# Patient Record
Sex: Female | Born: 1960 | Race: Black or African American | Hispanic: No | Smoking: Current some day smoker
Health system: Southern US, Community
[De-identification: ages and names within clinical notes are randomized; demographics above are authoritative.]

## PROBLEM LIST (undated history)

## (undated) DIAGNOSIS — G4730 Sleep apnea, unspecified: Secondary | ICD-10-CM

## (undated) DIAGNOSIS — R112 Nausea with vomiting, unspecified: Secondary | ICD-10-CM

## (undated) DIAGNOSIS — G43909 Migraine, unspecified, not intractable, without status migrainosus: Secondary | ICD-10-CM

## (undated) DIAGNOSIS — L959 Vasculitis limited to the skin, unspecified: Secondary | ICD-10-CM

## (undated) DIAGNOSIS — I10 Essential (primary) hypertension: Secondary | ICD-10-CM

## (undated) DIAGNOSIS — Z992 Dependence on renal dialysis: Secondary | ICD-10-CM

## (undated) DIAGNOSIS — I677 Cerebral arteritis, not elsewhere classified: Secondary | ICD-10-CM

## (undated) DIAGNOSIS — A159 Respiratory tuberculosis unspecified: Secondary | ICD-10-CM

## (undated) DIAGNOSIS — G459 Transient cerebral ischemic attack, unspecified: Secondary | ICD-10-CM

## (undated) DIAGNOSIS — R569 Unspecified convulsions: Secondary | ICD-10-CM

## (undated) DIAGNOSIS — J449 Chronic obstructive pulmonary disease, unspecified: Secondary | ICD-10-CM

## (undated) DIAGNOSIS — L0390 Cellulitis, unspecified: Secondary | ICD-10-CM

## (undated) DIAGNOSIS — N809 Endometriosis, unspecified: Secondary | ICD-10-CM

## (undated) DIAGNOSIS — M1990 Unspecified osteoarthritis, unspecified site: Secondary | ICD-10-CM

## (undated) DIAGNOSIS — M329 Systemic lupus erythematosus, unspecified: Secondary | ICD-10-CM

## (undated) DIAGNOSIS — N186 End stage renal disease: Secondary | ICD-10-CM

## (undated) DIAGNOSIS — A419 Sepsis, unspecified organism: Secondary | ICD-10-CM

## (undated) DIAGNOSIS — D649 Anemia, unspecified: Secondary | ICD-10-CM

## (undated) DIAGNOSIS — IMO0002 Reserved for concepts with insufficient information to code with codable children: Secondary | ICD-10-CM

## (undated) DIAGNOSIS — I469 Cardiac arrest, cause unspecified: Secondary | ICD-10-CM

## (undated) DIAGNOSIS — R768 Other specified abnormal immunological findings in serum: Secondary | ICD-10-CM

## (undated) DIAGNOSIS — T1491XA Suicide attempt, initial encounter: Secondary | ICD-10-CM

## (undated) DIAGNOSIS — Z9889 Other specified postprocedural states: Secondary | ICD-10-CM

## (undated) DIAGNOSIS — K8020 Calculus of gallbladder without cholecystitis without obstruction: Secondary | ICD-10-CM

## (undated) HISTORY — PX: TUBAL LIGATION: SHX77

## (undated) HISTORY — PX: LUNG BIOPSY: SHX232

---

## 2012-05-31 DIAGNOSIS — Z79899 Other long term (current) drug therapy: Secondary | ICD-10-CM | POA: Insufficient documentation

## 2013-02-21 DIAGNOSIS — G63 Polyneuropathy in diseases classified elsewhere: Secondary | ICD-10-CM | POA: Insufficient documentation

## 2016-06-27 ENCOUNTER — Emergency Department (HOSPITAL_COMMUNITY): Payer: Medicare Other

## 2016-06-27 ENCOUNTER — Encounter (HOSPITAL_COMMUNITY): Payer: Self-pay | Admitting: Emergency Medicine

## 2016-06-27 ENCOUNTER — Inpatient Hospital Stay (HOSPITAL_COMMUNITY)
Admission: EM | Admit: 2016-06-27 | Discharge: 2016-06-29 | DRG: 546 | Disposition: A | Discharge status: Home / Self Care | Payer: Medicare Other | Attending: Family Medicine | Admitting: Family Medicine

## 2016-06-27 DIAGNOSIS — M329 Systemic lupus erythematosus, unspecified: Secondary | ICD-10-CM | POA: Diagnosis present

## 2016-06-27 DIAGNOSIS — K5900 Constipation, unspecified: Secondary | ICD-10-CM | POA: Diagnosis present

## 2016-06-27 DIAGNOSIS — Z79899 Other long term (current) drug therapy: Secondary | ICD-10-CM

## 2016-06-27 DIAGNOSIS — IMO0002 Reserved for concepts with insufficient information to code with codable children: Secondary | ICD-10-CM | POA: Diagnosis present

## 2016-06-27 DIAGNOSIS — E8809 Other disorders of plasma-protein metabolism, not elsewhere classified: Secondary | ICD-10-CM | POA: Diagnosis present

## 2016-06-27 DIAGNOSIS — F17210 Nicotine dependence, cigarettes, uncomplicated: Secondary | ICD-10-CM | POA: Diagnosis present

## 2016-06-27 DIAGNOSIS — Z9119 Patient's noncompliance with other medical treatment and regimen: Secondary | ICD-10-CM

## 2016-06-27 DIAGNOSIS — R109 Unspecified abdominal pain: Secondary | ICD-10-CM | POA: Diagnosis present

## 2016-06-27 DIAGNOSIS — R1084 Generalized abdominal pain: Secondary | ICD-10-CM | POA: Diagnosis present

## 2016-06-27 DIAGNOSIS — N83291 Other ovarian cyst, right side: Secondary | ICD-10-CM | POA: Diagnosis present

## 2016-06-27 DIAGNOSIS — M3214 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: Principal | ICD-10-CM | POA: Diagnosis present

## 2016-06-27 DIAGNOSIS — D649 Anemia, unspecified: Secondary | ICD-10-CM | POA: Diagnosis present

## 2016-06-27 DIAGNOSIS — K429 Umbilical hernia without obstruction or gangrene: Secondary | ICD-10-CM | POA: Diagnosis present

## 2016-06-27 DIAGNOSIS — E871 Hypo-osmolality and hyponatremia: Secondary | ICD-10-CM | POA: Diagnosis present

## 2016-06-27 DIAGNOSIS — Z7952 Long term (current) use of systemic steroids: Secondary | ICD-10-CM

## 2016-06-27 DIAGNOSIS — I160 Hypertensive urgency: Secondary | ICD-10-CM | POA: Diagnosis present

## 2016-06-27 DIAGNOSIS — N289 Disorder of kidney and ureter, unspecified: Secondary | ICD-10-CM

## 2016-06-27 DIAGNOSIS — K219 Gastro-esophageal reflux disease without esophagitis: Secondary | ICD-10-CM | POA: Diagnosis present

## 2016-06-27 DIAGNOSIS — I10 Essential (primary) hypertension: Secondary | ICD-10-CM | POA: Diagnosis present

## 2016-06-27 DIAGNOSIS — Z9114 Patient's other noncompliance with medication regimen: Secondary | ICD-10-CM

## 2016-06-27 DIAGNOSIS — E872 Acidosis: Secondary | ICD-10-CM | POA: Diagnosis present

## 2016-06-27 DIAGNOSIS — D638 Anemia in other chronic diseases classified elsewhere: Secondary | ICD-10-CM | POA: Diagnosis present

## 2016-06-27 DIAGNOSIS — Z91199 Patient's noncompliance with other medical treatment and regimen due to unspecified reason: Secondary | ICD-10-CM

## 2016-06-27 DIAGNOSIS — Z8673 Personal history of transient ischemic attack (TIA), and cerebral infarction without residual deficits: Secondary | ICD-10-CM

## 2016-06-27 HISTORY — DX: Systemic lupus erythematosus, unspecified: M32.9

## 2016-06-27 HISTORY — DX: Transient cerebral ischemic attack, unspecified: G45.9

## 2016-06-27 HISTORY — DX: Essential (primary) hypertension: I10

## 2016-06-27 HISTORY — DX: Reserved for concepts with insufficient information to code with codable children: IMO0002

## 2016-06-27 LAB — COMPREHENSIVE METABOLIC PANEL
ALK PHOS: 49 U/L (ref 38–126)
ALT: 5 U/L — AB (ref 14–54)
AST: 17 U/L (ref 15–41)
Albumin: 2.3 g/dL — ABNORMAL LOW (ref 3.5–5.0)
Anion gap: 9 (ref 5–15)
BILIRUBIN TOTAL: 0.5 mg/dL (ref 0.3–1.2)
BUN: 18 mg/dL (ref 6–20)
CALCIUM: 7.7 mg/dL — AB (ref 8.9–10.3)
CO2: 18 mmol/L — ABNORMAL LOW (ref 22–32)
CREATININE: 2.63 mg/dL — AB (ref 0.44–1.00)
Chloride: 107 mmol/L (ref 101–111)
GFR calc Af Amer: 22 mL/min — ABNORMAL LOW (ref >60)
GFR, EST NON AFRICAN AMERICAN: 19 mL/min — AB (ref >60)
Glucose, Bld: 89 mg/dL (ref 65–99)
Potassium: 4.5 mmol/L (ref 3.5–5.1)
Sodium: 134 mmol/L — ABNORMAL LOW (ref 135–145)
TOTAL PROTEIN: 5.8 g/dL — AB (ref 6.5–8.1)

## 2016-06-27 LAB — URINALYSIS, ROUTINE W REFLEX MICROSCOPIC
Bilirubin Urine: NEGATIVE
Glucose, UA: NEGATIVE mg/dL
HGB URINE DIPSTICK: NEGATIVE
Ketones, ur: NEGATIVE mg/dL
Leukocytes, UA: NEGATIVE
Nitrite: NEGATIVE
Protein, ur: 30 mg/dL — AB
SPECIFIC GRAVITY, URINE: 1.006 (ref 1.005–1.030)
pH: 6 (ref 5.0–8.0)

## 2016-06-27 LAB — I-STAT BETA HCG BLOOD, ED (MC, WL, AP ONLY): I-stat hCG, quantitative: <5 m[IU]/mL (ref <5)

## 2016-06-27 LAB — CBC WITH DIFFERENTIAL/PLATELET
BASOS PCT: 0 %
Basophils Absolute: 0 10*3/uL (ref 0.0–0.1)
EOS PCT: 3 %
Eosinophils Absolute: 0.2 10*3/uL (ref 0.0–0.7)
HCT: 21.2 % — ABNORMAL LOW (ref 36.0–46.0)
Hemoglobin: 6.8 g/dL — CL (ref 12.0–15.0)
Lymphocytes Relative: 19 %
Lymphs Abs: 1.4 10*3/uL (ref 0.7–4.0)
MCH: 27.3 pg (ref 26.0–34.0)
MCHC: 32.1 g/dL (ref 30.0–36.0)
MCV: 85.1 fL (ref 78.0–100.0)
Monocytes Absolute: 0.5 10*3/uL (ref 0.1–1.0)
Monocytes Relative: 7 %
Neutro Abs: 5 10*3/uL (ref 1.7–7.7)
Neutrophils Relative %: 70 %
PLATELETS: 378 10*3/uL (ref 150–400)
RBC: 2.49 MIL/uL — ABNORMAL LOW (ref 3.87–5.11)
RDW: 14.8 % (ref 11.5–15.5)
WBC: 7.2 10*3/uL (ref 4.0–10.5)

## 2016-06-27 LAB — CBC
HEMATOCRIT: 21.8 % — AB (ref 36.0–46.0)
HEMOGLOBIN: 6.8 g/dL — AB (ref 12.0–15.0)
MCH: 26.5 pg (ref 26.0–34.0)
MCHC: 31.2 g/dL (ref 30.0–36.0)
MCV: 84.8 fL (ref 78.0–100.0)
Platelets: 419 10*3/uL — ABNORMAL HIGH (ref 150–400)
RBC: 2.57 MIL/uL — AB (ref 3.87–5.11)
RDW: 14.6 % (ref 11.5–15.5)
WBC: 8 10*3/uL (ref 4.0–10.5)

## 2016-06-27 LAB — POC OCCULT BLOOD, ED
FECAL OCCULT BLD: NEGATIVE
Fecal Occult Bld: NEGATIVE

## 2016-06-27 LAB — LIPASE, BLOOD: Lipase: 35 U/L (ref 11–51)

## 2016-06-27 LAB — PREPARE RBC (CROSSMATCH)

## 2016-06-27 MED ORDER — IOPAMIDOL (ISOVUE-300) INJECTION 61%
INTRAVENOUS | Status: AC
  Filled 2016-06-27: qty 50

## 2016-06-27 MED ORDER — HYDROMORPHONE HCL 2 MG/ML IJ SOLN
0.5000 mg | INTRAMUSCULAR | Status: DC | PRN
  Administered 2016-06-27: 0.5 mg via INTRAVENOUS
  Filled 2016-06-27: qty 1

## 2016-06-27 MED ORDER — ONDANSETRON HCL 4 MG/2ML IJ SOLN
4.0000 mg | Freq: Once | INTRAMUSCULAR | Status: AC
  Administered 2016-06-27: 4 mg via INTRAVENOUS
  Filled 2016-06-27: qty 2

## 2016-06-27 MED ORDER — IOPAMIDOL (ISOVUE-300) INJECTION 61%
INTRAVENOUS | Status: AC
  Filled 2016-06-27: qty 100

## 2016-06-27 MED ORDER — SODIUM CHLORIDE 0.9 % IV SOLN
INTRAVENOUS | Status: DC
  Administered 2016-06-27: 21:00:00 via INTRAVENOUS

## 2016-06-27 MED ORDER — SODIUM CHLORIDE 0.9 % IV BOLUS (SEPSIS)
1000.0000 mL | Freq: Once | INTRAVENOUS | Status: AC
  Administered 2016-06-27: 1000 mL via INTRAVENOUS

## 2016-06-27 MED ORDER — LABETALOL HCL 5 MG/ML IV SOLN
10.0000 mg | Freq: Once | INTRAVENOUS | Status: AC
  Administered 2016-06-28: 10 mg via INTRAVENOUS
  Filled 2016-06-27: qty 4

## 2016-06-27 NOTE — ED Provider Notes (Signed)
[facility] DEPT Provider Note   CSN: [id number] Arrival date & time: 06/27/16  1904     History   Chief Complaint Chief Complaint  Patient presents with  . Abdominal Pain    HPI Kathleen [name] is a 56 y.o. female.  HPI Patient presents to the emergency room with complaints of abdominal pain that started a few days ago. Patient initially noticed symptoms associated with constipation. She has not had a bowel movement since Thursday. She's had trouble with constipation in the past and last week she had to take magnesium citrate. She has not tried anything for her constipation this week.  Her abdominal pain is diffuse. It is increasing intensity. She is concerned she could've a blockage. She did have an episode of nausea and vomiting the other day but none today. She denies any dysuria. She denies any chest pain or shortness of breath.  She does have history of lupus and hypertension. She has not taken any of her blood pressure medications and a long period of time. She was having difficulty with getting them filled so she stopped taking them Past Medical History:  Diagnosis Date  . Hypertension   . Lupus   . Transient ischemic attack (TIA)     There are no active problems to display for this patient.   History reviewed. No pertinent surgical history.  OB History    No data available       Home Medications    Prior to Admission medications   Medication Sig Start Date End Date Taking? Authorizing Provider  cholecalciferol (VITAMIN D) 1000 units tablet Take 1,000 Units by mouth daily.    Historical Provider, MD  felodipine (PLENDIL) 5 MG 24 hr tablet Take 10 mg by mouth daily.    Historical Provider, MD  guaifenesin (HUMIBID E) 400 MG TABS tablet Take 400 mg by mouth 2 (two) times daily.    Historical Provider, MD  hydroxychloroquine (PLAQUENIL) 200 MG tablet Take 300 mg by mouth daily.    Historical Provider, MD  lisinopril (PRINIVIL,ZESTRIL) 20 MG tablet Take 20 mg by  mouth daily.    Historical Provider, MD  lisinopril (PRINIVIL,ZESTRIL) 5 MG tablet Take 10 mg by mouth daily.    Historical Provider, MD  mycophenolate (CELLCEPT) 500 MG tablet Take 1,000 mg by mouth 2 (two) times daily.    Historical Provider, MD  pantoprazole (PROTONIX) 40 MG tablet Take 40 mg by mouth 2 (two) times daily.    Historical Provider, MD  predniSONE (DELTASONE) 20 MG tablet Take 60 mg by mouth daily with breakfast. Take three tablets by mouth daily until rheumatology follow up.    Historical Provider, MD  sodium bicarbonate 650 MG tablet Take 650 mg by mouth daily.    Historical Provider, MD  vitamin C (ASCORBIC ACID) 500 MG tablet Take 500 mg by mouth daily.    Historical Provider, MD    Family History No family history on file.  Social History Social History  Substance Use Topics  . Smoking status: Current Some Day Smoker    Packs/day: 0.50    Types: Cigarettes  . Smokeless tobacco: Never Used  . Alcohol use No     Allergies   Patient has no known allergies.   Review of Systems Review of Systems  Constitutional: Negative for fever.  Gastrointestinal: Positive for abdominal pain, constipation and vomiting.  Neurological: Positive for weakness and headaches.     Physical Exam Updated Vital Signs BP (!) 171/103   Pulse 74  Temp 98.5 F (36.9 C) (Oral)   Resp 17   SpO2 100%   Physical Exam  Constitutional: No distress.  HENT:  Head: Normocephalic and atraumatic.  Right Ear: External ear normal.  Left Ear: External ear normal.  Eyes: Conjunctivae are normal. Right eye exhibits no discharge. Left eye exhibits no discharge. No scleral icterus.  Neck: Neck supple. No tracheal deviation present.  Cardiovascular: Normal rate, regular rhythm and intact distal pulses.   Pulmonary/Chest: Effort normal and breath sounds normal. No stridor. No respiratory distress. She has no wheezes. She has no rales.  Abdominal: Soft. Bowel sounds are normal. She exhibits no  distension and no mass. There is generalized tenderness. There is no rebound and no guarding. No hernia.  Musculoskeletal: She exhibits no edema or tenderness.  Neurological: She is alert. She has normal strength. No cranial nerve deficit (no facial droop, extraocular movements intact, no slurred speech) or sensory deficit. She exhibits normal muscle tone. She displays no seizure activity. Coordination normal.  Skin: Skin is warm and dry. No rash noted. She is not diaphoretic.  Psychiatric: She has a normal mood and affect.  Nursing note and vitals reviewed.    ED Treatments / Results  Labs (all labs ordered are listed, but only abnormal results are displayed) Labs Reviewed  COMPREHENSIVE METABOLIC PANEL - Abnormal; Notable for the following:       Result Value   Sodium 134 (*)    CO2 18 (*)    Creatinine, Ser 2.63 (*)    Calcium 7.7 (*)    Total Protein 5.8 (*)    Albumin 2.3 (*)    ALT 5 (*)    GFR calc non Af Amer 19 (*)    GFR calc Af Amer 22 (*)    All other components within normal limits  CBC WITH DIFFERENTIAL/PLATELET - Abnormal; Notable for the following:    RBC 2.49 (*)    Hemoglobin 6.8 (*)    HCT 21.2 (*)    All other components within normal limits  URINALYSIS, ROUTINE W REFLEX MICROSCOPIC - Abnormal; Notable for the following:    Color, Urine STRAW (*)    Protein, ur 30 (*)    Bacteria, UA RARE (*)    Squamous Epithelial / LPF 0-5 (*)    All other components within normal limits  CBC - Abnormal; Notable for the following:    RBC 2.57 (*)    Hemoglobin 6.8 (*)    HCT 21.8 (*)    Platelets 419 (*)    All other components within normal limits  LIPASE, BLOOD  I-STAT BETA HCG BLOOD, ED ([facility], [facility], [facility] ONLY)  POC OCCULT BLOOD, ED  POC OCCULT BLOOD, ED  TYPE AND SCREEN  PREPARE RBC (CROSSMATCH)  ABO/RH     Radiology Ct Abdomen Pelvis Wo Contrast  Result Date: 06/27/2016 CLINICAL DATA:  Diffuse abdominal pain with persistent vomiting EXAM: CT ABDOMEN AND  PELVIS WITHOUT CONTRAST TECHNIQUE: Multidetector CT imaging of the abdomen and pelvis was performed following the standard protocol without IV contrast. COMPARISON:  None. FINDINGS: Lower chest: Bleb in the right lower lobe. No acute consolidation or effusion. Heart size appears slightly enlarged. Hepatobiliary: No focal hepatic abnormality. Calcified stones in the gallbladder. No wall thickening. No biliary dilatation. Pancreas: Unremarkable. No pancreatic ductal dilatation or surrounding inflammatory changes. Spleen: Normal in size without focal abnormality. Adrenals/Urinary Tract: Adrenal glands are within normal limits. No hydronephrosis. Probable areas of cortical scarring in the right kidney. Hyperdensities in the  left kidney, some of which appear to be within the medullary portion, suggestive of nephrocalcinosis. Others may represent parenchymal calcifications within the left kidney. No ureteral stones. Bladder normal Stomach/Bowel: Stomach is nonenlarged. No dilated small bowel. No colon wall thickening. Increased density within the lumen of the appendix but no inflammatory changes. Vascular/Lymphatic: Calcified aorta. No grossly enlarged lymph nodes Reproductive: Uterus grossly unremarkable. 2.6 cm rim calcified cyst in the right ovary. Other: No free air or free fluid.  Fat containing umbilical hernia. Musculoskeletal: No acute or suspicious bone lesions. IMPRESSION: 1. No CT evidence for a bowel obstruction. Appendicolith but no evidence for appendicitis. 2. 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 Others may represent parenchymal calcifications. Suspect cortical scarring in the lower pole of the right kidney. 3. 2.6 cm rim calcified cyst in the right ovary ; ultrasound may be obtained for further evaluation. Electronically Signed   By: Donavan Foil M.D.   On: 06/27/2016 23:45    Procedures Procedures (including critical care  time)  Medications Ordered in ED Medications  sodium chloride 0.9 % bolus 1,000 mL (0 mLs Intravenous Stopped 06/27/16 2200)    And  0.9 %  sodium chloride infusion ( Intravenous New Bag/Given 06/27/16 2119)  HYDROmorphone (DILAUDID) injection 0.5 mg (0.5 mg Intravenous Given 06/27/16 1953)  iopamidol (ISOVUE-300) 61 % injection (not administered)  iopamidol (ISOVUE-300) 61 % injection (not administered)  labetalol (NORMODYNE,TRANDATE) injection 10 mg (not administered)  ondansetron (ZOFRAN) injection 4 mg (4 mg Intravenous Given 06/27/16 1951)  ondansetron (ZOFRAN) injection 4 mg (4 mg Intravenous Given 06/27/16 2142)     Initial Impression / Assessment and Plan / ED Course  I have reviewed the triage vital signs and the nursing notes.  Pertinent labs & imaging results that were available during my care of the patient were reviewed by me and considered in my medical decision making (see chart for details).  Clinical Course as of Jun 27 2356  Nancy Fetter Jun 27, 2016  2038 Notified of low hemoglobin.  Will repeat, send off stool guiac.  Send off type and screen  [JK]  2209 Attempted to review records from care everywhere.  None available  [JK]    Clinical Course User Index [JK] Dorie Rank, MD    Pt presented to the ED with abdominal pain, nausea, vomiting, constipation.   She has a history of lupus and hypertension but has not been taking any of her medications for a long period of time. Her laboratory tests showed renal insufficiency with a creatinine of 2.63 as well as hemoglobin down to 6.8.  No signs of GI bleed I did order a blood transfusion.  CT was performed to evaluate for obstruction or other acute abdominal process. No acute findings on CT were noted to account for abdominal pain.  I will consult the medical service for admission  Final Clinical Impressions(s) / ED Diagnoses   Final diagnoses:  Anemia, unspecified type  Renal insufficiency  Hypertension, unspecified type       Dorie Rank, MD 06/27/16 2358

## 2016-06-27 NOTE — ED Notes (Signed)
Patient transported to CT 

## 2016-06-27 NOTE — ED Notes (Signed)
Lab Critical:  Hgb 6.8  Provider notified

## 2016-06-27 NOTE — ED Notes (Signed)
Pt vomiting contrast

## 2016-06-27 NOTE — ED Triage Notes (Signed)
Per EMS pt c/o nausea, headache, generalized weakness for 2 weeks. Stopped taking medications for 2 weeks, pt is hypertensive

## 2016-06-28 ENCOUNTER — Encounter (HOSPITAL_COMMUNITY): Payer: Self-pay | Admitting: Family Medicine

## 2016-06-28 DIAGNOSIS — E8809 Other disorders of plasma-protein metabolism, not elsewhere classified: Secondary | ICD-10-CM | POA: Diagnosis present

## 2016-06-28 DIAGNOSIS — K219 Gastro-esophageal reflux disease without esophagitis: Secondary | ICD-10-CM | POA: Diagnosis present

## 2016-06-28 DIAGNOSIS — N289 Disorder of kidney and ureter, unspecified: Secondary | ICD-10-CM | POA: Diagnosis present

## 2016-06-28 DIAGNOSIS — Z8673 Personal history of transient ischemic attack (TIA), and cerebral infarction without residual deficits: Secondary | ICD-10-CM | POA: Diagnosis not present

## 2016-06-28 DIAGNOSIS — I160 Hypertensive urgency: Secondary | ICD-10-CM | POA: Diagnosis present

## 2016-06-28 DIAGNOSIS — Z9114 Patient's other noncompliance with medication regimen: Secondary | ICD-10-CM | POA: Diagnosis not present

## 2016-06-28 DIAGNOSIS — K429 Umbilical hernia without obstruction or gangrene: Secondary | ICD-10-CM | POA: Diagnosis present

## 2016-06-28 DIAGNOSIS — E872 Acidosis: Secondary | ICD-10-CM | POA: Diagnosis present

## 2016-06-28 DIAGNOSIS — D649 Anemia, unspecified: Secondary | ICD-10-CM | POA: Diagnosis present

## 2016-06-28 DIAGNOSIS — K5900 Constipation, unspecified: Secondary | ICD-10-CM | POA: Diagnosis present

## 2016-06-28 DIAGNOSIS — M3214 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: Secondary | ICD-10-CM | POA: Diagnosis present

## 2016-06-28 DIAGNOSIS — R109 Unspecified abdominal pain: Secondary | ICD-10-CM | POA: Diagnosis present

## 2016-06-28 DIAGNOSIS — Z79899 Other long term (current) drug therapy: Secondary | ICD-10-CM | POA: Diagnosis not present

## 2016-06-28 DIAGNOSIS — F17210 Nicotine dependence, cigarettes, uncomplicated: Secondary | ICD-10-CM | POA: Diagnosis present

## 2016-06-28 DIAGNOSIS — Z91199 Patient's noncompliance with other medical treatment and regimen due to unspecified reason: Secondary | ICD-10-CM

## 2016-06-28 DIAGNOSIS — I10 Essential (primary) hypertension: Secondary | ICD-10-CM | POA: Diagnosis present

## 2016-06-28 DIAGNOSIS — Z9119 Patient's noncompliance with other medical treatment and regimen: Secondary | ICD-10-CM

## 2016-06-28 DIAGNOSIS — R1084 Generalized abdominal pain: Secondary | ICD-10-CM | POA: Diagnosis present

## 2016-06-28 DIAGNOSIS — Z7952 Long term (current) use of systemic steroids: Secondary | ICD-10-CM | POA: Diagnosis not present

## 2016-06-28 DIAGNOSIS — N83291 Other ovarian cyst, right side: Secondary | ICD-10-CM | POA: Diagnosis present

## 2016-06-28 DIAGNOSIS — M329 Systemic lupus erythematosus, unspecified: Secondary | ICD-10-CM | POA: Diagnosis present

## 2016-06-28 DIAGNOSIS — E871 Hypo-osmolality and hyponatremia: Secondary | ICD-10-CM | POA: Diagnosis present

## 2016-06-28 DIAGNOSIS — D638 Anemia in other chronic diseases classified elsewhere: Secondary | ICD-10-CM | POA: Diagnosis present

## 2016-06-28 LAB — PROTEIN / CREATININE RATIO, URINE
CREATININE, URINE: 48.04 mg/dL
Protein Creatinine Ratio: 1.42 mg/mg{Cre} — ABNORMAL HIGH (ref 0.00–0.15)
TOTAL PROTEIN, URINE: 68 mg/dL

## 2016-06-28 LAB — COMPREHENSIVE METABOLIC PANEL
ALT: <5 U/L — ABNORMAL LOW (ref 14–54)
ANION GAP: 6 (ref 5–15)
AST: 14 U/L — ABNORMAL LOW (ref 15–41)
Albumin: 1.8 g/dL — ABNORMAL LOW (ref 3.5–5.0)
Alkaline Phosphatase: 36 U/L — ABNORMAL LOW (ref 38–126)
BUN: 15 mg/dL (ref 6–20)
CHLORIDE: 119 mmol/L — AB (ref 101–111)
CO2: 15 mmol/L — AB (ref 22–32)
Calcium: 6.1 mg/dL — CL (ref 8.9–10.3)
Creatinine, Ser: 1.98 mg/dL — ABNORMAL HIGH (ref 0.44–1.00)
GFR calc non Af Amer: 27 mL/min — ABNORMAL LOW (ref >60)
GFR, EST AFRICAN AMERICAN: 32 mL/min — AB (ref >60)
Glucose, Bld: 73 mg/dL (ref 65–99)
Potassium: 3.5 mmol/L (ref 3.5–5.1)
SODIUM: 140 mmol/L (ref 135–145)
Total Bilirubin: 0.4 mg/dL (ref 0.3–1.2)
Total Protein: 4.4 g/dL — ABNORMAL LOW (ref 6.5–8.1)

## 2016-06-28 LAB — CBC
HCT: 25.8 % — ABNORMAL LOW (ref 36.0–46.0)
Hemoglobin: 8.3 g/dL — ABNORMAL LOW (ref 12.0–15.0)
MCH: 27.1 pg (ref 26.0–34.0)
MCHC: 32.2 g/dL (ref 30.0–36.0)
MCV: 84.3 fL (ref 78.0–100.0)
PLATELETS: 374 10*3/uL (ref 150–400)
RBC: 3.06 MIL/uL — AB (ref 3.87–5.11)
RDW: 14.3 % (ref 11.5–15.5)
WBC: 5.9 10*3/uL (ref 4.0–10.5)

## 2016-06-28 LAB — BASIC METABOLIC PANEL
ANION GAP: 10 (ref 5–15)
BUN: 20 mg/dL (ref 6–20)
CALCIUM: 8.9 mg/dL (ref 8.9–10.3)
CO2: 17 mmol/L — ABNORMAL LOW (ref 22–32)
Chloride: 109 mmol/L (ref 101–111)
Creatinine, Ser: 2.88 mg/dL — ABNORMAL HIGH (ref 0.44–1.00)
GFR, EST AFRICAN AMERICAN: 20 mL/min — AB (ref >60)
GFR, EST NON AFRICAN AMERICAN: 17 mL/min — AB (ref >60)
Glucose, Bld: 157 mg/dL — ABNORMAL HIGH (ref 65–99)
Potassium: 5.2 mmol/L — ABNORMAL HIGH (ref 3.5–5.1)
Sodium: 136 mmol/L (ref 135–145)

## 2016-06-28 LAB — MAGNESIUM
Magnesium: 1.5 mg/dL — ABNORMAL LOW (ref 1.7–2.4)
Magnesium: 2.6 mg/dL — ABNORMAL HIGH (ref 1.7–2.4)

## 2016-06-28 LAB — TYPE AND SCREEN
BLOOD PRODUCT EXPIRATION DATE: 201803092359
ISSUE DATE / TIME: 201802182245
UNIT TYPE AND RH: 7300

## 2016-06-28 LAB — C-REACTIVE PROTEIN: CRP: 3.3 mg/dL — AB (ref <1.0)

## 2016-06-28 LAB — ABO/RH: ABO/RH(D): B POS

## 2016-06-28 LAB — PROTIME-INR
INR: 1.42
Prothrombin Time: 17.5 seconds — ABNORMAL HIGH (ref 11.4–15.2)

## 2016-06-28 LAB — GLUCOSE, CAPILLARY
GLUCOSE-CAPILLARY: 121 mg/dL — AB (ref 65–99)
Glucose-Capillary: 78 mg/dL (ref 65–99)

## 2016-06-28 LAB — HIV ANTIBODY (ROUTINE TESTING W REFLEX): HIV SCREEN 4TH GENERATION: NONREACTIVE

## 2016-06-28 LAB — CREATININE, URINE, RANDOM: Creatinine, Urine: 48.46 mg/dL

## 2016-06-28 LAB — SODIUM, URINE, RANDOM: Sodium, Ur: 110 mmol/L

## 2016-06-28 LAB — SEDIMENTATION RATE: SED RATE: 37 mm/h — AB (ref 0–22)

## 2016-06-28 MED ORDER — FELODIPINE ER 10 MG PO TB24
10.0000 mg | ORAL_TABLET | Freq: Every day | ORAL | Status: DC
Start: 2016-06-28 — End: 2016-06-29
  Administered 2016-06-28 – 2016-06-29 (×2): 10 mg via ORAL
  Filled 2016-06-28 (×2): qty 1

## 2016-06-28 MED ORDER — SODIUM POLYSTYRENE SULFONATE 15 GM/60ML PO SUSP
30.0000 g | Freq: Once | ORAL | Status: AC
  Administered 2016-06-28: 30 g via ORAL
  Filled 2016-06-28: qty 120

## 2016-06-28 MED ORDER — SODIUM CHLORIDE 0.9% FLUSH
3.0000 mL | Freq: Two times a day (BID) | INTRAVENOUS | Status: DC
  Administered 2016-06-28 – 2016-06-29 (×4): 3 mL via INTRAVENOUS

## 2016-06-28 MED ORDER — HYDROCODONE-ACETAMINOPHEN 5-325 MG PO TABS
1.0000 | ORAL_TABLET | ORAL | Status: DC | PRN
  Administered 2016-06-28: 2 via ORAL
  Filled 2016-06-28: qty 2

## 2016-06-28 MED ORDER — PANTOPRAZOLE SODIUM 40 MG PO TBEC: 40.0000 mg | DELAYED_RELEASE_TABLET | Freq: Two times a day (BID) | ORAL | Status: DC

## 2016-06-28 MED ORDER — HYDROMORPHONE HCL 2 MG/ML IJ SOLN
0.5000 mg | INTRAMUSCULAR | Status: DC | PRN
Start: 2016-06-28 — End: 2016-06-28

## 2016-06-28 MED ORDER — HYDROXYCHLOROQUINE SULFATE 200 MG PO TABS
300.0000 mg | ORAL_TABLET | Freq: Every day | ORAL | Status: DC
  Administered 2016-06-28 – 2016-06-29 (×2): 300 mg via ORAL
  Filled 2016-06-28 (×2): qty 2

## 2016-06-28 MED ORDER — ACETAMINOPHEN 650 MG RE SUPP: 650.0000 mg | Freq: Four times a day (QID) | RECTAL | Status: DC | PRN

## 2016-06-28 MED ORDER — MYCOPHENOLATE MOFETIL 250 MG PO CAPS
1000.0000 mg | ORAL_CAPSULE | Freq: Two times a day (BID) | ORAL | Status: DC
  Administered 2016-06-28 – 2016-06-29 (×3): 1000 mg via ORAL
  Filled 2016-06-28 (×3): qty 4

## 2016-06-28 MED ORDER — METHYLPREDNISOLONE SODIUM SUCC 125 MG IJ SOLR
125.0000 mg | Freq: Once | INTRAMUSCULAR | Status: AC
  Administered 2016-06-28: 125 mg via INTRAVENOUS
  Filled 2016-06-28: qty 2

## 2016-06-28 MED ORDER — SODIUM CHLORIDE 0.9 % IV SOLN: Freq: Once | INTRAVENOUS | Status: DC

## 2016-06-28 MED ORDER — PANTOPRAZOLE SODIUM 40 MG IV SOLR
40.0000 mg | Freq: Two times a day (BID) | INTRAVENOUS | Status: DC
  Administered 2016-06-28 – 2016-06-29 (×4): 40 mg via INTRAVENOUS
  Filled 2016-06-28 (×4): qty 40

## 2016-06-28 MED ORDER — SODIUM CHLORIDE 0.9 % IV SOLN: INTRAVENOUS | Status: AC

## 2016-06-28 MED ORDER — ACETAMINOPHEN 325 MG PO TABS: 650.0000 mg | ORAL_TABLET | Freq: Four times a day (QID) | ORAL | Status: DC | PRN

## 2016-06-28 MED ORDER — POLYETHYLENE GLYCOL 3350 17 G PO PACK: 17.0000 g | PACK | Freq: Every day | ORAL | Status: DC | PRN

## 2016-06-28 MED ORDER — HYDROMORPHONE HCL 1 MG/ML IJ SOLN
0.5000 mg | INTRAMUSCULAR | Status: DC | PRN
  Administered 2016-06-29: 0.5 mg via INTRAVENOUS
  Filled 2016-06-28: qty 1

## 2016-06-28 MED ORDER — SODIUM CHLORIDE 0.9 % IV SOLN
1.0000 g | Freq: Once | INTRAVENOUS | Status: AC
  Administered 2016-06-28: 1 g via INTRAVENOUS
  Filled 2016-06-28: qty 10

## 2016-06-28 MED ORDER — BISACODYL 5 MG PO TBEC
5.0000 mg | DELAYED_RELEASE_TABLET | Freq: Every day | ORAL | Status: DC | PRN
  Filled 2016-06-28: qty 1

## 2016-06-28 MED ORDER — ONDANSETRON HCL 4 MG/2ML IJ SOLN: 4.0000 mg | Freq: Four times a day (QID) | INTRAMUSCULAR | Status: DC | PRN

## 2016-06-28 MED ORDER — LABETALOL HCL 5 MG/ML IV SOLN
10.0000 mg | INTRAVENOUS | Status: DC | PRN
  Administered 2016-06-28: 10 mg via INTRAVENOUS
  Filled 2016-06-28 (×2): qty 4

## 2016-06-28 MED ORDER — MAGNESIUM SULFATE 2 GM/50ML IV SOLN
2.0000 g | Freq: Once | INTRAVENOUS | Status: AC
  Administered 2016-06-28: 2 g via INTRAVENOUS
  Filled 2016-06-28: qty 50

## 2016-06-28 MED ORDER — ONDANSETRON HCL 4 MG PO TABS: 4.0000 mg | ORAL_TABLET | Freq: Four times a day (QID) | ORAL | Status: DC | PRN

## 2016-06-28 MED ORDER — SODIUM BICARBONATE 650 MG PO TABS
650.0000 mg | ORAL_TABLET | Freq: Every day | ORAL | Status: DC
  Administered 2016-06-28 – 2016-06-29 (×2): 650 mg via ORAL
  Filled 2016-06-28 (×2): qty 1

## 2016-06-28 NOTE — Progress Notes (Signed)
Pt has critical calcium 6.1. Dr. Maudie Mercury notified. Orders received.

## 2016-06-28 NOTE — Progress Notes (Signed)
Patient admitted earlier this morning. See H&P for details.   The patient notes 2 - 3 weeks of generalized weakness and recently started with umbilical abdominal pain where she's had a hernia for years. Due to weakness with ambulation a close friend brought her to the ED. Her appetite has been poor and she's had a couple episodes of vomiting. She was hypertensive on evaluation with SCr 2.63 and no available baseline. Hgb 6.8, FOBT neg. CT of the abdomen and pelvis was obtained and negative for SBO, though showed fat in umbilical hernia. 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 She's received 1u PRBCs with subject improvement in weakness and overall feeling of well-being. She also tearfully tells me of the recent passing of her mother, who was also her best friend, last month.   She last took medications for lupus about 8 months ago due to inability to afford medications, though she's unable to say what changed that she was previously able to afford medications.    On exam, she's chronically ill-appearing but pleasant with umbilical tenderness and very small reducible hernia.   Symptomatic anemia: Presumed subacute/chronic, though no priors for baseline she appears chronically ill with h/o transfusions related to nonhemorrhagic anemia. Hgb 6.8 on admission with normal MCV and negative FOBT  - Recheck CBC s/p 1u PRBCs - No anemia panel was obtained prior to transfusion, will obtain prior records (none seen in Care Everywhere at [facility].   Lupus: Followed at [facility], though can't recall name of provider. Has had this for 27 years.   - Prescribed Plaquenil, and CellCept but has not taken in months, will restart now.  - Serologies sent at admission and are pending.  - Will  continue IV steroid for 24 hours as po intake is not reliable.   - CM consult  Abdominal pain: Though different from admission, for me her exam and history indicate this is due to fat herniation into umbilical hernia which is reducible. No peritoneal signs or indications for surgical consultation on CT.   - Will continue to monitor. - Advance diet as tolerated.  Hypertensive urgency: Severe range HTN on presentation with renal impairment presumed to have acute component due to improvement since admission.  - Restart home felodipine, continue holding lisinopril due to renal impairment.  - Continue labetalol IV prn.  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   GERD: No EGD report on file - Continue PPI, ok to change to po  Electrolyte disturbances: Mild hypocalcemia (once corrected for hypoalbuminemia) and hypomagnesemia without hypokalemia.  - Replete Mg and Ca, recheck in PM   Vance Gather, MD 06/28/2016 11:25 AM

## 2016-06-28 NOTE — H&P (Signed)
History and Physical    Kathleen [name] HER:[id number] DOB: 1961-01-03 DOA: 06/27/2016  PCP: Pcp Not In System   Patient coming from: Home  Chief Complaint: Gen weakness, fatigue, abdominal pain   HPI: Kathleen [name] is a 56 y.o. female with medical history significant for hypertension, lupus, and GERD who presents to the emergency department with 2 weeks of progressive generalized weakness and nausea, and 3 days of progressive generalized abdominal pain. Patient has not taken any of her medications and months for unclear reasons and has not been following up with her providers. She was doing well initially until the insidious development of generalized weakness a couple weeks ago. Since that time, she has had progressive weakness, early fatigue, and over the past 3 days or so, has been experiencing generalized abdominal pain which has now become severe. She reports experiencing some constipation last week for which she took magnesium citrate. She had an episode of nausea with vomiting 2 days ago, but none since. She denies any melena or hematochezia and denies any hematemesis or hemoptysis. There has been no fevers or chills and no chest pain, palpitations, dyspnea, or cough. Patient denies dysuria or flank pain. She describes her abdominal pain as generalized, severe, constant, sharp, and with no alleviating or exacerbating factors identified. She has not experience similar pain previously.    ED Course: Upon arrival to the ED, patient is found to be afebrile, saturating well on room air, hypertensive to 170/110, and with vitals otherwise stable. Chemistry panels notable for mild hyponatremia and a serum creatinine of 2.63 with no prior for comparison. CBC is notable for hemoglobin of 6.8 with negative FOBT and no prior CBC on record. 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 One unit of packed red blood cells were ordered for immediate transfusion, the patient was given a 10 mg IV push of labetalol and 1 L of normal saline. Abdominal pain was treated with 0.5 mg IV Dilaudid. Patient developed some nausea and was treated with IV Zofran. Blood pressure has improved but remains quite elevated and the patient is not in any respiratory distress. She will be observed on the telemetry unit for ongoing evaluation and management of symptomatic anemia with severe abdominal pain and nonadherence with her antihypertensives and lupus medications.  Review of Systems:  All other systems reviewed and apart from HPI, are negative.  Past Medical History:  Diagnosis Date  . Hypertension   . Lupus   . Transient ischemic attack (TIA)     History reviewed. No pertinent surgical history.   reports that she has been smoking Cigarettes.  She has been smoking about 0.50 packs per day. She has never used smokeless tobacco. She reports that she does not drink alcohol or use drugs.  No Known Allergies  History reviewed. No pertinent family history.   Prior to Admission medications   Medication Sig Start Date End Date Taking? Authorizing Provider  cholecalciferol (VITAMIN D) 1000 units tablet Take 1,000 Units by mouth daily.    Historical Provider, MD  felodipine (PLENDIL) 5 MG 24 hr tablet Take 10 mg by mouth daily.    Historical Provider, MD  guaifenesin (HUMIBID E) 400 MG TABS tablet Take 400 mg by mouth  2 (two) times daily.    Historical Provider, MD  hydroxychloroquine (PLAQUENIL) 200 MG tablet Take 300 mg by mouth daily.    Historical Provider, MD  lisinopril (PRINIVIL,ZESTRIL) 20 MG tablet Take 20 mg by mouth daily.    Historical Provider, MD  lisinopril (PRINIVIL,ZESTRIL) 5 MG tablet Take 10 mg by mouth daily.     Historical Provider, MD  mycophenolate (CELLCEPT) 500 MG tablet Take 1,000 mg by mouth 2 (two) times daily.    Historical Provider, MD  pantoprazole (PROTONIX) 40 MG tablet Take 40 mg by mouth 2 (two) times daily.    Historical Provider, MD  predniSONE (DELTASONE) 20 MG tablet Take 60 mg by mouth daily with breakfast. Take three tablets by mouth daily until rheumatology follow up.    Historical Provider, MD  sodium bicarbonate 650 MG tablet Take 650 mg by mouth daily.    Historical Provider, MD  vitamin C (ASCORBIC ACID) 500 MG tablet Take 500 mg by mouth daily.    Historical Provider, MD    Physical Exam: Vitals:   06/28/16 0000 06/28/16 0008 06/28/16 0015 06/28/16 0024  BP: (!) 169/111  (!) 176/113 (!) 176/113  Pulse:  71  71  Resp: 17 16 16 15   Temp:    98.1 F (36.7 C)  TempSrc:    Oral  SpO2:  100%  100%      Constitutional: No acute distress, calm, comfortable, chronically-ill appearing Eyes: PERTLA, lids and conjunctivae normal ENMT: Mucous membranes are moist. Posterior pharynx clear of any exudate or lesions.   Neck: normal, supple, no masses, no thyromegaly Respiratory: clear to auscultation bilaterally, no wheezing, no crackles. Normal respiratory effort.   Cardiovascular: S1 & S2 heard, regular rate and rhythm. No extremity edema. No significant JVD. Abdomen: No distension, soft, generalized tenderness without rebound pain or guarding. No masses palpated. Bowel sounds active.  Musculoskeletal: no clubbing / cyanosis. No joint deformity upper and lower extremities.    Skin: no significant rashes, lesions, ulcers. Warm, dry, well-perfused. Neurologic: CN 2-12 grossly intact. Sensation intact, DTR normal. Strength 5/5 in all 4 limbs.  Psychiatric: Normal judgment and insight. Alert and oriented x 3. Normal mood and affect.     Labs on Admission: I have personally reviewed following labs and imaging studies  CBC:  Recent Labs Lab 06/27/16 1945 06/27/16 2111  WBC  7.2 8.0  NEUTROABS 5.0  --   HGB 6.8* 6.8*  HCT 21.2* 21.8*  MCV 85.1 84.8  PLT 378 671*   Basic Metabolic Panel:  Recent Labs Lab 06/27/16 1945  NA 134*  K 4.5  CL 107  CO2 18*  GLUCOSE 89  BUN 18  CREATININE 2.63*  CALCIUM 7.7*   GFR: CrCl cannot be calculated (Unknown ideal weight.). Liver Function Tests:  Recent Labs Lab 06/27/16 1945  AST 17  ALT 5*  ALKPHOS 49  BILITOT 0.5  PROT 5.8*  ALBUMIN 2.3*    Recent Labs Lab 06/27/16 1945  LIPASE 35   No results for input(s): AMMONIA in the last 168 hours. Coagulation Profile: No results for input(s): INR, PROTIME in the last 168 hours. Cardiac Enzymes: No results for input(s): CKTOTAL, CKMB, CKMBINDEX, TROPONINI in the last 168 hours. BNP (last 3 results) No results for input(s): PROBNP in the last 8760 hours. HbA1C: No results for input(s): HGBA1C in the last 72 hours. CBG: No results for input(s): GLUCAP in the last 168 hours. Lipid Profile: No results for input(s): CHOL, HDL, LDLCALC, TRIG, CHOLHDL, LDLDIRECT in  the last 72 hours. Thyroid Function Tests: No results for input(s): TSH, T4TOTAL, FREET4, T3FREE, THYROIDAB in the last 72 hours. Anemia Panel: No results for input(s): VITAMINB12, FOLATE, FERRITIN, TIBC, IRON, RETICCTPCT in the last 72 hours. Urine analysis:    Component Value Date/Time   COLORURINE STRAW (A) 06/27/2016 2111   APPEARANCEUR CLEAR 06/27/2016 2111   LABSPEC 1.006 06/27/2016 2111   PHURINE 6.0 06/27/2016 2111   GLUCOSEU NEGATIVE 06/27/2016 2111   HGBUR NEGATIVE 06/27/2016 2111   [place] NEGATIVE 06/27/2016 2111   [place] NEGATIVE 06/27/2016 2111   PROTEINUR 30 (A) 06/27/2016 2111   NITRITE NEGATIVE 06/27/2016 2111   LEUKOCYTESUR NEGATIVE 06/27/2016 2111   Sepsis Labs: @LABRCNTIP (procalcitonin:4,lacticidven:4) )No results found for this or any previous visit (from the past 240 hour(s)).   Radiological Exams on Admission: Ct Abdomen Pelvis Wo Contrast  Result  Date: 06/27/2016 CLINICAL DATA:  Diffuse abdominal pain with persistent vomiting EXAM: CT ABDOMEN AND PELVIS WITHOUT CONTRAST TECHNIQUE: Multidetector CT imaging of the abdomen and pelvis was performed following the standard protocol without IV contrast. COMPARISON:  None. FINDINGS: Lower chest: Bleb in the right lower lobe. No acute consolidation or effusion. Heart size appears slightly enlarged. Hepatobiliary: No focal hepatic abnormality. Calcified stones in the gallbladder. No wall thickening. No biliary dilatation. Pancreas: Unremarkable. No pancreatic ductal dilatation or surrounding inflammatory changes. Spleen: Normal in size without focal abnormality. Adrenals/Urinary Tract: Adrenal glands are within normal limits. No hydronephrosis. Probable areas of cortical scarring in the right kidney. Hyperdensities in the left kidney, some of which appear to be within the medullary portion, suggestive of nephrocalcinosis. Others may represent parenchymal calcifications within the left kidney. No ureteral stones. Bladder normal Stomach/Bowel: Stomach is nonenlarged. No dilated small bowel. No colon wall thickening. Increased density within the lumen of the appendix but no inflammatory changes. Vascular/Lymphatic: Calcified aorta. No grossly enlarged lymph nodes Reproductive: Uterus grossly unremarkable. 2.6 cm rim calcified cyst in the right ovary. Other: No free air or free fluid.  Fat containing umbilical hernia. Musculoskeletal: No acute or suspicious bone lesions. IMPRESSION: 1. No CT evidence for a bowel obstruction. Appendicolith but no evidence for appendicitis. 2. 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 Others may represent parenchymal calcifications. Suspect cortical scarring in the lower pole of the right kidney. 3. 2.6 cm rim calcified cyst in the right ovary ; ultrasound may be obtained for further evaluation. Electronically Signed   By: Donavan Foil M.D.   On: 06/27/2016 23:45    EKG: Ordered and pending.   Assessment/Plan  1. Symptomatic anemia  - Hgb 6.8 on admission with normal MCV and negative FOBT  - Pt reports hx of remote transfusion, but medical records not currently availble - She denies melena, hematochezia, bruising or bleeding  - 1 unit pRBC transfusing in ED  - Check anemia panel, obtain prior records, check post-transfusion CBC  - Given her hx of GERD and current abdominal pain, will treat with Protonix for now   2. Generalized abdominal pain  - No peritoneal signs and CT abd/pelvis without acute finding to explain - Possibly secondary to SLE as she has gone months without her medication  - Check lupus serologies, given a dose of Solu-Medrol 125 mg IV on admission  - Continue prn analgesia   3. Hypertension with hypertensive urgency  - Pt with hx of HTN, prescribed felodipine and lisinopril, but has not taken in months  - She has remained asymptomatic with this  and will be treated with labetalol IVP's for now    4. Lupus  - Prescribed Plaquenil, and CellCept but has not taken in months  - Concerned for acute involvement as above  - Check serologies; given one dose IV Solu-Medrol 125 mg    5. 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 GERD - No EGD report on file - Managed with Protonix at home - Given symptomatic anemia with abdominal pain and GERD hx, will treat with IV Protonix 40 mg q12h for now as above    DVT prophylaxis: SCD's Code Status: Full  Family Communication: Significant other updated at bedside with  patient's permission Disposition Plan: Observe on telemetry Consults called: None Admission status: Observation    Vianne Bulls, MD [facility] Pager [phone]  If 7PM-7AM, please contact night-coverage [email or web address] Password [facility]  06/28/2016, 12:32 AM

## 2016-06-29 DIAGNOSIS — I160 Hypertensive urgency: Secondary | ICD-10-CM

## 2016-06-29 DIAGNOSIS — D649 Anemia, unspecified: Secondary | ICD-10-CM

## 2016-06-29 DIAGNOSIS — N289 Disorder of kidney and ureter, unspecified: Secondary | ICD-10-CM

## 2016-06-29 DIAGNOSIS — M329 Systemic lupus erythematosus, unspecified: Secondary | ICD-10-CM

## 2016-06-29 LAB — BASIC METABOLIC PANEL
Anion gap: 10 (ref 5–15)
BUN: 26 mg/dL — AB (ref 6–20)
CO2: 21 mmol/L — ABNORMAL LOW (ref 22–32)
Calcium: 9.4 mg/dL (ref 8.9–10.3)
Chloride: 108 mmol/L (ref 101–111)
Creatinine, Ser: 2.81 mg/dL — ABNORMAL HIGH (ref 0.44–1.00)
GFR calc Af Amer: 21 mL/min — ABNORMAL LOW (ref >60)
GFR, EST NON AFRICAN AMERICAN: 18 mL/min — AB (ref >60)
GLUCOSE: 105 mg/dL — AB (ref 65–99)
POTASSIUM: 4.9 mmol/L (ref 3.5–5.1)
Sodium: 139 mmol/L (ref 135–145)

## 2016-06-29 LAB — GLUCOSE, CAPILLARY: Glucose-Capillary: 114 mg/dL — ABNORMAL HIGH (ref 65–99)

## 2016-06-29 LAB — CBC
HEMATOCRIT: 27 % — AB (ref 36.0–46.0)
Hemoglobin: 8.7 g/dL — ABNORMAL LOW (ref 12.0–15.0)
MCH: 27.3 pg (ref 26.0–34.0)
MCHC: 32.2 g/dL (ref 30.0–36.0)
MCV: 84.6 fL (ref 78.0–100.0)
Platelets: 466 10*3/uL — ABNORMAL HIGH (ref 150–400)
RBC: 3.19 MIL/uL — ABNORMAL LOW (ref 3.87–5.11)
RDW: 14.4 % (ref 11.5–15.5)
WBC: 13.8 10*3/uL — ABNORMAL HIGH (ref 4.0–10.5)

## 2016-06-29 LAB — MPO/PR-3 (ANCA) ANTIBODIES
ANCA Proteinase 3: <3.5 U/mL (ref 0.0–3.5)
Myeloperoxidase Abs: <9 U/mL (ref 0.0–9.0)

## 2016-06-29 LAB — C3 COMPLEMENT: C3 Complement: 74 mg/dL — ABNORMAL LOW (ref 82–167)

## 2016-06-29 LAB — UREA NITROGEN, URINE: Urea Nitrogen, Ur: 173 mg/dL

## 2016-06-29 LAB — CALCIUM, IONIZED: CALCIUM, IONIZED, SERUM: 5 mg/dL (ref 4.5–5.6)

## 2016-06-29 LAB — C4 COMPLEMENT: Complement C4, Body Fluid: 8 mg/dL — ABNORMAL LOW (ref 14–44)

## 2016-06-29 MED ORDER — SODIUM BICARBONATE 650 MG PO TABS: 650.0000 mg | ORAL_TABLET | Freq: Every day | ORAL | 0 refills | Status: DC

## 2016-06-29 MED ORDER — PREDNISONE 20 MG PO TABS: 40.0000 mg | ORAL_TABLET | Freq: Every day | ORAL | 0 refills | Status: DC

## 2016-06-29 MED ORDER — MYCOPHENOLATE MOFETIL 500 MG PO TABS: 1000.0000 mg | ORAL_TABLET | Freq: Two times a day (BID) | ORAL | 0 refills | Status: DC

## 2016-06-29 MED ORDER — HYDROXYCHLOROQUINE SULFATE 200 MG PO TABS: 300.0000 mg | ORAL_TABLET | Freq: Every day | ORAL | 0 refills | Status: DC

## 2016-06-29 MED ORDER — FELODIPINE ER 5 MG PO TB24: 10.0000 mg | ORAL_TABLET | Freq: Every day | ORAL | 0 refills | Status: DC

## 2016-06-29 NOTE — Progress Notes (Signed)
Elinor Dodge to be D/C'd Home per MD order.  Discussed prescriptions and follow up appointments with the patient. Prescriptions given to patient, medication list explained in detail. Pt verbalized understanding.  Allergies as of 06/29/2016   No Known Allergies     Medication List    STOP taking these medications   lisinopril 20 MG tablet Commonly known as:  PRINIVIL,ZESTRIL   lisinopril 5 MG tablet Commonly known as:  PRINIVIL,ZESTRIL     TAKE these medications   cholecalciferol 1000 units tablet Commonly known as:  VITAMIN D Take 1,000 Units by mouth daily.   felodipine 5 MG 24 hr tablet Commonly known as:  PLENDIL Take 2 tablets (10 mg total) by mouth daily.   guaifenesin 400 MG Tabs tablet Commonly known as:  HUMIBID E Take 400 mg by mouth 2 (two) times daily.   hydroxychloroquine 200 MG tablet Commonly known as:  PLAQUENIL Take 1.5 tablets (300 mg total) by mouth daily.   mycophenolate 500 MG tablet Commonly known as:  CELLCEPT Take 2 tablets (1,000 mg total) by mouth 2 (two) times daily.   pantoprazole 40 MG tablet Commonly known as:  PROTONIX Take 40 mg by mouth 2 (two) times daily.   predniSONE 20 MG tablet Commonly known as:  DELTASONE Take 2 tablets (40 mg total) by mouth daily with breakfast. Take two tablets by mouth daily until rheumatology follow up. What changed:  how much to take  additional instructions   sodium bicarbonate 650 MG tablet Take 1 tablet (650 mg total) by mouth daily.   vitamin C 500 MG tablet Commonly known as:  ASCORBIC ACID Take 500 mg by mouth daily.       Vitals:   06/29/16 0442 06/29/16 1000  BP: (!) 163/98 (!) 165/95  Pulse: 92 69  Resp: 18 18  Temp: 98.4 F (36.9 C) 98.3 F (36.8 C)    Skin clean, dry and intact without evidence of skin break down, no evidence of skin tears noted. IV catheter discontinued intact. Site without signs and symptoms of complications. Dressing and pressure applied. Pt denies pain  at this time. No complaints noted.  An After Visit Summary was printed and given to the patient. Patient escorted via [place], and D/C home via private auto.  Haywood Lasso BSN, RN [facility] [address] Phone [phone]

## 2016-06-29 NOTE — Discharge Summary (Signed)
Physician Discharge Summary  Shakya Sebring MPN:[id number] DOB: Dec 03, 1960 DOA: 06/27/2016  PCP: Pcp Not In System  Admit date: 06/27/2016 Discharge date: 06/29/2016  Admitted From: Home Disposition: Home   Recommendations for Outpatient Follow-up:  1. Follow up with [facility] in 1-2 weeks 2. Please obtain BMP/CBC in one week to monitor CKD (unknown stage) and anemia.  3. Please follow up autoimmune serologies pending at discharge. 4. Consider grief counseling referral for processing grief surrounding her mother's recent passing.  Home Health: None Equipment/Devices: None new. Has rolling walker. Discharge Condition: Stable CODE STATUS: Full Diet recommendation: Heart healthy  Brief/Interim Summary: Kathleen [name] is a 56 y.o. female with PMH SLE presenting with weakness. The patient notes 2 - 3 weeks of generalized weakness and recently started with umbilical abdominal pain where she's had a hernia for years. Due to weakness with ambulation a close friend brought her to the ED. Her appetite has been poor and she's had a couple episodes of vomiting. She was hypertensive on evaluation with SCr 2.63 and no available baseline. Hgb 6.8, FOBT neg. CT of the abdomen and pelvis was obtained and negative for SBO, though showed fat in umbilical hernia. 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 She received 1u PRBCs with subsequent improvement in weakness and overall feeling of well-being. Hgb stable 8.3 > 8.7 following this. She also tearfully tells me of the recent passing of her mother, who was also her best friend, last month.   She last took medications for lupus about 8 months ago due to inability to afford medications, though she's unable to say what  changed that she was previously able to afford medications.   Discharge Diagnoses:  Principal Problem:   Symptomatic anemia Active Problems:   Lupus   Hypertension   Kidney disease   Abdominal pain   Current non-adherence to medical treatment   Hypertensive urgency   Renal insufficiency   SLE exacerbation (HCC)  Symptomatic anemia of chronic disease: Presumed subacute/chronic, though no priors for baseline she appears chronically ill with h/o transfusions related to nonhemorrhagic anemia and has CKD. Hgb 6.8 on admission with normal MCV and negative FOBT  - Recheck CBC is stable with no signs of bleeding and improvement in weakness. - No anemia panel was obtained prior to transfusion. Recommend further investigation at follow up.   Lupus: Followed at [facility], though can't recall name of provider. Has had this for 27 years.   - Prescribed Plaquenil, and CellCept but has not taken in months, will restart now.  - Serologies sent at admission and are pending.  - Continue prednisone daily until follow up with rheumatology. - CM consulted to assist with medications at discharge.   Abdominal pain: Exam and history indicate this is due to fat herniation into umbilical hernia which is reducible. No peritoneal signs or indications for surgical consultation on CT.   - Will continue to monitor. - Tolerating diet - Follow up as outpatient if willing to consider surgery.  Hypertensive urgency: Resolved, though BP still not controlled after restarting home felodipine (prescription provided).  - Continue to hold lisinopril due to renal impairment.  - Follow up as outpatient  Kidney disease of unknown chronicity or stage: SCr is 2.63 on admission with uncertain baseline; pt acknowledges  that she had a kidney problem about a year ago, but unable to elaborate. 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 Potassium jumped despite no intervention and is 4.9 at discharge.  - Resolved with repletion.   Discharge Instructions Discharge Instructions    Diet - low sodium heart healthy    Complete by:  As directed    Discharge instructions    Complete by:  As directed    You were admitted with weakness that is due to untreated lupus. You've improved with transfusion of blood and with restarting your home medications in addition to steroids. You are expected to continue improving slowly. You are clear for discharge with the following recommendations:  - Prescriptions have been printed and provided to you. Please take these medications as directed. - Call your rheumatologist TODAY to schedule a hospital follow up appointment as soon as possible.  - Continue walking with assistance until you get less weak.  - Because your hernia is causing you pain you should call the general surgery clinic to schedule follow up and consideration of surgery IF you feel like you would be willing to undergo surgery.  - Your symptoms will slowly improve, if they instead get acutely WORSE, please seek medical attention right away.   Increase activity slowly    Complete by:  As directed      Allergies as of 06/29/2016   No Known Allergies     Medication List    STOP taking these medications   lisinopril 20 MG tablet Commonly known as:  PRINIVIL,ZESTRIL   lisinopril 5 MG tablet Commonly known as:  PRINIVIL,ZESTRIL     TAKE these medications   cholecalciferol 1000 units tablet Commonly known as:  VITAMIN D Take 1,000 Units by mouth daily.   felodipine 5 MG 24 hr tablet Commonly known as:  PLENDIL Take 2 tablets (10 mg total) by mouth daily.   guaifenesin 400 MG Tabs  tablet Commonly known as:  HUMIBID E Take 400 mg by mouth 2 (two) times daily.   hydroxychloroquine 200 MG tablet Commonly known as:  PLAQUENIL Take 1.5 tablets (300 mg total) by mouth daily.   mycophenolate 500 MG tablet Commonly known as:  CELLCEPT Take 2 tablets (1,000 mg total) by mouth 2 (two) times daily.   pantoprazole 40 MG tablet Commonly known as:  PROTONIX Take 40 mg by mouth 2 (two) times daily.   predniSONE 20 MG tablet Commonly known as:  DELTASONE Take 2 tablets (40 mg total) by mouth daily with breakfast. Take two tablets by mouth daily until rheumatology follow up. What changed:  how much to take  additional instructions   sodium bicarbonate 650 MG tablet Take 1 tablet (650 mg total) by mouth daily.   vitamin C 500 MG tablet Commonly known as:  ASCORBIC ACID Take 500 mg by mouth daily.      Follow-up [facility]. Schedule an appointment as soon as possible for a visit.          No Known Allergies  Consultations:  None  Procedures/Studies: Ct Abdomen Pelvis Wo Contrast  Result Date: 06/27/2016 CLINICAL DATA:  Diffuse abdominal pain with persistent vomiting EXAM: CT ABDOMEN AND  PELVIS WITHOUT CONTRAST TECHNIQUE: Multidetector CT imaging of the abdomen and pelvis was performed following the standard protocol without IV contrast. COMPARISON:  None. FINDINGS: Lower chest: Bleb in the right lower lobe. No acute consolidation or effusion. Heart size appears slightly enlarged. Hepatobiliary: No focal hepatic abnormality. Calcified stones in the gallbladder. No wall thickening. No biliary dilatation. Pancreas: Unremarkable. No pancreatic ductal dilatation or surrounding inflammatory changes. Spleen: Normal in size without focal abnormality. Adrenals/Urinary Tract: Adrenal glands are within normal limits. No hydronephrosis. Probable areas of cortical scarring in the right kidney. Hyperdensities in the left kidney, some of which  appear to be within the medullary portion, suggestive of nephrocalcinosis. Others may represent parenchymal calcifications within the left kidney. No ureteral stones. Bladder normal Stomach/Bowel: Stomach is nonenlarged. No dilated small bowel. No colon wall thickening. Increased density within the lumen of the appendix but no inflammatory changes. Vascular/Lymphatic: Calcified aorta. No grossly enlarged lymph nodes Reproductive: Uterus grossly unremarkable. 2.6 cm rim calcified cyst in the right ovary. Other: No free air or free fluid.  Fat containing umbilical hernia. Musculoskeletal: No acute or suspicious bone lesions. IMPRESSION: 1. No CT evidence for a bowel obstruction. Appendicolith but no evidence for appendicitis. 2. 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 Others may represent parenchymal calcifications. Suspect cortical scarring in the lower pole of the right kidney. 3. 2.6 cm rim calcified cyst in the right ovary ; ultrasound may be obtained for further evaluation. Electronically Signed   By: Donavan Foil M.D.   On: 06/27/2016 23:45   Subjective: Patient reports improvement in weakness, has ambulated the hallways and ate full breakfast and lunch. No chest pain or dyspnea. Umbilical abdominal pain is stable. No nausea or vomiting.   Discharge Exam: Vitals:   06/29/16 0442 06/29/16 1000  BP: (!) 163/98 (!) 165/95  Pulse: 92 69  Resp: 18 18  Temp: 98.4 F (36.9 C) 98.3 F (36.8 C)   General: Chronically ill-appearing 56yo female appearing more alert today, in no distress Cardiovascular: RRR, S1/S2 +, no rubs, no gallops Respiratory: Nonlabored while walking without supplemental oxygen. CTAB, no wheezing, no rhonchi Abdominal: Soft, ND, bowel sounds +. Umbilical tenderness and very small reducible hernia. No rebound.  Extremities: No edema, no cyanosis  The results of significant diagnostics from this hospitalization  (including imaging, microbiology, ancillary and laboratory) are listed below for reference.    Labs: Basic Metabolic Panel:  Recent Labs Lab 06/27/16 1945 06/28/16 0336 06/28/16 0813 06/28/16 1531 06/29/16 0830  NA 134* 140  --  136 139  K 4.5 3.5  --  5.2* 4.9  CL 107 119*  --  109 108  CO2 18* 15*  --  17* 21*  GLUCOSE 89 73  --  157* 105*  BUN 18 15  --  20 26*  CREATININE 2.63* 1.98*  --  2.88* 2.81*  CALCIUM 7.7* 6.1*  --  8.9 9.4  MG  --   --  1.5* 2.6*  --    Liver Function Tests:  Recent Labs Lab 06/27/16 1945 06/28/16 0336  AST 17 14*  ALT 5* <5*  ALKPHOS 49 36*  BILITOT 0.5 0.4  PROT 5.8* 4.4*  ALBUMIN 2.3* 1.8*    Recent Labs Lab 06/27/16 1945  LIPASE 35   CBC:  Recent Labs Lab 06/27/16 1945 06/27/16 2111 06/28/16 1531 06/29/16 0830  WBC 7.2 8.0 5.9 13.8*  NEUTROABS 5.0  --   --   --  HGB 6.8* 6.8* 8.3* 8.7*  HCT 21.2* 21.8* 25.8* 27.0*  MCV 85.1 84.8 84.3 84.6  PLT 378 419* 374 466*   CBG:  Recent Labs Lab 06/28/16 0143 06/28/16 0811 06/29/16 0744  GLUCAP 78 121* 114*   Urinalysis    Component Value Date/Time   COLORURINE STRAW (A) 06/27/2016 2111   APPEARANCEUR CLEAR 06/27/2016 2111   LABSPEC 1.006 06/27/2016 2111   PHURINE 6.0 06/27/2016 2111   [place] 06/27/2016 2111   HGBUR NEGATIVE 06/27/2016 2111   [place] NEGATIVE 06/27/2016 2111   KETONESUR NEGATIVE 06/27/2016 2111   PROTEINUR 30 (A) 06/27/2016 2111   NITRITE NEGATIVE 06/27/2016 2111   LEUKOCYTESUR NEGATIVE 06/27/2016 2111   Time coordinating discharge: Approximately 40 minutes  Vance Gather, MD  [facility] 06/29/2016, 1:52 PM Pager [phone]  If 7PM-7AM, please contact night-coverage [email or web address] Password [email or web address]

## 2016-06-29 NOTE — Care Management Note (Signed)
Case Management Note  Patient Details  Name: Valissa Lyvers MRN: [id number] Date of Birth: January 25, 1961  Subjective/Objective:     CM following for progression and d/c planning.                Action/Plan: 06/29/2016 Referral to CM for medication assistance. This pt has [place] Medicaid therefore is not eligible for assistance as she has medication benefits with minimal copays. This was explained to the pt by the pt RN per CM request.   Expected Discharge Date:  06/29/16               Expected Discharge Plan:  Home/Self Care  In-House Referral:  NA  Discharge planning Services  CM Consult, Medication Assistance  Post Acute Care Choice:  NA Choice offered to:  NA  DME Arranged:   NA DME Agency:   NA  HH Arranged:   NA HH Agency:   NA  Status of Service:  Completed, signed off  If discussed at [place] of Stay Meetings, dates discussed:    Additional Comments:  Adron Bene, RN 06/29/2016, 4:52 PM

## 2016-06-30 LAB — ANTIPHOSPHOLIPID SYNDROME EVAL, BLD
ANTICARDIOLIPIN IGA: 10 U/mL (ref 0–11)
Anticardiolipin IgG: <9 GPL U/mL (ref 0–14)
Anticardiolipin IgM: <9 MPL U/mL (ref 0–12)
DRVVT: 50.7 s — ABNORMAL HIGH (ref 0.0–47.0)
PHOSPHATYDALSERINE, IGA: 7 {APS'U} (ref 0–20)
PTT Lupus Anticoagulant: 39.5 s (ref 0.0–51.9)
Phosphatydalserine, IgG: 2 GPS IgG (ref 0–11)
Phosphatydalserine, IgM: 2 MPS IgM (ref 0–25)

## 2016-06-30 LAB — ANCA TITERS

## 2016-06-30 LAB — DRVVT MIX: DRVVT MIX: 40.8 s (ref 0.0–47.0)

## 2016-07-08 ENCOUNTER — Emergency Department (HOSPITAL_COMMUNITY): Payer: Medicare Other

## 2016-07-08 ENCOUNTER — Encounter (HOSPITAL_COMMUNITY): Payer: Self-pay

## 2016-07-08 ENCOUNTER — Inpatient Hospital Stay (HOSPITAL_COMMUNITY)
Admission: EM | Admit: 2016-07-08 | Discharge: 2016-07-16 | DRG: 305 | Disposition: A | Discharge status: Home / Self Care | Payer: Medicare Other | Attending: Family Medicine | Admitting: Family Medicine

## 2016-07-08 DIAGNOSIS — F17210 Nicotine dependence, cigarettes, uncomplicated: Secondary | ICD-10-CM | POA: Diagnosis present

## 2016-07-08 DIAGNOSIS — M3214 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: Secondary | ICD-10-CM | POA: Diagnosis present

## 2016-07-08 DIAGNOSIS — K8020 Calculus of gallbladder without cholecystitis without obstruction: Secondary | ICD-10-CM | POA: Diagnosis present

## 2016-07-08 DIAGNOSIS — R4702 Dysphasia: Secondary | ICD-10-CM | POA: Diagnosis present

## 2016-07-08 DIAGNOSIS — K2970 Gastritis, unspecified, without bleeding: Secondary | ICD-10-CM | POA: Diagnosis present

## 2016-07-08 DIAGNOSIS — K5900 Constipation, unspecified: Secondary | ICD-10-CM | POA: Diagnosis present

## 2016-07-08 DIAGNOSIS — K429 Umbilical hernia without obstruction or gangrene: Secondary | ICD-10-CM | POA: Diagnosis present

## 2016-07-08 DIAGNOSIS — M329 Systemic lupus erythematosus, unspecified: Secondary | ICD-10-CM | POA: Diagnosis not present

## 2016-07-08 DIAGNOSIS — I517 Cardiomegaly: Secondary | ICD-10-CM | POA: Diagnosis present

## 2016-07-08 DIAGNOSIS — Z8673 Personal history of transient ischemic attack (TIA), and cerebral infarction without residual deficits: Secondary | ICD-10-CM | POA: Diagnosis not present

## 2016-07-08 DIAGNOSIS — R0902 Hypoxemia: Secondary | ICD-10-CM | POA: Diagnosis not present

## 2016-07-08 DIAGNOSIS — Z681 Body mass index (BMI) 19 or less, adult: Secondary | ICD-10-CM

## 2016-07-08 DIAGNOSIS — G43909 Migraine, unspecified, not intractable, without status migrainosus: Secondary | ICD-10-CM | POA: Diagnosis present

## 2016-07-08 DIAGNOSIS — N19 Unspecified kidney failure: Secondary | ICD-10-CM

## 2016-07-08 DIAGNOSIS — R059 Cough, unspecified: Secondary | ICD-10-CM

## 2016-07-08 DIAGNOSIS — I776 Arteritis, unspecified: Secondary | ICD-10-CM | POA: Diagnosis present

## 2016-07-08 DIAGNOSIS — Z9114 Patient's other noncompliance with medication regimen: Secondary | ICD-10-CM | POA: Diagnosis not present

## 2016-07-08 DIAGNOSIS — R05 Cough: Secondary | ICD-10-CM | POA: Diagnosis present

## 2016-07-08 DIAGNOSIS — L930 Discoid lupus erythematosus: Secondary | ICD-10-CM | POA: Diagnosis not present

## 2016-07-08 DIAGNOSIS — N179 Acute kidney failure, unspecified: Secondary | ICD-10-CM | POA: Diagnosis not present

## 2016-07-08 DIAGNOSIS — J449 Chronic obstructive pulmonary disease, unspecified: Secondary | ICD-10-CM | POA: Diagnosis present

## 2016-07-08 DIAGNOSIS — R569 Unspecified convulsions: Secondary | ICD-10-CM | POA: Diagnosis present

## 2016-07-08 DIAGNOSIS — N183 Chronic kidney disease, stage 3 (moderate): Secondary | ICD-10-CM | POA: Diagnosis present

## 2016-07-08 DIAGNOSIS — D638 Anemia in other chronic diseases classified elsewhere: Secondary | ICD-10-CM | POA: Diagnosis present

## 2016-07-08 DIAGNOSIS — I160 Hypertensive urgency: Secondary | ICD-10-CM | POA: Diagnosis not present

## 2016-07-08 DIAGNOSIS — E440 Moderate protein-calorie malnutrition: Secondary | ICD-10-CM | POA: Diagnosis present

## 2016-07-08 DIAGNOSIS — Z8611 Personal history of tuberculosis: Secondary | ICD-10-CM

## 2016-07-08 DIAGNOSIS — N189 Chronic kidney disease, unspecified: Secondary | ICD-10-CM | POA: Diagnosis not present

## 2016-07-08 DIAGNOSIS — I129 Hypertensive chronic kidney disease with stage 1 through stage 4 chronic kidney disease, or unspecified chronic kidney disease: Secondary | ICD-10-CM | POA: Diagnosis present

## 2016-07-08 DIAGNOSIS — I161 Hypertensive emergency: Secondary | ICD-10-CM

## 2016-07-08 DIAGNOSIS — K219 Gastro-esophageal reflux disease without esophagitis: Secondary | ICD-10-CM | POA: Diagnosis present

## 2016-07-08 DIAGNOSIS — Z915 Personal history of self-harm: Secondary | ICD-10-CM

## 2016-07-08 DIAGNOSIS — N289 Disorder of kidney and ureter, unspecified: Secondary | ICD-10-CM | POA: Diagnosis not present

## 2016-07-08 DIAGNOSIS — Z8674 Personal history of sudden cardiac arrest: Secondary | ICD-10-CM

## 2016-07-08 DIAGNOSIS — I10 Essential (primary) hypertension: Secondary | ICD-10-CM | POA: Diagnosis present

## 2016-07-08 DIAGNOSIS — Z9119 Patient's noncompliance with other medical treatment and regimen: Secondary | ICD-10-CM | POA: Diagnosis not present

## 2016-07-08 DIAGNOSIS — R509 Fever, unspecified: Secondary | ICD-10-CM

## 2016-07-08 DIAGNOSIS — E860 Dehydration: Secondary | ICD-10-CM | POA: Diagnosis present

## 2016-07-08 DIAGNOSIS — R1310 Dysphagia, unspecified: Secondary | ICD-10-CM | POA: Diagnosis present

## 2016-07-08 HISTORY — DX: Anemia, unspecified: D64.9

## 2016-07-08 HISTORY — DX: Nausea with vomiting, unspecified: Z98.890

## 2016-07-08 HISTORY — DX: Cerebral arteritis, not elsewhere classified: I67.7

## 2016-07-08 HISTORY — DX: Cellulitis, unspecified: L03.90

## 2016-07-08 HISTORY — DX: Calculus of gallbladder without cholecystitis without obstruction: K80.20

## 2016-07-08 HISTORY — DX: Vasculitis limited to the skin, unspecified: L95.9

## 2016-07-08 HISTORY — DX: Unspecified osteoarthritis, unspecified site: M19.90

## 2016-07-08 HISTORY — DX: Sepsis, unspecified organism: A41.9

## 2016-07-08 HISTORY — DX: Suicide attempt, initial encounter: T14.91XA

## 2016-07-08 HISTORY — DX: Other specified abnormal immunological findings in serum: R76.8

## 2016-07-08 HISTORY — DX: Other specified postprocedural states: R11.2

## 2016-07-08 HISTORY — DX: Respiratory tuberculosis unspecified: A15.9

## 2016-07-08 HISTORY — DX: Migraine, unspecified, not intractable, without status migrainosus: G43.909

## 2016-07-08 HISTORY — DX: Cardiac arrest, cause unspecified: I46.9

## 2016-07-08 HISTORY — DX: Unspecified convulsions: R56.9

## 2016-07-08 HISTORY — DX: Chronic obstructive pulmonary disease, unspecified: J44.9

## 2016-07-08 HISTORY — DX: Endometriosis, unspecified: N80.9

## 2016-07-08 LAB — CBC WITH DIFFERENTIAL/PLATELET
BASOS PCT: 0 %
Basophils Absolute: 0 10*3/uL (ref 0.0–0.1)
EOS ABS: 0.1 10*3/uL (ref 0.0–0.7)
EOS PCT: 1 %
HCT: 31.9 % — ABNORMAL LOW (ref 36.0–46.0)
Hemoglobin: 10 g/dL — ABNORMAL LOW (ref 12.0–15.0)
Lymphocytes Relative: 7 %
Lymphs Abs: 1.1 10*3/uL (ref 0.7–4.0)
MCH: 26.7 pg (ref 26.0–34.0)
MCHC: 31.3 g/dL (ref 30.0–36.0)
MCV: 85.1 fL (ref 78.0–100.0)
MONO ABS: 1.2 10*3/uL — AB (ref 0.1–1.0)
MONOS PCT: 8 %
Neutro Abs: 12.3 10*3/uL — ABNORMAL HIGH (ref 1.7–7.7)
Neutrophils Relative %: 84 %
Platelets: 426 10*3/uL — ABNORMAL HIGH (ref 150–400)
RBC: 3.75 MIL/uL — ABNORMAL LOW (ref 3.87–5.11)
RDW: 15.4 % (ref 11.5–15.5)
WBC: 14.6 10*3/uL — ABNORMAL HIGH (ref 4.0–10.5)

## 2016-07-08 LAB — URINALYSIS, ROUTINE W REFLEX MICROSCOPIC
BACTERIA UA: NONE SEEN
BILIRUBIN URINE: NEGATIVE
Glucose, UA: NEGATIVE mg/dL
Ketones, ur: NEGATIVE mg/dL
Leukocytes, UA: NEGATIVE
NITRITE: NEGATIVE
PH: 7 (ref 5.0–8.0)
Protein, ur: 100 mg/dL — AB
SPECIFIC GRAVITY, URINE: 1.005 (ref 1.005–1.030)

## 2016-07-08 LAB — COMPREHENSIVE METABOLIC PANEL
ALBUMIN: 3.2 g/dL — AB (ref 3.5–5.0)
ALK PHOS: 62 U/L (ref 38–126)
ALT: 11 U/L — AB (ref 14–54)
AST: 17 U/L (ref 15–41)
Anion gap: 8 (ref 5–15)
BUN: 38 mg/dL — AB (ref 6–20)
CALCIUM: 8.9 mg/dL (ref 8.9–10.3)
CO2: 25 mmol/L (ref 22–32)
CREATININE: 3.12 mg/dL — AB (ref 0.44–1.00)
Chloride: 102 mmol/L (ref 101–111)
GFR calc non Af Amer: 16 mL/min — ABNORMAL LOW (ref >60)
GFR, EST AFRICAN AMERICAN: 18 mL/min — AB (ref >60)
GLUCOSE: 120 mg/dL — AB (ref 65–99)
Potassium: 3.8 mmol/L (ref 3.5–5.1)
SODIUM: 135 mmol/L (ref 135–145)
Total Bilirubin: 0.7 mg/dL (ref 0.3–1.2)
Total Protein: 6.8 g/dL (ref 6.5–8.1)

## 2016-07-08 LAB — I-STAT CG4 LACTIC ACID, ED
LACTIC ACID, VENOUS: 0.96 mmol/L (ref 0.5–1.9)
Lactic Acid, Venous: 1.46 mmol/L (ref 0.5–1.9)

## 2016-07-08 LAB — I-STAT TROPONIN, ED: TROPONIN I, POC: 0.13 ng/mL — AB (ref 0.00–0.08)

## 2016-07-08 LAB — CBG MONITORING, ED: Glucose-Capillary: 101 mg/dL — ABNORMAL HIGH (ref 65–99)

## 2016-07-08 LAB — TROPONIN I: Troponin I: 0.21 ng/mL (ref <0.03)

## 2016-07-08 MED ORDER — SODIUM CHLORIDE 0.9 % IV SOLN
INTRAVENOUS | Status: DC
  Administered 2016-07-08 – 2016-07-16 (×9): via INTRAVENOUS

## 2016-07-08 MED ORDER — ONDANSETRON HCL 4 MG/2ML IJ SOLN
4.0000 mg | Freq: Once | INTRAMUSCULAR | Status: AC
  Administered 2016-07-08: 4 mg via INTRAVENOUS
  Filled 2016-07-08: qty 2

## 2016-07-08 MED ORDER — SODIUM CHLORIDE 0.9 % IV BOLUS (SEPSIS)
1000.0000 mL | Freq: Once | INTRAVENOUS | Status: AC
  Administered 2016-07-08: 1000 mL via INTRAVENOUS

## 2016-07-08 MED ORDER — NICARDIPINE HCL IN NACL 20-0.86 MG/200ML-% IV SOLN
3.0000 mg/h | Freq: Once | INTRAVENOUS | Status: AC
Start: 2016-07-08 — End: 2016-07-08
  Administered 2016-07-08: 5 mg/h via INTRAVENOUS
  Filled 2016-07-08: qty 200

## 2016-07-08 MED ORDER — NICARDIPINE HCL IN NACL 20-0.86 MG/200ML-% IV SOLN
3.0000 mg/h | Freq: Once | INTRAVENOUS | Status: AC
Start: 2016-07-08 — End: 2016-07-08
  Administered 2016-07-08: 5 mg/h via INTRAVENOUS

## 2016-07-08 MED ORDER — ACETAMINOPHEN 325 MG PO TABS
650.0000 mg | ORAL_TABLET | Freq: Once | ORAL | Status: AC
  Administered 2016-07-08: 650 mg via ORAL
  Filled 2016-07-08: qty 2

## 2016-07-08 MED ORDER — PANTOPRAZOLE SODIUM 40 MG PO TBEC
40.0000 mg | DELAYED_RELEASE_TABLET | Freq: Every day | ORAL | Status: DC
  Administered 2016-07-08 – 2016-07-16 (×9): 40 mg via ORAL
  Filled 2016-07-08 (×9): qty 1

## 2016-07-08 MED ORDER — PREDNISONE 50 MG PO TABS
50.0000 mg | ORAL_TABLET | Freq: Every day | ORAL | Status: DC
  Administered 2016-07-09 – 2016-07-16 (×8): 50 mg via ORAL
  Filled 2016-07-08 (×8): qty 1

## 2016-07-08 MED ORDER — CARVEDILOL 3.125 MG PO TABS
3.1250 mg | ORAL_TABLET | Freq: Two times a day (BID) | ORAL | Status: DC
  Administered 2016-07-08 – 2016-07-09 (×2): 3.125 mg via ORAL
  Filled 2016-07-08 (×4): qty 1

## 2016-07-08 MED ORDER — MYCOPHENOLATE MOFETIL 250 MG PO CAPS
1000.0000 mg | ORAL_CAPSULE | Freq: Two times a day (BID) | ORAL | Status: DC
  Administered 2016-07-08 – 2016-07-16 (×16): 1000 mg via ORAL
  Filled 2016-07-08 (×16): qty 4

## 2016-07-08 MED ORDER — IOPAMIDOL (ISOVUE-300) INJECTION 61%
INTRAVENOUS | Status: AC
  Filled 2016-07-08: qty 30

## 2016-07-08 MED ORDER — NICARDIPINE HCL IN NACL 20-0.86 MG/200ML-% IV SOLN
3.0000 mg/h | INTRAVENOUS | Status: DC
  Administered 2016-07-08: 3 mg/h via INTRAVENOUS
  Filled 2016-07-08: qty 200

## 2016-07-08 MED ORDER — HYDROXYCHLOROQUINE SULFATE 200 MG PO TABS
300.0000 mg | ORAL_TABLET | Freq: Every day | ORAL | Status: DC
  Administered 2016-07-08 – 2016-07-16 (×9): 300 mg via ORAL
  Filled 2016-07-08 (×4): qty 2
  Filled 2016-07-08: qty 1.5
  Filled 2016-07-08 (×3): qty 2
  Filled 2016-07-08: qty 1.5
  Filled 2016-07-08 (×2): qty 2

## 2016-07-08 MED ORDER — AMLODIPINE BESYLATE 5 MG PO TABS
5.0000 mg | ORAL_TABLET | Freq: Once | ORAL | Status: AC
  Administered 2016-07-08: 5 mg via ORAL
  Filled 2016-07-08: qty 1

## 2016-07-08 MED ORDER — FENTANYL CITRATE (PF) 100 MCG/2ML IJ SOLN
50.0000 ug | INTRAMUSCULAR | Status: DC | PRN
  Administered 2016-07-08 – 2016-07-09 (×2): 50 ug via INTRAVENOUS
  Filled 2016-07-08 (×2): qty 2

## 2016-07-08 MED ORDER — AMLODIPINE BESYLATE 5 MG PO TABS
5.0000 mg | ORAL_TABLET | Freq: Every day | ORAL | Status: DC
  Administered 2016-07-09: 5 mg via ORAL
  Filled 2016-07-08: qty 1

## 2016-07-08 MED ORDER — HEPARIN SODIUM (PORCINE) 5000 UNIT/ML IJ SOLN
5000.0000 [IU] | Freq: Three times a day (TID) | INTRAMUSCULAR | Status: AC
  Administered 2016-07-08 – 2016-07-11 (×9): 5000 [IU] via SUBCUTANEOUS
  Filled 2016-07-08 (×8): qty 1

## 2016-07-08 NOTE — ED Notes (Signed)
Patient transported to CT 

## 2016-07-08 NOTE — ED Notes (Signed)
ED MD made aware of BP, reports to stop cardene drip.

## 2016-07-08 NOTE — ED Notes (Signed)
RN-Sarah informed of I-stat troponin

## 2016-07-08 NOTE — ED Notes (Signed)
Dr. Alvino Chapel made aware of troponin.

## 2016-07-08 NOTE — ED Triage Notes (Signed)
Pt arrives with family c/o abdominal pain from a hernia that has been ongoing

## 2016-07-08 NOTE — ED Notes (Signed)
Per admitting provider, pt will remain on cardene drip as necessary and is appropriate for an ICU bed.

## 2016-07-08 NOTE — ED Notes (Signed)
Pt drinking oral contrast starting 2nd cup, but is making her nauseated. zofran given. Will do scan at 1830.

## 2016-07-08 NOTE — ED Notes (Signed)
Systolic BPs consistently below 150-170 goal, cardene drip stopped, admitting provider aware.

## 2016-07-08 NOTE — ED Notes (Signed)
Patient transported to X-ray 

## 2016-07-08 NOTE — ED Provider Notes (Signed)
[facility] DEPT Provider Note   CSN: [id number] Arrival date & time: 07/08/16  1354     History   Chief Complaint Chief Complaint  Patient presents with  . Abnormal Lab    Pt was called by PMD for abnormal labs and advised to come in due to worsening kidney function     HPI Kathleen [name] is a 56 y.o. female.  HPI  CC: fatigue  Onset/Duration: 7-10 days Timing: constant, worsening Location: generalized Severity: moderate Modifying Factors:  Improved by: nothing  Worsened by: nothing Associated Signs/Symptoms:  Pertinent (+): fever, cough, abd pain, diarrhea, slight dysuria  Pertinent (-): emesis, chest pain, sob. Context: pt was sent by the Rheumatologist for AKI likely due to SLE flare. Patient was recently admitted in mid February for hypertensive urgency, anemia requiring blood transfusions, and lupus flare.  No recent Abx.    Past Medical History:  Diagnosis Date  . Acute renal failure (ARF) ([place])   . Anemia   . Arthritis   . Cardiac arrest ([place])   . Cellulitis   . Cerebral vasculitis   . COPD (chronic obstructive pulmonary disease) ([place])   . Elevated antinuclear antibody (ANA) level   . Endometriosis   . Gallstones   . Hypertension   . Lupus   . Migraine   . Seizures ([place])   . Sepsis ([place])   . Suicide attempt   . TB (tuberculosis)   . Transient ischemic attack (TIA)   . Vasculitis of skin     Patient Active Problem List   Diagnosis Date Noted  . Lupus 06/28/2016  . Hypertension 06/28/2016  . Kidney disease 06/28/2016  . Symptomatic anemia 06/28/2016  . Abdominal pain 06/28/2016  . Current non-adherence to medical treatment 06/28/2016  . Hypertensive urgency 06/28/2016  . SLE exacerbation ([place]) 06/28/2016  . Renal insufficiency     Past Surgical History:  Procedure Laterality Date  . CESAREAN SECTION    . TUBAL LIGATION      OB History    No data available       Home Medications    Prior to Admission medications     Medication Sig Start Date End Date Taking? Authorizing Provider  cholecalciferol (VITAMIN D) 1000 units tablet Take 1,000 Units by mouth daily.   Yes Historical Provider, MD  felodipine (PLENDIL) 5 MG 24 hr tablet Take 2 tablets (10 mg total) by mouth daily. 06/29/16  Yes Patrecia Pour, MD  hydroxychloroquine (PLAQUENIL) 200 MG tablet Take 1.5 tablets (300 mg total) by mouth daily. 06/29/16  Yes Patrecia Pour, MD  mycophenolate (CELLCEPT) 500 MG tablet Take 2 tablets (1,000 mg total) by mouth 2 (two) times daily. 06/29/16  Yes Patrecia Pour, MD  predniSONE (DELTASONE) 20 MG tablet Take 2 tablets (40 mg total) by mouth daily with breakfast. Take two tablets by mouth daily until rheumatology follow up. 06/29/16  Yes Patrecia Pour, MD  sodium bicarbonate 650 MG tablet Take 1 tablet (650 mg total) by mouth daily. 06/29/16  Yes Patrecia Pour, MD  vitamin C (ASCORBIC ACID) 500 MG tablet Take 500 mg by mouth daily.   Yes Historical Provider, MD  guaifenesin (HUMIBID E) 400 MG TABS tablet Take 400 mg by mouth 2 (two) times daily.    Historical Provider, MD  pantoprazole (PROTONIX) 40 MG tablet Take 40 mg by mouth 2 (two) times daily.    Historical Provider, MD    Family History No family history on file.  Social History  Social History  Substance Use Topics  . Smoking status: Current Some Day Smoker    Packs/day: 0.50    Types: Cigarettes  . Smokeless tobacco: Never Used  . Alcohol use No     Allergies   Patient has no known allergies.   Review of Systems Review of Systems  Ten systems are reviewed and are negative for acute change except as noted in the HPI  Physical Exam Updated Vital Signs BP (!) 168/106   Pulse 101   Temp 101.8 F (38.8 C) (Oral)   Resp 19   Ht 5\' 4"  (1.626 m)   Wt 120 lb (54.4 kg)   SpO2 100%   BMI 20.60 kg/m   Physical Exam  Constitutional: She is oriented to person, place, and time. She appears well-developed and well-nourished. She appears ill. No distress.   HENT:  Head: Normocephalic and atraumatic.  Nose: Nose normal.  Eyes: Conjunctivae and EOM are normal. Pupils are equal, round, and reactive to light. Right eye exhibits no discharge. Left eye exhibits no discharge. No scleral icterus.  Neck: Normal range of motion. Neck supple.  Cardiovascular: Normal rate and regular rhythm.  Exam reveals no gallop and no friction rub.   No murmur heard. Pulmonary/Chest: Effort normal and breath sounds normal. No stridor. No respiratory distress. She has no rales.  Abdominal: Soft. She exhibits no distension. There is generalized tenderness. There is no rigidity, no rebound and no guarding. A hernia is present. Hernia confirmed positive in the ventral area.  Musculoskeletal: She exhibits no edema or tenderness.  Neurological: She is alert and oriented to person, place, and time.  Skin: Skin is warm and dry. No rash noted. She is not diaphoretic. No erythema.  Psychiatric: She has a normal mood and affect.  Vitals reviewed.    ED Treatments / Results  Labs (all labs ordered are listed, but only abnormal results are displayed) Labs Reviewed  COMPREHENSIVE METABOLIC PANEL - Abnormal; Notable for the following:       Result Value   Glucose, Bld 120 (*)    BUN 38 (*)    Creatinine, Ser 3.12 (*)    Albumin 3.2 (*)    ALT 11 (*)    GFR calc non Af Amer 16 (*)    GFR calc Af Amer 18 (*)    All other components within normal limits  CBC WITH DIFFERENTIAL/PLATELET - Abnormal; Notable for the following:    WBC 14.6 (*)    RBC 3.75 (*)    Hemoglobin 10.0 (*)    HCT 31.9 (*)    Platelets 426 (*)    Neutro Abs 12.3 (*)    Monocytes Absolute 1.2 (*)    All other components within normal limits  CBG MONITORING, ED - Abnormal; Notable for the following:    Glucose-Capillary 101 (*)    All other components within normal limits  CULTURE, BLOOD (ROUTINE X 2)  CULTURE, BLOOD (ROUTINE X 2)  URINALYSIS, ROUTINE W REFLEX MICROSCOPIC  I-STAT CG4 LACTIC ACID,  ED    EKG  EKG Interpretation  Date/Time:  Thursday July 08 2016 14:21:50 EST Ventricular Rate:  97 PR Interval:    QRS Duration: 101 QT Interval:  364 QTC Calculation: 463 R Axis:   71 Text Interpretation:  Sinus rhythm Consider right atrial enlargement Artifact Baseline wander no stemi Confirmed by [facility] MD, Lyndsee Casa ([id number]) on 07/08/2016 3:10:52 PM       Radiology No results found.  Procedures Procedures (including critical care  time)  Medications Ordered in ED Medications  sodium chloride 0.9 % bolus 1,000 mL (0 mLs Intravenous Stopped 07/08/16 1521)  acetaminophen (TYLENOL) tablet 650 mg (650 mg Oral Given 07/08/16 1459)  nicardipine (CARDENE) 20mg  in 0.86% saline 249ml IV infusion (0.1 mg/ml) (2.5 mg/hr Intravenous Rate/Dose Change 07/08/16 1546)     Initial Impression / Assessment and Plan / ED Course  I have reviewed the triage vital signs and the nursing notes.  Pertinent labs & imaging results that were available during my care of the patient were reviewed by me and considered in my medical decision making (see chart for details).     On review the records noted that the patient had creatinine of 3.3. Found to be hypertensive at 220/130s and febrile. Infectious work up initiated. Treated with cardene for hypertensive urgency.   Patient care turned over to Dr Alvino Chapel at 1600. Patient case and results discussed in detail; please see their note for further ED managment.      Fatima Blank, MD 07/08/16 [phone]

## 2016-07-08 NOTE — H&P (Signed)
Name: Kathleen [name] MRN: [id number] DOB: 03/19/1961    ADMISSION DATE:  07/08/2016 CONSULTATION DATE:  07/08/2016  REFERRING MD :  Dr. Alvino Chapel EDP  CHIEF COMPLAINT:  Abdominal pain  HISTORY OF PRESENT ILLNESS:  56 year old female with past medical history as below, which is significant for systemic lupus erythematous (followed loosely by Dr. Annabelle Harman at [facility] rheumatology), vasculitis, COPD, hypertension, seizures, and suicide attempt. She was recently admitted to [facility] with presenting diagnosis of weakness. She was found be symptomatically anemic requiring blood transfusion. It was also found that she had not been taking her Plaquenil, CellCept, or prednisone and these were restarted. Her serum creatinine was also elevated and this was thought to be secondary to lupus nephritis, although this had not been confirmed. Since discharge she had been doing okay until about 2/26 when she started having weakness again. 3/1 She called her nephrology office who reviewed the labs for her admission and is growing increasingly worried about lupus nephritis. He increased her dose of prednisone and asked that she present to the nearest emergency department.  3/1 she presented [facility] emergency department with chief complaint abdominal pain and weakness. Abdominal pain complaint is consistent with umbilical hernia that she has had for some time but has just recently become painful in recent months. She was also found to be profoundly hypertensive with systolic blood pressure 263. She was started on nicardipine infusion after not responding to by mouth amlodipine. She underwent CT of her abdomen and pelvis which again demonstrated umbilical hernia and no other cause for acute abdominal pain. She is unable to be liberated from the carpet infusion, therefore PCCM has been asked to admit to ICU.  SIGNIFICANT EVENTS  2/19 >2/20 admit for HTN and weakness 3/1 admit for HTN  urgency  STUDIES:  CT abd 3/1 > Gastric wall thickening about the cardia, nonspecific may be infectious or inflammatory. Otherwise no acute abnormality. Stable chronic findings of cholelithiasis, fat containing umbilical hernia, left renal nephrocalcinosis, aortic atherosclerosis and peripherally calcified right adnexal cyst.   PAST MEDICAL HISTORY :   has a past medical history of Acute renal failure (ARF) ([place]); Anemia; Arthritis; Cardiac arrest ([place]); Cellulitis; Cerebral vasculitis; COPD (chronic obstructive pulmonary disease) ([place]); Elevated antinuclear antibody (ANA) level; Endometriosis; Gallstones; Hypertension; Lupus; Migraine; Seizures ([place]); Sepsis ([place]); Suicide attempt; TB (tuberculosis); Transient ischemic attack (TIA); and Vasculitis of skin.  has a past surgical history that includes Cesarean section and Tubal ligation. Prior to Admission medications   Medication Sig Start Date End Date Taking? Authorizing Provider  cholecalciferol (VITAMIN D) 1000 units tablet Take 1,000 Units by mouth daily.   Yes Historical Provider, MD  felodipine (PLENDIL) 5 MG 24 hr tablet Take 2 tablets (10 mg total) by mouth daily. 06/29/16  Yes Patrecia Pour, MD  hydroxychloroquine (PLAQUENIL) 200 MG tablet Take 1.5 tablets (300 mg total) by mouth daily. 06/29/16  Yes Patrecia Pour, MD  mycophenolate (CELLCEPT) 500 MG tablet Take 2 tablets (1,000 mg total) by mouth 2 (two) times daily. 06/29/16  Yes Patrecia Pour, MD  predniSONE (DELTASONE) 20 MG tablet Take 2 tablets (40 mg total) by mouth daily with breakfast. Take two tablets by mouth daily until rheumatology follow up. 06/29/16  Yes Patrecia Pour, MD  sodium bicarbonate 650 MG tablet Take 1 tablet (650 mg total) by mouth daily. 06/29/16  Yes Patrecia Pour, MD  vitamin C (ASCORBIC ACID) 500 MG tablet Take 500 mg by mouth daily.  Yes Historical Provider, MD  guaifenesin (HUMIBID E) 400 MG TABS tablet Take 400 mg by mouth 2 (two) times daily.    Historical  Provider, MD  pantoprazole (PROTONIX) 40 MG tablet Take 40 mg by mouth 2 (two) times daily.    Historical Provider, MD   No Known Allergies  FAMILY HISTORY:  family history is not on file. SOCIAL HISTORY:  reports that she has been smoking Cigarettes.  She has been smoking about 0.50 packs per day. She has never used smokeless tobacco. She reports that she does not drink alcohol or use drugs.  REVIEW OF SYSTEMS:  Limited due to lethargy  Bolds are positive  Constitutional: weight loss, gain, night sweats, Fevers, chills, fatigue .  HEENT: headaches, Sore throat, sneezing, nasal congestion, post nasal drip, Difficulty swallowing, Tooth/dental problems, visual complaints visual changes, ear ache CV:  chest pain, radiates:,Orthopnea, PND, swelling in lower extremities**, dizziness, palpitations, syncope.  GI  heartburn, indigestion, abdominal pain: umbilical, nausea, vomiting, diarrhea, change in bowel habits, loss of appetite, bloody stools.  Resp: cough, productive: , hemoptysis, dyspnea, chest pain, pleuritic.  Skin: rash or itching or icterus GU: dysuria, change in color of urine, urgency or frequency. flank pain, hematuria  MS: joint pain or swelling. decreased range of motion  Psych: change in mood or affect. depression or anxiety.  Neuro: difficulty with speech, weakness, numbness, ataxia    SUBJECTIVE:   VITAL SIGNS: Temp:  [99.5 F (37.5 C)-101.8 F (38.8 C)] 99.5 F (37.5 C) (03/01 1711) Pulse Rate:  [88-107] 89 (03/01 1930) Resp:  [16-20] 17 (03/01 1930) BP: (148-211)/(101-137) 155/111 (03/01 1930) SpO2:  [99 %-100 %] 100 % (03/01 1930) Weight:  [54.4 kg (120 lb)] 54.4 kg (120 lb) (03/01 1405)  PHYSICAL EXAMINATION: General:  Thin female in no acute distress, resting in bed. Neuro:  Somnolent but arouses to verbal stimuli. Answers questions appropriate. HEENT:  Normocephalic atraumatic, PERRL, no appreciable JVD Cardiovascular:  RRR, no MRG Lungs:  Clear bilateral  breath sounds Abdomen:  Soft, generalized tenderness, nondistended. Umbilical hernia appears to have been reduced. Musculoskeletal:  No acute deformity Skin:  Grossly intact, mucous membranes dry   Recent Labs Lab 07/08/16 1437  NA 135  K 3.8  CL 102  CO2 25  BUN 38*  CREATININE 3.12*  GLUCOSE 120*    Recent Labs Lab 07/08/16 1437  HGB 10.0*  HCT 31.9*  WBC 14.6*  PLT 426*   Ct Abdomen Pelvis Wo Contrast  Result Date: 07/08/2016 CLINICAL DATA:  Abdominal pain.  Acute kidney injury. EXAM: CT ABDOMEN AND PELVIS WITHOUT CONTRAST TECHNIQUE: Multidetector CT imaging of the abdomen and pelvis was performed following the standard protocol without IV contrast. COMPARISON:  CT 2 weeks prior 06/27/2016 FINDINGS: Lower chest: Lung bases are clear. Heart is prominent size. No pleural fluid. Hepatobiliary: Calcified gallstone without abnormal gallbladder distention. No evidence of pericholecystic inflammation. No focal hepatic lesion allowing for noncontrast exam. Pancreas: Suboptimally assessed due to paucity of body fat and lack of intravenous contrast, no gross inflammation or ductal dilatation. Spleen:  Normal in size. Adrenals/Urinary Tract: No evidence of adrenal nodule. No hydronephrosis. Probable right lower pole renal scarring. Increased density of the renal medulla on the left suggesting nephrocalcinosis. No perinephric edema. Ureters are decompressed and not well evaluated. Urinary bladder is physiologically distended without stone. Stomach/Bowel: Gastric wall thickening about the cardia. More distal stomach is normal. Intraluminal low density in the second and third portion of the duodenum is likely secondary to ingested  material versus focal wall abnormality. No evidence of bowel obstruction, enteric contrast reaches the ileal bowel loops. Appendix contains intraluminal high-density, may be from prior enteric contrast administration. No evidence of colonic wall thickening or inflammation.  Vascular/Lymphatic: Aortic atherosclerosis without aneurysm. No bulky adenopathy within limitations of noncontrast exam and paucity of intra-abdominal fat. Reproductive: Right adnexal cyst measures 2.8 cm with peripheral calcification, unchanged from prior exam allowing for differences in caliper placement. Uterus is quiescent. Left ovary not confidently identified. Other: Small fat containing umbilical hernia without bowel involvement. No evidence of inflammation. Minimal mesenteric edema and trace fluid adjacent to the inferior liver tip and in the pelvis. No free air. Musculoskeletal: There are no acute or suspicious osseous abnormalities. The bones are under mineralized. IMPRESSION: 1. Gastric wall thickening about the cardia, nonspecific may be infectious or inflammatory. Otherwise no acute abnormality. 2. Stable chronic findings of cholelithiasis, fat containing umbilical hernia, left renal nephrocalcinosis, aortic atherosclerosis and peripherally calcified right adnexal cyst. Electronically Signed   By: Jeb Levering M.D.   On: 07/08/2016 18:59   Dg Chest 2 View  Result Date: 07/08/2016 CLINICAL DATA:  Cough EXAM: CHEST  2 VIEW COMPARISON:  None. FINDINGS: Heart is upper limits normal in size. Lungs are clear. No effusions. No acute bony abnormality. IMPRESSION: No active cardiopulmonary disease. Electronically Signed   By: Rolm Baptise M.D.   On: 07/08/2016 16:57    ASSESSMENT / PLAN:  Hypertensive urgency with history of HTN: Likely due to poor medical compliance with antihypertensive regimen and recent increase in prednisone dosing. - Telemetry monitoring - Nicardipine infusion to target systolic blood pressure goal 160-157mmHg. - Initiate scheduled amlodipine, Coreg - Her rheumatologist indicates that we should maintain current dosing of prednisone if at all able, so will leave this for now.  Acute on chronic kidney disease: Her outpatient rheumatologist fears that she is developing lupus  nephritis, however, there is some evidence of hemoconcentration on her BMP. Given her somnolent mental status and dry mucous membranes she may also be somewhat dehydrated. - Gentle IV fluids - Will continue prednisone at outpatient dosing - Continue to trend BMP and urine output - May require nephrology and/or rheumatology consultation if not improving.  Abdominal pain: Likely caused by a umbilical hernia, which is only fat-containing based on CT. It has been easily reducible by report. They are requesting surgical consultation - Unfortunately need to limit sedating pain medications given her current mental status  - CT reassuring as it does not describe any other source of acute pain  - Revisit surgical consultation in the morning   Systemic lupus erythematous  - Continue CellCept, Plaquenil, prednisone   Best Practice VTE ppx: heparin SQ SUP: PO protonix Dispo:  ICU  Georgann Housekeeper, AGACNP-BC [facility] Pulmonology/Critical Care Pager [phone] or [phone]  07/08/2016 8:00 PM

## 2016-07-08 NOTE — ED Provider Notes (Signed)
  Physical Exam  BP (!) 155/111   Pulse 89   Temp 99.5 F (37.5 C) (Oral)   Resp 17   Ht 5\' 4"  (1.626 m)   Wt 120 lb (54.4 kg)   SpO2 100%   BMI 20.60 kg/m   Physical Exam  ED Course  Procedures  MDM Received patient in signout. Has hypertension. Has been on Cardene drip. Attempted to come off of her blood pressure increased again. Had been given oral medicines. CT scan done due to fever and some abdominal pain. No clear cause. Urine reassuring. Will admit to ICU.  CRITICAL CARE Performed by: Mackie Pai Total critical care time:30 minutes Critical care time was exclusive of separately billable procedures and treating other patients. Critical care was necessary to treat or prevent imminent or life-threatening deterioration. Critical care was time spent personally by me on the following activities: development of treatment plan with patient and/or surrogate as well as nursing, discussions with consultants, evaluation of patient's response to treatment, examination of patient, obtaining history from patient or surrogate, ordering and performing treatments and interventions, ordering and review of laboratory studies, ordering and review of radiographic studies, pulse oximetry and re-evaluation of patient's condition.        Davonna Belling, MD 07/08/16 [phone]

## 2016-07-08 NOTE — ED Notes (Signed)
Critical care at bedside  

## 2016-07-09 DIAGNOSIS — N179 Acute kidney failure, unspecified: Secondary | ICD-10-CM

## 2016-07-09 DIAGNOSIS — N189 Chronic kidney disease, unspecified: Secondary | ICD-10-CM

## 2016-07-09 DIAGNOSIS — L930 Discoid lupus erythematosus: Secondary | ICD-10-CM

## 2016-07-09 DIAGNOSIS — R0902 Hypoxemia: Secondary | ICD-10-CM

## 2016-07-09 LAB — BASIC METABOLIC PANEL
ANION GAP: 11 (ref 5–15)
BUN: 32 mg/dL — ABNORMAL HIGH (ref 6–20)
CALCIUM: 8.6 mg/dL — AB (ref 8.9–10.3)
CO2: 22 mmol/L (ref 22–32)
CREATININE: 2.94 mg/dL — AB (ref 0.44–1.00)
Chloride: 101 mmol/L (ref 101–111)
GFR, EST AFRICAN AMERICAN: 20 mL/min — AB (ref >60)
GFR, EST NON AFRICAN AMERICAN: 17 mL/min — AB (ref >60)
Glucose, Bld: 69 mg/dL (ref 65–99)
Potassium: 4.1 mmol/L (ref 3.5–5.1)
SODIUM: 134 mmol/L — AB (ref 135–145)

## 2016-07-09 LAB — CBC
HEMATOCRIT: 27.6 % — AB (ref 36.0–46.0)
Hemoglobin: 8.9 g/dL — ABNORMAL LOW (ref 12.0–15.0)
MCH: 27.5 pg (ref 26.0–34.0)
MCHC: 32.2 g/dL (ref 30.0–36.0)
MCV: 85.2 fL (ref 78.0–100.0)
PLATELETS: 333 10*3/uL (ref 150–400)
RBC: 3.24 MIL/uL — ABNORMAL LOW (ref 3.87–5.11)
RDW: 16 % — AB (ref 11.5–15.5)
WBC: 11.9 10*3/uL — AB (ref 4.0–10.5)

## 2016-07-09 LAB — PROCALCITONIN
PROCALCITONIN: 0.23 ng/mL
Procalcitonin: 0.31 ng/mL

## 2016-07-09 LAB — PHOSPHORUS: PHOSPHORUS: 4.2 mg/dL (ref 2.5–4.6)

## 2016-07-09 LAB — GLUCOSE, CAPILLARY: Glucose-Capillary: 133 mg/dL — ABNORMAL HIGH (ref 65–99)

## 2016-07-09 LAB — TROPONIN I
TROPONIN I: 0.14 ng/mL — AB (ref <0.03)
Troponin I: 0.17 ng/mL (ref <0.03)

## 2016-07-09 LAB — MAGNESIUM: MAGNESIUM: 1.4 mg/dL — AB (ref 1.7–2.4)

## 2016-07-09 LAB — MRSA PCR SCREENING: MRSA by PCR: NEGATIVE

## 2016-07-09 MED ORDER — CARVEDILOL 6.25 MG PO TABS
6.2500 mg | ORAL_TABLET | Freq: Two times a day (BID) | ORAL | Status: DC
  Administered 2016-07-09 – 2016-07-11 (×4): 6.25 mg via ORAL
  Filled 2016-07-09 (×4): qty 1

## 2016-07-09 MED ORDER — AMLODIPINE BESYLATE 5 MG PO TABS
5.0000 mg | ORAL_TABLET | Freq: Once | ORAL | Status: AC
  Administered 2016-07-09: 5 mg via ORAL
  Filled 2016-07-09: qty 1

## 2016-07-09 MED ORDER — WHITE PETROLATUM GEL
Status: AC
Start: 2016-07-09 — End: 2016-07-09
  Administered 2016-07-09: 13:00:00
  Filled 2016-07-09: qty 1

## 2016-07-09 MED ORDER — HEPARIN SODIUM (PORCINE) 5000 UNIT/ML IJ SOLN
5000.0000 [IU] | Freq: Three times a day (TID) | INTRAMUSCULAR | Status: DC
Start: 2016-07-13 — End: 2016-07-16
  Administered 2016-07-13 – 2016-07-16 (×10): 5000 [IU] via SUBCUTANEOUS
  Filled 2016-07-09 (×11): qty 1

## 2016-07-09 MED ORDER — AMLODIPINE BESYLATE 10 MG PO TABS
10.0000 mg | ORAL_TABLET | Freq: Every day | ORAL | Status: DC
  Administered 2016-07-10 – 2016-07-16 (×7): 10 mg via ORAL
  Filled 2016-07-09 (×8): qty 1

## 2016-07-09 MED ORDER — AMLODIPINE BESYLATE 10 MG PO TABS: 10.0000 mg | ORAL_TABLET | Freq: Every day | ORAL | Status: DC

## 2016-07-09 NOTE — Progress Notes (Signed)
Name: Kathleen [name] MRN: [id number] DOB: [birth year]    ADMISSION DATE:  07/08/2016 CONSULTATION DATE:  07/08/2016  REFERRING MD :  Dr. Alvino Chapel EDP  CHIEF COMPLAINT:  Abdominal pain  HISTORY OF PRESENT ILLNESS:  56 year old female with past medical history as below, which is significant for systemic lupus erythematous (followed loosely by Dr. Annabelle Harman at [facility] rheumatology), vasculitis, COPD, hypertension, seizures, and suicide attempt. She was recently admitted to [facility] with presenting diagnosis of weakness. She was found be symptomatically anemic requiring blood transfusion. It was also found that she had not been taking her Plaquenil, CellCept, or prednisone and these were restarted. Her serum creatinine was also elevated and this was thought to be secondary to lupus nephritis, although this had not been confirmed. Since discharge she had been doing okay until about 2/26 when she started having weakness again. 3/1 She called her nephrology office who reviewed the labs for her admission and is growing increasingly worried about lupus nephritis. He increased her dose of prednisone and asked that she present to the nearest emergency department.  3/1 she presented [facility] emergency department with chief complaint abdominal pain and weakness. Abdominal pain complaint is consistent with umbilical hernia that she has had for some time but has just recently become painful in recent months. She was also found to be profoundly hypertensive with systolic blood pressure 841. She was started on nicardipine infusion after not responding to by mouth amlodipine. She underwent CT of her abdomen and pelvis which again demonstrated umbilical hernia and no other cause for acute abdominal pain. She is unable to be liberated from the carpet infusion, therefore PCCM has been asked to admit to ICU.  SIGNIFICANT EVENTS  2/19 >2/20 admit for HTN and weakness 3/1 admit for HTN  urgency  STUDIES:  CT abd 3/1 > Gastric wall thickening about the cardia, nonspecific may be infectious or inflammatory. Otherwise no acute abnormality. Stable chronic findings of cholelithiasis, fat containing umbilical hernia, left renal nephrocalcinosis, aortic atherosclerosis and peripherally calcified right adnexal cyst.    SUBJECTIVE:  Feels better & want to eat  VITAL SIGNS: Temp:  [98 F (36.7 C)-101.8 F (38.8 C)] 98.8 F (37.1 C) (03/02 0830) Pulse Rate:  [71-107] 79 (03/02 1130) Resp:  [12-28] 13 (03/02 1130) BP: (127-211)/(92-137) 133/100 (03/02 1130) SpO2:  [98 %-100 %] 100 % (03/02 1130) FiO2 (%):  [0 %] 0 % (03/01 2003) Weight:  [111 lb 5.3 oz (50.5 kg)-120 lb (54.4 kg)] 111 lb 5.3 oz (50.5 kg) (03/01 2248)  PHYSICAL EXAMINATION: General appearance:  56 Year old  female, well nourished NAD,conversant  Eyes: anicteric sclerae, moist conjunctivae; PERRL, EOMI bilaterally. Mouth:  membranes and no mucosal ulcerations; normal hard and soft palate Neck: Trachea midline; neck supple, no JVD Lungs/chest: CTA, with normal respiratory effort and no intercostal retractions CV: RRR, no MRGs  Abdomen: Soft, non-tender; no masses or HSM Extremities: No peripheral edema or extremity lymphadenopathy Skin: Normal temperature, turgor and texture; no rash, ulcers or subcutaneous nodules Psych: Appropriate affect, alert and oriented to person, place and time    Recent Labs Lab 07/08/16 1437 07/09/16 0726  NA 135 134*  K 3.8 4.1  CL 102 101  CO2 25 22  BUN 38* 32*  CREATININE 3.12* 2.94*  GLUCOSE 120* 69    Recent Labs Lab 07/08/16 1437 07/09/16 0726  HGB 10.0* 8.9*  HCT 31.9* 27.6*  WBC 14.6* 11.9*  PLT 426* 333   Ct Abdomen Pelvis  Wo Contrast  Result Date: 07/08/2016 CLINICAL DATA:  Abdominal pain.  Acute kidney injury. EXAM: CT ABDOMEN AND PELVIS WITHOUT CONTRAST TECHNIQUE: Multidetector CT imaging of the abdomen and pelvis was performed following the standard  protocol without IV contrast. COMPARISON:  CT 2 weeks prior 06/27/2016 FINDINGS: Lower chest: Lung bases are clear. Heart is prominent size. No pleural fluid. Hepatobiliary: Calcified gallstone without abnormal gallbladder distention. No evidence of pericholecystic inflammation. No focal hepatic lesion allowing for noncontrast exam. Pancreas: Suboptimally assessed due to paucity of body fat and lack of intravenous contrast, no gross inflammation or ductal dilatation. Spleen:  Normal in size. Adrenals/Urinary Tract: No evidence of adrenal nodule. No hydronephrosis. Probable right lower pole renal scarring. Increased density of the renal medulla on the left suggesting nephrocalcinosis. No perinephric edema. Ureters are decompressed and not well evaluated. Urinary bladder is physiologically distended without stone. Stomach/Bowel: Gastric wall thickening about the cardia. More distal stomach is normal. Intraluminal low density in the second and third portion of the duodenum is likely secondary to ingested material versus focal wall abnormality. No evidence of bowel obstruction, enteric contrast reaches the ileal bowel loops. Appendix contains intraluminal high-density, may be from prior enteric contrast administration. No evidence of colonic wall thickening or inflammation. Vascular/Lymphatic: Aortic atherosclerosis without aneurysm. No bulky adenopathy within limitations of noncontrast exam and paucity of intra-abdominal fat. Reproductive: Right adnexal cyst measures 2.8 cm with peripheral calcification, unchanged from prior exam allowing for differences in caliper placement. Uterus is quiescent. Left ovary not confidently identified. Other: Small fat containing umbilical hernia without bowel involvement. No evidence of inflammation. Minimal mesenteric edema and trace fluid adjacent to the inferior liver tip and in the pelvis. No free air. Musculoskeletal: There are no acute or suspicious osseous abnormalities. The  bones are under mineralized. IMPRESSION: 1. Gastric wall thickening about the cardia, nonspecific may be infectious or inflammatory. Otherwise no acute abnormality. 2. Stable chronic findings of cholelithiasis, fat containing umbilical hernia, left renal nephrocalcinosis, aortic atherosclerosis and peripherally calcified right adnexal cyst. Electronically Signed   By: Jeb Levering M.D.   On: 07/08/2016 18:59   Dg Chest 2 View  Result Date: 07/08/2016 CLINICAL DATA:  Cough EXAM: CHEST  2 VIEW COMPARISON:  None. FINDINGS: Heart is upper limits normal in size. Lungs are clear. No effusions. No acute bony abnormality. IMPRESSION: No active cardiopulmonary disease. Electronically Signed   By: Rolm Baptise M.D.   On: 07/08/2016 16:57    ASSESSMENT / PLAN: Hypertensive urgency (resolved). Likely d/t medical non-compliance and pred dosing.  Plan Dc nicardipine gtt cont amlodipine, Coreg (have increased coreg to 6.25 bid and norvasc to 10).    Acute on chronic kidney disease. W/ concern for lupus nephritis. It appears as though her baseline creatinine is in the 1.89 to 2.8 range. Her cellcept was recently increased and pred taper w/ plan to keep at 20mg /d  ->her chemistry is a little better w/ gentle hydration Plan Cont gentle hydration  Cont current dosing of prednisone  Spoke to Dr Justin Mend. He recommends renal biopsy IR will need to do this, He will assist w/ her care   Abd pain. Has h/o umbilical hernia. CT w/out evidence of incarceration and it is reducible Plan Supportive care  SLE Plan Cont cellcept, plaquenil and pred   Best Practice VTE ppx: heparin SQ SUP: PO protonix Dispo:  Medical renal-->IM service to assume care. Renal consult and IR consult pending for question of lupus nephritis and need for bx.   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  Patient was noted to be hypertensive and cardene was started and admitted to ICU.  On exam, she is off cardene drip with clear lungs.  I reviewed CXR, cardiomegaly without pulmonary edema noted.  Discussed with PCCM-NP and TRH-MD.  Hypertension:  - Increased Coreg  - D/C cardene  - Norvasc to 10 mg daily  A on C renal failure:  - Hydrate  - Nephrology consult  - Steroids as ordered  Lupus:  - Renal biopsy  - Steroids  Hypoxemia:  - Titrate O2 for sat of 88-92%.  - May need ambulatory desat study prior to d/c  Transfer to med-surg and to [facility] service with PCCM off 3/3.  Patient seen and examined, agree with above note.  I dictated the care and orders written for this patient under my direction.  Rush Farmer, MD [phone]

## 2016-07-09 NOTE — Progress Notes (Signed)
New Admission Note:   Arrival Method: Bed  Mental Orientation: A&O X4 Telemetry: No orders Assessment: Completed Skin: Clean, Dry, Intact Iv: Infusing Pain: Denies Admission: Completed Unit Orientation: Patient has been orientated to the room, unit and staff.  Family: Husband at bedside  Orders have been reviewed and implemented. Will continue to monitor the patient. Call light has been placed within reach and bed alarm has been activated.    Dixie Dials RN, BSN

## 2016-07-09 NOTE — Consult Note (Signed)
Referring Provider: No ref. provider found Primary Care Physician:  Pcp Not In System Primary Nephrologist:    Reason for Consultation:  Acute on chronic renal disease  History of  SLE and Hypertensive crisis   HPI: 56 year old female with past medical history as below, which is significant for systemic lupus erythematous (followed loosely by Dr. Annabelle Harman at [facility] rheumatology), vasculitis, COPD, hypertension, seizures, and suicide attempt. She was recently admitted to [facility] with presenting diagnosis of weakness. She was found be symptomatically anemic requiring blood transfusion. It was also found that she had not been taking her Plaquenil, CellCept, or prednisone and these were restarted. Her serum creatinine was also elevated and this was thought to be secondary to lupus nephritis, although this had not been confirmed. Since discharge she had been doing okay until about 2/26 when she started having weakness again. 3/1 She called her nephrology office who reviewed the labs for her admission and is growing increasingly worried about lupus nephritis. He increased her dose of prednisone and asked that she present to the nearest emergency department.  3/1 she presented [facility] emergency department with chief complaint abdominal pain and weakness. Abdominal pain complaint is consistent with umbilical hernia that she has had for some time but has just recently become painful in recent months. She was also found to be profoundly hypertensive with systolic blood pressure 174. She was started on nicardipine infusion after not responding to by mouth amlodipine. She underwent CT of her abdomen and pelvis which again demonstrated umbilical hernia and no other cause for acute abdominal pain. She is unable to be liberated from the carpet infusion, therefore PCCM has been asked to admit to ICU.   Past Medical History:  Diagnosis Date  . Acute renal failure (ARF) ([place])   . Anemia    . Arthritis   . Cardiac arrest ([place])   . Cellulitis   . Cerebral vasculitis   . COPD (chronic obstructive pulmonary disease) ([place])   . Elevated antinuclear antibody (ANA) level   . Endometriosis   . Gallstones   . Hypertension   . Lupus   . Migraine   . Seizures ([place])   . Sepsis ([place])   . Suicide attempt   . TB (tuberculosis)   . Transient ischemic attack (TIA)   . Vasculitis of skin     Past Surgical History:  Procedure Laterality Date  . CESAREAN SECTION    . TUBAL LIGATION      Prior to Admission medications   Medication Sig Start Date End Date Taking? Authorizing Provider  cholecalciferol (VITAMIN D) 1000 units tablet Take 1,000 Units by mouth daily.   Yes Historical Provider, MD  felodipine (PLENDIL) 5 MG 24 hr tablet Take 2 tablets (10 mg total) by mouth daily. 06/29/16  Yes Patrecia Pour, MD  hydroxychloroquine (PLAQUENIL) 200 MG tablet Take 1.5 tablets (300 mg total) by mouth daily. 06/29/16  Yes Patrecia Pour, MD  mycophenolate (CELLCEPT) 500 MG tablet Take 2 tablets (1,000 mg total) by mouth 2 (two) times daily. 06/29/16  Yes Patrecia Pour, MD  predniSONE (DELTASONE) 20 MG tablet Take 2 tablets (40 mg total) by mouth daily with breakfast. Take two tablets by mouth daily until rheumatology follow up. 06/29/16  Yes Patrecia Pour, MD  sodium bicarbonate 650 MG tablet Take 1 tablet (650 mg total) by mouth daily. 06/29/16  Yes Patrecia Pour, MD  vitamin C (ASCORBIC ACID) 500 MG tablet Take 500 mg  by mouth daily.   Yes Historical Provider, MD  guaifenesin (HUMIBID E) 400 MG TABS tablet Take 400 mg by mouth 2 (two) times daily.    Historical Provider, MD  pantoprazole (PROTONIX) 40 MG tablet Take 40 mg by mouth 2 (two) times daily.    Historical Provider, MD    Current Facility-Administered Medications  Medication Dose Route Frequency Provider Last Rate Last Dose  . 0.9 %  sodium chloride infusion   Intravenous Continuous Chesley Mires, MD 50 mL/hr at 07/08/16 2157    . [START ON  07/10/2016] amLODipine (NORVASC) tablet 10 mg  10 mg Oral Daily Erick Colace, NP      . carvedilol (COREG) tablet 6.25 mg  6.25 mg Oral BID WC Erick Colace, NP      . fentaNYL (SUBLIMAZE) injection 50 mcg  50 mcg Intravenous Q2H PRN Chesley Mires, MD   50 mcg at 07/09/16 0241  . heparin injection 5,000 Units  5,000 Units Subcutaneous Q8H Corey Harold, NP   5,000 Units at 07/09/16 0550  . hydroxychloroquine (PLAQUENIL) tablet 300 mg  300 mg Oral Daily Corey Harold, NP   300 mg at 07/09/16 1054  . mycophenolate (CELLCEPT) capsule 1,000 mg  1,000 mg Oral BID Corey Harold, NP   1,000 mg at 07/09/16 1054  . nicardipine (CARDENE) 20mg  in 0.86% saline 284ml IV infusion (0.1 mg/ml)  3-15 mg/hr Intravenous Continuous Corey Harold, NP   Stopped at 07/08/16 2330  . pantoprazole (PROTONIX) EC tablet 40 mg  40 mg Oral Daily Corey Harold, NP   40 mg at 07/09/16 1054  . predniSONE (DELTASONE) tablet 50 mg  50 mg Oral Q breakfast Corey Harold, NP   50 mg at 07/09/16 0750    Allergies as of 07/08/2016  . (No Known Allergies)    No family history on file.  Social History   Social History  . Marital status: Single    Spouse name: N/A  . Number of children: N/A  . Years of education: N/A   Occupational History  . Not on file.   Social History Main Topics  . Smoking status: Current Some Day Smoker    Packs/day: 0.50    Types: Cigarettes  . Smokeless tobacco: Never Used  . Alcohol use No  . Drug use: No  . Sexual activity: Not on file   Other Topics Concern  . Not on file   Social History Narrative  . No narrative on file    Review of Systems: Gen: Denies any fever, chills, sweats, anorexia, fatigue, weakness, malaise, weight loss, and sleep disorder HEENT: No visual complaints, No history of Retinopathy. Normal external appearance No Epistaxis or Sore throat. No sinusitis.   CV: Denies chest pain, angina, palpitations, syncope, orthopnea, PND, peripheral edema, and  claudication. Resp: Denies dyspnea at rest, dyspnea with exercise, cough, sputum, wheezing, coughing up blood, and pleurisy. GI: Denies vomiting blood, jaundice, and fecal incontinence.   Denies dysphagia or odynophagia. GU : Denies urinary burning, blood in urine, urinary frequency, urinary hesitancy, nocturnal urination, and urinary incontinence.  No renal calculi. MS: Denies joint pain, limitation of movement, and swelling, stiffness, low back pain, extremity pain. Denies muscle weakness, cramps, atrophy.  No use of non steroidal antiinflammatory drugs. Derm: Denies rash, itching, dry skin, hives, moles, warts, or unhealing ulcers.  Psych: Denies depression, anxiety, memory loss, suicidal ideation, hallucinations, paranoia, and confusion. Heme: Denies bruising, bleeding, and enlarged lymph nodes. Neuro: No headache.  No diplopia. No dysarthria.  No dysphasia.  No history of CVA.  No Seizures. No paresthesias.  No weakness. Endocrine No DM.  No Thyroid disease.  No Adrenal disease.  Physical Exam: Vital signs in last 24 hours: Temp:  [98 F (36.7 C)-101.8 F (38.8 C)] 98.8 F (37.1 C) (03/02 0830) Pulse Rate:  [71-107] 79 (03/02 1130) Resp:  [12-28] 13 (03/02 1130) BP: (127-211)/(92-137) 133/100 (03/02 1130) SpO2:  [98 %-100 %] 100 % (03/02 1130) FiO2 (%):  [0 %] 0 % (03/01 2003) Weight:  [111 lb 5.3 oz (50.5 kg)-120 lb (54.4 kg)] 111 lb 5.3 oz (50.5 kg) (03/01 2248) Last BM Date: 07/08/16 General:   Alert,  Well-developed, well-nourished, pleasant and cooperative in NAD Head:  Normocephalic and atraumatic. Eyes:  Sclera clear, no icterus.   Conjunctiva pink. Ears:  Normal auditory acuity. Nose:  No deformity, discharge,  or lesions. Mouth:  No deformity or lesions, dentition normal. Neck:  Supple; no masses or thyromegaly. JVP not elevated Lungs:  Clear throughout to auscultation.   No wheezes, crackles, or rhonchi. No acute distress. Heart:  Regular rate and rhythm; no murmurs,  clicks, rubs,  or gallops. Abdomen:  Soft, nontender and nondistended. No masses, hepatosplenomegaly or hernias noted. Normal bowel sounds, without guarding, and without rebound.   Msk:  Symmetrical without gross deformities. Normal posture. Pulses:  No carotid, renal, femoral bruits. DP and PT symmetrical and equal Extremities:  Without clubbing or edema. Neurologic:  Alert and  oriented x4;  grossly normal neurologically. Skin:  Intact without significant lesions or rashes. Cervical Nodes:  No significant cervical adenopathy. Psych:  Alert and cooperative. Normal mood and affect.  Intake/Output from previous day: 03/01 0701 - 03/02 0700 In: 1000 [IV Piggyback:1000] Out: -  Intake/Output this shift: No intake/output data recorded.  Lab Results:  Recent Labs  07/08/16 1437 07/09/16 0726  WBC 14.6* 11.9*  HGB 10.0* 8.9*  HCT 31.9* 27.6*  PLT 426* 333   BMET  Recent Labs  07/08/16 1437 07/09/16 0726  NA 135 134*  K 3.8 4.1  CL 102 101  CO2 25 22  GLUCOSE 120* 69  BUN 38* 32*  CREATININE 3.12* 2.94*  CALCIUM 8.9 8.6*  PHOS  --  4.2   LFT  Recent Labs  07/08/16 1437  PROT 6.8  ALBUMIN 3.2*  AST 17  ALT 11*  ALKPHOS 62  BILITOT 0.7   PT/INR No results for input(s): LABPROT, INR in the last 72 hours. Hepatitis Panel No results for input(s): HEPBSAG, HCVAB, HEPAIGM, HEPBIGM in the last 72 hours.  Studies/Results: Ct Abdomen Pelvis Wo Contrast  Result Date: 07/08/2016 CLINICAL DATA:  Abdominal pain.  Acute kidney injury. EXAM: CT ABDOMEN AND PELVIS WITHOUT CONTRAST TECHNIQUE: Multidetector CT imaging of the abdomen and pelvis was performed following the standard protocol without IV contrast. COMPARISON:  CT 2 weeks prior 06/27/2016 FINDINGS: Lower chest: Lung bases are clear. Heart is prominent size. No pleural fluid. Hepatobiliary: Calcified gallstone without abnormal gallbladder distention. No evidence of pericholecystic inflammation. No focal hepatic lesion  allowing for noncontrast exam. Pancreas: Suboptimally assessed due to paucity of body fat and lack of intravenous contrast, no gross inflammation or ductal dilatation. Spleen:  Normal in size. Adrenals/Urinary Tract: No evidence of adrenal nodule. No hydronephrosis. Probable right lower pole renal scarring. Increased density of the renal medulla on the left suggesting nephrocalcinosis. No perinephric edema. Ureters are decompressed and not well evaluated. Urinary bladder is physiologically distended without stone. Stomach/Bowel: Gastric wall  thickening about the cardia. More distal stomach is normal. Intraluminal low density in the second and third portion of the duodenum is likely secondary to ingested material versus focal wall abnormality. No evidence of bowel obstruction, enteric contrast reaches the ileal bowel loops. Appendix contains intraluminal high-density, may be from prior enteric contrast administration. No evidence of colonic wall thickening or inflammation. Vascular/Lymphatic: Aortic atherosclerosis without aneurysm. No bulky adenopathy within limitations of noncontrast exam and paucity of intra-abdominal fat. Reproductive: Right adnexal cyst measures 2.8 cm with peripheral calcification, unchanged from prior exam allowing for differences in caliper placement. Uterus is quiescent. Left ovary not confidently identified. Other: Small fat containing umbilical hernia without bowel involvement. No evidence of inflammation. Minimal mesenteric edema and trace fluid adjacent to the inferior liver tip and in the pelvis. No free air. Musculoskeletal: There are no acute or suspicious osseous abnormalities. The bones are under mineralized. IMPRESSION: 1. Gastric wall thickening about the cardia, nonspecific may be infectious or inflammatory. Otherwise no acute abnormality. 2. Stable chronic findings of cholelithiasis, fat containing umbilical hernia, left renal nephrocalcinosis, aortic atherosclerosis and  peripherally calcified right adnexal cyst. Electronically Signed   By: Jeb Levering M.D.   On: 07/08/2016 18:59   Dg Chest 2 View  Result Date: 07/08/2016 CLINICAL DATA:  Cough EXAM: CHEST  2 VIEW COMPARISON:  None. FINDINGS: Heart is upper limits normal in size. Lungs are clear. No effusions. No acute bony abnormality. IMPRESSION: No active cardiopulmonary disease. Electronically Signed   By: Rolm Baptise M.D.   On: 07/08/2016 16:57    Assessment/Plan:   Systemic Lupus with increase in serum creatinine and acute on chronic renal failure  Baseline creatinine 1.7 that has increased to above 3 in setting of hypertensive crisis. It appears that there is concern about lupus nephritis in the chart as contributing to her acute on chronic renal disease and a referral made to nephrology for a renal biopsy although this does not appear to have been done.   Hypertension  Appears to have no evidence of volume overload  Will recommend proceeding with renal biopsy to rule out lupus nephritis   LOS: 1 Julienne Vogler W @TODAY @1 :04 PM

## 2016-07-09 NOTE — Consult Note (Signed)
Chief Complaint: Patient was seen in consultation today for random renal biopsy Chief Complaint  Patient presents with  . Abnormal Lab    Pt was called by PMD for abnormal labs and advised to come in due to worsening kidney function    at the request of Dr Willy Eddy  Referring Physician(s): Dr Willy Eddy  Supervising Physician: Arne Cleveland  Patient Status: [facility] - In-pt  History of Present Illness: Kathleen [name] is a 56 y.o. female   Acute/Chronic renal failure Systemic Lupus erythematous; COPD; vasculitis; HTN; Seizures To ED 3/1 with weakness; abdominal pain Anemia and transfusion She has not been compliant with medications Noted rising creatinine Concerning for lupus nephritis Request for biopsy per Nephrologist  Plan for bx in Radiology Mon 3/5   Past Medical History:  Diagnosis Date  . Acute renal failure (ARF) ([place])   . Anemia   . Arthritis   . Cardiac arrest ([place])   . Cellulitis   . Cerebral vasculitis   . COPD (chronic obstructive pulmonary disease) ([place])   . Elevated antinuclear antibody (ANA) level   . Endometriosis   . Gallstones   . Hypertension   . Lupus   . Migraine   . Seizures ([place])   . Sepsis ([place])   . Suicide attempt   . TB (tuberculosis)   . Transient ischemic attack (TIA)   . Vasculitis of skin     Past Surgical History:  Procedure Laterality Date  . CESAREAN SECTION    . TUBAL LIGATION      Allergies: Patient has no known allergies.  Medications: Prior to Admission medications   Medication Sig Start Date End Date Taking? Authorizing Provider  cholecalciferol (VITAMIN D) 1000 units tablet Take 1,000 Units by mouth daily.   Yes Historical Provider, MD  felodipine (PLENDIL) 5 MG 24 hr tablet Take 2 tablets (10 mg total) by mouth daily. 06/29/16  Yes Patrecia Pour, MD  hydroxychloroquine (PLAQUENIL) 200 MG tablet Take 1.5 tablets (300 mg total) by mouth daily. 06/29/16  Yes Patrecia Pour, MD  mycophenolate (CELLCEPT) 500 MG tablet  Take 2 tablets (1,000 mg total) by mouth 2 (two) times daily. 06/29/16  Yes Patrecia Pour, MD  predniSONE (DELTASONE) 20 MG tablet Take 2 tablets (40 mg total) by mouth daily with breakfast. Take two tablets by mouth daily until rheumatology follow up. 06/29/16  Yes Patrecia Pour, MD  sodium bicarbonate 650 MG tablet Take 1 tablet (650 mg total) by mouth daily. 06/29/16  Yes Patrecia Pour, MD  vitamin C (ASCORBIC ACID) 500 MG tablet Take 500 mg by mouth daily.   Yes Historical Provider, MD  guaifenesin (HUMIBID E) 400 MG TABS tablet Take 400 mg by mouth 2 (two) times daily.    Historical Provider, MD  pantoprazole (PROTONIX) 40 MG tablet Take 40 mg by mouth 2 (two) times daily.    Historical Provider, MD     No family history on file.  Social History   Social History  . Marital status: Single    Spouse name: N/A  . Number of children: N/A  . Years of education: N/A   Social History Main Topics  . Smoking status: Current Some Day Smoker    Packs/day: 0.50    Types: Cigarettes  . Smokeless tobacco: Never Used  . Alcohol use No  . Drug use: No  . Sexual activity: Not Asked   Other Topics Concern  . None   Social History Narrative  .  None      Review of Systems: A 12 point ROS discussed and pertinent positives are indicated in the HPI above.  All other systems are negative.  Review of Systems  Constitutional: Positive for activity change, appetite change and fatigue. Negative for fever.  Respiratory: Negative for cough, chest tightness and shortness of breath.   Cardiovascular: Negative for chest pain.  Gastrointestinal: Negative for abdominal pain.  Musculoskeletal: Negative for back pain.  Psychiatric/Behavioral: Negative for confusion and decreased concentration.    Vital Signs: BP (!) 133/100   Pulse 79   Temp 98.8 F (37.1 C) (Oral)   Resp 13   Ht 5\' 4"  (1.626 m)   Wt 111 lb 5.3 oz (50.5 kg)   SpO2 100%   BMI 19.11 kg/m   Physical Exam  Constitutional: She is  oriented to person, place, and time.  Cardiovascular: Normal rate, regular rhythm and normal heart sounds.   Pulmonary/Chest: Effort normal and breath sounds normal.  Abdominal: Soft. Bowel sounds are normal.  Musculoskeletal: Normal range of motion.  Neurological: She is alert and oriented to person, place, and time.  Skin: Skin is warm and dry.  Psychiatric: She has a normal mood and affect. Her behavior is normal. Judgment and thought content normal.  Nursing note and vitals reviewed.   Mallampati Score:  MD Evaluation Airway: WNL Heart: WNL Abdomen: WNL Chest/ Lungs: WNL ASA  Classification: 3 Mallampati/Airway Score: One  Imaging: Ct Abdomen Pelvis Wo Contrast  Result Date: 07/08/2016 CLINICAL DATA:  Abdominal pain.  Acute kidney injury. EXAM: CT ABDOMEN AND PELVIS WITHOUT CONTRAST TECHNIQUE: Multidetector CT imaging of the abdomen and pelvis was performed following the standard protocol without IV contrast. COMPARISON:  CT 2 weeks prior 06/27/2016 FINDINGS: Lower chest: Lung bases are clear. Heart is prominent size. No pleural fluid. Hepatobiliary: Calcified gallstone without abnormal gallbladder distention. No evidence of pericholecystic inflammation. No focal hepatic lesion allowing for noncontrast exam. Pancreas: Suboptimally assessed due to paucity of body fat and lack of intravenous contrast, no gross inflammation or ductal dilatation. Spleen:  Normal in size. Adrenals/Urinary Tract: No evidence of adrenal nodule. No hydronephrosis. Probable right lower pole renal scarring. Increased density of the renal medulla on the left suggesting nephrocalcinosis. No perinephric edema. Ureters are decompressed and not well evaluated. Urinary bladder is physiologically distended without stone. Stomach/Bowel: Gastric wall thickening about the cardia. More distal stomach is normal. Intraluminal low density in the second and third portion of the duodenum is likely secondary to ingested material  versus focal wall abnormality. No evidence of bowel obstruction, enteric contrast reaches the ileal bowel loops. Appendix contains intraluminal high-density, may be from prior enteric contrast administration. No evidence of colonic wall thickening or inflammation. Vascular/Lymphatic: Aortic atherosclerosis without aneurysm. No bulky adenopathy within limitations of noncontrast exam and paucity of intra-abdominal fat. Reproductive: Right adnexal cyst measures 2.8 cm with peripheral calcification, unchanged from prior exam allowing for differences in caliper placement. Uterus is quiescent. Left ovary not confidently identified. Other: Small fat containing umbilical hernia without bowel involvement. No evidence of inflammation. Minimal mesenteric edema and trace fluid adjacent to the inferior liver tip and in the pelvis. No free air. Musculoskeletal: There are no acute or suspicious osseous abnormalities. The bones are under mineralized. IMPRESSION: 1. Gastric wall thickening about the cardia, nonspecific may be infectious or inflammatory. Otherwise no acute abnormality. 2. Stable chronic findings of cholelithiasis, fat containing umbilical hernia, left renal nephrocalcinosis, aortic atherosclerosis and peripherally calcified right adnexal cyst. Electronically Signed  By: Jeb Levering M.D.   On: 07/08/2016 18:59   Ct Abdomen Pelvis Wo Contrast  Result Date: 06/27/2016 CLINICAL DATA:  Diffuse abdominal pain with persistent vomiting EXAM: CT ABDOMEN AND PELVIS WITHOUT CONTRAST TECHNIQUE: Multidetector CT imaging of the abdomen and pelvis was performed following the standard protocol without IV contrast. COMPARISON:  None. FINDINGS: Lower chest: Bleb in the right lower lobe. No acute consolidation or effusion. Heart size appears slightly enlarged. Hepatobiliary: No focal hepatic abnormality. Calcified stones in the gallbladder. No wall thickening. No biliary dilatation. Pancreas: Unremarkable. No pancreatic  ductal dilatation or surrounding inflammatory changes. Spleen: Normal in size without focal abnormality. Adrenals/Urinary Tract: Adrenal glands are within normal limits. No hydronephrosis. Probable areas of cortical scarring in the right kidney. Hyperdensities in the left kidney, some of which appear to be within the medullary portion, suggestive of nephrocalcinosis. Others may represent parenchymal calcifications within the left kidney. No ureteral stones. Bladder normal Stomach/Bowel: Stomach is nonenlarged. No dilated small bowel. No colon wall thickening. Increased density within the lumen of the appendix but no inflammatory changes. Vascular/Lymphatic: Calcified aorta. No grossly enlarged lymph nodes Reproductive: Uterus grossly unremarkable. 2.6 cm rim calcified cyst in the right ovary. Other: No free air or free fluid.  Fat containing umbilical hernia. Musculoskeletal: No acute or suspicious bone lesions. IMPRESSION: 1. No CT evidence for a bowel obstruction. Appendicolith but no evidence for appendicitis. 2. 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 Others may represent parenchymal calcifications. Suspect cortical scarring in the lower pole of the right kidney. 3. 2.6 cm rim calcified cyst in the right ovary ; ultrasound may be obtained for further evaluation. Electronically Signed   By: Donavan Foil M.D.   On: 06/27/2016 23:45   Dg Chest 2 View  Result Date: 07/08/2016 CLINICAL DATA:  Cough EXAM: CHEST  2 VIEW COMPARISON:  None. FINDINGS: Heart is upper limits normal in size. Lungs are clear. No effusions. No acute bony abnormality. IMPRESSION: No active cardiopulmonary disease. Electronically Signed   By: Rolm Baptise M.D.   On: 07/08/2016 16:57    Labs:  CBC:  Recent Labs  06/28/16 1531 06/29/16 0830 07/08/16 1437 07/09/16 0726  WBC 5.9 13.8* 14.6* 11.9*  HGB 8.3* 8.7* 10.0* 8.9*  HCT 25.8* 27.0* 31.9* 27.6*  PLT 374 466*  426* 333    COAGS:  Recent Labs  06/28/16 0336  INR 1.42    BMP:  Recent Labs  06/28/16 1531 06/29/16 0830 07/08/16 1437 07/09/16 0726  NA 136 139 135 134*  K 5.2* 4.9 3.8 4.1  CL 109 108 102 101  CO2 17* 21* 25 22  GLUCOSE 157* 105* 120* 69  BUN 20 26* 38* 32*  CALCIUM 8.9 9.4 8.9 8.6*  CREATININE 2.88* 2.81* 3.12* 2.94*  GFRNONAA 17* 18* 16* 17*  GFRAA 20* 21* 18* 20*    LIVER FUNCTION TESTS:  Recent Labs  06/27/16 1945 06/28/16 0336 07/08/16 1437  BILITOT 0.5 0.4 0.7  AST 17 14* 17  ALT 5* <5* 11*  ALKPHOS 49 36* 62  PROT 5.8* 4.4* 6.8  ALBUMIN 2.3* 1.8* 3.2*    TUMOR MARKERS: No results for input(s): AFPTM, CEA, CA199, CHROMGRNA in the last 8760 hours.  Assessment and Plan:  Rising creatinine A/C renal failure Hx SLE Concerned for lupus nephritis Scheduled now for random renal biopsy Plan for Rad procedure Mon 3/5 Risks and Benefits discussed with the patient including, but not limited to bleeding, infection,  damage to adjacent structures or low yield requiring additional tests. All of the patient's questions were answered, patient is agreeable to proceed. Consent signed and in chart.   Thank you for this interesting consult.  I greatly enjoyed meeting Raeden Belzer and look forward to participating in their care.  A copy of this report was sent to the requesting provider on this date.  Electronically Signed: Monia Sabal A 07/09/2016, 1:56 PM   I spent a total of 20 Minutes    in face to face in clinical consultation, greater than 50% of which was counseling/coordinating care for random renal biopsy

## 2016-07-10 LAB — BASIC METABOLIC PANEL
Anion gap: 9 (ref 5–15)
BUN: 43 mg/dL — AB (ref 6–20)
CO2: 21 mmol/L — ABNORMAL LOW (ref 22–32)
CREATININE: 3.39 mg/dL — AB (ref 0.44–1.00)
Calcium: 8.8 mg/dL — ABNORMAL LOW (ref 8.9–10.3)
Chloride: 103 mmol/L (ref 101–111)
GFR calc Af Amer: 16 mL/min — ABNORMAL LOW (ref >60)
GFR, EST NON AFRICAN AMERICAN: 14 mL/min — AB (ref >60)
Glucose, Bld: 132 mg/dL — ABNORMAL HIGH (ref 65–99)
Potassium: 3.7 mmol/L (ref 3.5–5.1)
SODIUM: 133 mmol/L — AB (ref 135–145)

## 2016-07-10 LAB — CBC
HCT: 24.8 % — ABNORMAL LOW (ref 36.0–46.0)
Hemoglobin: 8.1 g/dL — ABNORMAL LOW (ref 12.0–15.0)
MCH: 27.5 pg (ref 26.0–34.0)
MCHC: 32.7 g/dL (ref 30.0–36.0)
MCV: 84.1 fL (ref 78.0–100.0)
PLATELETS: 325 10*3/uL (ref 150–400)
RBC: 2.95 MIL/uL — ABNORMAL LOW (ref 3.87–5.11)
RDW: 15.6 % — ABNORMAL HIGH (ref 11.5–15.5)
WBC: 13.4 10*3/uL — ABNORMAL HIGH (ref 4.0–10.5)

## 2016-07-10 MED ORDER — SENNOSIDES-DOCUSATE SODIUM 8.6-50 MG PO TABS
2.0000 | ORAL_TABLET | Freq: Two times a day (BID) | ORAL | Status: DC
  Administered 2016-07-10 – 2016-07-16 (×11): 2 via ORAL
  Filled 2016-07-10 (×13): qty 2

## 2016-07-10 NOTE — Progress Notes (Signed)
[facility] ROUNDING NOTE   Subjective:   Interval History:  : 56 year old female with past medical history as below, which is significant for systemic lupus erythematous (followed loosely by Dr. Annabelle Harman at [facility] rheumatology), vasculitis, COPD, hypertension, seizures, and suicide attempt. She was recently admitted to [facility] with presenting diagnosis of weakness. She was found be symptomatically anemic requiring blood transfusion. It was also found that she had not been taking her Plaquenil, CellCept, or prednisone and these were restarted. Her serum creatinine was also elevated and this was thought to be secondary to lupus nephritis, although this had not been confirmed. Since discharge she had been doing okay until about 2/26 when she started having weakness again. 3/1 She called her nephrology office who reviewed the labs for her admission and is growing increasingly worried about lupus nephritis. He increased her dose of prednisone and asked that she present to the nearest emergency department.  3/1 she presented [facility] emergency department with chief complaint abdominal pain and weakness. Abdominal pain complaint is consistent with umbilical hernia that she has had for some time but has just recently become painful in recent months. She was also found to be profoundly hypertensive with systolic blood pressure 671. She was started on nicardipine infusion after not responding to by mouth amlodipine. She underwent CT of her abdomen and pelvis which again demonstrated umbilical hernia and no other cause for acute abdominal pain. She is unable to be liberated from the carpet infusion, therefore PCCM has been asked to admit to ICU.  Patient subsequently transferred to floor with good control of blood pressure and creatinine that has run in the 2.5 - 3 range  She has an inactive urine sediment with only small amount of proteinuria. She may not have active  lupus nephritis although complements were low in [facility] and they had concerns about lupus nephritis. Will proceed with renal biopsy Monday   Objective:  Vital signs in last 24 hours:  Temp:  [98.3 F (36.8 C)-98.6 F (37 C)] 98.3 F (36.8 C) (03/03 0457) Pulse Rate:  [71-79] 74 (03/03 0457) Resp:  [13-19] 18 (03/03 0457) BP: (123-171)/(87-105) 144/89 (03/03 0457) SpO2:  [100 %] 100 % (03/03 0457) Weight:  [111 lb 5.3 oz (50.5 kg)] 111 lb 5.3 oz (50.5 kg) (03/03 0241)  Weight change: -8 lb 10.7 oz (-3.932 kg) Filed Weights   07/08/16 1405 07/08/16 2248 07/10/16 0241  Weight: 120 lb (54.4 kg) 111 lb 5.3 oz (50.5 kg) 111 lb 5.3 oz (50.5 kg)    Intake/Output: I/O last 3 completed shifts: In: 1692.5 [P.O.:240; I.V.:1452.5] Out: 0    Intake/Output this shift:  No intake/output data recorded.  CVS- RRR RS- CTA ABD- BS present soft non-distended EXT- no edema   Basic Metabolic Panel:  Recent Labs Lab 07/08/16 1437 07/09/16 0726  NA 135 134*  K 3.8 4.1  CL 102 101  CO2 25 22  GLUCOSE 120* 69  BUN 38* 32*  CREATININE 3.12* 2.94*  CALCIUM 8.9 8.6*  MG  --  1.4*  PHOS  --  4.2    Liver Function Tests:  Recent Labs Lab 07/08/16 1437  AST 17  ALT 11*  ALKPHOS 62  BILITOT 0.7  PROT 6.8  ALBUMIN 3.2*   No results for input(s): LIPASE, AMYLASE in the last 168 hours. No results for input(s): AMMONIA in the last 168 hours.  CBC:  Recent Labs Lab 07/08/16 1437 07/09/16 0726  WBC 14.6* 11.9*  NEUTROABS 12.3*  --   HGB 10.0* 8.9*  HCT 31.9* 27.6*  MCV 85.1 85.2  PLT 426* 333    Cardiac Enzymes:  Recent Labs Lab 07/08/16 2113 07/09/16 0215 07/09/16 0726  TROPONINI 0.21* 0.17* 0.14*    BNP: Invalid input(s): POCBNP  CBG:  Recent Labs Lab 07/08/16 1407 07/09/16 1309  GLUCAP 101* 133*    Microbiology: Results for orders placed or performed during the hospital encounter of 07/08/16  Blood Culture (routine x 2)     Status: None  (Preliminary result)   Collection Time: 07/08/16  2:30 PM  Result Value Ref Range Status   Specimen Description BLOOD LEFT ANTECUBITAL  Final   Special Requests BOTTLES DRAWN AEROBIC AND ANAEROBIC 5CC  Final   Culture NO GROWTH 1 DAY  Final   Report Status PENDING  Incomplete  Blood Culture (routine x 2)     Status: None (Preliminary result)   Collection Time: 07/08/16  2:49 PM  Result Value Ref Range Status   Specimen Description BLOOD RIGHT ANTECUBITAL  Final   Special Requests BOTTLES DRAWN AEROBIC AND ANAEROBIC 10CC  Final   Culture NO GROWTH < 24 HOURS  Final   Report Status PENDING  Incomplete  MRSA PCR Screening     Status: None   Collection Time: 07/08/16 10:51 PM  Result Value Ref Range Status   MRSA by PCR NEGATIVE NEGATIVE Final    Comment:        The GeneXpert MRSA Assay (FDA approved for NASAL specimens only), is one component of a comprehensive MRSA colonization surveillance program. It is not intended to diagnose MRSA infection nor to guide or monitor treatment for MRSA infections.     Coagulation Studies: No results for input(s): LABPROT, INR in the last 72 hours.  Urinalysis:  Recent Labs  07/08/16 1715  COLORURINE COLORLESS*  LABSPEC 1.005  PHURINE 7.0  GLUCOSEU NEGATIVE  HGBUR SMALL*  BILIRUBINUR NEGATIVE  KETONESUR NEGATIVE  PROTEINUR 100*  NITRITE NEGATIVE  LEUKOCYTESUR NEGATIVE      Imaging: Ct Abdomen Pelvis Wo Contrast  Result Date: 07/08/2016 CLINICAL DATA:  Abdominal pain.  Acute kidney injury. EXAM: CT ABDOMEN AND PELVIS WITHOUT CONTRAST TECHNIQUE: Multidetector CT imaging of the abdomen and pelvis was performed following the standard protocol without IV contrast. COMPARISON:  CT 2 weeks prior 06/27/2016 FINDINGS: Lower chest: Lung bases are clear. Heart is prominent size. No pleural fluid. Hepatobiliary: Calcified gallstone without abnormal gallbladder distention. No evidence of pericholecystic inflammation. No focal hepatic lesion  allowing for noncontrast exam. Pancreas: Suboptimally assessed due to paucity of body fat and lack of intravenous contrast, no gross inflammation or ductal dilatation. Spleen:  Normal in size. Adrenals/Urinary Tract: No evidence of adrenal nodule. No hydronephrosis. Probable right lower pole renal scarring. Increased density of the renal medulla on the left suggesting nephrocalcinosis. No perinephric edema. Ureters are decompressed and not well evaluated. Urinary bladder is physiologically distended without stone. Stomach/Bowel: Gastric wall thickening about the cardia. More distal stomach is normal. Intraluminal low density in the second and third portion of the duodenum is likely secondary to ingested material versus focal wall abnormality. No evidence of bowel obstruction, enteric contrast reaches the ileal bowel loops. Appendix contains intraluminal high-density, may be from prior enteric contrast administration. No evidence of colonic wall thickening or inflammation. Vascular/Lymphatic: Aortic atherosclerosis without aneurysm. No bulky adenopathy within limitations of noncontrast exam and paucity of intra-abdominal fat. Reproductive: Right adnexal cyst measures 2.8 cm with peripheral calcification, unchanged from prior exam  allowing for differences in caliper placement. Uterus is quiescent. Left ovary not confidently identified. Other: Small fat containing umbilical hernia without bowel involvement. No evidence of inflammation. Minimal mesenteric edema and trace fluid adjacent to the inferior liver tip and in the pelvis. No free air. Musculoskeletal: There are no acute or suspicious osseous abnormalities. The bones are under mineralized. IMPRESSION: 1. Gastric wall thickening about the cardia, nonspecific may be infectious or inflammatory. Otherwise no acute abnormality. 2. Stable chronic findings of cholelithiasis, fat containing umbilical hernia, left renal nephrocalcinosis, aortic atherosclerosis and  peripherally calcified right adnexal cyst. Electronically Signed   By: Jeb Levering M.D.   On: 07/08/2016 18:59   Dg Chest 2 View  Result Date: 07/08/2016 CLINICAL DATA:  Cough EXAM: CHEST  2 VIEW COMPARISON:  None. FINDINGS: Heart is upper limits normal in size. Lungs are clear. No effusions. No acute bony abnormality. IMPRESSION: No active cardiopulmonary disease. Electronically Signed   By: Rolm Baptise M.D.   On: 07/08/2016 16:57     Medications:   . sodium chloride 50 mL/hr at 07/10/16 0354   . amLODipine  10 mg Oral Daily  . carvedilol  6.25 mg Oral BID WC  . heparin  5,000 Units Subcutaneous Q8H  . [START ON 07/13/2016] heparin subcutaneous  5,000 Units Subcutaneous Q8H  . hydroxychloroquine  300 mg Oral Daily  . mycophenolate  1,000 mg Oral BID  . pantoprazole  40 mg Oral Daily  . predniSONE  50 mg Oral Q breakfast     Assessment/ Plan:    Systemic Lupus with increase in serum creatinine and acute on chronic renal failure  Baseline creatinine 1.7 that has increased to above 3 in setting of hypertensive crisis. It appears that there is concern about lupus nephritis in the chart as contributing to her acute on chronic renal disease and a referral made to nephrology for a renal biopsy although this does not appear to have been done.   Hypertension  Appears to have no evidence of volume overload  Will recommend proceeding with renal biopsy to rule out lupus nephritis       LOS: 2 Kathleen [name] @TODAY @8 :57 AM

## 2016-07-10 NOTE — Progress Notes (Signed)
PROGRESS NOTE    Kathleen [name]  WUJ:[id number] DOB: [birth year] DOA: 07/08/2016 PCP: Pcp Not In System    Brief Narrative:  56 year old female with past medical history as below, which is significant for systemic lupus erythematous (followed loosely by Dr. Nydia Bouton at [facility] rheumatology), vasculitis, COPD, hypertension, seizures, suicide attempt in the past, presents with generalized weakness and abdominal discomfort. She was found to be in hypertensive emergency and in acute on CKD 3. She was admitted to ICU for hypertensive emergency and started on nicardipine infusion, . Her bp improved and she was transferred to [facility] service for further management. Nephrology consulted for evaluation of lupus nephritis.   Assessment & Plan:   Active Problems:   Hypertensive urgency   Hypertensive urgency: Improving.  Resume amlodipine and coreg.  Monitor.    Acute renal failure:  Renal biopsy scheduled for Monday by IR to rule out lupus nephritis.  NO evidence of volume overload.    GERD: resume protonix.   Constipation: resume stool softeners.     DVT prophylaxis: heparin.  Code Status: (Full) Family Communication: none at bedside.  Disposition Plan: pending further eval.    Consultants:   PCCM  Nephrology.   IR   Procedures: RENAL BIOPSY on 3/5   Antimicrobials: none.    Subjective: Constipated.  abd discomfort.   Objective: Vitals:   07/10/16 0241 07/10/16 0457 07/10/16 1000 07/10/16 1711  BP:  (!) 144/89 (!) 144/89 (!) 146/90  Pulse:  74 75 99  Resp:  18 18 18   Temp:  98.3 F (36.8 C) 98.7 F (37.1 C) 98 F (36.7 C)  TempSrc:   Oral Oral  SpO2:  100% 100% 100%  Weight: 50.5 kg (111 lb 5.3 oz)     Height:        Intake/Output Summary (Last 24 hours) at 07/10/16 1748 Last data filed at 07/10/16 1300  Gross per 24 hour  Intake             1340 ml  Output                0 ml  Net             1340 ml   Filed Weights   07/08/16  1405 07/08/16 2248 07/10/16 0241  Weight: 54.4 kg (120 lb) 50.5 kg (111 lb 5.3 oz) 50.5 kg (111 lb 5.3 oz)    Examination:  General exam: Appears calm and comfortable  Respiratory system: Clear to auscultation. Respiratory effort normal. Cardiovascular system: S1 & S2 heard, RRR. No JVD, murmurs, rubs, gallops or clicks. No pedal edema. Gastrointestinal system: Abdomen is nondistended, soft and nontender. No organomegaly or masses felt. Normal bowel sounds heard. Central nervous system: Alert and oriented. No focal neurological deficits. Extremities: Symmetric 5 x 5 power.     Data Reviewed: I have personally reviewed following labs and imaging studies  CBC:  Recent Labs Lab 07/08/16 1437 07/09/16 0726 07/10/16 0914  WBC 14.6* 11.9* 13.4*  NEUTROABS 12.3*  --   --   HGB 10.0* 8.9* 8.1*  HCT 31.9* 27.6* 24.8*  MCV 85.1 85.2 84.1  PLT 426* 333 325   Basic Metabolic Panel:  Recent Labs Lab 07/08/16 1437 07/09/16 0726 07/10/16 0914  NA 135 134* 133*  K 3.8 4.1 3.7  CL 102 101 103  CO2 25 22 21*  GLUCOSE 120* 69 132*  BUN 38* 32* 43*  CREATININE 3.12* 2.94* 3.39*  CALCIUM 8.9 8.6* 8.8*  MG  --  1.4*  --   PHOS  --  4.2  --    GFR: Estimated Creatinine Clearance: 14.9 mL/min (by C-G formula based on SCr of 3.39 mg/dL (H)). Liver Function Tests:  Recent Labs Lab 07/08/16 1437  AST 17  ALT 11*  ALKPHOS 62  BILITOT 0.7  PROT 6.8  ALBUMIN 3.2*   No results for input(s): LIPASE, AMYLASE in the last 168 hours. No results for input(s): AMMONIA in the last 168 hours. Coagulation Profile: No results for input(s): INR, PROTIME in the last 168 hours. Cardiac Enzymes:  Recent Labs Lab 07/08/16 2113 07/09/16 0215 07/09/16 0726  TROPONINI 0.21* 0.17* 0.14*   BNP (last 3 results) No results for input(s): PROBNP in the last 8760 hours. HbA1C: No results for input(s): HGBA1C in the last 72 hours. CBG:  Recent Labs Lab 07/08/16 1407 07/09/16 1309  GLUCAP  101* 133*   Lipid Profile: No results for input(s): CHOL, HDL, LDLCALC, TRIG, CHOLHDL, LDLDIRECT in the last 72 hours. Thyroid Function Tests: No results for input(s): TSH, T4TOTAL, FREET4, T3FREE, THYROIDAB in the last 72 hours. Anemia Panel: No results for input(s): VITAMINB12, FOLATE, FERRITIN, TIBC, IRON, RETICCTPCT in the last 72 hours. Sepsis Labs:  Recent Labs Lab 07/08/16 1501 07/08/16 1733 07/08/16 2251 07/09/16 0726  PROCALCITON  --   --  0.23 0.31  LATICACIDVEN 1.46 0.96  --   --     Recent Results (from the past 240 hour(s))  Blood Culture (routine x 2)     Status: None (Preliminary result)   Collection Time: 07/08/16  2:30 PM  Result Value Ref Range Status   Specimen Description BLOOD LEFT ANTECUBITAL  Final   Special Requests BOTTLES DRAWN AEROBIC AND ANAEROBIC 5CC  Final   Culture NO GROWTH 2 DAYS  Final   Report Status PENDING  Incomplete  Blood Culture (routine x 2)     Status: None (Preliminary result)   Collection Time: 07/08/16  2:49 PM  Result Value Ref Range Status   Specimen Description BLOOD RIGHT ANTECUBITAL  Final   Special Requests BOTTLES DRAWN AEROBIC AND ANAEROBIC 10CC  Final   Culture NO GROWTH 2 DAYS  Final   Report Status PENDING  Incomplete  MRSA PCR Screening     Status: None   Collection Time: 07/08/16 10:51 PM  Result Value Ref Range Status   MRSA by PCR NEGATIVE NEGATIVE Final    Comment:        The GeneXpert MRSA Assay (FDA approved for NASAL specimens only), is one component of a comprehensive MRSA colonization surveillance program. It is not intended to diagnose MRSA infection nor to guide or monitor treatment for MRSA infections.          Radiology Studies: Ct Abdomen Pelvis Wo Contrast  Result Date: 07/08/2016 CLINICAL DATA:  Abdominal pain.  Acute kidney injury. EXAM: CT ABDOMEN AND PELVIS WITHOUT CONTRAST TECHNIQUE: Multidetector CT imaging of the abdomen and pelvis was performed following the standard protocol  without IV contrast. COMPARISON:  CT 2 weeks prior 06/27/2016 FINDINGS: Lower chest: Lung bases are clear. Heart is prominent size. No pleural fluid. Hepatobiliary: Calcified gallstone without abnormal gallbladder distention. No evidence of pericholecystic inflammation. No focal hepatic lesion allowing for noncontrast exam. Pancreas: Suboptimally assessed due to paucity of body fat and lack of intravenous contrast, no gross inflammation or ductal dilatation. Spleen:  Normal in size. Adrenals/Urinary Tract: No evidence of adrenal nodule. No hydronephrosis. Probable right lower pole renal scarring. Increased density  of the renal medulla on the left suggesting nephrocalcinosis. No perinephric edema. Ureters are decompressed and not well evaluated. Urinary bladder is physiologically distended without stone. Stomach/Bowel: Gastric wall thickening about the cardia. More distal stomach is normal. Intraluminal low density in the second and third portion of the duodenum is likely secondary to ingested material versus focal wall abnormality. No evidence of bowel obstruction, enteric contrast reaches the ileal bowel loops. Appendix contains intraluminal high-density, may be from prior enteric contrast administration. No evidence of colonic wall thickening or inflammation. Vascular/Lymphatic: Aortic atherosclerosis without aneurysm. No bulky adenopathy within limitations of noncontrast exam and paucity of intra-abdominal fat. Reproductive: Right adnexal cyst measures 2.8 cm with peripheral calcification, unchanged from prior exam allowing for differences in caliper placement. Uterus is quiescent. Left ovary not confidently identified. Other: Small fat containing umbilical hernia without bowel involvement. No evidence of inflammation. Minimal mesenteric edema and trace fluid adjacent to the inferior liver tip and in the pelvis. No free air. Musculoskeletal: There are no acute or suspicious osseous abnormalities. The bones are  under mineralized. IMPRESSION: 1. Gastric wall thickening about the cardia, nonspecific may be infectious or inflammatory. Otherwise no acute abnormality. 2. Stable chronic findings of cholelithiasis, fat containing umbilical hernia, left renal nephrocalcinosis, aortic atherosclerosis and peripherally calcified right adnexal cyst. Electronically Signed   By: Rubye Oaks M.D.   On: 07/08/2016 18:59        Scheduled Meds: . amLODipine  10 mg Oral Daily  . carvedilol  6.25 mg Oral BID WC  . heparin  5,000 Units Subcutaneous Q8H  . [START ON 07/13/2016] heparin subcutaneous  5,000 Units Subcutaneous Q8H  . hydroxychloroquine  300 mg Oral Daily  . mycophenolate  1,000 mg Oral BID  . pantoprazole  40 mg Oral Daily  . predniSONE  50 mg Oral Q breakfast  . senna-docusate  2 tablet Oral BID   Continuous Infusions: . sodium chloride 50 mL/hr at 07/10/16 0354     LOS: 2 days    Time spent: 38     Gabriellah Rabel, MD [facility] Pager [phone]  If 7PM-7AM, please contact night-coverage [email or web address] Password [email or web address] 07/10/2016, 5:48 PM

## 2016-07-11 MED ORDER — LABETALOL HCL 200 MG PO TABS
200.0000 mg | ORAL_TABLET | Freq: Two times a day (BID) | ORAL | Status: DC
  Administered 2016-07-11 – 2016-07-16 (×11): 200 mg via ORAL
  Filled 2016-07-11 (×11): qty 1

## 2016-07-11 NOTE — Progress Notes (Signed)
PROGRESS NOTE    Kathleen [name]  ONG:[id number] DOB: 11/27/1960 DOA: 07/08/2016 PCP: Pcp Not In System    Brief Narrative:  56 year old female with past medical history as below, which is significant for systemic lupus erythematous (followed loosely by Dr. Nydia Bouton at [facility] rheumatology), vasculitis, COPD, hypertension, seizures, suicide attempt in the past, presents with generalized weakness and abdominal discomfort. She was found to be in hypertensive emergency and in acute on CKD 3. She was admitted to ICU for hypertensive emergency and started on nicardipine infusion, . Her bp improved and she was transferred to [facility] service for further management. Nephrology consulted for evaluation of lupus nephritis.   Assessment & Plan:   Active Problems:   Hypertensive urgency   Hypertensive urgency: bp high this am, coreg replaced by labetalol.  Resume amlodipine. Monitor.    Acute renal failure:  Renal biopsy scheduled for Monday by IR to rule out lupus nephritis.  NO evidence of volume overload.    GERD: resume protonix.   Constipation: resume stool softeners.     DVT prophylaxis: heparin.  Code Status: (Full) Family Communication: none at bedside.  Disposition Plan: pending further eval.    Consultants:   PCCM  Nephrology.   IR   Procedures: RENAL BIOPSY on 3/5   Antimicrobials: none.    Subjective: No new complaints.   Objective: Vitals:   07/10/16 1711 07/10/16 2055 07/11/16 0534 07/11/16 0914  BP: (!) 146/90 (!) 149/89 (!) 169/81 (!) 181/96  Pulse: 99 65 (!) 56 (!) 54  Resp: 18 19 20 18   Temp: 98 F (36.7 C) 98.3 F (36.8 C) 97.8 F (36.6 C) 98 F (36.7 C)  TempSrc: Oral Oral Oral Oral  SpO2: 100% 100% 100% 100%  Weight:  49.9 kg (110 lb 0.2 oz)    Height:        Intake/Output Summary (Last 24 hours) at 07/11/16 1552 Last data filed at 07/11/16 0900  Gross per 24 hour  Intake          2103.33 ml  Output                0  ml  Net          2103.33 ml   Filed Weights   07/08/16 2248 07/10/16 0241 07/10/16 2055  Weight: 50.5 kg (111 lb 5.3 oz) 50.5 kg (111 lb 5.3 oz) 49.9 kg (110 lb 0.2 oz)    Examination:  General exam: Appears calm and comfortable  Respiratory system: Clear to auscultation. Respiratory effort normal. Cardiovascular system: S1 & S2 heard, RRR. No JVD, murmurs, rubs, gallops or clicks. No pedal edema. Gastrointestinal system: Abdomen is nondistended, soft and nontender. No organomegaly or masses felt. Normal bowel sounds heard. Central nervous system: Alert and oriented. No focal neurological deficits. Extremities: Symmetric 5 x 5 power.     Data Reviewed: I have personally reviewed following labs and imaging studies  CBC:  Recent Labs Lab 07/08/16 1437 07/09/16 0726 07/10/16 0914  WBC 14.6* 11.9* 13.4*  NEUTROABS 12.3*  --   --   HGB 10.0* 8.9* 8.1*  HCT 31.9* 27.6* 24.8*  MCV 85.1 85.2 84.1  PLT 426* 333 325   Basic Metabolic Panel:  Recent Labs Lab 07/08/16 1437 07/09/16 0726 07/10/16 0914  NA 135 134* 133*  K 3.8 4.1 3.7  CL 102 101 103  CO2 25 22 21*  GLUCOSE 120* 69 132*  BUN 38* 32* 43*  CREATININE 3.12* 2.94* 3.39*  CALCIUM 8.9 8.6* 8.8*  MG  --  1.4*  --   PHOS  --  4.2  --    GFR: Estimated Creatinine Clearance: 14.8 mL/min (by C-G formula based on SCr of 3.39 mg/dL (H)). Liver Function Tests:  Recent Labs Lab 07/08/16 1437  AST 17  ALT 11*  ALKPHOS 62  BILITOT 0.7  PROT 6.8  ALBUMIN 3.2*   No results for input(s): LIPASE, AMYLASE in the last 168 hours. No results for input(s): AMMONIA in the last 168 hours. Coagulation Profile: No results for input(s): INR, PROTIME in the last 168 hours. Cardiac Enzymes:  Recent Labs Lab 07/08/16 2113 07/09/16 0215 07/09/16 0726  TROPONINI 0.21* 0.17* 0.14*   BNP (last 3 results) No results for input(s): PROBNP in the last 8760 hours. HbA1C: No results for input(s): HGBA1C in the last 72  hours. CBG:  Recent Labs Lab 07/08/16 1407 07/09/16 1309  GLUCAP 101* 133*   Lipid Profile: No results for input(s): CHOL, HDL, LDLCALC, TRIG, CHOLHDL, LDLDIRECT in the last 72 hours. Thyroid Function Tests: No results for input(s): TSH, T4TOTAL, FREET4, T3FREE, THYROIDAB in the last 72 hours. Anemia Panel: No results for input(s): VITAMINB12, FOLATE, FERRITIN, TIBC, IRON, RETICCTPCT in the last 72 hours. Sepsis Labs:  Recent Labs Lab 07/08/16 1501 07/08/16 1733 07/08/16 2251 07/09/16 0726  PROCALCITON  --   --  0.23 0.31  LATICACIDVEN 1.46 0.96  --   --     Recent Results (from the past 240 hour(s))  Blood Culture (routine x 2)     Status: None (Preliminary result)   Collection Time: 07/08/16  2:30 PM  Result Value Ref Range Status   Specimen Description BLOOD LEFT ANTECUBITAL  Final   Special Requests BOTTLES DRAWN AEROBIC AND ANAEROBIC 5CC  Final   Culture NO GROWTH 3 DAYS  Final   Report Status PENDING  Incomplete  Blood Culture (routine x 2)     Status: None (Preliminary result)   Collection Time: 07/08/16  2:49 PM  Result Value Ref Range Status   Specimen Description BLOOD RIGHT ANTECUBITAL  Final   Special Requests BOTTLES DRAWN AEROBIC AND ANAEROBIC 10CC  Final   Culture NO GROWTH 3 DAYS  Final   Report Status PENDING  Incomplete  MRSA PCR Screening     Status: None   Collection Time: 07/08/16 10:51 PM  Result Value Ref Range Status   MRSA by PCR NEGATIVE NEGATIVE Final    Comment:        The GeneXpert MRSA Assay (FDA approved for NASAL specimens only), is one component of a comprehensive MRSA colonization surveillance program. It is not intended to diagnose MRSA infection nor to guide or monitor treatment for MRSA infections.          Radiology Studies: No results found.      Scheduled Meds: . amLODipine  10 mg Oral Daily  . heparin  5,000 Units Subcutaneous Q8H  . [START ON 07/13/2016] heparin subcutaneous  5,000 Units Subcutaneous Q8H    . hydroxychloroquine  300 mg Oral Daily  . labetalol  200 mg Oral BID  . mycophenolate  1,000 mg Oral BID  . pantoprazole  40 mg Oral Daily  . predniSONE  50 mg Oral Q breakfast  . senna-docusate  2 tablet Oral BID   Continuous Infusions: . sodium chloride 50 mL/hr at 07/10/16 2348     LOS: 3 days    Time spent: 30     Desha Bitner, MD [facility] Pager  [phone]  If 7PM-7AM, please contact night-coverage [email or web address] Password [email or web address] 07/11/2016, 3:52 PM

## 2016-07-11 NOTE — Progress Notes (Signed)
[facility] ROUNDING NOTE   Subjective:   Interval History: Interval History:  :56 year old female with past medical history as below, which is significant for systemic lupus erythematous (followed loosely by Dr. Annabelle Harman at [facility] rheumatology), vasculitis, COPD, hypertension, seizures, and suicide attempt. She was recently admitted to [facility] with presenting diagnosis of weakness. She was found be symptomatically anemic requiring blood transfusion. It was also found that she had not been taking her Plaquenil, CellCept, or prednisone and these were restarted. Her serum creatinine was also elevated and this was thought to be secondary to lupus nephritis, although this had not been confirmed. Since discharge she had been doing okay until about 2/26 when she started having weakness again. 3/1 She called her nephrology office who reviewed the labs for her admission and is growing increasingly worried about lupus nephritis. He increased her dose of prednisone and asked that she present to the nearest emergency department.  3/1 she presented [facility] emergency department with chief complaint abdominal pain and weakness. Abdominal pain complaint is consistent with umbilical hernia that she has had for some time but has just recently become painful in recent months. She was also found to be profoundly hypertensive with systolic blood pressure 062. She was started on nicardipine infusion after not responding to by mouth amlodipine. She underwent CT of her abdomen and pelvis which again demonstrated umbilical hernia and no other cause for acute abdominal pain. She is unable to be liberated from the carpet infusion, therefore PCCM has been asked to admit to ICU.  Patient subsequently transferred to floor with good control of blood pressure and creatinine that has run in the 2.5 - 3 range  She has an inactive urine sediment with only small amount of proteinuria. She may  not have active lupus nephritis although complements were low in [facility] and they had concerns about lupus nephritis. Will proceed with renal biopsy Monday   Blood pressure control could be optimized, will change to labetalol 200mg  BID   Objective:  Vital signs in last 24 hours:  Temp:  [97.8 F (36.6 C)-98.7 F (37.1 C)] 98 F (36.7 C) (03/04 0914) Pulse Rate:  [54-99] 54 (03/04 0914) Resp:  [18-20] 18 (03/04 0914) BP: (144-181)/(81-96) 181/96 (03/04 0914) SpO2:  [100 %] 100 % (03/04 0914) Weight:  [110 lb 0.2 oz (49.9 kg)] 110 lb 0.2 oz (49.9 kg) (03/03 2055)  Weight change: -1 lb 5.2 oz (-0.6 kg) Filed Weights   07/08/16 2248 07/10/16 0241 07/10/16 2055  Weight: 111 lb 5.3 oz (50.5 kg) 111 lb 5.3 oz (50.5 kg) 110 lb 0.2 oz (49.9 kg)    Intake/Output: I/O last 3 completed shifts: In: 3033.3 [P.O.:1200; I.V.:1833.3] Out: 0    Intake/Output this shift:  Total I/O In: 360 [P.O.:360] Out: 0   CVS- RRR RS- CTA ABD- BS present soft non-distended EXT- no edema   Basic Metabolic Panel:  Recent Labs Lab 07/08/16 1437 07/09/16 0726 07/10/16 0914  NA 135 134* 133*  K 3.8 4.1 3.7  CL 102 101 103  CO2 25 22 21*  GLUCOSE 120* 69 132*  BUN 38* 32* 43*  CREATININE 3.12* 2.94* 3.39*  CALCIUM 8.9 8.6* 8.8*  MG  --  1.4*  --   PHOS  --  4.2  --     Liver Function Tests:  Recent Labs Lab 07/08/16 1437  AST 17  ALT 11*  ALKPHOS 62  BILITOT 0.7  PROT 6.8  ALBUMIN 3.2*  No results for input(s): LIPASE, AMYLASE in the last 168 hours. No results for input(s): AMMONIA in the last 168 hours.  CBC:  Recent Labs Lab 07/08/16 1437 07/09/16 0726 07/10/16 0914  WBC 14.6* 11.9* 13.4*  NEUTROABS 12.3*  --   --   HGB 10.0* 8.9* 8.1*  HCT 31.9* 27.6* 24.8*  MCV 85.1 85.2 84.1  PLT 426* 333 325    Cardiac Enzymes:  Recent Labs Lab 07/08/16 2113 07/09/16 0215 07/09/16 0726  TROPONINI 0.21* 0.17* 0.14*    BNP: Invalid input(s):  POCBNP  CBG:  Recent Labs Lab 07/08/16 1407 07/09/16 1309  GLUCAP 101* 133*    Microbiology: Results for orders placed or performed during the hospital encounter of 07/08/16  Blood Culture (routine x 2)     Status: None (Preliminary result)   Collection Time: 07/08/16  2:30 PM  Result Value Ref Range Status   Specimen Description BLOOD LEFT ANTECUBITAL  Final   Special Requests BOTTLES DRAWN AEROBIC AND ANAEROBIC 5CC  Final   Culture NO GROWTH 2 DAYS  Final   Report Status PENDING  Incomplete  Blood Culture (routine x 2)     Status: None (Preliminary result)   Collection Time: 07/08/16  2:49 PM  Result Value Ref Range Status   Specimen Description BLOOD RIGHT ANTECUBITAL  Final   Special Requests BOTTLES DRAWN AEROBIC AND ANAEROBIC 10CC  Final   Culture NO GROWTH 2 DAYS  Final   Report Status PENDING  Incomplete  MRSA PCR Screening     Status: None   Collection Time: 07/08/16 10:51 PM  Result Value Ref Range Status   MRSA by PCR NEGATIVE NEGATIVE Final    Comment:        The GeneXpert MRSA Assay (FDA approved for NASAL specimens only), is one component of a comprehensive MRSA colonization surveillance program. It is not intended to diagnose MRSA infection nor to guide or monitor treatment for MRSA infections.     Coagulation Studies: No results for input(s): LABPROT, INR in the last 72 hours.  Urinalysis:  Recent Labs  07/08/16 1715  COLORURINE COLORLESS*  LABSPEC 1.005  PHURINE 7.0  GLUCOSEU NEGATIVE  HGBUR SMALL*  BILIRUBINUR NEGATIVE  KETONESUR NEGATIVE  PROTEINUR 100*  NITRITE NEGATIVE  LEUKOCYTESUR NEGATIVE      Imaging: No results found.   Medications:   . sodium chloride 50 mL/hr at 07/10/16 2348   . amLODipine  10 mg Oral Daily  . carvedilol  6.25 mg Oral BID WC  . heparin  5,000 Units Subcutaneous Q8H  . [START ON 07/13/2016] heparin subcutaneous  5,000 Units Subcutaneous Q8H  . hydroxychloroquine  300 mg Oral Daily  . mycophenolate   1,000 mg Oral BID  . pantoprazole  40 mg Oral Daily  . predniSONE  50 mg Oral Q breakfast  . senna-docusate  2 tablet Oral BID     Assessment/ Plan:   Systemic Lupus with increase in serum creatinine and acute on chronic renal failure Baseline creatinine 1.7 that has increased to above 3 in setting of hypertensive crisis. It appears that there is concern about lupus nephritis in the chart as contributing to her acute on chronic renal disease and a referral made to nephrology for a renal biopsy although this does not appear to have been done.   Hypertension Appears to have no evidence of volume overload. Will change coreg to labetalol 200mg  BID     Will recommend proceeding with renal biopsy to rule out lupus nephritis  LOS: 3 Toba Claudio W @TODAY @9 :36 AM

## 2016-07-12 ENCOUNTER — Inpatient Hospital Stay (HOSPITAL_COMMUNITY): Payer: Medicare Other

## 2016-07-12 LAB — CBC
HCT: 23.6 % — ABNORMAL LOW (ref 36.0–46.0)
Hemoglobin: 7.7 g/dL — ABNORMAL LOW (ref 12.0–15.0)
MCH: 27.5 pg (ref 26.0–34.0)
MCHC: 32.6 g/dL (ref 30.0–36.0)
MCV: 84.3 fL (ref 78.0–100.0)
Platelets: 301 10*3/uL (ref 150–400)
RBC: 2.8 MIL/uL — ABNORMAL LOW (ref 3.87–5.11)
RDW: 16 % — AB (ref 11.5–15.5)
WBC: 8.6 10*3/uL (ref 4.0–10.5)

## 2016-07-12 LAB — PROTIME-INR
INR: 1.06
Prothrombin Time: 13.8 seconds (ref 11.4–15.2)

## 2016-07-12 MED ORDER — FENTANYL CITRATE (PF) 100 MCG/2ML IJ SOLN
INTRAMUSCULAR | Status: AC | PRN
  Administered 2016-07-12 (×2): 25 ug via INTRAVENOUS

## 2016-07-12 MED ORDER — HYDRALAZINE HCL 25 MG PO TABS
25.0000 mg | ORAL_TABLET | Freq: Three times a day (TID) | ORAL | Status: DC
  Administered 2016-07-12 – 2016-07-16 (×14): 25 mg via ORAL
  Filled 2016-07-12 (×14): qty 1

## 2016-07-12 MED ORDER — LIDOCAINE HCL 1 % IJ SOLN
INTRAMUSCULAR | Status: AC
  Filled 2016-07-12: qty 20

## 2016-07-12 MED ORDER — FENTANYL CITRATE (PF) 100 MCG/2ML IJ SOLN
INTRAMUSCULAR | Status: AC
  Filled 2016-07-12: qty 2

## 2016-07-12 MED ORDER — MIDAZOLAM HCL 2 MG/2ML IJ SOLN
INTRAMUSCULAR | Status: AC | PRN
  Administered 2016-07-12 (×2): 0.5 mg via INTRAVENOUS
  Administered 2016-07-12: 1 mg via INTRAVENOUS

## 2016-07-12 MED ORDER — GELATIN ABSORBABLE 12-7 MM EX MISC
CUTANEOUS | Status: AC
Start: 2016-07-12 — End: 2016-07-12
  Filled 2016-07-12: qty 1

## 2016-07-12 MED ORDER — OXYCODONE HCL 5 MG PO TABS
5.0000 mg | ORAL_TABLET | ORAL | Status: DC | PRN
  Administered 2016-07-12 – 2016-07-16 (×6): 5 mg via ORAL
  Filled 2016-07-12 (×6): qty 1

## 2016-07-12 MED ORDER — MIDAZOLAM HCL 2 MG/2ML IJ SOLN
INTRAMUSCULAR | Status: AC
  Filled 2016-07-12: qty 2

## 2016-07-12 NOTE — Progress Notes (Signed)
PROGRESS NOTE    Kathleen [name]  BJY:[id number] DOB: 30-May-1960 DOA: 07/08/2016 PCP: Pcp Not In System    Brief Narrative:  56 year old female with past medical history as below, which is significant for systemic lupus erythematous (followed loosely by Dr. Nydia Bouton at [facility] rheumatology), vasculitis, COPD, hypertension, seizures, suicide attempt in the past, presents with generalized weakness and abdominal discomfort. She was found to be in hypertensive emergency and in acute on CKD 3. She was admitted to ICU for hypertensive emergency and started on nicardipine infusion, . Her bp improved and she was transferred to [facility] service for further management. Nephrology consulted for evaluation of lupus nephritis.   Assessment & Plan:   Active Problems:   Hypertensive urgency   Hypertensive urgency: bp high this am, coreg replaced by labetalol.  Resume amlodipine. Added hydralazine. bp well controlled this evening after staring hydralazine. Monitor.    Acute renal failure:  Renal biopsy done Monday by IR to rule out lupus nephritis.  NO evidence of volume overload.  Resume cell cept Further management as per renal.  Continue to monitor creatinine.    GERD: resume protonix.   Constipation: resume stool softeners.     DVT prophylaxis: heparin.  Code Status: (Full) Family Communication: none at bedside.  Disposition Plan: pending further eval.    Consultants:   PCCM  Nephrology.   IR   Procedures: RENAL BIOPSY on 3/5   Antimicrobials: none.    Subjective: No new complaints.   Objective: Vitals:   07/12/16 1100 07/12/16 1105 07/12/16 1232 07/12/16 1639  BP: (!) 142/87 (!) 133/94 126/75 111/75  Pulse: 63 66 64 75  Resp: 13 16 16 14   Temp:   98.6 F (37 C) 99 F (37.2 C)  TempSrc:   Oral Oral  SpO2: 100% 100% 100% 100%  Weight:      Height:        Intake/Output Summary (Last 24 hours) at 07/12/16 1815 Last data filed at 07/12/16  1300  Gross per 24 hour  Intake          1369.17 ml  Output              400 ml  Net           969.17 ml   Filed Weights   07/10/16 0241 07/10/16 2055 07/11/16 2113  Weight: 50.5 kg (111 lb 5.3 oz) 49.9 kg (110 lb 0.2 oz) 50.2 kg (110 lb 10.7 oz)    Examination:  General exam: Appears calm and comfortable  Respiratory system: Clear to auscultation. Respiratory effort normal. Cardiovascular system: S1 & S2 heard, RRR. No JVD, murmurs, rubs, gallops or clicks. No pedal edema. Gastrointestinal system: Abdomen is nondistended, soft and nontender. No organomegaly or masses felt. Normal bowel sounds heard. Central nervous system: Alert and oriented. No focal neurological deficits. Extremities: Symmetric 5 x 5 power.     Data Reviewed: I have personally reviewed following labs and imaging studies  CBC:  Recent Labs Lab 07/08/16 1437 07/09/16 0726 07/10/16 0914 07/12/16 0522  WBC 14.6* 11.9* 13.4* 8.6  NEUTROABS 12.3*  --   --   --   HGB 10.0* 8.9* 8.1* 7.7*  HCT 31.9* 27.6* 24.8* 23.6*  MCV 85.1 85.2 84.1 84.3  PLT 426* 333 325 301   Basic Metabolic Panel:  Recent Labs Lab 07/08/16 1437 07/09/16 0726 07/10/16 0914  NA 135 134* 133*  K 3.8 4.1 3.7  CL 102 101 103  CO2 25 22  21*  GLUCOSE 120* 69 132*  BUN 38* 32* 43*  CREATININE 3.12* 2.94* 3.39*  CALCIUM 8.9 8.6* 8.8*  MG  --  1.4*  --   PHOS  --  4.2  --    GFR: Estimated Creatinine Clearance: 14.9 mL/min (by C-G formula based on SCr of 3.39 mg/dL (H)). Liver Function Tests:  Recent Labs Lab 07/08/16 1437  AST 17  ALT 11*  ALKPHOS 62  BILITOT 0.7  PROT 6.8  ALBUMIN 3.2*   No results for input(s): LIPASE, AMYLASE in the last 168 hours. No results for input(s): AMMONIA in the last 168 hours. Coagulation Profile:  Recent Labs Lab 2016/07/17 0522  INR 1.06   Cardiac Enzymes:  Recent Labs Lab 07/08/16 2113 07/09/16 0215 07/09/16 0726  TROPONINI 0.21* 0.17* 0.14*   BNP (last 3 results) No  results for input(s): PROBNP in the last 8760 hours. HbA1C: No results for input(s): HGBA1C in the last 72 hours. CBG:  Recent Labs Lab 07/08/16 1407 07/09/16 1309  GLUCAP 101* 133*   Lipid Profile: No results for input(s): CHOL, HDL, LDLCALC, TRIG, CHOLHDL, LDLDIRECT in the last 72 hours. Thyroid Function Tests: No results for input(s): TSH, T4TOTAL, FREET4, T3FREE, THYROIDAB in the last 72 hours. Anemia Panel: No results for input(s): VITAMINB12, FOLATE, FERRITIN, TIBC, IRON, RETICCTPCT in the last 72 hours. Sepsis Labs:  Recent Labs Lab 07/08/16 1501 07/08/16 1733 07/08/16 2251 07/09/16 0726  PROCALCITON  --   --  0.23 0.31  LATICACIDVEN 1.46 0.96  --   --     Recent Results (from the past 240 hour(s))  Blood Culture (routine x 2)     Status: None (Preliminary result)   Collection Time: 07/08/16  2:30 PM  Result Value Ref Range Status   Specimen Description BLOOD LEFT ANTECUBITAL  Final   Special Requests BOTTLES DRAWN AEROBIC AND ANAEROBIC 5CC  Final   Culture NO GROWTH 4 DAYS  Final   Report Status PENDING  Incomplete  Blood Culture (routine x 2)     Status: None (Preliminary result)   Collection Time: 07/08/16  2:49 PM  Result Value Ref Range Status   Specimen Description BLOOD RIGHT ANTECUBITAL  Final   Special Requests BOTTLES DRAWN AEROBIC AND ANAEROBIC 10CC  Final   Culture NO GROWTH 4 DAYS  Final   Report Status PENDING  Incomplete  MRSA PCR Screening     Status: None   Collection Time: 07/08/16 10:51 PM  Result Value Ref Range Status   MRSA by PCR NEGATIVE NEGATIVE Final    Comment:        The GeneXpert MRSA Assay (FDA approved for NASAL specimens only), is one component of a comprehensive MRSA colonization surveillance program. It is not intended to diagnose MRSA infection nor to guide or monitor treatment for MRSA infections.          Radiology Studies: US Biopsy  Result Date: 07/17/16 INDICATION: 56 year old female with renal  disease, referred for biopsy EXAM: ULTRASOUND BIOPSY CORE LIVER MEDICATIONS: None. ANESTHESIA/SEDATION: Moderate (conscious) sedation was employed during this procedure. A total of Versed 2.0 mg and Fentanyl 50 mcg was administered intravenously. Moderate Sedation Time: 15 minutes. The patient's level of consciousness and vital signs were monitored continuously by radiology nursing throughout the procedure under my direct supervision. FLUOROSCOPY TIME:  None COMPLICATIONS: None PROCEDURE: Informed written consent was obtained from the patient after a thorough discussion of the procedural risks, benefits and alternatives. All questions were addressed. Maximal Sterile Barrier Technique  was utilized including caps, mask, sterile gowns, sterile gloves, sterile drape, hand hygiene and skin antiseptic. A timeout was performed prior to the initiation of the procedure. Patient was positioned prone position on the gantry table. Images were stored sent to PACs. Once the patient is prepped and draped in the usual sterile fashion, the skin and subcutaneous tissues overlying the left kidney were generously infiltrated 1% lidocaine for local anesthesia. Using ultrasound guidance, a 15 gauge guide needle was advanced into the lower cortex of the left kidney. Once we confirmed location of the needle tip, 2 separate 16 gauge core biopsy were achieved. Two Gel-Foam pledgets were infused with a small amount of saline. The needle was removed. Final images were stored. The patient tolerated the procedure well and remained hemodynamically stable throughout. No complications were encountered and no significant blood loss encountered. IMPRESSION: Status post ultrasound-guided left sided medical renal biopsy. Signed, Yvone Neu. Loreta Ave, DO [facility] Electronically Signed   By: Gilmer Mor D.O.   On: 07/12/2016 14:04        Scheduled Meds: . amLODipine  10 mg Oral Daily  .  fentaNYL      . gelatin adsorbable      . [START ON 07/13/2016] heparin subcutaneous  5,000 Units Subcutaneous Q8H  . hydrALAZINE  25 mg Oral Q8H  . hydroxychloroquine  300 mg Oral Daily  . labetalol  200 mg Oral BID  . lidocaine      . midazolam      . mycophenolate  1,000 mg Oral BID  . pantoprazole  40 mg Oral Daily  . predniSONE  50 mg Oral Q breakfast  . senna-docusate  2 tablet Oral BID   Continuous Infusions: . sodium chloride 50 mL/hr at 07/10/16 2348     LOS: 4 days    Time spent: 30     Michaele Amundson, MD [facility] Pager [phone]  If 7PM-7AM, please contact night-coverage [email or web address] Password [facility] 07/12/2016, 6:15 PM

## 2016-07-12 NOTE — Procedures (Signed)
Interventional Radiology Procedure Note  Procedure: US guided left medical renal biopsy.  3x 16g core.  Complications: None Recommendations:  - Ok to shower tomorrow - Do not submerge for 7 days - Routine care - 2 hour recovery supine position before ambulation - follow up pathology   Signed,  Dulcy Fanny. Earleen [place], DO

## 2016-07-12 NOTE — Progress Notes (Signed)
S: SP renal bx, some pain at site.  No jt pain O:BP 126/75 (BP Location: Right Arm)   Pulse 64   Temp 98.6 F (37 C) (Oral)   Resp 16   Ht 5\' 4"  (1.626 m)   Wt 50.2 kg (110 lb 10.7 oz)   SpO2 100%   BMI 19.00 kg/m   Intake/Output Summary (Last 24 hours) at 07/12/16 1304 Last data filed at 07/12/16 0629  Gross per 24 hour  Intake          1369.17 ml  Output              400 ml  Net           969.17 ml   Weight change: 0.3 kg (10.6 oz) ZOX:WRUEA and alert CVS: RRR Resp:clear Abd:+ BS NTND Ext: no edema NEURO:CNI Ox3 no asterixis   . amLODipine  10 mg Oral Daily  . fentaNYL      . gelatin adsorbable      . [START ON 07/13/2016] heparin subcutaneous  5,000 Units Subcutaneous Q8H  . hydrALAZINE  25 mg Oral Q8H  . hydroxychloroquine  300 mg Oral Daily  . labetalol  200 mg Oral BID  . lidocaine      . midazolam      . mycophenolate  1,000 mg Oral BID  . pantoprazole  40 mg Oral Daily  . predniSONE  50 mg Oral Q breakfast  . senna-docusate  2 tablet Oral BID   No results found. BMET    Component Value Date/Time   NA 133 (L) 07/10/2016 0914   K 3.7 07/10/2016 0914   CL 103 07/10/2016 0914   CO2 21 (L) 07/10/2016 0914   GLUCOSE 132 (H) 07/10/2016 0914   BUN 43 (H) 07/10/2016 0914   CREATININE 3.39 (H) 07/10/2016 0914   CALCIUM 8.8 (L) 07/10/2016 0914   GFRNONAA 14 (L) 07/10/2016 0914   GFRAA 16 (L) 07/10/2016 0914   CBC    Component Value Date/Time   WBC 8.6 07/12/2016 0522   RBC 2.80 (L) 07/12/2016 0522   HGB 7.7 (L) 07/12/2016 0522   HCT 23.6 (L) 07/12/2016 0522   PLT 301 07/12/2016 0522   MCV 84.3 07/12/2016 0522   MCH 27.5 07/12/2016 0522   MCHC 32.6 07/12/2016 0522   RDW 16.0 (H) 07/12/2016 0522   LYMPHSABS 1.1 07/08/2016 1437   MONOABS 1.2 (H) 07/08/2016 1437   EOSABS 0.1 07/08/2016 1437   BASOSABS 0.0 07/08/2016 1437     Assessment: 1. ? Lupus nephritis.  SP renal bx today.  Urine not active though some protein 2. Anemia 3. SLE 4. HTN  urgency, BP under better control  Plan: 1. Will see what bx shows but would not change tx at this time as she has not been taking any meds for almost a year and thus I would not consider any renal lesion to be a failure of cellcept.  She says she has seen a nephrologist at [facility] but can't tell me the name?? 2. Cont with pred and cellcept 3. She was on felodipine as outpt (or suppose to be) and thus would switch to that at DC as she has a bottle in her purse. 4. Daily Scr  Lilla Callejo T

## 2016-07-13 LAB — RENAL FUNCTION PANEL
ALBUMIN: 2.9 g/dL — AB (ref 3.5–5.0)
Anion gap: 9 (ref 5–15)
BUN: 31 mg/dL — ABNORMAL HIGH (ref 6–20)
CALCIUM: 9 mg/dL (ref 8.9–10.3)
CO2: 19 mmol/L — ABNORMAL LOW (ref 22–32)
Chloride: 108 mmol/L (ref 101–111)
Creatinine, Ser: 3.1 mg/dL — ABNORMAL HIGH (ref 0.44–1.00)
GFR calc Af Amer: 18 mL/min — ABNORMAL LOW (ref >60)
GFR calc non Af Amer: 16 mL/min — ABNORMAL LOW (ref >60)
GLUCOSE: 85 mg/dL (ref 65–99)
PHOSPHORUS: 4.1 mg/dL (ref 2.5–4.6)
Potassium: 4.4 mmol/L (ref 3.5–5.1)
SODIUM: 136 mmol/L (ref 135–145)

## 2016-07-13 LAB — CULTURE, BLOOD (ROUTINE X 2)
Culture: NO GROWTH
Culture: NO GROWTH

## 2016-07-13 MED ORDER — ONDANSETRON HCL 4 MG/2ML IJ SOLN
4.0000 mg | Freq: Four times a day (QID) | INTRAMUSCULAR | Status: DC | PRN
  Administered 2016-07-13: 4 mg via INTRAVENOUS
  Filled 2016-07-13: qty 2

## 2016-07-13 NOTE — Progress Notes (Signed)
PROGRESS NOTE    Kathleen [name]  NWG:[id number] DOB: 1960/12/31 DOA: 07/08/2016 PCP: Pcp Not In System    Brief Narrative:  56 year old female with past medical history as below, which is significant for systemic lupus erythematous (followed loosely by Dr. Annabelle Harman at [facility] rheumatology), vasculitis, COPD, hypertension, seizures, suicide attempt in the past, presents with generalized weakness and abdominal discomfort. She was found to be in hypertensive emergency and in acute on CKD 3. She was admitted to ICU for hypertensive emergency and started on nicardipine infusion, . Her bp improved and she was transferred to [facility] service for further management. Nephrology consulted for evaluation of lupus nephritis.   Assessment & Plan:   Active Problems:   Hypertensive urgency   Hypertensive urgency: Much better this am.  Continue with same regimen on discharge.    Acute renal failure:  Renal biopsy done Monday by IR to rule out lupus nephritis. Follow up the results of the biopsy.  NO evidence of volume overload.  Resume cell cept and prednisone. Further management as per renal.  Continue to monitor creatinine. Improving.   GERD: resume protonix.   Constipation: resume stool softeners.   Lupus: resume hydroxychloroquine 300 mg daily. And prednisone.      DVT prophylaxis: heparin.  Code Status: (Full) Family Communication: none at bedside.  Disposition Plan: pending further eval. , possibly home in 1 to 2 days.    Consultants:   PCCM  Nephrology.   IR   Procedures: RENAL BIOPSY on 3/5   Antimicrobials: none.    Subjective: Some back pain.   Objective: Vitals:   07/12/16 1639 07/12/16 2059 07/13/16 0433 07/13/16 0917  BP: 111/75 122/61 139/84 (!) 151/81  Pulse: 75 62 63 61  Resp: 14 15 16 16   Temp: 99 F (37.2 C) 98.6 F (37 C) 98 F (36.7 C) 97.8 F (36.6 C)  TempSrc: Oral Oral Oral Oral  SpO2: 100% 100% 100% 94%  Weight:  51 kg  (112 lb 7 oz)    Height:        Intake/Output Summary (Last 24 hours) at 07/13/16 1120 Last data filed at 07/13/16 0917  Gross per 24 hour  Intake           2177.5 ml  Output              750 ml  Net           1427.5 ml   Filed Weights   07/10/16 2055 07/11/16 2113 07/12/16 2059  Weight: 49.9 kg (110 lb 0.2 oz) 50.2 kg (110 lb 10.7 oz) 51 kg (112 lb 7 oz)    Examination:  General exam: Appears calm and comfortable  Respiratory system: Clear to auscultation. Respiratory effort normal. Cardiovascular system: S1 & S2 heard, RRR. No JVD, murmurs, rubs, gallops or clicks. No pedal edema. Gastrointestinal system: Abdomen is nondistended, soft and nontender. No organomegaly or masses felt. Normal bowel sounds heard. Central nervous system: Alert and oriented. No focal neurological deficits. Extremities: Symmetric 5 x 5 power.     Data Reviewed: I have personally reviewed following labs and imaging studies  CBC:  Recent Labs Lab 07/08/16 1437 07/09/16 0726 07/10/16 0914 07/12/16 0522  WBC 14.6* 11.9* 13.4* 8.6  NEUTROABS 12.3*  --   --   --   HGB 10.0* 8.9* 8.1* 7.7*  HCT 31.9* 27.6* 24.8* 23.6*  MCV 85.1 85.2 84.1 84.3  PLT 426* 333 325 578   Basic Metabolic Panel:  Recent Labs Lab 07/08/16 1437 07/09/16 0726 07/10/16 0914 07/13/16 0600  NA 135 134* 133* 136  K 3.8 4.1 3.7 4.4  CL 102 101 103 108  CO2 25 22 21* 19*  GLUCOSE 120* 69 132* 85  BUN 38* 32* 43* 31*  CREATININE 3.12* 2.94* 3.39* 3.10*  CALCIUM 8.9 8.6* 8.8* 9.0  MG  --  1.4*  --   --   PHOS  --  4.2  --  4.1   GFR: Estimated Creatinine Clearance: 16.5 mL/min (by C-G formula based on SCr of 3.1 mg/dL (H)). Liver Function Tests:  Recent Labs Lab 07/08/16 1437 07/13/16 0600  AST 17  --   ALT 11*  --   ALKPHOS 62  --   BILITOT 0.7  --   PROT 6.8  --   ALBUMIN 3.2* 2.9*   No results for input(s): LIPASE, AMYLASE in the last 168 hours. No results for input(s): AMMONIA in the last 168  hours. Coagulation Profile:  Recent Labs Lab 2016-07-22 0522  INR 1.06   Cardiac Enzymes:  Recent Labs Lab 07/08/16 2113 07/09/16 0215 07/09/16 0726  TROPONINI 0.21* 0.17* 0.14*   BNP (last 3 results) No results for input(s): PROBNP in the last 8760 hours. HbA1C: No results for input(s): HGBA1C in the last 72 hours. CBG:  Recent Labs Lab 07/08/16 1407 07/09/16 1309  GLUCAP 101* 133*   Lipid Profile: No results for input(s): CHOL, HDL, LDLCALC, TRIG, CHOLHDL, LDLDIRECT in the last 72 hours. Thyroid Function Tests: No results for input(s): TSH, T4TOTAL, FREET4, T3FREE, THYROIDAB in the last 72 hours. Anemia Panel: No results for input(s): VITAMINB12, FOLATE, FERRITIN, TIBC, IRON, RETICCTPCT in the last 72 hours. Sepsis Labs:  Recent Labs Lab 07/08/16 1501 07/08/16 1733 07/08/16 2251 07/09/16 0726  PROCALCITON  --   --  0.23 0.31  LATICACIDVEN 1.46 0.96  --   --     Recent Results (from the past 240 hour(s))  Blood Culture (routine x 2)     Status: None (Preliminary result)   Collection Time: 07/08/16  2:30 PM  Result Value Ref Range Status   Specimen Description BLOOD LEFT ANTECUBITAL  Final   Special Requests BOTTLES DRAWN AEROBIC AND ANAEROBIC 5CC  Final   Culture NO GROWTH 4 DAYS  Final   Report Status PENDING  Incomplete  Blood Culture (routine x 2)     Status: None (Preliminary result)   Collection Time: 07/08/16  2:49 PM  Result Value Ref Range Status   Specimen Description BLOOD RIGHT ANTECUBITAL  Final   Special Requests BOTTLES DRAWN AEROBIC AND ANAEROBIC 10CC  Final   Culture NO GROWTH 4 DAYS  Final   Report Status PENDING  Incomplete  MRSA PCR Screening     Status: None   Collection Time: 07/08/16 10:51 PM  Result Value Ref Range Status   MRSA by PCR NEGATIVE NEGATIVE Final    Comment:        The GeneXpert MRSA Assay (FDA approved for NASAL specimens only), is one component of a comprehensive MRSA colonization surveillance program. It is  not intended to diagnose MRSA infection nor to guide or monitor treatment for MRSA infections.          Radiology Studies: US Biopsy  Result Date: 2016-07-22 INDICATION: 56 year old female with renal disease, referred for biopsy EXAM: ULTRASOUND BIOPSY CORE LIVER MEDICATIONS: None. ANESTHESIA/SEDATION: Moderate (conscious) sedation was employed during this procedure. A total of Versed 2.0 mg and Fentanyl 50 mcg was administered intravenously.  Moderate Sedation Time: 15 minutes. The patient's level of consciousness and vital signs were monitored continuously by radiology nursing throughout the procedure under my direct supervision. FLUOROSCOPY TIME:  None COMPLICATIONS: None PROCEDURE: Informed written consent was obtained from the patient after a thorough discussion of the procedural risks, benefits and alternatives. All questions were addressed. Maximal Sterile Barrier Technique was utilized including caps, mask, sterile gowns, sterile gloves, sterile drape, hand hygiene and skin antiseptic. A timeout was performed prior to the initiation of the procedure. Patient was positioned prone position on the gantry table. Images were stored sent to PACs. Once the patient is prepped and draped in the usual sterile fashion, the skin and subcutaneous tissues overlying the left kidney were generously infiltrated 1% lidocaine for local anesthesia. Using ultrasound guidance, a 15 gauge guide needle was advanced into the lower cortex of the left kidney. Once we confirmed location of the needle tip, 2 separate 16 gauge core biopsy were achieved. Two Gel-Foam pledgets were infused with a small amount of saline. The needle was removed. Final images were stored. The patient tolerated the procedure well and remained hemodynamically stable throughout. No complications were encountered and no significant blood loss encountered. IMPRESSION: Status post ultrasound-guided left sided medical renal biopsy. Signed, Dulcy Fanny.  Earleen Newport, DO [facility] Electronically Signed   By: Corrie Mckusick D.O.   On: 07/12/2016 14:04        Scheduled Meds: . amLODipine  10 mg Oral Daily  . heparin subcutaneous  5,000 Units Subcutaneous Q8H  . hydrALAZINE  25 mg Oral Q8H  . hydroxychloroquine  300 mg Oral Daily  . labetalol  200 mg Oral BID  . mycophenolate  1,000 mg Oral BID  . pantoprazole  40 mg Oral Daily  . predniSONE  50 mg Oral Q breakfast  . senna-docusate  2 tablet Oral BID   Continuous Infusions: . sodium chloride 50 mL/hr at 07/12/16 1700     LOS: 5 days    Time spent: 30 minutes.     Hosie Poisson, MD [facility] Pager [phone]  If 7PM-7AM, please contact night-coverage [email or web address] Password [email or web address] 07/13/2016, 11:20 AM

## 2016-07-13 NOTE — Progress Notes (Signed)
Patient is complaining of nausea. MD notified. Verbal order to give Zofran IV 4mg  q6h PRN for nausea. Orders placed. Will continue to monitor.

## 2016-07-13 NOTE — Progress Notes (Signed)
S: CO some Lt flank pain post renal bx on that side.  No gross hematuria O:BP 139/84 (BP Location: Right Arm)   Pulse 63   Temp 98 F (36.7 C) (Oral)   Resp 16   Ht 5\' 4"  (1.626 m)   Wt 51 kg (112 lb 7 oz)   SpO2 100%   BMI 19.30 kg/m   Intake/Output Summary (Last 24 hours) at 07/13/16 0905 Last data filed at 07/13/16 0800  Gross per 24 hour  Intake           1937.5 ml  Output              450 ml  Net           1487.5 ml   Weight change: 0.8 kg (1 lb 12.2 oz) YBW:LSLHT and alert CVS: RRR Resp:clear Abd:+ BS NTND Ext: no edema + Lt CVA tenderness NEURO:CNI Ox3 no asterixis   . amLODipine  10 mg Oral Daily  . heparin subcutaneous  5,000 Units Subcutaneous Q8H  . hydrALAZINE  25 mg Oral Q8H  . hydroxychloroquine  300 mg Oral Daily  . labetalol  200 mg Oral BID  . mycophenolate  1,000 mg Oral BID  . pantoprazole  40 mg Oral Daily  . predniSONE  50 mg Oral Q breakfast  . senna-docusate  2 tablet Oral BID   US Biopsy  Result Date: 07/12/2016 INDICATION: 56 year old female with renal disease, referred for biopsy EXAM: ULTRASOUND BIOPSY CORE LIVER MEDICATIONS: None. ANESTHESIA/SEDATION: Moderate (conscious) sedation was employed during this procedure. A total of Versed 2.0 mg and Fentanyl 50 mcg was administered intravenously. Moderate Sedation Time: 15 minutes. The patient's level of consciousness and vital signs were monitored continuously by radiology nursing throughout the procedure under my direct supervision. FLUOROSCOPY TIME:  None COMPLICATIONS: None PROCEDURE: Informed written consent was obtained from the patient after a thorough discussion of the procedural risks, benefits and alternatives. All questions were addressed. Maximal Sterile Barrier Technique was utilized including caps, mask, sterile gowns, sterile gloves, sterile drape, hand hygiene and skin antiseptic. A timeout was performed prior to the initiation of the procedure. Patient was positioned prone position on  the gantry table. Images were stored sent to PACs. Once the patient is prepped and draped in the usual sterile fashion, the skin and subcutaneous tissues overlying the left kidney were generously infiltrated 1% lidocaine for local anesthesia. Using ultrasound guidance, a 15 gauge guide needle was advanced into the lower cortex of the left kidney. Once we confirmed location of the needle tip, 2 separate 16 gauge core biopsy were achieved. Two Gel-Foam pledgets were infused with a small amount of saline. The needle was removed. Final images were stored. The patient tolerated the procedure well and remained hemodynamically stable throughout. No complications were encountered and no significant blood loss encountered. IMPRESSION: Status post ultrasound-guided left sided medical renal biopsy. Signed, Dulcy Fanny. Earleen Newport, DO [facility] Electronically Signed   By: Corrie Mckusick D.O.   On: 07/12/2016 14:04   BMET    Component Value Date/Time   NA 136 07/13/2016 0600   K 4.4 07/13/2016 0600   CL 108 07/13/2016 0600   CO2 19 (L) 07/13/2016 0600   GLUCOSE 85 07/13/2016 0600   BUN 31 (H) 07/13/2016 0600   CREATININE 3.10 (H) 07/13/2016 0600   CALCIUM 9.0 07/13/2016 0600   GFRNONAA 16 (L) 07/13/2016 0600   GFRAA 18 (L) 07/13/2016 0600   CBC  Component Value Date/Time   WBC 8.6 07/12/2016 0522   RBC 2.80 (L) 07/12/2016 0522   HGB 7.7 (L) 07/12/2016 0522   HCT 23.6 (L) 07/12/2016 0522   PLT 301 07/12/2016 0522   MCV 84.3 07/12/2016 0522   MCH 27.5 07/12/2016 0522   MCHC 32.6 07/12/2016 0522   RDW 16.0 (H) 07/12/2016 0522   LYMPHSABS 1.1 07/08/2016 1437   MONOABS 1.2 (H) 07/08/2016 1437   EOSABS 0.1 07/08/2016 1437   BASOSABS 0.0 07/08/2016 1437     Assessment: 1. ? Lupus nephritis. Scr sl lower, UO not well recorded 2. Anemia 3. SLE 4. HTN urgency, BP under better control  Plan: 1. Awaiting renal bx results 2. Cont pred and  cellcept. 3. She needs FU appt with her rheumatologist at [place]. 4. She has FU appt with Dr Justin Mend 07/29/16 11AM Monteen Toops T

## 2016-07-14 DIAGNOSIS — M329 Systemic lupus erythematosus, unspecified: Secondary | ICD-10-CM

## 2016-07-14 DIAGNOSIS — N183 Chronic kidney disease, stage 3 (moderate): Secondary | ICD-10-CM

## 2016-07-14 DIAGNOSIS — R1310 Dysphagia, unspecified: Secondary | ICD-10-CM

## 2016-07-14 DIAGNOSIS — E440 Moderate protein-calorie malnutrition: Secondary | ICD-10-CM | POA: Diagnosis present

## 2016-07-14 LAB — RENAL FUNCTION PANEL
ALBUMIN: 2.9 g/dL — AB (ref 3.5–5.0)
Anion gap: 13 (ref 5–15)
BUN: 31 mg/dL — AB (ref 6–20)
CALCIUM: 9.3 mg/dL (ref 8.9–10.3)
CO2: 18 mmol/L — ABNORMAL LOW (ref 22–32)
CREATININE: 3.25 mg/dL — AB (ref 0.44–1.00)
Chloride: 104 mmol/L (ref 101–111)
GFR calc Af Amer: 17 mL/min — ABNORMAL LOW (ref >60)
GFR, EST NON AFRICAN AMERICAN: 15 mL/min — AB (ref >60)
GLUCOSE: 79 mg/dL (ref 65–99)
PHOSPHORUS: 3.6 mg/dL (ref 2.5–4.6)
Potassium: 4.2 mmol/L (ref 3.5–5.1)
SODIUM: 135 mmol/L (ref 135–145)

## 2016-07-14 MED ORDER — ENSURE ENLIVE PO LIQD
237.0000 mL | Freq: Two times a day (BID) | ORAL | Status: DC
  Administered 2016-07-14 – 2016-07-16 (×4): 237 mL via ORAL

## 2016-07-14 NOTE — Consult Note (Signed)
[facility] CONSULT Reason for consult: dysphagia and abnormal CT scan Referring Physician: [facility]. PCP: unclear has seen multiple specialists primary G.I.: unassigned  Kathleen [name] is an 56 y.o. female.  HPI: she has a history of lupus followed by rheumatology as well as severe hypertension in history of TIAs. She's had several recent admissions, last several weeks ago. She receives her rheumatology clear at [place]. Because of her Lupus she has CKD and has been followed by rheumatology in the hospital. She has had a prior history of TB, endometriosis, COPD, history of cardiac arrest and history of ARF. She was readmitted with severe hypertensive crisis, decrease urine output and abdominal pain. She has a known umbilical hernia. Because of her SLE she had previously been treated with plaque when ill, CellCept, and prednisone. It appears that she was taking these on a regular basis. The patient was complaining of abdominal pain and a CT scan was obtained in the emergency room showing marked gastric wall thickening in the cardio the stomach as well as gallstones which been present for no other acute findings. The patient reports that she has had problems eating for several months, 2 to 3 months she thinks. She describes difficulty swallowing pills as well as solid foods. Occasionally things would get stuck and she would vomit up food and did not appear to be digested. Apparently liquids seem to generally be okay but does cause some discomfort in her subxiphoid area. She has been having pain in this area that gets worse with leading or drinking liquids particularly cool liquids. She is a bit vague on timing of all this but reports that a year ago she was able to eat pretty much whatever she wanted without difficulty. She does have [name] listed on her medical problems told me she did not take any medicine for this is an outpatient although she might've taken something in the past. The  information from her admission several weeks ago indicates that she was sent home on Protonix tablets 40 mg BID. Patient denies taking any additional antacids. Her hemoglobin was 8.9 slowly dropped 7.7. She does have a history of chronic anemia. She does not weigh yourself and is not sure if she's lost weight recently.  Past Medical History:  Diagnosis Date  . Acute renal failure (ARF) ([place])   . Anemia   . Arthritis   . Cardiac arrest ([place])   . Cellulitis   . Cerebral vasculitis   . COPD (chronic obstructive pulmonary disease) ([place])   . Elevated antinuclear antibody (ANA) level   . Endometriosis   . Gallstones   . Hypertension   . Lupus   . Migraine   . Seizures ([place])   . Sepsis ([place])   . Suicide attempt   . TB (tuberculosis)   . Transient ischemic attack (TIA)   . Vasculitis of skin     Past Surgical History:  Procedure Laterality Date  . CESAREAN SECTION    . TUBAL LIGATION      No family history on file.  Social History:  reports that she has been smoking Cigarettes.  She has been smoking about 0.50 packs per day. She has never used smokeless tobacco. She reports that she does not drink alcohol or use drugs.  Allergies: No Known Allergies  Medications; Prior to Admission medications   Medication Sig Start Date End Date Taking? Authorizing Provider  cholecalciferol (VITAMIN D) 1000 units tablet Take 1,000 Units by mouth daily.   Yes Historical Provider, MD  felodipine (PLENDIL) 5 MG 24 hr tablet Take 2 tablets (10 mg total) by mouth daily. 06/29/16  Yes Patrecia Pour, MD  hydroxychloroquine (PLAQUENIL) 200 MG tablet Take 1.5 tablets (300 mg total) by mouth daily. 06/29/16  Yes Patrecia Pour, MD  mycophenolate (CELLCEPT) 500 MG tablet Take 2 tablets (1,000 mg total) by mouth 2 (two) times daily. 06/29/16  Yes Patrecia Pour, MD  predniSONE (DELTASONE) 20 MG tablet Take 2 tablets (40 mg total) by mouth daily with breakfast. Take two tablets by mouth daily until rheumatology  follow up. 06/29/16  Yes Patrecia Pour, MD  sodium bicarbonate 650 MG tablet Take 1 tablet (650 mg total) by mouth daily. 06/29/16  Yes Patrecia Pour, MD  vitamin C (ASCORBIC ACID) 500 MG tablet Take 500 mg by mouth daily.   Yes Historical Provider, MD  guaifenesin (HUMIBID E) 400 MG TABS tablet Take 400 mg by mouth 2 (two) times daily.    Historical Provider, MD  pantoprazole (PROTONIX) 40 MG tablet Take 40 mg by mouth 2 (two) times daily.    Historical Provider, MD   . amLODipine  10 mg Oral Daily  . heparin subcutaneous  5,000 Units Subcutaneous Q8H  . hydrALAZINE  25 mg Oral Q8H  . hydroxychloroquine  300 mg Oral Daily  . labetalol  200 mg Oral BID  . mycophenolate  1,000 mg Oral BID  . pantoprazole  40 mg Oral Daily  . predniSONE  50 mg Oral Q breakfast  . senna-docusate  2 tablet Oral BID   PRN Meds ondansetron (ZOFRAN) IV, oxyCODONE Results for orders placed or performed during the hospital encounter of 07/08/16 (from the past 48 hour(s))  Renal function panel     Status: Abnormal   Collection Time: 07/13/16  6:00 AM  Result Value Ref Range   Sodium 136 135 - 145 mmol/[name]   Potassium 4.4 3.5 - 5.1 mmol/[name]   Chloride 108 101 - 111 mmol/[name]   CO2 19 ([name]) 22 - 32 mmol/[name]   Glucose, Bld 85 65 - 99 mg/dL   BUN 31 (H) 6 - 20 mg/dL   Creatinine, Ser 3.10 (H) 0.44 - 1.00 mg/dL   Calcium 9.0 8.9 - 10.3 mg/dL   Phosphorus 4.1 2.5 - 4.6 mg/dL   Albumin 2.9 ([name]) 3.5 - 5.0 g/dL   GFR calc non Af Amer 16 ([name]) >60 mL/min   GFR calc Af Amer 18 ([name]) >60 mL/min    Comment: (NOTE) The eGFR has been calculated using the CKD EPI equation. This calculation has not been validated in all clinical situations. eGFR's persistently <60 mL/min signify possible Chronic Kidney Disease.    Anion gap 9 5 - 15  Renal function panel     Status: Abnormal   Collection Time: 07/14/16  7:14 AM  Result Value Ref Range   Sodium 135 135 - 145 mmol/[name]   Potassium 4.2 3.5 - 5.1 mmol/[name]   Chloride 104 101 - 111 mmol/[name]    CO2 18 ([name]) 22 - 32 mmol/[name]   Glucose, Bld 79 65 - 99 mg/dL   BUN 31 (H) 6 - 20 mg/dL   Creatinine, Ser 3.25 (H) 0.44 - 1.00 mg/dL   Calcium 9.3 8.9 - 10.3 mg/dL   Phosphorus 3.6 2.5 - 4.6 mg/dL   Albumin 2.9 ([name]) 3.5 - 5.0 g/dL   GFR calc non Af Amer 15 ([name]) >60 mL/min   GFR calc Af Amer 17 ([name]) >60 mL/min    Comment: (NOTE)  The eGFR has been calculated using the CKD EPI equation. This calculation has not been validated in all clinical situations. eGFR's persistently <60 mL/min signify possible Chronic Kidney Disease.    Anion gap 13 5 - 15    US Biopsy  Result Date: 07/12/2016 INDICATION: 56 year old female with renal disease, referred for biopsy EXAM: ULTRASOUND BIOPSY CORE LIVER MEDICATIONS: None. ANESTHESIA/SEDATION: Moderate (conscious) sedation was employed during this procedure. A total of Versed 2.0 mg and Fentanyl 50 mcg was administered intravenously. Moderate Sedation Time: 15 minutes. The patient's level of consciousness and vital signs were monitored continuously by radiology nursing throughout the procedure under my direct supervision. FLUOROSCOPY TIME:  None COMPLICATIONS: None PROCEDURE: Informed written consent was obtained from the patient after a thorough discussion of the procedural risks, benefits and alternatives. All questions were addressed. Maximal Sterile Barrier Technique was utilized including caps, mask, sterile gowns, sterile gloves, sterile drape, hand hygiene and skin antiseptic. A timeout was performed prior to the initiation of the procedure. Patient was positioned prone position on the gantry table. Images were stored sent to PACs. Once the patient is prepped and draped in the usual sterile fashion, the skin and subcutaneous tissues overlying the left kidney were generously infiltrated 1% lidocaine for local anesthesia. Using ultrasound guidance, a 15 gauge guide needle was advanced into the lower cortex of the left kidney. Once we confirmed location of the needle  tip, 2 separate 16 gauge core biopsy were achieved. Two Gel-Foam pledgets were infused with a small amount of saline. The needle was removed. Final images were stored. The patient tolerated the procedure well and remained hemodynamically stable throughout. No complications were encountered and no significant blood loss encountered. IMPRESSION: Status post ultrasound-guided left sided medical renal biopsy. Signed, Dulcy Fanny. Earleen Newport, DO [facility] Radiology Electronically Signed   By: Corrie Mckusick D.O.   On: 07/12/2016 14:04              Blood pressure (!) 153/89, pulse 77, temperature 98.5 F (36.9 C), temperature source Oral, resp. rate 16, height 5' 4"  (1.626 m), weight 51 kg (112 lb 7 oz), SpO2 100 %.  Physical exam:   General-- frail African-American female ENT-- throw grossly normal with no evidence of oral thrush, nonicteric Neck-- supple Heart-- regular rate and rhythm without murmurs gallops Lungs--clear Abdomen-- mild substernal tenderness, not really very impressive. Mass not palpated bowel sounds normal Psych-- alert oriented answers questions appropriately   Assessment: 1. Dysphagia/abnormal CT scan of the Cardia. I think we need to assume that this is a tumor the Cardia until proven otherwise. She will need a EGD and possible balloon dilatation with biopsies. 2. SLE with exacerbation with AKI renal biopsy pending  3. Chronic anemia  Plan: 1. We will proceed in the morning with EGD, balloon dilatation and biopsy is needed. Have discussed this with the patient and she is agreeable.   Dalessandro Baldyga JR,Kathleen [name] 07/14/2016, 10:42 AM   This note was created using voice recognition software and minor errors may Have occurred unintentionally. Pager: [phone] If no answer or after hours call [phone]

## 2016-07-14 NOTE — Progress Notes (Signed)
S: CO some nausea and food sticking.  Able to eat some and take in fluids O:BP 135/85 (BP Location: Right Arm)   Pulse 61   Temp 98.2 F (36.8 C) (Oral)   Resp 18   Ht 5\' 4"  (1.626 m)   Wt 51 kg (112 lb 7 oz)   SpO2 100%   BMI 19.30 kg/m   Intake/Output Summary (Last 24 hours) at 07/14/16 0951 Last data filed at 07/14/16 0806  Gross per 24 hour  Intake             1520 ml  Output              825 ml  Net              695 ml   Weight change:  NWG:NFAOZ and alert CVS: RRR Resp:clear Abd:+ BS, soft, mild diffuse tenderness Ext: no edema + Lt CVA tenderness NEURO:CNI Ox3 no asterixis   . amLODipine  10 mg Oral Daily  . heparin subcutaneous  5,000 Units Subcutaneous Q8H  . hydrALAZINE  25 mg Oral Q8H  . hydroxychloroquine  300 mg Oral Daily  . labetalol  200 mg Oral BID  . mycophenolate  1,000 mg Oral BID  . pantoprazole  40 mg Oral Daily  . predniSONE  50 mg Oral Q breakfast  . senna-docusate  2 tablet Oral BID   US Biopsy  Result Date: 07/12/2016 INDICATION: 56 year old female with renal disease, referred for biopsy EXAM: ULTRASOUND BIOPSY CORE LIVER MEDICATIONS: None. ANESTHESIA/SEDATION: Moderate (conscious) sedation was employed during this procedure. A total of Versed 2.0 mg and Fentanyl 50 mcg was administered intravenously. Moderate Sedation Time: 15 minutes. The patient's level of consciousness and vital signs were monitored continuously by radiology nursing throughout the procedure under my direct supervision. FLUOROSCOPY TIME:  None COMPLICATIONS: None PROCEDURE: Informed written consent was obtained from the patient after a thorough discussion of the procedural risks, benefits and alternatives. All questions were addressed. Maximal Sterile Barrier Technique was utilized including caps, mask, sterile gowns, sterile gloves, sterile drape, hand hygiene and skin antiseptic. A timeout was performed prior to the initiation of the procedure. Patient was positioned prone  position on the gantry table. Images were stored sent to PACs. Once the patient is prepped and draped in the usual sterile fashion, the skin and subcutaneous tissues overlying the left kidney were generously infiltrated 1% lidocaine for local anesthesia. Using ultrasound guidance, a 15 gauge guide needle was advanced into the lower cortex of the left kidney. Once we confirmed location of the needle tip, 2 separate 16 gauge core biopsy were achieved. Two Gel-Foam pledgets were infused with a small amount of saline. The needle was removed. Final images were stored. The patient tolerated the procedure well and remained hemodynamically stable throughout. No complications were encountered and no significant blood loss encountered. IMPRESSION: Status post ultrasound-guided left sided medical renal biopsy. Signed, Dulcy Fanny. Earleen Newport, DO [facility] Radiology Electronically Signed   By: Corrie Mckusick D.O.   On: 07/12/2016 14:04   BMET    Component Value Date/Time   NA 135 07/14/2016 0714   K 4.2 07/14/2016 0714   CL 104 07/14/2016 0714   CO2 18 (L) 07/14/2016 0714   GLUCOSE 79 07/14/2016 0714   BUN 31 (H) 07/14/2016 0714   CREATININE 3.25 (H) 07/14/2016 0714   CALCIUM 9.3 07/14/2016 0714   GFRNONAA 15 (L) 07/14/2016 0714   GFRAA 17 (L) 07/14/2016 3086  CBC    Component Value Date/Time   WBC 8.6 07/12/2016 0522   RBC 2.80 (L) 07/12/2016 0522   HGB 7.7 (L) 07/12/2016 0522   HCT 23.6 (L) 07/12/2016 0522   PLT 301 07/12/2016 0522   MCV 84.3 07/12/2016 0522   MCH 27.5 07/12/2016 0522   MCHC 32.6 07/12/2016 0522   RDW 16.0 (H) 07/12/2016 0522   LYMPHSABS 1.1 07/08/2016 1437   MONOABS 1.2 (H) 07/08/2016 1437   EOSABS 0.1 07/08/2016 1437   BASOSABS 0.0 07/08/2016 1437     Assessment: 1. ? 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 Anemia 3. SLE 4. HTN urgency, BP under better control 5. Dysphasia  Plan: 1.  Still Awaiting renal bx results 2. Cont pred and cellcept. 3. She needs FU appt with her rheumatologist at [place]. 4. She has FU appt with Dr Justin Mend 07/29/16 11AM Cybil [place] T

## 2016-07-14 NOTE — Progress Notes (Signed)
PROGRESS NOTE  Kathleen [name]  HYW:[id number] DOB: March 22, 1961 DOA: 07/08/2016 PCP: Pcp Not In System   Brief Narrative:  56 year old female with past medical history as below, which is significant for systemic lupus erythematous (followed loosely by Dr. Annabelle Harman at [facility] rheumatology), vasculitis, COPD, hypertension, seizures, suicide attempt in the past, presents with generalized weakness and abdominal discomfort. She was found to be in hypertensive emergency and in acute on CKD 3. She was admitted to ICU for hypertensive emergency and started on nicardipine infusion. Her bp improved and she was transferred to [facility] service for further management. 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 Her abdominal pain has continued (previously thought to be due to umbilical hernia without strangulation) and she endorses dysphagia. GI is consulted for further recommendations.  Assessment & Plan:   Active Problems:   Hypertensive urgency  Hypertensive urgency: Improving with titration of medications. Initially required cardene gtt.  Much better this am.  Continue with same regimen on discharge.   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 Outpatient rheumatologist started prednisone empirically. Renal biopsy done Monday by IR to rule out lupus nephritis. NO evidence of volume overload.  - Follow up the results of the biopsy from 3/5.  - Resume cell cept and prednisone. - Further management as per renal.  - Continue to monitor creatinine. Improving.  GERD: With diffuse gastric inflammation on admission CT. Also endorses chronic and worsening dysphagia.  - With dysphagia and abdominal pain being pt's presenting symptom, I have consulted GI for evaluation, consideration for EGD.  - Resume protonix.   Constipation:  - resume stool softeners.   Lupus:  - Resume hydroxychloroquine 300 mg daily.  - Continue prednisone.   DVT prophylaxis: heparin.  Code  Status: (Full) Family Communication: none at bedside.  Disposition Plan: pending further eval. by GI, possibly home in 1 to 2 days.   Consultants:   PCCM  Nephrology.   IR   Procedures: RENAL BIOPSY on 3/5   Antimicrobials: none.   Subjective: Pt endorsing ongoing abdominal pain, states if she was discharged she'd just have to come back because her stomach aches, she's got intermittent nausea and vomiting worse with food.   Objective: Vitals:   07/13/16 1513 07/13/16 1730 07/13/16 2205 07/14/16 0528  BP: 129/89 138/81 (!) 161/91 135/85  Pulse:  63 63 61  Resp:  16 18 18   Temp:  98.2 F (36.8 C) 98 F (36.7 C) 98.2 F (36.8 C)  TempSrc:  Oral Oral Oral  SpO2:  100%  100%  Weight:      Height:        Intake/Output Summary (Last 24 hours) at 07/14/16 0949 Last data filed at 07/14/16 0806  Gross per 24 hour  Intake             1520 ml  Output              825 ml  Net              695 ml   Filed Weights   07/10/16 2055 07/11/16 2113 07/12/16 2059  Weight: 49.9 kg (110 lb 0.2 oz) 50.2 kg (110 lb 10.7 oz) 51 kg (112 lb 7 oz)    Examination:  General exam: Appears calm and comfortable  Respiratory system: Clear to auscultation. Respiratory effort normal. Cardiovascular system: S1 & S2 heard, RRR. No JVD, murmurs, rubs, gallops or clicks. No pedal edema. Gastrointestinal system: Abdomen is soft, tender in epigastrium and  paraumbilically. No organomegaly or masses felt. Normal bowel sounds heard. Central nervous system: Alert and oriented. No focal neurological deficits. Extremities: No deformities.   Data Reviewed: I have personally reviewed following labs and imaging studies  CBC:  Recent Labs Lab 07/08/16 1437 07/09/16 0726 07/10/16 0914 07/12/16 0522  WBC 14.6* 11.9* 13.4* 8.6  NEUTROABS 12.3*  --   --   --   HGB 10.0* 8.9* 8.1* 7.7*  HCT 31.9* 27.6* 24.8* 23.6*  MCV 85.1 85.2 84.1 84.3  PLT 426* 333 325 976   Basic Metabolic Panel:  Recent  Labs Lab 07/08/16 1437 07/09/16 0726 07/10/16 0914 07/13/16 0600 07/14/16 0714  NA 135 134* 133* 136 135  K 3.8 4.1 3.7 4.4 4.2  CL 102 101 103 108 104  CO2 25 22 21* 19* 18*  GLUCOSE 120* 69 132* 85 79  BUN 38* 32* 43* 31* 31*  CREATININE 3.12* 2.94* 3.39* 3.10* 3.25*  CALCIUM 8.9 8.6* 8.8* 9.0 9.3  MG  --  1.4*  --   --   --   PHOS  --  4.2  --  4.1 3.6   GFR: Estimated Creatinine Clearance: 15.7 mL/min (by C-G formula based on SCr of 3.25 mg/dL (H)). Liver Function Tests:  Recent Labs Lab 07/08/16 1437 07/13/16 0600 07/14/16 0714  AST 17  --   --   ALT 11*  --   --   ALKPHOS 62  --   --   BILITOT 0.7  --   --   PROT 6.8  --   --   ALBUMIN 3.2* 2.9* 2.9*   No results for input(s): LIPASE, AMYLASE in the last 168 hours. No results for input(s): AMMONIA in the last 168 hours. Coagulation Profile:  Recent Labs Lab 07/12/16 0522  INR 1.06   Cardiac Enzymes:  Recent Labs Lab 07/08/16 2113 07/09/16 0215 07/09/16 0726  TROPONINI 0.21* 0.17* 0.14*   BNP (last 3 results) No results for input(s): PROBNP in the last 8760 hours. HbA1C: No results for input(s): HGBA1C in the last 72 hours. CBG:  Recent Labs Lab 07/08/16 1407 07/09/16 1309  GLUCAP 101* 133*   Lipid Profile: No results for input(s): CHOL, HDL, LDLCALC, TRIG, CHOLHDL, LDLDIRECT in the last 72 hours. Thyroid Function Tests: No results for input(s): TSH, T4TOTAL, FREET4, T3FREE, THYROIDAB in the last 72 hours. Anemia Panel: No results for input(s): VITAMINB12, FOLATE, FERRITIN, TIBC, IRON, RETICCTPCT in the last 72 hours. Sepsis Labs:  Recent Labs Lab 07/08/16 1501 07/08/16 1733 07/08/16 2251 07/09/16 0726  PROCALCITON  --   --  0.23 0.31  LATICACIDVEN 1.46 0.96  --   --     Recent Results (from the past 240 hour(s))  Blood Culture (routine x 2)     Status: None   Collection Time: 07/08/16  2:30 PM  Result Value Ref Range Status   Specimen Description BLOOD LEFT ANTECUBITAL  Final    Special Requests BOTTLES DRAWN AEROBIC AND ANAEROBIC 5CC  Final   Culture NO GROWTH 5 DAYS  Final   Report Status 07/13/2016 FINAL  Final  Blood Culture (routine x 2)     Status: None   Collection Time: 07/08/16  2:49 PM  Result Value Ref Range Status   Specimen Description BLOOD RIGHT ANTECUBITAL  Final   Special Requests BOTTLES DRAWN AEROBIC AND ANAEROBIC 10CC  Final   Culture NO GROWTH 5 DAYS  Final   Report Status 07/13/2016 FINAL  Final  MRSA PCR Screening  Status: None   Collection Time: 07/08/16 10:51 PM  Result Value Ref Range Status   MRSA by PCR NEGATIVE NEGATIVE Final    Comment:        The GeneXpert MRSA Assay (FDA approved for NASAL specimens only), is one component of a comprehensive MRSA colonization surveillance program. It is not intended to diagnose MRSA infection nor to guide or monitor treatment for MRSA infections.     Radiology Studies: US Biopsy  Result Date: 21-Jul-2016 INDICATION: 56 year old female with renal disease, referred for biopsy EXAM: ULTRASOUND BIOPSY CORE LIVER MEDICATIONS: None. ANESTHESIA/SEDATION: Moderate (conscious) sedation was employed during this procedure. A total of Versed 2.0 mg and Fentanyl 50 mcg was administered intravenously. Moderate Sedation Time: 15 minutes. The patient's level of consciousness and vital signs were monitored continuously by radiology nursing throughout the procedure under my direct supervision. FLUOROSCOPY TIME:  None COMPLICATIONS: None PROCEDURE: Informed written consent was obtained from the patient after a thorough discussion of the procedural risks, benefits and alternatives. All questions were addressed. Maximal Sterile Barrier Technique was utilized including caps, mask, sterile gowns, sterile gloves, sterile drape, hand hygiene and skin antiseptic. A timeout was performed prior to the initiation of the procedure. Patient was positioned prone position on the gantry table. Images were stored sent to PACs.  Once the patient is prepped and draped in the usual sterile fashion, the skin and subcutaneous tissues overlying the left kidney were generously infiltrated 1% lidocaine for local anesthesia. Using ultrasound guidance, a 15 gauge guide needle was advanced into the lower cortex of the left kidney. Once we confirmed location of the needle tip, 2 separate 16 gauge core biopsy were achieved. Two Gel-Foam pledgets were infused with a small amount of saline. The needle was removed. Final images were stored. The patient tolerated the procedure well and remained hemodynamically stable throughout. No complications were encountered and no significant blood loss encountered. IMPRESSION: Status post ultrasound-guided left sided medical renal biopsy. Signed, Dulcy Fanny. Earleen Newport, DO [facility] Radiology Electronically Signed   By: Corrie Mckusick D.O.   On: Jul 21, 2016 14:04   Scheduled Meds: . amLODipine  10 mg Oral Daily  . heparin subcutaneous  5,000 Units Subcutaneous Q8H  . hydrALAZINE  25 mg Oral Q8H  . hydroxychloroquine  300 mg Oral Daily  . labetalol  200 mg Oral BID  . mycophenolate  1,000 mg Oral BID  . pantoprazole  40 mg Oral Daily  . predniSONE  50 mg Oral Q breakfast  . senna-docusate  2 tablet Oral BID   Continuous Infusions: . sodium chloride 50 mL/hr at 07/14/16 0558    LOS: 6 days   Time spent: 25 minutes.   Vance Gather, MD [facility] Pager [phone]  If 7PM-7AM, please contact night-coverage [email or web address] Password [facility] 07/14/2016, 9:49 AM

## 2016-07-14 NOTE — Progress Notes (Signed)
Initial Nutrition Assessment  DOCUMENTATION CODES:   Non-severe (moderate) malnutrition in context of chronic illness  INTERVENTION:  Provide Ensure Enlive po BID, each supplement provides 350 kcal and 20 grams of protein.  Encourage adequate PO intake.   NUTRITION DIAGNOSIS:   Malnutrition related to chronic illness as evidenced by energy intake < 75% for > or equal to 1 month, moderate depletions of muscle mass.  GOAL:   Patient will meet greater than or equal to 90% of their needs  MONITOR:   PO intake, Supplement acceptance, Labs, Weight trends, Skin, I & O's  REASON FOR ASSESSMENT:   Consult Assessment of nutrition requirement/status (poor po)  ASSESSMENT:   56 year old female with past medical history as below, which is significant for systemic lupus erythematous vasculitis, COPD, hypertension, seizures, suicide attempt in the past, presents with generalized weakness and abdominal discomfort. She was found to be in hypertensive emergency and in acute on CKD 3. 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 Her abdominal pain has continued (previously thought to be due to umbilical hernia without strangulation) and she endorses dysphagia.   Plans for EGD, balloon dilatation and biopsy tomorrow morning. Meal completion has been varied form 10-100% with po intake of 25% this AM. Pt reports difficulty swallowing solid foods which has been ongoing over the past 2 months. Pt reports she would try to consume at least 3 meals a day however unable to complete her food at meals. She reports she has been consuming mostly soups (chicken noodle soup) at home due to her dysphagia. Pt reports her weight usually fluctuates thus unable to provide usual body weight. Pt is agreeable to nutritional supplements to aid in caloric and protein needs. Pt reports she is able to tolerate liquids. RD to order.   Nutrition-Focused physical exam completed. Findings are no fat depletion,  moderate muscle depletion, and no edema.   Labs and medications reviewed.   Diet Order:  Diet 2 gram sodium Room service appropriate? Yes; Fluid consistency: Thin  Skin:   (Incision on L flank)  Last BM:  3/6  Height:   Ht Readings from Last 1 Encounters:  07/08/16 5\' 4"  (1.626 m)    Weight:   Wt Readings from Last 1 Encounters:  07/12/16 112 lb 7 oz (51 kg)    Ideal Body Weight:  54.5 kg  BMI:  Body mass index is 19.3 kg/m.  Estimated Nutritional Needs:   Kcal:  1600-1800  Protein:  65-75 grams  Fluid:  1.6 - 1.8 L/day  EDUCATION NEEDS:   No education needs identified at this time  Corrin Parker, MS, RD, LDN Pager # [phone] After hours/ weekend pager # [phone]

## 2016-07-15 ENCOUNTER — Encounter (HOSPITAL_COMMUNITY)
Admission: EM | Disposition: A | Discharge status: Home / Self Care | Payer: Self-pay | Source: Home / Self Care | Attending: Internal Medicine

## 2016-07-15 ENCOUNTER — Inpatient Hospital Stay (HOSPITAL_COMMUNITY): Payer: Medicare Other | Admitting: Anesthesiology

## 2016-07-15 ENCOUNTER — Encounter (HOSPITAL_COMMUNITY): Payer: Self-pay | Admitting: Anesthesiology

## 2016-07-15 DIAGNOSIS — E440 Moderate protein-calorie malnutrition: Secondary | ICD-10-CM

## 2016-07-15 HISTORY — PX: ESOPHAGOGASTRODUODENOSCOPY: SHX5428

## 2016-07-15 LAB — RENAL FUNCTION PANEL
ANION GAP: 10 (ref 5–15)
Albumin: 3.2 g/dL — ABNORMAL LOW (ref 3.5–5.0)
BUN: 31 mg/dL — ABNORMAL HIGH (ref 6–20)
CHLORIDE: 108 mmol/L (ref 101–111)
CO2: 17 mmol/L — AB (ref 22–32)
CREATININE: 3.25 mg/dL — AB (ref 0.44–1.00)
Calcium: 9.1 mg/dL (ref 8.9–10.3)
GFR, EST AFRICAN AMERICAN: 17 mL/min — AB (ref >60)
GFR, EST NON AFRICAN AMERICAN: 15 mL/min — AB (ref >60)
Glucose, Bld: 83 mg/dL (ref 65–99)
POTASSIUM: 4.1 mmol/L (ref 3.5–5.1)
Phosphorus: 3.1 mg/dL (ref 2.5–4.6)
Sodium: 135 mmol/L (ref 135–145)

## 2016-07-15 SURGERY — EGD (ESOPHAGOGASTRODUODENOSCOPY)
Anesthesia: Monitor Anesthesia Care

## 2016-07-15 MED ORDER — PROPOFOL 500 MG/50ML IV EMUL
INTRAVENOUS | Status: DC | PRN
  Administered 2016-07-15: 75 ug/kg/min via INTRAVENOUS

## 2016-07-15 MED ORDER — PROPOFOL 10 MG/ML IV BOLUS
INTRAVENOUS | Status: DC | PRN
  Administered 2016-07-15: 20 mg via INTRAVENOUS

## 2016-07-15 MED ORDER — SODIUM CHLORIDE 0.9 % IV SOLN
INTRAVENOUS | Status: DC | PRN
  Administered 2016-07-15: 11:00:00 via INTRAVENOUS

## 2016-07-15 MED ORDER — SODIUM CHLORIDE 0.9 % IV SOLN
INTRAVENOUS | Status: DC
  Administered 2016-07-15: 500 mL via INTRAVENOUS

## 2016-07-15 MED ORDER — BUTAMBEN-TETRACAINE-BENZOCAINE 2-2-14 % EX AERO
INHALATION_SPRAY | CUTANEOUS | Status: DC | PRN
  Administered 2016-07-15: 1 via TOPICAL

## 2016-07-15 NOTE — H&P (View-Only) (Signed)
[facility] CONS[facility] Reason for consult: dysphagia and abnormal CT scan Referring Physician: [facility]. PCP: unclear has seen multiple specialists primary G.I.: unassigned  Kathleen [name] is an 56 y.o. female.  HPI: she has a history of lupus followed by rheumatology as well as severe hypertension in history of TIAs. She's had several recent admissions, last several weeks ago. She receives her rheumatology clear at [facility]. Because of her Lupus she has CKD and has been followed by rheumatology in the hospital. She has had a prior history of TB, endometriosis, COPD, history of cardiac arrest and history of ARF. She was readmitted with severe hypertensive crisis, decrease urine output and abdominal pain. She has a known umbilical hernia. Because of her SLE she had previously been treated with plaque when ill, CellCept, and prednisone. It appears that she was taking these on a regular basis. The patient was complaining of abdominal pain and a CT scan was obtained in the emergency room showing marked gastric wall thickening in the cardio the stomach as well as gallstones which been present for no other acute findings. The patient reports that she has had problems eating for several months, 2 to 3 months she thinks. She describes difficulty swallowing pills as well as solid foods. Occasionally things would get stuck and she would vomit up food and did not appear to be digested. Apparently liquids seem to generally be okay but does cause some discomfort in her subxiphoid area. She has been having pain in this area that gets worse with leading or drinking liquids particularly cool liquids. She is a bit vague on timing of all this but reports that a year ago she was able to eat pretty much whatever she wanted without difficulty. She does have [name] listed on her medical problems told me she did not take any medicine for this is an outpatient although she might've taken something in the past. The  information from her admission several weeks ago indicates that she was sent home on Protonix tablets 40 mg BID. Patient denies taking any additional antacids. Her hemoglobin was 8.9 slowly dropped 7.7. She does have a history of chronic anemia. She does not weigh yourself and is not sure if she's lost weight recently.  Past Medical History:  Diagnosis Date  . Acute renal failure (ARF) ([place])   . Anemia   . Arthritis   . Cardiac arrest ([place])   . Cellulitis   . Cerebral vasculitis   . COPD (chronic obstructive pulmonary disease) ([place])   . Elevated antinuclear antibody (ANA) level   . Endometriosis   . Gallstones   . Hypertension   . Lupus   . Migraine   . Seizures ([place])   . Sepsis ([place])   . Suicide attempt   . TB (tuberculosis)   . Transient ischemic attack (TIA)   . Vasculitis of skin     Past Surgical History:  Procedure Laterality Date  . CESAREAN SECTION    . TUBAL LIGATION      No family history on file.  Social History:  reports that she has been smoking Cigarettes.  She has been smoking about 0.50 packs per day. She has never used smokeless tobacco. She reports that she does not drink alcohol or use drugs.  Allergies: No Known Allergies  Medications; Prior to Admission medications   Medication Sig Start Date End Date Taking? Authorizing Provider  cholecalciferol (VITAMIN D) 1000 units tablet Take 1,000 Units by mouth daily.   Yes Historical Provider, MD  felodipine (PLENDIL) 5 MG 24 hr tablet Take 2 tablets (10 mg total) by mouth daily. 06/29/16  Yes Patrecia Pour, MD  hydroxychloroquine (PLAQUENIL) 200 MG tablet Take 1.5 tablets (300 mg total) by mouth daily. 06/29/16  Yes Patrecia Pour, MD  mycophenolate (CELLCEPT) 500 MG tablet Take 2 tablets (1,000 mg total) by mouth 2 (two) times daily. 06/29/16  Yes Patrecia Pour, MD  predniSONE (DELTASONE) 20 MG tablet Take 2 tablets (40 mg total) by mouth daily with breakfast. Take two tablets by mouth daily until rheumatology  follow up. 06/29/16  Yes Patrecia Pour, MD  sodium bicarbonate 650 MG tablet Take 1 tablet (650 mg total) by mouth daily. 06/29/16  Yes Patrecia Pour, MD  vitamin C (ASCORBIC ACID) 500 MG tablet Take 500 mg by mouth daily.   Yes Historical Provider, MD  guaifenesin (HUMIBID E) 400 MG TABS tablet Take 400 mg by mouth 2 (two) times daily.    Historical Provider, MD  pantoprazole (PROTONIX) 40 MG tablet Take 40 mg by mouth 2 (two) times daily.    Historical Provider, MD   . amLODipine  10 mg Oral Daily  . heparin subcutaneous  5,000 Units Subcutaneous Q8H  . hydrALAZINE  25 mg Oral Q8H  . hydroxychloroquine  300 mg Oral Daily  . labetalol  200 mg Oral BID  . mycophenolate  1,000 mg Oral BID  . pantoprazole  40 mg Oral Daily  . predniSONE  50 mg Oral Q breakfast  . senna-docusate  2 tablet Oral BID   PRN Meds ondansetron (ZOFRAN) IV, oxyCODONE Results for orders placed or performed during the hospital encounter of 07/08/16 (from the past 48 hour(s))  Renal function panel     Status: Abnormal   Collection Time: 07/13/16  6:00 AM  Result Value Ref Range   Sodium 136 135 - 145 mmol/L   Potassium 4.4 3.5 - 5.1 mmol/L   Chloride 108 101 - 111 mmol/L   CO2 19 (L) 22 - 32 mmol/L   Glucose, Bld 85 65 - 99 mg/dL   BUN 31 (H) 6 - 20 mg/dL   Creatinine, Ser 3.10 (H) 0.44 - 1.00 mg/dL   Calcium 9.0 8.9 - 10.3 mg/dL   Phosphorus 4.1 2.5 - 4.6 mg/dL   Albumin 2.9 (L) 3.5 - 5.0 g/dL   GFR calc non Af Amer 16 (L) >60 mL/min   GFR calc Af Amer 18 (L) >60 mL/min    Comment: (NOTE) The eGFR has been calculated using the CKD EPI equation. This calculation has not been validated in all clinical situations. eGFR's persistently <60 mL/min signify possible Chronic Kidney Disease.    Anion gap 9 5 - 15  Renal function panel     Status: Abnormal   Collection Time: 07/14/16  7:14 AM  Result Value Ref Range   Sodium 135 135 - 145 mmol/L   Potassium 4.2 3.5 - 5.1 mmol/L   Chloride 104 101 - 111 mmol/L    CO2 18 (L) 22 - 32 mmol/L   Glucose, Bld 79 65 - 99 mg/dL   BUN 31 (H) 6 - 20 mg/dL   Creatinine, Ser 3.25 (H) 0.44 - 1.00 mg/dL   Calcium 9.3 8.9 - 10.3 mg/dL   Phosphorus 3.6 2.5 - 4.6 mg/dL   Albumin 2.9 (L) 3.5 - 5.0 g/dL   GFR calc non Af Amer 15 (L) >60 mL/min   GFR calc Af Amer 17 (L) >60 mL/min    Comment: (NOTE)  The eGFR has been calculated using the CKD EPI equation. This calculation has not been validated in all clinical situations. eGFR's persistently <60 mL/min signify possible Chronic Kidney Disease.    Anion gap 13 5 - 15    US Biopsy  Result Date: 07/12/2016 INDICATION: 56 year old female with renal disease, referred for biopsy EXAM: ULTRASOUND BIOPSY CORE LIVER MEDICATIONS: None. ANESTHESIA/SEDATION: Moderate (conscious) sedation was employed during this procedure. A total of Versed 2.0 mg and Fentanyl 50 mcg was administered intravenously. Moderate Sedation Time: 15 minutes. The patient's level of consciousness and vital signs were monitored continuously by radiology nursing throughout the procedure under my direct supervision. FLUOROSCOPY TIME:  None COMPLICATIONS: None PROCEDURE: Informed written consent was obtained from the patient after a thorough discussion of the procedural risks, benefits and alternatives. All questions were addressed. Maximal Sterile Barrier Technique was utilized including caps, mask, sterile gowns, sterile gloves, sterile drape, hand hygiene and skin antiseptic. A timeout was performed prior to the initiation of the procedure. Patient was positioned prone position on the gantry table. Images were stored sent to PACs. Once the patient is prepped and draped in the usual sterile fashion, the skin and subcutaneous tissues overlying the left kidney were generously infiltrated 1% lidocaine for local anesthesia. Using ultrasound guidance, a 15 gauge guide needle was advanced into the lower cortex of the left kidney. Once we confirmed location of the needle  tip, 2 separate 16 gauge core biopsy were achieved. Two Gel-Foam pledgets were infused with a small amount of saline. The needle was removed. Final images were stored. The patient tolerated the procedure well and remained hemodynamically stable throughout. No complications were encountered and no significant blood loss encountered. IMPRESSION: Status post ultrasound-guided left sided medical renal biopsy. Signed, Dulcy Fanny. Earleen Newport, DO [facility] Electronically Signed   By: Corrie Mckusick D.O.   On: 07/12/2016 14:04              Blood pressure (!) 153/89, pulse 77, temperature 98.5 F (36.9 C), temperature source Oral, resp. rate 16, height 5' 4"  (1.626 m), weight 51 kg (112 lb 7 oz), SpO2 100 %.  Physical exam:   General-- frail African-American female ENT-- throw grossly normal with no evidence of oral thrush, nonicteric Neck-- supple Heart-- regular rate and rhythm without murmurs gallops Lungs--clear Abdomen-- mild substernal tenderness, not really very impressive. Mass not palpated bowel sounds normal Psych-- alert oriented answers questions appropriately   Assessment: 1. Dysphagia/abnormal CT scan of the Cardia. I think we need to assume that this is a tumor the Cardia until proven otherwise. She will need a EGD and possible balloon dilatation with biopsies. 2. SLE with exacerbation with AKI renal biopsy pending  3. Chronic anemia  Plan: 1. We will proceed in the morning with EGD, balloon dilatation and biopsy is needed. Have discussed this with the patient and she is agreeable.   Christalynn Boise JR,Sherronda Sweigert L 07/14/2016, 10:42 AM   This note was created using voice recognition software and minor errors may Have occurred unintentionally. Pager: [phone] If no answer or after hours call [phone]

## 2016-07-15 NOTE — Transfer of Care (Signed)
Immediate Anesthesia Transfer of Care Note  Patient: Kathleen [name]  Procedure(s) Performed: Procedure(s): ESOPHAGOGASTRODUODENOSCOPY (EGD) (N/A)  Patient Location: Endoscopy Unit  Anesthesia Type:MAC  Level of Consciousness: awake, alert  and oriented  Airway & Oxygen Therapy: Patient Spontanous Breathing  Post-op Assessment: Report given to RN and Post -op Vital signs reviewed and stable  Post vital signs: Reviewed and stable  Last Vitals:  Vitals:   07/15/16 1100 07/15/16 1212  BP: (!) 147/83 120/79  Pulse: 63 66  Resp: 12 12  Temp: 36.9 C 36.8 C    Last Pain:  Vitals:   07/15/16 1212  TempSrc: Oral  PainSc:       Patients Stated Pain Goal: 0 (42/10/31 2811)  Complications: No apparent anesthesia complications

## 2016-07-15 NOTE — Anesthesia Preprocedure Evaluation (Addendum)
Anesthesia Evaluation  Patient identified by MRN, date of birth, ID band Patient awake    Reviewed: Allergy & Precautions, NPO status , Patient's Chart, lab work & pertinent test results  Airway Mallampati: I       Dental  (+) Upper Dentures   Pulmonary COPD, Current Smoker,    breath sounds clear to auscultation       Cardiovascular hypertension, + Peripheral Vascular Disease   Rhythm:Regular Rate:Normal     Neuro/Psych  Headaches, Seizures -,  negative psych ROS   GI/Hepatic negative GI ROS, Neg liver ROS,   Endo/Other  negative endocrine ROS  Renal/GU Renal disease  negative genitourinary   Musculoskeletal  (+) Arthritis ,   Abdominal   Peds negative pediatric ROS (+)  Hematology negative hematology ROS (+)   Anesthesia Other Findings   Reproductive/Obstetrics negative OB ROS                            Anesthesia Physical Anesthesia Plan  ASA: II  Anesthesia Plan: MAC   Post-op Pain Management:    Induction: Intravenous  Airway Management Planned: Natural Airway  Additional Equipment:   Intra-op Plan:   Post-operative Plan:   Informed Consent: I have reviewed the patients History and Physical, chart, labs and discussed the procedure including the risks, benefits and alternatives for the proposed anesthesia with the patient or authorized representative who has indicated his/her understanding and acceptance.     Plan Discussed with: CRNA  Anesthesia Plan Comments:         Anesthesia Quick Evaluation

## 2016-07-15 NOTE — Progress Notes (Signed)
S: Still CO dysphagia O:BP (!) 174/94 (BP Location: Right Arm)   Pulse 70   Temp 97.9 F (36.6 C) (Oral)   Resp 19   Ht 5\' 4"  (1.626 m)   Wt 50.1 kg (110 lb 6.4 oz)   SpO2 100%   BMI 18.95 kg/m   Intake/Output Summary (Last 24 hours) at 07/15/16 0851 Last data filed at 07/15/16 0612  Gross per 24 hour  Intake              950 ml  Output              800 ml  Net              150 ml   Weight change:  JQB:HALPF and alert CVS: RRR Resp:clear Abd:+ BS, soft, mild diffuse tenderness Ext: no edema NEURO:CNI Ox3 no asterixis   . amLODipine  10 mg Oral Daily  . feeding supplement (ENSURE ENLIVE)  237 mL Oral BID BM  . heparin subcutaneous  5,000 Units Subcutaneous Q8H  . hydrALAZINE  25 mg Oral Q8H  . hydroxychloroquine  300 mg Oral Daily  . labetalol  200 mg Oral BID  . mycophenolate  1,000 mg Oral BID  . pantoprazole  40 mg Oral Daily  . predniSONE  50 mg Oral Q breakfast  . senna-docusate  2 tablet Oral BID   No results found. BMET    Component Value Date/Time   NA 135 07/14/2016 0714   K 4.2 07/14/2016 0714   CL 104 07/14/2016 0714   CO2 18 (L) 07/14/2016 0714   GLUCOSE 79 07/14/2016 0714   BUN 31 (H) 07/14/2016 0714   CREATININE 3.25 (H) 07/14/2016 0714   CALCIUM 9.3 07/14/2016 0714   GFRNONAA 15 (L) 07/14/2016 0714   GFRAA 17 (L) 07/14/2016 0714   CBC    Component Value Date/Time   WBC 8.6 07/12/2016 0522   RBC 2.80 (L) 07/12/2016 0522   HGB 7.7 (L) 07/12/2016 0522   HCT 23.6 (L) 07/12/2016 0522   PLT 301 07/12/2016 0522   MCV 84.3 07/12/2016 0522   MCH 27.5 07/12/2016 0522   MCHC 32.6 07/12/2016 0522   RDW 16.0 (H) 07/12/2016 0522   LYMPHSABS 1.1 07/08/2016 1437   MONOABS 1.2 (H) 07/08/2016 1437   EOSABS 0.1 07/08/2016 1437   BASOSABS 0.0 07/08/2016 1437     Assessment: 1. ? 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  Bx just showed scarring and probably result of sampling error 2. Anemia 3. SLE 4. HTN  urgency, BP under better control 5. Dysphasia  Plan: 1. For EGD today 2. Cont pred and cellcept. 3. She needs FU appt with her rheumatologist at [facility]. 4. She has FU appt with Dr Justin Mend 07/29/16 11AM 5. Awaiting labs from today Breyanna Valera T

## 2016-07-15 NOTE — Anesthesia Postprocedure Evaluation (Addendum)
Anesthesia Post Note  Patient: Kathleen [name]  Procedure(s) Performed: Procedure(s) (LRB): ESOPHAGOGASTRODUODENOSCOPY (EGD) (N/A)  Patient location during evaluation: PACU Anesthesia Type: MAC Level of consciousness: awake and alert Pain management: pain level controlled Vital Signs Assessment: post-procedure vital signs reviewed and stable Respiratory status: spontaneous breathing, nonlabored ventilation, respiratory function stable and patient connected to nasal cannula oxygen Cardiovascular status: stable and blood pressure returned to baseline Anesthetic complications: no       Last Vitals:  Vitals:   07/15/16 1230 07/15/16 1235  BP: (!) 143/86 (!) 143/86  Pulse: 61 60  Resp: 12 12  Temp:      Last Pain:  Vitals:   07/15/16 1212  TempSrc: Oral  PainSc:                  Effie Berkshire

## 2016-07-15 NOTE — Progress Notes (Signed)
PROGRESS NOTE  Kathleen [name]  TKP:[id number] DOB: 1961-04-24 DOA: 07/08/2016 PCP: Pcp Not In System   Brief Narrative:  56 year old female with past medical history as below, which is significant for systemic lupus erythematous (followed loosely by Dr. Annabelle Harman at [facility] rheumatology), vasculitis, COPD, hypertension, seizures, suicide attempt in the past, presents with generalized weakness and abdominal discomfort. She was found to be in hypertensive emergency and in acute on CKD 3. She was admitted to ICU for hypertensive emergency and started on nicardipine infusion. Her bp improved and she was transferred to [facility] service for further management. 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 Her abdominal pain has continued (previously thought to be due to umbilical hernia without strangulation) and she endorses dysphagia. GI is consulted for further recommendations.  Assessment & Plan:   Active Problems:   Hypertensive urgency   Malnutrition of moderate degree  Hypertensive urgency: Improving with titration of medications. Initially required cardene gtt.  Much better this am.  Continue with same regimen on discharge.   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 Outpatient rheumatologist started prednisone empirically. 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  - Resume cell cept and prednisone. - Further management as per renal.  - Continue to monitor creatinine.  GERD: With diffuse gastric inflammation on admission CT. Also endorses chronic and worsening dysphagia.  - GI consulted, EGD negative 3/8.  - Resume protonix.   Constipation:  - resume stool softeners.   Lupus:  - Resume hydroxychloroquine 300 mg daily.  - Continue prednisone.   DVT prophylaxis: heparin.  Code Status:  (Full) Family Communication: none at bedside.  Disposition Plan: Possibly home in 1 to 2 days.   Consultants:   PCCM  Nephrology.   IR  GI, Dr. Oletta Lamas  Procedures:  RENAL BIOPSY on 3/5 EGD 07/15/2016  Antimicrobials: none.   Subjective: Pt woozy after EGD. No vomiting.   Objective: Vitals:   07/15/16 1225 07/15/16 1230 07/15/16 1235 07/15/16 1529  BP: 122/77 (!) 143/86 (!) 143/86 124/71  Pulse: 64 61 60 70  Resp: 12 12 12    Temp:      TempSrc:      SpO2: 100% 100% 100%   Weight:      Height:        Intake/Output Summary (Last 24 hours) at 07/15/16 1600 Last data filed at 07/15/16 1500  Gross per 24 hour  Intake              645 ml  Output              400 ml  Net              245 ml   Filed Weights   07/11/16 2113 07/12/16 2059 07/14/16 2138  Weight: 50.2 kg (110 lb 10.7 oz) 51 kg (112 lb 7 oz) 50.1 kg (110 lb 6.4 oz)    Examination:  General exam: Appears calm and comfortable  Respiratory system: Clear to auscultation. Respiratory effort normal. Cardiovascular system: S1 & S2 heard, RRR. No JVD, murmurs, rubs, gallops or clicks. No pedal edema. Gastrointestinal system: Abdomen is soft, tender in epigastrium and paraumbilically. No organomegaly or masses felt. Normal bowel sounds heard. Central nervous system: Alert and oriented. No focal neurological deficits. Extremities: No deformities.   Data Reviewed: I have personally reviewed following labs and imaging studies  CBC:  Recent Labs Lab  07/09/16 0726 07/10/16 0914 07/12/16 0522  WBC 11.9* 13.4* 8.6  HGB 8.9* 8.1* 7.7*  HCT 27.6* 24.8* 23.6*  MCV 85.2 84.1 84.3  PLT 333 325 767   Basic Metabolic Panel:  Recent Labs Lab 07/09/16 0726 07/10/16 0914 07/13/16 0600 07/14/16 0714 07/15/16 0940  NA 134* 133* 136 135 135  K 4.1 3.7 4.4 4.2 4.1  CL 101 103 108 104 108  CO2 22 21* 19* 18* 17*  GLUCOSE 69 132* 85 79 83  BUN 32* 43* 31* 31* 31*  CREATININE 2.94* 3.39* 3.10* 3.25* 3.25*   CALCIUM 8.6* 8.8* 9.0 9.3 9.1  MG 1.4*  --   --   --   --   PHOS 4.2  --  4.1 3.6 3.1   GFR: Estimated Creatinine Clearance: 15.5 mL/min (by C-G formula based on SCr of 3.25 mg/dL (H)). Liver Function Tests:  Recent Labs Lab 07/13/16 0600 07/14/16 0714 07/15/16 0940  ALBUMIN 2.9* 2.9* 3.2*   No results for input(s): LIPASE, AMYLASE in the last 168 hours. No results for input(s): AMMONIA in the last 168 hours. Coagulation Profile:  Recent Labs Lab 07/12/16 0522  INR 1.06   Cardiac Enzymes:  Recent Labs Lab 07/08/16 2113 07/09/16 0215 07/09/16 0726  TROPONINI 0.21* 0.17* 0.14*   BNP (last 3 results) No results for input(s): PROBNP in the last 8760 hours. HbA1C: No results for input(s): HGBA1C in the last 72 hours. CBG:  Recent Labs Lab 07/09/16 1309  GLUCAP 133*   Lipid Profile: No results for input(s): CHOL, HDL, LDLCALC, TRIG, CHOLHDL, LDLDIRECT in the last 72 hours. Thyroid Function Tests: No results for input(s): TSH, T4TOTAL, FREET4, T3FREE, THYROIDAB in the last 72 hours. Anemia Panel: No results for input(s): VITAMINB12, FOLATE, FERRITIN, TIBC, IRON, RETICCTPCT in the last 72 hours. Sepsis Labs:  Recent Labs Lab 07/08/16 1733 07/08/16 2251 07/09/16 0726  PROCALCITON  --  0.23 0.31  LATICACIDVEN 0.96  --   --     Recent Results (from the past 240 hour(s))  Blood Culture (routine x 2)     Status: None   Collection Time: 07/08/16  2:30 PM  Result Value Ref Range Status   Specimen Description BLOOD LEFT ANTECUBITAL  Final   Special Requests BOTTLES DRAWN AEROBIC AND ANAEROBIC 5CC  Final   Culture NO GROWTH 5 DAYS  Final   Report Status 07/13/2016 FINAL  Final  Blood Culture (routine x 2)     Status: None   Collection Time: 07/08/16  2:49 PM  Result Value Ref Range Status   Specimen Description BLOOD RIGHT ANTECUBITAL  Final   Special Requests BOTTLES DRAWN AEROBIC AND ANAEROBIC 10CC  Final   Culture NO GROWTH 5 DAYS  Final   Report Status  07/13/2016 FINAL  Final  MRSA PCR Screening     Status: None   Collection Time: 07/08/16 10:51 PM  Result Value Ref Range Status   MRSA by PCR NEGATIVE NEGATIVE Final    Comment:        The GeneXpert MRSA Assay (FDA approved for NASAL specimens only), is one component of a comprehensive MRSA colonization surveillance program. It is not intended to diagnose MRSA infection nor to guide or monitor treatment for MRSA infections.     Radiology Studies: No results found. Scheduled Meds: . amLODipine  10 mg Oral Daily  . feeding supplement (ENSURE ENLIVE)  237 mL Oral BID BM  . heparin subcutaneous  5,000 Units Subcutaneous Q8H  . hydrALAZINE  25  mg Oral Q8H  . hydroxychloroquine  300 mg Oral Daily  . labetalol  200 mg Oral BID  . mycophenolate  1,000 mg Oral BID  . pantoprazole  40 mg Oral Daily  . predniSONE  50 mg Oral Q breakfast  . senna-docusate  2 tablet Oral BID   Continuous Infusions: . sodium chloride 50 mL/hr at 07/15/16 0242    LOS: 7 days   Time spent: 25 minutes.   Vance Gather, MD [facility] Pager [phone]  If 7PM-7AM, please contact night-coverage [email or web address] Password [email or web address] 07/15/2016, 4:00 PM

## 2016-07-15 NOTE — Anesthesia Procedure Notes (Signed)
Procedure Name: MAC Date/Time: 07/15/2016 1:42 AM Performed by: Eligha Bridegroom Pre-anesthesia Checklist: Patient identified Patient Re-evaluated:Patient Re-evaluated prior to inductionOxygen Delivery Method: Nasal cannula Preoxygenation: Pre-oxygenation with 100% oxygen Intubation Type: IV induction

## 2016-07-15 NOTE — Op Note (Signed)
[facility] Patient Name: Kathleen [name] Procedure Date : 07/15/2016 MRN: [id number] Attending MD: Tresea Mall Dr., MD Date of Birth: [birth year] CSN: [id number] Age: 56 Admit Type: Inpatient Procedure:                Upper GI endoscopy Indications:              Dysphagia, Abnormal CT of the GI tract Providers:                Fayrene Fearing L. Valeen Borys Dr., MD, Dwain Sarna, RN, Harrington Challenger, Technician Referring MD:              Medicines:                Monitored Anesthesia Care Complications:            No immediate complications. Estimated Blood Loss:     Estimated blood loss: none. Procedure:                Pre-Anesthesia Assessment:                           - Prior to the procedure, a History and Physical                            was performed, and patient medications and                            allergies were reviewed. The patient's tolerance of                            previous anesthesia was also reviewed. The risks                            and benefits of the procedure and the sedation                            options and risks were discussed with the patient.                            All questions were answered, and informed consent                            was obtained. Prior Anticoagulants: The patient has                            taken no previous anticoagulant or antiplatelet                            agents. ASA Grade Assessment: II - A patient with                            mild systemic disease. After reviewing the risks  and benefits, the patient was deemed in                            satisfactory condition to undergo the procedure.                           After obtaining informed consent, the endoscope was                            passed under direct vision. Throughout the                            procedure, the patient's blood pressure, pulse, and   oxygen saturations were monitored continuously. The                            EG-[id number] ([id number]) scope was introduced through the                            mouth, and advanced to the second part of duodenum.                            The upper GI endoscopy was accomplished without                            difficulty. The patient tolerated the procedure                            well. Scope In: Scope Out: Findings:      There is no endoscopic evidence of Barrett's esophagus, esophagitis,       mucosal abnormalities, stenosis, stricture, varices or mass in the       entire esophagus.      There is no endoscopic evidence of varices, mass or tumor in the cardia.      The exam of the stomach was otherwise normal.      The examined duodenum was normal. Impression:               - Normal examined duodenum.                           - No specimens collected.                           - Abnormal Imaging normal endoscopic exam Moderate Sedation:      MAC by anesthesia Recommendation:           - Return patient to hospital ward for ongoing care.                           - Resume previous diet.                           - Continue present medications. Procedure Code(s):        --- Professional ---  32440, Esophagogastroduodenoscopy, flexible,                            transoral; diagnostic, including collection of                            specimen(s) by brushing or washing, when performed                            (separate procedure) Diagnosis Code(s):        --- Professional ---                           R13.10, Dysphagia, unspecified                           R93.3, Abnormal findings on diagnostic imaging of                            other parts of digestive tract CPT copyright 2016 American [employer] Association. All rights reserved. The codes documented in this report are preliminary and upon coder review may  be revised to meet current compliance  requirements. Tresea Mall Dr., MD 07/15/2016 12:14:29 PM This report has been signed electronically. Number of Addenda: 0

## 2016-07-15 NOTE — Interval H&P Note (Signed)
History and Physical Interval Note:  07/15/2016 11:32 AM  Kathleen [name]  has presented today for surgery, with the diagnosis of abnormal CT  The various methods of treatment have been discussed with the patient and family. After consideration of risks, benefits and other options for treatment, the patient has consented to  Procedure(s): ESOPHAGOGASTRODUODENOSCOPY (EGD) (N/A) as a surgical intervention .  The patient's history has been reviewed, patient examined, no change in status, stable for surgery.  I have reviewed the patient's chart and labs.  Questions were answered to the patient's satisfaction.     Andoni Busch JR,Arlee Bossard L

## 2016-07-16 DIAGNOSIS — N289 Disorder of kidney and ureter, unspecified: Secondary | ICD-10-CM

## 2016-07-16 DIAGNOSIS — L930 Discoid lupus erythematosus: Secondary | ICD-10-CM

## 2016-07-16 DIAGNOSIS — I161 Hypertensive emergency: Secondary | ICD-10-CM

## 2016-07-16 LAB — BASIC METABOLIC PANEL
Anion gap: 10 (ref 5–15)
BUN: 31 mg/dL — AB (ref 6–20)
CO2: 17 mmol/L — ABNORMAL LOW (ref 22–32)
CREATININE: 3.27 mg/dL — AB (ref 0.44–1.00)
Calcium: 9.2 mg/dL (ref 8.9–10.3)
Chloride: 108 mmol/L (ref 101–111)
GFR calc Af Amer: 17 mL/min — ABNORMAL LOW (ref >60)
GFR, EST NON AFRICAN AMERICAN: 15 mL/min — AB (ref >60)
GLUCOSE: 80 mg/dL (ref 65–99)
Potassium: 4.2 mmol/L (ref 3.5–5.1)
SODIUM: 135 mmol/L (ref 135–145)

## 2016-07-16 LAB — CBC
HCT: 24.4 % — ABNORMAL LOW (ref 36.0–46.0)
Hemoglobin: 7.8 g/dL — ABNORMAL LOW (ref 12.0–15.0)
MCH: 27.5 pg (ref 26.0–34.0)
MCHC: 32 g/dL (ref 30.0–36.0)
MCV: 85.9 fL (ref 78.0–100.0)
PLATELETS: 337 10*3/uL (ref 150–400)
RBC: 2.84 MIL/uL — ABNORMAL LOW (ref 3.87–5.11)
RDW: 17.3 % — AB (ref 11.5–15.5)
WBC: 6.7 10*3/uL (ref 4.0–10.5)

## 2016-07-16 MED ORDER — HYDRALAZINE HCL 25 MG PO TABS: 25.0000 mg | ORAL_TABLET | Freq: Three times a day (TID) | ORAL | 0 refills | Status: DC

## 2016-07-16 MED ORDER — LABETALOL HCL 200 MG PO TABS
200.0000 mg | ORAL_TABLET | Freq: Two times a day (BID) | ORAL | 0 refills | Status: DC
Start: 2016-07-16 — End: 2017-07-07

## 2016-07-16 NOTE — Discharge Summary (Signed)
Physician Discharge Summary  Kathleen [name] TDV:[id number] DOB: 22-Apr-1961 DOA: 07/08/2016  PCP: Pcp Not In System  Admit date: 07/08/2016 Discharge date: 07/16/2016  Admitted From: Home Disposition: Home   Recommendations for Outpatient Follow-up:  1. Follow up with nephrology and rheumatology scheduled prior to discharge 2. Please obtain BMP/CBC in one week to monitor CKD (unknown stage) and anemia.  3. Monitor blood pressure, multiple medications started as below  Home Health: None recommended Equipment/Devices: None Discharge Condition: Stable CODE STATUS: Full Diet recommendation: heart healthy  Brief/Interim Summary: Kathleen [name] is a 56 y.o. female with a  past medical history including systemic lupus erythematous (followed loosely by Dr. Annabelle Harman at [facility]), vasculitis, COPD, hypertension, seizures, and suicide attempt in the past, who presented 3/1 with generalized weakness and abdominal discomfort. She was found to be in hypertensive emergency and in acute on CKD 3. She was admitted to ICU for hypertensive emergency and started on nicardipine infusion. Her BP improved and she was transferred to [facility] service for further management. Her blood pressure has remained under good control with titration of blood pressure medications. 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 Her abdominal pain has improved (previously thought to be due to umbilical hernia without strangulation) and she has tolerated a diet, though her appetite remains poor. GI was consulted due to ongoing symptoms but EGD was negative. Overall, she has improved and has ambulated with PT in the halls without any issues. She will be discharged with scheduled follow up with nephrology and rheumatology.  Discharge Diagnoses:  Active Problems:   Hypertensive urgency   Malnutrition of moderate  degree  Hypertensive urgency: Resolved. Initially required cardene gtt.  - Felodipine 10mg  daily - Hydralazine 25mg  q8h - Labetalol 200mg  BID - Controlled on the above po medications, will continue with same regimen on discharge.   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 - Outpatient rheumatologist started prednisone empirically, which will be continued.  - Resume cell cept and prednisone. - Further management per renal: Had nephrology follow up at [facility] later this month.  - Continue to monitor creatinine at follow up.  GERD: With diffuse gastric inflammation on admission CT. EGD performed 3/8 was normal.  - Resume PPI BID  Constipation: - Resumed stool softeners and laxative with subsequent mild loose stools. Will continue these as needed to achieve 1 soft BM daily.   Lupus: With history of nonadherence to medications. - Resume hydroxychloroquine 300 mg daily.  - Resumed cellcept. Per notes from her rheumatologist's office: [place] submitted a Prior Authorization request for Mycophenolate to [employer] on 07/05/2016. Prior Authorization was approved from 07/05/2016 to 05/09/2017. PA # [id number]. Ran test claim, pt's co-pay is $1.25. - Continue prednisone.   Anemia of chronic disease: Presumed chronic, though no priors for baseline she appears chronically ill with h/o transfusions related to nonhemorrhagic anemia and has CKD. Hgb stable 8.1 - 7.8 during admission with normal MCV. - Recheck CBC is stable with no signs of bleeding and improvement in weakness. - Consider anemia panel/further investigation at follow up.   Discharge Instructions Discharge Instructions    Discharge instructions    Complete by:   As directed    You were admitted for weakness and high blood  pressure, both of which has resolved. It is important that you continue to take your medications as directed and follow up as directed.  - Continue taking felodipine 10mg  daily - Start taking 2 other blood pressure medications: labetalol 200mg  twice daily and hydralazine 25mg  every 8 hours - Take cellcept (this has been approved through your insurance with a co-pay of $1.25. Your rheumatologist's office has submitted the paperwork for this: Medication [place] submitted a Prior Authorization request for Mycophenolate to [employer] on 07/05/2016. Prior Authorization was approved from 07/05/2016 to 05/09/2017. PA # [id number]. Ran test claim, pt's co-pay is $1.25. - Continue taking prednisone as directed - If your symptoms return seek medical attention right away, otherwise follow up as scheduled.     Allergies as of 07/16/2016   No Known Allergies     Medication List    TAKE these medications   cholecalciferol 1000 units tablet Commonly known as:  VITAMIN D Take 1,000 Units by mouth daily.   felodipine 5 MG 24 hr tablet Commonly known as:  PLENDIL Take 2 tablets (10 mg total) by mouth daily.   guaifenesin 400 MG Tabs tablet Commonly known as:  HUMIBID E Take 400 mg by mouth 2 (two) times daily.   hydrALAZINE 25 MG tablet Commonly known as:  APRESOLINE Take 1 tablet (25 mg total) by mouth every 8 (eight) hours.   hydroxychloroquine 200 MG tablet Commonly known as:  PLAQUENIL Take 1.5 tablets (300 mg total) by mouth daily.   labetalol 200 MG tablet Commonly known as:  NORMODYNE Take 1 tablet (200 mg total) by mouth 2 (two) times daily.   mycophenolate 500 MG tablet Commonly known as:  CELLCEPT Take 2 tablets (1,000 mg total) by mouth 2 (two) times daily.   pantoprazole 40 MG tablet Commonly known as:  PROTONIX Take 40 mg by mouth 2 (two) times daily.   predniSONE 20 MG tablet Commonly known as:  DELTASONE Take 2  tablets (40 mg total) by mouth daily with breakfast. Take two tablets by mouth daily until rheumatology follow up.   sodium bicarbonate 650 MG tablet Take 1 tablet (650 mg total) by mouth daily.   vitamin C 500 MG tablet Commonly known as:  ASCORBIC ACID Take 500 mg by mouth daily.      Follow-up Information    Sherril Croon, MD Follow up on 07/29/2016.   Specialty:  Nephrology Why:  11:00am Contact information: [place] [address] [phone]        Starr Lake, MD Follow up on 08/13/2016.   Specialty:  Rheumatology Why:  at 2:30pm with Dr. Karin Lieu information: [place] [id number] [phone]          No Known Allergies  Consultations:  PCCM  Nephrology.   IR  GI, Dr. Oletta Lamas  Procedures/Studies: Ct Abdomen Pelvis Wo Contrast  Result Date: 07/08/2016 CLINICAL DATA:  Abdominal pain.  Acute kidney injury. EXAM: CT ABDOMEN AND PELVIS WITHOUT CONTRAST TECHNIQUE: Multidetector CT imaging of the abdomen and pelvis was performed following the standard protocol without IV contrast. COMPARISON:  CT 2 weeks prior 06/27/2016 FINDINGS: Lower chest: Lung bases are clear. Heart is prominent size. No pleural fluid. Hepatobiliary: Calcified gallstone without abnormal gallbladder distention. No evidence of pericholecystic inflammation. No focal hepatic lesion allowing for noncontrast exam. Pancreas: Suboptimally assessed due to paucity of body fat and lack of intravenous contrast, no gross inflammation or ductal dilatation. Spleen:  Normal in size. Adrenals/Urinary Tract: No  evidence of adrenal nodule. No hydronephrosis. Probable right lower pole renal scarring. Increased density of the renal medulla on the left suggesting nephrocalcinosis. No perinephric edema. Ureters are decompressed and not well evaluated. Urinary bladder is physiologically distended without stone. Stomach/Bowel: Gastric wall thickening about the cardia. More  distal stomach is normal. Intraluminal low density in the second and third portion of the duodenum is likely secondary to ingested material versus focal wall abnormality. No evidence of bowel obstruction, enteric contrast reaches the ileal bowel loops. Appendix contains intraluminal high-density, may be from prior enteric contrast administration. No evidence of colonic wall thickening or inflammation. Vascular/Lymphatic: Aortic atherosclerosis without aneurysm. No bulky adenopathy within limitations of noncontrast exam and paucity of intra-abdominal fat. Reproductive: Right adnexal cyst measures 2.8 cm with peripheral calcification, unchanged from prior exam allowing for differences in caliper placement. Uterus is quiescent. Left ovary not confidently identified. Other: Small fat containing umbilical hernia without bowel involvement. No evidence of inflammation. Minimal mesenteric edema and trace fluid adjacent to the inferior liver tip and in the pelvis. No free air. Musculoskeletal: There are no acute or suspicious osseous abnormalities. The bones are under mineralized. IMPRESSION: 1. Gastric wall thickening about the cardia, nonspecific may be infectious or inflammatory. Otherwise no acute abnormality. 2. Stable chronic findings of cholelithiasis, fat containing umbilical hernia, left renal nephrocalcinosis, aortic atherosclerosis and peripherally calcified right adnexal cyst. Electronically Signed   By: Jeb Levering M.D.   On: 07/08/2016 18:59   Ct Abdomen Pelvis Wo Contrast  Result Date: 06/27/2016 CLINICAL DATA:  Diffuse abdominal pain with persistent vomiting EXAM: CT ABDOMEN AND PELVIS WITHOUT CONTRAST TECHNIQUE: Multidetector CT imaging of the abdomen and pelvis was performed following the standard protocol without IV contrast. COMPARISON:  None. FINDINGS: Lower chest: Bleb in the right lower lobe. No acute consolidation or effusion. Heart size appears slightly enlarged. Hepatobiliary: No focal  hepatic abnormality. Calcified stones in the gallbladder. No wall thickening. No biliary dilatation. Pancreas: Unremarkable. No pancreatic ductal dilatation or surrounding inflammatory changes. Spleen: Normal in size without focal abnormality. Adrenals/Urinary Tract: Adrenal glands are within normal limits. No hydronephrosis. Probable areas of cortical scarring in the right kidney. Hyperdensities in the left kidney, some of which appear to be within the medullary portion, suggestive of nephrocalcinosis. Others may represent parenchymal calcifications within the left kidney. No ureteral stones. Bladder normal Stomach/Bowel: Stomach is nonenlarged. No dilated small bowel. No colon wall thickening. Increased density within the lumen of the appendix but no inflammatory changes. Vascular/Lymphatic: Calcified aorta. No grossly enlarged lymph nodes Reproductive: Uterus grossly unremarkable. 2.6 cm rim calcified cyst in the right ovary. Other: No free air or free fluid.  Fat containing umbilical hernia. Musculoskeletal: No acute or suspicious bone lesions. IMPRESSION: 1. No CT evidence for a bowel obstruction. Appendicolith but no evidence for appendicitis. 2. 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 Others may represent parenchymal calcifications. Suspect cortical scarring in the lower pole of the right kidney. 3. 2.6 cm rim calcified cyst in the right ovary ; ultrasound may be obtained for further evaluation. Electronically Signed   By: Donavan Foil M.D.   On: 06/27/2016 23:45   Dg Chest 2 View  Result Date: 07/08/2016 CLINICAL DATA:  Cough EXAM: CHEST  2 VIEW COMPARISON:  None. FINDINGS: Heart is upper limits normal in size. Lungs are clear. No effusions. No acute bony abnormality. IMPRESSION: No active cardiopulmonary disease. Electronically Signed   By: Rolm Baptise M.D.  On: 07/08/2016 16:57   US Biopsy  Result Date: 07/12/2016 INDICATION:  56 year old female with renal disease, referred for biopsy EXAM: ULTRASOUND BIOPSY CORE LIVER MEDICATIONS: None. ANESTHESIA/SEDATION: Moderate (conscious) sedation was employed during this procedure. A total of Versed 2.0 mg and Fentanyl 50 mcg was administered intravenously. Moderate Sedation Time: 15 minutes. The patient's level of consciousness and vital signs were monitored continuously by radiology nursing throughout the procedure under my direct supervision. FLUOROSCOPY TIME:  None COMPLICATIONS: None PROCEDURE: Informed written consent was obtained from the patient after a thorough discussion of the procedural risks, benefits and alternatives. All questions were addressed. Maximal Sterile Barrier Technique was utilized including caps, mask, sterile gowns, sterile gloves, sterile drape, hand hygiene and skin antiseptic. A timeout was performed prior to the initiation of the procedure. Patient was positioned prone position on the gantry table. Images were stored sent to PACs. Once the patient is prepped and draped in the usual sterile fashion, the skin and subcutaneous tissues overlying the left kidney were generously infiltrated 1% lidocaine for local anesthesia. Using ultrasound guidance, a 15 gauge guide needle was advanced into the lower cortex of the left kidney. Once we confirmed location of the needle tip, 2 separate 16 gauge core biopsy were achieved. Two Gel-Foam pledgets were infused with a small amount of saline. The needle was removed. Final images were stored. The patient tolerated the procedure well and remained hemodynamically stable throughout. No complications were encountered and no significant blood loss encountered. IMPRESSION: Status post ultrasound-guided left sided medical renal biopsy. Signed, Dulcy Fanny. Earleen Newport, DO [facility] Electronically Signed   By: Corrie Mckusick D.O.   On: 07/12/2016 14:04   EGD 07/15/2016 Impression:        - Normal examined duodenum.                           - No specimens collected.                           - Abnormal Imaging normal endoscopic exam  Subjective: Patient has been eating more with the encouragement of her friend at the bedside. Walked without issues with PT today, up and down several flights of stairs. Had a loose BM today (has been receiving senna BID for chronic constipation). No abd pain, N/V, dyspnea, chest pain, fever.   Discharge Exam: Vitals:   07/16/16 1416 07/16/16 1421  BP: 124/83 126/83  Pulse:  72  Resp:    Temp:     General: Chronically ill-appearing 56yo female appearing more alert today, in no distress Cardiovascular: RRR, S1/S2 +, no rubs, no gallops Respiratory: Nonlabored while walking without supplemental oxygen. CTAB, no wheezing, no rhonchi Abdominal: Soft, ND, bowel sounds +. Umbilical tenderness and very small reducible hernia. No rebound.  Extremities: No edema, no cyanosis  The results of significant diagnostics from this hospitalization (including imaging, microbiology, ancillary and laboratory) are listed below for reference.    Labs: Basic Metabolic Panel:  Recent Labs Lab 07/10/16 0914 07/13/16 0600 07/14/16 0714 07/15/16 0940 07/16/16 0551  NA 133* 136 135 135 135  K 3.7 4.4 4.2 4.1 4.2  CL 103 108 104 108 108  CO2 21* 19* 18* 17* 17*  GLUCOSE 132* 85 79 83 80  BUN 43* 31* 31* 31* 31*  CREATININE 3.39* 3.10* 3.25* 3.25* 3.27*  CALCIUM 8.8* 9.0 9.3 9.1 9.2  PHOS  --  4.1 3.6 3.1  --    Liver Function Tests:  Recent Labs Lab 07/13/16 0600 07/14/16 0714 07/15/16 0940  ALBUMIN 2.9* 2.9* 3.2*   CBC:  Recent Labs Lab 07/10/16 0914 07/12/16 0522 07/16/16 0551  WBC 13.4* 8.6 6.7  HGB 8.1* 7.7* 7.8*  HCT 24.8* 23.6* 24.4*  MCV 84.1 84.3 85.9  PLT 325 301 337   Urinalysis    Component Value Date/Time   COLORURINE COLORLESS (A) 07/08/2016 1715   APPEARANCEUR CLEAR 07/08/2016 1715   LABSPEC 1.005 07/08/2016 1715    PHURINE 7.0 07/08/2016 1715   GLUCOSEU NEGATIVE 07/08/2016 1715   HGBUR SMALL (A) 07/08/2016 1715   BILIRUBINUR NEGATIVE 07/08/2016 [place] 07/08/2016 1715   PROTEINUR 100 (A) 07/08/2016 1715   NITRITE NEGATIVE 07/08/2016 1715   LEUKOCYTESUR NEGATIVE 07/08/2016 1715    Microbiology Recent Results (from the past 240 hour(s))  Blood Culture (routine x 2)     Status: None   Collection Time: 07/08/16  2:30 PM  Result Value Ref Range Status   Specimen Description BLOOD LEFT ANTECUBITAL  Final   Special Requests BOTTLES DRAWN AEROBIC AND ANAEROBIC 5CC  Final   Culture NO GROWTH 5 DAYS  Final   Report Status 07/13/2016 FINAL  Final  Blood Culture (routine x 2)     Status: None   Collection Time: 07/08/16  2:49 PM  Result Value Ref Range Status   Specimen Description BLOOD RIGHT ANTECUBITAL  Final   Special Requests BOTTLES DRAWN AEROBIC AND ANAEROBIC 10CC  Final   Culture NO GROWTH 5 DAYS  Final   Report Status 07/13/2016 FINAL  Final  MRSA PCR Screening     Status: None   Collection Time: 07/08/16 10:51 PM  Result Value Ref Range Status   MRSA by PCR NEGATIVE NEGATIVE Final    Comment:        The GeneXpert MRSA Assay (FDA approved for NASAL specimens only), is one component of a comprehensive MRSA colonization surveillance program. It is not intended to diagnose MRSA infection nor to guide or monitor treatment for MRSA infections.     Time coordinating discharge: Approximately 40 minutes  Vance Gather, MD  [facility] 07/16/2016, 3:26 PM Pager [phone]

## 2016-07-16 NOTE — Plan of Care (Signed)
Problem: Education: Goal: Knowledge of [facility] Education information/materials will improve Outcome: Progressing POC reviewed with pt.   

## 2016-07-16 NOTE — Plan of Care (Signed)
Problem: Activity: Goal: Risk for activity intolerance will decrease Outcome: Progressing Pt compliant with PT and states she would like to "get up and walk:"

## 2016-07-16 NOTE — Evaluation (Signed)
Physical Therapy Evaluation Patient Details Name: Kathleen [name] MRN: [id number] DOB: 07-23-1960 Today's Date: 07/16/2016   History of Present Illness  Pt with medical history of significant forhypertension, lupus, and GERD. Pt went to ED for generalized weakness, nausea and abdominal pain. Underwent upper GI endoscopy on 07/15/16.   Clinical Impression  Pt is at or close to baseline functioning and should be safe at home with available assist. There are no further acute PT needs.  Will sign off at this time.     Follow Up Recommendations No PT follow up    Equipment Recommendations  None recommended by PT    Recommendations for Other Services       Precautions / Restrictions Precautions Precautions: Fall Restrictions Weight Bearing Restrictions: No      Mobility  Bed Mobility Overal bed mobility: Independent                Transfers Overall transfer level: Independent Equipment used: None             General transfer comment: Performs all transfers with supervision  Ambulation/Gait Ambulation/Gait assistance: Independent Ambulation Distance (Feet): 400 Feet Assistive device: None Gait Pattern/deviations: Step-through pattern   Gait velocity interpretation: at or above normal speed for age/gender General Gait Details: generally steady see DGI  Stairs Stairs: Yes Stairs assistance: Modified independent (Device/Increase time) Stair Management: One rail Left;Alternating pattern;Forwards Number of Stairs: 3 (limited by IV) General stair comments: safe with rail  Wheelchair Mobility    Modified Rankin (Stroke Patients Only)       Balance Overall balance assessment: No apparent balance deficits (not formally assessed)                               Standardized Balance Assessment Standardized Balance Assessment : Dynamic Gait Index   Dynamic Gait Index Level Surface: Normal Change in Gait Speed: Normal Gait with Horizontal Head  Turns: Normal Gait with Vertical Head Turns: Mild Impairment Gait and Pivot Turn: Normal Step Over Obstacle: Normal Step Around Obstacles: Normal Steps: Mild Impairment Total Score: 22       Pertinent Vitals/Pain Pain Assessment: Faces Pain Score: 6  Faces Pain Scale: Hurts little more Pain Location: Abdomen/stomach Pain Descriptors / Indicators: Constant;Aching;Guarding Pain Intervention(s): Monitored during session    Home Living Family/patient expects to be discharged to:: Private residence Living Arrangements: Spouse/significant other Available Help at Discharge: Other (Comment) Type of Home: Apartment Home Access: Level entry     Home Layout: Other (Comment) Home Equipment: None      Prior Function Level of Independence: Independent         Comments: "I have good days and bad days"  PTA, pt performed ADLs, IADLs, and driving     Hand Dominance        Extremity/Trunk Assessment   Upper Extremity Assessment Upper Extremity Assessment: Overall WFL for tasks assessed    Lower Extremity Assessment Lower Extremity Assessment: Overall WFL for tasks assessed (mild proximal weakness)    Cervical / Trunk Assessment Cervical / Trunk Assessment: Normal  Communication   Communication: No difficulties  Cognition Arousal/Alertness: Awake/alert Behavior During Therapy: WFL for tasks assessed/performed Overall Cognitive Status: Within Functional Limits for tasks assessed                 General Comments: easily distracted, but redirectable easily    General Comments      Exercises     Assessment/Plan  PT Assessment Patient needs continued PT services  PT Problem List         PT Treatment Interventions      PT Goals (Current goals can be found in the Care Plan section)  Acute Rehab PT Goals Patient Stated Goal: Go home and eat PT Goal Formulation: With patient    Frequency     Barriers to discharge        Co-evaluation                End of Session   Activity Tolerance: Patient tolerated treatment well Patient left: in bed;with call bell/phone within reach;Other (comment) (at EOB) Nurse Communication: Mobility status PT Visit Diagnosis: Unsteadiness on feet (R26.81)         Time: 4098-1191 PT Time Calculation (min) (ACUTE ONLY): 22 min   Charges:   PT Evaluation $PT Eval Moderate Complexity: 1 Procedure     PT G CodesEliseo Gum Rolando Whitby 07/16/2016, 10:53 AM  07/16/2016  [place]ton Bing, PT [phone]  (pager)

## 2016-07-16 NOTE — Evaluation (Signed)
Occupational Therapy Evaluation Patient Details Name: Kathleen [name] MRN: [id number] DOB: 02-21-1961 Today's Date: 07/16/2016    History of Present Illness Pt with medical history of significant forhypertension, lupus, and GERD. Pt went to ED for generalized weakness, nauseam and abdominal pain. Underwent upper GI endoscopy on 07/15/16.    Clinical Impression   PTA, pt was living with her fiance and was independent with ADLs and IADLs stating that "I have my good days and my bad days. Some days I just get dizzy". Currently, pt presents with near baseline function and performed ADLs and functional mobility with supervision. Pt currently has no acute OT needs. Recommend dc home once medically stable per physician.      Follow Up Recommendations  No OT follow up;Supervision - Intermittent    Equipment Recommendations  None recommended by OT    Recommendations for Other Services       Precautions / Restrictions Precautions Precautions: Fall Restrictions Weight Bearing Restrictions: No      Mobility Bed Mobility Overal bed mobility: Independent                Transfers Overall transfer level: Independent Equipment used: None             General transfer comment: Performs all transfers with supervision    Balance Overall balance assessment: No apparent balance deficits (not formally assessed)                                          ADL Overall ADL's : Modified independent;At baseline Eating/Feeding: Set up;Sitting   Grooming: Wash/dry hands;Wash/dry face;Oral care;Applying deodorant;Supervision/safety;Set up;Standing   Upper Body Bathing: Supervision/ safety;Standing   Lower Body Bathing: Supervison/ safety;Sit to/from stand   Upper Body Dressing : Supervision/safety;Standing Upper Body Dressing Details (indicate cue type and reason): Donned new gown at sink Lower Body Dressing: Supervision/safety;Sit to/from stand (Able tp reach down to  don/doff socks)   Toilet Transfer: Supervision/safety;Ambulation;Regular Museum/gallery exhibitions officer and Hygiene: Supervision/safety       Functional mobility during ADLs: Supervision/safety General ADL Comments: Pt demonstrates near baselien function     Vision         Perception     Praxis      Pertinent Vitals/Pain Pain Assessment: 0-10 Pain Score: 6  Pain Location: Abdomen/stomach Pain Descriptors / Indicators: Constant;Aching;Guarding     Hand Dominance     Extremity/Trunk Assessment Upper Extremity Assessment Upper Extremity Assessment: Overall WFL for tasks assessed   Lower Extremity Assessment Lower Extremity Assessment: Overall WFL for tasks assessed   Cervical / Trunk Assessment Cervical / Trunk Assessment: Normal   Communication Communication Communication: No difficulties   Cognition Arousal/Alertness: Awake/alert Behavior During Therapy: WFL for tasks assessed/performed Overall Cognitive Status: Within Functional Limits for tasks assessed                 General Comments: able to perform ADLs with distraction. Remembered three words. Followed simple commands   General Comments       Exercises       Shoulder Instructions      Home Living Family/patient expects to be discharged to:: Private residence Living Arrangements: Spouse/significant other Available Help at Discharge: Other (Comment) (fiance) Type of Home: Apartment Home Access: Level entry     Home Layout: Other (Comment) (1st floor)     Bathroom Shower/Tub: Tub/shower unit;Curtain Shower/tub characteristics: Theatre stage manager  Toilet: Standard                Prior Functioning/Environment Level of Independence: Independent        Comments: "I have good days and bad days"  PTA, pt performed ADLs, IADLs, and driving        OT Problem List: Decreased activity tolerance;Impaired balance (sitting and/or standing);Decreased safety awareness;Pain       OT Treatment/Interventions:      OT Goals(Current goals can be found in the care plan section) Acute Rehab OT Goals Patient Stated Goal: Go home and eat OT Goal Formulation: With patient Time For Goal Achievement: 07/30/16 Potential to Achieve Goals: Good  OT Frequency:     Barriers to D/C:            Co-evaluation              End of Session Equipment Utilized During Treatment: Gait belt Nurse Communication: Mobility status  Activity Tolerance: Patient tolerated treatment well Patient left: in bed;with call bell/phone within reach;with nursing/sitter in room                   ADL either performed or assessed with clinical judgement  Time: 0911-0943 OT Time Calculation (min): 32 min Charges:  OT General Charges $OT Visit: 1 Procedure OT Evaluation $OT Eval Low Complexity: 1 Procedure OT Treatments $Self Care/Home Management : 8-22 mins G-Codes:     [employer], OTR/L [place] 07/16/2016, 9:56 AM

## 2016-07-16 NOTE — Progress Notes (Signed)
Kathleen [name] to be D/C'd Home per MD order.  Discussed prescriptions and follow up appointments with the patient. Prescriptions given to patient, medication list explained in detail. Pt verbalized understanding.  Allergies as of 07/16/2016   No Known Allergies     Medication List    TAKE these medications   cholecalciferol 1000 units tablet Commonly known as:  VITAMIN D Take 1,000 Units by mouth daily.   felodipine 5 MG 24 hr tablet Commonly known as:  PLENDIL Take 2 tablets (10 mg total) by mouth daily.   guaifenesin 400 MG Tabs tablet Commonly known as:  HUMIBID E Take 400 mg by mouth 2 (two) times daily.   hydrALAZINE 25 MG tablet Commonly known as:  APRESOLINE Take 1 tablet (25 mg total) by mouth every 8 (eight) hours. Notes to patient:  Remember to take your blood pressure before taking medication    hydroxychloroquine 200 MG tablet Commonly known as:  PLAQUENIL Take 1.5 tablets (300 mg total) by mouth daily.   labetalol 200 MG tablet Commonly known as:  NORMODYNE Take 1 tablet (200 mg total) by mouth 2 (two) times daily. Notes to patient:  Take next dose tonight. Remember to take pulse before taking medication.    mycophenolate 500 MG tablet Commonly known as:  CELLCEPT Take 2 tablets (1,000 mg total) by mouth 2 (two) times daily.   pantoprazole 40 MG tablet Commonly known as:  PROTONIX Take 40 mg by mouth 2 (two) times daily.   predniSONE 20 MG tablet Commonly known as:  DELTASONE Take 2 tablets (40 mg total) by mouth daily with breakfast. Take two tablets by mouth daily until rheumatology follow up.   sodium bicarbonate 650 MG tablet Take 1 tablet (650 mg total) by mouth daily.   vitamin C 500 MG tablet Commonly known as:  ASCORBIC ACID Take 500 mg by mouth daily.       Vitals:   07/16/16 1416 07/16/16 1421  BP: 124/83 126/83  Pulse:  72  Resp:    Temp:      Skin clean, dry and intact without evidence of skin break down, no evidence of skin  tears noted. IV catheter discontinued intact. Site without signs and symptoms of complications. Dressing and pressure applied. Pt denies pain at this time. No complaints noted.  An After Visit Summary was printed and given to the patient. Patient escorted via [place], and D/C home via private auto.  Emilio Math, RN [facility] Phone [phone]

## 2016-07-16 NOTE — Progress Notes (Signed)
S: No new CO O:BP (!) 142/85 (BP Location: Left Arm)   Pulse 73   Temp 98.6 F (37 C) (Oral)   Resp 19   Ht 5\' 4"  (1.626 m)   Wt 50.2 kg (110 lb 10.7 oz)   SpO2 100%   BMI 19.00 kg/m   Intake/Output Summary (Last 24 hours) at 07/16/16 0935 Last data filed at 07/16/16 0109  Gross per 24 hour  Intake             1395 ml  Output              300 ml  Net             1095 ml   Weight change: 0.123 kg (4.3 oz) NAT:FTDDU and alert CVS: RRR Resp:clear Abd:+ BS, soft, NTND Ext: no edema NEURO:CNI Ox3 no asterixis   . amLODipine  10 mg Oral Daily  . feeding supplement (ENSURE ENLIVE)  237 mL Oral BID BM  . heparin subcutaneous  5,000 Units Subcutaneous Q8H  . hydrALAZINE  25 mg Oral Q8H  . hydroxychloroquine  300 mg Oral Daily  . labetalol  200 mg Oral BID  . mycophenolate  1,000 mg Oral BID  . pantoprazole  40 mg Oral Daily  . predniSONE  50 mg Oral Q breakfast  . senna-docusate  2 tablet Oral BID   No results found. BMET    Component Value Date/Time   NA 135 07/16/2016 0551   K 4.2 07/16/2016 0551   CL 108 07/16/2016 0551   CO2 17 (L) 07/16/2016 0551   GLUCOSE 80 07/16/2016 0551   BUN 31 (H) 07/16/2016 0551   CREATININE 3.27 (H) 07/16/2016 0551   CALCIUM 9.2 07/16/2016 0551   GFRNONAA 15 (L) 07/16/2016 0551   GFRAA 17 (L) 07/16/2016 0551   CBC    Component Value Date/Time   WBC 6.7 07/16/2016 0551   RBC 2.84 (L) 07/16/2016 0551   HGB 7.8 (L) 07/16/2016 0551   HCT 24.4 (L) 07/16/2016 0551   PLT 337 07/16/2016 0551   MCV 85.9 07/16/2016 0551   MCH 27.5 07/16/2016 0551   MCHC 32.0 07/16/2016 0551   RDW 17.3 (H) 07/16/2016 0551   LYMPHSABS 1.1 07/08/2016 1437   MONOABS 1.2 (H) 07/08/2016 1437   EOSABS 0.1 07/08/2016 1437   BASOSABS 0.0 07/08/2016 1437     Assessment: 1. ? 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  Bx just showed scarring and probably result of sampling error 2. Anemia 3. SLE 4. HTN urgency,  BP under better control 5. Dysphasia, EGD negative  Plan: 1. She can go from renal standpoint and FU as noted below 2. Cont pred and cellcept. 3. She needs FU appt with her rheumatologist at [place]. 4. She has FU appt with Dr Justin Mend 07/29/16 11AM [place] Vannatter T

## 2016-07-18 ENCOUNTER — Encounter (HOSPITAL_COMMUNITY): Payer: Self-pay | Admitting: Gastroenterology

## 2016-07-20 ENCOUNTER — Encounter (HOSPITAL_COMMUNITY): Payer: Self-pay

## 2016-08-04 ENCOUNTER — Encounter (HOSPITAL_COMMUNITY): Payer: Self-pay

## 2016-09-01 ENCOUNTER — Other Ambulatory Visit: Payer: Self-pay | Admitting: Vascular Surgery

## 2016-09-01 DIAGNOSIS — N185 Chronic kidney disease, stage 5: Secondary | ICD-10-CM

## 2016-09-01 DIAGNOSIS — Z01810 Encounter for preprocedural cardiovascular examination: Secondary | ICD-10-CM

## 2016-09-29 ENCOUNTER — Other Ambulatory Visit (HOSPITAL_COMMUNITY): Payer: Self-pay | Admitting: *Deleted

## 2016-09-30 ENCOUNTER — Ambulatory Visit (HOSPITAL_COMMUNITY): Admission: RE | Admit: 2016-09-30 | Payer: Medicare Other | Source: Ambulatory Visit

## 2016-10-08 NOTE — Addendum Note (Signed)
Addendum  created 10/08/16 1105 by Effie Berkshire, MD   Sign clinical note

## 2016-10-11 ENCOUNTER — Encounter: Payer: Self-pay | Admitting: Vascular Surgery

## 2016-10-13 ENCOUNTER — Encounter (HOSPITAL_COMMUNITY): Payer: Medicare Other

## 2016-10-13 ENCOUNTER — Other Ambulatory Visit (HOSPITAL_COMMUNITY): Payer: Medicare Other

## 2016-10-13 ENCOUNTER — Encounter: Payer: Medicare Other | Admitting: Vascular Surgery

## 2016-11-30 DIAGNOSIS — F17200 Nicotine dependence, unspecified, uncomplicated: Secondary | ICD-10-CM | POA: Insufficient documentation

## 2016-11-30 DIAGNOSIS — I739 Peripheral vascular disease, unspecified: Secondary | ICD-10-CM | POA: Insufficient documentation

## 2017-04-04 DIAGNOSIS — M25551 Pain in right hip: Secondary | ICD-10-CM | POA: Insufficient documentation

## 2017-04-04 DIAGNOSIS — M25552 Pain in left hip: Secondary | ICD-10-CM | POA: Insufficient documentation

## 2017-06-29 ENCOUNTER — Emergency Department (HOSPITAL_COMMUNITY): Payer: Medicare Other

## 2017-06-29 ENCOUNTER — Inpatient Hospital Stay (HOSPITAL_COMMUNITY)
Admission: EM | Admit: 2017-06-29 | Discharge: 2017-07-07 | DRG: 264 | Disposition: A | Discharge status: Home / Self Care | Payer: Medicare Other | Attending: Internal Medicine | Admitting: Internal Medicine

## 2017-06-29 ENCOUNTER — Encounter (HOSPITAL_COMMUNITY): Payer: Self-pay | Admitting: *Deleted

## 2017-06-29 DIAGNOSIS — I169 Hypertensive crisis, unspecified: Secondary | ICD-10-CM | POA: Diagnosis present

## 2017-06-29 DIAGNOSIS — M1990 Unspecified osteoarthritis, unspecified site: Secondary | ICD-10-CM | POA: Diagnosis present

## 2017-06-29 DIAGNOSIS — I10 Essential (primary) hypertension: Secondary | ICD-10-CM | POA: Diagnosis not present

## 2017-06-29 DIAGNOSIS — Z95828 Presence of other vascular implants and grafts: Secondary | ICD-10-CM

## 2017-06-29 DIAGNOSIS — R0600 Dyspnea, unspecified: Secondary | ICD-10-CM | POA: Diagnosis not present

## 2017-06-29 DIAGNOSIS — Z8679 Personal history of other diseases of the circulatory system: Secondary | ICD-10-CM

## 2017-06-29 DIAGNOSIS — I132 Hypertensive heart and chronic kidney disease with heart failure and with stage 5 chronic kidney disease, or end stage renal disease: Secondary | ICD-10-CM | POA: Diagnosis present

## 2017-06-29 DIAGNOSIS — I351 Nonrheumatic aortic (valve) insufficiency: Secondary | ICD-10-CM | POA: Diagnosis not present

## 2017-06-29 DIAGNOSIS — N186 End stage renal disease: Secondary | ICD-10-CM | POA: Diagnosis present

## 2017-06-29 DIAGNOSIS — I161 Hypertensive emergency: Secondary | ICD-10-CM | POA: Diagnosis present

## 2017-06-29 DIAGNOSIS — N179 Acute kidney failure, unspecified: Secondary | ICD-10-CM | POA: Diagnosis present

## 2017-06-29 DIAGNOSIS — E872 Acidosis: Secondary | ICD-10-CM | POA: Diagnosis present

## 2017-06-29 DIAGNOSIS — Z8674 Personal history of sudden cardiac arrest: Secondary | ICD-10-CM

## 2017-06-29 DIAGNOSIS — I509 Heart failure, unspecified: Secondary | ICD-10-CM

## 2017-06-29 DIAGNOSIS — Z9189 Other specified personal risk factors, not elsewhere classified: Secondary | ICD-10-CM

## 2017-06-29 DIAGNOSIS — I252 Old myocardial infarction: Secondary | ICD-10-CM | POA: Diagnosis not present

## 2017-06-29 DIAGNOSIS — F17210 Nicotine dependence, cigarettes, uncomplicated: Secondary | ICD-10-CM | POA: Diagnosis present

## 2017-06-29 DIAGNOSIS — I5031 Acute diastolic (congestive) heart failure: Secondary | ICD-10-CM | POA: Diagnosis present

## 2017-06-29 DIAGNOSIS — J449 Chronic obstructive pulmonary disease, unspecified: Secondary | ICD-10-CM | POA: Diagnosis present

## 2017-06-29 DIAGNOSIS — Z8659 Personal history of other mental and behavioral disorders: Secondary | ICD-10-CM | POA: Diagnosis not present

## 2017-06-29 DIAGNOSIS — I739 Peripheral vascular disease, unspecified: Secondary | ICD-10-CM | POA: Diagnosis present

## 2017-06-29 DIAGNOSIS — R402413 Glasgow coma scale score 13-15, at hospital admission: Secondary | ICD-10-CM | POA: Diagnosis present

## 2017-06-29 DIAGNOSIS — Z7952 Long term (current) use of systemic steroids: Secondary | ICD-10-CM

## 2017-06-29 DIAGNOSIS — Z8673 Personal history of transient ischemic attack (TIA), and cerebral infarction without residual deficits: Secondary | ICD-10-CM | POA: Diagnosis not present

## 2017-06-29 DIAGNOSIS — Z992 Dependence on renal dialysis: Secondary | ICD-10-CM | POA: Diagnosis not present

## 2017-06-29 DIAGNOSIS — I871 Compression of vein: Secondary | ICD-10-CM | POA: Diagnosis present

## 2017-06-29 DIAGNOSIS — J9601 Acute respiratory failure with hypoxia: Secondary | ICD-10-CM | POA: Diagnosis present

## 2017-06-29 DIAGNOSIS — D631 Anemia in chronic kidney disease: Secondary | ICD-10-CM | POA: Diagnosis present

## 2017-06-29 DIAGNOSIS — Z79899 Other long term (current) drug therapy: Secondary | ICD-10-CM

## 2017-06-29 DIAGNOSIS — M3214 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: Secondary | ICD-10-CM | POA: Diagnosis present

## 2017-06-29 DIAGNOSIS — J208 Acute bronchitis due to other specified organisms: Secondary | ICD-10-CM | POA: Diagnosis present

## 2017-06-29 DIAGNOSIS — R64 Cachexia: Secondary | ICD-10-CM | POA: Diagnosis present

## 2017-06-29 DIAGNOSIS — N185 Chronic kidney disease, stage 5: Secondary | ICD-10-CM | POA: Diagnosis not present

## 2017-06-29 DIAGNOSIS — J810 Acute pulmonary edema: Secondary | ICD-10-CM | POA: Diagnosis not present

## 2017-06-29 DIAGNOSIS — Z8611 Personal history of tuberculosis: Secondary | ICD-10-CM

## 2017-06-29 LAB — CBC WITH DIFFERENTIAL/PLATELET
BASOS ABS: 0 10*3/uL (ref 0.0–0.1)
BASOS PCT: 1 %
Eosinophils Absolute: 0.1 10*3/uL (ref 0.0–0.7)
Eosinophils Relative: 2 %
HCT: 28.5 % — ABNORMAL LOW (ref 36.0–46.0)
Hemoglobin: 8.6 g/dL — ABNORMAL LOW (ref 12.0–15.0)
Lymphocytes Relative: 20 %
Lymphs Abs: 1.4 10*3/uL (ref 0.7–4.0)
MCH: 28.5 pg (ref 26.0–34.0)
MCHC: 30.2 g/dL (ref 30.0–36.0)
MCV: 94.4 fL (ref 78.0–100.0)
MONO ABS: 0.2 10*3/uL (ref 0.1–1.0)
MONOS PCT: 3 %
Neutro Abs: 5.1 10*3/uL (ref 1.7–7.7)
Neutrophils Relative %: 74 %
PLATELETS: 248 10*3/uL (ref 150–400)
RBC: 3.02 MIL/uL — ABNORMAL LOW (ref 3.87–5.11)
RDW: 14.7 % (ref 11.5–15.5)
WBC: 6.8 10*3/uL (ref 4.0–10.5)

## 2017-06-29 LAB — RESPIRATORY PANEL BY PCR
ADENOVIRUS-RVPPCR: NOT DETECTED
Bordetella pertussis: NOT DETECTED
CORONAVIRUS NL63-RVPPCR: NOT DETECTED
Chlamydophila pneumoniae: NOT DETECTED
Coronavirus 229E: NOT DETECTED
Coronavirus HKU1: NOT DETECTED
Coronavirus OC43: NOT DETECTED
INFLUENZA A-RVPPCR: NOT DETECTED
Influenza B: NOT DETECTED
Metapneumovirus: NOT DETECTED
Mycoplasma pneumoniae: NOT DETECTED
PARAINFLUENZA VIRUS 3-RVPPCR: NOT DETECTED
PARAINFLUENZA VIRUS 4-RVPPCR: NOT DETECTED
Parainfluenza Virus 1: NOT DETECTED
Parainfluenza Virus 2: NOT DETECTED
RESPIRATORY SYNCYTIAL VIRUS-RVPPCR: NOT DETECTED
RHINOVIRUS / ENTEROVIRUS - RVPPCR: NOT DETECTED

## 2017-06-29 LAB — COMPREHENSIVE METABOLIC PANEL
ALBUMIN: 3.6 g/dL (ref 3.5–5.0)
ALT: 30 U/L (ref 14–54)
ANION GAP: 12 (ref 5–15)
AST: 60 U/L — AB (ref 15–41)
Alkaline Phosphatase: 88 U/L (ref 38–126)
BUN: 61 mg/dL — AB (ref 6–20)
CHLORIDE: 110 mmol/L (ref 101–111)
CO2: 18 mmol/L — ABNORMAL LOW (ref 22–32)
Calcium: 8.7 mg/dL — ABNORMAL LOW (ref 8.9–10.3)
Creatinine, Ser: 7.21 mg/dL — ABNORMAL HIGH (ref 0.44–1.00)
GFR calc Af Amer: 7 mL/min — ABNORMAL LOW (ref >60)
GFR, EST NON AFRICAN AMERICAN: 6 mL/min — AB (ref >60)
Glucose, Bld: 102 mg/dL — ABNORMAL HIGH (ref 65–99)
Potassium: 5.3 mmol/L — ABNORMAL HIGH (ref 3.5–5.1)
Sodium: 140 mmol/L (ref 135–145)
TOTAL PROTEIN: 7.1 g/dL (ref 6.5–8.1)
Total Bilirubin: 0.8 mg/dL (ref 0.3–1.2)

## 2017-06-29 LAB — URINALYSIS, ROUTINE W REFLEX MICROSCOPIC
BACTERIA UA: NONE SEEN
Bilirubin Urine: NEGATIVE
Glucose, UA: NEGATIVE mg/dL
Ketones, ur: NEGATIVE mg/dL
Leukocytes, UA: NEGATIVE
NITRITE: NEGATIVE
PH: 6 (ref 5.0–8.0)
Protein, ur: 100 mg/dL — AB
SPECIFIC GRAVITY, URINE: 1.009 (ref 1.005–1.030)
SQUAMOUS EPITHELIAL / LPF: NONE SEEN

## 2017-06-29 LAB — I-STAT ARTERIAL BLOOD GAS, ED
Acid-base deficit: 9 mmol/L — ABNORMAL HIGH (ref 0.0–2.0)
Bicarbonate: 17.8 mmol/L — ABNORMAL LOW (ref 20.0–28.0)
O2 Saturation: 100 %
PCO2 ART: 40.2 mmHg (ref 32.0–48.0)
PH ART: 7.255 — AB (ref 7.350–7.450)
TCO2: 19 mmol/L — ABNORMAL LOW (ref 22–32)
pO2, Arterial: 210 mmHg — ABNORMAL HIGH (ref 83.0–108.0)

## 2017-06-29 LAB — RAPID URINE DRUG SCREEN, HOSP PERFORMED
AMPHETAMINES: NOT DETECTED
BARBITURATES: NOT DETECTED
Benzodiazepines: NOT DETECTED
COCAINE: NOT DETECTED
OPIATES: NOT DETECTED
TETRAHYDROCANNABINOL: NOT DETECTED

## 2017-06-29 LAB — INFLUENZA PANEL BY PCR (TYPE A & B)
INFLAPCR: NEGATIVE
Influenza B By PCR: NEGATIVE

## 2017-06-29 LAB — CREATININE, SERUM
Creatinine, Ser: 7.14 mg/dL — ABNORMAL HIGH (ref 0.44–1.00)
GFR calc Af Amer: 7 mL/min — ABNORMAL LOW (ref >60)
GFR calc non Af Amer: 6 mL/min — ABNORMAL LOW (ref >60)

## 2017-06-29 LAB — MRSA PCR SCREENING: MRSA by PCR: NEGATIVE

## 2017-06-29 LAB — I-STAT TROPONIN, ED: TROPONIN I, POC: 0.08 ng/mL (ref 0.00–0.08)

## 2017-06-29 LAB — BRAIN NATRIURETIC PEPTIDE: B NATRIURETIC PEPTIDE 5: 923.5 pg/mL — AB (ref 0.0–100.0)

## 2017-06-29 LAB — I-STAT CG4 LACTIC ACID, ED: LACTIC ACID, VENOUS: 1.47 mmol/L (ref 0.5–1.9)

## 2017-06-29 MED ORDER — ONDANSETRON HCL 4 MG/2ML IJ SOLN
4.0000 mg | Freq: Once | INTRAMUSCULAR | Status: DC
  Filled 2017-06-29: qty 2

## 2017-06-29 MED ORDER — FUROSEMIDE 10 MG/ML IJ SOLN
40.0000 mg | Freq: Once | INTRAMUSCULAR | Status: AC
  Administered 2017-06-29: 40 mg via INTRAVENOUS
  Filled 2017-06-29: qty 4

## 2017-06-29 MED ORDER — FUROSEMIDE 10 MG/ML IJ SOLN
160.0000 mg | Freq: Three times a day (TID) | INTRAVENOUS | Status: DC
  Administered 2017-06-29 – 2017-06-30 (×4): 160 mg via INTRAVENOUS
  Filled 2017-06-29: qty 10
  Filled 2017-06-29 (×4): qty 16
  Filled 2017-06-29: qty 10
  Filled 2017-06-29 (×2): qty 16

## 2017-06-29 MED ORDER — NITROGLYCERIN IN D5W 200-5 MCG/ML-% IV SOLN
0.0000 ug/min | INTRAVENOUS | Status: DC
  Administered 2017-06-29: 180 ug/min via INTRAVENOUS
  Administered 2017-06-29: 25 ug/min via INTRAVENOUS
  Administered 2017-06-30: 185 ug/min via INTRAVENOUS
  Filled 2017-06-29 (×5): qty 250

## 2017-06-29 MED ORDER — AZITHROMYCIN 250 MG PO TABS
500.0000 mg | ORAL_TABLET | Freq: Every day | ORAL | Status: AC
  Administered 2017-06-29 – 2017-07-02 (×4): 500 mg via ORAL
  Filled 2017-06-29 (×2): qty 2
  Filled 2017-06-29: qty 1
  Filled 2017-06-29: qty 2

## 2017-06-29 MED ORDER — SODIUM BICARBONATE 650 MG PO TABS
650.0000 mg | ORAL_TABLET | Freq: Three times a day (TID) | ORAL | Status: DC
  Filled 2017-06-29 (×2): qty 1

## 2017-06-29 MED ORDER — ALBUTEROL (5 MG/ML) CONTINUOUS INHALATION SOLN
15.0000 mg/h | INHALATION_SOLUTION | Freq: Once | RESPIRATORY_TRACT | Status: AC
  Administered 2017-06-29: 15 mg/h via RESPIRATORY_TRACT
  Filled 2017-06-29: qty 20

## 2017-06-29 MED ORDER — LABETALOL HCL 100 MG PO TABS
100.0000 mg | ORAL_TABLET | Freq: Two times a day (BID) | ORAL | Status: DC
  Administered 2017-06-29 (×2): 100 mg via ORAL
  Filled 2017-06-29 (×3): qty 1

## 2017-06-29 MED ORDER — IPRATROPIUM-ALBUTEROL 0.5-2.5 (3) MG/3ML IN SOLN
3.0000 mL | Freq: Four times a day (QID) | RESPIRATORY_TRACT | Status: DC
  Administered 2017-06-29 – 2017-06-30 (×4): 3 mL via RESPIRATORY_TRACT
  Filled 2017-06-29 (×6): qty 3

## 2017-06-29 MED ORDER — MAGNESIUM SULFATE 2 GM/50ML IV SOLN
2.0000 g | Freq: Once | INTRAVENOUS | Status: AC
  Administered 2017-06-29: 2 g via INTRAVENOUS
  Filled 2017-06-29: qty 50

## 2017-06-29 MED ORDER — FUROSEMIDE 10 MG/ML IJ SOLN
80.0000 mg | Freq: Every day | INTRAMUSCULAR | Status: DC
Start: 2017-06-30 — End: 2017-06-29

## 2017-06-29 MED ORDER — HEPARIN SODIUM (PORCINE) 5000 UNIT/ML IJ SOLN
5000.0000 [IU] | Freq: Three times a day (TID) | INTRAMUSCULAR | Status: DC
  Administered 2017-06-29 – 2017-07-03 (×12): 5000 [IU] via SUBCUTANEOUS
  Filled 2017-06-29 (×13): qty 1

## 2017-06-29 MED ORDER — SODIUM CHLORIDE 0.9 % IV SOLN: 250.0000 mL | INTRAVENOUS | Status: DC | PRN

## 2017-06-29 MED ORDER — PANTOPRAZOLE SODIUM 40 MG PO TBEC
40.0000 mg | DELAYED_RELEASE_TABLET | Freq: Every day | ORAL | Status: DC
  Administered 2017-06-29: 40 mg via ORAL
  Filled 2017-06-29: qty 1

## 2017-06-29 MED ORDER — IPRATROPIUM BROMIDE 0.02 % IN SOLN
0.5000 mg | Freq: Once | RESPIRATORY_TRACT | Status: AC
  Administered 2017-06-29: 0.5 mg via RESPIRATORY_TRACT
  Filled 2017-06-29: qty 2.5

## 2017-06-29 MED ORDER — LABETALOL HCL 5 MG/ML IV SOLN
10.0000 mg | Freq: Once | INTRAVENOUS | Status: AC
  Administered 2017-06-29: 10 mg via INTRAVENOUS
  Filled 2017-06-29: qty 4

## 2017-06-29 MED ORDER — NITROGLYCERIN IN D5W 200-5 MCG/ML-% IV SOLN
0.0000 ug/min | Freq: Once | INTRAVENOUS | Status: AC
  Administered 2017-06-29: 5 ug/min via INTRAVENOUS
  Filled 2017-06-29: qty 250

## 2017-06-29 MED ORDER — ORAL CARE MOUTH RINSE
15.0000 mL | Freq: Two times a day (BID) | OROMUCOSAL | Status: DC
  Administered 2017-06-30 – 2017-07-07 (×11): 15 mL via OROMUCOSAL

## 2017-06-29 NOTE — ED Notes (Signed)
This RN unsuccessful with blood draw  X2. Phlebotomy at bedside to attempt.

## 2017-06-29 NOTE — Consult Note (Signed)
Kathleen [name] Admit Date: 06/29/2017 06/29/2017 Kathleen [name] Requesting Physician:  Kathleen Cables MD  Reason for Consult:  Renal Failure, HTN urgency HPI:  57 year old female presented to the emergency room this morning with progressive dyspnea and very elevated high blood pressure.  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 Justin [name] in early 2018 in our office, no follow-up since that time  Long-standing SLE with history of renal, CNS involvement; has followed with [facility] rheumatology; on prednisone, mycophenolate, hydroxychloroquine; based upon rheumatology notes it appears that the primary indication for ongoing immunosuppression is to salvage any residual renal function  Hypertension on felodipine, hydralazine, labetalol; notably not a diuretic  Ongoing tobacco use  Chronic anemia  Upon presentation patient very hypertensive with intake blood pressure of 220/153 with marketed respiratory distress requiring BiPAP.  No fevers.  Chest x-ray with bilateral infiltrates consistent with pulmonary edema.  Placed on nitroglycerin drip.  Continue outpatient medications.  Patient does not have a current usable access for dialysis.  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  Upon presentation her creatinine is 7.2, potassium 5.3, bicarbonate 18 with anion gap of 12 and albumin of 3.6, calcium 8.7, hemoglobin 8.6, white count 6.8, platelets 248.   Creatinine, Ser (mg/dL)  Date Value  06/29/2017 7.14 (H)  06/29/2017 7.21 (H)  07/16/2016 3.27 (H)  07/15/2016 3.25 (H)  07/14/2016 3.25 (H)  07/13/2016 3.10 (H)  07/10/2016 3.39 (H)  07/09/2016 2.94 (H)  07/08/2016 3.12 (H)  06/29/2016 2.81 (H)  ]  ROS Balance of 12 systems is negative w/ exceptions as  above  PMH  Past Medical History:  Diagnosis Date  . Acute renal failure (ARF) ([place])   . Anemia   . Arthritis   . Cardiac arrest ([place])   . Cellulitis   . Cerebral vasculitis   . COPD (chronic obstructive pulmonary disease) ([place])   . Elevated antinuclear antibody (ANA) level   . Endometriosis   . Gallstones   . Hypertension   . Lupus   . Migraine   . PONV (postoperative nausea and vomiting)   . Seizures ([place])   . Sepsis ([place])   . Suicide attempt   . TB (tuberculosis)   . Transient ischemic attack (TIA)   . Vasculitis of skin    PSH  Past Surgical History:  Procedure Laterality Date  . CESAREAN SECTION    . ESOPHAGOGASTRODUODENOSCOPY N/A 07/15/2016   Procedure: ESOPHAGOGASTRODUODENOSCOPY (EGD);  Surgeon: Kathleen Spates, MD;  Location: [facility] ENDOSCOPY;  Service: Endoscopy;  Laterality: N/A;  . TUBAL LIGATION     FH No family history on file. SH  reports that she has been smoking cigarettes.  She has been smoking about 0.50 packs per day. she has never used smokeless tobacco. She reports that she does not drink alcohol or use drugs. Allergies No Known Allergies Home medications Prior to Admission medications   Medication Sig Start Date End Date Taking? Authorizing Provider  felodipine (PLENDIL) 5 MG 24 hr tablet Take 2 tablets (10 mg total) by mouth daily. 06/29/16  Yes Kathleen Pour, MD  ferrous gluconate (FERGON) 324 MG tablet Take 324 mg by mouth daily. 04/14/17  Yes [provider]  gabapentin (NEURONTIN) 300 MG capsule Take 300 mg by mouth daily. 04/19/17  Yes [provider]  hydrALAZINE (APRESOLINE) 25 MG tablet Take 1 tablet (25 mg total) by mouth every 8 (eight) hours. 07/16/16  Yes Kathleen Pour, MD  hydroxychloroquine (PLAQUENIL) 200 MG tablet Take 1.5 tablets (300 mg total) by mouth daily. 06/29/16  Yes Kathleen Pour, MD  labetalol (NORMODYNE) 200 MG tablet Take 1 tablet (200 mg total) by mouth 2 (two) times daily. 07/16/16  Yes Kathleen Pour, MD   mycophenolate (CELLCEPT) 500 MG tablet Take 2 tablets (1,000 mg total) by mouth 2 (two) times daily. 06/29/16  Yes Kathleen Pour, MD  predniSONE (DELTASONE) 20 MG tablet Take 2 tablets (40 mg total) by mouth daily with breakfast. Take two tablets by mouth daily until rheumatology follow up. 06/29/16  Yes Kathleen Pour, MD  ranitidine (ZANTAC) 150 MG tablet Take 150 mg by mouth daily. 06/19/17  Yes [provider]  sevelamer carbonate (RENVELA) 800 MG tablet Take 800 mg by mouth 3 (three) times daily with meals. 04/14/17  Yes [provider]  sodium bicarbonate 650 MG tablet Take 1 tablet (650 mg total) by mouth daily. 06/29/16  Yes Kathleen Pour, MD    Current Medications Scheduled Meds: . azithromycin  500 mg Oral Daily  . [START ON 06/30/2017] furosemide  80 mg Intravenous Daily  . heparin  5,000 Units Subcutaneous Q8H  . ipratropium-albuterol  3 mL Nebulization Q6H  . labetalol  100 mg Oral BID  . ondansetron (ZOFRAN) IV  4 mg Intravenous Once  . pantoprazole  40 mg Oral Daily  . sodium bicarbonate  650 mg Oral TID   Continuous Infusions: . sodium chloride    . nitroGLYCERIN 30 mcg/min (06/29/17 1511)   PRN Meds:.sodium chloride  CBC Recent Labs  Lab 06/29/17 1057  WBC 6.8  NEUTROABS 5.1  HGB 8.6*  HCT 28.5*  MCV 94.4  PLT 784   Basic Metabolic Panel Recent Labs  Lab 06/29/17 1057 06/29/17 1455  NA 140  --   K 5.3*  --   CL 110  --   CO2 18*  --   GLUCOSE 102*  --   BUN 61*  --   CREATININE 7.21* 7.14*  CALCIUM 8.7*  --     Physical Exam  Blood pressure (!) 189/114, pulse 76, resp. rate 12, SpO2 100 %. GEN: On BiPAP, breathing comfortably, awake, alert ENT: NCAT EYES: EOMI CV: Regular rhythm, normal rate, no rub PULM: Bibasilar crackles, no wheezing ABD: Soft, nontender SKIN: No rashes or lesions EXT: Trace peripheral edema   Assessment 55F presenting with hypertensive emergency, acute respiratory failure requiring BiPAP, and likely  progressive renal failure nearing end-stage.  Given the presentation I will recommend the patient to initiate hemodialysis; I started that conversation in the emergency room but will continue tomorrow hopefully when she is off BiPAP and feeling better.  For now we will increase her diuretics to 160 mg IV 3 times daily with; discontinue sodium bicarbonate given hypervolemia/pulmonary edema; and will follow along closely.  1. 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 HTN Emergency with PUlmonary Edema on BiPAP 3. SLE, quiescent it appears on MMF/Pred/Hydroxychloroquine 4. ANemia, chornic likely related to CKD and chronic disesae 5. Ongoing tobacco user  Plan 1. Increase lasix to 160 IV TID 2. Stop NaHCO3 3. Hopefully off BiPAP tomorrow and can discuss starting HD 4. Check Fe levels, PTH 5. Daily weights, Daily Renal Panel, Strict I/Os, Avoid nephrotoxins (NSAIDs,  judicious IV Contrast)    Pearson Grippe MD [phone] pgr 06/29/2017, 4:23 PM

## 2017-06-29 NOTE — ED Notes (Signed)
Provider at bedside

## 2017-06-29 NOTE — H&P (Signed)
PULMONARY / CRITICAL CARE MEDICINE   Name: Kathleen [name] MRN: [id number] DOB: [birth year]    ADMISSION DATE:  06/29/2017    CHIEF COMPLAINT:  dyspnea  HISTORY OF PRESENT ILLNESS:        This is a 57 year old with a history of lupus and advanced renal disease who presents with a 4-day history of dyspnea.  It appears that her dyspnea was insidiously progressive.  She does report some cough productive of purulent sputum but denies any fevers chills sweats or rigors.  She denies sick contacts.  She denies chest pain.  She is noted to be profoundly hypertensive in the department of emergency medicine.  She tells me that she has been compliant at home with her usual medications.  She denies the use of street drugs.  PAST MEDICAL HISTORY :  She  has a past medical history of Acute renal failure (ARF) ([place]), Anemia, Arthritis, Cardiac arrest ([place]), Cellulitis, Cerebral vasculitis, COPD (chronic obstructive pulmonary disease) ([facility]), Elevated antinuclear antibody (ANA) level, Endometriosis, Gallstones, Hypertension, Lupus, Migraine, PONV (postoperative nausea and vomiting), Seizures ([place]), Sepsis ([place]), Suicide attempt, TB (tuberculosis), Transient ischemic attack (TIA), and Vasculitis of skin.  PAST SURGICAL HISTORY: She  has a past surgical history that includes Cesarean section; Tubal ligation; and Esophagogastroduodenoscopy (N/A, 07/15/2016).  No Known Allergies  No current facility-administered medications on file prior to encounter.    Current Outpatient Medications on File Prior to Encounter  Medication Sig  . felodipine (PLENDIL) 5 MG 24 hr tablet Take 2 tablets (10 mg total) by mouth daily.  . ferrous gluconate (FERGON) 324 MG tablet Take 324 mg by mouth daily.  Marland Kitchen gabapentin (NEURONTIN) 300 MG capsule Take 300 mg by mouth daily.  . hydrALAZINE (APRESOLINE) 25 MG tablet Take 1 tablet (25 mg total) by mouth every 8 (eight) hours.  . hydroxychloroquine (PLAQUENIL) 200 MG tablet Take 1.5  tablets (300 mg total) by mouth daily.  Marland Kitchen labetalol (NORMODYNE) 200 MG tablet Take 1 tablet (200 mg total) by mouth 2 (two) times daily.  . mycophenolate (CELLCEPT) 500 MG tablet Take 2 tablets (1,000 mg total) by mouth 2 (two) times daily.  . predniSONE (DELTASONE) 20 MG tablet Take 2 tablets (40 mg total) by mouth daily with breakfast. Take two tablets by mouth daily until rheumatology follow up.  . ranitidine (ZANTAC) 150 MG tablet Take 150 mg by mouth daily.  . sevelamer carbonate (RENVELA) 800 MG tablet Take 800 mg by mouth 3 (three) times daily with meals.  . sodium bicarbonate 650 MG tablet Take 1 tablet (650 mg total) by mouth daily.    FAMILY HISTORY:  Her has no family status information on file.    SOCIAL HISTORY: She  reports that she has been smoking cigarettes.  She has been smoking about 0.50 packs per day. she has never used smokeless tobacco. She reports that she does not drink alcohol or use drugs.  REVIEW OF SYSTEMS:   Very difficult to obtain due to the presence of a BiPAP mask.  SUBJECTIVE:  As above  VITAL SIGNS: BP (!) 191/113   Pulse 74   Resp 14   SpO2 100%   HEMODYNAMICS:    VENTILATOR SETTINGS: FiO2 (%):  [40 %] 40 %  INTAKE / OUTPUT: No intake/output data recorded.  PHYSICAL EXAMINATION: General: She appears to be her stated age and is not in acute distress while on the BiPAP. Neuro: She attempts to be conversant but has a great deal of difficulty with the mask in  place.  Pupils are equal and EOMs appear to be full the face is symmetric and she moves all fours on request. Cardiovascular: She does not have JVD, S1 and S2 are regular without murmur rub or gallop.  The feet are warm there is 1+ dependent edema bilaterally Lungs: Aspirations are unlabored on BiPAP there is symmetric air movement, no wheezes. Abdomen: The abdomen is soft without any overt organomegaly masses tenderness guarding or rebound Musculoskeletal: She indicates a site in the  right forearm where an AV fistula was allegedly placed, I can palpate no thrill.   LABS:  BMET Recent Labs  Lab 06/29/17 1057  NA 140  K 5.3*  CL 110  CO2 18*  BUN 61*  CREATININE 7.21*  GLUCOSE 102*    Electrolytes Recent Labs  Lab 06/29/17 1057  CALCIUM 8.7*    CBC Recent Labs  Lab 06/29/17 1057  WBC 6.8  HGB 8.6*  HCT 28.5*  PLT 248    Coag's No results for input(s): APTT, INR in the last 168 hours.  Sepsis Markers Recent Labs  Lab 06/29/17 1112  LATICACIDVEN 1.47    ABG Recent Labs  Lab 06/29/17 1208  PHART 7.255*  PCO2ART 40.2  PO2ART 210.0*    Liver Enzymes Recent Labs  Lab 06/29/17 1057  AST 60*  ALT 30  ALKPHOS 88  BILITOT 0.8  ALBUMIN 3.6    Cardiac Enzymes No results for input(s): TROPONINI, PROBNP in the last 168 hours.  Glucose No results for input(s): GLUCAP in the last 168 hours.  Imaging Dg Chest Port 1 View  Result Date: 06/29/2017 CLINICAL DATA:  Shortness of breath, COPD EXAM: PORTABLE CHEST 1 VIEW COMPARISON:  07/08/2016 FINDINGS: Hyperinflated lungs as can be seen with COPD. Diffuse bilateral interstitial thickening. Small right pleural effusion. No left pleural effusion. No pneumothorax. Stable cardiomegaly. No acute osseous abnormality. IMPRESSION: Mild CHF. Electronically Signed   By: Kathreen Devoid   On: 06/29/2017 10:15           DISCUSSION:      This is a 57 year old with lupus and advanced renal disease who presents with dyspnea and extreme hypertension.  ASSESSMENT / PLAN:  PULMONARY A: I am attributing her dyspnea to what is essentially hypertensive crisis which will be controlled by continuing her usual labetalol to which a nitroglycerin drip will be added in the acute phase.  She does report a cough I very doubt an infectious component to her dyspnea based on the chest x-ray however will be screening for influenza A due to the number of cases we are seeing recently and cover for tracheobronchitis  with azithromycin.  CARDIOVASCULAR A: An echocardiogram is pending.  Blood pressure is being controlled as noted  RENAL A: The patient reports she still makes urine is approaching the point of requiring a chronic dialysis and I will consult the nephrology service both to determine whether they feel dialysis is indicated and to determine whether this should be any adjustment in her suppressive regimen.  GASTROINTESTINAL A: Prophylaxis will be with Protonix  HEMATOLOGIC A: DVT prophylaxis will be with subcutaneous heparin   INFECTIOUS A: Above  Lars Masson, MD Pulmonary and [place] Pager: [phone]  06/29/2017, 1:30 PM

## 2017-06-29 NOTE — ED Triage Notes (Signed)
Pt to ER for acute onset shortness of breath 4 days ago but more acute this morning, labored respirations, inability to talk in complete sentences, hypertensive at 220/153 (repeated), HR 120, received 10 mg albuterol in route, .5 of atroven, 125 mg solumedrol in route. Pt in distress on arrival.

## 2017-06-29 NOTE — ED Provider Notes (Signed)
[facility] EMERGENCY DEPARTMENT Provider Note   CSN: [id number] Arrival date & time: 06/29/17  0932     History   Chief Complaint Chief Complaint  Patient presents with  . Respiratory Distress    HPI Kathleen [name] is a 57 y.o. female history of CKD, previous cardiac arrest, COPD, lupus on cellcept and prednisone, here presenting with shortness of breath.  Patient has been having some cough for the last 4 days to the point that she is unable to smoke.  This morning, she woke up and had worsening shortness of breath.  Patient was noted to have labored respirations and has trouble speaking in full sentences per EMS so she was given 10 mg of albuterol, 0.5 mg of Atrovent, 125 mg of Solu-Medrol.  Patient was also noted to be hypertensive 220/153 but has a history of hypertension and is on medicines for it and did not take it today.  Patient denies being on blood thinners. History limited due to patient condition   The history is provided by the patient and the EMS personnel.   Level V caveat- condition of patient   Past Medical History:  Diagnosis Date  . Acute renal failure (ARF) ([place])   . Anemia   . Arthritis   . Cardiac arrest ([place])   . Cellulitis   . Cerebral vasculitis   . COPD (chronic obstructive pulmonary disease) ([place])   . Elevated antinuclear antibody (ANA) level   . Endometriosis   . Gallstones   . Hypertension   . Lupus   . Migraine   . PONV (postoperative nausea and vomiting)   . Seizures ([place])   . Sepsis ([place])   . Suicide attempt   . TB (tuberculosis)   . Transient ischemic attack (TIA)   . Vasculitis of skin     Patient Active Problem List   Diagnosis Date Noted  . Hypertensive crisis 06/29/2017  . Hypertensive emergency   . Lupus erythematosus   . Malnutrition of moderate degree 07/14/2016  . Lupus 06/28/2016  . Hypertension 06/28/2016  . Kidney disease 06/28/2016  . Symptomatic anemia 06/28/2016  . Abdominal pain 06/28/2016  .  Current non-adherence to medical treatment 06/28/2016  . Hypertensive urgency 06/28/2016  . SLE exacerbation ([place]) 06/28/2016  . Renal insufficiency     Past Surgical History:  Procedure Laterality Date  . CESAREAN SECTION    . ESOPHAGOGASTRODUODENOSCOPY N/A 07/15/2016   Procedure: ESOPHAGOGASTRODUODENOSCOPY (EGD);  Surgeon: Laurence Spates, MD;  Location: [facility] ENDOSCOPY;  Service: Endoscopy;  Laterality: N/A;  . TUBAL LIGATION      OB History    No data available       Home Medications    Prior to Admission medications   Medication Sig Start Date End Date Taking? Authorizing Provider  felodipine (PLENDIL) 5 MG 24 hr tablet Take 2 tablets (10 mg total) by mouth daily. 06/29/16  Yes Patrecia Pour, MD  ferrous gluconate (FERGON) 324 MG tablet Take 324 mg by mouth daily. 04/14/17  Yes [provider]  gabapentin (NEURONTIN) 300 MG capsule Take 300 mg by mouth daily. 04/19/17  Yes [provider]  hydrALAZINE (APRESOLINE) 25 MG tablet Take 1 tablet (25 mg total) by mouth every 8 (eight) hours. 07/16/16  Yes Patrecia Pour, MD  hydroxychloroquine (PLAQUENIL) 200 MG tablet Take 1.5 tablets (300 mg total) by mouth daily. 06/29/16  Yes Patrecia Pour, MD  labetalol (NORMODYNE) 200 MG tablet Take 1 tablet (200 mg total) by mouth 2 (  two) times daily. 07/16/16  Yes Patrecia Pour, MD  mycophenolate (CELLCEPT) 500 MG tablet Take 2 tablets (1,000 mg total) by mouth 2 (two) times daily. 06/29/16  Yes Patrecia Pour, MD  predniSONE (DELTASONE) 20 MG tablet Take 2 tablets (40 mg total) by mouth daily with breakfast. Take two tablets by mouth daily until rheumatology follow up. 06/29/16  Yes Patrecia Pour, MD  ranitidine (ZANTAC) 150 MG tablet Take 150 mg by mouth daily. 06/19/17  Yes [provider]  sevelamer carbonate (RENVELA) 800 MG tablet Take 800 mg by mouth 3 (three) times daily with meals. 04/14/17  Yes [provider]  sodium bicarbonate 650 MG tablet Take 1 tablet (650 mg  total) by mouth daily. 06/29/16  Yes Patrecia Pour, MD    Family History No family history on file.  Social History Social History   Tobacco Use  . Smoking status: Current Some Day Smoker    Packs/day: 0.50    Types: Cigarettes  . Smokeless tobacco: Never Used  Substance Use Topics  . Alcohol use: No  . Drug use: No     Allergies   Patient has no known allergies.   Review of Systems Review of Systems  Respiratory: Positive for shortness of breath.   All other systems reviewed and are negative.    Physical Exam Updated Vital Signs BP (!) 191/113   Pulse 74   Resp 14   SpO2 100%   Physical Exam  Constitutional: She is oriented to person, place, and time.  tachypneic + retractions   HENT:  Head: Normocephalic.  Eyes: Conjunctivae and EOM are normal. Pupils are equal, round, and reactive to light.  Neck: Normal range of motion. Neck supple.  Cardiovascular: Normal rate and regular rhythm.  Pulmonary/Chest:  Poor air movement, + retractions, minimal wheezing. Some abdominal breathing   Abdominal: Soft. Bowel sounds are normal.  Musculoskeletal: Normal range of motion.  Neurological: She is alert and oriented to person, place, and time.  Skin: Skin is warm.  Psychiatric: She has a normal mood and affect.  Nursing note and vitals reviewed.    ED Treatments / Results  Labs (all labs ordered are listed, but only abnormal results are displayed) Labs Reviewed  CBC WITH DIFFERENTIAL/PLATELET - Abnormal; Notable for the following components:      Result Value   RBC 3.02 (*)    Hemoglobin 8.6 (*)    HCT 28.5 (*)    All other components within normal limits  COMPREHENSIVE METABOLIC PANEL - Abnormal; Notable for the following components:   Potassium 5.3 (*)    CO2 18 (*)    Glucose, Bld 102 (*)    BUN 61 (*)    Creatinine, Ser 7.21 (*)    Calcium 8.7 (*)    AST 60 (*)    GFR calc non Af Amer 6 (*)    GFR calc Af Amer 7 (*)    All other components within  normal limits  BRAIN NATRIURETIC PEPTIDE - Abnormal; Notable for the following components:   B Natriuretic Peptide 923.5 (*)    All other components within normal limits  I-STAT ARTERIAL BLOOD GAS, ED - Abnormal; Notable for the following components:   pH, Arterial 7.255 (*)    pO2, Arterial 210.0 (*)    Bicarbonate 17.8 (*)    TCO2 19 (*)    Acid-base deficit 9.0 (*)    All other components within normal limits  CULTURE, BLOOD (ROUTINE X 2)  CULTURE, BLOOD (ROUTINE X 2)  INFLUENZA PANEL BY PCR (TYPE A & B)  URINALYSIS, ROUTINE W REFLEX MICROSCOPIC  RAPID URINE DRUG SCREEN, HOSP PERFORMED  BLOOD GAS, ARTERIAL  HIV ANTIBODY (ROUTINE TESTING)  CBC  CREATININE, SERUM  I-STAT TROPONIN, ED  I-STAT CG4 LACTIC ACID, ED  I-STAT CG4 LACTIC ACID, ED    EKG  EKG Interpretation  Date/Time:  Wednesday June 29 2017 10:01:14 EST Ventricular Rate:  106 PR Interval:    QRS Duration: 99 QT Interval:  371 QTC Calculation: 493 R Axis:   16 Text Interpretation:  Sinus tachycardia Probable left atrial enlargement Nonspecific T abnormalities, lateral leads Borderline prolonged QT interval No significant change since last tracing Confirmed by Wandra Arthurs ([id number]) on 06/29/2017 10:58:48 AM       Radiology Dg Chest Port 1 View  Result Date: 06/29/2017 CLINICAL DATA:  Shortness of breath, COPD EXAM: PORTABLE CHEST 1 VIEW COMPARISON:  07/08/2016 FINDINGS: Hyperinflated lungs as can be seen with COPD. Diffuse bilateral interstitial thickening. Small right pleural effusion. No left pleural effusion. No pneumothorax. Stable cardiomegaly. No acute osseous abnormality. IMPRESSION: Mild CHF. Electronically Signed   By: Kathreen Devoid   On: 06/29/2017 10:15    Procedures Procedures (including critical care time)  CRITICAL CARE Performed by: Wandra Arthurs   Total critical care time: 40 minutes  Critical care time was exclusive of separately billable procedures and treating other  patients.  Critical care was necessary to treat or prevent imminent or life-threatening deterioration.  Critical care was time spent personally by me on the following activities: development of treatment plan with patient and/or surrogate as well as nursing, discussions with consultants, evaluation of patient's response to treatment, examination of patient, obtaining history from patient or surrogate, ordering and performing treatments and interventions, ordering and review of laboratory studies, ordering and review of radiographic studies, pulse oximetry and re-evaluation of patient's condition.   Medications Ordered in ED Medications  ondansetron (ZOFRAN) injection 4 mg (0 mg Intravenous Hold 06/29/17 1000)  nitroGLYCERIN 50 mg in dextrose 5 % 250 mL (0.2 mg/mL) infusion (not administered)  labetalol (NORMODYNE) tablet 100 mg (not administered)  sodium bicarbonate tablet 650 mg (not administered)  azithromycin (ZITHROMAX) tablet 500 mg (not administered)  ipratropium-albuterol (DUONEB) 0.5-2.5 (3) MG/3ML nebulizer solution 3 mL (not administered)  furosemide (LASIX) injection 80 mg (not administered)  0.9 %  sodium chloride infusion (not administered)  heparin injection 5,000 Units (not administered)  labetalol (NORMODYNE,TRANDATE) injection 10 mg (10 mg Intravenous Given 06/29/17 1023)  magnesium sulfate IVPB 2 g 50 mL (0 g Intravenous Stopped 06/29/17 1101)  albuterol (PROVENTIL,VENTOLIN) solution continuous neb (15 mg/hr Nebulization Given 06/29/17 1000)  ipratropium (ATROVENT) nebulizer solution 0.5 mg (0.5 mg Nebulization Given 06/29/17 1000)  nitroGLYCERIN 50 mg in dextrose 5 % 250 mL (0.2 mg/mL) infusion (5 mcg/min Intravenous New Bag/Given 06/29/17 1207)  furosemide (LASIX) injection 40 mg (40 mg Intravenous Given 06/29/17 1203)     Initial Impression / Assessment and Plan / ED Course  I have reviewed the triage vital signs and the nursing notes.  Pertinent labs & imaging results  that were available during my care of the patient were reviewed by me and considered in my medical decision making (see chart for details).     Kathleen [name] is a 57 y.o. female here with SOB. Likely COPD vs CHF vs pneumonia vs flu. Given nebs, solumedrol by EMS. Will give magnesium. Will start bipap. Will do sepsis workup and swab  for flu. Hypertensive in the ED but has hx of hypertension and I have low suspicion for dissection so if CXR showed no widened mediastium, will not get CTA. Will need admission.  12:30 pm Patient has multi system failure with hypoxia. Patient has CHF exacerbation with AKI (Cr 7, baseline around 3.4). CXR showed pulmonary edema. AGB showed pH 7.25 with bicarb 18 and CO2 40. I think she has metabolic acidosis with respiratory compensation. I think this is all from hypertensive emergency. Started on nitro drip and lasix for diuresis and hypertensive emergency. Still on bipap. Critical care to admit.   1:36 PM Critical care to admit.     Final Clinical Impressions(s) / ED Diagnoses   Final diagnoses:  Hypertensive emergency  AKI (acute kidney injury) ([place])  Acute on chronic congestive heart failure, unspecified heart failure type [facility])    ED Discharge Orders    None       Drenda Freeze, MD 06/29/17 1336

## 2017-06-30 ENCOUNTER — Inpatient Hospital Stay (HOSPITAL_COMMUNITY): Payer: Medicare Other

## 2017-06-30 ENCOUNTER — Other Ambulatory Visit: Payer: Self-pay

## 2017-06-30 DIAGNOSIS — N185 Chronic kidney disease, stage 5: Secondary | ICD-10-CM

## 2017-06-30 DIAGNOSIS — I351 Nonrheumatic aortic (valve) insufficiency: Secondary | ICD-10-CM

## 2017-06-30 DIAGNOSIS — J810 Acute pulmonary edema: Secondary | ICD-10-CM

## 2017-06-30 DIAGNOSIS — I161 Hypertensive emergency: Principal | ICD-10-CM

## 2017-06-30 LAB — ECHOCARDIOGRAM COMPLETE: WEIGHTICAEL: 1932.99 [oz_av]

## 2017-06-30 LAB — BASIC METABOLIC PANEL
Anion gap: 16 — ABNORMAL HIGH (ref 5–15)
BUN: 65 mg/dL — AB (ref 6–20)
CHLORIDE: 105 mmol/L (ref 101–111)
CO2: 17 mmol/L — ABNORMAL LOW (ref 22–32)
Calcium: 8.9 mg/dL (ref 8.9–10.3)
Creatinine, Ser: 7.24 mg/dL — ABNORMAL HIGH (ref 0.44–1.00)
GFR, EST AFRICAN AMERICAN: 7 mL/min — AB (ref >60)
GFR, EST NON AFRICAN AMERICAN: 6 mL/min — AB (ref >60)
Glucose, Bld: 112 mg/dL — ABNORMAL HIGH (ref 65–99)
Potassium: 5.1 mmol/L (ref 3.5–5.1)
SODIUM: 138 mmol/L (ref 135–145)

## 2017-06-30 LAB — BLOOD GAS, ARTERIAL
ACID-BASE DEFICIT: 4.1 mmol/L — AB (ref 0.0–2.0)
Bicarbonate: 20.1 mmol/L (ref 20.0–28.0)
DRAWN BY: 51702
O2 Content: 2 L/min
O2 SAT: 97.5 %
PATIENT TEMPERATURE: 98.6
PH ART: 7.384 (ref 7.350–7.450)
pCO2 arterial: 34.5 mmHg (ref 32.0–48.0)
pO2, Arterial: 102 mmHg (ref 83.0–108.0)

## 2017-06-30 LAB — CBC
HCT: 24.7 % — ABNORMAL LOW (ref 36.0–46.0)
Hemoglobin: 7.6 g/dL — ABNORMAL LOW (ref 12.0–15.0)
MCH: 28.5 pg (ref 26.0–34.0)
MCHC: 30.8 g/dL (ref 30.0–36.0)
MCV: 92.5 fL (ref 78.0–100.0)
PLATELETS: 261 10*3/uL (ref 150–400)
RBC: 2.67 MIL/uL — AB (ref 3.87–5.11)
RDW: 14.5 % (ref 11.5–15.5)
WBC: 5.7 10*3/uL (ref 4.0–10.5)

## 2017-06-30 LAB — IRON AND TIBC
Iron: 25 ug/dL — ABNORMAL LOW (ref 28–170)
Saturation Ratios: 13 % (ref 10.4–31.8)
TIBC: 193 ug/dL — AB (ref 250–450)
UIBC: 168 ug/dL

## 2017-06-30 LAB — PHOSPHORUS: PHOSPHORUS: 5 mg/dL — AB (ref 2.5–4.6)

## 2017-06-30 LAB — FERRITIN: Ferritin: 290 ng/mL (ref 11–307)

## 2017-06-30 LAB — HIV ANTIBODY (ROUTINE TESTING W REFLEX): HIV Screen 4th Generation wRfx: NONREACTIVE

## 2017-06-30 LAB — GLUCOSE, CAPILLARY: Glucose-Capillary: 126 mg/dL — ABNORMAL HIGH (ref 65–99)

## 2017-06-30 MED ORDER — HYDRALAZINE HCL 20 MG/ML IJ SOLN
10.0000 mg | INTRAMUSCULAR | Status: DC | PRN
  Filled 2017-06-30: qty 1

## 2017-06-30 MED ORDER — FAMOTIDINE 20 MG PO TABS
20.0000 mg | ORAL_TABLET | Freq: Two times a day (BID) | ORAL | Status: DC
  Administered 2017-06-30: 20 mg via ORAL
  Filled 2017-06-30: qty 1

## 2017-06-30 MED ORDER — IPRATROPIUM-ALBUTEROL 0.5-2.5 (3) MG/3ML IN SOLN
3.0000 mL | Freq: Three times a day (TID) | RESPIRATORY_TRACT | Status: DC
  Administered 2017-07-01 – 2017-07-05 (×14): 3 mL via RESPIRATORY_TRACT
  Filled 2017-06-30 (×14): qty 3

## 2017-06-30 MED ORDER — ACETAMINOPHEN 325 MG PO TABS
650.0000 mg | ORAL_TABLET | ORAL | Status: DC | PRN
  Administered 2017-06-30 – 2017-07-04 (×3): 650 mg via ORAL
  Filled 2017-06-30 (×3): qty 2

## 2017-06-30 MED ORDER — LABETALOL HCL 200 MG PO TABS
200.0000 mg | ORAL_TABLET | Freq: Two times a day (BID) | ORAL | Status: DC
  Administered 2017-06-30 – 2017-07-06 (×13): 200 mg via ORAL
  Filled 2017-06-30 (×14): qty 1

## 2017-06-30 MED ORDER — HYDROXYCHLOROQUINE SULFATE 200 MG PO TABS
200.0000 mg | ORAL_TABLET | Freq: Every day | ORAL | Status: DC
  Administered 2017-06-30 – 2017-07-07 (×8): 200 mg via ORAL
  Filled 2017-06-30 (×8): qty 1

## 2017-06-30 MED ORDER — FELODIPINE ER 10 MG PO TB24
10.0000 mg | ORAL_TABLET | Freq: Every day | ORAL | Status: DC
  Administered 2017-06-30 – 2017-07-04 (×5): 10 mg via ORAL
  Filled 2017-06-30 (×5): qty 1

## 2017-06-30 MED ORDER — LABETALOL HCL 5 MG/ML IV SOLN: 10.0000 mg | INTRAVENOUS | Status: DC | PRN

## 2017-06-30 MED ORDER — HYDRALAZINE HCL 50 MG PO TABS
50.0000 mg | ORAL_TABLET | Freq: Three times a day (TID) | ORAL | Status: DC
  Administered 2017-06-30 – 2017-07-03 (×11): 50 mg via ORAL
  Filled 2017-06-30 (×13): qty 1

## 2017-06-30 MED ORDER — MYCOPHENOLATE MOFETIL 250 MG PO CAPS
1000.0000 mg | ORAL_CAPSULE | Freq: Two times a day (BID) | ORAL | Status: DC
  Administered 2017-06-30 – 2017-07-04 (×9): 1000 mg via ORAL
  Filled 2017-06-30 (×10): qty 4

## 2017-06-30 MED ORDER — FAMOTIDINE 20 MG PO TABS
20.0000 mg | ORAL_TABLET | Freq: Every day | ORAL | Status: DC
  Administered 2017-07-01 – 2017-07-07 (×7): 20 mg via ORAL
  Filled 2017-06-30 (×7): qty 1

## 2017-06-30 MED ORDER — HYDRALAZINE HCL 25 MG PO TABS
25.0000 mg | ORAL_TABLET | Freq: Three times a day (TID) | ORAL | Status: DC
  Filled 2017-06-30 (×2): qty 1

## 2017-06-30 NOTE — Plan of Care (Signed)
  Progressing Education: Knowledge of General Education information will improve 06/30/2017 0733 - Progressing by Cindee Salt, RN 06/30/2017 0732 - Progressing by Cindee Salt, RN 06/30/2017 0730 - Progressing by Cindee Salt, RN Health Behavior/Discharge Planning: Ability to manage health-related needs will improve 06/30/2017 0733 - Progressing by Cindee Salt, RN 06/30/2017 0732 - Progressing by Cindee Salt, RN Clinical Measurements: Ability to maintain clinical measurements within normal limits will improve 06/30/2017 0733 - Progressing by Cindee Salt, RN 06/30/2017 0732 - Progressing by Cindee Salt, RN Will remain free from infection 06/30/2017 0733 - Progressing by Cindee Salt, RN 06/30/2017 0732 - Progressing by Cindee Salt, RN 06/30/2017 0730 - Progressing by Cindee Salt, RN Diagnostic test results will improve 06/30/2017 0733 - Progressing by Cindee Salt, RN 06/30/2017 0732 - Progressing by Cindee Salt, RN Respiratory complications will improve 06/30/2017 0733 - Progressing by Cindee Salt, RN 06/30/2017 0732 - Progressing by Cindee Salt, RN 06/30/2017 0730 - Progressing by Cindee Salt, RN Cardiovascular complication will be avoided 06/30/2017 0733 - Progressing by Cindee Salt, RN 06/30/2017 0732 - Progressing by Cindee Salt, RN Activity: Risk for activity intolerance will decrease 06/30/2017 0733 - Progressing by Cindee Salt, RN 06/30/2017 0732 - Progressing by Cindee Salt, RN 06/30/2017 0730 - Progressing by Cindee Salt, RN Nutrition: Adequate nutrition will be maintained 06/30/2017 0733 - Progressing by Cindee Salt, RN 06/30/2017 0732 - Progressing by Cindee Salt, RN 06/30/2017 0730 - Progressing by Cindee Salt, RN Coping: Level of anxiety will decrease 06/30/2017 0733 - Progressing by Cindee Salt, RN 06/30/2017 0732 - Progressing by Cindee Salt, RN 06/30/2017 0730 -  Progressing by Cindee Salt, RN Elimination: Will not experience complications related to bowel motility 06/30/2017 0733 - Progressing by Cindee Salt, RN 06/30/2017 0732 - Progressing by Cindee Salt, RN Will not experience complications related to urinary retention 06/30/2017 0733 - Progressing by Cindee Salt, RN 06/30/2017 0732 - Progressing by Cindee Salt, RN 06/30/2017 0730 - Progressing by Cindee Salt, RN Pain Managment: General experience of comfort will improve 06/30/2017 0733 - Progressing by Cindee Salt, RN 06/30/2017 0732 - Progressing by Cindee Salt, RN Safety: Ability to remain free from injury will improve 06/30/2017 0733 - Progressing by Cindee Salt, RN 06/30/2017 0732 - Progressing by Cindee Salt, RN 06/30/2017 0730 - Progressing by Cindee Salt, RN Skin Integrity: Risk for impaired skin integrity will decrease 06/30/2017 0733 - Progressing by Cindee Salt, RN 06/30/2017 0732 - Progressing by Cindee Salt, RN 06/30/2017 0730 - Progressing by Cindee Salt, RN

## 2017-06-30 NOTE — Progress Notes (Signed)
PULMONARY / CRITICAL CARE MEDICINE   Name: Kathleen [name] MRN: [id number] DOB: 1960/12/18    ADMISSION DATE:  06/29/2017    CHIEF COMPLAINT:  Dyspnea in the setting of hypertensive crisis  HISTORY OF PRESENT ILLNESS:        This is a 57 year old with a history of lupus and advanced renal disease who presents with a 4-day history of dyspnea.  It appears that her dyspnea was insidiously progressive.  She does report some cough productive of purulent sputum but denies any fevers chills sweats or rigors.  She denies sick contacts.  She denies chest pain.  She is noted to be profoundly hypertensive in the department of emergency medicine.  She tells me that she has been compliant at home with her usual medications.  She denies the use of street drugs.  PAST MEDICAL HISTORY :  She  has a past medical history of Acute renal failure (ARF) ([place]), Anemia, Arthritis, Cardiac arrest (Kathleen [name]), Cellulitis, Cerebral vasculitis, COPD (chronic obstructive pulmonary disease) (HCC), Elevated antinuclear antibody (ANA) level, Endometriosis, Gallstones, Hypertension, Lupus, Migraine, PONV (postoperative nausea and vomiting), Seizures (Kathleen [name]), Sepsis (Kathleen [name]), Suicide attempt (Kathleen [name]), TB (tuberculosis), Transient ischemic attack (TIA), and Vasculitis of skin.  PAST SURGICAL HISTORY: She  has a past surgical history that includes Cesarean section; Tubal ligation; and Esophagogastroduodenoscopy (N/A, 07/15/2016).  No Known Allergies  No current facility-administered medications on file prior to encounter.    Current Outpatient Medications on File Prior to Encounter  Medication Sig  . felodipine (PLENDIL) 5 MG 24 hr tablet Take 2 tablets (10 mg total) by mouth daily.  . ferrous gluconate (FERGON) 324 MG tablet Take 324 mg by mouth daily.  Marland Kitchen gabapentin (NEURONTIN) 300 MG capsule Take 300 mg by mouth daily.  . hydrALAZINE (APRESOLINE) 25 MG tablet Take 1 tablet (25 mg total) by mouth every 8 (eight) hours.  .  hydroxychloroquine (PLAQUENIL) 200 MG tablet Take 1.5 tablets (300 mg total) by mouth daily. (Patient taking differently: Take 200 mg by mouth daily. )  . labetalol (NORMODYNE) 200 MG tablet Take 1 tablet (200 mg total) by mouth 2 (two) times daily.  . mycophenolate (CELLCEPT) 500 MG tablet Take 2 tablets (1,000 mg total) by mouth 2 (two) times daily.  . predniSONE (DELTASONE) 20 MG tablet Take 2 tablets (40 mg total) by mouth daily with breakfast. Take two tablets by mouth daily until rheumatology follow up. (Patient taking differently: Take 40 mg by mouth daily with breakfast. Take two tablets by mouth daily until rheumatology follow up.)  . ranitidine (ZANTAC) 150 MG tablet Take 150 mg by mouth daily.  . sevelamer carbonate (RENVELA) 800 MG tablet Take 800 mg by mouth 3 (three) times daily with meals.  . sodium bicarbonate 650 MG tablet Take 1 tablet (650 mg total) by mouth daily.    FAMILY HISTORY:  Her has no family status information on file.    SOCIAL HISTORY: She  reports that she has been smoking cigarettes.  She has been smoking about 0.50 packs per day. she has never used smokeless tobacco. She reports that she does not drink alcohol or use drugs.  REVIEW OF SYSTEMS:   Very difficult to obtain due to the presence of a BiPAP mask.  SUBJECTIVE:  Overnight patient was weaned off Bipap and placed on Amanda. Patient complained of poor sleep due to headaches (on nitroglycerin gtt). Patient tolerating renal diet.  VITAL SIGNS: BP 126/83   Pulse 89   Temp 98.4 F (36.9 C) (Oral)  Resp 12   Wt 120 lb 13 oz (54.8 kg)   SpO2 97%   BMI 20.74 kg/m   HEMODYNAMICS:    VENTILATOR SETTINGS: FiO2 (%):  [30 %] 30 %  INTAKE / OUTPUT: I/O last 3 completed shifts: In: 866.4 [I.V.:734.4; IV Piggyback:132] Out: 1100 [Urine:1100]  PHYSICAL EXAMINATION: General: She appears to be her stated age and is not in acute distress while on the BiPAP. Neuro: Able to have conversation. Pupils are  equal and EOMs appear to be full the face is symmetric and she moves all fours on request. Cardiovascular: She does not have JVD, S1 and S2 are regular without murmur rub or gallop.  The feet are warm there is 1+ dependent edema bilaterally Lungs: Aspirations are unlabored on BiPAP there is symmetric air movement, no wheezes. Abdomen: The abdomen is soft without any overt organomegaly masses tenderness guarding or rebound Musculoskeletal: She indicates a site in the right forearm where an AV fistula was allegedly placed, I can palpate no thrill.   LABS:  BMET Recent Labs  Lab 06/29/17 1057 06/29/17 1455 06/30/17 0257  NA 140  --  138  K 5.3*  --  5.1  CL 110  --  105  CO2 18*  --  17*  BUN 61*  --  65*  CREATININE 7.21* 7.14* 7.24*  GLUCOSE 102*  --  112*    Electrolytes Recent Labs  Lab 06/29/17 1057 06/30/17 0257  CALCIUM 8.7* 8.9  PHOS  --  5.0*    CBC Recent Labs  Lab 06/29/17 1057 06/30/17 0257  WBC 6.8 5.7  HGB 8.6* 7.6*  HCT 28.5* 24.7*  PLT 248 261    Coag's No results for input(s): APTT, INR in the last 168 hours.  Sepsis Markers Recent Labs  Lab 06/29/17 1112  LATICACIDVEN 1.47    ABG Recent Labs  Lab 06/29/17 1208 06/30/17 0512  PHART 7.255* 7.384  PCO2ART 40.2 34.5  PO2ART 210.0* 102    Liver Enzymes Recent Labs  Lab 06/29/17 1057  AST 60*  ALT 30  ALKPHOS 88  BILITOT 0.8  ALBUMIN 3.6    Cardiac Enzymes No results for input(s): TROPONINI, PROBNP in the last 168 hours.  Glucose Recent Labs  Lab 06/29/17 1717  GLUCAP 126*    Imaging No results found.  Lines/Tubes: Foley Catheter     DISCUSSION:      This is a 57 year old with lupus and advanced renal disease who presents with dyspnea and extreme hypertension.  ASSESSMENT / PLAN:  PULMONARY A:  Dyspnea has resolved on Nitro gtt and prn meds. Off bipap overnight transition to [place] now on RA. Respiratory distress likely 2/2 hypertensive crisis. I am attributing  her dyspnea to what is essentially hypertensive crisis. RV negative, azithromycin for tracheobronchitis.   P: Continue to monitor respirator status  O2 therapy prn  CARDIOVASCULAR A:  BP controlled improved overnight while on NTG drip and prn meds. Will transition to home oral regimen.  P: Resume felodipine, hydralazine, labetalol PRN Hydralazine and labetalol  RENAL A:  Acute on chronic CKD-V now ESRD. Will need access placement as she is not an HD candidate and has discussed option with nephrology.  P: Follow up on nephrology consult, appreciate recs VVS consult per nephro Continue prednisone Continue lasix  GASTROINTESTINAL A:  Renal diet. PPX transition to H2 blockers from PPI given ESRD.  P: H2 Blocker Renal diet HEMATOLOGIC  A: DVT prophylaxis will be with subcutaneous heparin   INFECTIOUS A:  No sign of infection, afebrile with normal vitals.  Cough not consistent with pulmonary infection given negative CXR findings. Currently treating possible tracheobronchitis. RVP negative.  P: Continue azithromycin  Follow up on Blood cultures  Neurologic A: AOx3  P: Continue to monitor   Marjie Skiff, MD [place], PGY-2   06/30/2017, 12:13 PM

## 2017-06-30 NOTE — Progress Notes (Signed)
  Echocardiogram 2D Echocardiogram has been performed.  Kathleen [name] T Nataleigh Griffin 06/30/2017, 10:52 AM

## 2017-06-30 NOTE — Progress Notes (Signed)
   Daily Progress Note  Chart briefly reviewed.  Full consult to follow.  - Vein mapping ordered - Options dependent on residual veins - Pt is not surgical candidate until HTN well controlled as Anes will cancel any case  Adele Barthel, MD, FACS [facility] [place] Office: [phone] Pager: [phone]  06/30/2017, 12:40 PM

## 2017-06-30 NOTE — H&P (View-Only) (Signed)
CONSULT NOTE   MRN : [id number]  Reason for Consult: acute ESRD Referring Physician: Dr. Joelyn Oms  History of Present Illness:   Kathleen [name] is a 57 y.o. ([birth year]) female RHD with known chronic kidney disease stage IV-V.  Pt previously had a R RC AVF that failed placed at [facility] in [place].  She was admitted with hypertensive emergency and progression into ESRD.  The patient was told by Dr. Joelyn Oms that she needs to start hemodialysis.  The patient denies any prior central venous cannulations.  She denies any permanent pacemaker placement.  She is reluctant to proceed with tunneled dialysis catheter placement.  Past medical hx: TIA,Lupus, MI, AKI/CKD, TB, and HTN.  She is right hand dominant.  Current Facility-Administered Medications  Medication Dose Route Frequency Provider Last Rate Last Dose  . 0.9 %  sodium chloride infusion  250 mL Intravenous PRN Sampson Goon, MD      . acetaminophen (TYLENOL) tablet 650 mg  650 mg Oral Q4H PRN Laverle Hobby, MD   650 mg at 06/30/17 0800  . azithromycin (ZITHROMAX) tablet 500 mg  500 mg Oral Daily Sampson Goon, MD   500 mg at 06/30/17 0912  . [START ON 07/01/2017] famotidine (PEPCID) tablet 20 mg  20 mg Oral Daily Susa Raring, [place]      . felodipine (PLENDIL) 24 hr tablet 10 mg  10 mg Oral Daily Diallo, Abdoulaye, MD   10 mg at 06/30/17 1105  . furosemide (LASIX) 160 mg in dextrose 5 % 50 mL IVPB  160 mg Intravenous Q8H Pearson Grippe B, MD 66 mL/hr at 06/30/17 0548 160 mg at 06/30/17 0548  . heparin injection 5,000 Units  5,000 Units Subcutaneous Q8H Sampson Goon, MD   5,000 Units at 06/30/17 0606  . hydrALAZINE (APRESOLINE) injection 10-40 mg  10-40 mg Intravenous Q4H PRN Rigoberto Noel, MD      . hydrALAZINE (APRESOLINE) tablet 50 mg  50 mg Oral Q8H Sanford, Ryan B, MD      . hydroxychloroquine (PLAQUENIL) tablet 200 mg  200 mg Oral Daily Diallo, Abdoulaye, MD   200 mg at 06/30/17 1106  .  ipratropium-albuterol (DUONEB) 0.5-2.5 (3) MG/3ML nebulizer solution 3 mL  3 mL Nebulization Q6H Sampson Goon, MD   3 mL at 06/30/17 0311  . labetalol (NORMODYNE) tablet 200 mg  200 mg Oral BID Diallo, Abdoulaye, MD   200 mg at 06/30/17 0912  . labetalol (NORMODYNE,TRANDATE) injection 10-20 mg  10-20 mg Intravenous Q2H PRN Rigoberto Noel, MD      . MEDLINE mouth rinse  15 mL Mouth Rinse BID Sampson Goon, MD   15 mL at 06/30/17 0912  . mycophenolate (CELLCEPT) capsule 1,000 mg  1,000 mg Oral BID Diallo, Abdoulaye, MD      . nitroGLYCERIN 50 mg in dextrose 5 % 250 mL (0.2 mg/mL) infusion  0-200 mcg/min Intravenous Titrated Sampson Goon, MD 54 mL/hr at 06/30/17 1108 180 mcg/min at 06/30/17 1108  . ondansetron (ZOFRAN) injection 4 mg  4 mg Intravenous Once Drenda Freeze, MD   Stopped at 06/29/17 1000    Pt meds include: Statin :No Betablocker: Yes ASA: No Other anticoagulants/antiplatelets: none  Past Medical History:  Diagnosis Date  . Acute renal failure (ARF) ([place])   . Anemia   . Arthritis   . Cardiac arrest ([place])   . Cellulitis   . Cerebral vasculitis   . COPD (chronic obstructive pulmonary disease) (  HCC)   . Elevated antinuclear antibody (ANA) level   . Endometriosis   . Gallstones   . Hypertension   . Lupus   . Migraine   . PONV (postoperative nausea and vomiting)   . Seizures ([place])   . Sepsis ([place])   . Suicide attempt ([place])   . TB (tuberculosis)   . Transient ischemic attack (TIA)   . Vasculitis of skin     Past Surgical History:  Procedure Laterality Date  . CESAREAN SECTION    . ESOPHAGOGASTRODUODENOSCOPY N/A 07/15/2016   Procedure: ESOPHAGOGASTRODUODENOSCOPY (EGD);  Surgeon: Laurence Spates, MD;  Location: [facility] ENDOSCOPY;  Service: Endoscopy;  Laterality: N/A;  . TUBAL LIGATION      Social History Social History   Tobacco Use  . Smoking status: Current Some Day Smoker    Packs/day: 0.50    Types: Cigarettes  . Smokeless tobacco: Never Used  Substance  Use Topics  . Alcohol use: No  . Drug use: No    Family History History reviewed. No pertinent family history.  unknown  No Known Allergies   REVIEW OF SYSTEMS  General: [ ]  Weight loss, [ ]  Fever, [ ]  chills Neurologic: [ ]  Dizziness, [ ]  Blackouts, [ ]  Seizure [ ]  Stroke, [ ]  "Mini stroke", [ ]  Slurred speech, [ ]  Temporary blindness; [ ]  weakness in arms or legs, [ ]  Hoarseness [ ]  Dysphagia Cardiac: [ ]  Chest pain/pressure, [ ]  Shortness of breath at rest [ ]  Shortness of breath with exertion, [ ]  Atrial fibrillation or irregular heartbeat  Vascular: [ ]  Pain in legs with walking, [ ]  Pain in legs at rest, [ ]  Pain in legs at night,  [ ]  Non-healing ulcer, [ ]  Blood clot in vein/DVT,   Pulmonary: [ ]  Home oxygen, [ ]  Productive cough, [ ]  Coughing up blood, [ ]  Asthma,  [ ]  Wheezing [ ]  COPD Musculoskeletal:  [x ] Arthritis, [ ]  Low back pain, [x ] Joint pain Hematologic: [ ]  Easy Bruising, [x ] Anemia; [ ]  Hepatitis Gastrointestinal: [ ]  Blood in stool, [ ]  Gastroesophageal Reflux/heartburn, [x]  N/V Urinary: [x ] chronic Kidney disease, [ ]  on HD - [ ]  MWF or [ ]  TTHS, [ ]  Burning with urination, [ ]  Difficulty urinating Skin: [ ]  Rashes, [ ]  Wounds Psychological: [ ]  Anxiety, [ ]  Depression  Physical Examination Vitals:   06/30/17 0912 06/30/17 1000 06/30/17 1100 06/30/17 1149  BP: (!) 182/121 (!) 164/106 (!) 155/98   Pulse: 100 90 90   Resp:  13 15   Temp:      TempSrc:    Oral  SpO2:  96% 97%   Weight:       Body mass index is 20.74 kg/m.  General:  WDWN in NAD HENT: WNL Eyes: Pupils equal Pulmonary: normal non-labored breathing , without Rales, rhonchi,  wheezing Cardiac: RRR, without  Murmurs, rubs or gallops; No carotid bruits Abdomen: soft, NT, no masses Skin: no rashes, ulcers noted;  no Gangrene , no cellulitis; no open wounds;  Vascular Exam/Pulses:palpable radial, DP pulses B. No palpable thrill in the right radial cephalic fistula.  Well healed  forearm incision. Musculoskeletal: no muscle wasting or atrophy; no edema, no thrill or bruit in R forearm, IV in L antecubital veins Neurologic: A&O X 3; Appropriate Affect ; SENSATION: normal; MOTOR FUNCTION: 5/5 Symmetric; Speech is fluent/normal Psychiatric: Judgment intact, Mood & affect appropriate for pt's clinical situation Lymph : No Cervical, Axillary, or Inguinal lymphadenopathy  Significant Diagnostic Studies: CBC Lab Results  Component Value Date   WBC 5.7 06/30/2017   HGB 7.6 (L) 06/30/2017   HCT 24.7 (L) 06/30/2017   MCV 92.5 06/30/2017   PLT 261 06/30/2017    BMET    Component Value Date/Time   NA 138 06/30/2017 0257   K 5.1 06/30/2017 0257   CL 105 06/30/2017 0257   CO2 17 (L) 06/30/2017 0257   GLUCOSE 112 (H) 06/30/2017 0257   BUN 65 (H) 06/30/2017 0257   CREATININE 7.24 (H) 06/30/2017 0257   CALCIUM 8.9 06/30/2017 0257   GFRNONAA 6 (L) 06/30/2017 0257   GFRAA 7 (L) 06/30/2017 0257   CrCl cannot be calculated (Unknown ideal weight.).  COAG Lab Results  Component Value Date   INR 1.06 07/12/2016   INR 1.42 06/28/2016     Non-Invasive Vascular Imaging:  Vein mapping Pending  ASSESSMENT/PLAN:  ESRD   Needs TDC/AVF vs AVG placement  Vein mapping pending   Roxy Horseman 06/30/2017 11:56 AM  Addendum  I have independently interviewed and examined the patient, and I agree with the physician assistant's findings.  This patient is aware she needs to start hemodialysis, but she has not come to terms with such as evident by her reluctance to have tunneled dialysis catheter placed.   Risk, benefits, and alternatives to access surgery were discussed.    The patient is aware the risks include but are not limited to: bleeding, infection, steal syndrome, nerve damage, ischemic monomelic neuropathy, thrombosis, failure to mature, complications related to venous hypertension, need for additional procedures, death and stroke.    The patient  agrees to proceed forward with the procedure.  The patient is aware the risks of tunneled dialysis catheter placement include but are not limited to: bleeding, infection, central venous injury, pneumothorax, possible venous stenosis, possible malpositioning in the venous system, and possible infections related to long-term catheter presence.   The patient is not willing to have tunneled dialysis catheter placement at this point.  Vein mapping pending  Will plan on [facility], Left arm AVF vs AVG placement on Monday unless HD needs to be started this weekend   Adele Barthel, MD, [facility] [place] Office: [phone] Pager: [phone]  06/30/2017, 4:37 PM

## 2017-06-30 NOTE — Progress Notes (Signed)
Admit: 06/29/2017 LOS: 1  41F likely now ESRD with acute hypoxic RF failure, HTN emergency  Subjective:  Weened to 2L [place] 100 SpO2 BP improved, remains on NTG gtt 1.6 L urine output Morning labs creatinine 7.24, BUN 65, potassium 5.1, bicarbonate 17 Hemoglobin 7.6 this morning Discussed that she is now ESRD, she was having progressive nausea and vomiting at home. She is agreeable to start hemodialysis, understands the need for a tunneled catheter. It appears that [facility] put a right radiocephalic AV fistula in but it is not patent at the current time, she will need a new access She says was taking pred 10 BID at home  02/20 0701 - 02/21 0700 In: 866.4 [I.V.:734.4; IV Piggyback:132] Out: 1100 [Urine:1100]  Filed Weights   06/30/17 0500  Weight: 54.8 kg (120 lb 13 oz)    Scheduled Meds: . azithromycin  500 mg Oral Daily  . felodipine  10 mg Oral Daily  . heparin  5,000 Units Subcutaneous Q8H  . hydrALAZINE  25 mg Oral Q8H  . ipratropium-albuterol  3 mL Nebulization Q6H  . labetalol  200 mg Oral BID  . mouth rinse  15 mL Mouth Rinse BID  . ondansetron (ZOFRAN) IV  4 mg Intravenous Once  . pantoprazole  40 mg Oral Daily   Continuous Infusions: . sodium chloride    . furosemide 160 mg (06/30/17 0548)  . nitroGLYCERIN 190 mcg/min (06/30/17 0828)   PRN Meds:.sodium chloride, acetaminophen, hydrALAZINE, labetalol  Current Labs: reviewed  Results for Kathleen [name], Kathleen [name] (MRN [id number]) as of 06/30/2017 08:33  Ref. Range 06/29/2017 23:20  Saturation Ratios Latest Ref Range: 10.4 - 31.8 % 13  Ferritin Latest Ref Range: 11 - 307 ng/mL 290    Physical Exam:  Blood pressure (!) 151/96, pulse 98, temperature 98.4 F (36.9 C), temperature source Oral, resp. rate 13, weight 54.8 kg (120 lb 13 oz), SpO2 100 %. GEN: [place] [address], breathing comfortably, awake, alert groggy in NAD ENT: NCAT EYES: EOMI CV: Regular rhythm, normal rate, no rub PULM: Bibasilar crackles, no wheezing ABD:  Soft, nontender SKIN: No rashes or lesions EXT: Trace peripheral edema   A 1. 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 HTN Emergency with PUlmonary Edema on BiPAP  3. SLE, quiescent it appears on MMF/Pred/Hydroxychloroquine  4. ANemia, chornic likely related to CKD and chronic disesae; features of IDA will need IV Fe and start ESA with HD 5. Ongoing tobacco user  P 1. VVS consult for [facility] and AV access 2. Start HD after access obtained 3. Do not resume MMF, cont pred at current time 4. Cont lasix for now 5. Increase hydralazine to 100 TID 6. Plan for Fe and ESA starting with HD 7. Ween NTG as able   Pearson Grippe MD 06/30/2017, 8:33 AM  Recent Labs  Lab 06/29/17 1057 06/29/17 1455 06/30/17 0257  NA 140  --  138  K 5.3*  --  5.1  CL 110  --  105  CO2 18*  --  17*  GLUCOSE 102*  --  112*  BUN 61*  --  65*  CREATININE 7.21* 7.14* 7.24*  CALCIUM 8.7*  --  8.9  PHOS  --   --  5.0*   Recent Labs  Lab 06/29/17 1057 06/30/17 0257  WBC 6.8 5.7  NEUTROABS 5.1  --   HGB 8.6* 7.6*  HCT 28.5* 24.7*  MCV 94.4 92.5  PLT 248 261

## 2017-06-30 NOTE — Consult Note (Addendum)
CONSULT NOTE   MRN : [id number]  Reason for Consult: acute ESRD Referring Physician: Dr. Joelyn Oms  History of Present Illness:   Kathleen [name] is a 57 y.o. (Sep 12, 1960) female RHD with known chronic kidney disease stage IV-V.  Pt previously had a R RC AVF that failed placed at [facility] in [place].  She was admitted with hypertensive emergency and progression into ESRD.  The patient was told by Dr. Joelyn Oms that she needs to start hemodialysis.  The patient denies any prior central venous cannulations.  She denies any permanent pacemaker placement.  She is reluctant to proceed with tunneled dialysis catheter placement.  Past medical hx: TIA,Lupus, MI, AKI/CKD, TB, and HTN.  She is right hand dominant.  Current Facility-Administered Medications  Medication Dose Route Frequency Provider Last Rate Last Dose  . 0.9 %  sodium chloride infusion  250 mL Intravenous PRN Sampson Goon, MD      . acetaminophen (TYLENOL) tablet 650 mg  650 mg Oral Q4H PRN Laverle Hobby, MD   650 mg at 06/30/17 0800  . azithromycin (ZITHROMAX) tablet 500 mg  500 mg Oral Daily Sampson Goon, MD   500 mg at 06/30/17 0912  . [START ON 07/01/2017] famotidine (PEPCID) tablet 20 mg  20 mg Oral Daily Susa Raring, [place]      . felodipine (PLENDIL) 24 hr tablet 10 mg  10 mg Oral Daily Diallo, Abdoulaye, MD   10 mg at 06/30/17 1105  . furosemide (LASIX) 160 mg in dextrose 5 % 50 mL IVPB  160 mg Intravenous Q8H Pearson Grippe B, MD 66 mL/hr at 06/30/17 0548 160 mg at 06/30/17 0548  . heparin injection 5,000 Units  5,000 Units Subcutaneous Q8H Sampson Goon, MD   5,000 Units at 06/30/17 0606  . hydrALAZINE (APRESOLINE) injection 10-40 mg  10-40 mg Intravenous Q4H PRN Rigoberto Noel, MD      . hydrALAZINE (APRESOLINE) tablet 50 mg  50 mg Oral Q8H Sanford, Ryan B, MD      . hydroxychloroquine (PLAQUENIL) tablet 200 mg  200 mg Oral Daily Diallo, Abdoulaye, MD   200 mg at 06/30/17 1106  .  ipratropium-albuterol (DUONEB) 0.5-2.5 (3) MG/3ML nebulizer solution 3 mL  3 mL Nebulization Q6H Sampson Goon, MD   3 mL at 06/30/17 0311  . labetalol (NORMODYNE) tablet 200 mg  200 mg Oral BID Diallo, Abdoulaye, MD   200 mg at 06/30/17 0912  . labetalol (NORMODYNE,TRANDATE) injection 10-20 mg  10-20 mg Intravenous Q2H PRN Rigoberto Noel, MD      . MEDLINE mouth rinse  15 mL Mouth Rinse BID Sampson Goon, MD   15 mL at 06/30/17 0912  . mycophenolate (CELLCEPT) capsule 1,000 mg  1,000 mg Oral BID Diallo, Abdoulaye, MD      . nitroGLYCERIN 50 mg in dextrose 5 % 250 mL (0.2 mg/mL) infusion  0-200 mcg/min Intravenous Titrated Sampson Goon, MD 54 mL/hr at 06/30/17 1108 180 mcg/min at 06/30/17 1108  . ondansetron (ZOFRAN) injection 4 mg  4 mg Intravenous Once Drenda Freeze, MD   Stopped at 06/29/17 1000    Pt meds include: Statin :No Betablocker: Yes ASA: No Other anticoagulants/antiplatelets: none  Past Medical History:  Diagnosis Date  . Acute renal failure (ARF) ([place])   . Anemia   . Arthritis   . Cardiac arrest ([place])   . Cellulitis   . Cerebral vasculitis   . COPD (chronic obstructive pulmonary disease) (  HCC)   . Elevated antinuclear antibody (ANA) level   . Endometriosis   . Gallstones   . Hypertension   . Lupus   . Migraine   . PONV (postoperative nausea and vomiting)   . Seizures ([place])   . Sepsis ([place])   . Suicide attempt ([place])   . TB (tuberculosis)   . Transient ischemic attack (TIA)   . Vasculitis of skin     Past Surgical History:  Procedure Laterality Date  . CESAREAN SECTION    . ESOPHAGOGASTRODUODENOSCOPY N/A 07/15/2016   Procedure: ESOPHAGOGASTRODUODENOSCOPY (EGD);  Surgeon: Laurence Spates, MD;  Location[facility] ENDOSCOPY;  Service: Endoscopy;  Laterality: N/A;  . TUBAL LIGATION      Social History Social History   Tobacco Use  . Smoking status: Current Some Day Smoker    Packs/day: 0.50    Types: Cigarettes  . Smokeless tobacco: Never Used  Substance  Use Topics  . Alcohol use: No  . Drug use: No    Family History History reviewed. No pertinent family history.  unknown  No Known Allergies   REVIEW OF SYSTEMS  General: [ ]  Weight loss, [ ]  Fever, [ ]  chills Neurologic: [ ]  Dizziness, [ ]  Blackouts, [ ]  Seizure [ ]  Stroke, [ ]  "Mini stroke", [ ]  Slurred speech, [ ]  Temporary blindness; [ ]  weakness in arms or legs, [ ]  Hoarseness [ ]  Dysphagia Cardiac: [ ]  Chest pain/pressure, [ ]  Shortness of breath at rest [ ]  Shortness of breath with exertion, [ ]  Atrial fibrillation or irregular heartbeat  Vascular: [ ]  Pain in legs with walking, [ ]  Pain in legs at rest, [ ]  Pain in legs at night,  [ ]  Non-healing ulcer, [ ]  Blood clot in vein/DVT,   Pulmonary: [ ]  Home oxygen, [ ]  Productive cough, [ ]  Coughing up blood, [ ]  Asthma,  [ ]  Wheezing [ ]  COPD Musculoskeletal:  [x ] Arthritis, [ ]  Low back pain, [x ] Joint pain Hematologic: [ ]  Easy Bruising, [x ] Anemia; [ ]  Hepatitis Gastrointestinal: [ ]  Blood in stool, [ ]  Gastroesophageal Reflux/heartburn, [x]  N/V Urinary: [x ] chronic Kidney disease, [ ]  on HD - [ ]  MWF or [ ]  TTHS, [ ]  Burning with urination, [ ]  Difficulty urinating Skin: [ ]  Rashes, [ ]  Wounds Psychological: [ ]  Anxiety, [ ]  Depression  Physical Examination Vitals:   06/30/17 0912 06/30/17 1000 06/30/17 1100 06/30/17 1149  BP: (!) 182/121 (!) 164/106 (!) 155/98   Pulse: 100 90 90   Resp:  13 15   Temp:      TempSrc:    Oral  SpO2:  96% 97%   Weight:       Body mass index is 20.74 kg/m.  General:  WDWN in NAD HENT: WNL Eyes: Pupils equal Pulmonary: normal non-labored breathing , without Rales, rhonchi,  wheezing Cardiac: RRR, without  Murmurs, rubs or gallops; No carotid bruits Abdomen: soft, NT, no masses Skin: no rashes, ulcers noted;  no Gangrene , no cellulitis; no open wounds;  Vascular Exam/Pulses:palpable radial, DP pulses B. No palpable thrill in the right radial cephalic fistula.  Well healed  forearm incision. Musculoskeletal: no muscle wasting or atrophy; no edema, no thrill or bruit in R forearm, IV in L antecubital veins Neurologic: A&O X 3; Appropriate Affect ; SENSATION: normal; MOTOR FUNCTION: 5/5 Symmetric; Speech is fluent/normal Psychiatric: Judgment intact, Mood & affect appropriate for pt's clinical situation Lymph : No Cervical, Axillary, or Inguinal lymphadenopathy  Significant Diagnostic Studies: CBC Lab Results  Component Value Date   WBC 5.7 06/30/2017   HGB 7.6 (L) 06/30/2017   HCT 24.7 (L) 06/30/2017   MCV 92.5 06/30/2017   PLT 261 06/30/2017    BMET    Component Value Date/Time   NA 138 06/30/2017 0257   K 5.1 06/30/2017 0257   CL 105 06/30/2017 0257   CO2 17 (L) 06/30/2017 0257   GLUCOSE 112 (H) 06/30/2017 0257   BUN 65 (H) 06/30/2017 0257   CREATININE 7.24 (H) 06/30/2017 0257   CALCIUM 8.9 06/30/2017 0257   GFRNONAA 6 (L) 06/30/2017 0257   GFRAA 7 (L) 06/30/2017 0257   CrCl cannot be calculated (Unknown ideal weight.).  COAG Lab Results  Component Value Date   INR 1.06 07/12/2016   INR 1.42 06/28/2016     Non-Invasive Vascular Imaging:  Vein mapping Pending  ASSESSMENT/PLAN:  ESRD   Needs TDC/AVF vs AVG placement  Vein mapping pending   Kathleen [name] 06/30/2017 11:56 AM  Addendum  I have independently interviewed and examined the patient, and I agree with the physician assistant's findings.  This patient is aware she needs to start hemodialysis, but she has not come to terms with such as evident by her reluctance to have tunneled dialysis catheter placed.   Risk, benefits, and alternatives to access surgery were discussed.    The patient is aware the risks include but are not limited to: bleeding, infection, steal syndrome, nerve damage, ischemic monomelic neuropathy, thrombosis, failure to mature, complications related to venous hypertension, need for additional procedures, death and stroke.    The patient  agrees to proceed forward with the procedure.  The patient is aware the risks of tunneled dialysis catheter placement include but are not limited to: bleeding, infection, central venous injury, pneumothorax, possible venous stenosis, possible malpositioning in the venous system, and possible infections related to long-term catheter presence.   The patient is not willing to have tunneled dialysis catheter placement at this point.  Vein mapping pending  Will plan on [facility], Left arm AVF vs AVG placement on Monday unless HD needs to be started this weekend   Adele Barthel, MD, [facility] [place] Office: [phone] Pager: [phone]  06/30/2017, 4:37 PM

## 2017-07-01 ENCOUNTER — Inpatient Hospital Stay (HOSPITAL_COMMUNITY): Payer: Medicare Other

## 2017-07-01 DIAGNOSIS — I10 Essential (primary) hypertension: Secondary | ICD-10-CM

## 2017-07-01 DIAGNOSIS — I739 Peripheral vascular disease, unspecified: Secondary | ICD-10-CM

## 2017-07-01 LAB — GLUCOSE, CAPILLARY: GLUCOSE-CAPILLARY: 116 mg/dL — AB (ref 65–99)

## 2017-07-01 LAB — PARATHYROID HORMONE, INTACT (NO CA): PTH: 642 pg/mL — AB (ref 15–65)

## 2017-07-01 MED ORDER — SODIUM CHLORIDE 0.9 % IV SOLN
250.0000 mg | Freq: Every day | INTRAVENOUS | Status: AC
  Administered 2017-07-01 – 2017-07-03 (×3): 250 mg via INTRAVENOUS
  Filled 2017-07-01 (×6): qty 20

## 2017-07-01 MED ORDER — GABAPENTIN 300 MG PO CAPS
300.0000 mg | ORAL_CAPSULE | Freq: Every day | ORAL | Status: DC
  Administered 2017-07-01 – 2017-07-07 (×7): 300 mg via ORAL
  Filled 2017-07-01 (×7): qty 1

## 2017-07-01 MED ORDER — FUROSEMIDE 10 MG/ML IJ SOLN
120.0000 mg | Freq: Two times a day (BID) | INTRAVENOUS | Status: DC
  Administered 2017-07-01: 120 mg via INTRAVENOUS
  Filled 2017-07-01 (×2): qty 12

## 2017-07-01 MED ORDER — DARBEPOETIN ALFA 60 MCG/0.3ML IJ SOSY
60.0000 ug | PREFILLED_SYRINGE | INTRAMUSCULAR | Status: DC
  Administered 2017-07-01: 60 ug via SUBCUTANEOUS
  Filled 2017-07-01: qty 0.3

## 2017-07-01 NOTE — Progress Notes (Addendum)
PROGRESS NOTE  Kathleen [name] WRU:[id number] DOB: [birth year] DOA: 06/29/2017 PCP: System, Pcp Not In  Brief Narrative: 30yow PMH lupus, CKD stage V presented with progressive SOB. Admitted by PCCM for hypertensive crisis, acute resp failure on BIPAP. Treated with BiPAP, NTG infusion. Seen by nephrology, dx with new ESRD with recommendation to initiate HD.  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 Wall thickness was   increased in a pattern of mild LVH. Systolic function was mildly   reduced. The estimated ejection fraction was in the range of 45%   to 50%. Diffuse hypokinesis. Features are consistent with a   pseudonormal left ventricular filling pattern, with concomitant   abnormal relaxation and increased filling pressure (grade 2   diastolic dysfunction). Doppler parameters are consistent with   high ventricular filling pressure. - Aortic valve: There was moderate regurgitation. - Mitral valve: There was mild regurgitation. - Left atrium: The atrium was severely dilated. - Right atrium: The atrium was mildly  dilated.  Impressions:  - Low normal to mildly reduced LV systolic function; mild LVH;   moderate diastolic dysfunction; moderate AI; mildly dilated   aortic root (44 mm); suggest CTA or MRA to further assess; mild   MR; biatrial enlargement.  Antimicrobials:  Azithromycin 2/20 >>   Interval history/Subjective: Feels well.  Objective: Vitals:  Vitals:   07/01/17 1325 07/01/17 1503  BP: 108/72 104/73  Pulse:  80  Resp:  20  Temp:  97.7 F (36.5 C)  SpO2:  100%    Exam:  Constitutional:  . Appears calm and comfortable Respiratory:  . CTA bilaterally, no w/r/r.  . Respiratory effort normal Cardiovascular:  . RRR, no m/r/g . No LE extremity edema   Psychiatric:  . Mental status o Mood, affect appropriate  I have personally reviewed the following:   Labs:  None today  Scheduled Meds: . azithromycin  500 mg Oral Daily  . darbepoetin (ARANESP) injection - NON-DIALYSIS  60 mcg Subcutaneous Q Fri-1800  . famotidine  20 mg Oral Daily  . felodipine  10 mg Oral Daily  . gabapentin  300 mg Oral Daily  . heparin  5,000 Units Subcutaneous Q8H  . hydrALAZINE  50 mg Oral Q8H  . hydroxychloroquine  200 mg Oral Daily  . ipratropium-albuterol  3 mL Nebulization TID  . labetalol  200 mg Oral BID  . mouth rinse  15 mL Mouth Rinse BID  . mycophenolate  1,000 mg Oral BID  . ondansetron (ZOFRAN) IV  4 mg Intravenous Once   Continuous Infusions: . sodium chloride    . ferric gluconate (FERRLECIT/NULECIT) IV 250 mg (07/01/17 1538)  . furosemide      Principal Problem:   Stage 5 chronic kidney disease  not on chronic dialysis [facility]) Active Problems:   Hypertension   Hypertensive crisis   Benign essential HTN   LOS: 2 days

## 2017-07-01 NOTE — Progress Notes (Signed)
Bilateral Upper Extremity Vein Map  Right  Upper Extremity Vein Map  Cephalic  Segment Diameter Depth Comment  1. Axilla 2.4 mm 3.7 mm   2. Mid upper arm 1.2 mm 2.7 mm   3. Above AC 1.5 mm 8.9 mm   4. In AC 1.9 mm 12 mm   5. Below AC 3.7 mm 5.9 mm   6. Mid forearm 2.5 mm 2.1 mm   7. Wrist 1.6 mm 2.2 mm Branch   Basilic  Segment Diameter Depth Comment  1. Axilla 4.9 mm 3.1 mm   2. Mid upper arm 3.4 mm 6.1 mm   3. Above AC 3.7 mm 5.4 mm Branch  4. In [facility] 2 mm 1.8 mm   5. Below AC 1.4 mm 1.5 mm Branch  6. Mid forearm 1.2 mm 1.2 mm   7. Wrist 1 mm 1.7 mm     Left Upper Extremity Vein Map  Cephalic  Segment Diameter Depth Comment  1. Axilla 3.5 mm 5.3 mm   2. Mid upper arm 2.9 mm 2.9 mm Branch  3. Above AC 3.3 mm 2.9 mm   4. In AC 3.4 mm 3.3 mm   5. Below AC 1.5 mm 6.6 mm Branch  6. Mid forearm 1.6 mm 2.9 mm   7. Wrist 2.1 mm 2.1 mm    Basilic  Segment Diameter Depth Comment  1. Axilla 6.1 mm 2.6 mm   2. Mid upper arm 3.9 mm 4.8 mm   3. Above AC 3.1 mm 4.2 mm   4. In AC 1.7 mm 3 mm Branch  5. Below AC 2.7 mm 2.5 mm   6. Mid forearm 1.1 mm 2.5 mm   7. Wrist 0.1 mm 2.2 mm    07/01/17 1:42 PM Carlos Levering RVT

## 2017-07-01 NOTE — Progress Notes (Signed)
Admit: 06/29/2017 LOS: 2  58F likely now ESRD with acute hypoxic RF failure, HTN emergency  Subjective:  BP improved Now on RA Excellent UOP on IV lasix Denies anorexia, dysgeusia, N/V Still willing to start HD if needed and wait for AV access Off IV NTG gtt Seen by VVS for [facility] and AV acess 2/25  02/21 0701 - 02/22 0700 In: 369.3 [I.V.:303.3; IV Piggyback:66] Out: 2800 [Urine:2800]  Filed Weights   06/30/17 0500 06/30/17 2012 07/01/17 0500  Weight: 54.8 kg (120 lb 13 oz) 55.5 kg (122 lb 5.7 oz) 53.7 kg (118 lb 6.2 oz)    Scheduled Meds: . azithromycin  500 mg Oral Daily  . famotidine  20 mg Oral Daily  . felodipine  10 mg Oral Daily  . gabapentin  300 mg Oral Daily  . heparin  5,000 Units Subcutaneous Q8H  . hydrALAZINE  50 mg Oral Q8H  . hydroxychloroquine  200 mg Oral Daily  . ipratropium-albuterol  3 mL Nebulization TID  . labetalol  200 mg Oral BID  . mouth rinse  15 mL Mouth Rinse BID  . mycophenolate  1,000 mg Oral BID  . ondansetron (ZOFRAN) IV  4 mg Intravenous Once   Continuous Infusions: . sodium chloride    . furosemide Stopped (06/30/17 2322)   PRN Meds:.sodium chloride, acetaminophen, hydrALAZINE, labetalol  Current Labs: reviewed  Results for Kathleen, [name] (MRN [id number]) as of 06/30/2017 08:33  Ref. Range 06/29/2017 23:20  Saturation Ratios Latest Ref Range: 10.4 - 31.8 % 13  Ferritin Latest Ref Range: 11 - 307 ng/mL 290    Physical Exam:  Blood pressure 92/63, pulse 82, temperature 98.2 F (36.8 C), temperature source Oral, resp. rate 14, height 5\' 4"  (1.626 m), weight 53.7 kg (118 lb 6.2 oz), SpO2 100 %. GEN: On [place], breathing comfortably, awake, alert groggy in NAD ENT: NCAT EYES: EOMI CV: Regular rhythm, normal rate, no rub PULM: Bibasilar crackles, no wheezing ABD: Soft, nontender SKIN: No rashes or lesions EXT: Trace peripheral edema   A 1. Progressive CKD5 now near ESRD 2/2 SLE nephritis, 07/2016 Bx w/o severe scarring and no  activity; not very uremic; volume control primary issue 2. HTN Emergency with PUlmonary Edema on BiPAP; resolved with diuresis and BP control 3. SLE, quiescent it appears on MMF/Pred/Hydroxychloroquine  4. ANemia, chornic likely related to CKD and chronic disesae; features of IDA will need IV Fe and start ESA with HD 5. Ongoing tobacco user  P 1. If remains w/o uremia and stable labs would only do AVF on 2/25 and not TDC 2. She is willing to closely f/u in office if postpones start of HD 3. Reduce lasix to 120 IV BID 4. Cont other BP meds 5. Give Aranesp 60 today 6. Start Venofer for over weekend 7. Daily weights, Daily Renal Panel, Strict I/Os, Avoid nephrotoxins (NSAIDs, judicious IV Contrast) 8. Follow  Closely 9. [place] PTH  Kathleen Grippe MD 07/01/2017, 12:41 PM  Recent Labs  Lab 06/29/17 1057 06/29/17 1455 06/30/17 0257  NA 140  --  138  K 5.3*  --  5.1  CL 110  --  105  CO2 18*  --  17*  GLUCOSE 102*  --  112*  BUN 61*  --  65*  CREATININE 7.21* 7.14* 7.24*  CALCIUM 8.7*  --  8.9  PHOS  --   --  5.0*   Recent Labs  Lab 06/29/17 1057 06/30/17 0257  WBC 6.8 5.7  NEUTROABS 5.1  --  HGB 8.6* 7.6*  HCT 28.5* 24.7*  MCV 94.4 92.5  PLT 248 261

## 2017-07-01 NOTE — Care Management Note (Addendum)
Case Management Note  Patient Details  Name: Kathleen [name] MRN: [id number] Date of Birth: 1960/06/02  Subjective/Objective:   From home with boyfriend, pta indep, she states she has a rolling walker at home. She will need a bus pass at discharge.  She has PCP , Lady Gary with [facility], her apt is scheduled for 3/4 at 9:30.  2/27 [place], BSN- NCM spoke with Adela Lank in HD,she states patient will have HD at [facility] on Saturday, but she will need transport set up for [place] and she will need to be there on Friday to go fill out paper work from [place] to 3 pm.  Per [place] in HD, [place] [facility] will pick patient up at 11:30 every Sat, Tues and Thur for HD, but she will also need to be at HD center to fill out paperwork between 8am and 3 pm on Friday. This information was given to patient and her sign other in the room.                     Action/Plan: NCM will follow for dc needs.   Expected Discharge Date:                  Expected Discharge Plan:  Home/Self Care  In-House Referral:     Discharge planning Services  CM Consult  Post Acute Care Choice:    Choice offered to:     DME Arranged:    DME Agency:     HH Arranged:    HH Agency:     Status of Service:  In process, will continue to follow  If discussed at Long Length of Stay Meetings, dates discussed:    Additional Comments:  Zenon Mayo, RN 07/01/2017, 3:36 PM

## 2017-07-02 LAB — RENAL FUNCTION PANEL
ALBUMIN: 3.4 g/dL — AB (ref 3.5–5.0)
ANION GAP: 19 — AB (ref 5–15)
BUN: 76 mg/dL — ABNORMAL HIGH (ref 6–20)
CALCIUM: 8.6 mg/dL — AB (ref 8.9–10.3)
CO2: 17 mmol/L — ABNORMAL LOW (ref 22–32)
Chloride: 98 mmol/L — ABNORMAL LOW (ref 101–111)
Creatinine, Ser: 7.8 mg/dL — ABNORMAL HIGH (ref 0.44–1.00)
GFR, EST AFRICAN AMERICAN: 6 mL/min — AB (ref >60)
GFR, EST NON AFRICAN AMERICAN: 5 mL/min — AB (ref >60)
Glucose, Bld: 87 mg/dL (ref 65–99)
PHOSPHORUS: 5.9 mg/dL — AB (ref 2.5–4.6)
POTASSIUM: 5.1 mmol/L (ref 3.5–5.1)
SODIUM: 134 mmol/L — AB (ref 135–145)

## 2017-07-02 MED ORDER — FUROSEMIDE 40 MG PO TABS
120.0000 mg | ORAL_TABLET | Freq: Two times a day (BID) | ORAL | Status: DC
  Administered 2017-07-02 – 2017-07-03 (×4): 120 mg via ORAL
  Filled 2017-07-02 (×4): qty 1

## 2017-07-02 MED ORDER — CALCITRIOL 0.25 MCG PO CAPS
0.2500 ug | ORAL_CAPSULE | Freq: Every day | ORAL | Status: DC
  Administered 2017-07-02 – 2017-07-04 (×3): 0.25 ug via ORAL
  Filled 2017-07-02 (×3): qty 1

## 2017-07-02 NOTE — Progress Notes (Signed)
Admit: 06/29/2017 LOS: 3  89F likely now ESRD with acute hypoxic RF failure, HTN emergency  Subjective:  SCr inc to 7.8, K 4.1, HCO3 17 BP improved Remains on RA on IV lasix Vein mapping yesterday Cont to denie N/V, anorexia; tol diet  02/22 0701 - 02/23 0700 In: 1142 [P.O.:960; IV Piggyback:182] Out: 850 [Urine:850]  Filed Weights   06/30/17 2012 07/01/17 0500 07/02/17 0554  Weight: 55.5 kg (122 lb 5.7 oz) 53.7 kg (118 lb 6.2 oz) 53.7 kg (118 lb 6.2 oz)    Scheduled Meds: . azithromycin  500 mg Oral Daily  . darbepoetin (ARANESP) injection - NON-DIALYSIS  60 mcg Subcutaneous Q Fri-1800  . famotidine  20 mg Oral Daily  . felodipine  10 mg Oral Daily  . gabapentin  300 mg Oral Daily  . heparin  5,000 Units Subcutaneous Q8H  . hydrALAZINE  50 mg Oral Q8H  . hydroxychloroquine  200 mg Oral Daily  . ipratropium-albuterol  3 mL Nebulization TID  . labetalol  200 mg Oral BID  . mouth rinse  15 mL Mouth Rinse BID  . mycophenolate  1,000 mg Oral BID  . ondansetron (ZOFRAN) IV  4 mg Intravenous Once   Continuous Infusions: . sodium chloride    . ferric gluconate (FERRLECIT/NULECIT) IV Stopped (07/01/17 1638)  . furosemide Stopped (07/01/17 1900)   PRN Meds:.sodium chloride, acetaminophen, hydrALAZINE, labetalol  Current Labs: reviewed  Results for EVIE, CROSTON (MRN [id number]) as of 06/30/2017 08:33  Ref. Range 06/29/2017 23:20  Saturation Ratios Latest Ref Range: 10.4 - 31.8 % 13  Ferritin Latest Ref Range: 11 - 307 ng/mL 290  * Results for ALONIE, GAZZOLA (MRN [id number]) as of 07/02/2017 06:52  Ref. Range 06/29/2017 23:20  PTH, Intact Latest Ref Range: 15 - 65 pg/mL 642 (H)    Physical Exam:  Blood pressure 117/80, pulse 90, temperature 98.9 F (37.2 C), temperature source Oral, resp. rate 18, height 5\' 4"  (1.626 m), weight 53.7 kg (118 lb 6.2 oz), SpO2 100 %. GEN: On [place], breathing comfortably, awake, alert groggy in NAD ENT: NCAT EYES: EOMI CV: Regular rhythm,  normal rate, no rub PULM: Bibasilar crackles, no wheezing ABD: Soft, nontender SKIN: No rashes or lesions EXT: Trace peripheral edema   A 1. Progressive CKD5 now near ESRD 2/2 SLE nephritis, 07/2016 Bx w/o severe scarring and no activity; not very uremic; volume control primary issue 2. HTN Emergency with Pulmonary Edema on BiPAP; resolved with diuresis and BP control 3. SLE, quiescent it appears on MMF/Pred/Hydroxychloroquine  4. ANemia, chornic likely related to CKD and chronic disesae; features of IDA rec IVFe s/p aranesp 2/22 5. Ongoing tobacco user 6. BMD, 2HPTH; P ok  P 1. If remains w/o uremia and stable labs would only do AVF on 2/25 and not TDC 2. She is willing to closely f/u in office if postpones start of HD 3. Change to PO lasix 120 PO BID see if can maintain volume status 4. Cont other BP meds 5. Cont IV Fe load;  6. Start calcitriol 0.4mcg daily for now 7. Daily weights, Daily Renal Panel, Strict I/Os, Avoid nephrotoxins (NSAIDs, judicious IV Contrast)   Pearson Grippe MD 07/02/2017, 6:52 AM  Recent Labs  Lab 06/29/17 1057 06/29/17 1455 06/30/17 0257 07/02/17 0245  NA 140  --  138 134*  K 5.3*  --  5.1 5.1  CL 110  --  105 98*  CO2 18*  --  17* 17*  GLUCOSE 102*  --  112*  87  BUN 61*  --  65* 76*  CREATININE 7.21* 7.14* 7.24* 7.80*  CALCIUM 8.7*  --  8.9 8.6*  PHOS  --   --  5.0* 5.9*   Recent Labs  Lab 06/29/17 1057 06/30/17 0257  WBC 6.8 5.7  NEUTROABS 5.1  --   HGB 8.6* 7.6*  HCT 28.5* 24.7*  MCV 94.4 92.5  PLT 248 261

## 2017-07-02 NOTE — Progress Notes (Signed)
PROGRESS NOTE  Kathleen [name] GGY:[id number] DOB: 05/19/1960 DOA: 06/29/2017 PCP: System, Pcp Not In  Brief Narrative: 47yow PMH lupus, CKD stage V presented with progressive SOB. Admitted by PCCM for hypertensive crisis, acute resp failure on BIPAP. Treated with BiPAP, NTG infusion. Seen by nephrology, dx with new ESRD with recommendation to initiate HD.  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   -Management per nephrology, patient has been changed to oral diuretics, volume status being followed.  Hypertensive emergency  -Resolved with diuresis and antihypertensives  Essential HTN -Remains stable, continue labetalol, hydralazine, felodipine   Acute resp failure secondary to above, acute diastolic CHF, treated with BiPAP and diuresis -Resolved.  Anemia of CKD, chronic disease - per nephrology   Anticipate discharge when cleared by nephrology  Telemetry sinus rhythm.  Discontinue telemetry.  DVT prophylaxis: heparin Code Status: full Family Communication: none Disposition Plan: home    [name], MD  [facility] Direct contact: [phone] --Via [place]  --[email or web address]; password [email or web address]  7PM-7AM contact night coverage as above 07/02/2017, 10:18 AM  LOS: 3 days   Consultants:  PCCM  Nephrology  VVS  Procedures:  Echo Study Conclusions  - Left ventricle: The cavity size was normal. Wall thickness was   increased in a pattern of mild LVH. Systolic function was mildly   reduced. The estimated ejection fraction was in the range of 45%   to 50%. Diffuse hypokinesis. Features are consistent with a   pseudonormal left ventricular filling pattern, with concomitant   abnormal relaxation and increased filling pressure (grade 2   diastolic dysfunction). Doppler parameters are consistent with   high ventricular filling pressure. -  Aortic valve: There was moderate regurgitation. - Mitral valve: There was mild regurgitation. - Left atrium: The atrium was severely dilated. - Right atrium: The atrium was mildly dilated.  Impressions:  - Low normal to mildly reduced LV systolic function; mild LVH;   moderate diastolic dysfunction; moderate AI; mildly dilated   aortic root (44 mm); suggest CTA or MRA to further assess; mild   MR; biatrial enlargement.  Antimicrobials:  Azithromycin 2/20 >> 2/24  Interval history/Subjective: Breathing okay.  Main complaint is cold food and coffee.  Objective: Vitals:  Vitals:   07/02/17 0554 07/02/17 0745  BP: 117/80   Pulse: 90   Resp: 18   Temp: 98.9 F (37.2 C)   SpO2: 100% 100%    Exam:  Constitutional:   . Appears calm and comfortable sitting on the side of the bed Respiratory:  . CTA bilaterally, no w/r/r.  . Respiratory effort normal Cardiovascular:  . RRR, no m/r/g . No LE extremity edema   Psychiatric:  . Mental status o Mood, affect appropriate  I have personally reviewed the following:  Urine output 850+ Labs:  Potassium stable at 5.1, BUN up to 76, creatinine up to 7.8  Scheduled Meds: . azithromycin  500 mg Oral Daily  . calcitRIOL  0.25 mcg Oral Daily  . darbepoetin (ARANESP) injection - NON-DIALYSIS  60 mcg Subcutaneous Q Fri-1800  . famotidine  20 mg Oral Daily  . felodipine  10 mg Oral Daily  . furosemide  120 mg Oral BID  . gabapentin  300 mg Oral Daily  . heparin  5,000 Units Subcutaneous Q8H  . hydrALAZINE  50 mg Oral Q8H  . hydroxychloroquine  200 mg Oral Daily  . ipratropium-albuterol  3 mL Nebulization TID  .  labetalol  200 mg Oral BID  . mouth rinse  15 mL Mouth Rinse BID  . mycophenolate  1,000 mg Oral BID  . ondansetron (ZOFRAN) IV  4 mg Intravenous Once   Continuous Infusions: . sodium chloride    . ferric gluconate (FERRLECIT/NULECIT) IV Stopped (07/01/17 1638)    Principal Problem:   Stage 5 chronic kidney  disease not on chronic dialysis ([place]) Active Problems:   Hypertension   Hypertensive crisis   Benign essential HTN   LOS: 3 days

## 2017-07-02 NOTE — Progress Notes (Signed)
  Progress Note    07/02/2017 10:41 AM * No surgery date entered *  Subjective:  Has questions regarding surgery  Vitals:   07/02/17 0554 07/02/17 0745  BP: 117/80   Pulse: 90   Resp: 18   Temp: 98.9 F (37.2 C)   SpO2: 100% 100%    Physical Exam: aaox3 Non labored respirations   CBC    Component Value Date/Time   WBC 5.7 06/30/2017 0257   RBC 2.67 (L) 06/30/2017 0257   HGB 7.6 (L) 06/30/2017 0257   HCT 24.7 (L) 06/30/2017 0257   PLT 261 06/30/2017 0257   MCV 92.5 06/30/2017 0257   MCH 28.5 06/30/2017 0257   MCHC 30.8 06/30/2017 0257   RDW 14.5 06/30/2017 0257   LYMPHSABS 1.4 06/29/2017 1057   MONOABS 0.2 06/29/2017 1057   EOSABS 0.1 06/29/2017 1057   BASOSABS 0.0 06/29/2017 1057    BMET    Component Value Date/Time   NA 134 (L) 07/02/2017 0245   K 5.1 07/02/2017 0245   CL 98 (L) 07/02/2017 0245   CO2 17 (L) 07/02/2017 0245   GLUCOSE 87 07/02/2017 0245   BUN 76 (H) 07/02/2017 0245   CREATININE 7.80 (H) 07/02/2017 0245   CALCIUM 8.6 (L) 07/02/2017 0245   GFRNONAA 5 (L) 07/02/2017 0245   GFRAA 6 (L) 07/02/2017 0245    INR    Component Value Date/Time   INR 1.06 07/12/2016 0522     Intake/Output Summary (Last 24 hours) at 07/02/2017 1041 Last data filed at 07/02/2017 0600 Gross per 24 hour  Intake 1022 ml  Output 850 ml  Net 172 ml     Assessment/plan:  57 y.o. female is here with ckd and htn. htn now controlled and will plan access in left arm this Monday. Decision on tdc pending. Npo past midnight tomorrow. Needs iv in right arm and remove from left.     Ahsley Attwood C. Donzetta Matters, MD [facility] [place] Office: [phone] Pager: [phone]  07/02/2017 10:41 AM

## 2017-07-03 LAB — CBC
HEMATOCRIT: 28.8 % — AB (ref 36.0–46.0)
Hemoglobin: 9.1 g/dL — ABNORMAL LOW (ref 12.0–15.0)
MCH: 29.3 pg (ref 26.0–34.0)
MCHC: 31.6 g/dL (ref 30.0–36.0)
MCV: 92.6 fL (ref 78.0–100.0)
Platelets: 275 10*3/uL (ref 150–400)
RBC: 3.11 MIL/uL — AB (ref 3.87–5.11)
RDW: 14.8 % (ref 11.5–15.5)
WBC: 4.6 10*3/uL (ref 4.0–10.5)

## 2017-07-03 LAB — RENAL FUNCTION PANEL
Albumin: 3.6 g/dL (ref 3.5–5.0)
Anion gap: 18 — ABNORMAL HIGH (ref 5–15)
BUN: 86 mg/dL — ABNORMAL HIGH (ref 6–20)
CHLORIDE: 97 mmol/L — AB (ref 101–111)
CO2: 18 mmol/L — AB (ref 22–32)
Calcium: 8.9 mg/dL (ref 8.9–10.3)
Creatinine, Ser: 8.71 mg/dL — ABNORMAL HIGH (ref 0.44–1.00)
GFR, EST AFRICAN AMERICAN: 5 mL/min — AB (ref >60)
GFR, EST NON AFRICAN AMERICAN: 5 mL/min — AB (ref >60)
Glucose, Bld: 97 mg/dL (ref 65–99)
POTASSIUM: 5.1 mmol/L (ref 3.5–5.1)
Phosphorus: 6.4 mg/dL — ABNORMAL HIGH (ref 2.5–4.6)
Sodium: 133 mmol/L — ABNORMAL LOW (ref 135–145)

## 2017-07-03 LAB — PARATHYROID HORMONE, INTACT (NO CA): PTH: 765 pg/mL — ABNORMAL HIGH (ref 15–65)

## 2017-07-03 MED ORDER — CEFAZOLIN SODIUM-DEXTROSE 1-4 GM/50ML-% IV SOLN
1.0000 g | INTRAVENOUS | Status: AC
  Administered 2017-07-04: 1 g via INTRAVENOUS
  Filled 2017-07-03 (×2): qty 50

## 2017-07-03 NOTE — Plan of Care (Signed)
Discussed with patient about plan of care for the evening, pain management and nutritional status with being NPO at midnight for a procedure with some teach back displayed.

## 2017-07-03 NOTE — Anesthesia Preprocedure Evaluation (Addendum)
Anesthesia Evaluation  Patient identified by MRN, date of birth, ID band Patient awake    Reviewed: Allergy & Precautions, NPO status , Patient's Chart, lab work & pertinent test results, reviewed documented beta blocker date and time   History of Anesthesia Complications (+) PONV and history of anesthetic complications  Airway Mallampati: I       Dental  (+) Upper Dentures, Dental Advisory Given   Pulmonary COPD,  COPD inhaler, Current Smoker,    breath sounds clear to auscultation       Cardiovascular hypertension, Pt. on medications and Pt. on home beta blockers + Peripheral Vascular Disease   Rhythm:Regular Rate:Normal  Echo 2/19  - Left ventricle: The cavity size was normal. Wall thickness was   increased in a pattern of mild LVH. Systolic function was mildly   reduced. The estimated ejection fraction was in the range of 45%   to 50%. Diffuse hypokinesis. Features are consistent with a   pseudonormal left ventricular filling pattern, with concomitant   abnormal relaxation and increased filling pressure (grade 2   diastolic dysfunction). Doppler parameters are consistent with   high ventricular filling pressure. - Aortic valve: There was moderate regurgitation. - Mitral valve: There was mild regurgitation. - Left atrium: The atrium was severely dilated. - Right atrium: The atrium was mildly dilated.   Neuro/Psych  Headaches, Seizures -,  TIAnegative psych ROS   GI/Hepatic negative GI ROS, Neg liver ROS,   Endo/Other  negative endocrine ROS  Renal/GU CRFRenal disease  negative genitourinary   Musculoskeletal  (+) Arthritis ,   Abdominal   Peds negative pediatric ROS (+)  Hematology negative hematology ROS (+) anemia ,   Anesthesia Other Findings   Reproductive/Obstetrics negative OB ROS                           Anesthesia Physical  Anesthesia Plan  ASA: III  Anesthesia  Plan: MAC   Post-op Pain Management:    Induction: Intravenous  PONV Risk Score and Plan: 2 and Ondansetron and Treatment may vary due to age or medical condition  Airway Management Planned: Natural Airway and Nasal Cannula  Additional Equipment:   Intra-op Plan:   Post-operative Plan:   Informed Consent: I have reviewed the patients History and Physical, chart, labs and discussed the procedure including the risks, benefits and alternatives for the proposed anesthesia with the patient or authorized representative who has indicated his/her understanding and acceptance.     Plan Discussed with: CRNA and Anesthesiologist  Anesthesia Plan Comments:         Anesthesia Quick Evaluation

## 2017-07-03 NOTE — Progress Notes (Signed)
Admit: 06/29/2017 LOS: 4  65F likely now ESRD with acute hypoxic RF failure, HTN emergency  Subjective:  No new issues Remains on RA Discussed starting HD now vs careful waiting again; today she clearly states she is ready to start HD in hospital  02/23 0701 - 02/24 0700 In: 702 [P.O.:702] Out: -   Filed Weights   07/01/17 0500 07/02/17 0554 07/03/17 0555  Weight: 53.7 kg (118 lb 6.2 oz) 53.7 kg (118 lb 6.2 oz) 54.5 kg (120 lb 2.4 oz)    Scheduled Meds: . calcitRIOL  0.25 mcg Oral Daily  . darbepoetin (ARANESP) injection - NON-DIALYSIS  60 mcg Subcutaneous Q Fri-1800  . famotidine  20 mg Oral Daily  . felodipine  10 mg Oral Daily  . furosemide  120 mg Oral BID  . gabapentin  300 mg Oral Daily  . heparin  5,000 Units Subcutaneous Q8H  . hydrALAZINE  50 mg Oral Q8H  . hydroxychloroquine  200 mg Oral Daily  . ipratropium-albuterol  3 mL Nebulization TID  . labetalol  200 mg Oral BID  . mouth rinse  15 mL Mouth Rinse BID  . mycophenolate  1,000 mg Oral BID  . ondansetron (ZOFRAN) IV  4 mg Intravenous Once   Continuous Infusions: . sodium chloride    . [START ON 07/04/2017]  ceFAZolin (ANCEF) IV    . ferric gluconate (FERRLECIT/NULECIT) IV Stopped (07/02/17 1252)   PRN Meds:.sodium chloride, acetaminophen, hydrALAZINE, labetalol  Current Labs: reviewed  Results for Kathleen [name], Kathleen [name] (MRN [id number]) as of 06/30/2017 08:33  Ref. Range 06/29/2017 23:20  Saturation Ratios Latest Ref Range: 10.4 - 31.8 % 13  Ferritin Latest Ref Range: 11 - 307 ng/mL 290    Physical Exam:  Blood pressure (!) 125/91, pulse 88, temperature 98.3 F (36.8 C), temperature source Oral, resp. rate 20, height 5\' 4"  (1.626 m), weight 54.5 kg (120 lb 2.4 oz), SpO2 97 %. GEN: On [place], breathing comfortably, awake, alert groggy in NAD ENT: NCAT EYES: EOMI CV: Regular rhythm, normal rate, no rub PULM: Bibasilar crackles, no wheezing ABD: Soft, nontender SKIN: No rashes or lesions EXT: Trace peripheral  edema   A 1. Progressive CKD5 now near ESRD 2/2 SLE nephritis, 07/2016 Bx w/o severe scarring and no activity; not very uremic; volume control primary issue 2. HTN Emergency with Pulmonary Edema on BiPAP; resolved with diuresis and BP control 3. SLE, quiescent it appears on MMF/Pred/Hydroxychloroquine  4. ANemia, chornic likely related to CKD and chronic disesae; features of IDA rec IVFe s/p aranesp 2/22 5. Ongoing tobacco user 6. BMD, 2HPTH; P ok   P 1. Pt clearly wishes to start HD in hospital; which is reasonable and perhaps safer for her 2. Will let VVS know that iwll need TDC and AVF 3. Cont PO lasix 4. Cont other BP meds 5. Cont Fe load 6. Will need to CLIP pt tomorrow 7. Daily weights, Daily Renal Panel, Strict I/Os, Avoid nephrotoxins (NSAIDs, judicious IV Contrast) 8. HD tomorrow if access obtained   Pearson Grippe MD 07/03/2017, 9:59 AM  Recent Labs  Lab 06/29/17 1057 06/29/17 1455 06/30/17 0257 07/02/17 0245  NA 140  --  138 134*  K 5.3*  --  5.1 5.1  CL 110  --  105 98*  CO2 18*  --  17* 17*  GLUCOSE 102*  --  112* 87  BUN 61*  --  65* 76*  CREATININE 7.21* 7.14* 7.24* 7.80*  CALCIUM 8.7*  --  8.9 8.6*  PHOS  --   --  5.0* 5.9*   Recent Labs  Lab 06/29/17 1057 06/30/17 0257  WBC 6.8 5.7  NEUTROABS 5.1  --   HGB 8.6* 7.6*  HCT 28.5* 24.7*  MCV 94.4 92.5  PLT 248 261

## 2017-07-03 NOTE — Progress Notes (Signed)
PROGRESS NOTE  Kathleen [name] WYO:[id number] DOB: 1960-08-23 DOA: 06/29/2017 PCP: System, Pcp Not In  Brief Narrative: 35yow PMH lupus, CKD stage V presented with progressive SOB. Admitted by PCCM for hypertensive crisis, acute resp failure on BIPAP. Treated with BiPAP, NTG infusion. Seen by nephrology, dx with new ESRD with recommendation to initiate HD.  Assessment/Plan ESRD, with metabolic acidosis, previously CKD 5 with history of lupus nephritis, with metabolic acidosis on admission -Patient now wants to start dialysis.  Plan for tunnel catheter and fistula placement 2/25.  Hypertensive emergency  -Blood pressure remains stable.  Resolved with diuresis and antihypertensives  Essential HTN -Remains in stable, continue labetalol, hydralazine, felodipine today  Acute resp failure secondary to above, acute diastolic CHF, treated with BiPAP and diuresis -Resolved.  Anemia of CKD, chronic disease -Stable.  Continue management per nephrology   DVT prophylaxis: heparin Code Status: full Family Communication: none Disposition Plan: home    [name], MD  [facility] Direct contact: [phone] --Via [place]  --[email or web address]; password [email or web address]  7PM-7AM contact night coverage as above 07/03/2017, 12:48 PM  LOS: 4 days   Consultants:  PCCM  Nephrology  VVS  Procedures:  Echo Study Conclusions  - Left ventricle: The cavity size was normal. Wall thickness was   increased in a pattern of mild LVH. Systolic function was mildly   reduced. The estimated ejection fraction was in the range of 45%   to 50%. Diffuse hypokinesis. Features are consistent with a   pseudonormal left ventricular filling pattern, with concomitant   abnormal relaxation and increased filling pressure (grade 2   diastolic dysfunction). Doppler parameters are consistent with   high ventricular filling pressure. - Aortic valve: There was moderate regurgitation. - Mitral valve: There was  mild regurgitation. - Left atrium: The atrium was severely dilated. - Right atrium: The atrium was mildly dilated.  Impressions:  - Low normal to mildly reduced LV systolic function; mild LVH;   moderate diastolic dysfunction; moderate AI; mildly dilated   aortic root (44 mm); suggest CTA or MRA to further assess; mild   MR; biatrial enlargement.  Antimicrobials:  Azithromycin 2/20 >> 2/24  Interval history/Subjective: Feels okay today.  She is ready to begin dialysis.  Objective: Vitals:  Vitals:   07/03/17 0900 07/03/17 1214  BP:  108/82  Pulse:  79  Resp:  20  Temp:  98.3 F (36.8 C)  SpO2: 97% 100%    Exam:  Constitutional:   . Appears calm and comfortable Respiratory:  . CTA bilaterally, no w/r/r.  . Respiratory effort normal Cardiovascular:  . RRR, no m/r/g . No LE extremity edema   Psychiatric:  . Mental status o Mood, affect appropriate  I have personally reviewed the following:   Labs:  Marland Kitchen  Potassium 5.1, BUN up to 86, creatinine trending up, 8.71.  Anion gap 18.  Hemoglobin improved, 9.1.  Scheduled Meds: . calcitRIOL  0.25 mcg Oral Daily  . darbepoetin (ARANESP) injection - NON-DIALYSIS  60 mcg Subcutaneous Q Fri-1800  . famotidine  20 mg Oral Daily  . felodipine  10 mg Oral Daily  . furosemide  120 mg Oral BID  . gabapentin  300 mg Oral Daily  . heparin  5,000 Units Subcutaneous Q8H  . hydrALAZINE  50 mg Oral Q8H  . hydroxychloroquine  200 mg Oral Daily  . ipratropium-albuterol  3 mL Nebulization TID  . labetalol  200 mg Oral BID  . mouth rinse  15 mL Mouth Rinse BID  .  mycophenolate  1,000 mg Oral BID  . ondansetron (ZOFRAN) IV  4 mg Intravenous Once   Continuous Infusions: . sodium chloride    . [START ON 07/04/2017]  ceFAZolin (ANCEF) IV    . ferric gluconate (FERRLECIT/NULECIT) IV 250 mg (07/03/17 0959)    Principal Problem:   Stage 5 chronic kidney disease not on chronic dialysis ([place]) Active Problems:   Hypertension    Hypertensive crisis   Benign essential HTN   LOS: 4 days

## 2017-07-03 NOTE — Progress Notes (Signed)
  Progress Note    07/03/2017 10:09 AM * No surgery date entered *  Subjective:  No overnight issues  Vitals:   07/03/17 0755 07/03/17 0900  BP: (!) 125/91   Pulse: 88   Resp: 20   Temp: 98.3 F (36.8 C)   SpO2: 100% 97%    Physical Exam: aaox3 Non labored respirations Left arm has an iv below the antecubitum Palpable left radial pulse  CBC    Component Value Date/Time   WBC 5.7 06/30/2017 0257   RBC 2.67 (L) 06/30/2017 0257   HGB 7.6 (L) 06/30/2017 0257   HCT 24.7 (L) 06/30/2017 0257   PLT 261 06/30/2017 0257   MCV 92.5 06/30/2017 0257   MCH 28.5 06/30/2017 0257   MCHC 30.8 06/30/2017 0257   RDW 14.5 06/30/2017 0257   LYMPHSABS 1.4 06/29/2017 1057   MONOABS 0.2 06/29/2017 1057   EOSABS 0.1 06/29/2017 1057   BASOSABS 0.0 06/29/2017 1057    BMET    Component Value Date/Time   NA 134 (L) 07/02/2017 0245   K 5.1 07/02/2017 0245   CL 98 (L) 07/02/2017 0245   CO2 17 (L) 07/02/2017 0245   GLUCOSE 87 07/02/2017 0245   BUN 76 (H) 07/02/2017 0245   CREATININE 7.80 (H) 07/02/2017 0245   CALCIUM 8.6 (L) 07/02/2017 0245   GFRNONAA 5 (L) 07/02/2017 0245   GFRAA 6 (L) 07/02/2017 0245    INR    Component Value Date/Time   INR 1.06 07/12/2016 0522     Intake/Output Summary (Last 24 hours) at 07/03/2017 1009 Last data filed at 07/03/2017 0844 Gross per 24 hour  Intake 1062 ml  Output -  Net 1062 ml     Assessment:  57 y.o. female is going to start hd  Plan: NPO past midnight OR tomorrow for tdc and left arm avf Iv in left arm to switch to right, no further sticks in left arm    Lorenzo Arscott C. Donzetta Matters, MD [facility] [place] [facility] Office: [phone] Pager: [phone]  07/03/2017 10:09 AM

## 2017-07-04 ENCOUNTER — Inpatient Hospital Stay (HOSPITAL_COMMUNITY): Payer: Medicare Other

## 2017-07-04 ENCOUNTER — Encounter (HOSPITAL_COMMUNITY)
Admission: EM | Disposition: A | Discharge status: Home / Self Care | Payer: Self-pay | Source: Home / Self Care | Attending: Family Medicine

## 2017-07-04 ENCOUNTER — Encounter (HOSPITAL_COMMUNITY): Payer: Self-pay | Admitting: Surgery

## 2017-07-04 ENCOUNTER — Inpatient Hospital Stay (HOSPITAL_COMMUNITY): Payer: Medicare Other | Admitting: Anesthesiology

## 2017-07-04 DIAGNOSIS — D631 Anemia in chronic kidney disease: Secondary | ICD-10-CM

## 2017-07-04 HISTORY — PX: AV FISTULA PLACEMENT: SHX1204

## 2017-07-04 HISTORY — PX: INSERTION OF DIALYSIS CATHETER: SHX1324

## 2017-07-04 LAB — PROTIME-INR
INR: 1.04
Prothrombin Time: 13.5 seconds (ref 11.4–15.2)

## 2017-07-04 LAB — BASIC METABOLIC PANEL
ANION GAP: 20 — AB (ref 5–15)
BUN: 102 mg/dL — ABNORMAL HIGH (ref 6–20)
CO2: 19 mmol/L — AB (ref 22–32)
Calcium: 9 mg/dL (ref 8.9–10.3)
Chloride: 97 mmol/L — ABNORMAL LOW (ref 101–111)
Creatinine, Ser: 8.87 mg/dL — ABNORMAL HIGH (ref 0.44–1.00)
GFR calc Af Amer: 5 mL/min — ABNORMAL LOW (ref >60)
GFR calc non Af Amer: 4 mL/min — ABNORMAL LOW (ref >60)
GLUCOSE: 95 mg/dL (ref 65–99)
POTASSIUM: 5.6 mmol/L — AB (ref 3.5–5.1)
Sodium: 136 mmol/L (ref 135–145)

## 2017-07-04 LAB — CULTURE, BLOOD (ROUTINE X 2)
CULTURE: NO GROWTH
CULTURE: NO GROWTH
SPECIAL REQUESTS: ADEQUATE
Special Requests: ADEQUATE

## 2017-07-04 LAB — CBC
HEMATOCRIT: 27.9 % — AB (ref 36.0–46.0)
HEMOGLOBIN: 8.6 g/dL — AB (ref 12.0–15.0)
MCH: 28.4 pg (ref 26.0–34.0)
MCHC: 30.8 g/dL (ref 30.0–36.0)
MCV: 92.1 fL (ref 78.0–100.0)
Platelets: 309 10*3/uL (ref 150–400)
RBC: 3.03 MIL/uL — ABNORMAL LOW (ref 3.87–5.11)
RDW: 14.6 % (ref 11.5–15.5)
WBC: 5.6 10*3/uL (ref 4.0–10.5)

## 2017-07-04 LAB — ALT: ALT: 17 U/L (ref 14–54)

## 2017-07-04 SURGERY — INSERTION OF DIALYSIS CATHETER
Anesthesia: Monitor Anesthesia Care | Site: Neck | Laterality: Right

## 2017-07-04 MED ORDER — PROPOFOL 10 MG/ML IV BOLUS
INTRAVENOUS | Status: DC | PRN
  Administered 2017-07-04: 20 mg via INTRAVENOUS

## 2017-07-04 MED ORDER — PROPOFOL 500 MG/50ML IV EMUL
INTRAVENOUS | Status: AC
  Filled 2017-07-04: qty 150

## 2017-07-04 MED ORDER — SUCCINYLCHOLINE CHLORIDE 200 MG/10ML IV SOSY
PREFILLED_SYRINGE | INTRAVENOUS | Status: AC
  Filled 2017-07-04: qty 10

## 2017-07-04 MED ORDER — CALCITRIOL 0.5 MCG PO CAPS: 0.5000 ug | ORAL_CAPSULE | ORAL | Status: DC

## 2017-07-04 MED ORDER — PROPOFOL 500 MG/50ML IV EMUL
INTRAVENOUS | Status: DC | PRN
  Administered 2017-07-04: 75 ug/kg/min via INTRAVENOUS

## 2017-07-04 MED ORDER — FENTANYL CITRATE (PF) 100 MCG/2ML IJ SOLN
INTRAMUSCULAR | Status: DC | PRN
  Administered 2017-07-04: 50 ug via INTRAVENOUS

## 2017-07-04 MED ORDER — LIDOCAINE 2% (20 MG/ML) 5 ML SYRINGE
INTRAMUSCULAR | Status: AC
  Filled 2017-07-04: qty 5

## 2017-07-04 MED ORDER — SODIUM CHLORIDE 0.9 % IV SOLN: 100.0000 mL | INTRAVENOUS | Status: DC | PRN

## 2017-07-04 MED ORDER — LIDOCAINE-PRILOCAINE 2.5-2.5 % EX CREA
1.0000 "application " | TOPICAL_CREAM | CUTANEOUS | Status: DC | PRN
  Filled 2017-07-04: qty 5

## 2017-07-04 MED ORDER — SODIUM CHLORIDE 0.9 % IV SOLN
125.0000 mg | INTRAVENOUS | Status: DC
  Filled 2017-07-04 (×3): qty 10

## 2017-07-04 MED ORDER — CINACALCET HCL 30 MG PO TABS
60.0000 mg | ORAL_TABLET | Freq: Every day | ORAL | Status: DC
  Administered 2017-07-05 – 2017-07-06 (×2): 60 mg via ORAL
  Filled 2017-07-04 (×2): qty 2

## 2017-07-04 MED ORDER — PENTAFLUOROPROP-TETRAFLUOROETH EX AERO: 1.0000 "application " | INHALATION_SPRAY | CUTANEOUS | Status: DC | PRN

## 2017-07-04 MED ORDER — HEPARIN SODIUM (PORCINE) 1000 UNIT/ML IJ SOLN
INTRAMUSCULAR | Status: AC
  Filled 2017-07-04: qty 1

## 2017-07-04 MED ORDER — CALCITRIOL 0.5 MCG PO CAPS
2.0000 ug | ORAL_CAPSULE | ORAL | Status: DC
  Administered 2017-07-06: 2 ug via ORAL
  Filled 2017-07-04 (×2): qty 4

## 2017-07-04 MED ORDER — PHENYLEPHRINE HCL 10 MG/ML IJ SOLN
INTRAMUSCULAR | Status: DC | PRN
  Administered 2017-07-04: 80 ug via INTRAVENOUS
  Administered 2017-07-04: 120 ug via INTRAVENOUS

## 2017-07-04 MED ORDER — 0.9 % SODIUM CHLORIDE (POUR BTL) OPTIME
TOPICAL | Status: DC | PRN
  Administered 2017-07-04: 1000 mL

## 2017-07-04 MED ORDER — DARBEPOETIN ALFA 200 MCG/0.4ML IJ SOSY: 200.0000 ug | PREFILLED_SYRINGE | INTRAMUSCULAR | Status: DC

## 2017-07-04 MED ORDER — HEPARIN SODIUM (PORCINE) 1000 UNIT/ML DIALYSIS
1000.0000 [IU] | INTRAMUSCULAR | Status: DC | PRN
  Filled 2017-07-04: qty 1

## 2017-07-04 MED ORDER — RENA-VITE PO TABS
1.0000 | ORAL_TABLET | Freq: Every day | ORAL | Status: DC
  Administered 2017-07-04 – 2017-07-06 (×3): 1 via ORAL
  Filled 2017-07-04 (×3): qty 1

## 2017-07-04 MED ORDER — FELODIPINE ER 10 MG PO TB24
10.0000 mg | ORAL_TABLET | Freq: Every day | ORAL | Status: DC
  Administered 2017-07-05 – 2017-07-06 (×2): 10 mg via ORAL
  Filled 2017-07-04 (×2): qty 1

## 2017-07-04 MED ORDER — ONDANSETRON HCL 4 MG/2ML IJ SOLN
INTRAMUSCULAR | Status: DC | PRN
  Administered 2017-07-04: 4 mg via INTRAVENOUS

## 2017-07-04 MED ORDER — HEPARIN SODIUM (PORCINE) 1000 UNIT/ML IJ SOLN
INTRAMUSCULAR | Status: DC | PRN
  Administered 2017-07-04: 3.4 [IU]

## 2017-07-04 MED ORDER — MIDAZOLAM HCL 2 MG/2ML IJ SOLN
INTRAMUSCULAR | Status: AC
  Filled 2017-07-04: qty 2

## 2017-07-04 MED ORDER — LIDOCAINE-EPINEPHRINE (PF) 1 %-1:200000 IJ SOLN
INTRAMUSCULAR | Status: AC
  Filled 2017-07-04: qty 30

## 2017-07-04 MED ORDER — ALTEPLASE 2 MG IJ SOLR: 2.0000 mg | Freq: Once | INTRAMUSCULAR | Status: DC | PRN

## 2017-07-04 MED ORDER — SODIUM CHLORIDE 0.9 % IV SOLN
INTRAVENOUS | Status: DC
  Administered 2017-07-04 (×2): via INTRAVENOUS

## 2017-07-04 MED ORDER — SODIUM CHLORIDE 0.9 % IV SOLN
INTRAVENOUS | Status: DC | PRN
  Administered 2017-07-04: 500 mL

## 2017-07-04 MED ORDER — MYCOPHENOLATE MOFETIL 250 MG PO CAPS
500.0000 mg | ORAL_CAPSULE | Freq: Two times a day (BID) | ORAL | Status: DC
  Administered 2017-07-04 – 2017-07-07 (×6): 500 mg via ORAL
  Filled 2017-07-04 (×6): qty 2

## 2017-07-04 MED ORDER — FENTANYL CITRATE (PF) 250 MCG/5ML IJ SOLN
INTRAMUSCULAR | Status: AC
  Filled 2017-07-04: qty 5

## 2017-07-04 MED ORDER — LIDOCAINE HCL (PF) 1 % IJ SOLN
INTRAMUSCULAR | Status: AC
  Filled 2017-07-04: qty 30

## 2017-07-04 MED ORDER — LIDOCAINE HCL (PF) 1 % IJ SOLN
INTRAMUSCULAR | Status: DC | PRN
  Administered 2017-07-04: 10 mL

## 2017-07-04 MED ORDER — MIDAZOLAM HCL 5 MG/5ML IJ SOLN
INTRAMUSCULAR | Status: DC | PRN
  Administered 2017-07-04: 2 mg via INTRAVENOUS

## 2017-07-04 MED ORDER — LIDOCAINE HCL (PF) 1 % IJ SOLN: 5.0000 mL | INTRAMUSCULAR | Status: DC | PRN

## 2017-07-04 SURGICAL SUPPLY — 53 items
ARMBAND PINK RESTRICT EXTREMIT (MISCELLANEOUS) ×8 IMPLANT
BAG DECANTER FOR FLEXI CONT (MISCELLANEOUS) ×4 IMPLANT
BIOPATCH RED 1 DISK 7.0 (GAUZE/BANDAGES/DRESSINGS) ×3 IMPLANT
BIOPATCH RED 1IN DISK 7.0MM (GAUZE/BANDAGES/DRESSINGS) ×1
CANISTER SUCT 3000ML PPV (MISCELLANEOUS) ×4 IMPLANT
CATH PALINDROME RT-P 15FX23CM (CATHETERS) ×4 IMPLANT
CLIP VESOCCLUDE MED 6/CT (CLIP) ×4 IMPLANT
CLIP VESOCCLUDE SM WIDE 6/CT (CLIP) ×4 IMPLANT
COVER DOME SNAP 22 D (MISCELLANEOUS) ×4 IMPLANT
COVER PROBE W GEL 5X96 (DRAPES) ×4 IMPLANT
COVER SURGICAL LIGHT HANDLE (MISCELLANEOUS) ×4 IMPLANT
DECANTER SPIKE VIAL GLASS SM (MISCELLANEOUS) ×4 IMPLANT
DERMABOND ADVANCED (GAUZE/BANDAGES/DRESSINGS) ×4
DERMABOND ADVANCED .7 DNX12 (GAUZE/BANDAGES/DRESSINGS) ×4 IMPLANT
DRAPE C-ARM 42X72 X-RAY (DRAPES) ×4 IMPLANT
DRAPE CHEST BREAST 15X10 FENES (DRAPES) ×4 IMPLANT
ELECT REM PT RETURN 9FT ADLT (ELECTROSURGICAL) ×4
ELECTRODE REM PT RTRN 9FT ADLT (ELECTROSURGICAL) ×2 IMPLANT
GAUZE SPONGE 4X4 12PLY STRL LF (GAUZE/BANDAGES/DRESSINGS) ×4 IMPLANT
GAUZE SPONGE 4X4 16PLY XRAY LF (GAUZE/BANDAGES/DRESSINGS) ×4 IMPLANT
GLOVE BIO SURGEON STRL SZ7 (GLOVE) ×4 IMPLANT
GLOVE BIOGEL PI IND STRL 7.5 (GLOVE) ×2 IMPLANT
GLOVE BIOGEL PI INDICATOR 7.5 (GLOVE) ×2
GOWN STRL REUS W/ TWL LRG LVL3 (GOWN DISPOSABLE) ×6 IMPLANT
GOWN STRL REUS W/TWL LRG LVL3 (GOWN DISPOSABLE) ×6
HEMOSTAT SPONGE AVITENE ULTRA (HEMOSTASIS) IMPLANT
KIT BASIN OR (CUSTOM PROCEDURE TRAY) ×4 IMPLANT
KIT ROOM TURNOVER OR (KITS) ×4 IMPLANT
NEEDLE 18GX1X1/2 (RX/OR ONLY) (NEEDLE) ×4 IMPLANT
NEEDLE HYPO 25GX1X1/2 BEV (NEEDLE) ×4 IMPLANT
NS IRRIG 1000ML POUR BTL (IV SOLUTION) ×4 IMPLANT
PACK CV ACCESS (CUSTOM PROCEDURE TRAY) ×4 IMPLANT
PACK SURGICAL SETUP 50X90 (CUSTOM PROCEDURE TRAY) ×4 IMPLANT
PAD ARMBOARD 7.5X6 YLW CONV (MISCELLANEOUS) ×8 IMPLANT
SET MICROPUNCTURE 5F STIFF (MISCELLANEOUS) IMPLANT
SOAP 2 % CHG 4 OZ (WOUND CARE) ×4 IMPLANT
SUT ETHILON 3 0 PS 1 (SUTURE) ×4 IMPLANT
SUT MNCRL AB 4-0 PS2 18 (SUTURE) ×8 IMPLANT
SUT PROLENE 6 0 BV (SUTURE) IMPLANT
SUT PROLENE 7 0 BV 1 (SUTURE) ×4 IMPLANT
SUT VIC AB 3-0 SH 27 (SUTURE) ×2
SUT VIC AB 3-0 SH 27X BRD (SUTURE) ×2 IMPLANT
SYR 10ML LL (SYRINGE) ×4 IMPLANT
SYR 20CC LL (SYRINGE) ×8 IMPLANT
SYR 3ML LL SCALE MARK (SYRINGE) ×4 IMPLANT
SYR 5ML LL (SYRINGE) ×4 IMPLANT
SYR CONTROL 10ML LL (SYRINGE) ×4 IMPLANT
TAPE CLOTH SURG 4X10 WHT LF (GAUZE/BANDAGES/DRESSINGS) ×4 IMPLANT
TOWEL GREEN STERILE (TOWEL DISPOSABLE) ×4 IMPLANT
TOWEL GREEN STERILE FF (TOWEL DISPOSABLE) ×4 IMPLANT
UNDERPAD 30X30 (UNDERPADS AND DIAPERS) ×4 IMPLANT
WATER STERILE IRR 1000ML POUR (IV SOLUTION) ×4 IMPLANT
WIRE AMPLATZ SS-J .035X180CM (WIRE) IMPLANT

## 2017-07-04 NOTE — Anesthesia Procedure Notes (Signed)
Procedure Name: MAC Date/Time: 07/04/2017 12:06 PM Performed by: Leonor Liv, CRNA Pre-anesthesia Checklist: Patient identified, Emergency Drugs available, Suction available, Patient being monitored and Timeout performed Patient Re-evaluated:Patient Re-evaluated prior to induction Oxygen Delivery Method: Simple face mask Placement Confirmation: positive ETCO2

## 2017-07-04 NOTE — Progress Notes (Signed)
Subjective: Interval History: has complaints want less meds.  Objective: Vital signs in last 24 hours: Temp:  [97.4 F (36.3 C)-98.6 F (37 C)] 98.2 F (36.8 C) (02/25 0841) Pulse Rate:  [79-88] 88 (02/25 0841) Resp:  [17-20] 18 (02/25 0841) BP: (108-134)/(69-89) 134/89 (02/25 0841) SpO2:  [96 %-100 %] 96 % (02/25 0850) Weight:  [55.3 kg (121 lb 14.6 oz)] 55.3 kg (121 lb 14.6 oz) (02/25 0508) Weight change: 0.8 kg (1 lb 12.2 oz)  Intake/Output from previous day: 02/24 0701 - 02/25 0700 In: 1200 [P.O.:1080; IV Piggyback:120] Out: 0  Intake/Output this shift: No intake/output data recorded.  General appearance: alert, cooperative, cachectic and no distress Resp: clear to auscultation bilaterally Cardio: S1, S2 normal and systolic murmur: systolic ejection 2/6, decrescendo at 2nd left intercostal space GI: soft, non-tender; bowel sounds normal; no masses,  no organomegaly Extremities: extremities normal, atraumatic, no cyanosis or edema  Lab Results: Recent Labs    07/03/17 1132 07/04/17 0216  WBC 4.6 5.6  HGB 9.1* 8.6*  HCT 28.8* 27.9*  PLT 275 309   BMET:  Recent Labs    07/03/17 1132 07/04/17 0216  NA 133* 136  K 5.1 5.6*  CL 97* 97*  CO2 18* 19*  GLUCOSE 97 95  BUN 86* 102*  CREATININE 8.71* 8.87*  CALCIUM 8.9 9.0   Recent Labs    07/02/17 0245  PTH 765*   Iron Studies: No results for input(s): IRON, TIBC, TRANSFERRIN, FERRITIN in the last 72 hours.  Studies/Results: No results found.  I have reviewed the patient's current medications.  Assessment/Plan: 1 New ESRD to get Access, PC and perm 2 HTN lower vol and meds 3 Anemia esa/FE 4 HPTH very high vit D and Cinn 5 SLE will lower meds P HD, esa, Fe, lower meds    LOS: 5 days   Kathleen [name] 07/04/2017,10:11 AM

## 2017-07-04 NOTE — Progress Notes (Signed)
Assessed left AVF site, found to be -[place]  And - Bruit, assessed with doppler,  Still negative, MD aware

## 2017-07-04 NOTE — Op Note (Addendum)
OPERATIVE NOTE  PROCEDURE: 1.  Right internal jugular vein tunneled dialysis catheter placement 2.  Right internal jugular vein cannulation under ultrasound guidance 3.  Left brachiocephalic arteriovenous fistula placement  PRE-OPERATIVE DIAGNOSIS: end-stage renal failure  POST-OPERATIVE DIAGNOSIS: same as above  SURGEON: Adele Barthel, MD  ASSIST: Gerri Lins, PAC   ANESTHESIA: local and MAC  ESTIMATED BLOOD LOSS: 30 cc  FINDING(S): 1.  Tips of the catheter in the right atrium on fluoroscopy 2.  No obvious pneumothorax on fluoroscopy 3.  Cephalic vein: 3-4 mm, acceptable 4.  Brachial artery: 3 mm, somewhat thickened wall 5  Venous outflow: faintly palpable thrill, dopplerable pulsatile character 6.  Radial flow: palpable radial pulse   SPECIMEN(S):  none  INDICATIONS:   Kathleen [name] is a 57 y.o. female who presents with hemodialysis dependence.  The patient presents for tunneled dialysis catheter placement, left arteriovenous fistula vs arteriovenous graft placement.  The patient is aware the risks of tunneled dialysis catheter placement include but are not limited to: bleeding, infection, central venous injury, pneumothorax, possible venous stenosis, possible malpositioning in the venous system, and possible infections related to long-term catheter presence.  The patient was aware of these risks and agreed to proceed.   DESCRIPTION: After written full informed consent was obtained from the patient, the patient was taken back to the operating room.  Prior to induction, the patient was given IV antibiotics.  After obtaining adequate sedation, the patient was prepped and draped in the standard fashion for a chest or neck tunneled dialysis catheter placement.     The cannulation site, the catheter exit site, and tract for the subcutaneous tunnel were then anesthestized with a total of 15cc of 1% lidocaine without epinephrine.  Under ultrasound guidance, the right internal  jugular vein was cannulated with the 18 gauge needle.  A J-wire was then placed down into the inferior vena cava under fluoroscopic guidance.  The wire was then secured in place with a clamp to the drapes.  The tapered wire guide was left occluding the lumen of the needle to avoid air embolism.    I then made stab incisions at the neck and exit sites.   I dissected from the exit site to the cannulation site with a tunneler.   The wire was then unclamped and I removed the needle.  The skin tract and venotomy was dilated serially with graduated dilators, passed under fluoroscopic guidance.   Finally, the dilator-sheath was placed under fluoroscopic guidance into the superior vena cava.  The central dilator and wire were removed.  A 23 cm Palindrome catheter was placed under fluoroscopic guidance down into the right atrium.    The sheath was broken and peeled away while holding the catheter cuff at the level of the skin.  The catheter was clamped with the plastic clamp and the back end of this catheter was transected.  One lumen was docked onto the tunneler.  The catheter was delivered through the subcutaneous tunnel by pulling the tunneler out the exit site.  The catheter was reclamped and was transected a second time, revealing the two lumens of this catheter.  The catheter collar was loaded over the back end of the catheter.  The two  ports were docked onto these two lumens.  The catheter collar was then snapped into place, securing the two ports.  Each port was tested by aspirating and flushing.  No resistance was noted.  Each port was then thoroughly flushed with heparinized saline.  The catheter was secured in placed with two interrupted stitches of 3-0 Nylon tied to the catheter.  The neck incision was closed with a U-stitch of 4-0 Monocryl.  The neck and chest incision were cleaned.  The closed neck incision was reinforced with Dermabond and sterile bandages applied to the catheter exit site.  Each port was  then loaded with concentrated heparin (1000 Units/mL) at the manufacturer recommended volumes to each port.  Sterile caps were applied to each port.    On completion fluoroscopy, the tips of the catheter were in the right atrium, and there was no evidence of pneumothorax.  At this point, the drapes were taken down.  The patient was repositioned for a left arm access procedure.  The patient was then reprepped and redraped in the standard fashion for a left arm access procedure.  I turned my attention first to identifying the patient's cephalic vein and brachial artery.  Using SonoSite guidance, the location of these vessels were marked out on the skin.     At this point, I injected local anesthetic to obtain a field block of the antecubitum.  In total, I injected about 5 mL of 1% lidocaine without epinephrine.  I made a transverse incision at the level of the antecubitum and dissected through the subcutaneous tissue and fascia to gain exposure of the brachial artery.  This was noted to be 3 mm in diameter externally.  This was dissected out proximally and distally and controlled with vessel loops .  I then dissected out the cephalic vein.  This was noted to be 3-4 mm in diameter externally.  The distal segment of the vein was ligated with a  2-0 silk, and the vein was transected.  The proximal segment was interrogated with serial dilators.  The vein accepted up to a 4 mm dilator without any difficulty.  I then instilled the heparinized saline into the vein and clamped it.  At this point, I reset my exposure of the brachial artery and placed the artery under tension proximally and distally.  I made an arteriotomy with a #11 blade, and then I extended the arteriotomy with a Potts scissor.  I injected heparinized saline proximal and distal to this arteriotomy.  The vein was then sewn to the artery in an end-to-side configuration with a running stitch of 7-0 Prolene.  Prior to completing this anastomosis, I allowed  the vein and artery to backbleed.  There was no evidence of clot from any vessels.  I completed the anastomosis in the usual fashion and then released all vessel loops and clamps.    There was a faintly palpable thrill in the venous outflow.  On Sonosite, there was pulsatile character to the fistula.  At the wrist, there was a palpable radial pulse.  At this point, I irrigated out the surgical wound.  There was no further active bleeding.  The subcutaneous tissue was reapproximated with a running stitch of 3-0 Vicryl.  The skin was then reapproximated with a running subcuticular stitch of 4-0 Vicryl.  The skin was then cleaned, dried, and reinforced with Dermabond.  The patient tolerated this procedure well.    COMPLICATIONS: none  CONDITION: stable   Adele Barthel, MD, [facility] [place] Office: [phone] Pager: [phone]  07/04/2017, 12:46 PM

## 2017-07-04 NOTE — Transfer of Care (Addendum)
Immediate Anesthesia Transfer of Care Note  Patient: Kathleen [name]  Procedure(s) Performed: INSERTION OF TUNNELED  DIALYSIS CATHETER - RIGHT INTERNAL JUGULAR PLACEMENT (Right Neck) ARTERIOVENOUS (AV) FISTULA CREATION (Left Arm Lower)  Patient Location: PACU  Anesthesia Type:MAC  Level of Consciousness: awake, alert  and oriented  Airway & Oxygen Therapy: Patient Spontanous Breathing  Post-op Assessment: Report given to RN and Post -op Vital signs reviewed and stable  Post vital signs: Reviewed and stable  Last Vitals:  Vitals:   07/04/17 0841 07/04/17 0850  BP: 134/89   Pulse: 88   Resp: 18   Temp: 36.8 C   SpO2: 100% 96%    Last Pain:  Vitals:   07/04/17 0841  TempSrc: Oral  PainSc: 0-No pain         Complications: No apparent anesthesia complications

## 2017-07-04 NOTE — Anesthesia Postprocedure Evaluation (Signed)
Anesthesia Post Note  Patient: Kathleen [name]  Procedure(s) Performed: INSERTION OF TUNNELED  DIALYSIS CATHETER - RIGHT INTERNAL JUGULAR PLACEMENT (Right Neck) ARTERIOVENOUS (AV) FISTULA CREATION (Left Arm Lower)     Patient location during evaluation: PACU Anesthesia Type: MAC Level of consciousness: awake and alert Pain management: pain level controlled Vital Signs Assessment: post-procedure vital signs reviewed and stable Respiratory status: spontaneous breathing, nonlabored ventilation and respiratory function stable Cardiovascular status: stable and blood pressure returned to baseline Postop Assessment: no apparent nausea or vomiting Anesthetic complications: no    Last Vitals:  Vitals:   07/04/17 1423 07/04/17 1425  BP: 119/62   Pulse: 68   Resp: 13   Temp:  36.7 C  SpO2: 100%     Last Pain:  Vitals:   07/04/17 1425  TempSrc:   PainSc: 0-No pain                 Virgal Warmuth,W. EDMOND

## 2017-07-04 NOTE — Procedures (Signed)
I was present at this session.  I have reviewed the session itself and made appropriate changes.  HDD via PC , bp Edythe Clarity start. Low flows, 1st tx  [place] Area 2/25/20194:30 PM

## 2017-07-04 NOTE — Progress Notes (Signed)
PROGRESS NOTE  Kathleen [name] JKD:[id number] DOB: 05/16/1960 DOA: 06/29/2017 PCP: System, Pcp Not In  Brief Narrative: 63yow PMH lupus, CKD stage V presented with progressive SOB. Admitted by PCCM for hypertensive crisis, acute resp failure on BIPAP. Treated with BiPAP, NTG infusion. Seen by nephrology, dx with new ESRD with recommendation to initiate HD.  Assessment/Plan ESRD, with metabolic acidosis, previously CKD 5 with history of lupus nephritis, with metabolic acidosis on admission - s/p HD catheter and AVF placement 2/25 - first HD treatment today  Hypertensive emergency  - BP stable s/p diuresis  Essential HTN -remains stable, continue labetalol, hydralazine, felodipine   Acute resp failure secondary to above, acute diastolic CHF, treated with BiPAP and diuresis -Resolved.  Anemia of CKD, chronic disease - Hgb stable.   DVT prophylaxis: heparin Code Status: full Family Communication: none Disposition Plan: home    [name], MD  [facility] Direct contact: [phone] --[place] [place]  --[email or web address]; password [email or web address]  7PM-7AM contact night coverage as above 07/04/2017, 5:27 PM  LOS: 5 days   Consultants:  PCCM  Nephrology  VVS  Procedures:  Echo Study Conclusions  - Left ventricle: The cavity size was normal. Wall thickness was   increased in a pattern of mild LVH. Systolic function was mildly   reduced. The estimated ejection fraction was in the range of 45%   to 50%. Diffuse hypokinesis. Features are consistent with a   pseudonormal left ventricular filling pattern, with concomitant   abnormal relaxation and increased filling pressure (grade 2   diastolic dysfunction). Doppler parameters are consistent with   high ventricular filling pressure. - Aortic valve: There was moderate regurgitation. - Mitral valve: There was mild regurgitation. - Left atrium: The atrium was severely dilated. - Right atrium: The atrium was mildly  dilated.  Impressions:  - Low normal to mildly reduced LV systolic function; mild LVH;   moderate diastolic dysfunction; moderate AI; mildly dilated   aortic root (44 mm); suggest CTA or MRA to further assess; mild   MR; biatrial enlargement.  2/25 per VVS 1.  Right internal jugular vein tunneled dialysis catheter placement 2.  Right internal jugular vein cannulation under ultrasound guidance 3.  Left brachiocephalic arteriovenous fistula placement  Antimicrobials:  Azithromycin 2/20 >> 2/24  Interval history/Subjective: Has pain in left arm.  Objective: Vitals:  Vitals:   07/04/17 1530 07/04/17 1600  BP: (!) 153/76 (!) 144/65  Pulse: 73 74  Resp: 17 18  Temp:    SpO2: 100% 100%    Exam:  Constitutional:   . Appears calm and comfortable Respiratory:  . CTA bilaterally, no w/r/r.  . Respiratory effort normal Cardiovascular:  . RRR, no m/r/g . No LE extremity edema   Psychiatric:  . Mental status o Mood, affect appropriate  I have personally reviewed the following:   Labs:  .potassium 5.6, BN up to 102, creatinine up to 8.87  Hgb stable 9.1  Scheduled Meds: . [MAR Hold] calcitRIOL  2 mcg Oral QODAY  . [MAR Hold] cinacalcet  60 mg Oral Q supper  . [MAR Hold] darbepoetin (ARANESP) injection - NON-DIALYSIS  200 mcg Subcutaneous Q Fri-1800  . [MAR Hold] famotidine  20 mg Oral Daily  . [MAR Hold] felodipine  10 mg Oral QHS  . [MAR Hold] gabapentin  300 mg Oral Daily  . [MAR Hold] hydroxychloroquine  200 mg Oral Daily  . [MAR Hold] ipratropium-albuterol  3 mL Nebulization TID  . [MAR Hold] labetalol  200 mg Oral BID  . [  MAR Hold] mouth rinse  15 mL Mouth Rinse BID  . [MAR Hold] multivitamin  1 tablet Oral QHS  . [MAR Hold] mycophenolate  500 mg Oral BID  . [MAR Hold] ondansetron (ZOFRAN) IV  4 mg Intravenous Once   Continuous Infusions: . [MAR Hold] sodium chloride    . sodium chloride    . sodium chloride    . sodium chloride 10 mL/hr at 07/04/17  1027  . [MAR Hold] ferric gluconate (FERRLECIT/NULECIT) IV    . ferric gluconate (FERRLECIT/NULECIT) IV Stopped (07/03/17 1059)    Principal Problem:   Stage 5 chronic kidney disease not on chronic dialysis ([place]) Active Problems:   Hypertension   Hypertensive crisis   Benign essential HTN   LOS: 5 days

## 2017-07-04 NOTE — Progress Notes (Addendum)
Patient returned from HD, AOx4.no c/o pain/distress, lungs CTA. Bed Alarm on, bed in lowest position, callbell and phone within reach. Patient is placing order for dinner at this time.

## 2017-07-04 NOTE — Interval H&P Note (Signed)
History and Physical Interval Note:  07/04/2017 7:29 AM  Kathleen [name]  has presented today for surgery, with the diagnosis of END STAGE RENAL DISEASE FOR HEMODIALYSIS ACCESS  The various methods of treatment have been discussed with the patient and family. After consideration of risks, benefits and other options for treatment, the patient has consented to  Procedure(s): INSERTION OF TUNNELED  DIALYSIS CATHETER (N/A) ARTERIOVENOUS (AV) FISTULA CREATION VERSUS GRAFT LEFT ARM (Left) as a surgical intervention .  The patient's history has been reviewed, patient examined, no change in status, stable for surgery.  I have reviewed the patient's chart and labs.  Questions were answered to the patient's satisfaction.     Adele Barthel

## 2017-07-05 ENCOUNTER — Telehealth: Payer: Self-pay | Admitting: Vascular Surgery

## 2017-07-05 ENCOUNTER — Encounter (HOSPITAL_COMMUNITY): Payer: Self-pay | Admitting: Vascular Surgery

## 2017-07-05 LAB — HEPATITIS B SURFACE ANTIBODY,QUALITATIVE: Hep B S Ab: NONREACTIVE

## 2017-07-05 LAB — HEPATITIS B SURFACE ANTIGEN: Hepatitis B Surface Ag: NEGATIVE

## 2017-07-05 LAB — HEPATITIS B CORE ANTIBODY, TOTAL: HEP B C TOTAL AB: NEGATIVE

## 2017-07-05 MED ORDER — IPRATROPIUM-ALBUTEROL 0.5-2.5 (3) MG/3ML IN SOLN
3.0000 mL | Freq: Two times a day (BID) | RESPIRATORY_TRACT | Status: DC
  Administered 2017-07-06 – 2017-07-07 (×3): 3 mL via RESPIRATORY_TRACT
  Filled 2017-07-05 (×4): qty 3

## 2017-07-05 NOTE — Discharge Instructions (Signed)
° °  [facility]  Discharge Instructions  AV Fistula or Graft Surgery for Dialysis Access  Please refer to the following instructions for your post-procedure care. Your surgeon or physician assistant will discuss any changes with you.  Activity  You may drive the day following your surgery, if you are comfortable and no longer taking prescription pain medication. Resume full activity as the soreness in your incision resolves.  Bathing/Showering  You may shower after you go home. Keep your incision dry for 48 hours. Do not soak in a bathtub, hot tub, or swim until the incision heals completely. You may not shower if you have a hemodialysis catheter.  Incision Care  Clean your incision with mild soap and water after 48 hours. Pat the area dry with a clean towel. You do not need a bandage unless otherwise instructed. Do not apply any ointments or creams to your incision. You may have skin glue on your incision. Do not peel it off. It will come off on its own in about one week. Your arm may swell a bit after surgery. To reduce swelling use pillows to elevate your arm so it is above your heart. Your doctor will tell you if you need to lightly wrap your arm with an ACE bandage.  Diet  Resume your normal diet. There are not special food restrictions following this procedure. In order to heal from your surgery, it is CRITICAL to get adequate nutrition. Your body requires vitamins, minerals, and protein. Vegetables are the best source of vitamins and minerals. Vegetables also provide the perfect balance of protein. Processed food has little nutritional value, so try to avoid this.  Medications  Resume taking all of your medications. If your incision is causing pain, you may take over-the counter pain relievers such as acetaminophen (Tylenol). If you were prescribed a stronger pain medication, please be aware these medications can cause nausea and constipation. Prevent  nausea by taking the medication with a snack or meal. Avoid constipation by drinking plenty of fluids and eating foods with high amount of fiber, such as fruits, vegetables, and grains.  Do not take Tylenol if you are taking prescription pain medications.  Follow up Your surgeon may want to see you in the office following your access surgery. If so, this will be arranged at the time of your surgery.  Please call us immediately for any of the following conditions:  Increased pain, redness, drainage (pus) from your incision site Fever of 101 degrees or higher Severe or worsening pain at your incision site Hand pain or numbness.  Reduce your risk of vascular disease:  Stop smoking. If you would like help, call [facility] at 1-800-QUIT-NOW 5181060132) or [place] at [place] your cholesterol Maintain a desired weight Control your diabetes Keep your blood pressure down  Dialysis  It will take several weeks to several months for your new dialysis access to be ready for use. Your surgeon will determine when it is okay to use it. Your nephrologist will continue to direct your dialysis. You can continue to use your Permcath until your new access is ready for use.   07/05/2017 Kathleen [name] [id number] 08-26-1960  Surgeon(s): Conrad Gratis, MD  Procedure(s): INSERTION OF TUNNELED  DIALYSIS CATHETER - RIGHT INTERNAL JUGULAR PLACEMENT ARTERIOVENOUS (AV) FISTULA CREATION  x Do not stick fistula for 12 weeks    If you have any questions, please call the office at [phone].

## 2017-07-05 NOTE — Progress Notes (Signed)
Subjective: Interval History: has complaints wants to get up and move about.  Objective: Vital signs in last 24 hours: Temp:  [97.7 F (36.5 C)-98.7 F (37.1 C)] 98.4 F (36.9 C) (02/26 0619) Pulse Rate:  [66-92] 89 (02/26 0619) Resp:  [10-18] 18 (02/25 1833) BP: (119-191)/(62-116) 184/116 (02/26 0619) SpO2:  [98 %-100 %] 100 % (02/26 0619) Weight:  [53.3 kg (117 lb 8.1 oz)-54.6 kg (120 lb 5.9 oz)] 53.3 kg (117 lb 8.1 oz) (02/26 0527) Weight change: -0.7 kg (-8.7 oz)  Intake/Output from previous day: 02/25 0701 - 02/26 0700 In: 743.5 [P.O.:480; I.V.:263.5] Out: 0  Intake/Output this shift: No intake/output data recorded.  General appearance: alert, cooperative, cachectic and no distress Resp: diminished breath sounds bilaterally Chest wall: RIJ PC Cardio: S1, S2 normal and systolic murmur: systolic ejection 2/6, crescendo at 2nd left intercostal space GI: soft, non-tender; bowel sounds normal; no masses,  no organomegaly Extremities: AVF LUA no BorT  Lab Results: Recent Labs    07/03/17 1132 07/04/17 0216  WBC 4.6 5.6  HGB 9.1* 8.6*  HCT 28.8* 27.9*  PLT 275 309   BMET:  Recent Labs    07/03/17 1132 07/04/17 0216  NA 133* 136  K 5.1 5.6*  CL 97* 97*  CO2 18* 19*  GLUCOSE 97 95  BUN 86* 102*  CREATININE 8.71* 8.87*  CALCIUM 8.9 9.0   No results for input(s): PTH in the last 72 hours. Iron Studies: No results for input(s): IRON, TIBC, TRANSFERRIN, FERRITIN in the last 72 hours.  Studies/Results: Dg Chest Port 1 View  Result Date: 07/04/2017 CLINICAL DATA:  Dialysis catheter insertion. EXAM: PORTABLE CHEST 1 VIEW COMPARISON:  Chest x-ray dated June 29, 2017. FINDINGS: Interval placement of a right internal jugular tunnel dialysis catheter with the tip at the cavoatrial junction. Stable cardiomegaly. Resolved interstitial edema. No focal consolidation, pleural effusion, or pneumothorax. No acute osseous abnormality. IMPRESSION: 1. Appropriately positioned  tunneled right internal jugular dialysis catheter. No pneumothorax. 2. Stable cardiomegaly.  Resolved interstitial pulmonary edema. Electronically Signed   By: Titus Dubin M.D.   On: 07/04/2017 14:33    I have reviewed the patient's current medications.  Assessment/Plan: 1 ESRD HD yest, plan tomorrow.  CLIP in progress. AVf?? Needs addressed 2 HTN lower wgt cont meds 3 Anemia esa/Fe 4 HPTH vit D ,cinn 5 SLE inactive P HD, esa/Fe, vit D, cinn, ? Revise access    LOS: 6 days   Jeneen Rinks Conner Neiss 07/05/2017,9:15 AM

## 2017-07-05 NOTE — Progress Notes (Signed)
Accepted at [facility] .1st treatment Tuesday,March 5,2019 at 11:00am Dialysis schedule and chairtime is Tuesday, Thursday, Saturday at 11:55 am .2nd shift

## 2017-07-05 NOTE — Telephone Encounter (Signed)
-----   Message from Mena Goes, RN sent at 07/05/2017  8:43 AM EST ----- Regarding: 4-6 weeks   ----- Message ----- From: Gabriel Earing, PA-C Sent: 07/05/2017   8:14 AM To: Vvs Charge Pool  Pt will need f/u with Dr. Bridgett Larsson in 4-6 weeks s/p [facility] AVF placement.  Thanks

## 2017-07-05 NOTE — Telephone Encounter (Signed)
Sched appt 08/03/17 at 11:15. Lm on hm# to inform pt of appt.

## 2017-07-05 NOTE — Telephone Encounter (Signed)
-----   Message from Mena Goes, RN sent at 07/05/2017  8:43 AM EST ----- Regarding: 4-6 weeks   ----- Message ----- From: Gabriel Earing, PA-C Sent: 07/05/2017   8:14 AM To: Vvs Charge Pool  Pt will need f/u with Dr. Bridgett Larsson in 4-6 weeks s/p [facility] AVF placement.  Thanks

## 2017-07-05 NOTE — Progress Notes (Signed)
PROGRESS NOTE  Kathleen [name] TSV:[id number] DOB: 07/01/1960 DOA: 06/29/2017 PCP: System, Pcp Not In  Brief Narrative: 20yow PMH lupus, CKD stage V presented with progressive SOB. Admitted by PCCM for hypertensive crisis, acute resp failure on BIPAP. Treated with BiPAP, NTG infusion. Seen by nephrology, dx with new ESRD.  Dialysis catheter was placed, hemodialysis initiated 2/25.  Vascular surgery also placed fistula with plans for outpatient follow-up.  Assessment/Plan ESRD, with metabolic acidosis, previously CKD 5 with history of lupus nephritis, with metabolic acidosis on admission - s/p HD catheter and AVF placement 2/25 -Further management per nephrology  Hypertensive emergency  -Blood pressure remains stable status post diuresis, dialysis remains stable  Essential HTN -remains stable, continue labetalol, hydralazine, felodipine   Acute resp failure secondary to above, acute diastolic CHF, treated with BiPAP and diuresis -Resolved.  Anemia of CKD, chronic disease - Hgb stable.  Anticipate discharge home when cleared by nephrology and outpatient dialysis has been secured.   DVT prophylaxis: heparin Code Status: full Family Communication: none Disposition Plan: home    [name], MD  [facility] Direct contact: [phone] --[place]ia [place]  --[email or web address]; password [email or web address]  7PM-7AM contact night coverage as above 07/05/2017, 12:03 PM  LOS: 6 days   Consultants:  PCCM  Nephrology  VVS  Procedures:  Echo Study Conclusions  - Left ventricle: The cavity size was normal. Wall thickness was   increased in a pattern of mild LVH. Systolic function was mildly   reduced. The estimated ejection fraction was in the range of 45%   to 50%. Diffuse hypokinesis. Features are consistent with a   pseudonormal left ventricular filling pattern, with concomitant   abnormal relaxation and increased filling pressure (grade 2   diastolic dysfunction). Doppler  parameters are consistent with   high ventricular filling pressure. - Aortic valve: There was moderate regurgitation. - Mitral valve: There was mild regurgitation. - Left atrium: The atrium was severely dilated. - Right atrium: The atrium was mildly dilated.  Impressions:  - Low normal to mildly reduced LV systolic function; mild LVH;   moderate diastolic dysfunction; moderate AI; mildly dilated   aortic root (44 mm); suggest CTA or MRA to further assess; mild   MR; biatrial enlargement.  2/25 per VVS 1.  Right internal jugular vein tunneled dialysis catheter placement 2.  Right internal jugular vein cannulation under ultrasound guidance 3.  Left brachiocephalic arteriovenous fistula placement  Antimicrobials:  Azithromycin 2/20 >> 2/24  Interval history/Subjective: Overall feels okay.  Objective: Vitals:  Vitals:   07/05/17 0619 07/05/17 0940  BP: (!) 184/116   Pulse: 89   Resp:    Temp: 98.4 F (36.9 C)   SpO2: 100% 98%    Exam:  Constitutional:   . Appears calm and comfortable Respiratory:  . CTA bilaterally, no w/r/r.  . Respiratory effort normal Cardiovascular:  . RRR, no m/r/g Psychiatric:  . Mental status o Mood, affect appropriate  I have personally reviewed the following:   Labs:  ALT 17  Scheduled Meds: . [START ON 07/06/2017] calcitRIOL  2 mcg Oral QODAY  . cinacalcet  60 mg Oral Q supper  . [START ON 07/08/2017] darbepoetin (ARANESP) injection - NON-DIALYSIS  200 mcg Subcutaneous Q Fri-1800  . famotidine  20 mg Oral Daily  . felodipine  10 mg Oral QHS  . gabapentin  300 mg Oral Daily  . hydroxychloroquine  200 mg Oral Daily  . ipratropium-albuterol  3 mL Nebulization TID  . labetalol  200 mg Oral  BID  . mouth rinse  15 mL Mouth Rinse BID  . multivitamin  1 tablet Oral QHS  . mycophenolate  500 mg Oral BID  . ondansetron (ZOFRAN) IV  4 mg Intravenous Once   Continuous Infusions: . sodium chloride    . sodium chloride    . sodium  chloride    . sodium chloride Stopped (07/04/17 1900)  . ferric gluconate (FERRLECIT/NULECIT) IV      Principal Problem:   Stage 5 chronic kidney disease not on chronic dialysis (HCC) Active Problems:   Hypertension   Hypertensive crisis   Benign essential HTN   LOS: 6 days

## 2017-07-05 NOTE — Progress Notes (Addendum)
  Postoperative hemodialysis access     Date of Surgery:  07/04/17 Surgeon: Bridgett Larsson  Subjective:  No complaints; says she can't feel her fistula.  Says she dialyzed via [facility] yesterday.  PHYSICAL EXAMINATION:  Vitals:   07/04/17 2220 07/05/17 0619  BP: 140/75 (!) 184/116  Pulse: 88 89  Resp:    Temp: 98.7 F (37.1 C) 98.4 F (36.9 C)  SpO2: 100% 100%    Incision is clean and dry Sensation in digits is intact;  There is not  Thrill  There is not bruit. The graft/fistula is not palpable  Left radial artery is palpable; motor and sensation are in tact left hand.   ASSESSMENT/PLAN:  Bonney Berres is a 57 y.o. year old female who is s/p left BC AVF.  -fistula by doppler has faint flow -pt does not have evidence of steal sx -will d/w Dr. Kelby Fam, PA-C [facility] [phone]  Addendum  Findings are unchanged from intraop.  Pt had a fistula with pulsatile character on doppler, consistent with proximal venous stenosis.  Some of these will dilate with time and some of these will occluded.  - F/U in office in 6 week in [place] clinic   Adele Barthel, MD, [facility] [place] Office: [phone] Pager: [phone]  07/05/2017, 9:36 AM

## 2017-07-06 LAB — CBC
HCT: 27.5 % — ABNORMAL LOW (ref 36.0–46.0)
HCT: 26.7 % — ABNORMAL LOW (ref 36.0–46.0)
HEMOGLOBIN: 8.5 g/dL — AB (ref 12.0–15.0)
Hemoglobin: 8.2 g/dL — ABNORMAL LOW (ref 12.0–15.0)
MCH: 29.3 pg (ref 26.0–34.0)
MCH: 28.9 pg (ref 26.0–34.0)
MCHC: 30.9 g/dL (ref 30.0–36.0)
MCHC: 30.7 g/dL (ref 30.0–36.0)
MCV: 94.8 fL (ref 78.0–100.0)
MCV: 94 fL (ref 78.0–100.0)
PLATELETS: 257 10*3/uL (ref 150–400)
Platelets: 273 10*3/uL (ref 150–400)
RBC: 2.9 MIL/uL — AB (ref 3.87–5.11)
RBC: 2.84 MIL/uL — ABNORMAL LOW (ref 3.87–5.11)
RDW: 15.1 % (ref 11.5–15.5)
RDW: 14.7 % (ref 11.5–15.5)
WBC: 5 10*3/uL (ref 4.0–10.5)
WBC: 5.5 10*3/uL (ref 4.0–10.5)

## 2017-07-06 LAB — RENAL FUNCTION PANEL
ALBUMIN: 3.6 g/dL (ref 3.5–5.0)
ALBUMIN: 3.5 g/dL (ref 3.5–5.0)
ANION GAP: 16 — AB (ref 5–15)
Anion gap: 13 (ref 5–15)
BUN: 65 mg/dL — ABNORMAL HIGH (ref 6–20)
BUN: 29 mg/dL — AB (ref 6–20)
CALCIUM: 8.4 mg/dL — AB (ref 8.9–10.3)
CALCIUM: 8.3 mg/dL — AB (ref 8.9–10.3)
CHLORIDE: 94 mmol/L — AB (ref 101–111)
CO2: 21 mmol/L — ABNORMAL LOW (ref 22–32)
CO2: 25 mmol/L (ref 22–32)
CREATININE: 4.36 mg/dL — AB (ref 0.44–1.00)
Chloride: 99 mmol/L — ABNORMAL LOW (ref 101–111)
Creatinine, Ser: 7.23 mg/dL — ABNORMAL HIGH (ref 0.44–1.00)
GFR calc non Af Amer: 6 mL/min — ABNORMAL LOW (ref >60)
GFR, EST AFRICAN AMERICAN: 7 mL/min — AB (ref >60)
GFR, EST AFRICAN AMERICAN: 12 mL/min — AB (ref >60)
GFR, EST NON AFRICAN AMERICAN: 10 mL/min — AB (ref >60)
GLUCOSE: 99 mg/dL (ref 65–99)
Glucose, Bld: 123 mg/dL — ABNORMAL HIGH (ref 65–99)
PHOSPHORUS: 5.9 mg/dL — AB (ref 2.5–4.6)
PHOSPHORUS: 4 mg/dL (ref 2.5–4.6)
Potassium: 4.9 mmol/L (ref 3.5–5.1)
Potassium: 4.5 mmol/L (ref 3.5–5.1)
SODIUM: 136 mmol/L (ref 135–145)
Sodium: 132 mmol/L — ABNORMAL LOW (ref 135–145)

## 2017-07-06 MED ORDER — ALTEPLASE 2 MG IJ SOLR: 2.0000 mg | Freq: Once | INTRAMUSCULAR | Status: DC | PRN

## 2017-07-06 MED ORDER — PENTAFLUOROPROP-TETRAFLUOROETH EX AERO: 1.0000 "application " | INHALATION_SPRAY | CUTANEOUS | Status: DC | PRN

## 2017-07-06 MED ORDER — HEPARIN SODIUM (PORCINE) 1000 UNIT/ML DIALYSIS: 40.0000 [IU]/kg | Freq: Once | INTRAMUSCULAR | Status: DC

## 2017-07-06 MED ORDER — LIDOCAINE-PRILOCAINE 2.5-2.5 % EX CREA: 1.0000 "application " | TOPICAL_CREAM | CUTANEOUS | Status: DC | PRN

## 2017-07-06 MED ORDER — CALCITRIOL 0.5 MCG PO CAPS
ORAL_CAPSULE | ORAL | Status: AC
  Filled 2017-07-06: qty 4

## 2017-07-06 MED ORDER — HEPARIN SODIUM (PORCINE) 1000 UNIT/ML DIALYSIS: 1000.0000 [IU] | INTRAMUSCULAR | Status: DC | PRN

## 2017-07-06 MED ORDER — SODIUM CHLORIDE 0.9 % IV SOLN: 100.0000 mL | INTRAVENOUS | Status: DC | PRN

## 2017-07-06 MED ORDER — LIDOCAINE HCL (PF) 1 % IJ SOLN: 5.0000 mL | INTRAMUSCULAR | Status: DC | PRN

## 2017-07-06 NOTE — Procedures (Signed)
I was present at this session.  I have reviewed the session itself and made appropriate changes.  BP ^, lowering vol. Cath flow low with 2nd tx.  Tol well  Jeneen Rinks Colleena Kurtenbach 2/27/20198:32 AM

## 2017-07-06 NOTE — Progress Notes (Signed)
Pt returned from dialysis.  No s/s of distress.  Denies pain or needs at this time.

## 2017-07-06 NOTE — Progress Notes (Addendum)
PROGRESS NOTE    Kathleen [name]  EXH:[id number] DOB: 18-Nov-1960 DOA: 06/29/2017 PCP: System, Pcp Not In  Brief Narrative:56yow PMH lupus, CKD stage V presented with progressive SOB. Admitted by PCCM for hypertensive crisis, acute resp failure on BIPAP. Treated with BiPAP, NTG infusion. Seen by nephrology, dx with new ESRD.  Dialysis catheter was placed, hemodialysis initiated 2/25.  Vascular surgery also placed fistula with plans for outpatient follow-up.   Assessment & Plan:   Principal Problem:   Stage 5 chronic kidney disease not on chronic dialysis [facility]) Active Problems:   Hypertension   Hypertensive crisis   Benign essential HTN  ESRD, with metabolic acidosis, previously CKD 5 with history of lupus nephritis, with metabolic acidosis on admission - s/p HD catheter and AVF placement 2/25 -Further management per nephrology - has t/thu/sat HD to start at [facility] on tue march 5th.  Hypertensive emergency  -Blood pressure remains stable status post diuresis, dialysis remains stable  Essential HTN -remains stable, continue labetalol, hydralazine, felodipine   Acute resp failure secondary to above, acute diastolic CHF, treated with BiPAP and diuresis -Resolved.  Anemia of CKD, chronic disease - Hgb stable.  Anticipate discharge home when cleared by nephrology and outpatient dialysis has been secured.    DVT prophylaxis: heparin Code Statusfull Family Communication:family member in the room. Disposition Plan:   Consultants: nephro  Subjective: No complaints had dialysis. Objective: Vitals:   07/06/17 0930 07/06/17 1000 07/06/17 1007 07/06/17 1225  BP: (!) 159/85 (!) 155/80 (!) 151/92 (!) 146/101  Pulse: 71 75 76 81  Resp: 19 18 18 18   Temp:   98.4 F (36.9 C) 97.7 F (36.5 C)  TempSrc:   Oral Oral  SpO2:   95% 100%  Weight:   51 kg (112 lb 7 oz)   Height:        Intake/Output Summary (Last 24 hours) at 07/06/2017 1326 Last data filed at 07/06/2017  1007 Gross per 24 hour  Intake 120 ml  Output 1000 ml  Net -880 ml   Filed Weights   07/06/17 0500 07/06/17 0655 07/06/17 1007  Weight: 54.8 kg (120 lb 13 oz) 52 kg (114 lb 10.2 oz) 51 kg (112 lb 7 oz)    Examination:  General exam: Appears calm and comfortable  Respiratory system: Clear to auscultation. Respiratory effort normal. Cardiovascular system: S1 & S2 heard, RRR. No JVD, murmurs, rubs, gallops or clicks. No pedal edema. Gastrointestinal system: Abdomen is nondistended, soft and nontender. No organomegaly or masses felt. Normal bowel sounds heard. Central nervous system: Alert and oriented. No focal neurological deficits. Extremities: Symmetric 5 x 5 power. Skin: No rashes, lesions or ulcers Psychiatry: Judgement and insight appear normal. Mood & affect appropriate.     Data Reviewed: I have personally reviewed following labs and imaging studies  CBC: Recent Labs  Lab 06/30/17 0257 07/03/17 1132 07/04/17 0216 07/06/17 0624  WBC 5.7 4.6 5.6 5.0  HGB 7.6* 9.1* 8.6* 8.5*  HCT 24.7* 28.8* 27.9* 27.5*  MCV 92.5 92.6 92.1 94.8  PLT 261 275 309 381   Basic Metabolic Panel: Recent Labs  Lab 06/30/17 0257 07/02/17 0245 07/03/17 1132 07/04/17 0216 07/06/17 0624  NA 138 134* 133* 136 136  K 5.1 5.1 5.1 5.6* 4.9  CL 105 98* 97* 97* 99*  CO2 17* 17* 18* 19* 21*  GLUCOSE 112* 87 97 95 99  BUN 65* 76* 86* 102* 65*  CREATININE 7.24* 7.80* 8.71* 8.87* 7.23*  CALCIUM 8.9 8.6* 8.9 9.0 8.4*  PHOS 5.0* 5.9* 6.4*  --  5.9*   GFR: Estimated Creatinine Clearance: 7 mL/min (A) (by C-G formula based on SCr of 7.23 mg/dL (H)). Liver Function Tests: Recent Labs  Lab 07/02/17 0245 07/03/17 1132 07/04/17 1653 07/06/17 0624  ALT  --   --  17  --   ALBUMIN 3.4* 3.6  --  3.6   No results for input(s): LIPASE, AMYLASE in the last 168 hours. No results for input(s): AMMONIA in the last 168 hours. Coagulation Profile: Recent Labs  Lab 07/04/17 0216  INR 1.04    Cardiac Enzymes: No results for input(s): CKTOTAL, CKMB, CKMBINDEX, TROPONINI in the last 168 hours. BNP (last 3 results) No results for input(s): PROBNP in the last 8760 hours. HbA1C: No results for input(s): HGBA1C in the last 72 hours. CBG: Recent Labs  Lab 06/29/17 1717 07/01/17 1345  GLUCAP 126* 116*   Lipid Profile: No results for input(s): CHOL, HDL, LDLCALC, TRIG, CHOLHDL, LDLDIRECT in the last 72 hours. Thyroid Function Tests: No results for input(s): TSH, T4TOTAL, FREET4, T3FREE, THYROIDAB in the last 72 hours. Anemia Panel: No results for input(s): VITAMINB12, FOLATE, FERRITIN, TIBC, IRON, RETICCTPCT in the last 72 hours. Sepsis Labs: No results for input(s): PROCALCITON, LATICACIDVEN in the last 168 hours.  Recent Results (from the past 240 hour(s))  Blood culture (routine x 2)     Status: None   Collection Time: 06/29/17 10:48 AM  Result Value Ref Range Status   Specimen Description BLOOD LEFT HAND  Final   Special Requests IN PEDIATRIC BOTTLE Blood Culture adequate volume  Final   Culture   Final    NO GROWTH 5 DAYS Performed at [facility], [place] [address], [place], [place] [address]    Report Status 07/04/2017 FINAL  Final  Blood culture (routine x 2)     Status: None   Collection Time: 06/29/17 11:02 AM  Result Value Ref Range Status   Specimen Description BLOOD LEFT FOREARM  Final   Special Requests IN PEDIATRIC BOTTLE Blood Culture adequate volume  Final   Culture   Final    NO GROWTH 5 DAYS Performed at [facility], [place] [address] [place], [place] [address]    Report Status 07/04/2017 FINAL  Final  Respiratory Panel by PCR     Status: None   Collection Time: 06/29/17 11:05 AM  Result Value Ref Range Status   Adenovirus NOT DETECTED NOT DETECTED Final   Coronavirus 229E NOT DETECTED NOT DETECTED Final   Coronavirus HKU1 NOT DETECTED NOT DETECTED Final   Coronavirus NL63 NOT DETECTED NOT DETECTED Final   Coronavirus OC43 NOT  DETECTED NOT DETECTED Final   Metapneumovirus NOT DETECTED NOT DETECTED Final   Rhinovirus / Enterovirus NOT DETECTED NOT DETECTED Final   Influenza A NOT DETECTED NOT DETECTED Final   Influenza B NOT DETECTED NOT DETECTED Final   Parainfluenza Virus 1 NOT DETECTED NOT DETECTED Final   Parainfluenza Virus 2 NOT DETECTED NOT DETECTED Final   Parainfluenza Virus 3 NOT DETECTED NOT DETECTED Final   Parainfluenza Virus 4 NOT DETECTED NOT DETECTED Final   Respiratory Syncytial Virus NOT DETECTED NOT DETECTED Final   Bordetella pertussis NOT DETECTED NOT DETECTED Final   Chlamydophila pneumoniae NOT DETECTED NOT DETECTED Final   Mycoplasma pneumoniae NOT DETECTED NOT DETECTED Final    Comment: Performed at [facility], [place] [address] [place], [place] [address]  MRSA PCR Screening     Status: None  Collection Time: 06/29/17  5:40 PM  Result Value Ref Range Status   MRSA by PCR NEGATIVE NEGATIVE Final    Comment:        The GeneXpert MRSA Assay (FDA approved for NASAL specimens only), is one component of a comprehensive MRSA colonization surveillance program. It is not intended to diagnose MRSA infection nor to guide or monitor treatment for MRSA infections. Performed at [facility], [place] [address], [place], [place] [address]          Radiology Studies: Dg Chest Port 1 View  Result Date: 07/04/2017 CLINICAL DATA:  Dialysis catheter insertion. EXAM: PORTABLE CHEST 1 VIEW COMPARISON:  Chest x-ray dated June 29, 2017. FINDINGS: Interval placement of a right internal jugular tunnel dialysis catheter with the tip at the cavoatrial junction. Stable cardiomegaly. Resolved interstitial edema. No focal consolidation, pleural effusion, or pneumothorax. No acute osseous abnormality. IMPRESSION: 1. Appropriately positioned tunneled right internal jugular dialysis catheter. No pneumothorax. 2. Stable cardiomegaly.  Resolved interstitial pulmonary edema. Electronically Signed    By: Titus Dubin M.D.   On: 07/04/2017 14:33        Scheduled Meds: . calcitRIOL  2 mcg Oral QODAY  . cinacalcet  60 mg Oral Q supper  . [START ON 07/08/2017] darbepoetin (ARANESP) injection - NON-DIALYSIS  200 mcg Subcutaneous Q Fri-1800  . famotidine  20 mg Oral Daily  . felodipine  10 mg Oral QHS  . gabapentin  300 mg Oral Daily  . heparin  40 Units/kg Dialysis Once in dialysis  . hydroxychloroquine  200 mg Oral Daily  . ipratropium-albuterol  3 mL Nebulization BID  . labetalol  200 mg Oral BID  . mouth rinse  15 mL Mouth Rinse BID  . multivitamin  1 tablet Oral QHS  . mycophenolate  500 mg Oral BID  . ondansetron (ZOFRAN) IV  4 mg Intravenous Once   Continuous Infusions: . sodium chloride    . sodium chloride    . sodium chloride    . sodium chloride Stopped (07/04/17 1900)  . sodium chloride    . sodium chloride    . ferric gluconate (FERRLECIT/NULECIT) IV       LOS: 7 days      Georgette Shell, MD  If 7PM-7AM, please contact night-coverage [email or web address] Password [email or web address] 07/06/2017, 1:26 PM

## 2017-07-06 NOTE — Progress Notes (Signed)
Rec'd report and assumed care of pt; however, pt in dialysis at this time.

## 2017-07-06 NOTE — Progress Notes (Signed)
Subjective: Interval History: has complaints confused about insurance.  .  Objective: Vital signs in last 24 hours: Temp:  [97.8 F (36.6 C)-98.5 F (36.9 C)] 98.2 F (36.8 C) (02/27 0655) Pulse Rate:  [72-82] 72 (02/27 0800) Resp:  [13-15] 13 (02/27 0800) BP: (152-189)/(83-102) 163/85 (02/27 0800) SpO2:  [95 %-100 %] 95 % (02/27 0655) Weight:  [52 kg (114 lb 10.2 oz)-54.8 kg (120 lb 13 oz)] 52 kg (114 lb 10.2 oz) (02/27 0655) Weight change: 0.2 kg (7.1 oz)  Intake/Output from previous day: 02/26 0701 - 02/27 0700 In: 240 [P.O.:240] Out: -  Intake/Output this shift: No intake/output data recorded.  General appearance: alert, cooperative and no distress Resp: clear to auscultation bilaterally Chest wall: RIJ cath Cardio: S1, S2 normal and systolic murmur: holosystolic 2/6, blowing at apex GI: pos bs, liver down 4 cm Extremities: AVF LUA cannot hear  Lab Results: Recent Labs    07/04/17 0216 07/06/17 0624  WBC 5.6 5.0  HGB 8.6* 8.5*  HCT 27.9* 27.5*  PLT 309 273   BMET:  Recent Labs    07/04/17 0216 07/06/17 0624  NA 136 136  K 5.6* 4.9  CL 97* 99*  CO2 19* 21*  GLUCOSE 95 99  BUN 102* 65*  CREATININE 8.87* 7.23*  CALCIUM 9.0 8.4*   No results for input(s): PTH in the last 72 hours. Iron Studies: No results for input(s): IRON, TIBC, TRANSFERRIN, FERRITIN in the last 72 hours.  Studies/Results: Dg Chest Port 1 View  Result Date: 07/04/2017 CLINICAL DATA:  Dialysis catheter insertion. EXAM: PORTABLE CHEST 1 VIEW COMPARISON:  Chest x-ray dated June 29, 2017. FINDINGS: Interval placement of a right internal jugular tunnel dialysis catheter with the tip at the cavoatrial junction. Stable cardiomegaly. Resolved interstitial edema. No focal consolidation, pleural effusion, or pneumothorax. No acute osseous abnormality. IMPRESSION: 1. Appropriately positioned tunneled right internal jugular dialysis catheter. No pneumothorax. 2. Stable cardiomegaly.  Resolved  interstitial pulmonary edema. Electronically Signed   By: Titus Dubin M.D.   On: 07/04/2017 14:33    I have reviewed the patient's current medications.  Assessment/Plan: 1 ESRD for HD.  Has spot at [facility] , will see if can get in this week, supposedly on Tues but will see if can do earlier 2 Anemia esa/Fe 3 HPTH vit D, cinn 4 SLE inactive, lower meds 5 HTN lower meds P HD, esa/Fe, see if can get to [place] this week.    LOS: 7 days   Jeneen Rinks Rana Hochstein 07/06/2017,8:33 AM

## 2017-07-07 MED ORDER — CINACALCET HCL 30 MG PO TABS: 60.0000 mg | ORAL_TABLET | Freq: Every day | ORAL | 0 refills | Status: DC

## 2017-07-07 MED ORDER — RENA-VITE PO TABS: 1.0000 | ORAL_TABLET | Freq: Every day | ORAL | 0 refills | Status: DC

## 2017-07-07 MED ORDER — CALCITRIOL 0.5 MCG PO CAPS: 2.0000 ug | ORAL_CAPSULE | ORAL | 0 refills | Status: DC

## 2017-07-07 MED ORDER — LIDOCAINE-PRILOCAINE 2.5-2.5 % EX CREA: 1.0000 "application " | TOPICAL_CREAM | CUTANEOUS | 0 refills | Status: DC | PRN

## 2017-07-07 NOTE — Progress Notes (Signed)
Was told in report pt getting HD and then d/c. See d/c orders, however per HD RN,pt not on schedule. Spoke w/ Dr. Jimmy Footman, Nephrology who states pt is ok to d/c to home w/out HD and will get outpatient HD on Saturday.

## 2017-07-07 NOTE — Progress Notes (Signed)
D/c paperwork, meds, and follow up instructions reviewed w/ pt. Pt states not questions or concerns. Pt to d/c to home. Bus pass provided for pt by CSW.

## 2017-07-07 NOTE — Progress Notes (Signed)
Subjective: Interval History: none, Ready to go home  Objective: Vital signs in last 24 hours: Temp:  [97.7 F (36.5 C)-98.9 F (37.2 C)] 98.4 F (36.9 C) (02/28 0636) Pulse Rate:  [71-96] 82 (02/28 0636) Resp:  [18-19] 18 (02/27 2011) BP: (112-167)/(66-101) 112/72 (02/28 0636) SpO2:  [95 %-100 %] 100 % (02/28 0636) Weight:  [51 kg (112 lb 7 oz)-51.2 kg (112 lb 14 oz)] 51.2 kg (112 lb 14 oz) (02/28 0636) Weight change: -3.8 kg (-6 oz)  Intake/Output from previous day: 02/27 0701 - 02/28 0700 In: 240 [P.O.:240] Out: 1000  Intake/Output this shift: No intake/output data recorded.  General appearance: alert, cooperative and no distress Resp: clear to auscultation bilaterally Chest wall: RIJ PC Cardio: S1, S2 normal and systolic murmur: systolic ejection 2/6, decrescendo at 2nd left intercostal space GI: soft, non-tender; bowel sounds normal; no masses,  no organomegaly Extremities: AVF LUA , cannot hear  Lab Results: Recent Labs    07/06/17 0624 07/06/17 1519  WBC 5.0 5.5  HGB 8.5* 8.2*  HCT 27.5* 26.7*  PLT 273 257   BMET:  Recent Labs    07/06/17 0624 07/06/17 1519  NA 136 132*  K 4.9 4.5  CL 99* 94*  CO2 21* 25  GLUCOSE 99 123*  BUN 65* 29*  CREATININE 7.23* 4.36*  CALCIUM 8.4* 8.3*   No results for input(s): PTH in the last 72 hours. Iron Studies: No results for input(s): IRON, TIBC, TRANSFERRIN, FERRITIN in the last 72 hours.  Studies/Results: No results found.  I have reviewed the patient's current medications.  Assessment/Plan: 1 ESRD for HD at [facility] on Sat.  Ok to go home 2 Anemia esa/Fe 3 HPTH vit D, cinn 4 SLE inactive have lowered meds 5 BP better lowered meds P HD on Sat outpatient, stop labetolol    LOS: 8 days   Kathleen [name] 07/07/2017,8:01 AM

## 2017-07-07 NOTE — Discharge Summary (Signed)
Physician Discharge Summary  Kathleen [name] BZJ:[id number] DOB: 1960-10-11 DOA: 06/29/2017  PCP: System, Pcp Not In  Admit date: 06/29/2017 Discharge date: 07/07/2017  Admitted From: Home Disposition: Home  Recommendations for Outpatient Follow-up:  1. Follow up with PCP in 1-2 weeks 2. Please obtain BMP/CBC in one week Home Health none Equipment/Devices: None Discharge Condition stable CODE STATUS: Full code Diet recommendation: Renal diet Brief/Interim Summary::56yow PMH lupus, CKD stage V presented with progressive SOB. Admitted by PCCM for hypertensive crisis, acute resp failure on BIPAP. Treated with BiPAP, NTG infusion. Seen by nephrology, dx with new ESRD. Dialysis catheter was placed, hemodialysis initiated 2/25. Vascular surgery also placed fistula with plans for outpatient follow-up.    Discharge Diagnoses:  Principal Problem:   Stage 5 chronic kidney disease not on chronic dialysis [facility]) Active Problems:   Hypertension   Hypertensive crisis   Benign essential HTN  ESRD,with metabolic acidosis, previously CKD 5 with history of lupus nephritis, with metabolic acidosis on admission - s/p HD catheter and AVF placement 2/25 -Further management per nephrology - has t/thu/sat HD to start at [facility] on tue march 5th.  DC home today patient will get outpatient dialysis start starting this Saturday. Hypertensive emergency  -Blood pressure remains stable status post diuresis, dialysis remains stable  Essential HTN -remains stable,continuelabetalol, hydralazine, felodipine   Acute resp failure secondary to above, acute diastolic CHF, treated with BiPAP and diuresis -Resolved.  Anemia of CKD, chronic disease - Hgb stable.       Discharge Instructions  Discharge Instructions    Call MD for:  difficulty breathing, headache or visual disturbances   Complete by:  As directed    Call MD for:  persistant dizziness or light-headedness   Complete by:  As  directed    Call MD for:  persistant nausea and vomiting   Complete by:  As directed    Call MD for:  severe uncontrolled pain   Complete by:  As directed    Call MD for:  temperature >100.4   Complete by:  As directed    Diet - low sodium heart healthy   Complete by:  As directed    Increase activity slowly   Complete by:  As directed      Allergies as of 07/07/2017   No Known Allergies     Medication List    STOP taking these medications   labetalol 200 MG tablet Commonly known as:  NORMODYNE     TAKE these medications   calcitRIOL 0.5 MCG capsule Commonly known as:  ROCALTROL Take 4 capsules (2 mcg total) by mouth every other day. Start taking on:  07/08/2017   cinacalcet 30 MG tablet Commonly known as:  SENSIPAR Take 2 tablets (60 mg total) by mouth daily with supper.   felodipine 5 MG 24 hr tablet Commonly known as:  PLENDIL Take 2 tablets (10 mg total) by mouth daily.   ferrous gluconate 324 MG tablet Commonly known as:  FERGON Take 324 mg by mouth daily.   gabapentin 300 MG capsule Commonly known as:  NEURONTIN Take 300 mg by mouth daily.   hydrALAZINE 25 MG tablet Commonly known as:  APRESOLINE Take 1 tablet (25 mg total) by mouth every 8 (eight) hours.   hydroxychloroquine 200 MG tablet Commonly known as:  PLAQUENIL Take 1.5 tablets (300 mg total) by mouth daily. What changed:  how much to take   lidocaine-prilocaine cream Commonly known as:  EMLA Apply 1 application topically as needed (topical anesthesia  for hemodialysis if Gebauers and Lidocaine injection are ineffective.).   multivitamin Tabs tablet Take 1 tablet by mouth at bedtime.   mycophenolate 500 MG tablet Commonly known as:  CELLCEPT Take 2 tablets (1,000 mg total) by mouth 2 (two) times daily.   ranitidine 150 MG tablet Commonly known as:  ZANTAC Take 150 mg by mouth daily.   sevelamer carbonate 800 MG tablet Commonly known as:  RENVELA Take 800 mg by mouth 3 (three) times  daily with meals.   sodium bicarbonate 650 MG tablet Take 1 tablet (650 mg total) by mouth daily.      Follow-up Information    Angela Burke, NP Follow up on 07/11/2017.   Specialty:  Internal Medicine Why:  9:30 for hospital follow up Contact information: [address] [place] [address] [phone]        Conrad Olivia, MD Follow up in 6 week(s).   Specialties:  Vascular Surgery, Cardiology Why:  Office will call you to arrange your appt (sent) Contact information: [place] [address] [phone]        Mauricia Area, MD Follow up.   Specialty:  Nephrology Contact information: [place] [address] [phone]          No Known Allergies  Consultations:  RENAL   Procedures/Studies: Dg Chest Port 1 View  Result Date: 07/04/2017 CLINICAL DATA:  Dialysis catheter insertion. EXAM: PORTABLE CHEST 1 VIEW COMPARISON:  Chest x-ray dated June 29, 2017. FINDINGS: Interval placement of a right internal jugular tunnel dialysis catheter with the tip at the cavoatrial junction. Stable cardiomegaly. Resolved interstitial edema. No focal consolidation, pleural effusion, or pneumothorax. No acute osseous abnormality. IMPRESSION: 1. Appropriately positioned tunneled right internal jugular dialysis catheter. No pneumothorax. 2. Stable cardiomegaly.  Resolved interstitial pulmonary edema. Electronically Signed   By: Titus Dubin M.D.   On: 07/04/2017 14:33   Dg Chest Port 1 View  Result Date: 06/29/2017 CLINICAL DATA:  Shortness of breath, COPD EXAM: PORTABLE CHEST 1 VIEW COMPARISON:  07/08/2016 FINDINGS: Hyperinflated lungs as can be seen with COPD. Diffuse bilateral interstitial thickening. Small right pleural effusion. No left pleural effusion. No pneumothorax. Stable cardiomegaly. No acute osseous abnormality. IMPRESSION: Mild CHF. Electronically Signed   By: Kathreen Devoid   On: 06/29/2017 10:15    (Echo,  Carotid, EGD, Colonoscopy, ERCP)    Subjective:   Discharge Exam: Vitals:   07/07/17 0636 07/07/17 0829  BP: 112/72   Pulse: 82   Resp:    Temp: 98.4 F (36.9 C)   SpO2: 100% 97%   Vitals:   07/06/17 2226 07/07/17 0500 07/07/17 0636 07/07/17 0829  BP: (!) 113/100  112/72   Pulse: 96  82   Resp:      Temp: 98.9 F (37.2 C)  98.4 F (36.9 C)   TempSrc: Oral  Oral   SpO2: 100%  100% 97%  Weight:  51.2 kg (112 lb 14 oz) 51.2 kg (112 lb 14 oz)   Height:        General: Pt is alert, awake, not in acute distress Cardiovascular: RRR, S1/S2 +, no rubs, no gallops Respiratory: CTA bilaterally, no wheezing, no rhonchi Abdominal: Soft, NT, ND, bowel sounds + Extremities: no edema, no cyanosis    The results of significant diagnostics from this hospitalization (including imaging, microbiology, ancillary and laboratory) are listed below for reference.     Microbiology: Recent Results (from the past 240 hour(s))  Blood culture (  routine x 2)     Status: None   Collection Time: 06/29/17 10:48 AM  Result Value Ref Range Status   Specimen Description BLOOD LEFT HAND  Final   Special Requests IN PEDIATRIC BOTTLE Blood Culture adequate volume  Final   Culture   Final    NO GROWTH 5 DAYS Performed at [facility], [place] [address] [place], [place] [address]    Report Status 07/04/2017 FINAL  Final  Blood culture (routine x 2)     Status: None   Collection Time: 06/29/17 11:02 AM  Result Value Ref Range Status   Specimen Description BLOOD LEFT FOREARM  Final   Special Requests IN PEDIATRIC BOTTLE Blood Culture adequate volume  Final   Culture   Final    NO GROWTH 5 DAYS Performed at [facility], [place] [address] [place], [place] [address]    Report Status 07/04/2017 FINAL  Final  Respiratory Panel by PCR     Status: None   Collection Time: 06/29/17 11:05 AM  Result Value Ref Range Status   Adenovirus NOT DETECTED NOT DETECTED Final   Coronavirus 229E NOT  DETECTED NOT DETECTED Final   Coronavirus HKU1 NOT DETECTED NOT DETECTED Final   Coronavirus NL63 NOT DETECTED NOT DETECTED Final   Coronavirus OC43 NOT DETECTED NOT DETECTED Final   Metapneumovirus NOT DETECTED NOT DETECTED Final   Rhinovirus / Enterovirus NOT DETECTED NOT DETECTED Final   Influenza A NOT DETECTED NOT DETECTED Final   Influenza B NOT DETECTED NOT DETECTED Final   Parainfluenza Virus 1 NOT DETECTED NOT DETECTED Final   Parainfluenza Virus 2 NOT DETECTED NOT DETECTED Final   Parainfluenza Virus 3 NOT DETECTED NOT DETECTED Final   Parainfluenza Virus 4 NOT DETECTED NOT DETECTED Final   Respiratory Syncytial Virus NOT DETECTED NOT DETECTED Final   Bordetella pertussis NOT DETECTED NOT DETECTED Final   Chlamydophila pneumoniae NOT DETECTED NOT DETECTED Final   Mycoplasma pneumoniae NOT DETECTED NOT DETECTED Final    Comment: Performed at [facility], [place] [address], [place], [place] [address]  MRSA PCR Screening     Status: None   Collection Time: 06/29/17  5:40 PM  Result Value Ref Range Status   MRSA by PCR NEGATIVE NEGATIVE Final    Comment:        The GeneXpert MRSA Assay (FDA approved for NASAL specimens only), is one component of a comprehensive MRSA colonization surveillance program. It is not intended to diagnose MRSA infection nor to guide or monitor treatment for MRSA infections. Performed at [facility], [place] [address], [place], [place] [address]      Labs: BNP (last 3 results) Recent Labs    06/29/17 1057  BNP 643.3*   Basic Metabolic Panel: Recent Labs  Lab 07/02/17 0245 07/03/17 1132 07/04/17 0216 07/06/17 0624 07/06/17 1519  NA 134* 133* 136 136 132*  K 5.1 5.1 5.6* 4.9 4.5  CL 98* 97* 97* 99* 94*  CO2 17* 18* 19* 21* 25  GLUCOSE 87 97 95 99 123*  BUN 76* 86* 102* 65* 29*  CREATININE 7.80* 8.71* 8.87* 7.23* 4.36*  CALCIUM 8.6* 8.9 9.0 8.4* 8.3*  PHOS 5.9* 6.4*  --  5.9* 4.0   Liver Function Tests: Recent Labs  Lab  07/02/17 0245 07/03/17 1132 07/04/17 1653 07/06/17 0624 07/06/17 1519  ALT  --   --  17  --   --   ALBUMIN 3.4* 3.6  --  3.6  3.5   No results for input(s): LIPASE, AMYLASE in the last 168 hours. No results for input(s): AMMONIA in the last 168 hours. CBC: Recent Labs  Lab 07/03/17 1132 07/04/17 0216 07/06/17 0624 07/06/17 1519  WBC 4.6 5.6 5.0 5.5  HGB 9.1* 8.6* 8.5* 8.2*  HCT 28.8* 27.9* 27.5* 26.7*  MCV 92.6 92.1 94.8 94.0  PLT 275 309 273 257   Cardiac Enzymes: No results for input(s): CKTOTAL, CKMB, CKMBINDEX, TROPONINI in the last 168 hours. BNP: Invalid input(s): POCBNP CBG: Recent Labs  Lab 07/01/17 1345  GLUCAP 116*   D-Dimer No results for input(s): DDIMER in the last 72 hours. Hgb A1c No results for input(s): HGBA1C in the last 72 hours. Lipid Profile No results for input(s): CHOL, HDL, LDLCALC, TRIG, CHOLHDL, LDLDIRECT in the last 72 hours. Thyroid function studies No results for input(s): TSH, T4TOTAL, T3FREE, THYROIDAB in the last 72 hours.  Invalid input(s): FREET3 Anemia work up No results for input(s): VITAMINB12, FOLATE, FERRITIN, TIBC, IRON, RETICCTPCT in the last 72 hours. Urinalysis    Component Value Date/Time   COLORURINE STRAW (A) 06/29/2017 1428   APPEARANCEUR CLEAR 06/29/2017 1428   LABSPEC 1.009 06/29/2017 1428   PHURINE 6.0 06/29/2017 1428   GLUCOSEU NEGATIVE 06/29/2017 1428   HGBUR SMALL (A) 06/29/2017 1428   BILIRUBINUR NEGATIVE 06/29/2017 1428   KETONESUR NEGATIVE 06/29/2017 1428   PROTEINUR 100 (A) 06/29/2017 1428   NITRITE NEGATIVE 06/29/2017 1428   LEUKOCYTESUR NEGATIVE 06/29/2017 1428   Sepsis Labs Invalid input(s): PROCALCITONIN,  WBC,  LACTICIDVEN Microbiology Recent Results (from the past 240 hour(s))  Blood culture (routine x 2)     Status: None   Collection Time: 06/29/17 10:48 AM  Result Value Ref Range Status   Specimen Description BLOOD LEFT HAND  Final   Special Requests IN PEDIATRIC BOTTLE Blood Culture  adequate volume  Final   Culture   Final    NO GROWTH 5 DAYS Performed at [facility], [place] [address] [place], [place] [address]    Report Status 07/04/2017 FINAL  Final  Blood culture (routine x 2)     Status: None   Collection Time: 06/29/17 11:02 AM  Result Value Ref Range Status   Specimen Description BLOOD LEFT FOREARM  Final   Special Requests IN PEDIATRIC BOTTLE Blood Culture adequate volume  Final   Culture   Final    NO GROWTH 5 DAYS Performed at [facility], [place] [address] [place], [place] [address]    Report Status 07/04/2017 FINAL  Final  Respiratory Panel by PCR     Status: None   Collection Time: 06/29/17 11:05 AM  Result Value Ref Range Status   Adenovirus NOT DETECTED NOT DETECTED Final   Coronavirus 229E NOT DETECTED NOT DETECTED Final   Coronavirus HKU1 NOT DETECTED NOT DETECTED Final   Coronavirus NL63 NOT DETECTED NOT DETECTED Final   Coronavirus OC43 NOT DETECTED NOT DETECTED Final   Metapneumovirus NOT DETECTED NOT DETECTED Final   Rhinovirus / Enterovirus NOT DETECTED NOT DETECTED Final   Influenza A NOT DETECTED NOT DETECTED Final   Influenza B NOT DETECTED NOT DETECTED Final   Parainfluenza Virus 1 NOT DETECTED NOT DETECTED Final   Parainfluenza Virus 2 NOT DETECTED NOT DETECTED Final   Parainfluenza Virus 3 NOT DETECTED NOT DETECTED Final   Parainfluenza Virus 4 NOT DETECTED NOT DETECTED Final   Respiratory Syncytial Virus NOT DETECTED NOT DETECTED Final   Bordetella pertussis NOT DETECTED NOT DETECTED  Final   Chlamydophila pneumoniae NOT DETECTED NOT DETECTED Final   Mycoplasma pneumoniae NOT DETECTED NOT DETECTED Final    Comment: Performed at [facility], [place] [address] [place], [place] [address]  MRSA PCR Screening     Status: None   Collection Time: 06/29/17  5:40 PM  Result Value Ref Range Status   MRSA by PCR NEGATIVE NEGATIVE Final    Comment:        The GeneXpert MRSA Assay (FDA approved for NASAL specimens only),  is one component of a comprehensive MRSA colonization surveillance program. It is not intended to diagnose MRSA infection nor to guide or monitor treatment for MRSA infections. Performed at [facility], [place] [address], [place], [place] [address]      Time coordinating discharge: Over 30 minutes  SIGNED:   Georgette Shell, MD  [facility] 07/07/2017, 10:54 AM Pager   If 7PM-7AM, please contact night-coverage [email or web address] Password [email or web address]

## 2017-07-09 DIAGNOSIS — Z4901 Encounter for fitting and adjustment of extracorporeal dialysis catheter: Secondary | ICD-10-CM | POA: Insufficient documentation

## 2017-07-09 DIAGNOSIS — M3214 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: Secondary | ICD-10-CM | POA: Insufficient documentation

## 2017-07-09 DIAGNOSIS — Z720 Tobacco use: Secondary | ICD-10-CM | POA: Insufficient documentation

## 2017-07-09 DIAGNOSIS — D689 Coagulation defect, unspecified: Secondary | ICD-10-CM | POA: Insufficient documentation

## 2017-07-09 DIAGNOSIS — D509 Iron deficiency anemia, unspecified: Secondary | ICD-10-CM | POA: Insufficient documentation

## 2017-07-09 DIAGNOSIS — N2581 Secondary hyperparathyroidism of renal origin: Secondary | ICD-10-CM | POA: Insufficient documentation

## 2017-07-11 ENCOUNTER — Encounter (HOSPITAL_COMMUNITY): Payer: Self-pay | Admitting: Internal Medicine

## 2017-07-11 ENCOUNTER — Emergency Department (HOSPITAL_COMMUNITY): Payer: Medicare Other

## 2017-07-11 ENCOUNTER — Other Ambulatory Visit: Payer: Self-pay

## 2017-07-11 ENCOUNTER — Inpatient Hospital Stay (HOSPITAL_COMMUNITY)
Admission: EM | Admit: 2017-07-11 | Discharge: 2017-07-13 | DRG: 304 | Disposition: A | Discharge status: Home Health | Payer: Medicare Other | Attending: Internal Medicine | Admitting: Internal Medicine

## 2017-07-11 DIAGNOSIS — Z8673 Personal history of transient ischemic attack (TIA), and cerebral infarction without residual deficits: Secondary | ICD-10-CM | POA: Diagnosis not present

## 2017-07-11 DIAGNOSIS — I161 Hypertensive emergency: Secondary | ICD-10-CM | POA: Diagnosis not present

## 2017-07-11 DIAGNOSIS — J449 Chronic obstructive pulmonary disease, unspecified: Secondary | ICD-10-CM | POA: Diagnosis present

## 2017-07-11 DIAGNOSIS — D631 Anemia in chronic kidney disease: Secondary | ICD-10-CM

## 2017-07-11 DIAGNOSIS — Z915 Personal history of self-harm: Secondary | ICD-10-CM | POA: Diagnosis not present

## 2017-07-11 DIAGNOSIS — N186 End stage renal disease: Secondary | ICD-10-CM | POA: Diagnosis present

## 2017-07-11 DIAGNOSIS — I132 Hypertensive heart and chronic kidney disease with heart failure and with stage 5 chronic kidney disease, or end stage renal disease: Secondary | ICD-10-CM | POA: Diagnosis present

## 2017-07-11 DIAGNOSIS — I160 Hypertensive urgency: Secondary | ICD-10-CM

## 2017-07-11 DIAGNOSIS — E8889 Other specified metabolic disorders: Secondary | ICD-10-CM | POA: Diagnosis present

## 2017-07-11 DIAGNOSIS — R1084 Generalized abdominal pain: Secondary | ICD-10-CM | POA: Diagnosis not present

## 2017-07-11 DIAGNOSIS — G9341 Metabolic encephalopathy: Secondary | ICD-10-CM | POA: Diagnosis present

## 2017-07-11 DIAGNOSIS — Z91199 Patient's noncompliance with other medical treatment and regimen due to unspecified reason: Secondary | ICD-10-CM

## 2017-07-11 DIAGNOSIS — F17210 Nicotine dependence, cigarettes, uncomplicated: Secondary | ICD-10-CM | POA: Diagnosis present

## 2017-07-11 DIAGNOSIS — Z8674 Personal history of sudden cardiac arrest: Secondary | ICD-10-CM

## 2017-07-11 DIAGNOSIS — Z9119 Patient's noncompliance with other medical treatment and regimen: Secondary | ICD-10-CM

## 2017-07-11 DIAGNOSIS — M1990 Unspecified osteoarthritis, unspecified site: Secondary | ICD-10-CM | POA: Diagnosis present

## 2017-07-11 DIAGNOSIS — Z79899 Other long term (current) drug therapy: Secondary | ICD-10-CM | POA: Diagnosis not present

## 2017-07-11 DIAGNOSIS — R4182 Altered mental status, unspecified: Secondary | ICD-10-CM

## 2017-07-11 DIAGNOSIS — Z681 Body mass index (BMI) 19 or less, adult: Secondary | ICD-10-CM

## 2017-07-11 DIAGNOSIS — I169 Hypertensive crisis, unspecified: Secondary | ICD-10-CM | POA: Diagnosis present

## 2017-07-11 DIAGNOSIS — E440 Moderate protein-calorie malnutrition: Secondary | ICD-10-CM | POA: Diagnosis present

## 2017-07-11 DIAGNOSIS — G9340 Encephalopathy, unspecified: Secondary | ICD-10-CM | POA: Diagnosis not present

## 2017-07-11 DIAGNOSIS — R109 Unspecified abdominal pain: Secondary | ICD-10-CM

## 2017-07-11 DIAGNOSIS — J441 Chronic obstructive pulmonary disease with (acute) exacerbation: Secondary | ICD-10-CM | POA: Insufficient documentation

## 2017-07-11 DIAGNOSIS — Z992 Dependence on renal dialysis: Secondary | ICD-10-CM

## 2017-07-11 DIAGNOSIS — IMO0002 Reserved for concepts with insufficient information to code with codable children: Secondary | ICD-10-CM | POA: Diagnosis present

## 2017-07-11 DIAGNOSIS — J410 Simple chronic bronchitis: Secondary | ICD-10-CM

## 2017-07-11 DIAGNOSIS — M329 Systemic lupus erythematosus, unspecified: Secondary | ICD-10-CM | POA: Diagnosis present

## 2017-07-11 DIAGNOSIS — D649 Anemia, unspecified: Secondary | ICD-10-CM | POA: Diagnosis present

## 2017-07-11 HISTORY — DX: Dependence on renal dialysis: Z99.2

## 2017-07-11 HISTORY — DX: End stage renal disease: N18.6

## 2017-07-11 LAB — CBG MONITORING, ED: Glucose-Capillary: 85 mg/dL (ref 65–99)

## 2017-07-11 LAB — I-STAT BETA HCG BLOOD, ED (MC, WL, AP ONLY): I-stat hCG, quantitative: 5.3 m[IU]/mL — ABNORMAL HIGH (ref <5)

## 2017-07-11 LAB — COMPREHENSIVE METABOLIC PANEL
ALT: 18 U/L (ref 14–54)
ANION GAP: 17 — AB (ref 5–15)
AST: 46 U/L — ABNORMAL HIGH (ref 15–41)
Albumin: 4.1 g/dL (ref 3.5–5.0)
Alkaline Phosphatase: 90 U/L (ref 38–126)
BUN: 32 mg/dL — ABNORMAL HIGH (ref 6–20)
CHLORIDE: 97 mmol/L — AB (ref 101–111)
CO2: 27 mmol/L (ref 22–32)
CREATININE: 6.99 mg/dL — AB (ref 0.44–1.00)
Calcium: 9.6 mg/dL (ref 8.9–10.3)
GFR, EST AFRICAN AMERICAN: 7 mL/min — AB (ref >60)
GFR, EST NON AFRICAN AMERICAN: 6 mL/min — AB (ref >60)
Glucose, Bld: 101 mg/dL — ABNORMAL HIGH (ref 65–99)
Potassium: 5.1 mmol/L (ref 3.5–5.1)
Sodium: 141 mmol/L (ref 135–145)
Total Bilirubin: 0.4 mg/dL (ref 0.3–1.2)
Total Protein: 7.6 g/dL (ref 6.5–8.1)

## 2017-07-11 LAB — CBC
HCT: 35.4 % — ABNORMAL LOW (ref 36.0–46.0)
Hemoglobin: 10.7 g/dL — ABNORMAL LOW (ref 12.0–15.0)
MCH: 29.1 pg (ref 26.0–34.0)
MCHC: 30.2 g/dL (ref 30.0–36.0)
MCV: 96.2 fL (ref 78.0–100.0)
PLATELETS: 295 10*3/uL (ref 150–400)
RBC: 3.68 MIL/uL — AB (ref 3.87–5.11)
RDW: 14.9 % (ref 11.5–15.5)
WBC: 6.9 10*3/uL (ref 4.0–10.5)

## 2017-07-11 LAB — SALICYLATE LEVEL: Salicylate Lvl: <7 mg/dL (ref 2.8–30.0)

## 2017-07-11 LAB — ETHANOL

## 2017-07-11 LAB — I-STAT CG4 LACTIC ACID, ED: Lactic Acid, Venous: 1.4 mmol/L (ref 0.5–1.9)

## 2017-07-11 LAB — LIPASE, BLOOD: LIPASE: 74 U/L — AB (ref 11–51)

## 2017-07-11 LAB — ACETAMINOPHEN LEVEL: Acetaminophen (Tylenol), Serum: <10 ug/mL — ABNORMAL LOW (ref 10–30)

## 2017-07-11 MED ORDER — FAMOTIDINE 20 MG PO TABS
10.0000 mg | ORAL_TABLET | Freq: Every day | ORAL | Status: DC
  Administered 2017-07-11 – 2017-07-13 (×3): 10 mg via ORAL
  Filled 2017-07-11 (×3): qty 1

## 2017-07-11 MED ORDER — HYDRALAZINE HCL 20 MG/ML IJ SOLN
10.0000 mg | Freq: Once | INTRAMUSCULAR | Status: AC
  Administered 2017-07-11: 10 mg via INTRAVENOUS
  Filled 2017-07-11: qty 1

## 2017-07-11 MED ORDER — HYDROXYCHLOROQUINE SULFATE 200 MG PO TABS
200.0000 mg | ORAL_TABLET | Freq: Every day | ORAL | Status: DC
  Administered 2017-07-11 – 2017-07-13 (×3): 200 mg via ORAL
  Filled 2017-07-11 (×3): qty 1

## 2017-07-11 MED ORDER — CALCITRIOL 0.5 MCG PO CAPS
2.0000 ug | ORAL_CAPSULE | ORAL | Status: DC
  Administered 2017-07-11: 2 ug via ORAL
  Filled 2017-07-11: qty 4

## 2017-07-11 MED ORDER — SODIUM CHLORIDE 0.9% FLUSH: 3.0000 mL | INTRAVENOUS | Status: DC | PRN

## 2017-07-11 MED ORDER — SODIUM CHLORIDE 0.9 % IV SOLN: 250.0000 mL | INTRAVENOUS | Status: DC | PRN

## 2017-07-11 MED ORDER — HYDRALAZINE HCL 25 MG PO TABS
25.0000 mg | ORAL_TABLET | Freq: Three times a day (TID) | ORAL | Status: DC
  Administered 2017-07-11 – 2017-07-13 (×7): 25 mg via ORAL
  Filled 2017-07-11 (×7): qty 1

## 2017-07-11 MED ORDER — SODIUM CHLORIDE 0.9% FLUSH
3.0000 mL | Freq: Two times a day (BID) | INTRAVENOUS | Status: DC
  Administered 2017-07-11 – 2017-07-13 (×5): 3 mL via INTRAVENOUS

## 2017-07-11 MED ORDER — FERROUS GLUCONATE 324 (38 FE) MG PO TABS
324.0000 mg | ORAL_TABLET | Freq: Every day | ORAL | Status: DC
  Administered 2017-07-11: 324 mg via ORAL
  Filled 2017-07-11: qty 1

## 2017-07-11 MED ORDER — SODIUM BICARBONATE 650 MG PO TABS
650.0000 mg | ORAL_TABLET | Freq: Every day | ORAL | Status: DC
  Administered 2017-07-11 – 2017-07-13 (×3): 650 mg via ORAL
  Filled 2017-07-11 (×3): qty 1

## 2017-07-11 MED ORDER — FELODIPINE ER 10 MG PO TB24
10.0000 mg | ORAL_TABLET | Freq: Every day | ORAL | Status: DC
  Administered 2017-07-11 – 2017-07-13 (×3): 10 mg via ORAL
  Filled 2017-07-11 (×3): qty 1

## 2017-07-11 MED ORDER — ONDANSETRON HCL 4 MG/2ML IJ SOLN
4.0000 mg | Freq: Four times a day (QID) | INTRAMUSCULAR | Status: DC | PRN
  Administered 2017-07-11: 4 mg via INTRAVENOUS
  Filled 2017-07-11: qty 2

## 2017-07-11 MED ORDER — ALBUTEROL SULFATE (2.5 MG/3ML) 0.083% IN NEBU
2.5000 mg | INHALATION_SOLUTION | Freq: Four times a day (QID) | RESPIRATORY_TRACT | Status: DC
  Administered 2017-07-11 (×2): 2.5 mg via RESPIRATORY_TRACT
  Filled 2017-07-11 (×2): qty 3

## 2017-07-11 MED ORDER — MYCOPHENOLATE MOFETIL 250 MG PO CAPS
1000.0000 mg | ORAL_CAPSULE | Freq: Two times a day (BID) | ORAL | Status: DC
  Administered 2017-07-11 – 2017-07-13 (×5): 1000 mg via ORAL
  Filled 2017-07-11 (×7): qty 4

## 2017-07-11 MED ORDER — LABETALOL HCL 5 MG/ML IV SOLN: 20.0000 mg | Freq: Once | INTRAVENOUS | Status: DC

## 2017-07-11 MED ORDER — SEVELAMER CARBONATE 800 MG PO TABS
800.0000 mg | ORAL_TABLET | Freq: Three times a day (TID) | ORAL | Status: DC
  Administered 2017-07-11 – 2017-07-13 (×6): 800 mg via ORAL
  Filled 2017-07-11 (×6): qty 1

## 2017-07-11 MED ORDER — HEPARIN SODIUM (PORCINE) 5000 UNIT/ML IJ SOLN
5000.0000 [IU] | Freq: Three times a day (TID) | INTRAMUSCULAR | Status: DC
  Administered 2017-07-11 – 2017-07-13 (×7): 5000 [IU] via SUBCUTANEOUS
  Filled 2017-07-11 (×7): qty 1

## 2017-07-11 MED ORDER — LABETALOL HCL 5 MG/ML IV SOLN
20.0000 mg | Freq: Once | INTRAVENOUS | Status: AC
  Administered 2017-07-11: 20 mg via INTRAVENOUS
  Filled 2017-07-11: qty 4

## 2017-07-11 MED ORDER — CALCITRIOL 0.25 MCG PO CAPS
2.0000 ug | ORAL_CAPSULE | ORAL | Status: DC
  Administered 2017-07-12: 2 ug via ORAL
  Filled 2017-07-11: qty 4
  Filled 2017-07-11: qty 8

## 2017-07-11 MED ORDER — ONDANSETRON HCL 4 MG PO TABS: 4.0000 mg | ORAL_TABLET | Freq: Four times a day (QID) | ORAL | Status: DC | PRN

## 2017-07-11 MED ORDER — ACETAMINOPHEN 650 MG RE SUPP: 650.0000 mg | Freq: Four times a day (QID) | RECTAL | Status: DC | PRN

## 2017-07-11 MED ORDER — SODIUM CHLORIDE 0.9 % IV SOLN
125.0000 mg | INTRAVENOUS | Status: DC
  Administered 2017-07-12: 125 mg via INTRAVENOUS
  Filled 2017-07-11 (×3): qty 10

## 2017-07-11 MED ORDER — CINACALCET HCL 30 MG PO TABS
60.0000 mg | ORAL_TABLET | Freq: Every day | ORAL | Status: DC
  Administered 2017-07-12 – 2017-07-13 (×2): 60 mg via ORAL
  Filled 2017-07-11 (×3): qty 2

## 2017-07-11 MED ORDER — ACETAMINOPHEN 325 MG PO TABS: 650.0000 mg | ORAL_TABLET | Freq: Four times a day (QID) | ORAL | Status: DC | PRN

## 2017-07-11 MED ORDER — ALBUTEROL SULFATE (2.5 MG/3ML) 0.083% IN NEBU
2.5000 mg | INHALATION_SOLUTION | Freq: Two times a day (BID) | RESPIRATORY_TRACT | Status: DC
  Administered 2017-07-12 – 2017-07-13 (×2): 2.5 mg via RESPIRATORY_TRACT
  Filled 2017-07-11 (×3): qty 3

## 2017-07-11 NOTE — Plan of Care (Signed)
  Progressing Education: Knowledge of General Education information will improve 07/11/2017 2232 - Progressing by Verne Grain, RN Activity: Risk for activity intolerance will decrease 07/11/2017 2232 - Progressing by Verne Grain, RN Safety: Ability to remain free from injury will improve 07/11/2017 2232 - Progressing by Verne Grain, RN  Patient admitted to 5w31, alert and oriented to self. Patient cooperative and moves well in bed, skin intact. Patient seems to have developmental delay, frequently smiling and inappropriately answering questions. Unsure if it is due to diagnosis or history. Will continue to monitor.

## 2017-07-11 NOTE — ED Provider Notes (Addendum)
[facility] EMERGENCY DEPARTMENT Provider Note   CSN: [id number] Arrival date & time: 07/11/17  6578     History   Chief Complaint Chief Complaint  Patient presents with  . Altered Mental Status    HPI Kathleen [name] is a 57 y.o. female.  Level 5 caveat for altered mental status.  Patient brought in by EMS with confusion and not acting right for the past 2 days.  Last seen normal was apparently on March 2 prior to dialysis.  Patient recently started on dialysis 2 weeks ago.  EMS reports patient was vomiting.  Her significant other called EMS tonight only because he was not able to get her off the floor.  Patient is confused and slow to respond.  She does answer some questions appropriately.  Chart review shows history of COPD, lupus, ESRD and previous cardiac arrest.   The history is provided by the patient and the EMS personnel. The history is limited by the condition of the patient.  Altered Mental Status      Past Medical History:  Diagnosis Date  . Acute renal failure (ARF) ([place])   . Anemia   . Arthritis   . Cardiac arrest ([place])   . Cellulitis   . Cerebral vasculitis   . COPD (chronic obstructive pulmonary disease) ([place])   . Elevated antinuclear antibody (ANA) level   . Endometriosis   . Gallstones   . Hypertension   . Lupus   . Migraine   . PONV (postoperative nausea and vomiting)   . Seizures ([place])   . Sepsis ([place])   . Suicide attempt ([place])   . TB (tuberculosis)   . Transient ischemic attack (TIA)   . Vasculitis of skin     Patient Active Problem List   Diagnosis Date Noted  . Benign essential HTN 07/01/2017  . Stage 5 chronic kidney disease not on chronic dialysis ([place])   . Hypertensive crisis 06/29/2017  . Hypertensive emergency   . Lupus erythematosus   . Malnutrition of moderate degree 07/14/2016  . Lupus 06/28/2016  . Hypertension 06/28/2016  . Kidney disease 06/28/2016  . Symptomatic anemia 06/28/2016  . Abdominal pain  06/28/2016  . Current non-adherence to medical treatment 06/28/2016  . Hypertensive urgency 06/28/2016  . SLE exacerbation ([place]) 06/28/2016  . Renal insufficiency     Past Surgical History:  Procedure Laterality Date  . AV FISTULA PLACEMENT Left 07/04/2017   Procedure: ARTERIOVENOUS (AV) FISTULA CREATION;  Surgeon: Conrad Elsmere, MD;  Location: [facility];  Service: Vascular;  Laterality: Left;  . CESAREAN SECTION    . ESOPHAGOGASTRODUODENOSCOPY N/A 07/15/2016   Procedure: ESOPHAGOGASTRODUODENOSCOPY (EGD);  Surgeon: Laurence Spates, MD;  Location: [facility] ENDOSCOPY;  Service: Endoscopy;  Laterality: N/A;  . INSERTION OF DIALYSIS CATHETER Right 07/04/2017   Procedure: INSERTION OF TUNNELED  DIALYSIS CATHETER - RIGHT INTERNAL JUGULAR PLACEMENT;  Surgeon: Conrad Lingle, MD;  Location: [facility];  Service: Vascular;  Laterality: Right;  . TUBAL LIGATION      OB History    No data available       Home Medications    Prior to Admission medications   Medication Sig Start Date End Date Taking? Authorizing Provider  calcitRIOL (ROCALTROL) 0.5 MCG capsule Take 4 capsules (2 mcg total) by mouth every other day. 07/08/17   Georgette Shell, MD  cinacalcet (SENSIPAR) 30 MG tablet Take 2 tablets (60 mg total) by mouth daily with supper. 07/07/17   Georgette Shell, MD  felodipine (  PLENDIL) 5 MG 24 hr tablet Take 2 tablets (10 mg total) by mouth daily. 06/29/16   Patrecia Pour, MD  ferrous gluconate (FERGON) 324 MG tablet Take 324 mg by mouth daily. 04/14/17   [provider]  gabapentin (NEURONTIN) 300 MG capsule Take 300 mg by mouth daily. 04/19/17   [provider]  hydrALAZINE (APRESOLINE) 25 MG tablet Take 1 tablet (25 mg total) by mouth every 8 (eight) hours. 07/16/16   Patrecia Pour, MD  hydroxychloroquine (PLAQUENIL) 200 MG tablet Take 1.5 tablets (300 mg total) by mouth daily. Patient taking differently: Take 200 mg by mouth daily.  06/29/16   Patrecia Pour, MD  lidocaine-prilocaine  (EMLA) cream Apply 1 application topically as needed (topical anesthesia for hemodialysis if Gebauers and Lidocaine injection are ineffective.). 07/07/17   Georgette Shell, MD  multivitamin (RENA-VIT) TABS tablet Take 1 tablet by mouth at bedtime. 07/07/17   Georgette Shell, MD  mycophenolate (CELLCEPT) 500 MG tablet Take 2 tablets (1,000 mg total) by mouth 2 (two) times daily. 06/29/16   Patrecia Pour, MD  ranitidine (ZANTAC) 150 MG tablet Take 150 mg by mouth daily. 06/19/17   [provider]  sevelamer carbonate (RENVELA) 800 MG tablet Take 800 mg by mouth 3 (three) times daily with meals. 04/14/17   [provider]  sodium bicarbonate 650 MG tablet Take 1 tablet (650 mg total) by mouth daily. 06/29/16   Patrecia Pour, MD    Family History No family history on file.  Social History Social History   Tobacco Use  . Smoking status: Current Some Day Smoker    Packs/day: 0.50    Types: Cigarettes  . Smokeless tobacco: Never Used  Substance Use Topics  . Alcohol use: No  . Drug use: No     Allergies   Patient has no known allergies.   Review of Systems Review of Systems  Unable to perform ROS: Mental status change     Physical Exam Updated Vital Signs BP (!) 181/144   Pulse (!) 111   Temp (!) 96.8 F (36 C) (Rectal)   Resp (!) 22   Ht 5\' 4"  (1.626 m)   SpO2 100%   BMI 19.38 kg/m   Physical Exam  Constitutional: No distress.  Patient is resting with eyes closed, somewhat slow to respond.  With verbal stimulation she will awake and answer questions appropriately.  She denies any pain  HENT:  Head: Normocephalic and atraumatic.  Right Ear: External ear normal.  Left Ear: External ear normal.  Mouth/Throat: No oropharyngeal exudate.  Eyes: Conjunctivae and EOM are normal. Pupils are equal, round, and reactive to light.  Neck: Normal range of motion. Neck supple.  Cardiovascular: Normal rate, regular rhythm and normal heart sounds.  No murmur  heard. Pulmonary/Chest: No respiratory distress. She has no wheezes.  Dialysis catheter right chest appears clean  Abdominal: Soft. She exhibits no mass. There is no tenderness. There is no rebound.  Musculoskeletal: Normal range of motion. She exhibits no edema, tenderness or deformity.  Neurological: She is alert.  Equal grip strength bilaterally, 4/5 strength throughout, no asterixis, tongue is midline  Skin: Skin is warm. Capillary refill takes less than 2 seconds. No rash noted. No erythema.     ED Treatments / Results  Labs (all labs ordered are listed, but only abnormal results are displayed) Labs Reviewed  COMPREHENSIVE METABOLIC PANEL - Abnormal; Notable for the following components:      Result  Value   Chloride 97 (*)    Glucose, Bld 101 (*)    BUN 32 (*)    Creatinine, Ser 6.99 (*)    AST 46 (*)    GFR calc non Af Amer 6 (*)    GFR calc Af Amer 7 (*)    Anion gap 17 (*)    All other components within normal limits  CBC - Abnormal; Notable for the following components:   RBC 3.68 (*)    Hemoglobin 10.7 (*)    HCT 35.4 (*)    All other components within normal limits  ACETAMINOPHEN LEVEL - Abnormal; Notable for the following components:   Acetaminophen (Tylenol), Serum <10 (*)    All other components within normal limits  LIPASE, BLOOD - Abnormal; Notable for the following components:   Lipase 74 (*)    All other components within normal limits  I-STAT BETA HCG BLOOD, ED (MC, WL, AP ONLY) - Abnormal; Notable for the following components:   I-stat hCG, quantitative 5.3 (*)    All other components within normal limits  CULTURE, BLOOD (ROUTINE X 2)  CULTURE, BLOOD (ROUTINE X 2)  ETHANOL  SALICYLATE LEVEL  RAPID URINE DRUG SCREEN, HOSP PERFORMED  CBG MONITORING, ED  I-STAT CG4 LACTIC ACID, ED  I-STAT CHEM 8, ED  I-STAT CG4 LACTIC ACID, ED    EKG  EKG Interpretation  Date/Time:  Monday July 11 2017 05:20:54 EST Ventricular Rate:  89 PR Interval:    QRS  Duration: 93 QT Interval:  388 QTC Calculation: 473 R Axis:   91 Text Interpretation:  Sinus rhythm LAE, consider biatrial enlargement Borderline right axis deviation Abnormal R-wave progression, late transition Probable left ventricular hypertrophy Nonspecific T abnrm, anterolateral leads septal T wave inversions Confirmed by Ezequiel Essex [phone]) on 07/11/2017 6:08:58 AM       Radiology Ct Head Wo Contrast  Result Date: 07/11/2017 CLINICAL DATA:  Altered level of consciousness (LOC), unexplained EXAM: CT HEAD WITHOUT CONTRAST TECHNIQUE: Contiguous axial images were obtained from the base of the skull through the vertex without intravenous contrast. COMPARISON:  None. FINDINGS: Brain: No mass lesion, intraparenchymal hemorrhage or extra-axial collection. No evidence of acute cortical infarct. Periventricular hypoattenuation suggesting chronic microvascular disease. Vascular: No hyperdense vessel or unexpected vascular calcification. Skull: Normal visualized skull base, calvarium and extracranial soft tissues. Sinuses/Orbits: No sinus fluid levels or advanced mucosal thickening. No mastoid effusion. Normal orbits. IMPRESSION: Chronic small vessel ischemia without acute intracranial abnormality. Electronically Signed   By: Ulyses Jarred M.D.   On: 07/11/2017 06:24   Dg Chest Portable 1 View  Result Date: 07/11/2017 CLINICAL DATA:  Altered mental status. EXAM: PORTABLE CHEST 1 VIEW COMPARISON:  07/04/2017 FINDINGS: R IJ HD catheter is in unchanged position. Unchanged cardiomediastinal contours no focal airspace disease. No pleural effusion or pneumothorax IMPRESSION: No active disease. Electronically Signed   By: Ulyses Jarred M.D.   On: 07/11/2017 06:28    Procedures Procedures (including critical care time)  Medications Ordered in ED Medications - No data to display   Initial Impression / Assessment and Plan / ED Course  I have reviewed the triage vital signs and the nursing  notes.  Pertinent labs & imaging results that were available during my care of the patient were reviewed by me and considered in my medical decision making (see chart for details).    Patient with altered mental status.  Was recently started on dialysis last week.  She is hypertensive.  She is somnolent  but arouses and follows commands appropriately.  Denies pain.  CT head is negative.  Labs are reassuring with normal potassium Chest x-ray is negative.  No evidence of infection and she is afebrile.  Suspect her altered mental status may be due to her uncontrolled hypertension.  She is given IV hydralazine and labetalol. She denies any headache or chest pain.  Admission discussed with NP Black of hospitalist service.  Nephrology will be made aware of admission.  CRITICAL CARE Performed by: Ezequiel Essex Total critical care time: 33 minutes Critical care time was exclusive of separately billable procedures and treating other patients. Critical care was necessary to treat or prevent imminent or life-threatening deterioration. Critical care was time spent personally by me on the following activities: development of treatment plan with patient and/or surrogate as well as nursing, discussions with consultants, evaluation of patient's response to treatment, examination of patient, obtaining history from patient or surrogate, ordering and performing treatments and interventions, ordering and review of laboratory studies, ordering and review of radiographic studies, pulse oximetry and re-evaluation of patient's condition.   Final Clinical Impressions(s) / ED Diagnoses   Final diagnoses:  Altered mental status, unspecified altered mental status type  Hypertensive urgency    ED Discharge Orders    None       Nat Lowenthal, Annie Main, MD 07/11/17 0802    Ezequiel Essex, MD 07/11/17 [phone]

## 2017-07-11 NOTE — ED Notes (Signed)
While at bedside beginning to pass patient medications, patient began actively vomiting after a sip of water. Will give prn zofran and attempt.

## 2017-07-11 NOTE — Consult Note (Addendum)
[facility] Renal Consultation Note  Indication for Consultation:  Management of ESRD/hemodialysis; anemia, hypertension/volume and secondary hyperparathyroidism  HPI: Kathleen [name] is a 57 y.o. female with ESRD ( SLE/HTN )  Recent admit htn crisis /   Pul edema    2/201/9- 07/07/17 dc with [facility] ) last HD  Sat 3/02 no reported op problems reported now admitted with AMS/htn crisis  , info from chart ( no family  available and pt poor historian) "Family reported  she is more lethargic,  one episode of emesis, and Yesterday called EMS as she  was lying on the floor and would not get up.No  Reports of  Fever, chills diarrhea., chest pain , abd pain or cough . And was unable to take home meds sec Too sleepy " Noted HO  SLE with CNS involvement and HTN Crisis 1 year ago and recent admit 2/201/19  Admits.    Presented to ER BP 181/144. Room air 100% O2 sat ,  Bp Currently improved after Labetalol and Hydralazine =  120/91 . She is now pleasanlty confused "[facility]. , Wednesday"  and does not remember going to [facility] center for HD past Saturday.  Not aware she is on HD at all.Very poor historian with cognitive impairment.   CXR = NAD  CT HD = .CSVD  k 5.1 bun 32 Scr 7   Past Medical History:  Diagnosis Date  . Anemia   . Arthritis   . Cardiac arrest ([place])   . Cellulitis   . Cerebral vasculitis   . COPD (chronic obstructive pulmonary disease) ([place])   . Elevated antinuclear antibody (ANA) level   . Endometriosis   . ESRD (end stage renal disease) on dialysis ([place])   . Gallstones   . Hypertension   . Lupus   . Migraine   . PONV (postoperative nausea and vomiting)   . Seizures ([place])   . Sepsis ([place])   . Suicide attempt ([place])   . TB (tuberculosis)   . Transient ischemic attack (TIA)   . Vasculitis of skin     Past Surgical History:  Procedure Laterality Date  . AV FISTULA PLACEMENT Left 07/04/2017   Procedure: ARTERIOVENOUS (AV) FISTULA  CREATION;  Surgeon: Conrad Causey, MD;  Location: [facility];  Service: Vascular;  Laterality: Left;  . CESAREAN SECTION    . ESOPHAGOGASTRODUODENOSCOPY N/A 07/15/2016   Procedure: ESOPHAGOGASTRODUODENOSCOPY (EGD);  Surgeon: Laurence Spates, MD;  Location: [facility] ENDOSCOPY;  Service: Endoscopy;  Laterality: N/A;  . INSERTION OF DIALYSIS CATHETER Right 07/04/2017   Procedure: INSERTION OF TUNNELED  DIALYSIS CATHETER - RIGHT INTERNAL JUGULAR PLACEMENT;  Surgeon: Conrad [place], MD;  Location: [facility];  Service: Vascular;  Laterality: Right;  . TUBAL LIGATION       No family history on file.    reports that she has been smoking cigarettes.  She has been smoking about 0.50 packs per day. she has never used smokeless tobacco. She reports that she does not drink alcohol or use drugs.  No Known Allergies  Prior to Admission medications   Medication Sig Start Date End Date Taking? Authorizing Provider  calcitRIOL (ROCALTROL) 0.5 MCG capsule Take 4 capsules (2 mcg total) by mouth every other day. 07/08/17   Georgette Shell, MD  cinacalcet (SENSIPAR) 30 MG tablet Take 2 tablets (60 mg total) by mouth daily with supper. 07/07/17   Georgette Shell, MD  felodipine (PLENDIL) 5 MG 24 hr  tablet Take 2 tablets (10 mg total) by mouth daily. 06/29/16   Patrecia Pour, MD  ferrous gluconate (FERGON) 324 MG tablet Take 324 mg by mouth daily. 04/14/17   [provider]  gabapentin (NEURONTIN) 300 MG capsule Take 300 mg by mouth daily. 04/19/17   [provider]  hydrALAZINE (APRESOLINE) 25 MG tablet Take 1 tablet (25 mg total) by mouth every 8 (eight) hours. 07/16/16   Patrecia Pour, MD  hydroxychloroquine (PLAQUENIL) 200 MG tablet Take 1.5 tablets (300 mg total) by mouth daily. Patient taking differently: Take 200 mg by mouth daily.  06/29/16   Patrecia Pour, MD  lidocaine-prilocaine (EMLA) cream Apply 1 application topically as needed (topical anesthesia for hemodialysis if Gebauers and Lidocaine injection are  ineffective.). 07/07/17   Georgette Shell, MD  multivitamin (RENA-VIT) TABS tablet Take 1 tablet by mouth at bedtime. 07/07/17   Georgette Shell, MD  mycophenolate (CELLCEPT) 500 MG tablet Take 2 tablets (1,000 mg total) by mouth 2 (two) times daily. 06/29/16   Patrecia Pour, MD  ranitidine (ZANTAC) 150 MG tablet Take 150 mg by mouth daily. 06/19/17   [provider]  sevelamer carbonate (RENVELA) 800 MG tablet Take 800 mg by mouth 3 (three) times daily with meals. 04/14/17   [provider]  sodium bicarbonate 650 MG tablet Take 1 tablet (650 mg total) by mouth daily. 06/29/16   Patrecia Pour, MD    LHT:DSKAJG chloride, acetaminophen **OR** acetaminophen, ondansetron **OR** ondansetron (ZOFRAN) IV, sodium chloride flush  Results for orders placed or performed during the hospital encounter of 07/11/17 (from the past 48 hour(s))  Comprehensive metabolic panel     Status: Abnormal   Collection Time: 07/11/17  5:26 AM  Result Value Ref Range   Sodium 141 135 - 145 mmol/L   Potassium 5.1 3.5 - 5.1 mmol/L   Chloride 97 (L) 101 - 111 mmol/L   CO2 27 22 - 32 mmol/L   Glucose, Bld 101 (H) 65 - 99 mg/dL   BUN 32 (H) 6 - 20 mg/dL   Creatinine, Ser 6.99 (H) 0.44 - 1.00 mg/dL   Calcium 9.6 8.9 - 10.3 mg/dL   Total Protein 7.6 6.5 - 8.1 g/dL   Albumin 4.1 3.5 - 5.0 g/dL   AST 46 (H) 15 - 41 U/L   ALT 18 14 - 54 U/L   Alkaline Phosphatase 90 38 - 126 U/L   Total Bilirubin 0.4 0.3 - 1.2 mg/dL   GFR calc non Af Amer 6 (L) >60 mL/min   GFR calc Af Amer 7 (L) >60 mL/min    Comment: (NOTE) The eGFR has been calculated using the CKD EPI equation. This calculation has not been validated in all clinical situations. eGFR's persistently <60 mL/min signify possible Chronic Kidney Disease.    Anion gap 17 (H) 5 - 15    Comment: Performed at [facility], [place] [address], [place], [place] [address]  CBC     Status: Abnormal   Collection Time: 07/11/17  5:26 AM  Result Value Ref  Range   WBC 6.9 4.0 - 10.5 K/uL   RBC 3.68 (L) 3.87 - 5.11 MIL/uL   Hemoglobin 10.7 (L) 12.0 - 15.0 g/dL   HCT 35.4 (L) 36.0 - 46.0 %   MCV 96.2 78.0 - 100.0 fL   MCH 29.1 26.0 - 34.0 pg   MCHC 30.2 30.0 - 36.0 g/dL   RDW 14.9 11.5 - 15.5 %  Platelets 295 150 - 400 K/uL    Comment: Performed at [facility], [place] [address] [place], [place] [address]  Lipase, blood     Status: Abnormal   Collection Time: 07/11/17  5:26 AM  Result Value Ref Range   Lipase 74 (H) 11 - 51 U/L    Comment: Performed at [facility] [address], [place], [place] [address]  I-Stat beta hCG blood, ED     Status: Abnormal   Collection Time: 07/11/17  5:42 AM  Result Value Ref Range   I-stat hCG, quantitative 5.3 (H) <5 mIU/mL   Comment 3            Comment:   GEST. AGE      CONC.  (mIU/mL)   <=1 WEEK        5 - 50     2 WEEKS       50 - 500     3 WEEKS       100 - 10,000     4 WEEKS     1,000 - 30,000        FEMALE AND NON-PREGNANT FEMALE:     LESS THAN 5 mIU/mL   Ethanol     Status: None   Collection Time: 07/11/17  5:42 AM  Result Value Ref Range   Alcohol, Ethyl (B) <10 <10 mg/dL    Comment:        LOWEST DETECTABLE LIMIT FOR SERUM ALCOHOL IS 10 mg/dL FOR MEDICAL PURPOSES ONLY Performed at [facility], [place] [address] [place], [place] [address]   Acetaminophen level     Status: Abnormal   Collection Time: 07/11/17  5:42 AM  Result Value Ref Range   Acetaminophen (Tylenol), Serum <10 (L) 10 - 30 ug/mL    Comment:        THERAPEUTIC CONCENTRATIONS VARY SIGNIFICANTLY. A RANGE OF 10-30 ug/mL MAY BE AN EFFECTIVE CONCENTRATION FOR MANY PATIENTS. HOWEVER, SOME ARE BEST TREATED AT CONCENTRATIONS OUTSIDE THIS RANGE. ACETAMINOPHEN CONCENTRATIONS >150 ug/mL AT 4 HOURS AFTER INGESTION AND >50 ug/mL AT 12 HOURS AFTER INGESTION ARE OFTEN ASSOCIATED WITH TOXIC REACTIONS. Performed at [facility], [place] [address], [place], [place] [address]   Salicylate level      Status: None   Collection Time: 07/11/17  5:42 AM  Result Value Ref Range   Salicylate Lvl <3.9 2.8 - 30.0 mg/dL    Comment: Performed at [facility] [address], [place], [place] [address]  I-Stat CG4 Lactic Acid, ED     Status: None   Collection Time: 07/11/17  5:45 AM  Result Value Ref Range   Lactic Acid, Venous 1.40 0.5 - 1.9 mmol/L  CBG monitoring, ED     Status: None   Collection Time: 07/11/17  5:47 AM  Result Value Ref Range   Glucose-Capillary 85 65 - 99 mg/dL    ROS: see hpi Physical Exam: Vitals:   07/11/17 1515 07/11/17 1615  BP: (!) 120/91 (!) 112/98  Pulse: 86 91  Resp: 14 15  Temp:    SpO2: 100% 100%     General: Cachetic ythin , AAF  NAD , Slightly lethargic awakens to voice minimal interaction  HEENT: [place] , MMM , Nonicteric  Neck: supple, no jvd  Heart: RRR soft 1/6sem lsb , no rub or gallop Lungs: CTA ,unlabored breathing  Abdomen: bs pos ,soft ,nT,nD  Extremities: no pedlal edema Skin: no acute rash  Neuro:  pleasantly disoriented x4, does not follow command , moves extremities independently  Dialysis Access: pos bruit LUA AVF/ R  ij p. Cath   Dialysis Orders: Center: [facility]   on TTS . EDW 50kg HD Bath 2k, 2.25ca  Time 4hr Heparin 1500. Access R IJ p. Cath  LUA AVF ( 2/ 25/19  inserted)     Mircera 200 q2wks (due 07/12/17) venofer 139m q hd x2 Calcitriol 2.046m q hd   Assessment/Plan 1. Htn  Crisis = missed meds , meds per admit team  , hd in am  No excess vol today /no acute hd needs  2. AMS= multifactorial = HTN crisis and ?baseline  Cognitive deficits / ho SLE cns  3. ESRD -  Hd tts 4. Anemia  - hgb 10.7 hold esa in am hd (due for 3/05 0 guve thurs if bp stable /fe on hd 2 more txs  For load  5. Metabolic bone disease -  Po vit  d on hd , binder, sensipar  6. SLE-  DaErnest HaberPA-C [phone]/08/2017, 4:46 PM   Pt seen, examined and agree w A/P as above.  RoKelly SplinterD CaCrown Holdingsager [phone] 07/12/2017, 8:33 AM

## 2017-07-11 NOTE — ED Notes (Signed)
Report attempted 

## 2017-07-11 NOTE — H&P (Signed)
History and Physical    Kathleen [name] OAC:[id number] DOB: [birth year] DOA: 07/11/2017  PCP: System, Pcp Not In Patient coming from: home  Chief Complaint: acute encephalopathy  HPI: Kathleen [name] is a 57 y.o. female with medical history significant for lupus, hypertension, advanced renal disease recently started on dialysis, TIAs, COPD not on home oxygen presents to emergency Department chief complaint of altered mental status. Initial evaluation reveals uncontrolled blood pressure and lethargy. [facility] are asked to admit  Information is obtained from the chart and the patient noting that information from the patient may be unreliable due to altered mental status. Chart review indicates she was brought in by EMS with 2 day history of confusion and "not acting right". She attended dialysis 2 days ago completed the session without incident. Family reports she is more lethargic had one episode of emesis denied coffee-ground emesis. Yesterday they called EMS he was lying on the floor and would not get up. Reports of any fever chills diarrhea. No reports of any cough complaints of pain. She's been unable to take her home medications as she was too "sleepy". She denies any headache dizziness chest pain palpitations. She denies abdominal pain.  Of note approximately 3 weeks ago she was admitted by [facility] a.m. for hypertensive crisis and dyspnea. On that admission she required a glycerin drip in addition to her usual labetalol   ED Course: In the emergency department her blood pressure is uncontrolled she is provided with labetalol and hydralazine. At the time of admission she is more alert her blood pressure is controlled.  Review of Systems: As per HPI otherwise all other systems reviewed and are negative.   Ambulatory Status: Ambulates with a cane no recent falls  Past Medical History:  Diagnosis Date  . Acute renal failure (ARF) ([place])   . Anemia   . Arthritis   . Cardiac arrest ([place])     . Cellulitis   . Cerebral vasculitis   . COPD (chronic obstructive pulmonary disease) ([place])   . Elevated antinuclear antibody (ANA) level   . Endometriosis   . Gallstones   . Hypertension   . Lupus   . Migraine   . PONV (postoperative nausea and vomiting)   . Seizures ([place])   . Sepsis ([place])   . Suicide attempt ([place])   . TB (tuberculosis)   . Transient ischemic attack (TIA)   . Vasculitis of skin     Past Surgical History:  Procedure Laterality Date  . AV FISTULA PLACEMENT Left 07/04/2017   Procedure: ARTERIOVENOUS (AV) FISTULA CREATION;  Surgeon: Conrad Wiley Ford, MD;  Location: [facility];  Service: Vascular;  Laterality: Left;  . CESAREAN SECTION    . ESOPHAGOGASTRODUODENOSCOPY N/A 07/15/2016   Procedure: ESOPHAGOGASTRODUODENOSCOPY (EGD);  Surgeon: Laurence Spates, MD;  Location: [facility] ENDOSCOPY;  Service: Endoscopy;  Laterality: N/A;  . INSERTION OF DIALYSIS CATHETER Right 07/04/2017   Procedure: INSERTION OF TUNNELED  DIALYSIS CATHETER - RIGHT INTERNAL JUGULAR PLACEMENT;  Surgeon: Conrad Phoenix Lake, MD;  Location: [place];  Service: Vascular;  Laterality: Right;  . TUBAL LIGATION      Social History   Socioeconomic History  . Marital status: Single    Spouse name: Not on file  . Number of children: Not on file  . Years of education: Not on file  . Highest education level: Not on file  Social Needs  . Financial resource strain: Not on file  . Food insecurity - worry: Not on file  . Food insecurity -  inability: Not on file  . Transportation needs - medical: Not on file  . Transportation needs - non-medical: Not on file  Occupational History  . Not on file  Tobacco Use  . Smoking status: Current Some Day Smoker    Packs/day: 0.50    Types: Cigarettes  . Smokeless tobacco: Never Used  Substance and Sexual Activity  . Alcohol use: No  . Drug use: No  . Sexual activity: Not Currently  Other Topics Concern  . Not on file  Social History Narrative  . Not on file    No Known  Allergies  No family history on file.  Prior to Admission medications   Medication Sig Start Date End Date Taking? Authorizing Provider  calcitRIOL (ROCALTROL) 0.5 MCG capsule Take 4 capsules (2 mcg total) by mouth every other day. 07/08/17   Georgette Shell, MD  cinacalcet (SENSIPAR) 30 MG tablet Take 2 tablets (60 mg total) by mouth daily with supper. 07/07/17   Georgette Shell, MD  felodipine (PLENDIL) 5 MG 24 hr tablet Take 2 tablets (10 mg total) by mouth daily. 06/29/16   Patrecia Pour, MD  ferrous gluconate (FERGON) 324 MG tablet Take 324 mg by mouth daily. 04/14/17   [provider]  gabapentin (NEURONTIN) 300 MG capsule Take 300 mg by mouth daily. 04/19/17   [provider]  hydrALAZINE (APRESOLINE) 25 MG tablet Take 1 tablet (25 mg total) by mouth every 8 (eight) hours. 07/16/16   Patrecia Pour, MD  hydroxychloroquine (PLAQUENIL) 200 MG tablet Take 1.5 tablets (300 mg total) by mouth daily. Patient taking differently: Take 200 mg by mouth daily.  06/29/16   Patrecia Pour, MD  lidocaine-prilocaine (EMLA) cream Apply 1 application topically as needed (topical anesthesia for hemodialysis if Gebauers and Lidocaine injection are ineffective.). 07/07/17   Georgette Shell, MD  multivitamin (RENA-VIT) TABS tablet Take 1 tablet by mouth at bedtime. 07/07/17   Georgette Shell, MD  mycophenolate (CELLCEPT) 500 MG tablet Take 2 tablets (1,000 mg total) by mouth 2 (two) times daily. 06/29/16   Patrecia Pour, MD  ranitidine (ZANTAC) 150 MG tablet Take 150 mg by mouth daily. 06/19/17   [provider]  sevelamer carbonate (RENVELA) 800 MG tablet Take 800 mg by mouth 3 (three) times daily with meals. 04/14/17   [provider]  sodium bicarbonate 650 MG tablet Take 1 tablet (650 mg total) by mouth daily. 06/29/16   Patrecia Pour, MD    Physical Exam: Vitals:   07/11/17 8841 07/11/17 0630 07/11/17 0758 07/11/17 0826  BP:  (!) 173/131 108/84   Pulse: 84  78    Resp: 16 (!) 31 14   Temp:   (!) 97.2 F (36.2 C)   TempSrc:   Oral   SpO2: 99%  99%   Weight:    50.8 kg (112 lb)  Height:    5\' 4"  (1.626 m)     General:  Appears lying in bed quite comfortable arouses to verbal stimuli. Eyes:  PERRL, EOMI, normal lids, iris ENT:  grossly normal hearing, lips & tongue, mucous membranes of her mouth are moist and pink Neck:  no LAD, masses or thyromegaly Cardiovascular:  RRR, no m/r/g. No LE edema.  Respiratory:  CTA bilaterally, no w/r/r. Normal respiratory effort. Abdomen:  soft, ntnd, positive bowel sounds throughout no guarding or rebounding Skin:  no rash or induration seen on limited exam Musculoskeletal:  grossly normal tone BUE/BLE, good ROM, no  bony abnormality Psychiatric:  grossly normal mood and affect, speech fluent and appropriate, AOx3 Neurologic:  Somewhat lethargic response to verbal stimuli follows simple commands grip 5 out of 5 on right 4 out of 5 on left moving all extremities  Labs on Admission: I have personally reviewed following labs and imaging studies  CBC: Recent Labs  Lab 07/06/17 0624 07/06/17 1519 07/11/17 0526  WBC 5.0 5.5 6.9  HGB 8.5* 8.2* 10.7*  HCT 27.5* 26.7* 35.4*  MCV 94.8 94.0 96.2  PLT 273 257 774   Basic Metabolic Panel: Recent Labs  Lab 07/06/17 0624 07/06/17 1519 07/11/17 0526  NA 136 132* 141  K 4.9 4.5 5.1  CL 99* 94* 97*  CO2 21* 25 27  GLUCOSE 99 123* 101*  BUN 65* 29* 32*  CREATININE 7.23* 4.36* 6.99*  CALCIUM 8.4* 8.3* 9.6  PHOS 5.9* 4.0  --    GFR: Estimated Creatinine Clearance: 7.2 mL/min (A) (by C-G formula based on SCr of 6.99 mg/dL (H)). Liver Function Tests: Recent Labs  Lab 07/04/17 1653 07/06/17 0624 07/06/17 1519 07/11/17 0526  AST  --   --   --  46*  ALT 17  --   --  18  ALKPHOS  --   --   --  90  BILITOT  --   --   --  0.4  PROT  --   --   --  7.6  ALBUMIN  --  3.6 3.5 4.1   Recent Labs  Lab 07/11/17 0526  LIPASE 74*   No results for input(s):  AMMONIA in the last 168 hours. Coagulation Profile: No results for input(s): INR, PROTIME in the last 168 hours. Cardiac Enzymes: No results for input(s): CKTOTAL, CKMB, CKMBINDEX, TROPONINI in the last 168 hours. BNP (last 3 results) No results for input(s): PROBNP in the last 8760 hours. HbA1C: No results for input(s): HGBA1C in the last 72 hours. CBG: Recent Labs  Lab 07/11/17 0547  GLUCAP 85   Lipid Profile: No results for input(s): CHOL, HDL, LDLCALC, TRIG, CHOLHDL, LDLDIRECT in the last 72 hours. Thyroid Function Tests: No results for input(s): TSH, T4TOTAL, FREET4, T3FREE, THYROIDAB in the last 72 hours. Anemia Panel: No results for input(s): VITAMINB12, FOLATE, FERRITIN, TIBC, IRON, RETICCTPCT in the last 72 hours. Urine analysis:    Component Value Date/Time   COLORURINE STRAW (A) 06/29/2017 1428   APPEARANCEUR CLEAR 06/29/2017 1428   LABSPEC 1.009 06/29/2017 1428   PHURINE 6.0 06/29/2017 1428   GLUCOSEU NEGATIVE 06/29/2017 1428   HGBUR SMALL (A) 06/29/2017 1428   BILIRUBINUR NEGATIVE 06/29/2017 1428   KETONESUR NEGATIVE 06/29/2017 1428   PROTEINUR 100 (A) 06/29/2017 1428   NITRITE NEGATIVE 06/29/2017 1428   LEUKOCYTESUR NEGATIVE 06/29/2017 1428    Creatinine Clearance: Estimated Creatinine Clearance: 7.2 mL/min (A) (by C-G formula based on SCr of 6.99 mg/dL (H)).  Sepsis Labs: @LABRCNTIP (procalcitonin:4,lacticidven:4) )No results found for this or any previous visit (from the past 240 hour(s)).   Radiological Exams on Admission: Ct Head Wo Contrast  Result Date: 07/11/2017 CLINICAL DATA:  Altered level of consciousness (LOC), unexplained EXAM: CT HEAD WITHOUT CONTRAST TECHNIQUE: Contiguous axial images were obtained from the base of the skull through the vertex without intravenous contrast. COMPARISON:  None. FINDINGS: Brain: No mass lesion, intraparenchymal hemorrhage or extra-axial collection. No evidence of acute cortical infarct. Periventricular  hypoattenuation suggesting chronic microvascular disease. Vascular: No hyperdense vessel or unexpected vascular calcification. Skull: Normal visualized skull base, calvarium and extracranial soft tissues. Sinuses/Orbits:  No sinus fluid levels or advanced mucosal thickening. No mastoid effusion. Normal orbits. IMPRESSION: Chronic small vessel ischemia without acute intracranial abnormality. Electronically Signed   By: Ulyses Jarred M.D.   On: 07/11/2017 06:24   Dg Chest Portable 1 View  Result Date: 07/11/2017 CLINICAL DATA:  Altered mental status. EXAM: PORTABLE CHEST 1 VIEW COMPARISON:  07/04/2017 FINDINGS: R IJ HD catheter is in unchanged position. Unchanged cardiomediastinal contours no focal airspace disease. No pleural effusion or pneumothorax IMPRESSION: No active disease. Electronically Signed   By: Ulyses Jarred M.D.   On: 07/11/2017 06:28    EKG: Sinus rhythm LAE, consider biatrial enlargement Borderline right axis deviation Abnormal R-wave progression, late transition Probable left ventricular hypertrophy   Assessment/Plan Principal Problem:   Acute encephalopathy Active Problems:   Lupus   Current non-adherence to medical treatment   Malnutrition of moderate degree   Hypertensive crisis   ESRD (end stage renal disease) (H[facility])   Anemia   #1. Acute encephalopathy. Likely related to uncontrolled blood pressure. CT of the head without acute abnormalities. No signs symptoms of infectious process. Lab work consistent with end-stage renal disease. Improving at the time of admission -Admit -Improve blood pressure control -Neuro checks -Hold any altering medications -Nephrology consult  #2. Hypertensive crisis. Patient does have a history of noncompliance with medications. Home medications include felodipine, hydralazine. She is provided with hydralazine and labetalol in the emergency department. Blood pressures improving upon admission. Of note last admission she inquired nitroglycerin  drip in addition to her home meds. Of note discharge summary last week indicates labetalol discontinued at discharge -Continue home meds -Monitor -prn hydralazine  #3. End-stage renal disease. History of chronic kidney disease stage V. Recently started on dialysis has a Tuesday Thursday dialysis schedule. Creatinine 6.9 and potassium 5.1 on admission. -Nephrology consult -Home meds per nephrology  4. Anemia. Elated to chronic disease. Globin 10.7 on admission. This appears to be above her baseline. No signs symptoms of bleeding. -Monitor    DVT prophylaxis: scd  Code Status: full  Family Communication: none presen  Disposition Plan: home  Consults called: nephrology  Admission status: inpatient    Radene Gunning MD [facility]  If 7PM-7AM, please contact night-coverage [email or web address] Password [email or web address]  07/11/2017, 9:21 AM

## 2017-07-11 NOTE — ED Notes (Signed)
Attempted report x 2 

## 2017-07-11 NOTE — Progress Notes (Signed)
Pt received to unit at 650pm.  She is alert but will not answer my questions.  She is nonverbal right now. Vital Signs are stable.

## 2017-07-11 NOTE — ED Triage Notes (Addendum)
Patient brought in by [facility] EMS from home for altered mental status since Saturday after dialysis. Patient's boyfriend called EMS because "he couldn't get her out of the floor" and she "isn't acting right". Per EMS patient has vomited multiple times per boyfriend.

## 2017-07-11 NOTE — ED Notes (Signed)
Patient transported to CT 

## 2017-07-12 ENCOUNTER — Inpatient Hospital Stay (HOSPITAL_COMMUNITY): Payer: Medicare Other

## 2017-07-12 DIAGNOSIS — N186 End stage renal disease: Secondary | ICD-10-CM

## 2017-07-12 DIAGNOSIS — R4182 Altered mental status, unspecified: Secondary | ICD-10-CM

## 2017-07-12 DIAGNOSIS — D631 Anemia in chronic kidney disease: Secondary | ICD-10-CM

## 2017-07-12 DIAGNOSIS — Z9119 Patient's noncompliance with other medical treatment and regimen: Secondary | ICD-10-CM

## 2017-07-12 DIAGNOSIS — R1084 Generalized abdominal pain: Secondary | ICD-10-CM

## 2017-07-12 DIAGNOSIS — Z992 Dependence on renal dialysis: Secondary | ICD-10-CM

## 2017-07-12 DIAGNOSIS — G9340 Encephalopathy, unspecified: Secondary | ICD-10-CM

## 2017-07-12 LAB — PHOSPHORUS: Phosphorus: 7.7 mg/dL — ABNORMAL HIGH (ref 2.5–4.6)

## 2017-07-12 LAB — COMPREHENSIVE METABOLIC PANEL
ALT: 15 U/L (ref 14–54)
AST: 33 U/L (ref 15–41)
Albumin: 4 g/dL (ref 3.5–5.0)
Alkaline Phosphatase: 87 U/L (ref 38–126)
Anion gap: 20 — ABNORMAL HIGH (ref 5–15)
BUN: 52 mg/dL — ABNORMAL HIGH (ref 6–20)
CHLORIDE: 97 mmol/L — AB (ref 101–111)
CO2: 25 mmol/L (ref 22–32)
CREATININE: 8.9 mg/dL — AB (ref 0.44–1.00)
Calcium: 9.2 mg/dL (ref 8.9–10.3)
GFR calc non Af Amer: 4 mL/min — ABNORMAL LOW (ref >60)
GFR, EST AFRICAN AMERICAN: 5 mL/min — AB (ref >60)
Glucose, Bld: 86 mg/dL (ref 65–99)
POTASSIUM: 4.7 mmol/L (ref 3.5–5.1)
Sodium: 142 mmol/L (ref 135–145)
TOTAL PROTEIN: 7.7 g/dL (ref 6.5–8.1)
Total Bilirubin: 0.8 mg/dL (ref 0.3–1.2)

## 2017-07-12 LAB — CBC
HCT: 31.4 % — ABNORMAL LOW (ref 36.0–46.0)
Hemoglobin: 9.4 g/dL — ABNORMAL LOW (ref 12.0–15.0)
MCH: 28.7 pg (ref 26.0–34.0)
MCHC: 29.9 g/dL — ABNORMAL LOW (ref 30.0–36.0)
MCV: 95.7 fL (ref 78.0–100.0)
PLATELETS: 265 10*3/uL (ref 150–400)
RBC: 3.28 MIL/uL — AB (ref 3.87–5.11)
RDW: 14.6 % (ref 11.5–15.5)
WBC: 6.6 10*3/uL (ref 4.0–10.5)

## 2017-07-12 LAB — LIPASE, BLOOD: LIPASE: 76 U/L — AB (ref 11–51)

## 2017-07-12 LAB — AMYLASE: AMYLASE: 465 U/L — AB (ref 28–100)

## 2017-07-12 NOTE — Progress Notes (Signed)
Patient in Hemodialysis.  Per RN HD treatment ended 1.5 hr early because of a sudden drop in BP.  Per RN she also had some seizure like activity and decreased responsiveness.  Upon my arrival patient is alert and confused.  Follows occ commands and answers some questions. Per RN this is her baseline neuro status. BP 141/94 SR 94  RR 28 o2 sat 100% on RA.

## 2017-07-12 NOTE — Progress Notes (Addendum)
Subjective:  In Room  No cos , more interactive than inER yest BUT not still some cognitives deficits = Still not aware in HD earlier today  And not aware HD started last admit // noted in HD Abd pain , none now when I question her kub pend   Objective Vital signs in last 24 hours: Vitals:   07/12/17 0800 07/12/17 0830 07/12/17 0900 07/12/17 1144  BP: (!) 145/105 (!) 138/92 118/82   Pulse: 100 (!) 105 (!) 111   Resp:      Temp:      TempSrc:      SpO2:    98%  Weight:      Height:       Weight change:   Physical Exam: General: Cachetic thin , AAF  NAD , ,smiling ,folows commands today but pleasantly confused  Heart: RRR soft 1/6sem lsb , no rub or gallop Lungs: CTA ,unlabored breathing  Abdomen: bs pos ,soft ,nT,nD  Extremities: no pedlal edema Dialysis Access: pos bruit LUA AVF/ R  ij p. Cath   Dialysis Orders: Center: [place]   on TTS . EDW 50kg HD Bath 2k, 2.25ca  Time 4hr Heparin 1500. Access R IJ p. Cath  LUA AVF ( 2/ 25/19  inserted)     Mircera 200 q2wks (due 07/12/17) venofer 100mg  q hd x2 Calcitriol 2.101mcg q hd   Problem/Plan: 1. Htn  Crisis = missed meds now resolved , meds per admit team ,  No excess vol  // noted BP drop on HD this am / may be able to taper back bp meds /fu bp trend next 24hr  BP drop on HD, pt very confused, took off HD early, BP's back up.  Will TRY to keep SBP > 120 acutely with HTN urgency 2. AMS= multifactorial = HTN crisis and ?baseline  Cognitive deficits / ho SLE cns  3. ESRD -  Hd tts,  4. Anemia  - hgb 10.7 hold esa in am hd (due for 3/05 0 guve thurs if bp stable /fe on hd 2 more txs  For load  5. Metabolic bone disease -  Po vit  d on hd , binder, sensipar  6. ABD  PAIN - kub per admit  7. SLE  Ernest Haber, PA-C [facility] Beeper [phone] 07/12/2017,11:53 AM  LOS: 1 day   Pt seen, examined and agree w A/P as above.  [name] MD [facility] pager [phone]   07/12/2017, 1:03 PM    Labs: Basic  Metabolic Panel: Recent Labs  Lab 07/06/17 0624 07/06/17 1519 07/11/17 0526 07/12/17 0645  NA 136 132* 141 142  K 4.9 4.5 5.1 4.7  CL 99* 94* 97* 97*  CO2 21* 25 27 25   GLUCOSE 99 123* 101* 86  BUN 65* 29* 32* 52*  CREATININE 7.23* 4.36* 6.99* 8.90*  CALCIUM 8.4* 8.3* 9.6 9.2  PHOS 5.9* 4.0  --  7.7*   Liver Function Tests: Recent Labs  Lab 07/06/17 1519 07/11/17 0526 07/12/17 0645  AST  --  46* 33  ALT  --  18 15  ALKPHOS  --  90 87  BILITOT  --  0.4 0.8  PROT  --  7.6 7.7  ALBUMIN 3.5 4.1 4.0   Recent Labs  Lab 07/11/17 0526  LIPASE 74*   No results for input(s): AMMONIA in the last 168 hours. CBC: Recent Labs  Lab 07/06/17 0624 07/06/17 1519 07/11/17 0526 07/12/17 0645  WBC 5.0 5.5 6.9 6.6  HGB 8.5* 8.2* 10.7* 9.4*  HCT 27.5* 26.7* 35.4* 31.4*  MCV 94.8 94.0 96.2 95.7  PLT 273 257 295 265   Cardiac Enzymes: No results for input(s): CKTOTAL, CKMB, CKMBINDEX, TROPONINI in the last 168 hours. CBG: Recent Labs  Lab 07/11/17 0547  GLUCAP 85    Studies/Results: Ct Head Wo Contrast  Result Date: 07/11/2017 CLINICAL DATA:  Altered level of consciousness (LOC), unexplained EXAM: CT HEAD WITHOUT CONTRAST TECHNIQUE: Contiguous axial images were obtained from the base of the skull through the vertex without intravenous contrast. COMPARISON:  None. FINDINGS: Brain: No mass lesion, intraparenchymal hemorrhage or extra-axial collection. No evidence of acute cortical infarct. Periventricular hypoattenuation suggesting chronic microvascular disease. Vascular: No hyperdense vessel or unexpected vascular calcification. Skull: Normal visualized skull base, calvarium and extracranial soft tissues. Sinuses/Orbits: No sinus fluid levels or advanced mucosal thickening. No mastoid effusion. Normal orbits. IMPRESSION: Chronic small vessel ischemia without acute intracranial abnormality. Electronically Signed   By: Ulyses Jarred M.D.   On: 07/11/2017 06:24   Dg Chest Portable 1  View  Result Date: 07/11/2017 CLINICAL DATA:  Altered mental status. EXAM: PORTABLE CHEST 1 VIEW COMPARISON:  07/04/2017 FINDINGS: R IJ HD catheter is in unchanged position. Unchanged cardiomediastinal contours no focal airspace disease. No pleural effusion or pneumothorax IMPRESSION: No active disease. Electronically Signed   By: Ulyses Jarred M.D.   On: 07/11/2017 06:28   Medications: . sodium chloride    . ferric gluconate (FERRLECIT/NULECIT) IV     . albuterol  2.5 mg Nebulization BID  . calcitRIOL  2 mcg Oral Q T,Th,Sa-HD  . cinacalcet  60 mg Oral Q supper  . famotidine  10 mg Oral Daily  . felodipine  10 mg Oral Daily  . heparin  5,000 Units Subcutaneous Q8H  . hydrALAZINE  25 mg Oral Q8H  . hydroxychloroquine  200 mg Oral Daily  . labetalol  20 mg Intravenous Once  . mycophenolate  1,000 mg Oral BID  . sevelamer carbonate  800 mg Oral TID WC  . sodium bicarbonate  650 mg Oral Daily  . sodium chloride flush  3 mL Intravenous Q12H

## 2017-07-12 NOTE — Progress Notes (Signed)
[facility]                                                                              Patient Demographics  Kathleen [name], is a 57 y.o. female, DOB - [birth year], TDH:[id number]  Admit date - 07/11/2017   Admitting Physician Karmen Bongo, MD  Outpatient Primary MD for the patient is System, Pcp Not In  Outpatient specialists:   LOS - 1  days   Medical records reviewed and are as summarized below:    Chief Complaint  Patient presents with  . Altered Mental Status       Brief summary  Patient is a 57 year old female with history of lupus, hypertension, ESRD, recently started on HD, TIAs, COPD presented with altered mental status. Patient was brought in by EMS with 2 day history of confusion, not acting right. Attended HD 2 days prior to admission, completed the sessions. Family noted more lethargic, one episode of emesis, no hematemesis. The data before the admission, patient was lying on the floor and would not get up. She was admitted 3 weeks ago by [facility] for hypertensive crisis.   Assessment & Plan    Principal Problem:   Acute metabolic encephalopathy - Unclear etiology possibly due to hypertensive emergency. BP 181/144 at the time of admission with tachycardia - CT head showed no acute abnormality - Currently alert and awake, complaining of abdominal pain. Poor historian, question if patient has underlying cognitive deficit. Obtain MRI of the brain for further workup.  Active Problems:   ESRD (end stage renal disease) ([place]) - On HD, TTS - Nephrology consulted  Hypertensive emergency - BP 181/144 at the time of admission, poor baseline control. Patient was admitted one year ago for hypertensive crisis, then again on 06/29/17. - Continue felodipine, hydralazine, received labetalol 20 mg IV X1 in ED. Patient has been on labetalol 50 mg twice a day, prior to the previous admission. - If BP trends up, may add labetalol.    Lupus - Currently stable   Current non-adherence to medical treatment - Patient counseled on compliance with medications    Malnutrition of moderate degree - Nutrition recommendations will be appreciated    Anemia - Baseline hemoglobin 8-9 -Likely anemia of chronic disease, no symptoms of bleeding, H&H stable  Abdominal pain - Patient seen and examined and HD unit, complaining of diffuse abdominal pain, no nausea vomiting or diarrhea. No fevers - Obtain KUB  Code Status: Full CODE STATUS DVT Prophylaxis: heparin subcutaneous Family Communication: Discussed in detail with the patient, all imaging results, lab results explained to the patient    Disposition Plan: Hopefully next 24-48 hours, clinically improving  Time Spent in minutes   35 minutes  Procedures:  HD  Consultants:   Nephrology  Antimicrobials:      Medications  Scheduled Meds: . albuterol  2.5 mg Nebulization BID  . calcitRIOL  2 mcg Oral Q T,Th,Sa-HD  . cinacalcet  60 mg Oral Q supper  . famotidine  10 mg Oral Daily  . felodipine  10 mg Oral Daily  . heparin  5,000 Units Subcutaneous Q8H  . hydrALAZINE  25 mg Oral  Q8H  . hydroxychloroquine  200 mg Oral Daily  . labetalol  20 mg Intravenous Once  . mycophenolate  1,000 mg Oral BID  . sevelamer carbonate  800 mg Oral TID WC  . sodium bicarbonate  650 mg Oral Daily  . sodium chloride flush  3 mL Intravenous Q12H   Continuous Infusions: . sodium chloride    . ferric gluconate (FERRLECIT/NULECIT) IV     PRN Meds:.sodium chloride, acetaminophen **OR** acetaminophen, ondansetron **OR** ondansetron (ZOFRAN) IV, sodium chloride flush   Antibiotics   Anti-infectives (From admission, onward)   Start     Dose/Rate Route Frequency Ordered Stop   07/11/17 1000  hydroxychloroquine (PLAQUENIL) tablet 200 mg     200 mg Oral Daily 07/11/17 0829          Subjective:   Kathleen [name] was seen and examined today.  Complaining of diffuse abdominal pain, no nausea or vomiting.   Patient denies dizziness, chest pain, shortness of breath.   Objective:   Vitals:   07/12/17 0730 07/12/17 0800 07/12/17 0830 07/12/17 0900  BP: (!) 161/102 (!) 145/105 (!) 138/92 118/82  Pulse: 88 100 (!) 105 (!) 111  Resp:      Temp:      TempSrc:      SpO2:      Weight:      Height:       No intake or output data in the 24 hours ending 07/12/17 1116   Wt Readings from Last 3 Encounters:  07/12/17 47.9 kg (105 lb 9.6 oz)  07/07/17 51.2 kg (112 lb 14 oz)  07/15/16 50.2 kg (110 lb 10.7 oz)     Exam    General: Alert and oriented to self, poor historian, yelling through the exam  Eyes:   HEENT:  Atraumatic, normocephalic, normal oropharynx  Cardiovascular: S1 S2 clear, Regular rate and rhythm. No pedal edema b/l  Respiratory: Decreased breath sounds at the bases  Gastrointestinal: Soft, diffuse tenderness to deep palpation, curled up in the bed,difficult to examine  Ext: no pedal edema bilaterally  Neuro: alert and awake, oriented to self, moving all 4 extremities but difficult to examine  Musculoskeletal: No digital cyanosis, clubbing  Skin: No rashes  Psych: alert   Data Reviewed:  I have personally reviewed following labs and imaging studies  Micro Results No results found for this or any previous visit (from the past 240 hour(s)).  Radiology Reports Ct Head Wo Contrast  Result Date: 07/11/2017 CLINICAL DATA:  Altered level of consciousness (LOC), unexplained EXAM: CT HEAD WITHOUT CONTRAST TECHNIQUE: Contiguous axial images were obtained from the base of the skull through the vertex without intravenous contrast. COMPARISON:  None. FINDINGS: Brain: No mass lesion, intraparenchymal hemorrhage or extra-axial collection. No evidence of acute cortical infarct. Periventricular hypoattenuation suggesting chronic microvascular disease. Vascular: No hyperdense vessel or unexpected vascular calcification. Skull: Normal visualized skull base, calvarium and  extracranial soft tissues. Sinuses/Orbits: No sinus fluid levels or advanced mucosal thickening. No mastoid effusion. Normal orbits. IMPRESSION: Chronic small vessel ischemia without acute intracranial abnormality. Electronically Signed   By: Ulyses Jarred M.D.   On: 07/11/2017 06:24   Dg Chest Portable 1 View  Result Date: 07/11/2017 CLINICAL DATA:  Altered mental status. EXAM: PORTABLE CHEST 1 VIEW COMPARISON:  07/04/2017 FINDINGS: R IJ HD catheter is in unchanged position. Unchanged cardiomediastinal contours no focal airspace disease. No pleural effusion or pneumothorax IMPRESSION: No active disease. Electronically Signed   By: Ulyses Jarred M.D.   On:  07/11/2017 06:28   Dg Chest Port 1 View  Result Date: 07/04/2017 CLINICAL DATA:  Dialysis catheter insertion. EXAM: PORTABLE CHEST 1 VIEW COMPARISON:  Chest x-ray dated June 29, 2017. FINDINGS: Interval placement of a right internal jugular tunnel dialysis catheter with the tip at the cavoatrial junction. Stable cardiomegaly. Resolved interstitial edema. No focal consolidation, pleural effusion, or pneumothorax. No acute osseous abnormality. IMPRESSION: 1. Appropriately positioned tunneled right internal jugular dialysis catheter. No pneumothorax. 2. Stable cardiomegaly.  Resolved interstitial pulmonary edema. Electronically Signed   By: Titus Dubin M.D.   On: 07/04/2017 14:33   Dg Chest Port 1 View  Result Date: 06/29/2017 CLINICAL DATA:  Shortness of breath, COPD EXAM: PORTABLE CHEST 1 VIEW COMPARISON:  07/08/2016 FINDINGS: Hyperinflated lungs as can be seen with COPD. Diffuse bilateral interstitial thickening. Small right pleural effusion. No left pleural effusion. No pneumothorax. Stable cardiomegaly. No acute osseous abnormality. IMPRESSION: Mild CHF. Electronically Signed   By: Kathreen Devoid   On: 06/29/2017 10:15    Lab Data:  CBC: Recent Labs  Lab 07/06/17 0624 07/06/17 1519 07/11/17 0526 07/12/17 0645  WBC 5.0 5.5 6.9 6.6    HGB 8.5* 8.2* 10.7* 9.4*  HCT 27.5* 26.7* 35.4* 31.4*  MCV 94.8 94.0 96.2 95.7  PLT 273 257 295 401   Basic Metabolic Panel: Recent Labs  Lab 07/06/17 0624 07/06/17 1519 07/11/17 0526 07/12/17 0645  NA 136 132* 141 142  K 4.9 4.5 5.1 4.7  CL 99* 94* 97* 97*  CO2 21* 25 27 25   GLUCOSE 99 123* 101* 86  BUN 65* 29* 32* 52*  CREATININE 7.23* 4.36* 6.99* 8.90*  CALCIUM 8.4* 8.3* 9.6 9.2  PHOS 5.9* 4.0  --  7.7*   GFR: Estimated Creatinine Clearance: 5.3 mL/min (A) (by C-G formula based on SCr of 8.9 mg/dL (H)). Liver Function Tests: Recent Labs  Lab 07/06/17 0624 07/06/17 1519 07/11/17 0526 07/12/17 0645  AST  --   --  46* 33  ALT  --   --  18 15  ALKPHOS  --   --  90 87  BILITOT  --   --  0.4 0.8  PROT  --   --  7.6 7.7  ALBUMIN 3.6 3.5 4.1 4.0   Recent Labs  Lab 07/11/17 0526  LIPASE 74*   No results for input(s): AMMONIA in the last 168 hours. Coagulation Profile: No results for input(s): INR, PROTIME in the last 168 hours. Cardiac Enzymes: No results for input(s): CKTOTAL, CKMB, CKMBINDEX, TROPONINI in the last 168 hours. BNP (last 3 results) No results for input(s): PROBNP in the last 8760 hours. HbA1C: No results for input(s): HGBA1C in the last 72 hours. CBG: Recent Labs  Lab 07/11/17 0547  GLUCAP 85   Lipid Profile: No results for input(s): CHOL, HDL, LDLCALC, TRIG, CHOLHDL, LDLDIRECT in the last 72 hours. Thyroid Function Tests: No results for input(s): TSH, T4TOTAL, FREET4, T3FREE, THYROIDAB in the last 72 hours. Anemia Panel: No results for input(s): VITAMINB12, FOLATE, FERRITIN, TIBC, IRON, RETICCTPCT in the last 72 hours. Urine analysis:    Component Value Date/Time   COLORURINE STRAW (A) 06/29/2017 1428   APPEARANCEUR CLEAR 06/29/2017 1428   LABSPEC 1.009 06/29/2017 1428   PHURINE 6.0 06/29/2017 1428   GLUCOSEU NEGATIVE 06/29/2017 1428   HGBUR SMALL (A) 06/29/2017 1428   BILIRUBINUR NEGATIVE 06/29/2017 1428   KETONESUR NEGATIVE  06/29/2017 1428   PROTEINUR 100 (A) 06/29/2017 1428   NITRITE NEGATIVE 06/29/2017 1428   LEUKOCYTESUR NEGATIVE  06/29/2017 1428     Adalid Beckmann M.D. [facility] 07/12/2017, 11:16 AM  Pager: [phone] Between 7am to 7pm - call Pager - [phone]  After 7pm go to [email or web address] - password [email or web address]  Call night coverage person covering after 7pm

## 2017-07-12 NOTE — Progress Notes (Signed)
With 1.5 hours remaining of dialysis treatment patient became diaphoretic and more confused.  B/p noted to be 100/70, UF turned off normal saline bolus of 200 given.  B/p rechecked and read 99/83.  Dr. Jonnie Finner notified of b/p and change in behavior.  Orders received to discontinue treatment and give NS until b/p is greater than 120.  Blood returned and b/p noted at 146/99.  Post returning patient blood patient noted to have seizure like activity and was more difficult to arouse.  Patient has a history of seizures.  Dr. Jonnie Finner notified and presented to the unit to access patient.  Rapid response also notified of patient's change since initiating dialysis.  Upon Dr. Jonnie Finner and rapid response arrival patient was returning to baseline. After the rapid response assessment patient was deemed safe to return to original bed assignment on 5W.  VSS prior to leaving the dialysis unit.  145/90, 101, 16, 98.0, and 97% on RA.

## 2017-07-12 NOTE — Progress Notes (Signed)
Patient off the unit in dialysis during shift change will assess patient upon arrival back to the unit.

## 2017-07-13 LAB — BASIC METABOLIC PANEL
ANION GAP: 19 — AB (ref 5–15)
BUN: 37 mg/dL — ABNORMAL HIGH (ref 6–20)
CO2: 21 mmol/L — AB (ref 22–32)
Calcium: 9.4 mg/dL (ref 8.9–10.3)
Chloride: 97 mmol/L — ABNORMAL LOW (ref 101–111)
Creatinine, Ser: 7.25 mg/dL — ABNORMAL HIGH (ref 0.44–1.00)
GFR, EST AFRICAN AMERICAN: 7 mL/min — AB (ref >60)
GFR, EST NON AFRICAN AMERICAN: 6 mL/min — AB (ref >60)
Glucose, Bld: 82 mg/dL (ref 65–99)
Potassium: 4.4 mmol/L (ref 3.5–5.1)
Sodium: 137 mmol/L (ref 135–145)

## 2017-07-13 LAB — CBC
HEMATOCRIT: 32.9 % — AB (ref 36.0–46.0)
Hemoglobin: 10.1 g/dL — ABNORMAL LOW (ref 12.0–15.0)
MCH: 29.2 pg (ref 26.0–34.0)
MCHC: 30.7 g/dL (ref 30.0–36.0)
MCV: 95.1 fL (ref 78.0–100.0)
Platelets: 242 10*3/uL (ref 150–400)
RBC: 3.46 MIL/uL — AB (ref 3.87–5.11)
RDW: 14.7 % (ref 11.5–15.5)
WBC: 6.1 10*3/uL (ref 4.0–10.5)

## 2017-07-13 MED ORDER — HYDROXYCHLOROQUINE SULFATE 200 MG PO TABS: 200.0000 mg | ORAL_TABLET | Freq: Every day | ORAL | 3 refills | Status: DC

## 2017-07-13 MED ORDER — SODIUM BICARBONATE 650 MG PO TABS: 650.0000 mg | ORAL_TABLET | Freq: Every day | ORAL | 3 refills | Status: DC

## 2017-07-13 MED ORDER — GABAPENTIN 300 MG PO CAPS: 300.0000 mg | ORAL_CAPSULE | Freq: Every day | ORAL | 3 refills | Status: DC

## 2017-07-13 MED ORDER — HYDRALAZINE HCL 25 MG PO TABS: 25.0000 mg | ORAL_TABLET | Freq: Three times a day (TID) | ORAL | 3 refills | Status: DC

## 2017-07-13 MED ORDER — MYCOPHENOLATE MOFETIL 500 MG PO TABS: 1000.0000 mg | ORAL_TABLET | Freq: Two times a day (BID) | ORAL | 3 refills | Status: DC

## 2017-07-13 MED ORDER — SEVELAMER CARBONATE 800 MG PO TABS: 800.0000 mg | ORAL_TABLET | Freq: Three times a day (TID) | ORAL | 3 refills | Status: DC

## 2017-07-13 MED ORDER — FELODIPINE ER 5 MG PO TB24: 10.0000 mg | ORAL_TABLET | Freq: Every day | ORAL | 4 refills | Status: DC

## 2017-07-13 NOTE — Progress Notes (Signed)
Subjective:  Looking better today, fully oriented x 3.   Objective Vital signs in last 24 hours: Vitals:   07/13/17 0500 07/13/17 0528 07/13/17 0757 07/13/17 0846  BP:  (!) 152/125  118/89  Pulse:  (!) 106    Resp:  18    Temp:  99.9 F (37.7 C)    TempSrc:  Oral    SpO2:  99% 99%   Weight: 48.7 kg (107 lb 5.8 oz)     Height:       Weight change: -2.903 kg (-6.4 oz)  Physical Exam: General: thin, Ox 3 today, much better Heart: RRR soft 1/6sem lsb , no rub or gallop Lungs: CTA ,unlabored breathing  Abdomen: bs pos ,soft ,nT,nD  Extremities: no pedlal edema Dialysis Access: pos bruit LUA AVF/ R  ij p. Cath   D[facility]: [name] 4h  2/2.5 bath  50kg   Hep 1500   R IJ TDC/ LUA AVF (created 2/25) - Mircera 200 q2wks (due 07/12/17)  - venofer 100mg  q hd x2  - Calcitriol 2.56mcg q hd   Assessment/Plan: 1. Htn  Crisis = missed taking home BP meds.  Discussed with primary team and patient today, she has help from her "fiance" if she misses her medications. Not a great situation. Suggest home health assistance if possible when dc'd.  2. ESRD -  HD TTS 3. Anemia  - hgb 10.7 hold esa in am hd (due for 3/05 0 guve thurs if bp stable /fe on hd 2 more txs  For load  4. Metabolic bone disease -  Po vit  d on hd , binder, sensipar 5. SLE   [name] MD [facility] pager [phone]   07/13/2017, 11:40 AM   Labs: Basic Metabolic Panel: Recent Labs  Lab 07/06/17 1519 07/11/17 0526 07/12/17 0645 07/13/17 0459  NA 132* 141 142 137  K 4.5 5.1 4.7 4.4  CL 94* 97* 97* 97*  CO2 25 27 25  21*  GLUCOSE 123* 101* 86 82  BUN 29* 32* 52* 37*  CREATININE 4.36* 6.99* 8.90* 7.25*  CALCIUM 8.3* 9.6 9.2 9.4  PHOS 4.0  --  7.7*  --    Liver Function Tests: Recent Labs  Lab 07/06/17 1519 07/11/17 0526 07/12/17 0645  AST  --  46* 33  ALT  --  18 15  ALKPHOS  --  90 87  BILITOT  --  0.4 0.8  PROT  --  7.6 7.7  ALBUMIN 3.5 4.1 4.0   Recent Labs  Lab 07/11/17 0526  07/12/17 1136  LIPASE 74* 76*  AMYLASE  --  465*   No results for input(s): AMMONIA in the last 168 hours. CBC: Recent Labs  Lab 07/06/17 1519 07/11/17 0526 07/12/17 0645 07/13/17 0459  WBC 5.5 6.9 6.6 6.1  HGB 8.2* 10.7* 9.4* 10.1*  HCT 26.7* 35.4* 31.4* 32.9*  MCV 94.0 96.2 95.7 95.1  PLT 257 295 265 242   Cardiac Enzymes: No results for input(s): CKTOTAL, CKMB, CKMBINDEX, TROPONINI in the last 168 hours. CBG: Recent Labs  Lab 07/11/17 0547  GLUCAP 85    Studies/Results: Mr Brain Wo Contrast  Result Date: 07/12/2017 CLINICAL DATA:  57 y/o F; unexplained altered level of consciousness. EXAM: MRI HEAD WITHOUT CONTRAST TECHNIQUE: Axial DWI sequence was obtained. Patient was confused and unable to continue the examination. COMPARISON:  07/11/2017 CT head FINDINGS: Motion degraded axial DWI sequence. No gross diffusion signal abnormality to suggest acute or early subacute infarction. IMPRESSION: Motion degraded  axial DWI sequence. No gross diffusion signal abnormality to suggest acute or early subacute infarction. Electronically Signed   By: Kristine Garbe M.D.   On: 07/12/2017 18:53   Dg Abd 2 Views  Result Date: 07/12/2017 CLINICAL DATA:  Abdominal pain EXAM: ABDOMEN - 2 VIEW COMPARISON:  None. FINDINGS: Scattered large and small bowel gas is noted. No free air is seen. Mild vascular calcifications are noted. Some calcifications are noted to the right of the midline just above the iliac crest likely related to ingested content or calcified phleboliths. Mild vascular calcifications are noted as well. IMPRESSION: No acute abnormality noted. Electronically Signed   By: Inez Catalina M.D.   On: 07/12/2017 14:02   Medications: . sodium chloride    . ferric gluconate (FERRLECIT/NULECIT) IV Stopped (07/12/17 1334)   . albuterol  2.5 mg Nebulization BID  . calcitRIOL  2 mcg Oral Q T,Th,Sa-HD  . cinacalcet  60 mg Oral Q supper  . famotidine  10 mg Oral Daily  . felodipine   10 mg Oral Daily  . heparin  5,000 Units Subcutaneous Q8H  . hydrALAZINE  25 mg Oral Q8H  . hydroxychloroquine  200 mg Oral Daily  . labetalol  20 mg Intravenous Once  . mycophenolate  1,000 mg Oral BID  . sevelamer carbonate  800 mg Oral TID WC  . sodium bicarbonate  650 mg Oral Daily  . sodium chloride flush  3 mL Intravenous Q12H

## 2017-07-13 NOTE — Progress Notes (Signed)
Nsg Discharge Note  Admit Date:  07/11/2017 Discharge date: 07/13/2017   Kathleen [name] to be D/C'd to friend's Home per MD order.  AVS completed.  Copy for chart, and copy for patient signed, and dated. Patient/caregiver able to verbalize understanding.  Discharge Medication: Allergies as of 07/13/2017   No Known Allergies     Medication List    TAKE these medications   calcitRIOL 0.5 MCG capsule Commonly known as:  ROCALTROL Take 4 capsules (2 mcg total) by mouth every other day.   cinacalcet 30 MG tablet Commonly known as:  SENSIPAR Take 2 tablets (60 mg total) by mouth daily with supper.   felodipine 5 MG 24 hr tablet Commonly known as:  PLENDIL Take 2 tablets (10 mg total) by mouth daily.   ferrous gluconate 324 MG tablet Commonly known as:  FERGON Take 324 mg by mouth daily.   gabapentin 300 MG capsule Commonly known as:  NEURONTIN Take 1 capsule (300 mg total) by mouth daily.   hydrALAZINE 25 MG tablet Commonly known as:  APRESOLINE Take 1 tablet (25 mg total) by mouth 3 (three) times daily. What changed:  when to take this   hydroxychloroquine 200 MG tablet Commonly known as:  PLAQUENIL Take 1 tablet (200 mg total) by mouth daily.   lidocaine-prilocaine cream Commonly known as:  EMLA Apply 1 application topically as needed (topical anesthesia for hemodialysis if Gebauers and Lidocaine injection are ineffective.).   multivitamin Tabs tablet Take 1 tablet by mouth at bedtime.   mycophenolate 500 MG tablet Commonly known as:  CELLCEPT Take 2 tablets (1,000 mg total) by mouth 2 (two) times daily.   ranitidine 150 MG tablet Commonly known as:  ZANTAC Take 150 mg by mouth daily.   sevelamer carbonate 800 MG tablet Commonly known as:  RENVELA Take 1 tablet (800 mg total) by mouth 3 (three) times daily with meals.   sodium bicarbonate 650 MG tablet Take 1 tablet (650 mg total) by mouth daily.       Discharge Assessment: Vitals:   07/13/17 0846  07/13/17 1446  BP: 118/89 (!) 141/98  Pulse:  97  Resp:  20  Temp:  98.2 F (36.8 C)  SpO2:  100%   Skin clean, dry and intact without evidence of skin break down, no evidence of skin tears noted. IV catheter discontinued intact. Site without signs and symptoms of complications - no redness or edema noted at insertion site, patient denies c/o pain - only slight tenderness at site.  Dressing with slight pressure applied.  D/c Instructions-Education: Discharge instructions given to patient/family with verbalized understanding. D/c education completed with patient/family including follow up instructions, medication list, d/c activities limitations if indicated, with other d/c instructions as indicated by MD - patient able to verbalize understanding, all questions fully answered. Patient instructed to return to ED, call 911, or call MD for any changes in condition.  Patient escorted via [place], and D/C to friend's home via taxi.  Kalis Friese Margaretha Sheffield, RN 07/13/2017 6:51 PM

## 2017-07-13 NOTE — Progress Notes (Signed)
All discharge information given to patient with prescriptions. Patient phone located in closet before lunch. I have been asking her since 1100 if she had or could get a ride home. She stated her fiance who she calls [place], Wille Glaser is who she would call. She initially couldn't remember his number. After finding her phone she called with no answer. I asked if there was anyone else she could call and she said no. I later called [place] and left a message. His message said Creta Levin. She then said yes that is his nickname. She has said many things to me today that has led me to believe her mentation is waxing and waning. She said something about having a stroke, seizure and asked me about a diabetic diet. She believes it is Thursday and thinks she should be going to dialysis today. She mentioned the bus but has no key to get in the house and I don't feel that this is a safe disposition. Will await a call back from [place] and if not heard back will recall him.

## 2017-07-13 NOTE — Discharge Summary (Signed)
Physician Discharge Summary   Patient ID: Kathleen [name] MRN: [id number] DOB/AGE: 1960/07/06 57 y.o.  Admit date: 07/11/2017 Discharge date: 07/13/2017  Primary Care Physician:  System, Pcp Not In   Recommendations for Outpatient Follow-up:  1. Follow up with PCP in 1-2 weeks 2. Please obtain BMP/CBC in one week  Home Health: Home health PT, RN to be arranged by the case management Equipment/Devices: None  Discharge Condition: stable  CODE STATUS: FULL  Diet recommendation: Renal diet   Discharge Diagnoses:   . Hypertensive crisis . Lupus . Malnutrition of moderate degree . Acute encephalopathy . ESRD (end stage renal disease) ([place]) . Medical noncompliance . Anemia   Consults: Nephrology    Allergies:  No Known Allergies   DISCHARGE MEDICATIONS: Allergies as of 07/13/2017   No Known Allergies     Medication List    TAKE these medications   calcitRIOL 0.5 MCG capsule Commonly known as:  ROCALTROL Take 4 capsules (2 mcg total) by mouth every other day.   cinacalcet 30 MG tablet Commonly known as:  SENSIPAR Take 2 tablets (60 mg total) by mouth daily with supper.   felodipine 5 MG 24 hr tablet Commonly known as:  PLENDIL Take 2 tablets (10 mg total) by mouth daily.   ferrous gluconate 324 MG tablet Commonly known as:  FERGON Take 324 mg by mouth daily.   gabapentin 300 MG capsule Commonly known as:  NEURONTIN Take 1 capsule (300 mg total) by mouth daily.   hydrALAZINE 25 MG tablet Commonly known as:  APRESOLINE Take 1 tablet (25 mg total) by mouth 3 (three) times daily. What changed:  when to take this   hydroxychloroquine 200 MG tablet Commonly known as:  PLAQUENIL Take 1 tablet (200 mg total) by mouth daily.   lidocaine-prilocaine cream Commonly known as:  EMLA Apply 1 application topically as needed (topical anesthesia for hemodialysis if Gebauers and Lidocaine injection are ineffective.).   multivitamin Tabs tablet Take 1 tablet by mouth  at bedtime.   mycophenolate 500 MG tablet Commonly known as:  CELLCEPT Take 2 tablets (1,000 mg total) by mouth 2 (two) times daily.   ranitidine 150 MG tablet Commonly known as:  ZANTAC Take 150 mg by mouth daily.   sevelamer carbonate 800 MG tablet Commonly known as:  RENVELA Take 1 tablet (800 mg total) by mouth 3 (three) times daily with meals.   sodium bicarbonate 650 MG tablet Take 1 tablet (650 mg total) by mouth daily.        Brief H and P: For complete details please refer to admission H and P, but in brief Patient is a 57 year old female with history of lupus, hypertension, ESRD, recently started on HD, TIAs, COPD presented with altered mental status. Patient was brought in by EMS with 2 day history of confusion, not acting right. Attended HD 2 days prior to admission, completed the sessions. Family noted more lethargic, one episode of emesis, no hematemesis. The data before the admission, patient was lying on the floor and would not get up. She was admitted 3 weeks ago by [facility] for hypertensive crisis.   Hospital Course:    Acute metabolic encephalopathy: Likely due to hypertensive crisis - Resolved, alert and oriented 3. - Unclear etiology possibly due to hypertensive emergency. BP 181/144 at the time of admission with tachycardia - CT head showed no acute abnormality, MRI of the brain showed no acute or early subacute infarction. -  patient recommended strongly to take her BP medications as  advised.    ESRD (end stage renal disease) ([place]) - On HD, TTS, Patient received hemodialysis per her schedule on 3/5   Hypertensive emergency - BP 181/144 at the time of admission, poor baseline control. Patient was admitted one year ago for hypertensive crisis, then again on 06/29/17. - Continue felodipine, hydralazine, received labetalol 20 mg IV X1 in ED. Patient has been on labetalol 50 mg twice a day, prior to the previous admission. Currently BP stable on felodipine and  hydralazine. -Per patient, her fianc takes care of her medications although she misses the medications. Counseled strongly on medication compliance. Home health RN arranged for teaching.    Lupus - Currently stable    Current non-adherence to medical treatment - Patient counseled on compliance with medications    Malnutrition of moderate degree - patient counseled on good nutrition  Anemia of chronic disease - Baseline hemoglobin 8-9 -Likely anemia of chronic disease, no symptoms of bleeding, H&H stable  Abdominal pain -  resolved, KUB negative for any SBO    Day of Discharge S: Alert and oriented, feels better, no complaints.  BP 118/89   Pulse (!) 106   Temp 99.9 F (37.7 C) (Oral)   Resp 18   Ht 5\' 4"  (1.626 m)   Wt 48.7 kg (107 lb 5.8 oz)   SpO2 99%   BMI 18.43 kg/m   Physical Exam: General: Alert and awake oriented x3 not in any acute distress. HEENT: anicteric sclera, pupils reactive to light and accommodation CVS: S1-S2 clear, RRR  Chest: clear to auscultation bilaterally, no wheezing rales or rhonchi Abdomen: soft nontender, nondistended, normal bowel sounds Extremities: no cyanosis, clubbing or edema noted bilaterally Neuro: Cranial nerves II-XII intact, no focal neurological deficits   The results of significant diagnostics from this hospitalization (including imaging, microbiology, ancillary and laboratory) are listed below for reference.      Procedures/Studies:  Ct Head Wo Contrast  Result Date: 07/11/2017 CLINICAL DATA:  Altered level of consciousness (LOC), unexplained EXAM: CT HEAD WITHOUT CONTRAST TECHNIQUE: Contiguous axial images were obtained from the base of the skull through the vertex without intravenous contrast. COMPARISON:  None. FINDINGS: Brain: No mass lesion, intraparenchymal hemorrhage or extra-axial collection. No evidence of acute cortical infarct. Periventricular hypoattenuation suggesting chronic microvascular disease.  Vascular: No hyperdense vessel or unexpected vascular calcification. Skull: Normal visualized skull base, calvarium and extracranial soft tissues. Sinuses/Orbits: No sinus fluid levels or advanced mucosal thickening. No mastoid effusion. Normal orbits. IMPRESSION: Chronic small vessel ischemia without acute intracranial abnormality. Electronically Signed   By: Ulyses Jarred M.D.   On: 07/11/2017 06:24   Mr Brain Wo Contrast  Result Date: 07/12/2017 CLINICAL DATA:  57 y/o F; unexplained altered level of consciousness. EXAM: MRI HEAD WITHOUT CONTRAST TECHNIQUE: Axial DWI sequence was obtained. Patient was confused and unable to continue the examination. COMPARISON:  07/11/2017 CT head FINDINGS: Motion degraded axial DWI sequence. No gross diffusion signal abnormality to suggest acute or early subacute infarction. IMPRESSION: Motion degraded axial DWI sequence. No gross diffusion signal abnormality to suggest acute or early subacute infarction. Electronically Signed   By: Kristine Garbe M.D.   On: 07/12/2017 18:53   Dg Chest Portable 1 View  Result Date: 07/11/2017 CLINICAL DATA:  Altered mental status. EXAM: PORTABLE CHEST 1 VIEW COMPARISON:  07/04/2017 FINDINGS: R IJ HD catheter is in unchanged position. Unchanged cardiomediastinal contours no focal airspace disease. No pleural effusion or pneumothorax IMPRESSION: No active disease. Electronically Signed   By: Lennette Bihari  Collins [place] M.D.   On: 07/11/2017 06:28   Dg Chest Port 1 View  Result Date: 07/04/2017 CLINICAL DATA:  Dialysis catheter insertion. EXAM: PORTABLE CHEST 1 VIEW COMPARISON:  Chest x-ray dated June 29, 2017. FINDINGS: Interval placement of a right internal jugular tunnel dialysis catheter with the tip at the cavoatrial junction. Stable cardiomegaly. Resolved interstitial edema. No focal consolidation, pleural effusion, or pneumothorax. No acute osseous abnormality. IMPRESSION: 1. Appropriately positioned tunneled right internal jugular  dialysis catheter. No pneumothorax. 2. Stable cardiomegaly.  Resolved interstitial pulmonary edema. Electronically Signed   By: Titus Dubin M.D.   On: 07/04/2017 14:33   Dg Chest Port 1 View  Result Date: 06/29/2017 CLINICAL DATA:  Shortness of breath, COPD EXAM: PORTABLE CHEST 1 VIEW COMPARISON:  07/08/2016 FINDINGS: Hyperinflated lungs as can be seen with COPD. Diffuse bilateral interstitial thickening. Small right pleural effusion. No left pleural effusion. No pneumothorax. Stable cardiomegaly. No acute osseous abnormality. IMPRESSION: Mild CHF. Electronically Signed   By: Kathreen Devoid   On: 06/29/2017 10:15   Dg Abd 2 Views  Result Date: 07/12/2017 CLINICAL DATA:  Abdominal pain EXAM: ABDOMEN - 2 VIEW COMPARISON:  None. FINDINGS: Scattered large and small bowel gas is noted. No free air is seen. Mild vascular calcifications are noted. Some calcifications are noted to the right of the midline just above the iliac crest likely related to ingested content or calcified phleboliths. Mild vascular calcifications are noted as well. IMPRESSION: No acute abnormality noted. Electronically Signed   By: Inez Catalina M.D.   On: 07/12/2017 14:02       LAB RESULTS: Basic Metabolic Panel: Recent Labs  Lab 07/12/17 0645 07/13/17 0459  NA 142 137  K 4.7 4.4  CL 97* 97*  CO2 25 21*  GLUCOSE 86 82  BUN 52* 37*  CREATININE 8.90* 7.25*  CALCIUM 9.2 9.4  PHOS 7.7*  --    Liver Function Tests: Recent Labs  Lab 07/11/17 0526 07/12/17 0645  AST 46* 33  ALT 18 15  ALKPHOS 90 87  BILITOT 0.4 0.8  PROT 7.6 7.7  ALBUMIN 4.1 4.0   Recent Labs  Lab 07/11/17 0526 07/12/17 1136  LIPASE 74* 76*  AMYLASE  --  465*   No results for input(s): AMMONIA in the last 168 hours. CBC: Recent Labs  Lab 07/12/17 0645 07/13/17 0459  WBC 6.6 6.1  HGB 9.4* 10.1*  HCT 31.4* 32.9*  MCV 95.7 95.1  PLT 265 242   Cardiac Enzymes: No results for input(s): CKTOTAL, CKMB, CKMBINDEX, TROPONINI in the last  168 hours. BNP: Invalid input(s): POCBNP CBG: Recent Labs  Lab 07/11/17 0547  GLUCAP 85      Disposition and Follow-up: Discharge Instructions    Diet - low sodium heart healthy   Complete by:  As directed    Increase activity slowly   Complete by:  As directed        DISPOSITION: [place]. Schedule an appointment as soon as possible for a visit in 2 week(s).   Contact information: [address] [place] [address] [phone]           Time coordinating discharge:  39mins   Signed:   Estill Cotta M.D. [facility] 07/13/2017, 1:12 PM Pager: [phone]

## 2017-07-16 LAB — CULTURE, BLOOD (ROUTINE X 2)
CULTURE: NO GROWTH
Culture: NO GROWTH
SPECIAL REQUESTS: ADEQUATE
Special Requests: ADEQUATE

## 2017-07-28 NOTE — Progress Notes (Deleted)
    Postoperative Access Visit   History of Present Illness   Yazleen Molock is a 57 y.o. year old female who presents for postoperative follow-up for: left brachiocephalic arteriovenous fistula (Date: 07/04/17).  Fistula was pulsatile intraop.  The patient's wounds are *** healed.  The patient notes *** steal symptoms.  The patient is *** able to complete their activities of daily living.  The patient's current symptoms are: ***.   Physical Examination  ***There were no vitals filed for this visit.   left arm Incision is *** healed, skin feels ***, hand grip is ***/5, sensation in digits is *** intact, ***palpable thrill, bruit can *** be auscultated     Medical Decision Making   Kelsi Benham is a 57 y.o. year old female who presents s/p left brachiocephalic arteriovenous fistula   The patient's access is *** ready for use.  ***The patient's tunneled dialysis catheter can be removed after two successful cannulations and completed dialysis treatments.  Thank you for allowing [place] to participate in this patient's care.   Adele Barthel, MD, FACS [facility] Office: [phone] Pager: [phone]

## 2017-08-03 ENCOUNTER — Encounter: Payer: Medicare Other | Admitting: Vascular Surgery

## 2017-09-07 ENCOUNTER — Ambulatory Visit (INDEPENDENT_AMBULATORY_CARE_PROVIDER_SITE_OTHER): Payer: Self-pay | Admitting: Physician Assistant

## 2017-09-07 VITALS — BP 104/99 | HR 87 | Temp 98.1°F | Resp 16 | Ht 64.0 in | Wt 104.4 lb

## 2017-09-07 DIAGNOSIS — N186 End stage renal disease: Secondary | ICD-10-CM

## 2017-09-07 DIAGNOSIS — Z992 Dependence on renal dialysis: Secondary | ICD-10-CM

## 2017-09-07 NOTE — Progress Notes (Signed)
  POST OPERATIVE OFFICE NOTE    CC:  F/u for surgery  HPI:  This is a 57 y.o. female who is s/p left BC AVF and right tunneled catheter.   Pt previously had a R RC AVF that failed placed at [facility] in [place].  Post op exam revealed weak pulsatile flow in the fistula.  When she was discharged we planned on allowing the fistula time to progress.  She is here today for follow up exam to check the maturity of the fistula.   She denise pain, weakness or loss of sensation in the left hand.  No fever or chills.     No Known Allergies  Current Outpatient Medications  Medication Sig Dispense Refill  . calcitRIOL (ROCALTROL) 0.5 MCG capsule Take by mouth.    . cinacalcet (SENSIPAR) 30 MG tablet Take by mouth.    . felodipine (PLENDIL) 5 MG 24 hr tablet Take 2 tablets (10 mg total) by mouth daily. 60 tablet 4  . ferrous gluconate (FERGON) 324 MG tablet Take 324 mg by mouth daily.    Marland Kitchen gabapentin (NEURONTIN) 300 MG capsule Take 1 capsule (300 mg total) by mouth daily. 30 capsule 3  . hydrALAZINE (APRESOLINE) 25 MG tablet Take by mouth.    . hydroxychloroquine (PLAQUENIL) 200 MG tablet TAKE 1 TABLET BY MOUTH EVERY DAY    . labetalol (NORMODYNE) 100 MG tablet Take by mouth.    . lidocaine-prilocaine (EMLA) cream Apply 1 application topically as needed (topical anesthesia for hemodialysis if Gebauers and Lidocaine injection are ineffective.). 30 g 0  . multivitamin (RENA-VIT) TABS tablet Take 1 tablet by mouth at bedtime. 30 tablet 0  . mycophenolate (CELLCEPT) 500 MG tablet Take 2 tablets (1,000 mg total) by mouth 2 (two) times daily. 120 tablet 3  . nystatin (MYCOSTATIN) 100000 UNIT/ML suspension Take by mouth.    . ranitidine (ZANTAC) 150 MG tablet Take by mouth.    . sevelamer carbonate (RENVELA) 800 MG tablet Take 1 tablet (800 mg total) by mouth 3 (three) times daily with meals. 90 tablet 3  . sodium bicarbonate 650 MG tablet Take by mouth.     No current facility-administered  medications for this visit.      ROS:  See HPI  Physical Exam:  Vitals:   09/07/17 1258 09/07/17 1303  BP: (!) 149/104 (!) 104/99  Pulse: 87   Resp: 16   Temp: 98.1 F (36.7 C)   SpO2: 100%     Incision:  Well healed Extremities:  Grip 5/5, sensation intact, palpable radial Heart RRR Abdomen:  Soft NTTP  Assessment/Plan:  This is a 57 y.o. female who is s/p:  left BC AVF and right tunneled catheter.  Well maturing left radiocephalic fistula may be used as of May 28 th 2019.  Once successful catheter can be removed.   Roxy Horseman PA-C [facility] [phone]  Clinic MD:  Bridgett Larsson

## 2017-10-08 ENCOUNTER — Observation Stay (HOSPITAL_COMMUNITY)
Admission: EM | Admit: 2017-10-08 | Discharge: 2017-10-10 | Disposition: A | Discharge status: Home / Self Care | Payer: Medicare Other | Attending: Internal Medicine | Admitting: Internal Medicine

## 2017-10-08 ENCOUNTER — Encounter (HOSPITAL_COMMUNITY): Payer: Self-pay | Admitting: Internal Medicine

## 2017-10-08 DIAGNOSIS — F17210 Nicotine dependence, cigarettes, uncomplicated: Secondary | ICD-10-CM | POA: Diagnosis not present

## 2017-10-08 DIAGNOSIS — Z8611 Personal history of tuberculosis: Secondary | ICD-10-CM | POA: Insufficient documentation

## 2017-10-08 DIAGNOSIS — I77819 Aortic ectasia, unspecified site: Secondary | ICD-10-CM | POA: Diagnosis not present

## 2017-10-08 DIAGNOSIS — Z992 Dependence on renal dialysis: Secondary | ICD-10-CM | POA: Diagnosis not present

## 2017-10-08 DIAGNOSIS — D631 Anemia in chronic kidney disease: Secondary | ICD-10-CM | POA: Diagnosis not present

## 2017-10-08 DIAGNOSIS — I10 Essential (primary) hypertension: Secondary | ICD-10-CM | POA: Diagnosis present

## 2017-10-08 DIAGNOSIS — M329 Systemic lupus erythematosus, unspecified: Secondary | ICD-10-CM | POA: Diagnosis not present

## 2017-10-08 DIAGNOSIS — Z8673 Personal history of transient ischemic attack (TIA), and cerebral infarction without residual deficits: Secondary | ICD-10-CM | POA: Insufficient documentation

## 2017-10-08 DIAGNOSIS — I132 Hypertensive heart and chronic kidney disease with heart failure and with stage 5 chronic kidney disease, or end stage renal disease: Secondary | ICD-10-CM | POA: Diagnosis not present

## 2017-10-08 DIAGNOSIS — J449 Chronic obstructive pulmonary disease, unspecified: Secondary | ICD-10-CM | POA: Insufficient documentation

## 2017-10-08 DIAGNOSIS — I4581 Long QT syndrome: Secondary | ICD-10-CM | POA: Insufficient documentation

## 2017-10-08 DIAGNOSIS — N186 End stage renal disease: Secondary | ICD-10-CM | POA: Insufficient documentation

## 2017-10-08 DIAGNOSIS — R55 Syncope and collapse: Principal | ICD-10-CM | POA: Insufficient documentation

## 2017-10-08 DIAGNOSIS — Z8674 Personal history of sudden cardiac arrest: Secondary | ICD-10-CM | POA: Insufficient documentation

## 2017-10-08 DIAGNOSIS — F439 Reaction to severe stress, unspecified: Secondary | ICD-10-CM

## 2017-10-08 DIAGNOSIS — Z79899 Other long term (current) drug therapy: Secondary | ICD-10-CM | POA: Insufficient documentation

## 2017-10-08 DIAGNOSIS — Z8679 Personal history of other diseases of the circulatory system: Secondary | ICD-10-CM

## 2017-10-08 DIAGNOSIS — D649 Anemia, unspecified: Secondary | ICD-10-CM | POA: Diagnosis present

## 2017-10-08 LAB — I-STAT TROPONIN, ED: Troponin i, poc: 0.01 ng/mL (ref 0.00–0.08)

## 2017-10-08 LAB — I-STAT BETA HCG BLOOD, ED (MC, WL, AP ONLY): HCG, QUANTITATIVE: 6.7 m[IU]/mL — AB (ref <5)

## 2017-10-08 LAB — BASIC METABOLIC PANEL
Anion gap: 10 (ref 5–15)
BUN: 12 mg/dL (ref 6–20)
CHLORIDE: 96 mmol/L — AB (ref 101–111)
CO2: 31 mmol/L (ref 22–32)
CREATININE: 4.06 mg/dL — AB (ref 0.44–1.00)
Calcium: 8.8 mg/dL — ABNORMAL LOW (ref 8.9–10.3)
GFR calc Af Amer: 13 mL/min — ABNORMAL LOW (ref >60)
GFR calc non Af Amer: 11 mL/min — ABNORMAL LOW (ref >60)
GLUCOSE: 83 mg/dL (ref 65–99)
Potassium: 4.7 mmol/L (ref 3.5–5.1)
SODIUM: 137 mmol/L (ref 135–145)

## 2017-10-08 LAB — CBC WITH DIFFERENTIAL/PLATELET
Abs Immature Granulocytes: 0 10*3/uL (ref 0.0–0.1)
Basophils Absolute: 0 10*3/uL (ref 0.0–0.1)
Basophils Relative: 1 %
EOS ABS: 0.2 10*3/uL (ref 0.0–0.7)
EOS PCT: 3 %
HEMATOCRIT: 39.9 % (ref 36.0–46.0)
Hemoglobin: 11.9 g/dL — ABNORMAL LOW (ref 12.0–15.0)
Immature Granulocytes: 0 %
LYMPHS ABS: 1.5 10*3/uL (ref 0.7–4.0)
Lymphocytes Relative: 26 %
MCH: 29 pg (ref 26.0–34.0)
MCHC: 29.8 g/dL — AB (ref 30.0–36.0)
MCV: 97.3 fL (ref 78.0–100.0)
MONO ABS: 0.6 10*3/uL (ref 0.1–1.0)
MONOS PCT: 11 %
Neutro Abs: 3.4 10*3/uL (ref 1.7–7.7)
Neutrophils Relative %: 59 %
Platelets: 154 10*3/uL (ref 150–400)
RBC: 4.1 MIL/uL (ref 3.87–5.11)
RDW: 14.5 % (ref 11.5–15.5)
WBC: 5.7 10*3/uL (ref 4.0–10.5)

## 2017-10-08 LAB — CBG MONITORING, ED: Glucose-Capillary: 81 mg/dL (ref 65–99)

## 2017-10-08 MED ORDER — SENNOSIDES-DOCUSATE SODIUM 8.6-50 MG PO TABS: 1.0000 | ORAL_TABLET | Freq: Every evening | ORAL | Status: DC | PRN

## 2017-10-08 MED ORDER — SEVELAMER CARBONATE 800 MG PO TABS
800.0000 mg | ORAL_TABLET | Freq: Three times a day (TID) | ORAL | Status: DC
  Administered 2017-10-09 – 2017-10-10 (×6): 800 mg via ORAL
  Filled 2017-10-08 (×8): qty 1

## 2017-10-08 MED ORDER — LORAZEPAM 2 MG/ML IJ SOLN: 1.0000 mg | INTRAMUSCULAR | Status: DC | PRN

## 2017-10-08 MED ORDER — ACETAMINOPHEN 650 MG RE SUPP: 650.0000 mg | Freq: Four times a day (QID) | RECTAL | Status: DC | PRN

## 2017-10-08 MED ORDER — MYCOPHENOLATE MOFETIL 250 MG PO CAPS
1000.0000 mg | ORAL_CAPSULE | Freq: Two times a day (BID) | ORAL | Status: DC
Start: 2017-10-09 — End: 2017-10-10
  Administered 2017-10-09 – 2017-10-10 (×4): 1000 mg via ORAL
  Filled 2017-10-08 (×5): qty 4

## 2017-10-08 MED ORDER — NICOTINE 21 MG/24HR TD PT24
21.0000 mg | MEDICATED_PATCH | Freq: Every day | TRANSDERMAL | Status: DC
  Administered 2017-10-09 – 2017-10-10 (×2): 21 mg via TRANSDERMAL
  Filled 2017-10-08 (×2): qty 1

## 2017-10-08 MED ORDER — GABAPENTIN 300 MG PO CAPS
300.0000 mg | ORAL_CAPSULE | Freq: Every day | ORAL | Status: DC
  Administered 2017-10-09 – 2017-10-10 (×2): 300 mg via ORAL
  Filled 2017-10-08 (×2): qty 1

## 2017-10-08 MED ORDER — LIDOCAINE-PRILOCAINE 2.5-2.5 % EX CREA: 1.0000 "application " | TOPICAL_CREAM | CUTANEOUS | Status: DC | PRN

## 2017-10-08 MED ORDER — SODIUM CHLORIDE 0.9% FLUSH
3.0000 mL | Freq: Two times a day (BID) | INTRAVENOUS | Status: DC
  Administered 2017-10-09 – 2017-10-10 (×4): 3 mL via INTRAVENOUS

## 2017-10-08 MED ORDER — HYDRALAZINE HCL 20 MG/ML IJ SOLN
5.0000 mg | INTRAMUSCULAR | Status: DC | PRN
  Administered 2017-10-09: 5 mg via INTRAVENOUS
  Filled 2017-10-08: qty 1

## 2017-10-08 MED ORDER — HEPARIN SODIUM (PORCINE) 5000 UNIT/ML IJ SOLN
5000.0000 [IU] | Freq: Three times a day (TID) | INTRAMUSCULAR | Status: DC
  Administered 2017-10-09 – 2017-10-10 (×6): 5000 [IU] via SUBCUTANEOUS
  Filled 2017-10-08 (×6): qty 1

## 2017-10-08 MED ORDER — ZOLPIDEM TARTRATE 5 MG PO TABS: 5.0000 mg | ORAL_TABLET | Freq: Every evening | ORAL | Status: DC | PRN

## 2017-10-08 MED ORDER — RENA-VITE PO TABS
1.0000 | ORAL_TABLET | Freq: Every day | ORAL | Status: DC
  Administered 2017-10-09 (×2): 1 via ORAL
  Filled 2017-10-08 (×2): qty 1

## 2017-10-08 MED ORDER — FELODIPINE ER 10 MG PO TB24
10.0000 mg | ORAL_TABLET | Freq: Every day | ORAL | Status: DC
  Administered 2017-10-09 – 2017-10-10 (×2): 10 mg via ORAL
  Filled 2017-10-08 (×2): qty 1

## 2017-10-08 MED ORDER — HYDROXYZINE HCL 10 MG PO TABS
10.0000 mg | ORAL_TABLET | Freq: Three times a day (TID) | ORAL | Status: DC | PRN
  Administered 2017-10-09: 10 mg via ORAL
  Filled 2017-10-08: qty 1

## 2017-10-08 MED ORDER — ACETAMINOPHEN 325 MG PO TABS: 650.0000 mg | ORAL_TABLET | Freq: Four times a day (QID) | ORAL | Status: DC | PRN

## 2017-10-08 NOTE — ED Provider Notes (Signed)
[facility] EMERGENCY DEPARTMENT Provider Note   CSN: [id number] Arrival date & time: 10/08/17  1827     History   Chief Complaint Chief Complaint  Patient presents with  . Loss of Consciousness    HPI Kathleen [name] is a 57 y.o. female with a history of CKD (Tuesday, Thursday, Saturday), anemia, COPD, hypertension, TIA who presents emergency department today for presyncope.  Patient states that she had completed her dialysis earlier today.  She went home to eat lunch but notes that it was very hot in her friend's apartment.  She went outside to cool off but felt very weak and lightheaded, like she was going to pass out and her friend helped lie her to the ground.  She denies any loss of consciousness.  She denies any head trauma.  She reports that her symptoms resolved after lying on the ground for several minutes.  She is currently asymptomatic. She denies any preceding HA, vertigo, facial droop, visual changes, focal weakness, CP, SOB, abdominal pain, palpitations, diaphoresis. No preceding N/V, alcohol/drug use, or trauma. No new medications. Patient has not missed any dialysis sessions.  Her last dialysis session was today.  Patient's last echocardiogram on 2/21 with EF of 45-50% and G2 DD.  HPI  Past Medical History:  Diagnosis Date  . Anemia   . Arthritis   . Cardiac arrest ([place])   . Cellulitis   . Cerebral vasculitis   . COPD (chronic obstructive pulmonary disease) ([place])   . Elevated antinuclear antibody (ANA) level   . Endometriosis   . ESRD (end stage renal disease) on dialysis ([place])   . Gallstones   . Hypertension   . Lupus ([place])   . Migraine   . PONV (postoperative nausea and vomiting)   . Seizures ([place])   . Sepsis ([place])   . Suicide attempt ([place])   . TB (tuberculosis)   . Transient ischemic attack (TIA)   . Vasculitis of skin     Patient Active Problem List   Diagnosis Date Noted  . Acute encephalopathy 07/11/2017  . ESRD (end stage renal  disease) ([place]) 07/11/2017  . COPD (chronic obstructive pulmonary disease) ([place])   . Anemia   . Benign essential HTN 07/01/2017  . Stage 5 chronic kidney disease not on chronic dialysis ([place])   . Hypertensive crisis 06/29/2017  . Hypertensive emergency   . Lupus erythematosus   . Malnutrition of moderate degree 07/14/2016  . Lupus ([place]) 06/28/2016  . Hypertension 06/28/2016  . Kidney disease 06/28/2016  . Symptomatic anemia 06/28/2016  . Abdominal pain 06/28/2016  . Current non-adherence to medical treatment 06/28/2016  . Hypertensive urgency 06/28/2016  . SLE exacerbation ([place]) 06/28/2016  . Renal insufficiency     Past Surgical History:  Procedure Laterality Date  . AV FISTULA PLACEMENT Left 07/04/2017   Procedure: ARTERIOVENOUS (AV) FISTULA CREATION;  Surgeon: Conrad [place]bra[place], MD;  Location: [facility];  Service: Vascular;  Laterality: Left;  . CESAREAN SECTION    . ESOPHAGOGASTRODUODENOSCOPY N/A 07/15/2016   Procedure: ESOPHAGOGASTRODUODENOSCOPY (EGD);  Surgeon: Laurence Spates, MD;  Location: [facility] ENDOSCOPY;  Service: Endoscopy;  Laterality: N/A;  . INSERTION OF DIALYSIS CATHETER Right 07/04/2017   Procedure: INSERTION OF TUNNELED  DIALYSIS CATHETER - RIGHT INTERNAL JUGULAR PLACEMENT;  Surgeon: Conrad Riner, MD;  Location: [facility];  Service: Vascular;  Laterality: Right;  . TUBAL LIGATION       OB History   None      Home Medications  Prior to Admission medications   Medication Sig Start Date End Date Taking? Authorizing Provider  calcitRIOL (ROCALTROL) 0.5 MCG capsule Take by mouth. 07/07/17   [provider]  cinacalcet (SENSIPAR) 30 MG tablet Take by mouth. 07/08/17   [provider]  felodipine (PLENDIL) 5 MG 24 hr tablet Take 2 tablets (10 mg total) by mouth daily. 07/13/17   Rai, Ripudeep Raliegh Ip, MD  ferrous gluconate (FERGON) 324 MG tablet Take 324 mg by mouth daily. 04/14/17   [provider]  gabapentin (NEURONTIN) 300 MG capsule Take 1 capsule (300 mg  total) by mouth daily. 07/13/17   Rai, Ripudeep Raliegh Ip, MD  hydrALAZINE (APRESOLINE) 25 MG tablet Take by mouth. 07/13/17   [provider]  hydroxychloroquine (PLAQUENIL) 200 MG tablet TAKE 1 TABLET BY MOUTH EVERY DAY 08/11/17   [provider]  labetalol (NORMODYNE) 100 MG tablet Take by mouth. 07/25/17   [provider]  lidocaine-prilocaine (EMLA) cream Apply 1 application topically as needed (topical anesthesia for hemodialysis if Gebauers and Lidocaine injection are ineffective.). 07/07/17   Georgette Shell, MD  multivitamin (RENA-VIT) TABS tablet Take 1 tablet by mouth at bedtime. 07/07/17   Georgette Shell, MD  mycophenolate (CELLCEPT) 500 MG tablet Take 2 tablets (1,000 mg total) by mouth 2 (two) times daily. 07/13/17   Rai, Ripudeep K, MD  nystatin (MYCOSTATIN) 100000 UNIT/ML suspension Take by mouth. 10/13/14   [provider]  ranitidine (ZANTAC) 150 MG tablet Take by mouth. 07/25/17   [provider]  sevelamer carbonate (RENVELA) 800 MG tablet Take 1 tablet (800 mg total) by mouth 3 (three) times daily with meals. 07/13/17   Rai, Vernelle Emerald, MD  sodium bicarbonate 650 MG tablet Take by mouth. 07/07/17   [provider]    Family History No family history on file.  Social History Social History   Tobacco Use  . Smoking status: Current Some Day Smoker    Packs/day: 0.50    Types: Cigarettes  . Smokeless tobacco: Never Used  Substance Use Topics  . Alcohol use: No  . Drug use: No     Allergies   Patient has no known allergies.   Review of Systems Review of Systems  All other systems reviewed and are negative.    Physical Exam Updated Vital Signs BP (!) 140/101   Pulse 71   Temp 97.8 F (36.6 C) (Oral)   Resp 14   SpO2 99%   Physical Exam  Constitutional: She appears well-developed and well-nourished.  HENT:  Head: Normocephalic and atraumatic.  Right Ear: External ear normal.  Left Ear: External ear normal.    Nose: Nose normal.  Mouth/Throat: Uvula is midline, oropharynx is clear and moist and mucous membranes are normal. No tonsillar exudate.  Eyes: Pupils are equal, round, and reactive to light. Right eye exhibits no discharge. Left eye exhibits no discharge. No scleral icterus.  Neck: Trachea normal. Neck supple. No spinous process tenderness present. No neck rigidity. Normal range of motion present.  Cardiovascular: Normal rate, regular rhythm and intact distal pulses.  No murmur heard. Pulses:      Radial pulses are 2+ on the right side, and 2+ on the left side.       Dorsalis pedis pulses are 2+ on the right side, and 2+ on the left side.       Posterior tibial pulses are 2+ on the right side, and 2+ on the left side.  No lower extremity swelling or edema.  Calves symmetric in size bilaterally.  Pulmonary/Chest: Effort normal and breath sounds normal. She exhibits no tenderness.  Abdominal: Soft. Bowel sounds are normal. There is no tenderness. There is no rebound and no guarding.  Musculoskeletal: She exhibits no edema.  Good thrill of left upper extremity   Lymphadenopathy:    She has no cervical adenopathy.  Neurological: She is alert.  Speech clear. Follows commands. No facial droop. PERRLA. EOMI. Normal peripheral fields. CN III-XII intact.  Grossly moves all extremities 4 without ataxia. Coordination intact. Able and appropriate strength for age to upper and lower extremities bilaterally including grip strength & plantar flexion/dorsiflexion. Sensation to light touch intact bilaterally for upper and lower. Patellar deep tendon reflex 2+ and equal bilaterally. Normal finger to nose. No pronator drift.  Skin: Skin is warm and dry. No rash noted. She is not diaphoretic.  Psychiatric: She has a normal mood and affect.  Nursing note and vitals reviewed.    ED Treatments / Results  Labs (all labs ordered are listed, but only abnormal results are displayed) Labs Reviewed  BASIC  METABOLIC PANEL - Abnormal; Notable for the following components:      Result Value   Chloride 96 (*)    Creatinine, Ser 4.06 (*)    Calcium 8.8 (*)    GFR calc non Af Amer 11 (*)    GFR calc Af Amer 13 (*)    All other components within normal limits  CBC WITH DIFFERENTIAL/PLATELET - Abnormal; Notable for the following components:   Hemoglobin 11.9 (*)    MCHC 29.8 (*)    All other components within normal limits  I-STAT BETA HCG BLOOD, ED (MC, [facility], AP ONLY) - Abnormal; Notable for the following components:   I-stat hCG, quantitative 6.7 (*)    All other components within normal limits  RAPID URINE DRUG SCREEN, HOSP PERFORMED  BASIC METABOLIC PANEL  CBC  I-STAT TROPONIN, ED  CBG MONITORING, ED  I-STAT BETA HCG BLOOD, ED ([facility], [facility], [facility] ONLY)    EKG EKG Interpretation  Date/Time:  Saturday October 08 2017 18:55:37 EDT Ventricular Rate:  80 PR Interval:    QRS Duration: 98 QT Interval:  446 QTC Calculation: 515 R Axis:   132 Text Interpretation:  Sinus rhythm Right axis deviation Consider left ventricular hypertrophy Prolonged QT interval no wpw, prolonged qt or brugada Confirmed by Deno Etienne [phone]) on 10/08/2017 7:44:55 PM   Radiology No results found.  Procedures Procedures (including critical care time)  Medications Ordered in ED Medications  felodipine (PLENDIL) 24 hr tablet 10 mg (has no administration in time range)  gabapentin (NEURONTIN) capsule 300 mg (has no administration in time range)  lidocaine-prilocaine (EMLA) cream 1 application (has no administration in time range)  multivitamin (RENA-VIT) tablet 1 tablet (has no administration in time range)  mycophenolate (CELLCEPT) tablet 1,000 mg (has no administration in time range)  sevelamer carbonate (RENVELA) tablet 800 mg (has no administration in time range)  nicotine (NICODERM CQ - dosed in mg/24 hours) patch 21 mg (has no administration in time range)  LORazepam (ATIVAN) injection 1 mg (has no administration in  time range)  sodium chloride flush (NS) 0.9 % injection 3 mL (has no administration in time range)  heparin injection 5,000 Units (has no administration in time range)  acetaminophen (TYLENOL) tablet 650 mg (has no administration in time range)    Or  acetaminophen (TYLENOL) suppository 650 mg (has no administration in time range)  senna-docusate (Senokot-S) tablet 1 tablet (has  no administration in time range)  hydrALAZINE (APRESOLINE) injection 5 mg (has no administration in time range)  zolpidem (AMBIEN) tablet 5 mg (has no administration in time range)  hydrOXYzine (ATARAX/VISTARIL) tablet 10 mg (has no administration in time range)     Initial Impression / Assessment and Plan / ED Course  I have reviewed the triage vital signs and the nursing notes.  Pertinent labs & imaging results that were available during my care of the patient were reviewed by me and considered in my medical decision making (see chart for details).     57 y.o. female with a presyncopal/syncopal episode earlier this evening.  Patient reports that she was at friend's house where she felt hot, went outside to cool off but felt very weak and lightheaded and had to be assisted down to the ground by a friend.  She denies loss of consciousness but EMS reports loss of consciousness.  No injury, head trauma reported.  She is currently asymptomatic.  No preceding headache, chest pain, shortness of breath, abdominal pain.  Patient does have a history of heart failure. Patient's last echocardiogram on 2/21 with EF of 45-50% and G2 DD.  EKG and lab work is overall reassuring near patient's baseline.  No significant electrolyte derangements.  Patient did receive dialysis today.  Orthostatic vital signs unremarkable.  Patient's hCG is mildly elevated at 6.7.  Patient is postmenopausal and has not had sexual activity in greater than 1 year she reports.  Given patient has a history of heart failure will admit for further work-up as she  is high risk.  I appreciate Dr. Blaine Hamper for admitting the patient to the hospitalist service.  Patient agreement with plan and appears safe for admission  Patient case seen and discussed with Dr. [name] who is in agreement with plan.   Orthostatic VS for the past 24 hrs:  BP- Lying Pulse- Lying BP- Sitting Pulse- Sitting BP- Standing at 0 minutes Pulse- Standing at 0 minutes  10/08/17 2031 (!) 112/98 106 (!) 135/109 109 (!) 124/102 88    Final Clinical Impressions(s) / ED Diagnoses   Final diagnoses:  Near syncope  History of CHF (congestive heart failure)    ED Discharge Orders    None       Jillyn Ledger, PA-C 10/09/17 0032    Deno Etienne, DO 10/09/17 1500

## 2017-10-08 NOTE — ED Triage Notes (Signed)
Pt to ED via [facility] after reported having a syncopal episode while outside.  Pt st's she was eating and became hot and went outside to cool off.  Once she got outside she had a syncopal episode but did not fall.  Pt was helped to the ground by friends

## 2017-10-08 NOTE — H&P (Addendum)
History and Physical    Kathleen [name] QVZ:[id number] DOB: [birth year] DOA: 10/08/2017  Referring MD/NP/PA:   PCP: Angela Burke, NP   Patient coming from:  The patient is coming from home.  At baseline, pt is independent for most of ADL.   Chief Complaint: syncope  HPI: Kathleen [name] is a 57 y.o. female with medical history significant of ESRD (TTS), anemia, COPD, hypertension, TIA, seizure, lupus, who presents with syncope.   Patient states that she had dialysis earlier today. She was with her friend at [facility] this PM, and suddenly felt hot. She went outside to cool off, but felt very weak and lightheaded, then passed out for a few seconds. Her friend helped her to the ground, no injury. She felt normal after a few minutes.  Patient denies unilateral weakness, numbness or tingling his extremities.  No facial droop, slurred speech.  Denies headache, chest pain, shortness of breath.  no fever, chills.  she states that she has mild intermittent chronic diarrhea, which has not changed today.  No nausea vomiting or abdominal pain.  She does not make urine anymore.  No symptoms of UTI.   ED Course: pt was found to have WBC 5.7, troponin negative, beta-hCG 6.7 (patient is postmenopausal), potassium 4.7, bicarbonate 31, creatinine 4.06, BUN 12, temperature normal, no tachycardia, oxygen saturation 94% on room air.  Patient is placed on telemetry bed for observation.  Review of Systems:   General: no fevers, chills, no body weight gain, has fatigue HEENT: no blurry vision, hearing changes or sore throat Respiratory: no dyspnea, coughing, wheezing CV: no chest pain, no palpitations GI: no nausea, vomiting, abdominal pain, diarrhea, constipation GU: no dysuria, burning on urination, increased urinary frequency, hematuria  Ext: no leg edema Neuro: no unilateral weakness, numbness, or tingling, no vision change or hearing loss. Had syncope. Skin: no rash, no skin tear. MSK: No muscle spasm,  no deformity, no limitation of range of movement in spin Heme: No easy bruising.  Travel history: No recent long distant travel.  Allergy: No Known Allergies  Past Medical History:  Diagnosis Date  . Anemia   . Arthritis   . Cardiac arrest ([place])   . Cellulitis   . Cerebral vasculitis   . COPD (chronic obstructive pulmonary disease) ([place])   . Elevated antinuclear antibody (ANA) level   . Endometriosis   . ESRD (end stage renal disease) on dialysis ([place])   . Gallstones   . Hypertension   . Lupus ([place])   . Migraine   . PONV (postoperative nausea and vomiting)   . Seizures ([place])   . Sepsis ([place])   . Suicide attempt ([place])   . TB (tuberculosis)   . Transient ischemic attack (TIA)   . Vasculitis of skin     Past Surgical History:  Procedure Laterality Date  . AV FISTULA PLACEMENT Left 07/04/2017   Procedure: ARTERIOVENOUS (AV) FISTULA CREATION;  Surgeon: Conrad [place], MD;  Location: [facility];  Service: Vascular;  Laterality: Left;  . CESAREAN SECTION    . ESOPHAGOGASTRODUODENOSCOPY N/A 07/15/2016   Procedure: ESOPHAGOGASTRODUODENOSCOPY (EGD);  Surgeon: Laurence Spates, MD;  Location[facility] ENDOSCOPY;  Service: Endoscopy;  Laterality: N/A;  . INSERTION OF DIALYSIS CATHETER Right 07/04/2017   Procedure: INSERTION OF TUNNELED  DIALYSIS CATHETER - RIGHT INTERNAL JUGULAR PLACEMENT;  Surgeon: Conrad [place], MD;  Location: [facility];  Service: Vascular;  Laterality: Right;  . TUBAL LIGATION      Social History:  reports that she has  been smoking cigarettes.  She has been smoking about 0.50 packs per day. She has never used smokeless tobacco. She reports that she does not drink alcohol or use drugs.  Family History:  Family History  Problem Relation Age of Onset  . Ovarian cancer Mother   . Breast cancer Sister   . Throat cancer Brother      Prior to Admission medications   Medication Sig Start Date End Date Taking? Authorizing Provider  felodipine (PLENDIL) 5 MG 24 hr tablet Take 2 tablets  (10 mg total) by mouth daily. 07/13/17  Yes Rai, Ripudeep K, MD  gabapentin (NEURONTIN) 300 MG capsule Take 1 capsule (300 mg total) by mouth daily. 07/13/17  Yes Rai, Ripudeep K, MD  lidocaine-prilocaine (EMLA) cream Apply 1 application topically as needed (topical anesthesia for hemodialysis if Gebauers and Lidocaine injection are ineffective.). 07/07/17  Yes Georgette Shell, MD  multivitamin (RENA-VIT) TABS tablet Take 1 tablet by mouth at bedtime. 07/07/17  Yes Georgette Shell, MD  mycophenolate (CELLCEPT) 500 MG tablet Take 2 tablets (1,000 mg total) by mouth 2 (two) times daily. 07/13/17  Yes Rai, Ripudeep K, MD  sevelamer carbonate (RENVELA) 800 MG tablet Take 1 tablet (800 mg total) by mouth 3 (three) times daily with meals. 07/13/17  Yes Mendel Corning, MD    Physical Exam: Vitals:   10/08/17 2200 10/08/17 2300 10/09/17 0000 10/09/17 0100  BP: (!) 132/103 (!) 136/99 (!) 140/101 (!) 151/103  Pulse: 78 77 71 74  Resp: (!) 21 17 14 13   Temp:      TempSrc:      SpO2: 100% 99% 99% 97%   General: Not in acute distress HEENT:       Eyes: PERRL, EOMI, no scleral icterus.       ENT: No discharge from the ears and nose, no pharynx injection, no tonsillar enlargement.        Neck: No JVD, no bruit, no mass felt. Heme: No neck lymph node enlargement. Cardiac: S1/S2, RRR, No murmurs, No gallops or rubs. Respiratory: No rales, wheezing, rhonchi or rubs. GI: Soft, nondistended, nontender, no rebound pain, no organomegaly, BS present. GU: No hematuria Ext: No pitting leg edema bilaterally. 2+DP/PT pulse bilaterally. Has AVF in left arm Musculoskeletal: No joint deformities, No joint redness or warmth, no limitation of ROM in spin. Skin: No rashes.  Neuro: Alert, oriented X3, cranial nerves II-XII grossly intact, moves all extremities normally. Muscle strength 5/5 in all extremities, sensation to light touch intact. Brachial reflex 2+ bilaterally. Negative Babinski's sign. Normal finger to  nose test. Psych: Patient is not psychotic, no suicidal or hemocidal ideation.  Labs on Admission: I have personally reviewed following labs and imaging studies  CBC: Recent Labs  Lab 10/08/17 2040  WBC 5.7  NEUTROABS 3.4  HGB 11.9*  HCT 39.9  MCV 97.3  PLT 324   Basic Metabolic Panel: Recent Labs  Lab 10/08/17 2040  NA 137  K 4.7  CL 96*  CO2 31  GLUCOSE 83  BUN 12  CREATININE 4.06*  CALCIUM 8.8*   GFR: CrCl cannot be calculated (Unknown ideal weight.). Liver Function Tests: No results for input(s): AST, ALT, ALKPHOS, BILITOT, PROT, ALBUMIN in the last 168 hours. No results for input(s): LIPASE, AMYLASE in the last 168 hours. No results for input(s): AMMONIA in the last 168 hours. Coagulation Profile: No results for input(s): INR, PROTIME in the last 168 hours. Cardiac Enzymes: No results for input(s): CKTOTAL, CKMB, CKMBINDEX, TROPONINI  in the last 168 hours. BNP (last 3 results) No results for input(s): PROBNP in the last 8760 hours. HbA1C: No results for input(s): HGBA1C in the last 72 hours. CBG: Recent Labs  Lab 10/08/17 2146  GLUCAP 81   Lipid Profile: No results for input(s): CHOL, HDL, LDLCALC, TRIG, CHOLHDL, LDLDIRECT in the last 72 hours. Thyroid Function Tests: No results for input(s): TSH, T4TOTAL, FREET4, T3FREE, THYROIDAB in the last 72 hours. Anemia Panel: No results for input(s): VITAMINB12, FOLATE, FERRITIN, TIBC, IRON, RETICCTPCT in the last 72 hours. Urine analysis:    Component Value Date/Time   COLORURINE STRAW (A) 06/29/2017 1428   APPEARANCEUR CLEAR 06/29/2017 1428   LABSPEC 1.009 06/29/2017 1428   PHURINE 6.0 06/29/2017 1428   GLUCOSEU NEGATIVE 06/29/2017 1428   HGBUR SMALL (A) 06/29/2017 1428   BILIRUBINUR NEGATIVE 06/29/2017 1428   KETONESUR NEGATIVE 06/29/2017 1428   PROTEINUR 100 (A) 06/29/2017 1428   NITRITE NEGATIVE 06/29/2017 1428   LEUKOCYTESUR NEGATIVE 06/29/2017 1428   Sepsis  Labs: @LABRCNTIP (procalcitonin:4,lacticidven:4) )No results found for this or any previous visit (from the past 240 hour(s)).   Radiological Exams on Admission: No results found.   EKG: Independently reviewed.  Sinus rhythm, QTC 515, RAD, LVH  Assessment/Plan Principal Problem:   Syncope Active Problems:   Lupus ([place])   Hypertension   ESRD (end stage renal disease) ([facility])   Anemia   Syncope: Etiology is not clear. The differential diagnosis is broad, including vasovagal syncope, seizure, TIA, arrhythmia, drug abuse, orthostatic status. Seizure is no her medical problem list, but the patient denies history of seizures.  She is not taking any medications for seizure.   - Place on tele bed for obs - Orthostatic vital signs  - MRI-brain - EEG - 2d echo - Neuro checks  - Seizure precaution - When necessary Ativan for seizure - PT/OT eval and treat  Lupus ([place]): on cellcept -continue cellcept  Addendum: per pharmacist, pt is also on Plaquenil 200 mg daily -will add plaquenil  ESRD (end stage renal disease) on dialysis (TTS): potassium 4.7, bicarbonate 31, creatinine 4.06, BUN 12, -place call renal for HD if pt stays to Tuesday  HTN:  -Continue home medications: Felodipine -IV hydralazine prn  Anemia due to ESRD: HGb stable, 11.9 -Follow-up with CBC       DVT ppx: SQ Heparin       Code Status: Full code Family Communication: None at bed side.     Disposition Plan:  Anticipate discharge back to previous home environment Consults called:  none Admission status: Obs / tele    Date of Service 10/09/2017    Ivor Costa [facility] Pager [phone]  If 7PM-7AM, please contact night-coverage [email or web address] Password [facility] 10/09/2017, 2:46 AM

## 2017-10-08 NOTE — ED Notes (Signed)
Dr.NIU at bedside-Monique,RN  

## 2017-10-08 NOTE — ED Notes (Addendum)
Patient is Anuric-Monique,RN

## 2017-10-08 NOTE — ED Notes (Signed)
Pt CBG was 81, notified RN

## 2017-10-09 ENCOUNTER — Other Ambulatory Visit: Payer: Self-pay

## 2017-10-09 ENCOUNTER — Observation Stay (HOSPITAL_COMMUNITY): Payer: Medicare Other

## 2017-10-09 ENCOUNTER — Other Ambulatory Visit (HOSPITAL_COMMUNITY): Payer: Medicare Other

## 2017-10-09 ENCOUNTER — Encounter (HOSPITAL_COMMUNITY): Payer: Self-pay | Admitting: *Deleted

## 2017-10-09 DIAGNOSIS — R55 Syncope and collapse: Secondary | ICD-10-CM | POA: Diagnosis not present

## 2017-10-09 DIAGNOSIS — N186 End stage renal disease: Secondary | ICD-10-CM | POA: Diagnosis not present

## 2017-10-09 DIAGNOSIS — I10 Essential (primary) hypertension: Secondary | ICD-10-CM | POA: Diagnosis not present

## 2017-10-09 LAB — BASIC METABOLIC PANEL
Anion gap: 13 (ref 5–15)
BUN: 17 mg/dL (ref 6–20)
CO2: 30 mmol/L (ref 22–32)
CREATININE: 4.95 mg/dL — AB (ref 0.44–1.00)
Calcium: 9.4 mg/dL (ref 8.9–10.3)
Chloride: 94 mmol/L — ABNORMAL LOW (ref 101–111)
GFR calc Af Amer: 10 mL/min — ABNORMAL LOW (ref >60)
GFR, EST NON AFRICAN AMERICAN: 9 mL/min — AB (ref >60)
GLUCOSE: 90 mg/dL (ref 65–99)
POTASSIUM: 4.1 mmol/L (ref 3.5–5.1)
SODIUM: 137 mmol/L (ref 135–145)

## 2017-10-09 LAB — MRSA PCR SCREENING: MRSA BY PCR: NEGATIVE

## 2017-10-09 LAB — CBC
HEMATOCRIT: 41.1 % (ref 36.0–46.0)
Hemoglobin: 12.1 g/dL (ref 12.0–15.0)
MCH: 28.7 pg (ref 26.0–34.0)
MCHC: 29.4 g/dL — AB (ref 30.0–36.0)
MCV: 97.6 fL (ref 78.0–100.0)
PLATELETS: 185 10*3/uL (ref 150–400)
RBC: 4.21 MIL/uL (ref 3.87–5.11)
RDW: 14.6 % (ref 11.5–15.5)
WBC: 6 10*3/uL (ref 4.0–10.5)

## 2017-10-09 LAB — CBG MONITORING, ED: Glucose-Capillary: 80 mg/dL (ref 65–99)

## 2017-10-09 MED ORDER — HYDROXYCHLOROQUINE SULFATE 200 MG PO TABS
200.0000 mg | ORAL_TABLET | Freq: Every day | ORAL | Status: DC
  Administered 2017-10-09 – 2017-10-10 (×2): 200 mg via ORAL
  Filled 2017-10-09 (×2): qty 1

## 2017-10-09 NOTE — ED Notes (Signed)
Pt off monitor pacing up and down hall at this time.

## 2017-10-09 NOTE — ED Notes (Signed)
Pt back in bed on the monitor; VSS. States she just urinated a little earlier and is unable to provide a urine sample at this time. Knows to give sample when able.

## 2017-10-09 NOTE — Progress Notes (Signed)
Physical Therapy Evaluation  Clinical Impression: Patient evaluated by Physical Therapy with no further acute PT needs identified. All education has been completed and the patient has no further questions. Demonstrates high level of functional ability including dynamic balance and gait tasks without loss of balance. No dizziness, VSS throughout session. See below for any follow-up Physical Therapy or equipment needs. PT is signing off. Thank you for this referral.    10/09/17 1000  PT Visit Information  Last PT Received On 10/09/17  Assistance Needed +1  History of Present Illness Kathleen [name] is a 57 y.o. female with medical history significant of ESRD (TTS), anemia, COPD, hypertension, TIA, seizure, lupus, who presents with syncope.   Precautions  Precautions None  Restrictions  Weight Bearing Restrictions No  Home Living  Family/patient expects to be discharged to: Private residence  Living Arrangements Spouse/significant other  Available Help at Discharge Family;Available PRN/intermittently  Type of Home Apartment  Home Access Level entry  Home Layout One level  [place] - 2 wheels;Cane - single point  Prior Function  Level of Independence Independent with assistive device(s)  Comments States she rarely uses any device for mobility. Can do all over ADLs unless she feels unwell from dialysis, at that time she will have friends assist her with any needs.  Communication  Communication No difficulties  Pain Assessment  Pain Assessment No/denies pain  Cognition  Arousal/Alertness Awake/alert  Behavior During Therapy WFL for tasks assessed/performed  Overall Cognitive Status Within Functional Limits for tasks assessed  Upper Extremity Assessment  Upper Extremity Assessment Defer to OT evaluation  Lower Extremity Assessment  Lower Extremity Assessment Overall WFL for tasks assessed  Bed Mobility  Overal bed mobility Independent  Transfers  Overall transfer level  Independent  Ambulation/Gait  Ambulation/Gait assistance Independent  Ambulation Distance (Feet) 150 Feet  Assistive device None  Gait Pattern/deviations WFL(Within Functional Limits)  Gait velocity interpretation >4.37 ft/sec, indicative of normal walking speed  Balance  Overall balance assessment Independent  Standardized Balance Assessment  Standardized Balance Assessment  Dynamic Gait Index  Dynamic Gait Index  Level Surface 3  Change in Gait Speed 3  Gait with Horizontal Head Turns 3  Gait with Vertical Head Turns 3  Gait and Pivot Turn 3  Step Over Obstacle 2  Step Around Obstacles 3  Steps 3  Total Score 23  General Comments  General comments (skin integrity, edema, etc.) VSS throughout eval  PT - End of Session  Activity Tolerance Patient tolerated treatment well  Patient left in bed;with call bell/phone within reach  Nurse Communication Mobility status  PT Assessment  PT Recommendation/Assessment Patent does not need any further PT services  PT Visit Diagnosis History of falling (Z91.81)  No Skilled PT All education completed;Patient at baseline level of functioning;Patient will have necessary level of assist by caregiver at discharge  AM-PAC PT "6 Clicks" Daily Activity Outcome Measure  Difficulty turning over in bed (including adjusting bedclothes, sheets and blankets)? 4  Difficulty moving from lying on back to sitting on the side of the bed?  4  Difficulty sitting down on and standing up from a chair with arms (e.g., wheelchair, bedside commode, etc,.)? 4  Help needed moving to and from a bed to chair (including a wheelchair)? 4  Help needed walking in hospital room? 4  Help needed climbing 3-5 steps with a railing?  4  6 Click Score 24  Mobility G Code  CH  PT Recommendation  Follow Up Recommendations No  PT follow up  PT equipment None recommended by PT  Acute Rehab PT Goals  Patient Stated Goal None stated  PT Goal Formulation All assessment and education  complete, DC therapy  PT Time Calculation  PT Start Time (ACUTE ONLY) 1038  PT Stop Time (ACUTE ONLY) 1049  PT Time Calculation (min) (ACUTE ONLY) 11 min  PT General Charges  $$ ACUTE PT VISIT 1 Visit  PT Evaluation  $PT Eval Low Complexity 1 Low    Kathleen [name], PT, DPT

## 2017-10-09 NOTE — Progress Notes (Signed)
OT Cancellation Note  Patient Details Name: Kathleen [name] MRN: [id number] DOB: 1960-11-30   Cancelled Treatment:    Reason Eval/Treat Not Completed: OT screened, no needs identified, will sign off  Jaci Carrel 10/09/2017, 4:39 PM  Hulda Humphrey OTR/L [phone]

## 2017-10-09 NOTE — ED Notes (Signed)
Patient transported to MRI 

## 2017-10-09 NOTE — Progress Notes (Signed)
Patient ID: Kathleen [name], female   DOB: 13-Feb-1961, 57 y.o.   MRN: [id number]  PROGRESS NOTE    Kathleen [name]  XLK:[id number] DOB: Mar 15, 1961 DOA: 10/08/2017 PCP: Kathleen Priest, NP   Outpatient Specialists: [facility]   Brief Narrative:  Kathleen [name] is a 57 y.o. female with medical history significant of ESRD (TTS), anemia, COPD, hypertension, TIA, seizure, lupus, who presents with syncope.   Patient states that she had dialysis earlier today. She was with her friend at [facility] this PM, and suddenly felt hot. She went outside to cool off, but felt very weak andlightheaded,then passed out for a few seconds. Her friend helped her to the ground, no injury. She felt normal after a few minutes.  Patient denies unilateral weakness, numbness or tingling his extremities.  No facial droop, slurred speech.  Denies headache, chest pain, shortness of breath.  no fever, chills.  she states that she has mild intermittent chronic diarrhea, which has not changed today.  No nausea vomiting or abdominal pain.  She does not make urine anymore.  No symptoms of UTI.      Assessment & Plan:   Principal Problem:   Syncope Active Problems:   Lupus (HCC)   Hypertension   ESRD (end stage renal disease) (HCC)   Anemia   #1 syncope: Patient appears to have had recurrent episodes in the past.  She has issues with her blood pressure with medications being changed frequently.  It appears she is having similar episode today.  She is also high risk for CVA.  Patient had hemodialysis yesterday.  She was taking blood pressure medicine every other day in the past.  We'll check orthostatics.  Nephrology consultation, check MRI of the brain to rule out CVA.  PT and OT evaluation. Follow results of echo and EEG.  Rule out seizure  #2 hypertension: Blood pressure is stable at this point.  No obvious drop in her blood pressure.  We'll continue home regimen unless changed by nephrology.  #3  end-stage renal disease: Hemodialysis on Tuesdays Thursdays and Saturdays.  She was dialyzed yesterday.  Nephrology to consult  #4 history of lupus: Stable at this point.  Continue home regimen.  #5 anemia of chronic disease: Follow H&H.   DVT prophylaxis: heparin Code Status:   Full Family Communication:  None available Disposition Plan: Home   Consultants:   Nephrology, [facility]  Procedures:   MRI, Carotid Dopplers, Echo pending   Antimicrobials: None    Subjective: Patient is doing better now sitting on the side of the bed.  No more syncopal episodes and blood pressure appears stable.  Objective: Vitals:   10/09/17 0253 10/09/17 0441 10/09/17 0500 10/09/17 0600  BP: (!) 164/109 (!) 177/110 (!) 143/103 (!) 110/91  Pulse: 75   84  Resp: 15  16 14   Temp:      TempSrc:      SpO2: 97%   100%    Intake/Output Summary (Last 24 hours) at 10/09/2017 0718 Last data filed at 10/09/2017 0254 Gross per 24 hour  Intake 3 ml  Output -  Net 3 ml   There were no vitals filed for this visit.  Examination:  General exam: Appears calm and comfortable  Respiratory system: Clear to auscultation. Respiratory effort normal. Cardiovascular system: S1 & S2 heard, RRR. No JVD, murmurs, rubs, gallops or clicks. No pedal edema. Gastrointestinal system: Abdomen is nondistended, soft and nontender. No organomegaly or masses felt. Normal bowel sounds heard. Central nervous system:  Alert and oriented. No focal neurological deficits. Extremities: Symmetric 5 x 5 power. Skin: No rashes, lesions or ulcers Psychiatry: Judgement and insight appear normal. Mood & affect appropriate.     Data Reviewed: I have personally reviewed following labs and imaging studies  CBC: Recent Labs  Lab 10/08/17 2040 10/09/17 0442  WBC 5.7 6.0  NEUTROABS 3.4  --   HGB 11.9* 12.1  HCT 39.9 41.1  MCV 97.3 97.6  PLT 154 185   Basic Metabolic Panel: Recent Labs  Lab 10/08/17 2040 10/09/17 0442  NA 137  137  K 4.7 4.1  CL 96* 94*  CO2 31 30  GLUCOSE 83 90  BUN 12 17  CREATININE 4.06* 4.95*  CALCIUM 8.8* 9.4   GFR: CrCl cannot be calculated (Unknown ideal weight.). Liver Function Tests: No results for input(s): AST, ALT, ALKPHOS, BILITOT, PROT, ALBUMIN in the last 168 hours. No results for input(s): LIPASE, AMYLASE in the last 168 hours. No results for input(s): AMMONIA in the last 168 hours. Coagulation Profile: No results for input(s): INR, PROTIME in the last 168 hours. Cardiac Enzymes: No results for input(s): CKTOTAL, CKMB, CKMBINDEX, TROPONINI in the last 168 hours. BNP (last 3 results) No results for input(s): PROBNP in the last 8760 hours. HbA1C: No results for input(s): HGBA1C in the last 72 hours. CBG: Recent Labs  Lab 10/08/17 2146 10/09/17 0645  GLUCAP 81 80   Lipid Profile: No results for input(s): CHOL, HDL, LDLCALC, TRIG, CHOLHDL, LDLDIRECT in the last 72 hours. Thyroid Function Tests: No results for input(s): TSH, T4TOTAL, FREET4, T3FREE, THYROIDAB in the last 72 hours. Anemia Panel: No results for input(s): VITAMINB12, FOLATE, FERRITIN, TIBC, IRON, RETICCTPCT in the last 72 hours. Urine analysis:    Component Value Date/Time   COLORURINE STRAW (A) 06/29/2017 1428   APPEARANCEUR CLEAR 06/29/2017 1428   LABSPEC 1.009 06/29/2017 1428   PHURINE 6.0 06/29/2017 1428   GLUCOSEU NEGATIVE 06/29/2017 1428   HGBUR SMALL (A) 06/29/2017 1428   BILIRUBINUR NEGATIVE 06/29/2017 1428   KETONESUR NEGATIVE 06/29/2017 1428   PROTEINUR 100 (A) 06/29/2017 1428   NITRITE NEGATIVE 06/29/2017 1428   LEUKOCYTESUR NEGATIVE 06/29/2017 1428   Sepsis Labs: @LABRCNTIP (procalcitonin:4,lacticidven:4)  )No results found for this or any previous visit (from the past 240 hour(s)).       Radiology Studies: Mr Brain Wo Contrast  Result Date: 10/09/2017 CLINICAL DATA:  Syncopal episode after eating. History of cerebral vasculitis, cardiac arrest, hypertension, lupus, end-stage  renal disease on dialysis. EXAM: MRI HEAD WITHOUT CONTRAST TECHNIQUE: Multiplanar, multiecho pulse sequences of the brain and surrounding structures were obtained without intravenous contrast. COMPARISON:  MRI of the head July 12, 2017 and CT HEAD July 11, 2017. FINDINGS: INTRACRANIAL CONTENTS: No reduced diffusion to suggest acute ischemia. No susceptibility artifact to suggest hemorrhage. The ventricles and sulci are normal for patient's age. Patchy supratentorial white matter FLAIR T2 hyperintensities. 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 Old RIGHT pontine lacunar infarct. Old small LEFT cerebellar infarct. No suspicious parenchymal signal, masses, mass effect. No abnormal extra-axial fluid collections. No extra-axial masses. VASCULAR: Dolichoectatic major intracranial vascular flow voids present at skull base seen with chronic hypertension. SKULL AND UPPER CERVICAL SPINE: No abnormal sellar expansion. No suspicious calvarial bone marrow signal. Craniocervical junction maintained. SINUSES/ORBITS: The mastoid air-cells and included paranasal sinuses are well-aerated.The included ocular globes and orbital contents are non-suspicious. OTHER: Patient is edentulous. IMPRESSION: 1. No acute intracranial process. 2. Moderate chronic small vessel ischemic changes and old lacunar infarcts.  Old small LEFT cerebellar infarct. Electronically Signed   By: Awilda Metro M.D.   On: 10/09/2017 03:09        Scheduled Meds: . felodipine  10 mg Oral Daily  . gabapentin  300 mg Oral Daily  . heparin  5,000 Units Subcutaneous Q8H  . multivitamin  1 tablet Oral QHS  . mycophenolate  1,000 mg Oral BID  . nicotine  21 mg Transdermal Daily  . sevelamer carbonate  800 mg Oral TID WC  . sodium chloride flush  3 mL Intravenous Q12H   Continuous Infusions:   LOS: 0 days    Time spent: 36 minutes    Zeniah Briney,LAWAL, MD [facility] Pager [phone]  If 7PM-7AM, please contact  night-coverage [email or web address] Password [facility] 10/09/2017, 7:18 AM

## 2017-10-09 NOTE — Progress Notes (Signed)
Patient transferred from the ED via wheelchair. Patient belongings with patient. Patient was put on telemetry: box 7. Patient oriented to room and lunch tray was brought with patient during transfer. Patient is alert and oriented x4. Bed alarm is on for patient's safety and call bell is within reach. Will continue to monitor.   De Nurse, RN

## 2017-10-09 NOTE — ED Notes (Signed)
Pt given paper and pen so she could write down a number while speaking with family on the phone.

## 2017-10-10 ENCOUNTER — Observation Stay (HOSPITAL_BASED_OUTPATIENT_CLINIC_OR_DEPARTMENT_OTHER)
Admit: 2017-10-10 | Discharge: 2017-10-10 | Disposition: A | Discharge status: Home / Self Care | Payer: Medicare Other | Attending: Internal Medicine | Admitting: Internal Medicine

## 2017-10-10 ENCOUNTER — Observation Stay (HOSPITAL_BASED_OUTPATIENT_CLINIC_OR_DEPARTMENT_OTHER): Payer: Medicare Other

## 2017-10-10 DIAGNOSIS — N186 End stage renal disease: Secondary | ICD-10-CM | POA: Diagnosis not present

## 2017-10-10 DIAGNOSIS — I10 Essential (primary) hypertension: Secondary | ICD-10-CM | POA: Diagnosis not present

## 2017-10-10 DIAGNOSIS — R55 Syncope and collapse: Secondary | ICD-10-CM | POA: Diagnosis not present

## 2017-10-10 DIAGNOSIS — I351 Nonrheumatic aortic (valve) insufficiency: Secondary | ICD-10-CM

## 2017-10-10 LAB — BASIC METABOLIC PANEL
Anion gap: 15 (ref 5–15)
BUN: 43 mg/dL — ABNORMAL HIGH (ref 6–20)
CALCIUM: 9.3 mg/dL (ref 8.9–10.3)
CO2: 27 mmol/L (ref 22–32)
Chloride: 94 mmol/L — ABNORMAL LOW (ref 101–111)
Creatinine, Ser: 7.79 mg/dL — ABNORMAL HIGH (ref 0.44–1.00)
GFR, EST AFRICAN AMERICAN: 6 mL/min — AB (ref >60)
GFR, EST NON AFRICAN AMERICAN: 5 mL/min — AB (ref >60)
Glucose, Bld: 132 mg/dL — ABNORMAL HIGH (ref 65–99)
Potassium: 3.6 mmol/L (ref 3.5–5.1)
SODIUM: 136 mmol/L (ref 135–145)

## 2017-10-10 LAB — CBC WITH DIFFERENTIAL/PLATELET
Abs Immature Granulocytes: 0 10*3/uL (ref 0.0–0.1)
BASOS ABS: 0.1 10*3/uL (ref 0.0–0.1)
Basophils Relative: 1 %
EOS ABS: 0.2 10*3/uL (ref 0.0–0.7)
EOS PCT: 4 %
HEMATOCRIT: 38.3 % (ref 36.0–46.0)
Hemoglobin: 11.4 g/dL — ABNORMAL LOW (ref 12.0–15.0)
IMMATURE GRANULOCYTES: 0 %
LYMPHS ABS: 1.8 10*3/uL (ref 0.7–4.0)
Lymphocytes Relative: 44 %
MCH: 28.5 pg (ref 26.0–34.0)
MCHC: 29.8 g/dL — AB (ref 30.0–36.0)
MCV: 95.8 fL (ref 78.0–100.0)
Monocytes Absolute: 0.6 10*3/uL (ref 0.1–1.0)
Monocytes Relative: 15 %
NEUTROS PCT: 36 %
Neutro Abs: 1.5 10*3/uL — ABNORMAL LOW (ref 1.7–7.7)
PLATELETS: 185 10*3/uL (ref 150–400)
RBC: 4 MIL/uL (ref 3.87–5.11)
RDW: 14.4 % (ref 11.5–15.5)
WBC: 4.2 10*3/uL (ref 4.0–10.5)

## 2017-10-10 LAB — GLUCOSE, CAPILLARY: GLUCOSE-CAPILLARY: 123 mg/dL — AB (ref 65–99)

## 2017-10-10 LAB — RAPID URINE DRUG SCREEN, HOSP PERFORMED
Amphetamines: NOT DETECTED
Barbiturates: NOT DETECTED
Benzodiazepines: NOT DETECTED
COCAINE: NOT DETECTED
OPIATES: NOT DETECTED
TETRAHYDROCANNABINOL: NOT DETECTED

## 2017-10-10 LAB — ECHOCARDIOGRAM COMPLETE
HEIGHTINCHES: 64 in
Weight: 1657.86 oz

## 2017-10-10 NOTE — Procedures (Signed)
HPI:  57 y/o with hx of seizure presents with syncope.  Medications:   No current facility-administered medications for this encounter.  No current outpatient medications on file.  Facility-Administered Medications Ordered in Other Encounters:  .  acetaminophen (TYLENOL) tablet 650 mg, 650 mg, Oral, Q6H PRN **OR** acetaminophen (TYLENOL) suppository 650 mg, 650 mg, Rectal, Q6H PRN, Ivor Costa, MD .  felodipine (PLENDIL) 24 hr tablet 10 mg, 10 mg, Oral, Daily, Ivor Costa, MD, 10 mg at 10/10/17 1048 .  gabapentin (NEURONTIN) capsule 300 mg, 300 mg, Oral, Daily, Ivor Costa, MD, 300 mg at 10/10/17 1049 .  heparin injection 5,000 Units, 5,000 Units, Subcutaneous, Q8H, Ivor Costa, MD, 5,000 Units at 10/10/17 1416 .  hydrALAZINE (APRESOLINE) injection 5 mg, 5 mg, Intravenous, Q2H PRN, Ivor Costa, MD, 5 mg at 10/09/17 0441 .  hydrOXYzine (ATARAX/VISTARIL) tablet 10 mg, 10 mg, Oral, TID PRN, Ivor Costa, MD, 10 mg at 10/09/17 1427 .  lidocaine-prilocaine (EMLA) cream 1 application, 1 application, Topical, PRN, Ivor Costa, MD .  LORazepam (ATIVAN) injection 1 mg, 1 mg, Intravenous, Q2H PRN, Ivor Costa, MD .  multivitamin (RENA-VIT) tablet 1 tablet, 1 tablet, Oral, Gordan Payment, MD, 1 tablet at 10/09/17 2145 .  mycophenolate (CELLCEPT) capsule 1,000 mg, 1,000 mg, Oral, BID, Ivor Costa, MD, 1,000 mg at 10/10/17 1048 .  nicotine (NICODERM CQ - dosed in mg/24 hours) patch 21 mg, 21 mg, Transdermal, Daily, Ivor Costa, MD, 21 mg at 10/10/17 1053 .  senna-docusate (Senokot-S) tablet 1 tablet, 1 tablet, Oral, QHS PRN, Ivor Costa, MD .  sevelamer carbonate (RENVELA) tablet 800 mg, 800 mg, Oral, TID WC, Ivor Costa, MD, 800 mg at 10/10/17 1415 .  sodium chloride flush (NS) 0.9 % injection 3 mL, 3 mL, Intravenous, Q12H, Ivor Costa, MD, 3 mL at 10/10/17 1047 .  zolpidem (AMBIEN) tablet 5 mg, 5 mg, Oral, QHS PRN, Ivor Costa, MD    TECHNICAL SUMMARY:  A multichannel referential and bipolar montage EEG using the  standard international 10-20 system was performed on the patient described as awake.  The dominant background activity consists of 9 to 10 hertz activity seen most prominantly over the posterior head region.  The backgound activity is reactive to eye opening and closing procedures.  There is an excessive amount of fast (beta) activity throughout the recording.  ACTIVATION:  Stepwise photic stimulation at 4-20 flashes per second was performed and did not elicit any abnormal waveforms.  Hyperventilation was not performed.  EPILEPTIFORM ACTIVITY:  There were no spikes, sharp waves or paroxysmal activity.  SLEEP: No sleep is noted.  CARDIAC:  The EKG lead revealed a regular sinus rhythm.  IMPRESSION:  This EEG demonstrated no focal, hemispheric, or lateralizing features and generally is within ranges of normal.  There was an excessive amount of fast activity noted throughout the recording, which is often due to medication such as benzodiazepines.  Correlate clinically.  There was no epileptiform activity noted on this EEG.

## 2017-10-10 NOTE — Progress Notes (Signed)
Routine EEG completed, results pending. 

## 2017-10-10 NOTE — Progress Notes (Signed)
  Echocardiogram 2D Echocardiogram has been performed.  Darlina Sicilian M 10/10/2017, 9:28 AM

## 2017-10-10 NOTE — Discharge Summary (Signed)
Physician Discharge Summary  Kathleen [name] JTT:[id number] DOB: May 14, 1960 DOA: 10/08/2017  PCP: Angela Burke, NP  Admit date: 10/08/2017 Discharge date: 10/10/2017  Admitted From: home Discharge disposition: home   Recommendations for Outpatient Follow-Up:   1. Referral to counseling for stress management--patient states she has stress at home 2. Referral for event monitor   Discharge Diagnosis:   Principal Problem:   Syncope Active Problems:   Lupus ([place])   Hypertension   ESRD (end stage renal disease) ([place])   Anemia    Discharge Condition: Improved.  Diet recommendation: renal diet  Wound care: None.  Code status: Full.   History of Present Illness:   Kathleen [name] is a 57 y.o. female with medical history significant of ESRD (TTS), anemia, COPD, hypertension, TIA, seizure, lupus, who presents with syncope.   Patient states that she had dialysis earlier today. She was with her friend at [facility] this PM, and suddenly felt hot. She went outside to cool off, but felt very weak andlightheaded,then passed out for a few seconds. Her friend helped her to the ground, no injury. She felt normal after a few minutes.  Patient denies unilateral weakness, numbness or tingling his extremities.  No facial droop, slurred speech.  Denies headache, chest pain, shortness of breath.  no fever, chills.  she states that she has mild intermittent chronic diarrhea, which has not changed today.  No nausea vomiting or abdominal pain.  She does not make urine anymore.  No symptoms of UTI.      Hospital Course by Problem:   1 syncope: Patient appears to have had recurrent episodes in the past.  She has issues with her blood pressure with medications being changed frequently.  It appears she is having similar episode today.  -MRI of the brain : no acure changes -echo : Left ventricle: The cavity size was normal. Wall thickness was   increased in a pattern of mild LVH.  Systolic function was normal.   The estimated ejection fraction was in the range of 55% to 60%.   Wall motion was normal; there were no regional wall motion   abnormalities. Doppler parameters are consistent with abnormal   left ventricular relaxation (grade 1 diastolic dysfunction). - Aortic valve: Trileaflet; mildly thickened, mildly calcified   leaflets. There was mild regurgitation. - Aorta: Ascending aortic diameter: 42 mm (S). - Ascending aorta: The ascending aorta was mildly dilated. - Mitral valve: Calcified annulus. -EEG-- w/o focal seizures -? Stress related vs vasovagal- had cake and then threw up  #2 hypertension: Blood pressure is stable at this point.  No obvious drop in her blood pressure.  We'll continue home regimen -- nephrology to review at next HD  #3 end-stage renal disease: Hemodialysis on Tuesdays Thursdays and Saturdays.  She was dialyzed saturday  #4 history of lupus: Stable at this point.  Continue home regimen.  #5 anemia of chronic disease: defer to renal      Medical Consultants:   none   Discharge Exam:   Vitals:   10/10/17 0355 10/10/17 0826  BP: (!) 141/94 (!) 125/97  Pulse: 80 83  Resp: 16 16  Temp: 98.4 F (36.9 C) 98.2 F (36.8 C)  SpO2: 100%    Vitals:   10/09/17 2055 10/10/17 0355 10/10/17 0500 10/10/17 0826  BP: 126/90 (!) 141/94  (!) 125/97  Pulse: 79 80  83  Resp: 14 16  16   Temp: 97.6 F (36.4 C) 98.4 F (36.9 C)  98.2 F (36.8 C)  TempSrc: Oral Oral  Oral  SpO2: 99% 100%    Weight: 47 kg (103 lb 9.9 oz)  47 kg (103 lb 9.9 oz)   Height:        General exam: Appears calm and comfortable. Says her living situation is not good  The results of significant diagnostics from this hospitalization (including imaging, microbiology, ancillary and laboratory) are listed below for reference.     Procedures and Diagnostic Studies:   No results found.   Labs:   Basic Metabolic Panel: Recent Labs  Lab 10/08/17 2040  10/09/17 0442 10/10/17 0741  NA 137 137 136  K 4.7 4.1 3.6  CL 96* 94* 94*  CO2 31 30 27   GLUCOSE 83 90 132*  BUN 12 17 43*  CREATININE 4.06* 4.95* 7.79*  CALCIUM 8.8* 9.4 9.3   GFR Estimated Creatinine Clearance: 5.9 mL/min (A) (by C-G formula based on SCr of 7.79 mg/dL (H)). Liver Function Tests: No results for input(s): AST, ALT, ALKPHOS, BILITOT, PROT, ALBUMIN in the last 168 hours. No results for input(s): LIPASE, AMYLASE in the last 168 hours. No results for input(s): AMMONIA in the last 168 hours. Coagulation profile No results for input(s): INR, PROTIME in the last 168 hours.  CBC: Recent Labs  Lab 10/08/17 2040 10/09/17 0442 10/10/17 0741  WBC 5.7 6.0 4.2  NEUTROABS 3.4  --  1.5*  HGB 11.9* 12.1 11.4*  HCT 39.9 41.1 38.3  MCV 97.3 97.6 95.8  PLT 154 185 185   Cardiac Enzymes: No results for input(s): CKTOTAL, CKMB, CKMBINDEX, TROPONINI in the last 168 hours. BNP: Invalid input(s): POCBNP CBG: Recent Labs  Lab 10/08/17 2146 10/09/17 0645 10/10/17 0810  GLUCAP 81 80 123*   D-Dimer No results for input(s): DDIMER in the last 72 hours. Hgb A1c No results for input(s): HGBA1C in the last 72 hours. Lipid Profile No results for input(s): CHOL, HDL, LDLCALC, TRIG, CHOLHDL, LDLDIRECT in the last 72 hours. Thyroid function studies No results for input(s): TSH, T4TOTAL, T3FREE, THYROIDAB in the last 72 hours.  Invalid input(s): FREET3 Anemia work up No results for input(s): VITAMINB12, FOLATE, FERRITIN, TIBC, IRON, RETICCTPCT in the last 72 hours. Microbiology Recent Results (from the past 240 hour(s))  MRSA PCR Screening     Status: None   Collection Time: 10/09/17  8:00 PM  Result Value Ref Range Status   MRSA by PCR NEGATIVE NEGATIVE Final    Comment:        The GeneXpert MRSA Assay (FDA approved for NASAL specimens only), is one component of a comprehensive MRSA colonization surveillance program. It is not intended to diagnose MRSA infection  nor to guide or monitor treatment for MRSA infections. Performed at [facility], [place] [address] [place], [place] [address]      Discharge Instructions:   Discharge Instructions    Ambulatory referral to Psychiatry   Complete by:  As directed    Discharge instructions   Complete by:  As directed    PCP to do referral for event monitor Stress reduction-- outpatient counseling Renal diet   Increase activity slowly   Complete by:  As directed      Allergies as of 10/10/2017   No Known Allergies     Medication List    TAKE these medications   felodipine 5 MG 24 hr tablet Commonly known as:  PLENDIL Take 2 tablets (10 mg total) by mouth daily.   gabapentin 300 MG capsule Commonly  known as:  NEURONTIN Take 1 capsule (300 mg total) by mouth daily.   lidocaine-prilocaine cream Commonly known as:  EMLA Apply 1 application topically as needed (topical anesthesia for hemodialysis if Gebauers and Lidocaine injection are ineffective.).   multivitamin Tabs tablet Take 1 tablet by mouth at bedtime.   mycophenolate 500 MG tablet Commonly known as:  CELLCEPT Take 2 tablets (1,000 mg total) by mouth 2 (two) times daily.   sevelamer carbonate 800 MG tablet Commonly known as:  RENVELA Take 1 tablet (800 mg total) by mouth 3 (three) times daily with meals.      Follow-up Information    Angela Burke, NP Follow up in 1 week(s).   Specialty:  Internal Medicine Why:  referral for event monitor Contact information: [address] [place] [address] [phone]            Time coordinating discharge: 35 min  Signed:  Geradine Girt  [facility] 10/10/2017, 4:38 PM

## 2018-03-11 ENCOUNTER — Encounter (HOSPITAL_COMMUNITY): Payer: Self-pay | Admitting: Emergency Medicine

## 2018-03-11 ENCOUNTER — Emergency Department (HOSPITAL_COMMUNITY)
Admission: EM | Admit: 2018-03-11 | Discharge: 2018-03-12 | Disposition: A | Discharge status: Home / Self Care | Payer: Medicare Other | Attending: Emergency Medicine | Admitting: Emergency Medicine

## 2018-03-11 ENCOUNTER — Other Ambulatory Visit: Payer: Self-pay

## 2018-03-11 DIAGNOSIS — Z79899 Other long term (current) drug therapy: Secondary | ICD-10-CM | POA: Diagnosis not present

## 2018-03-11 DIAGNOSIS — I120 Hypertensive chronic kidney disease with stage 5 chronic kidney disease or end stage renal disease: Secondary | ICD-10-CM | POA: Insufficient documentation

## 2018-03-11 DIAGNOSIS — J449 Chronic obstructive pulmonary disease, unspecified: Secondary | ICD-10-CM | POA: Insufficient documentation

## 2018-03-11 DIAGNOSIS — F17210 Nicotine dependence, cigarettes, uncomplicated: Secondary | ICD-10-CM | POA: Diagnosis not present

## 2018-03-11 DIAGNOSIS — R1030 Lower abdominal pain, unspecified: Secondary | ICD-10-CM | POA: Diagnosis not present

## 2018-03-11 DIAGNOSIS — N186 End stage renal disease: Secondary | ICD-10-CM | POA: Insufficient documentation

## 2018-03-11 LAB — CBC
HCT: 38.3 % (ref 36.0–46.0)
HEMOGLOBIN: 11.3 g/dL — AB (ref 12.0–15.0)
MCH: 30.3 pg (ref 26.0–34.0)
MCHC: 29.5 g/dL — ABNORMAL LOW (ref 30.0–36.0)
MCV: 102.7 fL — ABNORMAL HIGH (ref 80.0–100.0)
NRBC: 0.5 % — AB (ref 0.0–0.2)
Platelets: 224 10*3/uL (ref 150–400)
RBC: 3.73 MIL/uL — AB (ref 3.87–5.11)
RDW: 15.2 % (ref 11.5–15.5)
WBC: 7.8 10*3/uL (ref 4.0–10.5)

## 2018-03-11 LAB — COMPREHENSIVE METABOLIC PANEL
ALBUMIN: 4.1 g/dL (ref 3.5–5.0)
ALT: 18 U/L (ref 0–44)
ANION GAP: 11 (ref 5–15)
AST: 35 U/L (ref 15–41)
Alkaline Phosphatase: 60 U/L (ref 38–126)
BILIRUBIN TOTAL: 0.4 mg/dL (ref 0.3–1.2)
BUN: 10 mg/dL (ref 6–20)
CO2: 34 mmol/L — ABNORMAL HIGH (ref 22–32)
Calcium: 9.6 mg/dL (ref 8.9–10.3)
Chloride: 91 mmol/L — ABNORMAL LOW (ref 98–111)
Creatinine, Ser: 3.6 mg/dL — ABNORMAL HIGH (ref 0.44–1.00)
GFR, EST AFRICAN AMERICAN: 15 mL/min — AB (ref >60)
GFR, EST NON AFRICAN AMERICAN: 13 mL/min — AB (ref >60)
GLUCOSE: 114 mg/dL — AB (ref 70–99)
POTASSIUM: 3.8 mmol/L (ref 3.5–5.1)
Sodium: 136 mmol/L (ref 135–145)
TOTAL PROTEIN: 8.7 g/dL — AB (ref 6.5–8.1)

## 2018-03-11 LAB — LIPASE, BLOOD: Lipase: 92 U/L — ABNORMAL HIGH (ref 11–51)

## 2018-03-11 MED ORDER — SODIUM CHLORIDE 0.9 % IV BOLUS
500.0000 mL | Freq: Once | INTRAVENOUS | Status: AC
  Administered 2018-03-11: 500 mL via INTRAVENOUS

## 2018-03-11 MED ORDER — ACETAMINOPHEN 325 MG PO TABS
650.0000 mg | ORAL_TABLET | Freq: Once | ORAL | Status: AC
  Administered 2018-03-11: 650 mg via ORAL
  Filled 2018-03-11: qty 2

## 2018-03-11 NOTE — ED Provider Notes (Signed)
History of ESRD-HD Missed session and went for extra session this week (3 consecutive days) and is now dizzy and weak with nausea - no vomiting. Has lower abdominal pain Getting CT scan  Plan - review CT  Home if negative She received small bolus, Tylenol for headache Stable otherwise  CT negative for acute findings. Recheck finds the patient comfortable, able to rest/sleep. No complaints. She can be discharged home per plan of previous treatment team.    Charlann Lange, PA-C 03/12/18 2313    Sherwood Gambler, MD 03/17/18 [phone]

## 2018-03-11 NOTE — ED Triage Notes (Addendum)
Per EMS- pt completed dialysis today. She began having abdominal cramping at dialysis, still having abdominal pain and nausea. No diarrhea or emesis. No cp. Pt CBg was 13, not diabetic. Pt states she just feels funny. No neuro deficits at present. Pt states her pain is to the RLQ of the abdomen.

## 2018-03-11 NOTE — ED Provider Notes (Signed)
[facility] EMERGENCY DEPARTMENT Provider Note   CSN: [id number] Arrival date & time: 03/11/18  1724     History   Chief Complaint Chief Complaint  Patient presents with  . Abdominal Pain  . Nausea    HPI Kathleen [name] is a 57 y.o. female.  HPI  57 year old female on dialysis presents with nausea and abdominal pain.  Patient states she has not been feeling well for a couple days and describes this as lightheadedness and nausea.  Mild on and off headache.  She is also been having on and off lower abdominal pain that feels like an aching sensation.  Some diarrhea earlier in the week that has improved but is not all the way gone.  She states she missed last Saturday, 1 week ago dialysis, and requested increased dialysis this week.  She has had dialysis Thursday, Friday, and today, Saturday.  She states that she was feeling poorly dialysis so they sent her here.  No chest pain/shortness of breath.  No fevers.  She does not urinate hardly at all.  No vomiting.  Denies any vaginal bleeding or discharge and states she has not had intercourse in a long time.  No concern for STI.  Past Medical History:  Diagnosis Date  . Anemia   . Arthritis   . Cardiac arrest ([place])   . Cellulitis   . Cerebral vasculitis   . COPD (chronic obstructive pulmonary disease) ([place])   . Elevated antinuclear antibody (ANA) level   . Endometriosis   . ESRD (end stage renal disease) on dialysis ([place])   . Gallstones   . Hypertension   . Lupus ([place])   . Migraine   . PONV (postoperative nausea and vomiting)   . Seizures ([place])   . Sepsis ([place])   . Suicide attempt ([place])   . TB (tuberculosis)   . Transient ischemic attack (TIA)   . Vasculitis of skin     Patient Active Problem List   Diagnosis Date Noted  . Syncope 10/08/2017  . Acute encephalopathy 07/11/2017  . ESRD (end stage renal disease) ([place]) 07/11/2017  . COPD (chronic obstructive pulmonary disease) ([place])   . Anemia   . Benign  essential HTN 07/01/2017  . Stage 5 chronic kidney disease not on chronic dialysis ([place])   . Hypertensive crisis 06/29/2017  . Hypertensive emergency   . Lupus erythematosus   . Malnutrition of moderate degree 07/14/2016  . Lupus ([place]) 06/28/2016  . Hypertension 06/28/2016  . Kidney disease 06/28/2016  . Symptomatic anemia 06/28/2016  . Abdominal pain 06/28/2016  . Current non-adherence to medical treatment 06/28/2016  . Hypertensive urgency 06/28/2016  . SLE exacerbation ([place]) 06/28/2016  . Renal insufficiency     Past Surgical History:  Procedure Laterality Date  . AV FISTULA PLACEMENT Left 07/04/2017   Procedure: ARTERIOVENOUS (AV) FISTULA CREATION;  Surgeon: Conrad [place], MD;  Location: [facility];  Service: Vascular;  Laterality: Left;  . CESAREAN SECTION    . ESOPHAGOGASTRODUODENOSCOPY N/A 07/15/2016   Procedure: ESOPHAGOGASTRODUODENOSCOPY (EGD);  Surgeon: Laurence Spates, MD;  [facility] ENDOSCOPY;  Service: Endoscopy;  Laterality: N/A;  . INSERTION OF DIALYSIS CATHETER Right 07/04/2017   Procedure: INSERTION OF TUNNELED  DIALYSIS CATHETER - RIGHT INTERNAL JUGULAR PLACEMENT;  Surgeon: Conrad [place], MD;  Location: [facility];  Service: Vascular;  Laterality: Right;  . TUBAL LIGATION       OB History   None      Home Medications    Prior to  Admission medications   Medication Sig Start Date End Date Taking? Authorizing Provider  felodipine (PLENDIL) 5 MG 24 hr tablet Take 2 tablets (10 mg total) by mouth daily. 07/13/17   Rai, Vernelle Emerald, MD  gabapentin (NEURONTIN) 300 MG capsule Take 1 capsule (300 mg total) by mouth daily. 07/13/17   Rai, Vernelle Emerald, MD  lidocaine-prilocaine (EMLA) cream Apply 1 application topically as needed (topical anesthesia for hemodialysis if Gebauers and Lidocaine injection are ineffective.). 07/07/17   Georgette Shell, MD  multivitamin (RENA-VIT) TABS tablet Take 1 tablet by mouth at bedtime. 07/07/17   Georgette Shell, MD  mycophenolate (CELLCEPT)  500 MG tablet Take 2 tablets (1,000 mg total) by mouth 2 (two) times daily. 07/13/17   Rai, Vernelle Emerald, MD  sevelamer carbonate (RENVELA) 800 MG tablet Take 1 tablet (800 mg total) by mouth 3 (three) times daily with meals. 07/13/17   Mendel Corning, MD    Family History Family History  Problem Relation Age of Onset  . Ovarian cancer Mother   . Breast cancer Sister   . Throat cancer Brother     Social History Social History   Tobacco Use  . Smoking status: Current Some Day Smoker    Packs/day: 0.50    Types: Cigarettes  . Smokeless tobacco: Never Used  Substance Use Topics  . Alcohol use: No  . Drug use: No     Allergies   Patient has no known allergies.   Review of Systems Review of Systems  Constitutional: Negative for fever.  Respiratory: Negative for shortness of breath.   Cardiovascular: Negative for chest pain.  Gastrointestinal: Positive for abdominal pain, diarrhea and nausea. Negative for vomiting.  Neurological: Positive for light-headedness and headaches.  All other systems reviewed and are negative.    Physical Exam Updated Vital Signs BP (!) 123/96   Pulse 87   Temp 98.2 F (36.8 C) (Oral)   Resp 18   SpO2 100%   Physical Exam  Constitutional: She is oriented to person, place, and time. She appears well-developed and well-nourished.  Non-toxic appearance. She does not appear ill. No distress.  HENT:  Head: Normocephalic and atraumatic.  Right Ear: External ear normal.  Left Ear: External ear normal.  Nose: Nose normal.  Mouth/Throat: Mucous membranes are dry.  Eyes: Right eye exhibits no discharge. Left eye exhibits no discharge.  Cardiovascular: Normal rate, regular rhythm and normal heart sounds.  Pulmonary/Chest: Effort normal and breath sounds normal.  Abdominal: Soft. There is tenderness in the right lower quadrant, suprapubic area and left lower quadrant.  Neurological: She is alert and oriented to person, place, and time.  CN 3-12  grossly intact. 5/5 strength in all 4 extremities. Grossly normal sensation. Normal finger to nose.   Skin: Skin is warm and dry.  Psychiatric: Her mood appears not anxious.  Nursing note and vitals reviewed.    ED Treatments / Results  Labs (all labs ordered are listed, but only abnormal results are displayed) Labs Reviewed  LIPASE, BLOOD - Abnormal; Notable for the following components:      Result Value   Lipase 92 (*)    All other components within normal limits  COMPREHENSIVE METABOLIC PANEL - Abnormal; Notable for the following components:   Chloride 91 (*)    CO2 34 (*)    Glucose, Bld 114 (*)    Creatinine, Ser 3.60 (*)    Total Protein 8.7 (*)    GFR calc non Af Amer 13 (*)  GFR calc Af Amer 15 (*)    All other components within normal limits  CBC - Abnormal; Notable for the following components:   RBC 3.73 (*)    Hemoglobin 11.3 (*)    MCV 102.7 (*)    MCHC 29.5 (*)    nRBC 0.5 (*)    All other components within normal limits  URINALYSIS, ROUTINE W REFLEX MICROSCOPIC    EKG EKG Interpretation  Date/Time:  Saturday March 11 2018 17:38:12 EDT Ventricular Rate:  86 PR Interval:  156 QRS Duration: 78 QT Interval:  448 QTC Calculation: 536 R Axis:   45 Text Interpretation:  Normal sinus rhythm Possible Left atrial enlargement Left ventricular hypertrophy Nonspecific T wave abnormality Prolonged QT Abnormal ECG Confirmed by Sherwood Gambler [phone]) on 03/11/2018 8:22:27 PM   Radiology No results found.  Procedures Procedures (including critical care time)  Medications Ordered in ED Medications  sodium chloride 0.9 % bolus 500 mL (500 mLs Intravenous New Bag/Given 03/11/18 2110)  acetaminophen (TYLENOL) tablet 650 mg (650 mg Oral Given 03/11/18 2059)     Initial Impression / Assessment and Plan / ED Course  I have reviewed the triage vital signs and the nursing notes.  Pertinent labs & imaging results that were available during my care of the patient  were reviewed by me and considered in my medical decision making (see chart for details).     Patient's dizziness and overall feeling odd is probably more from extra dialysis and probably some dehydration.  She was given a small bolus of fluids.  As for her abdominal pain, I think given no obvious clear cause, CT is needed.  No vaginal symptoms.  She does not urinate. Care to PA [place] with CT pending.  Final Clinical Impressions(s) / ED Diagnoses   Final diagnoses:  None    ED Discharge Orders    None       Sherwood Gambler, MD 03/11/18 2205

## 2018-03-12 ENCOUNTER — Emergency Department (HOSPITAL_COMMUNITY): Payer: Medicare Other

## 2018-03-12 DIAGNOSIS — R1030 Lower abdominal pain, unspecified: Secondary | ICD-10-CM | POA: Diagnosis not present

## 2018-03-12 NOTE — ED Notes (Signed)
Patient verbalizes understanding of discharge instructions. Opportunity for questioning and answers were provided. Armband removed by staff, pt discharged from ED.  

## 2018-03-12 NOTE — Discharge Instructions (Addendum)
Your tests here are essentially negative, including your CT scan. If your abdominal pain continues, follow up with  your doctor for recheck this week. Return to the emergency department for any new or worsening symptoms.

## 2018-05-02 ENCOUNTER — Emergency Department (HOSPITAL_COMMUNITY)
Admission: EM | Admit: 2018-05-02 | Discharge: 2018-05-02 | Disposition: A | Discharge status: Home / Self Care | Payer: Medicare Other | Attending: Emergency Medicine | Admitting: Emergency Medicine

## 2018-05-02 ENCOUNTER — Encounter (HOSPITAL_COMMUNITY): Payer: Self-pay | Admitting: Emergency Medicine

## 2018-05-02 ENCOUNTER — Emergency Department (HOSPITAL_COMMUNITY): Payer: Medicare Other

## 2018-05-02 DIAGNOSIS — I120 Hypertensive chronic kidney disease with stage 5 chronic kidney disease or end stage renal disease: Secondary | ICD-10-CM | POA: Insufficient documentation

## 2018-05-02 DIAGNOSIS — N186 End stage renal disease: Secondary | ICD-10-CM | POA: Diagnosis not present

## 2018-05-02 DIAGNOSIS — F17210 Nicotine dependence, cigarettes, uncomplicated: Secondary | ICD-10-CM | POA: Diagnosis not present

## 2018-05-02 DIAGNOSIS — Z79899 Other long term (current) drug therapy: Secondary | ICD-10-CM | POA: Diagnosis not present

## 2018-05-02 DIAGNOSIS — R1030 Lower abdominal pain, unspecified: Secondary | ICD-10-CM

## 2018-05-02 DIAGNOSIS — N739 Female pelvic inflammatory disease, unspecified: Secondary | ICD-10-CM | POA: Diagnosis not present

## 2018-05-02 DIAGNOSIS — J449 Chronic obstructive pulmonary disease, unspecified: Secondary | ICD-10-CM | POA: Insufficient documentation

## 2018-05-02 DIAGNOSIS — N898 Other specified noninflammatory disorders of vagina: Secondary | ICD-10-CM | POA: Insufficient documentation

## 2018-05-02 LAB — COMPREHENSIVE METABOLIC PANEL
ALBUMIN: 3.5 g/dL (ref 3.5–5.0)
ALK PHOS: 54 U/L (ref 38–126)
ALT: 12 U/L (ref 0–44)
ANION GAP: 15 (ref 5–15)
AST: 37 U/L (ref 15–41)
BUN: 16 mg/dL (ref 6–20)
CO2: 30 mmol/L (ref 22–32)
Calcium: 9.6 mg/dL (ref 8.9–10.3)
Chloride: 95 mmol/L — ABNORMAL LOW (ref 98–111)
Creatinine, Ser: 7.01 mg/dL — ABNORMAL HIGH (ref 0.44–1.00)
GFR calc Af Amer: 7 mL/min — ABNORMAL LOW (ref >60)
GFR calc non Af Amer: 6 mL/min — ABNORMAL LOW (ref >60)
GLUCOSE: 85 mg/dL (ref 70–99)
POTASSIUM: 4.4 mmol/L (ref 3.5–5.1)
SODIUM: 140 mmol/L (ref 135–145)
Total Bilirubin: 0.6 mg/dL (ref 0.3–1.2)
Total Protein: 7.7 g/dL (ref 6.5–8.1)

## 2018-05-02 LAB — LIPASE, BLOOD: Lipase: 110 U/L — ABNORMAL HIGH (ref 11–51)

## 2018-05-02 LAB — WET PREP, GENITAL
CLUE CELLS WET PREP: NONE SEEN
Sperm: NONE SEEN
Trich, Wet Prep: NONE SEEN
YEAST WET PREP: NONE SEEN

## 2018-05-02 LAB — CBC WITH DIFFERENTIAL/PLATELET
Abs Immature Granulocytes: 0.02 10*3/uL (ref 0.00–0.07)
BASOS ABS: 0.1 10*3/uL (ref 0.0–0.1)
Basophils Relative: 1 %
Eosinophils Absolute: 0.1 10*3/uL (ref 0.0–0.5)
Eosinophils Relative: 2 %
HEMATOCRIT: 37.5 % (ref 36.0–46.0)
HEMOGLOBIN: 11.5 g/dL — AB (ref 12.0–15.0)
IMMATURE GRANULOCYTES: 0 %
LYMPHS ABS: 1.6 10*3/uL (ref 0.7–4.0)
LYMPHS PCT: 32 %
MCH: 31.7 pg (ref 26.0–34.0)
MCHC: 30.7 g/dL (ref 30.0–36.0)
MCV: 103.3 fL — ABNORMAL HIGH (ref 80.0–100.0)
Monocytes Absolute: 0.5 10*3/uL (ref 0.1–1.0)
Monocytes Relative: 10 %
NEUTROS PCT: 55 %
NRBC: 0 % (ref 0.0–0.2)
Neutro Abs: 2.7 10*3/uL (ref 1.7–7.7)
Platelets: 174 10*3/uL (ref 150–400)
RBC: 3.63 MIL/uL — ABNORMAL LOW (ref 3.87–5.11)
RDW: 15.4 % (ref 11.5–15.5)
WBC: 5 10*3/uL (ref 4.0–10.5)

## 2018-05-02 LAB — RPR: RPR Ser Ql: NONREACTIVE

## 2018-05-02 LAB — HIV ANTIBODY (ROUTINE TESTING W REFLEX): HIV SCREEN 4TH GENERATION: NONREACTIVE

## 2018-05-02 LAB — I-STAT BETA HCG BLOOD, ED (MC, WL, AP ONLY): HCG, QUANTITATIVE: 5.1 m[IU]/mL — AB (ref <5)

## 2018-05-02 MED ORDER — METRONIDAZOLE 500 MG PO TABS
500.0000 mg | ORAL_TABLET | Freq: Once | ORAL | Status: AC
  Administered 2018-05-02: 500 mg via ORAL
  Filled 2018-05-02: qty 1

## 2018-05-02 MED ORDER — DOXYCYCLINE HYCLATE 100 MG PO TABS
100.0000 mg | ORAL_TABLET | Freq: Once | ORAL | Status: AC
  Administered 2018-05-02: 100 mg via ORAL
  Filled 2018-05-02: qty 1

## 2018-05-02 MED ORDER — METRONIDAZOLE 500 MG PO TABS: 500.0000 mg | ORAL_TABLET | Freq: Two times a day (BID) | ORAL | 0 refills | Status: AC

## 2018-05-02 MED ORDER — LIDOCAINE HCL (PF) 1 % IJ SOLN
INTRAMUSCULAR | Status: AC
  Administered 2018-05-02: 1 mL
  Filled 2018-05-02: qty 5

## 2018-05-02 MED ORDER — CEFTRIAXONE SODIUM 250 MG IJ SOLR
250.0000 mg | Freq: Once | INTRAMUSCULAR | Status: AC
  Administered 2018-05-02: 250 mg via INTRAMUSCULAR
  Filled 2018-05-02: qty 250

## 2018-05-02 MED ORDER — DOXYCYCLINE HYCLATE 100 MG PO CAPS: 100.0000 mg | ORAL_CAPSULE | Freq: Two times a day (BID) | ORAL | 0 refills | Status: AC

## 2018-05-02 NOTE — ED Notes (Signed)
Pt given discharge instructions/presciptions. Pt verbalized understanding, esignature not available.

## 2018-05-02 NOTE — ED Provider Notes (Signed)
[facility] EMERGENCY DEPARTMENT Provider Note   CSN: [id number] Arrival date & time: 05/02/18  1884     History   Chief Complaint Chief Complaint  Patient presents with  . Abdominal Pain    HPI Kathleen [name] is a 57 y.o. female.  Kathleen [name] is a 57 y.o. female history of lupus,ESRD on dialysis, seizures, COPD, who presents to the emergency department for evaluation of vaginal discharge and lower abdominal pain.  Reports about a week ago she had unprotected sex with a new partner and is concerned she may have been exposed to an STD. She describes vaginal discharge as "milky white" and reports she has had large amounts of that over the past few days.  Reports intermittent cramping lower abdominal pain.  No nausea or vomiting.  No diarrhea, melena or hematochezia.  No fevers or chills.  History of prior tubal ligation. Pt completed dialysis yesterday with no complications.     Past Medical History:  Diagnosis Date  . Anemia   . Arthritis   . Cardiac arrest ([place])   . Cellulitis   . Cerebral vasculitis   . COPD (chronic obstructive pulmonary disease) ([place])   . Elevated antinuclear antibody (ANA) level   . Endometriosis   . ESRD (end stage renal disease) on dialysis ([place])   . Gallstones   . Hypertension   . Lupus ([place])   . Migraine   . PONV (postoperative nausea and vomiting)   . Seizures ([place])   . Sepsis ([place])   . Suicide attempt ([place])   . TB (tuberculosis)   . Transient ischemic attack (TIA)   . Vasculitis of skin     Patient Active Problem List   Diagnosis Date Noted  . Syncope 10/08/2017  . Acute encephalopathy 07/11/2017  . ESRD (end stage renal disease) ([place]) 07/11/2017  . COPD (chronic obstructive pulmonary disease) ([place])   . Anemia   . Benign essential HTN 07/01/2017  . Stage 5 chronic kidney disease not on chronic dialysis ([place])   . Hypertensive crisis 06/29/2017  . Hypertensive emergency   . Lupus erythematosus   .  Malnutrition of moderate degree 07/14/2016  . Lupus ([place]) 06/28/2016  . Hypertension 06/28/2016  . Kidney disease 06/28/2016  . Symptomatic anemia 06/28/2016  . Abdominal pain 06/28/2016  . Current non-adherence to medical treatment 06/28/2016  . Hypertensive urgency 06/28/2016  . SLE exacerbation ([place]) 06/28/2016  . Renal insufficiency     Past Surgical History:  Procedure Laterality Date  . AV FISTULA PLACEMENT Left 07/04/2017   Procedure: ARTERIOVENOUS (AV) FISTULA CREATION;  Surgeon: Conrad Kings Beach, MD;  Location: [facility];  Service: Vascular;  Laterality: Left;  . CESAREAN SECTION    . ESOPHAGOGASTRODUODENOSCOPY N/A 07/15/2016   Procedure: ESOPHAGOGASTRODUODENOSCOPY (EGD);  Surgeon: Laurence Spates, MD;  [facility] ENDOSCOPY;  Service: Endoscopy;  Laterality: N/A;  . INSERTION OF DIALYSIS CATHETER Right 07/04/2017   Procedure: INSERTION OF TUNNELED  DIALYSIS CATHETER - RIGHT INTERNAL JUGULAR PLACEMENT;  Surgeon: Conrad Lamont, MD;  Location: [facility];  Service: Vascular;  Laterality: Right;  . TUBAL LIGATION       OB History   No obstetric history on file.      Home Medications    Prior to Admission medications   Medication Sig Start Date End Date Taking? Authorizing Provider  doxycycline (VIBRAMYCIN) 100 MG capsule Take 1 capsule (100 mg total) by mouth 2 (two) times daily for 14 days. One po bid x 7 days  05/02/18 05/16/18  Jacqlyn Larsen, PA-C  felodipine (PLENDIL) 5 MG 24 hr tablet Take 2 tablets (10 mg total) by mouth daily. Patient not taking: Reported on 03/11/2018 07/13/17   Rai, Vernelle Emerald, MD  gabapentin (NEURONTIN) 300 MG capsule Take 1 capsule (300 mg total) by mouth daily. Patient not taking: Reported on 03/11/2018 07/13/17   Mendel Corning, MD  lidocaine-prilocaine (EMLA) cream Apply 1 application topically as needed (topical anesthesia for hemodialysis if Gebauers and Lidocaine injection are ineffective.). Patient not taking: Reported on 03/11/2018 07/07/17   Georgette Shell, MD  metroNIDAZOLE (FLAGYL) 500 MG tablet Take 1 tablet (500 mg total) by mouth 2 (two) times daily for 14 days. One po bid x 7 days 05/02/18 05/16/18  Jacqlyn Larsen, PA-C  multivitamin (RENA-VIT) TABS tablet Take 1 tablet by mouth at bedtime. Patient not taking: Reported on 03/11/2018 07/07/17   Georgette Shell, MD  mycophenolate (CELLCEPT) 500 MG tablet Take 2 tablets (1,000 mg total) by mouth 2 (two) times daily. Patient not taking: Reported on 03/11/2018 07/13/17   Rai, Vernelle Emerald, MD  sevelamer carbonate (RENVELA) 800 MG tablet Take 1 tablet (800 mg total) by mouth 3 (three) times daily with meals. Patient taking differently: Take 1,600 mg by mouth 3 (three) times daily with meals.  07/13/17   Mendel Corning, MD    Family History Family History  Problem Relation Age of Onset  . Ovarian cancer Mother   . Breast cancer Sister   . Throat cancer Brother     Social History Social History   Tobacco Use  . Smoking status: Current Some Day Smoker    Packs/day: 0.50    Types: Cigarettes  . Smokeless tobacco: Never Used  Substance Use Topics  . Alcohol use: No  . Drug use: No     Allergies   Patient has no known allergies.   Review of Systems Review of Systems  Constitutional: Negative for chills and fever.  HENT: Negative.   Eyes: Negative for visual disturbance.  Respiratory: Negative for cough and shortness of breath.   Cardiovascular: Negative for chest pain.  Gastrointestinal: Positive for abdominal pain. Negative for constipation, diarrhea, nausea and vomiting.  Genitourinary: Positive for pelvic pain and vaginal discharge. Negative for dysuria, frequency and vaginal bleeding.  Musculoskeletal: Negative for arthralgias, back pain and myalgias.  Skin: Negative for color change and rash.  Neurological: Negative for dizziness, syncope and light-headedness.     Physical Exam Updated Vital Signs BP (!) 184/109 (BP Location: Right Arm)   Temp 98 F (36.7 C) (Oral)    Resp 15   SpO2 100%   Physical Exam Vitals signs and nursing note reviewed. Exam conducted with a chaperone present.  Constitutional:      General: She is not in acute distress.    Appearance: She is well-developed. She is not diaphoretic.  HENT:     Head: Normocephalic and atraumatic.  Eyes:     General:        Right eye: No discharge.        Left eye: No discharge.     Pupils: Pupils are equal, round, and reactive to light.  Neck:     Musculoskeletal: Neck supple.  Cardiovascular:     Rate and Rhythm: Normal rate and regular rhythm.     Heart sounds: Normal heart sounds.  Pulmonary:     Effort: Pulmonary effort is normal. No respiratory distress.     Breath sounds: Normal breath  sounds. No wheezing or rales.     Comments: Respirations equal and unlabored, patient able to speak in full sentences, lungs clear to auscultation bilaterally Abdominal:     General: Abdomen is flat. Bowel sounds are normal. There is no distension.     Palpations: Abdomen is soft. There is no mass.     Tenderness: There is abdominal tenderness in the right lower quadrant, suprapubic area and left lower quadrant. There is no guarding.     Comments: Abdomen is soft, nondistended, bowel sounds present throughout, there is mild tenderness across the lower abdomen without focal guarding, no rebound tenderness or rigidity, no peritoneal signs.  Genitourinary:    Vagina: Vaginal discharge present. No erythema, tenderness or bleeding.     Cervix: Discharge present.     Comments: No external genital lesions noted. Speculum exam reveals copious amounts of yellow-white discharge.  Discharge present at the cervical os Bimanual exam is very uncomfortable for the patient but there is no focal cervical motion tenderness, no palpable adnexal masses, left adnexa more tender than right. Musculoskeletal:        General: No deformity.  Skin:    General: Skin is warm and dry.     Capillary Refill: Capillary refill takes  less than 2 seconds.  Neurological:     Mental Status: She is alert and oriented to person, place, and time.     Coordination: Coordination normal.  Psychiatric:        Mood and Affect: Mood normal.        Behavior: Behavior normal.      ED Treatments / Results  Labs (all labs ordered are listed, but only abnormal results are displayed) Labs Reviewed  WET PREP, GENITAL - Abnormal; Notable for the following components:      Result Value   WBC, Wet Prep HPF POC MANY (*)    All other components within normal limits  COMPREHENSIVE METABOLIC PANEL - Abnormal; Notable for the following components:   Chloride 95 (*)    Creatinine, Ser 7.01 (*)    GFR calc non Af Amer 6 (*)    GFR calc Af Amer 7 (*)    All other components within normal limits  LIPASE, BLOOD - Abnormal; Notable for the following components:   Lipase 110 (*)    All other components within normal limits  CBC WITH DIFFERENTIAL/PLATELET - Abnormal; Notable for the following components:   RBC 3.63 (*)    Hemoglobin 11.5 (*)    MCV 103.3 (*)    All other components within normal limits  I-STAT BETA HCG BLOOD, ED ([facility], [facility], [facility] ONLY) - Abnormal; Notable for the following components:   I-stat hCG, quantitative 5.1 (*)    All other components within normal limits  URINALYSIS, ROUTINE W REFLEX MICROSCOPIC  RPR  HIV ANTIBODY (ROUTINE TESTING W REFLEX)  GC/CHLAMYDIA PROBE AMP ([place]) NOT AT [facility]     EKG None  Radiology US Transvaginal Non-ob  Result Date: 05/02/2018 CLINICAL DATA:  Left adnexal pain, lower abdominal pain EXAM: TRANSABDOMINAL AND TRANSVAGINAL ULTRASOUND OF PELVIS DOPPLER ULTRASOUND OF OVARIES TECHNIQUE: Both transabdominal and transvaginal ultrasound examinations of the pelvis were performed. Transabdominal technique was performed for global imaging of the pelvis including uterus, ovaries, adnexal regions, and pelvic cul-de-sac. It was necessary to proceed with endovaginal exam following the  transabdominal exam to visualize the CT abdomen 03/12/2018. Color and duplex Doppler ultrasound was utilized to evaluate blood flow to the ovaries. COMPARISON:  None. FINDINGS: Uterus  Measurements: 4.8 x 2.1 x 3.8 cm = volume: 19.9 mL. No fibroids or other mass visualized. Endometrium Thickness: 2.3 mm.  No focal abnormality visualized. Right ovary Measurements: 2.2 x 1.1 x 1.1 cm = volume: 1.1 mL. Normal appearance/no adnexal mass. Left ovary Measurements: 1.5 x 1.1 x 1 cm = volume: 0.7 mL. Normal appearance/no adnexal mass. Pulsed Doppler evaluation of both ovaries demonstrates normal low-resistance arterial and venous waveforms. Other findings Trace pelvic free fluid. IMPRESSION: 1. No acute pelvic abnormality. Electronically Signed   By: Kathreen Devoid   On: 05/02/2018 10:53   US Pelvis Complete  Result Date: 05/02/2018 CLINICAL DATA:  Left adnexal pain, lower abdominal pain EXAM: TRANSABDOMINAL AND TRANSVAGINAL ULTRASOUND OF PELVIS DOPPLER ULTRASOUND OF OVARIES TECHNIQUE: Both transabdominal and transvaginal ultrasound examinations of the pelvis were performed. Transabdominal technique was performed for global imaging of the pelvis including uterus, ovaries, adnexal regions, and pelvic cul-de-sac. It was necessary to proceed with endovaginal exam following the transabdominal exam to visualize the CT abdomen 03/12/2018. Color and duplex Doppler ultrasound was utilized to evaluate blood flow to the ovaries. COMPARISON:  None. FINDINGS: Uterus Measurements: 4.8 x 2.1 x 3.8 cm = volume: 19.9 mL. No fibroids or other mass visualized. Endometrium Thickness: 2.3 mm.  No focal abnormality visualized. Right ovary Measurements: 2.2 x 1.1 x 1.1 cm = volume: 1.1 mL. Normal appearance/no adnexal mass. Left ovary Measurements: 1.5 x 1.1 x 1 cm = volume: 0.7 mL. Normal appearance/no adnexal mass. Pulsed Doppler evaluation of both ovaries demonstrates normal low-resistance arterial and venous waveforms. Other findings Trace  pelvic free fluid. IMPRESSION: 1. No acute pelvic abnormality. Electronically Signed   By: Kathreen Devoid   On: 05/02/2018 10:53   [place] Art/ven Flow Abd Pelv Doppler  Result Date: 05/02/2018 CLINICAL DATA:  Left adnexal pain, lower abdominal pain EXAM: TRANSABDOMINAL AND TRANSVAGINAL ULTRASOUND OF PELVIS DOPPLER ULTRASOUND OF OVARIES TECHNIQUE: Both transabdominal and transvaginal ultrasound examinations of the pelvis were performed. Transabdominal technique was performed for global imaging of the pelvis including uterus, ovaries, adnexal regions, and pelvic cul-de-sac. It was necessary to proceed with endovaginal exam following the transabdominal exam to visualize the CT abdomen 03/12/2018. Color and duplex Doppler ultrasound was utilized to evaluate blood flow to the ovaries. COMPARISON:  None. FINDINGS: Uterus Measurements: 4.8 x 2.1 x 3.8 cm = volume: 19.9 mL. No fibroids or other mass visualized. Endometrium Thickness: 2.3 mm.  No focal abnormality visualized. Right ovary Measurements: 2.2 x 1.1 x 1.1 cm = volume: 1.1 mL. Normal appearance/no adnexal mass. Left ovary Measurements: 1.5 x 1.1 x 1 cm = volume: 0.7 mL. Normal appearance/no adnexal mass. Pulsed Doppler evaluation of both ovaries demonstrates normal low-resistance arterial and venous waveforms. Other findings Trace pelvic free fluid. IMPRESSION: 1. No acute pelvic abnormality. Electronically Signed   By: Kathreen Devoid   On: 05/02/2018 10:53    Procedures Procedures (including critical care time)  Medications Ordered in ED Medications  cefTRIAXone (ROCEPHIN) injection 250 mg (250 mg Intramuscular Given 05/02/18 1124)  doxycycline (VIBRA-TABS) tablet 100 mg (100 mg Oral Given 05/02/18 1123)  metroNIDAZOLE (FLAGYL) tablet 500 mg (500 mg Oral Given 05/02/18 1123)  lidocaine (PF) (XYLOCAINE) 1 % injection (1 mL  Given 05/02/18 1124)     Initial Impression / Assessment and Plan / ED Course  I have reviewed the triage vital signs and the  nursing notes.  Pertinent labs & imaging results that were available during my care of the patient were reviewed by me and  considered in my medical decision making (see chart for details).  Patient presents for evaluation of vaginal discharge and lower abdominal pain with nausea.  She is discharge.  On exam lower abdominal tenderness without guarding.  No signs of acute surgical abdomen.  Will get abdominal labs.  Pelvic exam reveals copious amounts of milky discharge.  Wet prep and STD testing collected.  Patient had a lot of discomfort during bimanual exam with left adnexal tenderness worse than right, will proceed with pelvic ultrasound to rule out ovarian torsion or tubo-ovarian abscess.  Labs overall reassuring no leukocytosis, stable hemoglobin, no acute electrolyte derangements requiring intervention, normal renal and liver function, lipase minimally elevated. Chart review reveals patient's lipase is slightly elevated regularly.  Wet prep reveals many WBCs, no clue cells to suggest BV.  I-STAT hCG is minimally elevated at 5.1 I feel this is likely causing positive given patient's tubal ligation and age.  Pelvic exam reveals no evidence of ovarian torsion, tubo-ovarian abscess or any other acute pelvic abnormalities.   Discussed results with patient, given the level of discomfort she experienced on bimanual exam and many WBCs present on wet prep will treat for PID, given Rocephin and doxycycline and Flagyl here in the emergency department and prescriptions to continue antibiotic regimen.  Patient is aware she has STD testing pending will be contacted in 2 to 3 days with any positive results.  Patient is aware she will need to notify any partners of positive results.  Return precautions discussed.  Patient follow-up with OB/GYN.  [place] for discharge home at this time expresses understanding and agreement with plan.   Final Clinical Impressions(s) / ED Diagnoses   Final diagnoses:  PID (pelvic  inflammatory disease)  Vaginal discharge  Lower abdominal pain    ED Discharge Orders         Ordered    doxycycline (VIBRAMYCIN) 100 MG capsule  2 times daily     05/02/18 1215    metroNIDAZOLE (FLAGYL) 500 MG tablet  2 times daily     05/02/18 1215            Janet Berlin 05/04/18 2153    Drenda Freeze, MD 05/06/18 [phone]

## 2018-05-02 NOTE — ED Notes (Signed)
Pt unable to provide urine sample at this time 

## 2018-05-02 NOTE — Discharge Instructions (Signed)
You are being treated for pelvic inflammatory disease, this is most commonly caused by chlamydia or gonorrhea.  You will need to take antibiotics daily as directed to help treat this.  Make sure you take these with food.  You will need to follow-up with OB/GYN for reevaluation.  If you start having worsening abdominal pain or vaginal discharge you can go to the MAU at [facility] or return to the emergency department.  You have STD testing for gonorrhea, chlamydia, HIV and syphilis pending and will be called by phone in 2 to 3 days with any positive results, you were already treated for gonorrhea and chlamydia today, but if you have any positive results you will need to notify any partners so they can be tested and treated as well.

## 2018-05-02 NOTE — ED Triage Notes (Signed)
Pt states she has been having abd cramping for a few days. Pt complains of "milky white" vaginal discharge. Pt had unprotected sex and is concerned she may have contracted something. Denies n/v.

## 2018-05-02 NOTE — ED Notes (Signed)
Pt transported to ultrasound.

## 2018-05-04 LAB — GC/CHLAMYDIA PROBE AMP (~~LOC~~) NOT AT ARMC
Chlamydia: NEGATIVE
NEISSERIA GONORRHEA: NEGATIVE

## 2018-05-30 ENCOUNTER — Observation Stay (HOSPITAL_COMMUNITY)
Admission: EM | Admit: 2018-05-30 | Discharge: 2018-05-31 | Disposition: A | Discharge status: Home / Self Care | Payer: Medicare Other | Attending: Family Medicine | Admitting: Family Medicine

## 2018-05-30 ENCOUNTER — Emergency Department (HOSPITAL_COMMUNITY): Payer: Medicare Other

## 2018-05-30 ENCOUNTER — Encounter (HOSPITAL_COMMUNITY): Payer: Self-pay | Admitting: Emergency Medicine

## 2018-05-30 DIAGNOSIS — E8779 Other fluid overload: Secondary | ICD-10-CM

## 2018-05-30 DIAGNOSIS — Z8674 Personal history of sudden cardiac arrest: Secondary | ICD-10-CM | POA: Diagnosis not present

## 2018-05-30 DIAGNOSIS — I160 Hypertensive urgency: Secondary | ICD-10-CM | POA: Diagnosis not present

## 2018-05-30 DIAGNOSIS — M1990 Unspecified osteoarthritis, unspecified site: Secondary | ICD-10-CM | POA: Diagnosis not present

## 2018-05-30 DIAGNOSIS — N186 End stage renal disease: Secondary | ICD-10-CM | POA: Diagnosis present

## 2018-05-30 DIAGNOSIS — G629 Polyneuropathy, unspecified: Secondary | ICD-10-CM | POA: Diagnosis not present

## 2018-05-30 DIAGNOSIS — Z992 Dependence on renal dialysis: Secondary | ICD-10-CM | POA: Diagnosis not present

## 2018-05-30 DIAGNOSIS — M329 Systemic lupus erythematosus, unspecified: Secondary | ICD-10-CM | POA: Insufficient documentation

## 2018-05-30 DIAGNOSIS — N189 Chronic kidney disease, unspecified: Secondary | ICD-10-CM

## 2018-05-30 DIAGNOSIS — J810 Acute pulmonary edema: Principal | ICD-10-CM | POA: Insufficient documentation

## 2018-05-30 DIAGNOSIS — E8770 Fluid overload, unspecified: Secondary | ICD-10-CM | POA: Diagnosis present

## 2018-05-30 DIAGNOSIS — F17210 Nicotine dependence, cigarettes, uncomplicated: Secondary | ICD-10-CM | POA: Insufficient documentation

## 2018-05-30 DIAGNOSIS — E875 Hyperkalemia: Secondary | ICD-10-CM | POA: Insufficient documentation

## 2018-05-30 DIAGNOSIS — D631 Anemia in chronic kidney disease: Secondary | ICD-10-CM

## 2018-05-30 DIAGNOSIS — J441 Chronic obstructive pulmonary disease with (acute) exacerbation: Secondary | ICD-10-CM | POA: Diagnosis present

## 2018-05-30 DIAGNOSIS — IMO0002 Reserved for concepts with insufficient information to code with codable children: Secondary | ICD-10-CM | POA: Diagnosis present

## 2018-05-30 DIAGNOSIS — J449 Chronic obstructive pulmonary disease, unspecified: Secondary | ICD-10-CM | POA: Insufficient documentation

## 2018-05-30 DIAGNOSIS — Z8673 Personal history of transient ischemic attack (TIA), and cerebral infarction without residual deficits: Secondary | ICD-10-CM | POA: Insufficient documentation

## 2018-05-30 DIAGNOSIS — N2581 Secondary hyperparathyroidism of renal origin: Secondary | ICD-10-CM | POA: Diagnosis not present

## 2018-05-30 DIAGNOSIS — Z79899 Other long term (current) drug therapy: Secondary | ICD-10-CM | POA: Diagnosis not present

## 2018-05-30 DIAGNOSIS — I120 Hypertensive chronic kidney disease with stage 5 chronic kidney disease or end stage renal disease: Secondary | ICD-10-CM | POA: Diagnosis not present

## 2018-05-30 DIAGNOSIS — N3941 Urge incontinence: Secondary | ICD-10-CM | POA: Insufficient documentation

## 2018-05-30 DIAGNOSIS — I10 Essential (primary) hypertension: Secondary | ICD-10-CM | POA: Diagnosis not present

## 2018-05-30 LAB — CBC
HCT: 29.3 % — ABNORMAL LOW (ref 36.0–46.0)
Hemoglobin: 9.1 g/dL — ABNORMAL LOW (ref 12.0–15.0)
MCH: 31.5 pg (ref 26.0–34.0)
MCHC: 31.1 g/dL (ref 30.0–36.0)
MCV: 101.4 fL — AB (ref 80.0–100.0)
NRBC: 0 % (ref 0.0–0.2)
Platelets: 141 10*3/uL — ABNORMAL LOW (ref 150–400)
RBC: 2.89 MIL/uL — AB (ref 3.87–5.11)
RDW: 16.5 % — ABNORMAL HIGH (ref 11.5–15.5)
WBC: 5.2 10*3/uL (ref 4.0–10.5)

## 2018-05-30 LAB — CBC WITH DIFFERENTIAL/PLATELET
Abs Immature Granulocytes: 0.02 10*3/uL (ref 0.00–0.07)
Basophils Absolute: 0 10*3/uL (ref 0.0–0.1)
Basophils Relative: 1 %
EOS PCT: 3 %
Eosinophils Absolute: 0.2 10*3/uL (ref 0.0–0.5)
HCT: 31.4 % — ABNORMAL LOW (ref 36.0–46.0)
Hemoglobin: 9 g/dL — ABNORMAL LOW (ref 12.0–15.0)
Immature Granulocytes: 0 %
Lymphocytes Relative: 26 %
Lymphs Abs: 1.4 10*3/uL (ref 0.7–4.0)
MCH: 30.4 pg (ref 26.0–34.0)
MCHC: 28.7 g/dL — ABNORMAL LOW (ref 30.0–36.0)
MCV: 106.1 fL — ABNORMAL HIGH (ref 80.0–100.0)
Monocytes Absolute: 0.7 10*3/uL (ref 0.1–1.0)
Monocytes Relative: 12 %
NRBC: 0 % (ref 0.0–0.2)
Neutro Abs: 3.1 10*3/uL (ref 1.7–7.7)
Neutrophils Relative %: 58 %
Platelets: 145 10*3/uL — ABNORMAL LOW (ref 150–400)
RBC: 2.96 MIL/uL — ABNORMAL LOW (ref 3.87–5.11)
RDW: 16.6 % — ABNORMAL HIGH (ref 11.5–15.5)
WBC: 5.5 10*3/uL (ref 4.0–10.5)

## 2018-05-30 LAB — RENAL FUNCTION PANEL
Albumin: 2.7 g/dL — ABNORMAL LOW (ref 3.5–5.0)
Anion gap: 10 (ref 5–15)
BUN: 78 mg/dL — ABNORMAL HIGH (ref 6–20)
CO2: 20 mmol/L — AB (ref 22–32)
Calcium: 8.9 mg/dL (ref 8.9–10.3)
Chloride: 106 mmol/L (ref 98–111)
Creatinine, Ser: 9.12 mg/dL — ABNORMAL HIGH (ref 0.44–1.00)
GFR calc Af Amer: 5 mL/min — ABNORMAL LOW (ref >60)
GFR calc non Af Amer: 4 mL/min — ABNORMAL LOW (ref >60)
Glucose, Bld: 83 mg/dL (ref 70–99)
Phosphorus: 4 mg/dL (ref 2.5–4.6)
Potassium: 5.6 mmol/L — ABNORMAL HIGH (ref 3.5–5.1)
Sodium: 136 mmol/L (ref 135–145)

## 2018-05-30 LAB — BASIC METABOLIC PANEL
Anion gap: 10 (ref 5–15)
BUN: 78 mg/dL — ABNORMAL HIGH (ref 6–20)
CO2: 22 mmol/L (ref 22–32)
CREATININE: 9.28 mg/dL — AB (ref 0.44–1.00)
Calcium: 9 mg/dL (ref 8.9–10.3)
Chloride: 106 mmol/L (ref 98–111)
GFR calc non Af Amer: 4 mL/min — ABNORMAL LOW (ref >60)
GFR, EST AFRICAN AMERICAN: 5 mL/min — AB (ref >60)
Glucose, Bld: 89 mg/dL (ref 70–99)
Potassium: 5.1 mmol/L (ref 3.5–5.1)
Sodium: 138 mmol/L (ref 135–145)

## 2018-05-30 MED ORDER — PENTAFLUOROPROP-TETRAFLUOROETH EX AERO
1.0000 "application " | INHALATION_SPRAY | CUTANEOUS | Status: DC | PRN
  Filled 2018-05-30: qty 116

## 2018-05-30 MED ORDER — LIDOCAINE-PRILOCAINE 2.5-2.5 % EX CREA
1.0000 "application " | TOPICAL_CREAM | CUTANEOUS | Status: DC | PRN
  Filled 2018-05-30: qty 5

## 2018-05-30 MED ORDER — CALCIUM ACETATE (PHOS BINDER) 667 MG PO CAPS
2001.0000 mg | ORAL_CAPSULE | Freq: Three times a day (TID) | ORAL | Status: DC
  Administered 2018-05-30 – 2018-05-31 (×3): 2001 mg via ORAL
  Filled 2018-05-30 (×3): qty 3

## 2018-05-30 MED ORDER — ACETAMINOPHEN 650 MG RE SUPP: 650.0000 mg | Freq: Four times a day (QID) | RECTAL | Status: DC | PRN

## 2018-05-30 MED ORDER — SODIUM CHLORIDE 0.9 % IV SOLN: 100.0000 mL | INTRAVENOUS | Status: DC | PRN

## 2018-05-30 MED ORDER — ONDANSETRON HCL 4 MG PO TABS: 4.0000 mg | ORAL_TABLET | Freq: Four times a day (QID) | ORAL | Status: DC | PRN

## 2018-05-30 MED ORDER — CHLORHEXIDINE GLUCONATE CLOTH 2 % EX PADS: 6.0000 | MEDICATED_PAD | Freq: Every day | CUTANEOUS | Status: DC

## 2018-05-30 MED ORDER — GABAPENTIN 300 MG PO CAPS
300.0000 mg | ORAL_CAPSULE | Freq: Every day | ORAL | Status: DC
  Administered 2018-05-30: 300 mg via ORAL
  Filled 2018-05-30: qty 1

## 2018-05-30 MED ORDER — IPRATROPIUM-ALBUTEROL 0.5-2.5 (3) MG/3ML IN SOLN: 3.0000 mL | RESPIRATORY_TRACT | Status: DC | PRN

## 2018-05-30 MED ORDER — LIDOCAINE HCL (PF) 1 % IJ SOLN: 5.0000 mL | INTRAMUSCULAR | Status: DC | PRN

## 2018-05-30 MED ORDER — HEPARIN SODIUM (PORCINE) 1000 UNIT/ML DIALYSIS
1000.0000 [IU] | INTRAMUSCULAR | Status: DC | PRN
  Filled 2018-05-30: qty 1

## 2018-05-30 MED ORDER — ACETAMINOPHEN 325 MG PO TABS
650.0000 mg | ORAL_TABLET | Freq: Four times a day (QID) | ORAL | Status: DC | PRN
  Administered 2018-05-30 (×2): 650 mg via ORAL
  Filled 2018-05-30 (×2): qty 2

## 2018-05-30 MED ORDER — ONDANSETRON HCL 4 MG/2ML IJ SOLN: 4.0000 mg | Freq: Four times a day (QID) | INTRAMUSCULAR | Status: DC | PRN

## 2018-05-30 MED ORDER — ALTEPLASE 2 MG IJ SOLR
2.0000 mg | Freq: Once | INTRAMUSCULAR | Status: DC | PRN
  Filled 2018-05-30: qty 2

## 2018-05-30 MED ORDER — HEPARIN SODIUM (PORCINE) 5000 UNIT/ML IJ SOLN
5000.0000 [IU] | Freq: Three times a day (TID) | INTRAMUSCULAR | Status: DC
  Administered 2018-05-30 – 2018-05-31 (×3): 5000 [IU] via SUBCUTANEOUS
  Filled 2018-05-30 (×2): qty 1

## 2018-05-30 NOTE — Procedures (Signed)
   I was present at this dialysis session, have reviewed the session itself and made  appropriate changes [name] MD [facility] pager [phone]   05/30/2018, 10:14 AM

## 2018-05-30 NOTE — H&P (Signed)
History and Physical    Kathleen [name] MSX:[id number] DOB: 12/26/1960 DOA: 05/30/2018  PCP: Angela Burke, NP  Patient coming from: Home Chief Complaint: SOB  HPI: Kathleen [name] is a 58 y.o. female with medical history significant of h/o TIA, Lupus, lupus vasculitis, HTN, ESRD, COPD who presents for SOB.  Kathleen [name] notes that she became SOB yesterday.  She was due to go to HD today, but was so SOB that she could not go and asked to be brought to the ED.  She is only taking phoslo right now and reports being told when she started dialysis to stop all of her medications.  She was also told this week to have 4 sessions of HD.  She is normally on Tu, Th, Sa.  She was asked to add a Wednesday session this week.  She did this, but then skipped Thursday because she felt like this was too much for her.  She completed a full session (per her report) on Saturday.  Finally, she reports using more salt, specifically culture salt recently and she notes this may be "too strong" for her.  She has further complaints, including recent urge incontinence which is accompanied by lower abdominal pain.  She reports that she urinates one or 2 times per day, about a cup full of urine.  She has no dysuria or change in smell.  She further notes tingling in her feet which is getting worse.  This is a chronic issue and she used to be on gabapentin for this.    ED Course: In the ED, she was noted to have an elevated K of 5.6.  BUN/Cr was 78 and 9.12.  She has chronic anemia which is macrocytic.  CXR showed pulmonary edema and she was placed on BIPAP with improvement in her SOB.  Nephrology was consulted and she was taken to HD.    Review of Systems: As per HPI otherwise 10 point review of systems negative.   Past Medical History:  Diagnosis Date  . Anemia   . Arthritis   . Cardiac arrest ([place])   . Cellulitis   . Cerebral vasculitis   . COPD (chronic obstructive pulmonary disease) ([place])   . Elevated antinuclear  antibody (ANA) level   . Endometriosis   . ESRD (end stage renal disease) on dialysis ([place])   . Gallstones   . Hypertension   . Lupus ([place])   . Migraine   . PONV (postoperative nausea and vomiting)   . Seizures ([place])   . Sepsis ([place])   . Suicide attempt ([place])   . TB (tuberculosis)   . Transient ischemic attack (TIA)   . Vasculitis of skin     Past Surgical History:  Procedure Laterality Date  . AV FISTULA PLACEMENT Left 07/04/2017   Procedure: ARTERIOVENOUS (AV) FISTULA CREATION;  Surgeon: Conrad Fairland, MD;  Location: [facility];  Service: Vascular;  Laterality: Left;  . CESAREAN SECTION    . ESOPHAGOGASTRODUODENOSCOPY N/A 07/15/2016   Procedure: ESOPHAGOGASTRODUODENOSCOPY (EGD);  Surgeon: Laurence Spates, MD;  Location: [facility] ENDOSCOPY;  Service: Endoscopy;  Laterality: N/A;  . INSERTION OF DIALYSIS CATHETER Right 07/04/2017   Procedure: INSERTION OF TUNNELED  DIALYSIS CATHETER - RIGHT INTERNAL JUGULAR PLACEMENT;  Surgeon: Conrad Glidden, MD;  Location: [facility];  Service: Vascular;  Laterality: Right;  . TUBAL LIGATION     Reviewed with patient, she is smoking 3-5 cigarettes per day, she tried to smoke when she felt SOB to see if this  would help.   reports that she has been smoking cigarettes. She has been smoking about 0.50 packs per day. She has never used smokeless tobacco. She reports that she does not drink alcohol or use drugs.  No Known Allergies  Reviewed with patient.  Family History  Problem Relation Age of Onset  . Ovarian cancer Mother   . Breast cancer Sister   . Throat cancer Brother     Prior to Admission medications   Medication Sig Start Date End Date Taking? Authorizing Provider  calcium acetate (PHOSLO) 667 MG capsule Take 2,001 mg by mouth 3 (three) times daily with meals.   Yes [provider]                                              Physical Exam:  Constitutional: NAD, comfortable Vitals:   05/30/18 0945 05/30/18 1000 05/30/18 1015 05/30/18  1030  BP: (!) 190/113 (!) 188/107 (!) 190/119 (!) 195/117  Pulse: 67 85 71 68  Resp: (!) 21 20 18 19   Temp:      TempSrc:      SpO2:       Eyes: lids and conjunctivae normal.  She has some mild yellowing of sclerae, appears chronic.  ENMT: Wearing bipap mask Neck: normal, supple Respiratory: Crackles to mid lung fields, no wheezing or rales.  Cardiovascular: RR, NR, no murmur Abdomen: + TTP in the suprapubic region, + BS, ND Musculoskeletal: no clubbing / cyanosis.Normal muscle tone.  She has a LUE fistula with a good thrill and bruit.  Skin: no rashes, lesions, ulcers on exposed skin Neurologic: CN 2-12 grossly intact. Strength 5/5 in all 4.  Psychiatric: Normal judgment and insight. Alert and oriented x 3. Normal mood.    Labs on Admission: I have personally reviewed following labs and imaging studies  CBC: Recent Labs  Lab 05/30/18 0630 05/30/18 0813  WBC 5.5 5.2  NEUTROABS 3.1  --   HGB 9.0* 9.1*  HCT 31.4* 29.3*  MCV 106.1* 101.4*  PLT 145* 092*   Basic Metabolic Panel: Recent Labs  Lab 05/30/18 0630 05/30/18 0813  NA 138 136  K 5.1 5.6*  CL 106 106  CO2 22 20*  GLUCOSE 89 83  BUN 78* 78*  CREATININE 9.28* 9.12*  CALCIUM 9.0 8.9  PHOS  --  4.0   GFR: CrCl cannot be calculated (Unknown ideal weight.). Liver Function Tests: Recent Labs  Lab 05/30/18 0813  ALBUMIN 2.7*   No results for input(s): LIPASE, AMYLASE in the last 168 hours. No results for input(s): AMMONIA in the last 168 hours. Coagulation Profile: No results for input(s): INR, PROTIME in the last 168 hours. Cardiac Enzymes: No results for input(s): CKTOTAL, CKMB, CKMBINDEX, TROPONINI in the last 168 hours. BNP (last 3 results) No results for input(s): PROBNP in the last 8760 hours. HbA1C: No results for input(s): HGBA1C in the last 72 hours. CBG: No results for input(s): GLUCAP in the last 168 hours. Lipid Profile: No results for input(s): CHOL, HDL, LDLCALC, TRIG, CHOLHDL, LDLDIRECT  in the last 72 hours. Thyroid Function Tests: No results for input(s): TSH, T4TOTAL, FREET4, T3FREE, THYROIDAB in the last 72 hours. Anemia Panel: No results for input(s): VITAMINB12, FOLATE, FERRITIN, TIBC, IRON, RETICCTPCT in the last 72 hours. Urine analysis:    Component Value Date/Time   COLORURINE STRAW (A) 06/29/2017 1428  APPEARANCEUR CLEAR 06/29/2017 1428   LABSPEC 1.009 06/29/2017 1428   PHURINE 6.0 06/29/2017 1428   GLUCOSEU NEGATIVE 06/29/2017 1428   HGBUR SMALL (A) 06/29/2017 1428   BILIRUBINUR NEGATIVE 06/29/2017 1428   KETONESUR NEGATIVE 06/29/2017 1428   PROTEINUR 100 (A) 06/29/2017 1428   NITRITE NEGATIVE 06/29/2017 1428   LEUKOCYTESUR NEGATIVE 06/29/2017 1428    Radiological Exams on Admission: Dg Chest Port 1 View  Result Date: 05/30/2018 CLINICAL DATA:  58 year old female with shortness of breath. EXAM: PORTABLE CHEST 1 VIEW COMPARISON:  07/11/2017 and earlier. FINDINGS: Portable AP semi upright view at 0623 hours. Tunneled right chest catheter has been removed since the prior study. Diffuse increased pulmonary interstitial markings, with basilar predominance. Small right pleural effusion suspected. Stable mild cardiomegaly and mediastinal contours. Visualized tracheal air column is within normal limits. No pneumothorax or consolidation. No acute osseous abnormality identified. IMPRESSION: Acute pulmonary edema suspected with small right pleural effusion. Viral/atypical respiratory infection less likely. Electronically Signed   By: Genevie Ann M.D.   On: 05/30/2018 06:47    EKG: Independently reviewed. NSR with likely some early peaking of T waves given K of 5.6  Assessment/Plan Pulmonary edema, hyperkalemia in the setting of ESRD - She is improved with BIPAP, able to speak in full sentences - Nephrology is following and will take her to HD today - Further BP and volume control pending HD    Accelerated Hypertension - BP is elevated, likely related to acute  volume - She is currently not taking anything for HTN, will need to monitor and assess if she needs medications on non HD days - Follow up nephrology recommendations  COPD (chronic obstructive pulmonary disease) - Reported in chart, she is a current smoker - PFTs from [facility] reviewed, she meets criteria as FEV1/FVC is < 0.7 (0.69) and her FEV1 is > 80%.  She would be classified as GOLD 1, mild disease - Duonebs PRN  Lupus - reported history - Not currently being treated with any disease modifying medications - Monitor  Peripheral neuropathy - Reports chronic tingling in feet, ankles and fingers - Possibly related to lupus - Will restart gabapentin dose to see if this helps  Urge incontinence - new onset - Check UA as she continues to make urine, given lower abdominal pain she may have a UTI - Follow fever curve - Trend WBC - No antibiotics unless UA abnormal or changes as noted.    DVT prophylaxis: Heparin SQ  Code Status: Full Disposition Plan: Admit for HD  Consults called: Nephrology Justin Mend) Admission status: Med Tele, Obs   Gilles Chiquito MD [facility] If 7PM-7AM, please contact night-coverage [email or web address]   05/30/2018, 10:51 AM

## 2018-05-30 NOTE — Progress Notes (Signed)
Pt transported on Bipap from ED to hemodialysis without complications.

## 2018-05-30 NOTE — ED Provider Notes (Addendum)
[facility] EMERGENCY DEPARTMENT Provider Note   CSN: [id number] Arrival date & time: 05/30/18  0602     History   Chief Complaint Chief Complaint  Patient presents with  . Respiratory Distress    HPI Kathleen [name] is a 58 y.o. female.  The history is provided by the patient.  She has history of hypertension, lupus, COPD, end-stage renal disease on hemodialysis and comes in because of difficulty breathing.  She gets dialysis every Tuesday-Thursday-Saturday and states that she had her normal dialysis session 3 days ago.  She started getting short of breath during the night and felt too breathless to go to her dialysis session in the transport.  She denies chest pain, heaviness, tightness, pressure.  She denies any cough.  She states that she does make some urine and did urinate a reasonable amount last night.  She also relates that she was told that she had to have extra dialysis sessions last week to get off extra fluid, but she is not clear what days she actually was dialyzed.  She is consistent that she definitely received her dialysis 3 days ago.  Past Medical History:  Diagnosis Date  . Anemia   . Arthritis   . Cardiac arrest ([place])   . Cellulitis   . Cerebral vasculitis   . COPD (chronic obstructive pulmonary disease) ([place])   . Elevated antinuclear antibody (ANA) level   . Endometriosis   . ESRD (end stage renal disease) on dialysis ([place])   . Gallstones   . Hypertension   . Lupus ([place])   . Migraine   . PONV (postoperative nausea and vomiting)   . Seizures ([place])   . Sepsis ([place])   . Suicide attempt ([place])   . TB (tuberculosis)   . Transient ischemic attack (TIA)   . Vasculitis of skin     Patient Active Problem List   Diagnosis Date Noted  . Syncope 10/08/2017  . Acute encephalopathy 07/11/2017  . ESRD (end stage renal disease) ([place]) 07/11/2017  . COPD (chronic obstructive pulmonary disease) ([place])   . Anemia   . Benign essential HTN  07/01/2017  . Stage 5 chronic kidney disease not on chronic dialysis ([place])   . Hypertensive crisis 06/29/2017  . Hypertensive emergency   . Lupus erythematosus   . Malnutrition of moderate degree 07/14/2016  . Lupus ([place]) 06/28/2016  . Hypertension 06/28/2016  . Kidney disease 06/28/2016  . Symptomatic anemia 06/28/2016  . Abdominal pain 06/28/2016  . Current non-adherence to medical treatment 06/28/2016  . Hypertensive urgency 06/28/2016  . SLE exacerbation ([place]) 06/28/2016  . Renal insufficiency     Past Surgical History:  Procedure Laterality Date  . AV FISTULA PLACEMENT Left 07/04/2017   Procedure: ARTERIOVENOUS (AV) FISTULA CREATION;  Surgeon: Conrad [place], MD;  Location: [facility];  Service: Vascular;  Laterality: Left;  . CESAREAN SECTION    . ESOPHAGOGASTRODUODENOSCOPY N/A 07/15/2016   Procedure: ESOPHAGOGASTRODUODENOSCOPY (EGD);  Surgeon: Laurence Spates, MD;  Location[facility] ENDOSCOPY;  Service: Endoscopy;  Laterality: N/A;  . INSERTION OF DIALYSIS CATHETER Right 07/04/2017   Procedure: INSERTION OF TUNNELED  DIALYSIS CATHETER - RIGHT INTERNAL JUGULAR PLACEMENT;  Surgeon: Conrad Magoffin, MD;  Location: [facility];  Service: Vascular;  Laterality: Right;  . TUBAL LIGATION       OB History   No obstetric history on file.      Home Medications    Prior to Admission medications   Medication Sig Start Date End Date  Taking? Authorizing Provider  felodipine (PLENDIL) 5 MG 24 hr tablet Take 2 tablets (10 mg total) by mouth daily. Patient not taking: Reported on 03/11/2018 07/13/17   Rai, Vernelle Emerald, MD  gabapentin (NEURONTIN) 300 MG capsule Take 1 capsule (300 mg total) by mouth daily. Patient not taking: Reported on 03/11/2018 07/13/17   Mendel Corning, MD  lidocaine-prilocaine (EMLA) cream Apply 1 application topically as needed (topical anesthesia for hemodialysis if Gebauers and Lidocaine injection are ineffective.). Patient not taking: Reported on 03/11/2018 07/07/17   Georgette Shell,  MD  multivitamin (RENA-VIT) TABS tablet Take 1 tablet by mouth at bedtime. Patient not taking: Reported on 03/11/2018 07/07/17   Georgette Shell, MD  mycophenolate (CELLCEPT) 500 MG tablet Take 2 tablets (1,000 mg total) by mouth 2 (two) times daily. Patient not taking: Reported on 03/11/2018 07/13/17   Rai, Vernelle Emerald, MD  sevelamer carbonate (RENVELA) 800 MG tablet Take 1 tablet (800 mg total) by mouth 3 (three) times daily with meals. Patient taking differently: Take 1,600 mg by mouth 3 (three) times daily with meals.  07/13/17   Mendel Corning, MD    Family History Family History  Problem Relation Age of Onset  . Ovarian cancer Mother   . Breast cancer Sister   . Throat cancer Brother     Social History Social History   Tobacco Use  . Smoking status: Current Some Day Smoker    Packs/day: 0.50    Types: Cigarettes  . Smokeless tobacco: Never Used  Substance Use Topics  . Alcohol use: No  . Drug use: No     Allergies   Patient has no known allergies.   Review of Systems Review of Systems  All other systems reviewed and are negative.    Physical Exam Updated Vital Signs BP (!) 179/131   Pulse 82   Resp (!) 21   SpO2 100%   Physical Exam Vitals signs and nursing note reviewed.    58 year old female, resting comfortably and in no acute distress. Vital signs are significant for elevated blood pressure and borderline elevated respiratory rate. Oxygen saturation is 100%, which is normal.  She is currently on BiPAP. Head is normocephalic and atraumatic. PERRLA, EOMI. Oropharynx is clear. Neck is nontender and supple without adenopathy or JVD. Back is nontender and there is no CVA tenderness. Lungs have diminished airflow throughout and bibasilar rales.  There are no wheezes or rhonchi. Chest is nontender. Heart has regular rate and rhythm without murmur. Abdomen is soft, flat, nontender without masses or hepatosplenomegaly and peristalsis is  normoactive. Extremities have no cyanosis or edema, full range of motion is present.  AV fistula is present in the left forearm with thrill present. Skin is warm and dry without rash. Neurologic: Mental status is normal, cranial nerves are intact, there are no motor or sensory deficits.  ED Treatments / Results  Labs (all labs ordered are listed, but only abnormal results are displayed) Labs Reviewed  BASIC METABOLIC PANEL - Abnormal; Notable for the following components:      Result Value   BUN 78 (*)    Creatinine, Ser 9.28 (*)    GFR calc non Af Amer 4 (*)    GFR calc Af Amer 5 (*)    All other components within normal limits  CBC WITH DIFFERENTIAL/PLATELET - Abnormal; Notable for the following components:   RBC 2.96 (*)    Hemoglobin 9.0 (*)    HCT 31.4 (*)  MCV 106.1 (*)    MCHC 28.7 (*)    RDW 16.6 (*)    Platelets 145 (*)    All other components within normal limits    EKG EKG Interpretation  Date/Time:  Tuesday May 30 2018 06:05:08 EST Ventricular Rate:  88 PR Interval:    QRS Duration: 93 QT Interval:  412 QTC Calculation: 499 R Axis:   79 Text Interpretation:  Sinus rhythm Borderline prolonged QT interval When compared with ECG of 03/11/2018, QT has shortened Confirmed by Delora Fuel ([id number]) on 05/30/2018 6:46:29 AM   Radiology Dg Chest Port 1 View  Result Date: 05/30/2018 CLINICAL DATA:  58 year old female with shortness of breath. EXAM: PORTABLE CHEST 1 VIEW COMPARISON:  07/11/2017 and earlier. FINDINGS: Portable AP semi upright view at 0623 hours. Tunneled right chest catheter has been removed since the prior study. Diffuse increased pulmonary interstitial markings, with basilar predominance. Small right pleural effusion suspected. Stable mild cardiomegaly and mediastinal contours. Visualized tracheal air column is within normal limits. No pneumothorax or consolidation. No acute osseous abnormality identified. IMPRESSION: Acute pulmonary edema suspected  with small right pleural effusion. Viral/atypical respiratory infection less likely. Electronically Signed   By: Genevie Ann M.D.   On: 05/30/2018 06:47    Procedures Procedures  CRITICAL CARE Performed by: Delora Fuel Total critical care time: 55 minutes Critical care time was exclusive of separately billable procedures and treating other patients. Critical care was necessary to treat or prevent imminent or life-threatening deterioration. Critical care was time spent personally by me on the following activities: development of treatment plan with patient and/or surrogate as well as nursing, discussions with consultants, evaluation of patient's response to treatment, examination of patient, obtaining history from patient or surrogate, ordering and performing treatments and interventions, ordering and review of laboratory studies, ordering and review of radiographic studies, pulse oximetry and re-evaluation of patient's condition.  Medications Ordered in ED Medications - No data to display   Initial Impression / Assessment and Plan / ED Course  I have reviewed the triage vital signs and the nursing notes.  Pertinent labs & imaging results that were available during my care of the patient were reviewed by me and considered in my medical decision making (see chart for details).  Acute respiratory distress secondary to pulmonary edema.  Chest x-ray is obtained showing mild pulmonary edema.  ECG shows no acute process.  Labs show renal failure, anemia which is stable.  Case has been discussed with Dr. Justin Mend of nephrology service who states he will do emergent dialysis, requests patient be admitted to the hospitalists service.  Case is discussed with Dr. Daryll Drown of [facility], who agrees to admit the patient.  Final Clinical Impressions(s) / ED Diagnoses   Final diagnoses:  Acute pulmonary edema ([place])  End-stage renal disease on hemodialysis ([place])  Hypertensive urgency  Anemia associated  with chronic renal failure    ED Discharge Orders    None       Delora Fuel, MD 74/08/14 [id number]    Delora Fuel, MD 56/31/49 [phone]

## 2018-05-30 NOTE — Progress Notes (Signed)
New Admission Note:   Arrival Method: stretcher from HD  Mental Orientation: alert and oriented x4  Telemetry: Box: 06 NSR Assessment: Completed Skin: Intact IV: RAC NSL Pain: 2 (0-10) Tubes: None Safety Measures: Safety Fall Prevention Plan has been discussed  Admission:  5 [place] Orientation: Patient has been orientated to the room, unit and staff.   Family: NA  Orders to be reviewed and implemented. Will continue to monitor the patient. Call light has been placed within reach and bed alarm has been activated.   Baldo Ash, RN

## 2018-05-30 NOTE — ED Notes (Signed)
MD at bedside. 

## 2018-05-30 NOTE — ED Triage Notes (Signed)
Pt arrives by gems from home where see called out due to respiratory distress- pt placed on  CPAP with ems. Ems gave 1 SL nitro. BP was 210 with ems initially.

## 2018-05-30 NOTE — Consult Note (Signed)
Renal Service Consult Note [facility]  Kathleen [name] 05/30/2018 Kathleen [name] Requesting Physician:  Dr Daryll Drown  Reason for Consult:  ESRD pt w/ resp distress HPI: The patient is a 58 y.o. year-old w/ hx of SLE, HTN and ESRD on HD since 07/2017 presented to ED with SOB and resp distress.  Placed on CPAP per EMS and got SL ntg x 1. BP was high in ED. CXR w/ ++ edema, we are asked to see for HD.    Pt states she was supposed to do HD 3 days in a row last week, but she missed the Thursday HD and didn't go until Sat.  No fevers, prod cough or CP.    ROS  denies CP  no joint pain   no HA  no blurry vision  no rash  no diarrhea  no nausea/ vomiting   Past Medical History  Past Medical History:  Diagnosis Date  . Anemia   . Arthritis   . Cardiac arrest ([place])   . Cellulitis   . Cerebral vasculitis   . COPD (chronic obstructive pulmonary disease) ([place])   . Elevated antinuclear antibody (ANA) level   . Endometriosis   . ESRD (end stage renal disease) on dialysis ([place])   . Gallstones   . Hypertension   . Lupus ([place])   . Migraine   . PONV (postoperative nausea and vomiting)   . Seizures ([place])   . Sepsis ([place])   . Suicide attempt ([place])   . TB (tuberculosis)   . Transient ischemic attack (TIA)   . Vasculitis of skin    Past Surgical History  Past Surgical History:  Procedure Laterality Date  . AV FISTULA PLACEMENT Left 07/04/2017   Procedure: ARTERIOVENOUS (AV) FISTULA CREATION;  Surgeon: Conrad West Ishpeming, MD;  Location: [place];  Service: Vascular;  Laterality: Left;  . CESAREAN SECTION    . ESOPHAGOGASTRODUODENOSCOPY N/A 07/15/2016   Procedure: ESOPHAGOGASTRODUODENOSCOPY (EGD);  Surgeon: Laurence Spates, MD;  Location: [facility] ENDOSCOPY;  Service: Endoscopy;  Laterality: N/A;  . INSERTION OF DIALYSIS CATHETER Right 07/04/2017   Procedure: INSERTION OF TUNNELED  DIALYSIS CATHETER - RIGHT INTERNAL JUGULAR PLACEMENT;  Surgeon: Conrad Cairo, MD;  Location: [place];   Service: Vascular;  Laterality: Right;  . TUBAL LIGATION     Family History  Family History  Problem Relation Age of Onset  . Ovarian cancer Mother   . Breast cancer Sister   . Throat cancer Brother    Social History  reports that she has been smoking cigarettes. She has been smoking about 0.50 packs per day. She has never used smokeless tobacco. She reports that she does not drink alcohol or use drugs. Allergies No Known Allergies Home medications Prior to Admission medications   Medication Sig Start Date End Date Taking? Authorizing Provider  calcium acetate (PHOSLO) 667 MG capsule Take 2,001 mg by mouth 3 (three) times daily with meals.   Yes [provider]  felodipine (PLENDIL) 5 MG 24 hr tablet Take 2 tablets (10 mg total) by mouth daily. Patient not taking: Reported on 03/11/2018 07/13/17   Rai, Vernelle Emerald, MD  gabapentin (NEURONTIN) 300 MG capsule Take 1 capsule (300 mg total) by mouth daily. Patient not taking: Reported on 03/11/2018 07/13/17   Mendel Corning, MD  lidocaine-prilocaine (EMLA) cream Apply 1 application topically as needed (topical anesthesia for hemodialysis if Gebauers and Lidocaine injection are ineffective.). Patient not taking: Reported on 03/11/2018 07/07/17   Georgette Shell,  MD  multivitamin (RENA-VIT) TABS tablet Take 1 tablet by mouth at bedtime. Patient not taking: Reported on 03/11/2018 07/07/17   Georgette Shell, MD  mycophenolate (CELLCEPT) 500 MG tablet Take 2 tablets (1,000 mg total) by mouth 2 (two) times daily. Patient not taking: Reported on 03/11/2018 07/13/17   Rai, Vernelle Emerald, MD  sevelamer carbonate (RENVELA) 800 MG tablet Take 1 tablet (800 mg total) by mouth 3 (three) times daily with meals. Patient not taking: Reported on 05/30/2018 07/13/17   Mendel Corning, MD   Liver Function Tests Recent Labs  Lab 05/30/18 0813  ALBUMIN 2.7*   No results for input(s): LIPASE, AMYLASE in the last 168 hours. CBC Recent Labs  Lab  05/30/18 0630 05/30/18 0813  WBC 5.5 5.2  NEUTROABS 3.1  --   HGB 9.0* 9.1*  HCT 31.4* 29.3*  MCV 106.1* 101.4*  PLT 145* 569*   Basic Metabolic Panel Recent Labs  Lab 05/30/18 0630 05/30/18 0813  NA 138 136  K 5.1 5.6*  CL 106 106  CO2 22 20*  GLUCOSE 89 83  BUN 78* 78*  CREATININE 9.28* 9.12*  CALCIUM 9.0 8.9  PHOS  --  4.0   Iron/TIBC/Ferritin/ %Sat    Component Value Date/Time   IRON 25 (L) 06/29/2017 2320   TIBC 193 (L) 06/29/2017 2320   FERRITIN 290 06/29/2017 2320   IRONPCTSAT 13 06/29/2017 2320    Vitals:   05/30/18 0900 05/30/18 0915 05/30/18 0930 05/30/18 0945  BP: (!) 181/113 (!) 175/112 (!) 202/116 (!) 190/113  Pulse: 66 75 74 67  Resp: (!) 21 (!) 21 17 (!) 21  Temp:      TempSrc:      SpO2:       Exam Gen on HD, on bipap, not in distress, able to speak w/o difficulty No rash, cyanosis or gangrene Sclera anicteric, throat not seen  No jvd or bruits Chest clear bilat to bases no rales RRR no MRG Abd soft ntnd no mass or ascites +bs MS no joint effusions or deformity Ext no LE or UE edema, no wounds or ulcers  Neuro is alert, Ox 3 , nf LUA AVF+bruit    Home meds:  - felodipine 10 qd  - mycophenolate 1gm bid  - calc acetate 3 tid ac/ sevelamer carb 1 tid ac  - gabapentin 300 qd   Dialysis: [name] 3[facility]5h  400/1.5  48.5kg   2/2 bath  LUA AVF  Hep 3600 - mircera 50 ug - calcitriol 1.25 ug tiw    Assessment: 1. Acute pulm edema 2. Resp distress on bipap 3. ESRD on HD 4. Vol overload 5. Anemia ckd 6. SLE on cellcept 7. HTN     P: 1. Acute HD underway, max UF as tolerated.  Discussed importance of significant fluid restriction at length.  Reassess after HD.        [name] MD [employer] pager [phone]   05/30/2018, 10:05 AM

## 2018-05-30 NOTE — ED Notes (Signed)
Report given to [facility], dialysis.

## 2018-05-31 DIAGNOSIS — J810 Acute pulmonary edema: Secondary | ICD-10-CM

## 2018-05-31 DIAGNOSIS — Z992 Dependence on renal dialysis: Secondary | ICD-10-CM

## 2018-05-31 DIAGNOSIS — N186 End stage renal disease: Secondary | ICD-10-CM

## 2018-05-31 LAB — CBC
HCT: 32.7 % — ABNORMAL LOW (ref 36.0–46.0)
Hemoglobin: 10.4 g/dL — ABNORMAL LOW (ref 12.0–15.0)
MCH: 31.4 pg (ref 26.0–34.0)
MCHC: 31.8 g/dL (ref 30.0–36.0)
MCV: 98.8 fL (ref 80.0–100.0)
Platelets: 173 10*3/uL (ref 150–400)
RBC: 3.31 MIL/uL — ABNORMAL LOW (ref 3.87–5.11)
RDW: 16.3 % — ABNORMAL HIGH (ref 11.5–15.5)
WBC: 5.3 10*3/uL (ref 4.0–10.5)
nRBC: 0 % (ref 0.0–0.2)

## 2018-05-31 LAB — BASIC METABOLIC PANEL
Anion gap: 12 (ref 5–15)
BUN: 35 mg/dL — ABNORMAL HIGH (ref 6–20)
CO2: 25 mmol/L (ref 22–32)
Calcium: 9.3 mg/dL (ref 8.9–10.3)
Chloride: 98 mmol/L (ref 98–111)
Creatinine, Ser: 6.06 mg/dL — ABNORMAL HIGH (ref 0.44–1.00)
GFR calc non Af Amer: 7 mL/min — ABNORMAL LOW (ref >60)
GFR, EST AFRICAN AMERICAN: 8 mL/min — AB (ref >60)
Glucose, Bld: 84 mg/dL (ref 70–99)
Potassium: 4.2 mmol/L (ref 3.5–5.1)
Sodium: 135 mmol/L (ref 135–145)

## 2018-05-31 MED ORDER — HYDROXYCHLOROQUINE SULFATE 200 MG PO TABS: 200.0000 mg | ORAL_TABLET | Freq: Every day | ORAL | 11 refills | Status: AC

## 2018-05-31 MED ORDER — AMLODIPINE BESYLATE 5 MG PO TABS: 5.0000 mg | ORAL_TABLET | Freq: Every day | ORAL | Status: DC

## 2018-05-31 MED ORDER — AMLODIPINE BESYLATE 5 MG PO TABS: 5.0000 mg | ORAL_TABLET | Freq: Every day | ORAL | 3 refills | Status: DC

## 2018-05-31 NOTE — Progress Notes (Addendum)
[name] Progress Note   Dialysis Orders: [name] 3[facility]5h  400/1.5  58.5kg   2/2 bath  LUA AVF  Hep 3600 - mircera 50 ug - calcitriol 1.25 ug tiw  Assessment/ Plan: 1. Acute pulmonary edema due to volume excess. - net UF HD 4 L - much better today - large volume given diminutive size post wt - pre weight was 52.5 - so post wt would have ben at EDW 2. ESRD -TTS - hyperkalemic on admission K down to 4.2 3. Anemia - hgb 10.4 - continue outpt ESA 4. Secondary hyperparathyroidism - on calcitriol/binders 5. HTN/volume - still quite hypertensive even with volume reduction- started on amlodipine at home - - resume 5 mg - apparently not taking per outpt MD note - resume - I think we can challenge volume more as well and lower edw further at d/c 6. Nutrition - will have outpatient RD follow up with fluid restriction education. 7. Disp - d/c today  Kathleen Jacobson, PA-C [facility] [phone] 05/31/2018,9:35 AM  LOS: 0 days   Pt seen, examined and agree w A/P as above.  Kathleen Splinter MD [employer] pager [phone]   05/31/2018, 10:47 AM    Subjective:   No c/o Breathing much better. Objective Vitals:   05/30/18 2038 05/30/18 2219 05/31/18 0518 05/31/18 0758  BP: (!) 172/127 (!) 153/104 (!) 166/88 (!) 169/119  Pulse: 99 80 79 80  Resp: 18  18 18   Temp: 98.8 F (37.1 C)  98.4 F (36.9 C) 98 F (36.7 C)  TempSrc: Oral   Oral  SpO2: 95%  98% 100%  Weight: 53.8 kg     Height:       Physical Exam General: NAD sitting on side of the bed Heart: RRR Lungs: no rales Abdomen: soft NT Extremities: tr LE edema Dialysis Access: left AVF + bruit   Additional Objective Labs: Basic Metabolic Panel: Recent Labs  Lab 05/30/18 0630 05/30/18 0813 05/31/18 0345  NA 138 136 135  K 5.1 5.6* 4.2  CL 106 106 98  CO2 22 20* 25  GLUCOSE 89 83 84  BUN 78* 78* 35*  CREATININE 9.28* 9.12* 6.06*  CALCIUM 9.0 8.9 9.3  PHOS  --  4.0  --     Liver Function Tests: Recent Labs  Lab 05/30/18 0813  ALBUMIN 2.7*   No results for input(s): LIPASE, AMYLASE in the last 168 hours. CBC: Recent Labs  Lab 05/30/18 0630 05/30/18 0813 05/31/18 0345  WBC 5.5 5.2 5.3  NEUTROABS 3.1  --   --   HGB 9.0* 9.1* 10.4*  HCT 31.4* 29.3* 32.7*  MCV 106.1* 101.4* 98.8  PLT 145* 141* 173   Blood Culture    Component Value Date/Time   SDES BLOOD RIGHT ANTECUBITAL 07/11/2017 0549   SPECREQUEST IN PEDIATRIC BOTTLE Blood Culture adequate volume 07/11/2017 0549   CULT  07/11/2017 0549    NO GROWTH 5 DAYS Performed at [facility], [place] [address], [place], [place] [address]    REPTSTATUS 07/16/2017 FINAL 07/11/2017 0549    Cardiac Enzymes: No results for input(s): CKTOTAL, CKMB, CKMBINDEX, TROPONINI in the last 168 hours. CBG: No results for input(s): GLUCAP in the last 168 hours. Iron Studies: No results for input(s): IRON, TIBC, TRANSFERRIN, FERRITIN in the last 72 hours. Lab Results  Component Value Date   INR 1.04 07/04/2017   INR 1.06 07/12/2016   INR 1.42 06/28/2016   Studies/Results: Dg Chest Port 1  View  Result Date: 05/30/2018 CLINICAL DATA:  57 year old female with shortness of breath. EXAM: PORTABLE CHEST 1 VIEW COMPARISON:  07/11/2017 and earlier. FINDINGS: Portable AP semi upright view at 0623 hours. Tunneled right chest catheter has been removed since the prior study. Diffuse increased pulmonary interstitial markings, with basilar predominance. Small right pleural effusion suspected. Stable mild cardiomegaly and mediastinal contours. Visualized tracheal air column is within normal limits. No pneumothorax or consolidation. No acute osseous abnormality identified. IMPRESSION: Acute pulmonary edema suspected with small right pleural effusion. Viral/atypical respiratory infection less likely. Electronically Signed   By: Genevie Ann M.D.   On: 05/30/2018 06:47   Medications: . sodium chloride    . sodium chloride     .  calcium acetate  2,001 mg Oral TID WC  . Chlorhexidine Gluconate Cloth  6 each Topical Q0600  . gabapentin  300 mg Oral QHS  . heparin  5,000 Units Subcutaneous Q8H

## 2018-05-31 NOTE — Care Management Obs Status (Signed)
[place] NOTIFICATION   Patient Details  Name: Kathleen [name] MRN: [id number] Date of Birth: Feb 16, 1961   Medicare Observation Status Notification Given:  Yes    Bartholomew Crews, RN 05/31/2018, 8:58 AM

## 2018-05-31 NOTE — Clinical Social Work Note (Signed)
Patient medically stable for discharge and request for cab voucher made. Per patient, she is unable to walk the distance from the bus stop to her home. Cab voucher provided. CSW signing off as no other SW intervention services needed.  Colman Birdwell Givens, MSW, LCSW Licensed Clinical Social Worker [place] [phone]

## 2018-05-31 NOTE — Progress Notes (Signed)
Patient discharged to home. Patient AVS reviewed and signed. Patient capable re-verbalizing medications and follow-up appointments. IV removed. Patient belongings sent with patient. Patient educated to return to the ED in the event of SOB, chest pain or dizziness.   Eniya Cannady B. RN 

## 2018-05-31 NOTE — Care Management Note (Signed)
Case Management Note Manya Silvas, RN MSN CCM Transitions of Care [address] [place] [phone]  Patient Details  Name: Kathleen [name] MRN: [id number] Date of Birth: 08/02/1960  Subjective/Objective:    Volume overload            Action/Plan: PTA home with roommate. Spoke with patient at bedside. Pt up independent in the room. States that she is on waiting list for section 8 housing-states has been on list since last year. Her roommate (former boyfriend) is currently in 55 day rehab-since mid December. Pt states that she needs to take care of herself and that living in that lifestyle situation is not good for her. Verbalizes that she is not in an abusive relationship, but doesn't feel safe because her medical condition requires a better lifestyle situation. Discussed cause of her volume overload. Pt reports that she has been eating and drinking without measuring, and reports feeling depressed about her living situation, but states she knows she needs to put herself first. Discussed concerns about getting medications-pt states that she was on about 15 medications, but these were stopped when dialysis was initiated. Discussed transportation home. States that she has no money and no one to pick her up. CM asked patient about riding the bus. Pt states that the bus stop close to her home is a "good walk," and then requests cab voucher. CSW informed of pt request. Pt anticipated to transition home today.  Expected Discharge Date:  05/31/18               Expected Discharge Plan:  Home/Self Care  In-House Referral:  Clinical Social Work  Discharge planning Services  CM Consult  Post Acute Care Choice:  NA Choice offered to:  NA  DME Arranged:  N/A DME Agency:  NA  HH Arranged:  NA HH Agency:  NA  Status of Service:  Completed, signed off  If discussed at [employer] of Stay Meetings, dates discussed:    Additional Comments:  Bartholomew Crews, RN 05/31/2018, 11:40 AM

## 2018-05-31 NOTE — Discharge Summary (Addendum)
Physician Discharge Summary  Kathleen [name] TUU:[id number] DOB: 1960/08/05 DOA: 05/30/2018  PCP: Angela Burke, NP  Admit date: 05/30/2018 Discharge date: 05/31/2018  Time spent: 50 minutes  Recommendations for Outpatient Follow-up:  Follow-up PCP in 2 weeks. Continue outpatient hemodialysis Tuesday Thursday and Saturday.   Discharge Diagnoses:  Active Problems:   Lupus ([place])   Hypertension   Hypertensive urgency   COPD (chronic obstructive pulmonary disease) (H[facility])   ESRD (end stage renal disease) ([facility])   Volume overload   Discharge Condition: Stable  Diet recommendation: Low-salt diet  Filed Weights   05/30/18 1205 05/30/18 1321 05/30/18 2038  Weight: 52.5 kg 53.8 kg 53.8 kg    History of present illness:  58 year old female with a history of TIA, lupus, hypertension, ESRD, COPD came with shortness of breath.  Patient was found to have pulmonary edema.  Nephrology was consulted for hemodialysis.  Hospital Course:   Pulmonary edema in the setting of ESRD-resolved, patient underwent hemodialysis yesterday.  Denies shortness of breath at this time.  She will get hemodialysis as scheduled as outpatient.  ESRD-on hemodialysis Tuesday Thursday and Saturday.  She was seen by nephrology.  Underwent hemodialysis yesterday.  Nephrology has signed off.  Continue home medications.  History of lupus-review of records from [facility] reveals that patient was started on Plaquenil.  She is not taking CellCept at this time.  We will continue with Plaquenil 200 mg p.o. daily.  Peripheral neuropathy-continue gabapentin  Hypertension-patient was started on amlodipine 5 mg daily.  We will continue with amlodipine 5 mg daily as per nephrology recommendation.  ? urge incontinence-patient denies any symptoms, UA was not obtained at the time of admission.  Consultations:  Nephrology  Discharge Exam: Vitals:   05/31/18 0518 05/31/18 0758  BP: (!) 166/88 (!) 169/119  Pulse: 79 80   Resp: 18 18  Temp: 98.4 F (36.9 C) 98 F (36.7 C)  SpO2: 98% 100%    General: Appears in no acute distress Cardiovascular: S1-S2, regular Respiratory: Clear to auscultation bilaterally  Discharge Instructions   Discharge Instructions    Diet - low sodium heart healthy   Complete by:  As directed    Increase activity slowly   Complete by:  As directed      Allergies as of 05/31/2018   No Known Allergies     Medication List    STOP taking these medications   mycophenolate 500 MG tablet Commonly known as:  CELLCEPT   sevelamer carbonate 800 MG tablet Commonly known as:  RENVELA     TAKE these medications   amLODipine 5 MG tablet Commonly known as:  NORVASC Take 1 tablet (5 mg total) by mouth at bedtime.   calcium acetate 667 MG capsule Commonly known as:  PHOSLO Take 2,001 mg by mouth 3 (three) times daily with meals.   felodipine 5 MG 24 hr tablet Commonly known as:  PLENDIL Take 2 tablets (10 mg total) by mouth daily.   gabapentin 300 MG capsule Commonly known as:  NEURONTIN Take 1 capsule (300 mg total) by mouth daily.   hydroxychloroquine 200 MG tablet Commonly known as:  PLAQUENIL Take 1 tablet (200 mg total) by mouth daily.   lidocaine-prilocaine cream Commonly known as:  EMLA Apply 1 application topically as needed (topical anesthesia for hemodialysis if Gebauers and Lidocaine injection are ineffective.).   multivitamin Tabs tablet Take 1 tablet by mouth at bedtime.      No Known Allergies    The results of  significant diagnostics from this hospitalization (including imaging, microbiology, ancillary and laboratory) are listed below for reference.    Significant Diagnostic Studies: US Transvaginal Non-ob  Result Date: 05/02/2018 CLINICAL DATA:  Left adnexal pain, lower abdominal pain EXAM: TRANSABDOMINAL AND TRANSVAGINAL ULTRASOUND OF PELVIS DOPPLER ULTRASOUND OF OVARIES TECHNIQUE: Both transabdominal and transvaginal ultrasound  examinations of the pelvis were performed. Transabdominal technique was performed for global imaging of the pelvis including uterus, ovaries, adnexal regions, and pelvic cul-de-sac. It was necessary to proceed with endovaginal exam following the transabdominal exam to visualize the CT abdomen 03/12/2018. Color and duplex Doppler ultrasound was utilized to evaluate blood flow to the ovaries. COMPARISON:  None. FINDINGS: Uterus Measurements: 4.8 x 2.1 x 3.8 cm = volume: 19.9 mL. No fibroids or other mass visualized. Endometrium Thickness: 2.3 mm.  No focal abnormality visualized. Right ovary Measurements: 2.2 x 1.1 x 1.1 cm = volume: 1.1 mL. Normal appearance/no adnexal mass. Left ovary Measurements: 1.5 x 1.1 x 1 cm = volume: 0.7 mL. Normal appearance/no adnexal mass. Pulsed Doppler evaluation of both ovaries demonstrates normal low-resistance arterial and venous waveforms. Other findings Trace pelvic free fluid. IMPRESSION: 1. No acute pelvic abnormality. Electronically Signed   By: Kathreen Devoid   On: 05/02/2018 10:53   US Pelvis Complete  Result Date: 05/02/2018 CLINICAL DATA:  Left adnexal pain, lower abdominal pain EXAM: TRANSABDOMINAL AND TRANSVAGINAL ULTRASOUND OF PELVIS DOPPLER ULTRASOUND OF OVARIES TECHNIQUE: Both transabdominal and transvaginal ultrasound examinations of the pelvis were performed. Transabdominal technique was performed for global imaging of the pelvis including uterus, ovaries, adnexal regions, and pelvic cul-de-sac. It was necessary to proceed with endovaginal exam following the transabdominal exam to visualize the CT abdomen 03/12/2018. Color and duplex Doppler ultrasound was utilized to evaluate blood flow to the ovaries. COMPARISON:  None. FINDINGS: Uterus Measurements: 4.8 x 2.1 x 3.8 cm = volume: 19.9 mL. No fibroids or other mass visualized. Endometrium Thickness: 2.3 mm.  No focal abnormality visualized. Right ovary Measurements: 2.2 x 1.1 x 1.1 cm = volume: 1.1 mL. Normal  appearance/no adnexal mass. Left ovary Measurements: 1.5 x 1.1 x 1 cm = volume: 0.7 mL. Normal appearance/no adnexal mass. Pulsed Doppler evaluation of both ovaries demonstrates normal low-resistance arterial and venous waveforms. Other findings Trace pelvic free fluid. IMPRESSION: 1. No acute pelvic abnormality. Electronically Signed   By: Kathreen Devoid   On: 05/02/2018 10:53   [place] Art/ven Flow Abd Pelv Doppler  Result Date: 05/02/2018 CLINICAL DATA:  Left adnexal pain, lower abdominal pain EXAM: TRANSABDOMINAL AND TRANSVAGINAL ULTRASOUND OF PELVIS DOPPLER ULTRASOUND OF OVARIES TECHNIQUE: Both transabdominal and transvaginal ultrasound examinations of the pelvis were performed. Transabdominal technique was performed for global imaging of the pelvis including uterus, ovaries, adnexal regions, and pelvic cul-de-sac. It was necessary to proceed with endovaginal exam following the transabdominal exam to visualize the CT abdomen 03/12/2018. Color and duplex Doppler ultrasound was utilized to evaluate blood flow to the ovaries. COMPARISON:  None. FINDINGS: Uterus Measurements: 4.8 x 2.1 x 3.8 cm = volume: 19.9 mL. No fibroids or other mass visualized. Endometrium Thickness: 2.3 mm.  No focal abnormality visualized. Right ovary Measurements: 2.2 x 1.1 x 1.1 cm = volume: 1.1 mL. Normal appearance/no adnexal mass. Left ovary Measurements: 1.5 x 1.1 x 1 cm = volume: 0.7 mL. Normal appearance/no adnexal mass. Pulsed Doppler evaluation of both ovaries demonstrates normal low-resistance arterial and venous waveforms. Other findings Trace pelvic free fluid. IMPRESSION: 1. No acute pelvic abnormality. Electronically Signed   By: Elbert Ewings  Patel   On: 05/02/2018 10:53   Dg Chest Port 1 View  Result Date: 05/30/2018 CLINICAL DATA:  58 year old female with shortness of breath. EXAM: PORTABLE CHEST 1 VIEW COMPARISON:  07/11/2017 and earlier. FINDINGS: Portable AP semi upright view at 0623 hours. Tunneled right chest catheter has  been removed since the prior study. Diffuse increased pulmonary interstitial markings, with basilar predominance. Small right pleural effusion suspected. Stable mild cardiomegaly and mediastinal contours. Visualized tracheal air column is within normal limits. No pneumothorax or consolidation. No acute osseous abnormality identified. IMPRESSION: Acute pulmonary edema suspected with small right pleural effusion. Viral/atypical respiratory infection less likely. Electronically Signed   By: Genevie Ann M.D.   On: 05/30/2018 06:47    Microbiology: No results found for this or any previous visit (from the past 240 hour(s)).   Labs: Basic Metabolic Panel: Recent Labs  Lab 05/30/18 0630 05/30/18 0813 05/31/18 0345  NA 138 136 135  K 5.1 5.6* 4.2  CL 106 106 98  CO2 22 20* 25  GLUCOSE 89 83 84  BUN 78* 78* 35*  CREATININE 9.28* 9.12* 6.06*  CALCIUM 9.0 8.9 9.3  PHOS  --  4.0  --    Liver Function Tests: Recent Labs  Lab 05/30/18 0813  ALBUMIN 2.7*   No results for input(s): LIPASE, AMYLASE in the last 168 hours. No results for input(s): AMMONIA in the last 168 hours. CBC: Recent Labs  Lab 05/30/18 0630 05/30/18 0813 05/31/18 0345  WBC 5.5 5.2 5.3  NEUTROABS 3.1  --   --   HGB 9.0* 9.1* 10.4*  HCT 31.4* 29.3* 32.7*  MCV 106.1* 101.4* 98.8  PLT 145* 141* 173   Cardiac Enzymes: No results for input(s): CKTOTAL, CKMB, CKMBINDEX, TROPONINI in the last 168 hours. BNP: BNP (last 3 results) Recent Labs    06/29/17 1057  BNP 923.5*     Signed:  Oswald Hillock MD.  [facility] 05/31/2018, 11:02 AM

## 2018-06-06 DIAGNOSIS — E162 Hypoglycemia, unspecified: Secondary | ICD-10-CM | POA: Insufficient documentation

## 2018-09-30 ENCOUNTER — Encounter (HOSPITAL_COMMUNITY): Payer: Self-pay | Admitting: Emergency Medicine

## 2018-09-30 ENCOUNTER — Emergency Department (HOSPITAL_COMMUNITY)
Admission: EM | Admit: 2018-09-30 | Discharge: 2018-09-30 | Disposition: A | Discharge status: Home / Self Care | Payer: Medicare Other | Attending: Emergency Medicine | Admitting: Emergency Medicine

## 2018-09-30 ENCOUNTER — Emergency Department (HOSPITAL_COMMUNITY): Payer: Medicare Other

## 2018-09-30 ENCOUNTER — Other Ambulatory Visit: Payer: Self-pay

## 2018-09-30 DIAGNOSIS — J449 Chronic obstructive pulmonary disease, unspecified: Secondary | ICD-10-CM | POA: Diagnosis not present

## 2018-09-30 DIAGNOSIS — Z79899 Other long term (current) drug therapy: Secondary | ICD-10-CM | POA: Diagnosis not present

## 2018-09-30 DIAGNOSIS — I120 Hypertensive chronic kidney disease with stage 5 chronic kidney disease or end stage renal disease: Secondary | ICD-10-CM | POA: Insufficient documentation

## 2018-09-30 DIAGNOSIS — R42 Dizziness and giddiness: Secondary | ICD-10-CM

## 2018-09-30 DIAGNOSIS — Z8673 Personal history of transient ischemic attack (TIA), and cerebral infarction without residual deficits: Secondary | ICD-10-CM | POA: Insufficient documentation

## 2018-09-30 DIAGNOSIS — I959 Hypotension, unspecified: Secondary | ICD-10-CM | POA: Diagnosis not present

## 2018-09-30 DIAGNOSIS — E860 Dehydration: Secondary | ICD-10-CM | POA: Insufficient documentation

## 2018-09-30 DIAGNOSIS — F17210 Nicotine dependence, cigarettes, uncomplicated: Secondary | ICD-10-CM | POA: Diagnosis not present

## 2018-09-30 DIAGNOSIS — Z992 Dependence on renal dialysis: Secondary | ICD-10-CM | POA: Insufficient documentation

## 2018-09-30 DIAGNOSIS — N186 End stage renal disease: Secondary | ICD-10-CM | POA: Diagnosis not present

## 2018-09-30 LAB — COMPREHENSIVE METABOLIC PANEL
ALT: 15 U/L (ref 0–44)
AST: 32 U/L (ref 15–41)
Albumin: 3.7 g/dL (ref 3.5–5.0)
Alkaline Phosphatase: 61 U/L (ref 38–126)
Anion gap: 15 (ref 5–15)
BUN: 28 mg/dL — ABNORMAL HIGH (ref 6–20)
CO2: 30 mmol/L (ref 22–32)
Calcium: 9.3 mg/dL (ref 8.9–10.3)
Chloride: 90 mmol/L — ABNORMAL LOW (ref 98–111)
Creatinine, Ser: 9 mg/dL — ABNORMAL HIGH (ref 0.44–1.00)
GFR calc Af Amer: 5 mL/min — ABNORMAL LOW (ref >60)
GFR calc non Af Amer: 4 mL/min — ABNORMAL LOW (ref >60)
Glucose, Bld: 89 mg/dL (ref 70–99)
Potassium: 4.2 mmol/L (ref 3.5–5.1)
Sodium: 135 mmol/L (ref 135–145)
Total Bilirubin: 0.5 mg/dL (ref 0.3–1.2)
Total Protein: 7.4 g/dL (ref 6.5–8.1)

## 2018-09-30 LAB — CBC WITH DIFFERENTIAL/PLATELET
Abs Immature Granulocytes: 0.02 10*3/uL (ref 0.00–0.07)
Basophils Absolute: 0.1 10*3/uL (ref 0.0–0.1)
Basophils Relative: 1 %
Eosinophils Absolute: 0.1 10*3/uL (ref 0.0–0.5)
Eosinophils Relative: 2 %
HCT: 35.3 % — ABNORMAL LOW (ref 36.0–46.0)
Hemoglobin: 11.1 g/dL — ABNORMAL LOW (ref 12.0–15.0)
Immature Granulocytes: 0 %
Lymphocytes Relative: 30 %
Lymphs Abs: 2 10*3/uL (ref 0.7–4.0)
MCH: 31.1 pg (ref 26.0–34.0)
MCHC: 31.4 g/dL (ref 30.0–36.0)
MCV: 98.9 fL (ref 80.0–100.0)
Monocytes Absolute: 0.8 10*3/uL (ref 0.1–1.0)
Monocytes Relative: 13 %
Neutro Abs: 3.6 10*3/uL (ref 1.7–7.7)
Neutrophils Relative %: 54 %
Platelets: 214 10*3/uL (ref 150–400)
RBC: 3.57 MIL/uL — ABNORMAL LOW (ref 3.87–5.11)
RDW: 16.1 % — ABNORMAL HIGH (ref 11.5–15.5)
WBC: 6.6 10*3/uL (ref 4.0–10.5)
nRBC: 0 % (ref 0.0–0.2)

## 2018-09-30 MED ORDER — SODIUM CHLORIDE 0.9 % IV BOLUS
250.0000 mL | Freq: Once | INTRAVENOUS | Status: AC
  Administered 2018-09-30: 11:00:00 250 mL via INTRAVENOUS

## 2018-09-30 NOTE — ED Triage Notes (Addendum)
Per EMS- pt here for eval of 2 weeks of dizziness. Pt denies pain. Pt states she feels especially dizzy after dialysis. Pt is normally T TH S but Th her fistula was clogged so she completed it Friday. Pt was 94L systolic w EMS. Most recent 07A systolic, pt thinks they are pulling off too much fluid. 20 G PIV placed to RAC. Neuro exam WNL. Pt states she has been off her meds for HTN for a while.

## 2018-09-30 NOTE — ED Provider Notes (Signed)
[facility] EMERGENCY DEPARTMENT Provider Note   CSN: [id number] Arrival date & time: 09/30/18  0815    History   Chief Complaint Chief Complaint  Patient presents with  . Dizziness  . Hypotension    HPI Kathleen [name] is a 58 y.o. female presenting for evaluation of dizziness and low blood pressure.  Patient states of the past 2 weeks, she has become dizzy after completing her dialysis sessions.  Dizziness last for about 10 to 20 minutes before resolving without intervention.  Patient goes to dialysis Tuesday, Thursday, Saturday, however she was unable to complete dialysis on Thursday due to her fistula being clogged, so instead she completed dialysis yesterday.  She was scheduled to go today, however felt too poor to go.  When EMS arrived, patient's blood pressure was in 44I systolic.  Patient reports dizziness at that time.  Since being in the ER, patient reports dizziness has improved, is almost completely gone.  She denies recent fevers, chills, sore throat, cough, chest pain, shortness of breath, nausea, vomiting, abdominal pain, or abnormal bowel movements.  She does not produce urine. She denies recent change in medications.  Denies change in appetite.  Patient states she has lost some weight recently, but they were still pulling off the same amount of water weight at dialysis sessions.  She has not spoke to her nephrologist regarding this.  Additional history obtained from chart review, patient with a history of COPD, elevated ANA, ESRD on dialysis, hypertension, lupus, seizures, TIA, vasculitis.     HPI  Past Medical History:  Diagnosis Date  . Anemia   . Arthritis   . Cardiac arrest ([place])   . Cellulitis   . Cerebral vasculitis   . COPD (chronic obstructive pulmonary disease) ([place])   . Elevated antinuclear antibody (ANA) level   . Endometriosis   . ESRD (end stage renal disease) on dialysis ([place])   . Gallstones   . Hypertension   . Lupus ([place])    . Migraine   . PONV (postoperative nausea and vomiting)   . Seizures ([place])   . Sepsis ([place])   . Suicide attempt ([place])   . TB (tuberculosis)   . Transient ischemic attack (TIA)   . Vasculitis of skin     Patient Active Problem List   Diagnosis Date Noted  . Volume overload 05/30/2018  . Syncope 10/08/2017  . Acute encephalopathy 07/11/2017  . ESRD (end stage renal disease) ([place]) 07/11/2017  . COPD (chronic obstructive pulmonary disease) ([place])   . Anemia   . Benign essential HTN 07/01/2017  . Stage 5 chronic kidney disease not on chronic dialysis ([place])   . Hypertensive crisis 06/29/2017  . Hypertensive emergency   . Lupus erythematosus   . Malnutrition of moderate degree 07/14/2016  . Lupus ([place]) 06/28/2016  . Hypertension 06/28/2016  . Kidney disease 06/28/2016  . Symptomatic anemia 06/28/2016  . Abdominal pain 06/28/2016  . Current non-adherence to medical treatment 06/28/2016  . Hypertensive urgency 06/28/2016  . SLE exacerbation ([place]) 06/28/2016  . Renal insufficiency     Past Surgical History:  Procedure Laterality Date  . AV FISTULA PLACEMENT Left 07/04/2017   Procedure: ARTERIOVENOUS (AV) FISTULA CREATION;  Surgeon: Conrad [place], MD;  Location: [facility];  Service: Vascular;  Laterality: Left;  . CESAREAN SECTION    . ESOPHAGOGASTRODUODENOSCOPY N/A 07/15/2016   Procedure: ESOPHAGOGASTRODUODENOSCOPY (EGD);  Surgeon: Laurence Spates, MD;  Location: [facility] ENDOSCOPY;  Service: Endoscopy;  Laterality: N/A;  . INSERTION OF  DIALYSIS CATHETER Right 07/04/2017   Procedure: INSERTION OF TUNNELED  DIALYSIS CATHETER - RIGHT INTERNAL JUGULAR PLACEMENT;  Surgeon: Conrad [place], MD;  Location: [facility];  Service: Vascular;  Laterality: Right;  . TUBAL LIGATION       OB History   No obstetric history on file.      Home Medications    Prior to Admission medications   Medication Sig Start Date End Date Taking? Authorizing Provider  hydroxychloroquine (PLAQUENIL) 200 MG tablet Take 1  tablet (200 mg total) by mouth daily. 05/31/18 05/31/19 Yes Oswald Hillock, MD  lidocaine-prilocaine (EMLA) cream Apply 1 application topically as needed (topical anesthesia for hemodialysis if Gebauers and Lidocaine injection are ineffective.). 07/07/17  Yes Georgette Shell, MD  sevelamer carbonate (RENVELA) 800 MG tablet Take 1,600 mg by mouth 3 (three) times daily with meals.  09/21/18  Yes [provider]  amLODipine (NORVASC) 5 MG tablet Take 1 tablet (5 mg total) by mouth at bedtime. 05/31/18   Oswald Hillock, MD    Family History Family History  Problem Relation Age of Onset  . Ovarian cancer Mother   . Breast cancer Sister   . Throat cancer Brother     Social History Social History   Tobacco Use  . Smoking status: Current Some Day Smoker    Packs/day: 0.50    Types: Cigarettes  . Smokeless tobacco: Never Used  Substance Use Topics  . Alcohol use: No  . Drug use: No     Allergies   Patient has no known allergies.   Review of Systems Review of Systems  Neurological: Positive for dizziness.  All other systems reviewed and are negative.    Physical Exam Updated Vital Signs BP 92/70 (BP Location: Right Arm)   Pulse 84   Temp 98.2 F (36.8 C) (Oral)   Resp 18   Ht 5\' 4"  (1.626 m)   Wt 47.6 kg   SpO2 100%   BMI 18.02 kg/m   Physical Exam Vitals signs and nursing note reviewed.  Constitutional:      General: She is not in acute distress.    Appearance: She is well-developed.     Comments: Appears nontoxic  HENT:     Head: Normocephalic and atraumatic.  Eyes:     Extraocular Movements: Extraocular movements intact.     Conjunctiva/sclera: Conjunctivae normal.     Pupils: Pupils are equal, round, and reactive to light.     Comments: EOMI and PERRLA  Neck:     Musculoskeletal: Normal range of motion and neck supple.  Cardiovascular:     Rate and Rhythm: Normal rate and regular rhythm.     Pulses: Normal pulses.  Pulmonary:     Effort:  Pulmonary effort is normal. No respiratory distress.     Breath sounds: Normal breath sounds. No wheezing.  Abdominal:     General: There is no distension.     Palpations: Abdomen is soft. There is no mass.     Tenderness: There is no abdominal tenderness. There is no guarding or rebound.  Musculoskeletal: Normal range of motion.  Skin:    General: Skin is warm and dry.     Capillary Refill: Capillary refill takes less than 2 seconds.  Neurological:     Mental Status: She is alert and oriented to person, place, and time.     Comments: No obvious neurologic deficit.  CN intact.  Nose finger intact.  Strength and sensation intact x4.  Psychiatric:  Mood and Affect: Mood normal.      ED Treatments / Results  Labs (all labs ordered are listed, but only abnormal results are displayed) Labs Reviewed  CBC WITH DIFFERENTIAL/PLATELET - Abnormal; Notable for the following components:      Result Value   RBC 3.57 (*)    Hemoglobin 11.1 (*)    HCT 35.3 (*)    RDW 16.1 (*)    All other components within normal limits  COMPREHENSIVE METABOLIC PANEL - Abnormal; Notable for the following components:   Chloride 90 (*)    BUN 28 (*)    Creatinine, Ser 9.00 (*)    GFR calc non Af Amer 4 (*)    GFR calc Af Amer 5 (*)    All other components within normal limits    EKG EKG Interpretation  Date/Time:  Saturday Sep 30 2018 08:21:14 EDT Ventricular Rate:  84 PR Interval:    QRS Duration: 89 QT Interval:  393 QTC Calculation: 465 R Axis:   57 Text Interpretation:  Normal sinus rhythm T wave inversion V2 Otherwise no significant change from ekg of 30 May 2018 Confirmed by Pattricia Boss [phone]) on 09/30/2018 11:30:41 AM   Radiology Dg Chest 2 View  Result Date: 09/30/2018 CLINICAL DATA:  Dizziness and hypotension EXAM: CHEST - 2 VIEW COMPARISON:  May 30, 2018 FINDINGS: Lungs are mildly hyperexpanded. There is no appreciable edema or consolidation. There is slight left base  atelectasis. Heart size and pulmonary vascularity are normal. No adenopathy. No pneumothorax. No bone lesions. There is carotid artery calcification on the left. IMPRESSION: Lungs mildly hyperexpanded. Slight left base atelectasis. No edema or consolidation. Heart size within normal limits. There is carotid artery calcification on the left. Electronically Signed   By: Lowella Grip III M.D.   On: 09/30/2018 09:21    Procedures Procedures (including critical care time)  Medications Ordered in ED Medications  sodium chloride 0.9 % bolus 250 mL (0 mLs Intravenous Stopped 09/30/18 1120)  sodium chloride 0.9 % bolus 250 mL (250 mLs Intravenous New Bag/Given 09/30/18 1125)     Initial Impression / Assessment and Plan / ED Course  I have reviewed the triage vital signs and the nursing notes.  Pertinent labs & imaging results that were available during my care of the patient were reviewed by me and considered in my medical decision making (see chart for details).        Patient presenting for evaluation of dizziness, most commonly after dialysis but also this morning.  Physical exam reassuring, patient nontoxic.  Patient with hypotension upon EMS arrival in the 70s, improved in the ED.  Hypotension and dizziness likely secondary to dialysis, consider that there is too much fluid leave removed.  Will obtain labs to assess electrolyte status and to look for signs of infection.  Will obtain chest x-ray to rule out infection, though patient without respiratory symptoms.  EKG due to dizziness and high risk. Case discussed with attending, Dr. Jeanell Sparrow evaluated the pt.   Labs reassuring.  BUN elevated, likely secondary to dehydration.  No leukocytosis, no infection.  Hemoglobin slightly low, although improved from previous.  Chest x-ray viewed interpreted by me, consistent with COPD but no pneumonia, pneumothorax, effusion, cardiomegaly.  Patient remains afebrile.  Orthostatics reassuring, although patient's  blood pressure was documented again is been low.  Will give small bolus and reassess. EKG non-concerning.  After initial bolus, no significant change in blood pressure.  Patient reports no change in symptoms.  Will give another 250 and reassess.  After second bolus, patient's blood pressure improved, 407-680 systolic.  Patient continues to appear well.  Discussed reassuring results with patient, and encourage patient to follow-up with nephrologist regarding dialysis, and the amount of fluid being pulled off. Strict return precautions given, including signs of infection, chest pain, difficulty breathing, or worsening dizziness.  At this time, patient appears safe for discharge.  Patient states she understands agrees to plan.   Final Clinical Impressions(s) / ED Diagnoses   Final diagnoses:  Hypotension, unspecified hypotension type  Dizziness  Dehydration    ED Discharge Orders    None       Franchot Heidelberg, PA-C 09/30/18 1223    Pattricia Boss, MD 09/30/18 1441

## 2018-09-30 NOTE — Discharge Instructions (Addendum)
Continue taking home medications as prescribed. It is important that you talk to your nephrologist about your ER visit today.  Your labs and EKG were reassuring. Your low blood pressure and dizziness are likely due to dehydration/dialysis. You may need to have an adjustment in the amount of fluid being taken off.  Return to the ER if ou develop fevers, difficulty breathing, chest pain, worsening dizziness, or with any new, worsening, or concerning symptomss

## 2018-10-03 DIAGNOSIS — R519 Headache, unspecified: Secondary | ICD-10-CM | POA: Insufficient documentation

## 2018-10-04 DIAGNOSIS — G629 Polyneuropathy, unspecified: Secondary | ICD-10-CM | POA: Insufficient documentation

## 2018-11-25 DIAGNOSIS — L84 Corns and callosities: Secondary | ICD-10-CM | POA: Insufficient documentation

## 2018-11-25 DIAGNOSIS — B351 Tinea unguium: Secondary | ICD-10-CM | POA: Insufficient documentation

## 2018-11-25 DIAGNOSIS — M7742 Metatarsalgia, left foot: Secondary | ICD-10-CM | POA: Insufficient documentation

## 2019-10-23 ENCOUNTER — Other Ambulatory Visit: Payer: Self-pay | Admitting: *Deleted

## 2019-10-23 DIAGNOSIS — N186 End stage renal disease: Secondary | ICD-10-CM

## 2019-10-31 ENCOUNTER — Ambulatory Visit (INDEPENDENT_AMBULATORY_CARE_PROVIDER_SITE_OTHER): Payer: Medicare Other | Admitting: Vascular Surgery

## 2019-10-31 ENCOUNTER — Encounter: Payer: Self-pay | Admitting: Vascular Surgery

## 2019-10-31 ENCOUNTER — Other Ambulatory Visit: Payer: Self-pay

## 2019-10-31 ENCOUNTER — Ambulatory Visit (HOSPITAL_COMMUNITY)
Admission: RE | Admit: 2019-10-31 | Discharge: 2019-10-31 | Disposition: A | Discharge status: Home / Self Care | Payer: Medicare Other | Source: Ambulatory Visit | Attending: Vascular Surgery | Admitting: Vascular Surgery

## 2019-10-31 ENCOUNTER — Ambulatory Visit (INDEPENDENT_AMBULATORY_CARE_PROVIDER_SITE_OTHER)
Admission: RE | Admit: 2019-10-31 | Discharge: 2019-10-31 | Disposition: A | Discharge status: Home / Self Care | Payer: Medicare Other | Source: Ambulatory Visit | Attending: Vascular Surgery | Admitting: Vascular Surgery

## 2019-10-31 VITALS — BP 103/60 | HR 76 | Temp 97.3°F | Resp 20 | Ht 64.0 in | Wt 108.8 lb

## 2019-10-31 DIAGNOSIS — N186 End stage renal disease: Secondary | ICD-10-CM

## 2019-10-31 NOTE — H&P (View-Only) (Signed)
REASON FOR VISIT:   To evaluate for hemodialysis access.  The consult is requested by Dr. Edrick Oh.  MEDICAL ISSUES:   END-STAGE RENAL DISEASE: This patient has a clotted right upper arm fistula.  Her basilic vein by duplex is quite small.  Her artery is somewhat small but I think she would be a reasonable candidate for an AV graft in the left arm.  If the antecubital veins were reasonable she can potentially have a forearm graft.  Otherwise we will place an upper arm graft.  She dialyzes Tuesdays Thursdays and Saturdays and we will schedule this on Monday, 11/19/2019.  I have reviewed the indications for the procedure and the potential complications and she is agreeable to proceed.   HPI:   Kathleen [name] is a pleasant 59 y.o. female who was last seen by Gerri Lins, [place] on 09/07/2017.  This patient had a right radiocephalic fistula placed at [facility] in [place] which ultimately failed.  More recently she had a left brachiocephalic fistula and placement of a tunneled dialysis catheter.  The patient is referred for new hemodialysis access.  I have reviewed the records from the referring office.  Apparently there was some concern that the fistula may be clotted and the patient was sent for further evaluation.  The patient dialyzes on Tuesdays Thursdays and Saturdays.  She has a functioning right IJ tunneled dialysis catheter.  Her left brachiocephalic fistula was placed by Dr. Adele Barthel on 07/04/2017.  She is not sure when this occluded.  She denies pain or paresthesias in her left arm.  Past Medical History:  Diagnosis Date  . Anemia   . Arthritis   . Cardiac arrest ([place])   . Cellulitis   . Cerebral vasculitis   . COPD (chronic obstructive pulmonary disease) ([place])   . Elevated antinuclear antibody (ANA) level   . Endometriosis   . ESRD (end stage renal disease) on dialysis ([place])   . Gallstones   . Hypertension   . Lupus ([place])   . Migraine   . PONV  (postoperative nausea and vomiting)   . Seizures ([place])   . Sepsis ([place])   . Suicide attempt ([place])   . TB (tuberculosis)   . Transient ischemic attack (TIA)   . Vasculitis of skin     Family History  Problem Relation Age of Onset  . Ovarian cancer Mother   . Breast cancer Sister   . Throat cancer Brother     SOCIAL HISTORY: Social History   Tobacco Use  . Smoking status: Current Some Day Smoker    Packs/day: 0.50    Types: Cigarettes  . Smokeless tobacco: Never Used  Substance Use Topics  . Alcohol use: No    No Known Allergies  Current Outpatient Medications  Medication Sig Dispense Refill  . amLODipine (NORVASC) 5 MG tablet Take 1 tablet (5 mg total) by mouth at bedtime. 30 tablet 3  . B Complex Vitamins (VITAMIN-B COMPLEX) TABS Take 1 tablet by mouth daily.    . Cinacalcet HCl (SENSIPAR PO) Take by mouth.    . hydroxychloroquine (PLAQUENIL) 200 MG tablet Take by mouth.    . lidocaine-prilocaine (EMLA) cream Apply 1 application topically as needed (topical anesthesia for hemodialysis if Gebauers and Lidocaine injection are ineffective.). 30 g 0  . sevelamer carbonate (RENVELA) 800 MG tablet Take 1,600 mg by mouth 3 (three) times daily with meals.     . vitamin A 3 MG (10000 UNITS) capsule Take by  mouth.     No current facility-administered medications for this visit.    REVIEW OF SYSTEMS:  [X]  denotes positive finding, [ ]  denotes negative finding Cardiac  Comments:  Chest pain or chest pressure:    Shortness of breath upon exertion:    Short of breath when lying flat:    Irregular heart rhythm:        Vascular    Pain in calf, thigh, or hip brought on by ambulation:    Pain in feet at night that wakes you up from your sleep:     Blood clot in your veins:    Leg swelling:         Pulmonary    Oxygen at home:    Productive cough:     Wheezing:         Neurologic    Sudden weakness in arms or legs:     Sudden numbness in arms or legs:     Sudden onset  of difficulty speaking or slurred speech:    Temporary loss of vision in one eye:     Problems with dizziness:         Gastrointestinal    Blood in stool:     Vomited blood:         Genitourinary    Burning when urinating:     Blood in urine:        Psychiatric    Major depression:         Hematologic    Bleeding problems:    Problems with blood clotting too easily:        Skin    Rashes or ulcers:        Constitutional    Fever or chills:     PHYSICAL EXAM:   Vitals:   10/31/19 1024  BP: 103/60  Pulse: 76  Resp: 20  Temp: (!) 97.3 F (36.3 C)  SpO2: 96%  Weight: 108 lb 12.8 oz (49.4 kg)  Height: 5\' 4"  (1.626 m)    GENERAL: The patient is a well-nourished female, in no acute distress. The vital signs are documented above. CARDIAC: There is a regular rate and rhythm.  VASCULAR: I do not detect carotid bruits. I cannot palpate a right radial pulse.  She does have a palpable left radial pulse. Her upper arm fistula on the left is occluded.  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 PULMONARY: There is good air exchange bilaterally without wheezing or rales. ABDOMEN: Soft and non-tender with normal pitched bowel sounds.  MUSCULOSKELETAL: There are no major deformities or cyanosis. NEUROLOGIC: No focal weakness or paresthesias are detected. SKIN: There are no ulcers or rashes noted. PSYCHIATRIC: The patient has a normal affect.  DATA:    ARTERIAL DUPLEX: I have independently interpreted her arterial duplex scan today.  On the right side there is a triphasic ulnar signal with a monophasic radial signal.  On the left side there is a triphasic radial and ulnar signal.  The brachial artery measures 0.54 cm in diameter.  UPPER EXTREMITY VEIN MAP: I have independently interpreted her upper extremity vein map.  On the right side the upper arm cephalic vein is not visualized.  The basilic vein is small.  On the left side the upper arm fistula is  thrombosed.  The basilic vein is small.  [name] [facility] [phone]

## 2019-10-31 NOTE — Progress Notes (Signed)
REASON FOR VISIT:   To evaluate for hemodialysis access.  The consult is requested by Dr. Edrick Oh.  MEDICAL ISSUES:   END-STAGE RENAL DISEASE: This patient has a clotted right upper arm fistula.  Her basilic vein by duplex is quite small.  Her artery is somewhat small but I think she would be a reasonable candidate for an AV graft in the left arm.  If the antecubital veins were reasonable she can potentially have a forearm graft.  Otherwise we will place an upper arm graft.  She dialyzes Tuesdays Thursdays and Saturdays and we will schedule this on Monday, 11/19/2019.  I have reviewed the indications for the procedure and the potential complications and she is agreeable to proceed.   HPI:   Kathleen [name] is a pleasant 59 y.o. female who was last seen by Gerri Lins, [place] on 09/07/2017.  This patient had a right radiocephalic fistula placed at [facility] in [place] which ultimately failed.  More recently she had a left brachiocephalic fistula and placement of a tunneled dialysis catheter.  The patient is referred for new hemodialysis access.  I have reviewed the records from the referring office.  Apparently there was some concern that the fistula may be clotted and the patient was sent for further evaluation.  The patient dialyzes on Tuesdays Thursdays and Saturdays.  She has a functioning right IJ tunneled dialysis catheter.  Her left brachiocephalic fistula was placed by Dr. Adele Barthel on 07/04/2017.  She is not sure when this occluded.  She denies pain or paresthesias in her left arm.  Past Medical History:  Diagnosis Date  . Anemia   . Arthritis   . Cardiac arrest ([place])   . Cellulitis   . Cerebral vasculitis   . COPD (chronic obstructive pulmonary disease) ([place])   . Elevated antinuclear antibody (ANA) level   . Endometriosis   . ESRD (end stage renal disease) on dialysis ([place])   . Gallstones   . Hypertension   . Lupus ([place])   . Migraine   . PONV  (postoperative nausea and vomiting)   . Seizures ([place])   . Sepsis ([place])   . Suicide attempt ([place])   . TB (tuberculosis)   . Transient ischemic attack (TIA)   . Vasculitis of skin     Family History  Problem Relation Age of Onset  . Ovarian cancer Mother   . Breast cancer Sister   . Throat cancer Brother     SOCIAL HISTORY: Social History   Tobacco Use  . Smoking status: Current Some Day Smoker    Packs/day: 0.50    Types: Cigarettes  . Smokeless tobacco: Never Used  Substance Use Topics  . Alcohol use: No    No Known Allergies  Current Outpatient Medications  Medication Sig Dispense Refill  . amLODipine (NORVASC) 5 MG tablet Take 1 tablet (5 mg total) by mouth at bedtime. 30 tablet 3  . B Complex Vitamins (VITAMIN-B COMPLEX) TABS Take 1 tablet by mouth daily.    . Cinacalcet HCl (SENSIPAR PO) Take by mouth.    . hydroxychloroquine (PLAQUENIL) 200 MG tablet Take by mouth.    . lidocaine-prilocaine (EMLA) cream Apply 1 application topically as needed (topical anesthesia for hemodialysis if Gebauers and Lidocaine injection are ineffective.). 30 g 0  . sevelamer carbonate (RENVELA) 800 MG tablet Take 1,600 mg by mouth 3 (three) times daily with meals.     . vitamin A 3 MG (10000 UNITS) capsule Take by  mouth.     No current facility-administered medications for this visit.    REVIEW OF SYSTEMS:  [X]  denotes positive finding, [ ]  denotes negative finding Cardiac  Comments:  Chest pain or chest pressure:    Shortness of breath upon exertion:    Short of breath when lying flat:    Irregular heart rhythm:        Vascular    Pain in calf, thigh, or hip brought on by ambulation:    Pain in feet at night that wakes you up from your sleep:     Blood clot in your veins:    Leg swelling:         Pulmonary    Oxygen at home:    Productive cough:     Wheezing:         Neurologic    Sudden weakness in arms or legs:     Sudden numbness in arms or legs:     Sudden onset  of difficulty speaking or slurred speech:    Temporary loss of vision in one eye:     Problems with dizziness:         Gastrointestinal    Blood in stool:     Vomited blood:         Genitourinary    Burning when urinating:     Blood in urine:        Psychiatric    Major depression:         Hematologic    Bleeding problems:    Problems with blood clotting too easily:        Skin    Rashes or ulcers:        Constitutional    Fever or chills:     PHYSICAL EXAM:   Vitals:   10/31/19 1024  BP: 103/60  Pulse: 76  Resp: 20  Temp: (!) 97.3 F (36.3 C)  SpO2: 96%  Weight: 108 lb 12.8 oz (49.4 kg)  Height: 5\' 4"  (1.626 m)    GENERAL: The patient is a well-nourished female, in no acute distress. The vital signs are documented above. CARDIAC: There is a regular rate and rhythm.  VASCULAR: I do not detect carotid bruits. I cannot palpate a right radial pulse.  She does have a palpable left radial pulse. Her upper arm fistula on the left is occluded.  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 PULMONARY: There is good air exchange bilaterally without wheezing or rales. ABDOMEN: Soft and non-tender with normal pitched bowel sounds.  MUSCULOSKELETAL: There are no major deformities or cyanosis. NEUROLOGIC: No focal weakness or paresthesias are detected. SKIN: There are no ulcers or rashes noted. PSYCHIATRIC: The patient has a normal affect.  DATA:    ARTERIAL DUPLEX: I have independently interpreted her arterial duplex scan today.  On the right side there is a triphasic ulnar signal with a monophasic radial signal.  On the left side there is a triphasic radial and ulnar signal.  The brachial artery measures 0.54 cm in diameter.  UPPER EXTREMITY VEIN MAP: I have independently interpreted her upper extremity vein map.  On the right side the upper arm cephalic vein is not visualized.  The basilic vein is small.  On the left side the upper arm fistula is  thrombosed.  The basilic vein is small.  [name] [facility] [phone]

## 2019-11-16 ENCOUNTER — Other Ambulatory Visit: Payer: Self-pay

## 2019-11-23 ENCOUNTER — Other Ambulatory Visit (HOSPITAL_COMMUNITY)
Admission: RE | Admit: 2019-11-23 | Discharge: 2019-11-23 | Disposition: A | Discharge status: Home / Self Care | Payer: Medicare Other | Source: Ambulatory Visit | Attending: Vascular Surgery | Admitting: Vascular Surgery

## 2019-11-23 DIAGNOSIS — Z01812 Encounter for preprocedural laboratory examination: Secondary | ICD-10-CM | POA: Insufficient documentation

## 2019-11-23 DIAGNOSIS — Z20822 Contact with and (suspected) exposure to covid-19: Secondary | ICD-10-CM | POA: Insufficient documentation

## 2019-11-23 LAB — SARS CORONAVIRUS 2 (TAT 6-24 HRS): SARS Coronavirus 2: NEGATIVE

## 2019-11-24 ENCOUNTER — Encounter (HOSPITAL_COMMUNITY): Payer: Self-pay | Admitting: Vascular Surgery

## 2019-11-24 NOTE — Progress Notes (Signed)
Patient denies chest pain, shortness or breath, or cardiology visit. Reports she is riding either [facility] or [facility] [employer] [facility] but that roommate will be with her. Patient reports being in quarantine since COVID test.

## 2019-11-26 NOTE — Anesthesia Preprocedure Evaluation (Addendum)
Anesthesia Evaluation  Patient identified by MRN, date of birth, ID band Patient awake    Reviewed: Allergy & Precautions, NPO status , Patient's Chart, lab work & pertinent test results  Airway Mallampati: II  TM Distance: >3 FB Neck ROM: Full    Dental  (+) Edentulous Upper, Edentulous Lower   Pulmonary sleep apnea , COPD, Current Smoker,    breath sounds clear to auscultation       Cardiovascular hypertension, Pt. on medications  Rhythm:Regular Rate:Normal     Neuro/Psych    GI/Hepatic negative GI ROS,   Endo/Other  negative endocrine ROS  Renal/GU ESRF and DialysisRenal disease     Musculoskeletal  (+) Arthritis ,   Abdominal   Peds  Hematology  (+) anemia ,   Anesthesia Other Findings SLE  Reproductive/Obstetrics                           Anesthesia Physical Anesthesia Plan  ASA: III  Anesthesia Plan: MAC   Post-op Pain Management:    Induction:   PONV Risk Score and Plan: 1 and Propofol infusion, Ondansetron and Treatment may vary due to age or medical condition  Airway Management Planned: Natural Airway and Simple Face Mask  Additional Equipment:   Intra-op Plan:   Post-operative Plan:   Informed Consent: I have reviewed the patients History and Physical, chart, labs and discussed the procedure including the risks, benefits and alternatives for the proposed anesthesia with the patient or authorized representative who has indicated his/her understanding and acceptance.       Plan Discussed with: CRNA  Anesthesia Plan Comments: (   )      Anesthesia Quick Evaluation

## 2019-11-26 NOTE — Progress Notes (Addendum)
Anesthesia Chart Review: SAME DAY WORK-UP   Case: [id number] Date/Time: 11/27/19 0715   Procedure: INSERTION OF ARTERIOVENOUS (AV) GORE-TEX GRAFT ARM (Left )   Anesthesia type: Monitor Anesthesia Care   Pre-op diagnosis: END STAGE RENAL DISEASE   Location: [place] OR ROOM 11 / [place] [facility]   Surgeons: Angelia Mould, MD      DISCUSSION: Patient is a 59 year old female scheduled for the above procedure. As of 10/30/17, on hemodialysis TTS via right IJ tunneled dialysis catheter and has occluded left brachiocephalic AVF.  History includes smoking, post-operative N/V, HTN, Lupus (SLE diagnosed ~ age 36; discoid lupus erythematosus alopecia 2005 as outlined Care Everywhere), TIA/CVA (chronic small vessel disease, old lacunar infarcts, small old left cerebellar infarct 10/09/17 brain MRI), cerebral vasculitis ("remote"), COPD, endometriosis, anemia, TB (1980's), suicide attempt (1980's), migraines, seizures, OSA, sepsis (1980's), ESRD (hemodialysis initiated 07/04/17; HD TTS), ectopic pregnancy surgery. Cardiac arrest history is listed (first noted as "History of cardiac arrest" in 05/23/12 [facility] neurology records, reportedly patient is not aware of this history).  LVEF 55-60% without regional wall motion abnormalities 10/2017 echo. Last EKG seen is fro 2019, so would anticipated updated EKG on arrival. She denied chest pain or shortness of breath per PAT RN phone interview.  Also per interview, she is writing either [facility] or [employer] transportation but will have her roommate with her.  11/23/2019 presurgical COVID-19 test negative.   VS: Ht 5\' 4"  (1.626 m)   Wt 49.9 kg   BMI 18.88 kg/m  As of 10/31/19: BP: 103/60  Pulse: 76  Resp: 20  Temp: (!) 97.3 F (36.3 C)  SpO2: 96%  Weight: 108 lb 12.8 oz (49.4 kg)  Height: 5\' 4"  (1.626 m)    PROVIDERS: Angela Burke, NP is listed as PCP - Had been seeing rheumatologist Adele Schilder, MD at [facility]. Last visit seen 03/02/18.    LABS: For  labs day of surgery.    OTHER: EEG 10/10/17: IMPRESSION: This EEG demonstrated no focal, hemispheric, or lateralizing features and generally is within ranges of normal.  There was an excessive amount of fast activity noted throughout the recording, which is often due to medication such as benzodiazepines.  Correlate clinically.  There was no epileptiform activity noted on this EEG.   EKG: Last EKG seen is from 09/30/18.    CV: Echo 10/10/17 (for syncope post-dialysis): Study Conclusions  - Left ventricle: The cavity size was normal. Wall thickness was  increased in a pattern of mild LVH. Systolic function was normal.  The estimated ejection fraction was in the range of 55% to 60%.  Wall motion was normal; there were no regional wall motion  abnormalities. Doppler parameters are consistent with abnormal  left ventricular relaxation (grade 1 diastolic dysfunction).  - Aortic valve: Trileaflet; mildly thickened, mildly calcified  leaflets. There was mild regurgitation.  - Aorta: Ascending aortic diameter: 42 mm (S).  - Ascending aorta: The ascending aorta was mildly dilated.  - Mitral valve: Calcified annulus.  (Comparison 06/30/17 in setting of hypertensive emergency, pulmonary edema/hypoxia in setting of progression to ESRD requiring initiation of hemodialysis: mild LVH, LVEF 45-50%, diffuse hypokinesis, grade 2 DD, moderate AR, mild MR, LA severely dilated, RA mildly dilated)   Past Medical History:  Diagnosis Date  . Anemia   . Arthritis   . Cardiac arrest ([place])   . Cellulitis   . Cerebral vasculitis   . COPD (chronic obstructive pulmonary disease) ([place])   . Elevated antinuclear antibody (ANA) level   .  Endometriosis   . ESRD (end stage renal disease) on dialysis ([place])   . Gallstones   . Hypertension   . Lupus ([place])   . Migraine   . PONV (postoperative nausea and vomiting)   . Seizures ([place])   . Sepsis ([place])   . Sleep apnea   . Suicide attempt ([place])   . TB  (tuberculosis)   . Transient ischemic attack (TIA)   . Vasculitis of skin     Past Surgical History:  Procedure Laterality Date  . AV FISTULA PLACEMENT Left 07/04/2017   Procedure: ARTERIOVENOUS (AV) FISTULA CREATION;  Surgeon: Conrad Mulvane, MD;  Location: [place];  Service: Vascular;  Laterality: Left;  . CESAREAN SECTION    . ESOPHAGOGASTRODUODENOSCOPY N/A 07/15/2016   Procedure: ESOPHAGOGASTRODUODENOSCOPY (EGD);  Surgeon: Laurence Spates, MD;  Location[facility] ENDOSCOPY;  Service: Endoscopy;  Laterality: N/A;  . INSERTION OF DIALYSIS CATHETER Right 07/04/2017   Procedure: INSERTION OF TUNNELED  DIALYSIS CATHETER - RIGHT INTERNAL JUGULAR PLACEMENT;  Surgeon: Conrad Hardesty, MD;  Location: [place];  Service: Vascular;  Laterality: Right;  . LUNG BIOPSY    . TUBAL LIGATION      MEDICATIONS: No current facility-administered medications for this encounter.   Marland Kitchen amLODipine (NORVASC) 5 MG tablet  . hydroxychloroquine (PLAQUENIL) 200 MG tablet  . sevelamer carbonate (RENVELA) 800 MG tablet  . lidocaine-prilocaine (EMLA) cream    Myra Gianotti, PA-C Surgical Short Stay/Anesthesiology [facility] Phone [phone] [facility] Phone [phone] 11/26/2019 11:58 AM

## 2019-11-27 ENCOUNTER — Ambulatory Visit (HOSPITAL_COMMUNITY): Payer: Medicare Other | Admitting: Vascular Surgery

## 2019-11-27 ENCOUNTER — Ambulatory Visit (HOSPITAL_COMMUNITY)
Admission: RE | Admit: 2019-11-27 | Discharge: 2019-11-27 | Disposition: A | Discharge status: Home / Self Care | Payer: Medicare Other | Attending: Vascular Surgery | Admitting: Vascular Surgery

## 2019-11-27 ENCOUNTER — Encounter (HOSPITAL_COMMUNITY): Payer: Self-pay | Admitting: Vascular Surgery

## 2019-11-27 ENCOUNTER — Encounter (HOSPITAL_COMMUNITY)
Admission: RE | Disposition: A | Discharge status: Home / Self Care | Payer: Self-pay | Source: Home / Self Care | Attending: Vascular Surgery

## 2019-11-27 ENCOUNTER — Other Ambulatory Visit: Payer: Self-pay

## 2019-11-27 DIAGNOSIS — Y718 Miscellaneous cardiovascular devices associated with adverse incidents, not elsewhere classified: Secondary | ICD-10-CM | POA: Diagnosis not present

## 2019-11-27 DIAGNOSIS — T82898A Other specified complication of vascular prosthetic devices, implants and grafts, initial encounter: Secondary | ICD-10-CM | POA: Diagnosis not present

## 2019-11-27 DIAGNOSIS — M3214 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: Secondary | ICD-10-CM | POA: Diagnosis not present

## 2019-11-27 DIAGNOSIS — F17210 Nicotine dependence, cigarettes, uncomplicated: Secondary | ICD-10-CM | POA: Insufficient documentation

## 2019-11-27 DIAGNOSIS — Z8674 Personal history of sudden cardiac arrest: Secondary | ICD-10-CM | POA: Diagnosis not present

## 2019-11-27 DIAGNOSIS — G4730 Sleep apnea, unspecified: Secondary | ICD-10-CM | POA: Diagnosis not present

## 2019-11-27 DIAGNOSIS — J449 Chronic obstructive pulmonary disease, unspecified: Secondary | ICD-10-CM | POA: Diagnosis not present

## 2019-11-27 DIAGNOSIS — M1990 Unspecified osteoarthritis, unspecified site: Secondary | ICD-10-CM | POA: Diagnosis not present

## 2019-11-27 DIAGNOSIS — N186 End stage renal disease: Secondary | ICD-10-CM | POA: Diagnosis not present

## 2019-11-27 DIAGNOSIS — M329 Systemic lupus erythematosus, unspecified: Secondary | ICD-10-CM | POA: Insufficient documentation

## 2019-11-27 DIAGNOSIS — I120 Hypertensive chronic kidney disease with stage 5 chronic kidney disease or end stage renal disease: Secondary | ICD-10-CM | POA: Diagnosis not present

## 2019-11-27 DIAGNOSIS — T82510A Breakdown (mechanical) of surgically created arteriovenous fistula, initial encounter: Secondary | ICD-10-CM | POA: Insufficient documentation

## 2019-11-27 DIAGNOSIS — Z79899 Other long term (current) drug therapy: Secondary | ICD-10-CM | POA: Insufficient documentation

## 2019-11-27 DIAGNOSIS — Z992 Dependence on renal dialysis: Secondary | ICD-10-CM | POA: Diagnosis not present

## 2019-11-27 DIAGNOSIS — Z8673 Personal history of transient ischemic attack (TIA), and cerebral infarction without residual deficits: Secondary | ICD-10-CM | POA: Diagnosis not present

## 2019-11-27 HISTORY — PX: AV FISTULA PLACEMENT: SHX1204

## 2019-11-27 HISTORY — DX: Sleep apnea, unspecified: G47.30

## 2019-11-27 LAB — POCT I-STAT, CHEM 8
BUN: 47 mg/dL — ABNORMAL HIGH (ref 6–20)
Calcium, Ion: 1.06 mmol/L — ABNORMAL LOW (ref 1.15–1.40)
Chloride: 99 mmol/L (ref 98–111)
Creatinine, Ser: 11.9 mg/dL — ABNORMAL HIGH (ref 0.44–1.00)
Glucose, Bld: 78 mg/dL (ref 70–99)
HCT: 35 % — ABNORMAL LOW (ref 36.0–46.0)
Hemoglobin: 11.9 g/dL — ABNORMAL LOW (ref 12.0–15.0)
Potassium: 4.4 mmol/L (ref 3.5–5.1)
Sodium: 139 mmol/L (ref 135–145)
TCO2: 28 mmol/L (ref 22–32)

## 2019-11-27 SURGERY — INSERTION OF ARTERIOVENOUS (AV) GORE-TEX GRAFT ARM
Anesthesia: Monitor Anesthesia Care | Laterality: Left

## 2019-11-27 MED ORDER — CHLORHEXIDINE GLUCONATE 4 % EX LIQD: 60.0000 mL | Freq: Once | CUTANEOUS | Status: DC

## 2019-11-27 MED ORDER — LIDOCAINE-EPINEPHRINE (PF) 1 %-1:200000 IJ SOLN
INTRAMUSCULAR | Status: AC
  Filled 2019-11-27: qty 30

## 2019-11-27 MED ORDER — PHENYLEPHRINE 40 MCG/ML (10ML) SYRINGE FOR IV PUSH (FOR BLOOD PRESSURE SUPPORT)
PREFILLED_SYRINGE | INTRAVENOUS | Status: AC
  Filled 2019-11-27: qty 10

## 2019-11-27 MED ORDER — FENTANYL CITRATE (PF) 100 MCG/2ML IJ SOLN: 25.0000 ug | INTRAMUSCULAR | Status: DC | PRN

## 2019-11-27 MED ORDER — PROPOFOL 500 MG/50ML IV EMUL
INTRAVENOUS | Status: DC | PRN
  Administered 2019-11-27: 100 ug/kg/min via INTRAVENOUS

## 2019-11-27 MED ORDER — CEFAZOLIN SODIUM-DEXTROSE 2-4 GM/100ML-% IV SOLN
2.0000 g | INTRAVENOUS | Status: AC
  Administered 2019-11-27: 2 g via INTRAVENOUS
  Filled 2019-11-27: qty 100

## 2019-11-27 MED ORDER — PROPOFOL 10 MG/ML IV BOLUS
INTRAVENOUS | Status: AC
  Filled 2019-11-27: qty 20

## 2019-11-27 MED ORDER — MIDAZOLAM HCL 2 MG/2ML IJ SOLN
INTRAMUSCULAR | Status: DC | PRN
  Administered 2019-11-27 (×2): 1 mg via INTRAVENOUS

## 2019-11-27 MED ORDER — SODIUM CHLORIDE 0.9 % IV SOLN: INTRAVENOUS | Status: DC

## 2019-11-27 MED ORDER — SODIUM CHLORIDE 0.9 % IV SOLN
INTRAVENOUS | Status: DC | PRN
  Administered 2019-11-27: 500 mL

## 2019-11-27 MED ORDER — AMISULPRIDE (ANTIEMETIC) 5 MG/2ML IV SOLN: 10.0000 mg | Freq: Once | INTRAVENOUS | Status: DC | PRN

## 2019-11-27 MED ORDER — 0.9 % SODIUM CHLORIDE (POUR BTL) OPTIME
TOPICAL | Status: DC | PRN
  Administered 2019-11-27: 1000 mL

## 2019-11-27 MED ORDER — MIDAZOLAM HCL 2 MG/2ML IJ SOLN
INTRAMUSCULAR | Status: AC
  Filled 2019-11-27: qty 2

## 2019-11-27 MED ORDER — LIDOCAINE HCL (PF) 1 % IJ SOLN
INTRAMUSCULAR | Status: AC
  Filled 2019-11-27: qty 30

## 2019-11-27 MED ORDER — PAPAVERINE HCL 30 MG/ML IJ SOLN
INTRAMUSCULAR | Status: DC | PRN
  Administered 2019-11-27: 60 mg

## 2019-11-27 MED ORDER — LIDOCAINE 2% (20 MG/ML) 5 ML SYRINGE
INTRAMUSCULAR | Status: AC
  Filled 2019-11-27: qty 5

## 2019-11-27 MED ORDER — ACETAMINOPHEN 500 MG PO TABS
1000.0000 mg | ORAL_TABLET | Freq: Once | ORAL | Status: AC
  Administered 2019-11-27: 1000 mg via ORAL
  Filled 2019-11-27: qty 2

## 2019-11-27 MED ORDER — CHLORHEXIDINE GLUCONATE 0.12 % MT SOLN: 15.0000 mL | Freq: Once | OROMUCOSAL | Status: AC

## 2019-11-27 MED ORDER — PAPAVERINE HCL 30 MG/ML IJ SOLN
INTRAMUSCULAR | Status: AC
  Filled 2019-11-27: qty 2

## 2019-11-27 MED ORDER — FENTANYL CITRATE (PF) 100 MCG/2ML IJ SOLN
INTRAMUSCULAR | Status: DC | PRN
  Administered 2019-11-27: 50 ug via INTRAVENOUS

## 2019-11-27 MED ORDER — HYDROCODONE-ACETAMINOPHEN 5-325 MG PO TABS: 1.0000 | ORAL_TABLET | ORAL | 0 refills | Status: DC | PRN

## 2019-11-27 MED ORDER — LIDOCAINE 2% (20 MG/ML) 5 ML SYRINGE
INTRAMUSCULAR | Status: DC | PRN
  Administered 2019-11-27: 20 mg via INTRAVENOUS

## 2019-11-27 MED ORDER — FENTANYL CITRATE (PF) 250 MCG/5ML IJ SOLN
INTRAMUSCULAR | Status: AC
  Filled 2019-11-27: qty 5

## 2019-11-27 MED ORDER — CHLORHEXIDINE GLUCONATE 0.12 % MT SOLN
OROMUCOSAL | Status: AC
  Administered 2019-11-27: 15 mL
  Filled 2019-11-27: qty 15

## 2019-11-27 MED ORDER — PHENYLEPHRINE 40 MCG/ML (10ML) SYRINGE FOR IV PUSH (FOR BLOOD PRESSURE SUPPORT)
PREFILLED_SYRINGE | INTRAVENOUS | Status: DC | PRN
  Administered 2019-11-27: 80 ug via INTRAVENOUS
  Administered 2019-11-27: 120 ug via INTRAVENOUS
  Administered 2019-11-27: 40 ug via INTRAVENOUS
  Administered 2019-11-27 (×3): 80 ug via INTRAVENOUS
  Administered 2019-11-27: 120 ug via INTRAVENOUS

## 2019-11-27 MED ORDER — PROTAMINE SULFATE 10 MG/ML IV SOLN
INTRAVENOUS | Status: DC | PRN
  Administered 2019-11-27: 25 mg via INTRAVENOUS

## 2019-11-27 MED ORDER — LIDOCAINE HCL 1 % IJ SOLN
INTRAMUSCULAR | Status: DC | PRN
  Administered 2019-11-27: 15 mL

## 2019-11-27 MED ORDER — SODIUM CHLORIDE 0.9 % IV SOLN
INTRAVENOUS | Status: AC
  Filled 2019-11-27: qty 1.2

## 2019-11-27 MED ORDER — HEPARIN SODIUM (PORCINE) 1000 UNIT/ML IJ SOLN
INTRAMUSCULAR | Status: DC | PRN
  Administered 2019-11-27: 5000 [IU] via INTRAVENOUS

## 2019-11-27 MED ORDER — LIDOCAINE-EPINEPHRINE (PF) 1 %-1:200000 IJ SOLN
INTRAMUSCULAR | Status: DC | PRN
  Administered 2019-11-27: 30 mL

## 2019-11-27 MED ORDER — HEPARIN SODIUM (PORCINE) 1000 UNIT/ML IJ SOLN
INTRAMUSCULAR | Status: AC
  Filled 2019-11-27: qty 1

## 2019-11-27 MED ORDER — ORAL CARE MOUTH RINSE: 15.0000 mL | Freq: Once | OROMUCOSAL | Status: AC

## 2019-11-27 SURGICAL SUPPLY — 42 items
ARMBAND PINK RESTRICT EXTREMIT (MISCELLANEOUS) ×3 IMPLANT
CANISTER SUCT 3000ML PPV (MISCELLANEOUS) ×3 IMPLANT
CANNULA VESSEL 3MM 2 BLNT TIP (CANNULA) ×6 IMPLANT
CATH EMB 4FR 40CM (CATHETERS) ×3 IMPLANT
CLIP VESOCCLUDE MED 6/CT (CLIP) ×3 IMPLANT
CLIP VESOCCLUDE SM WIDE 6/CT (CLIP) ×3 IMPLANT
COVER WAND RF STERILE (DRAPES) IMPLANT
DECANTER SPIKE VIAL GLASS SM (MISCELLANEOUS) ×3 IMPLANT
DERMABOND ADVANCED (GAUZE/BANDAGES/DRESSINGS) ×2
DERMABOND ADVANCED .7 DNX12 (GAUZE/BANDAGES/DRESSINGS) ×1 IMPLANT
ELECT REM PT RETURN 9FT ADLT (ELECTROSURGICAL) ×3
ELECTRODE REM PT RTRN 9FT ADLT (ELECTROSURGICAL) ×1 IMPLANT
GLOVE BIO SURGEON STRL SZ 6.5 (GLOVE) ×2 IMPLANT
GLOVE BIO SURGEON STRL SZ7 (GLOVE) ×3 IMPLANT
GLOVE BIO SURGEON STRL SZ7.5 (GLOVE) ×3 IMPLANT
GLOVE BIO SURGEONS STRL SZ 6.5 (GLOVE) ×1
GLOVE BIOGEL PI IND STRL 6.5 (GLOVE) ×2 IMPLANT
GLOVE BIOGEL PI IND STRL 7.0 (GLOVE) ×1 IMPLANT
GLOVE BIOGEL PI IND STRL 8 (GLOVE) ×1 IMPLANT
GLOVE BIOGEL PI INDICATOR 6.5 (GLOVE) ×4
GLOVE BIOGEL PI INDICATOR 7.0 (GLOVE) ×2
GLOVE BIOGEL PI INDICATOR 8 (GLOVE) ×2
GOWN STRL REUS W/ TWL LRG LVL3 (GOWN DISPOSABLE) ×4 IMPLANT
GOWN STRL REUS W/TWL LRG LVL3 (GOWN DISPOSABLE) ×8
GRAFT GORETEX STRT 4-7X45 (Vascular Products) ×3 IMPLANT
KIT BASIN OR (CUSTOM PROCEDURE TRAY) ×3 IMPLANT
KIT TURNOVER KIT B (KITS) ×3 IMPLANT
NS IRRIG 1000ML POUR BTL (IV SOLUTION) ×3 IMPLANT
PACK CV ACCESS (CUSTOM PROCEDURE TRAY) ×3 IMPLANT
PAD ARMBOARD 7.5X6 YLW CONV (MISCELLANEOUS) ×6 IMPLANT
SPONGE SURGIFOAM ABS GEL 100 (HEMOSTASIS) IMPLANT
SUT PROLENE 5 0 C 1 24 (SUTURE) ×3 IMPLANT
SUT PROLENE 6 0 BV (SUTURE) ×9 IMPLANT
SUT VIC AB 3-0 SH 27 (SUTURE) ×4
SUT VIC AB 3-0 SH 27X BRD (SUTURE) ×2 IMPLANT
SUT VIC AB 4-0 PS2 18 (SUTURE) ×3 IMPLANT
SUT VICRYL 4-0 PS2 18IN ABS (SUTURE) ×6 IMPLANT
SYR 20ML LL LF (SYRINGE) ×3 IMPLANT
SYR TOOMEY 50ML (SYRINGE) IMPLANT
TOWEL GREEN STERILE (TOWEL DISPOSABLE) ×3 IMPLANT
UNDERPAD 30X36 HEAVY ABSORB (UNDERPADS AND DIAPERS) ×3 IMPLANT
WATER STERILE IRR 1000ML POUR (IV SOLUTION) ×3 IMPLANT

## 2019-11-27 NOTE — Interval H&P Note (Signed)
History and Physical Interval Note:  11/27/2019 7:50 AM  Kathleen [name]  has presented today for surgery, with the diagnosis of END STAGE RENAL DISEASE.  The various methods of treatment have been discussed with the patient and family. After consideration of risks, benefits and other options for treatment, the patient has consented to  Procedure(s): INSERTION OF ARTERIOVENOUS (AV) GORE-TEX GRAFT ARM (Left) as a surgical intervention.  The patient's history has been reviewed, patient examined, no change in status, stable for surgery.  I have reviewed the patient's chart and labs.  Questions were answered to the patient's satisfaction.     [name]

## 2019-11-27 NOTE — Discharge Instructions (Signed)
° °  [facility] [place] ° °Discharge Instructions ° °AV Fistula or Graft Surgery for Dialysis Access ° °Please refer to the following instructions for your post-procedure care. Your surgeon or physician assistant will discuss any changes with you. ° °Activity ° °You may drive the day following your surgery, if you are comfortable and no longer taking prescription pain medication. Resume full activity as the soreness in your incision resolves. ° °Bathing/Showering ° °You may shower after you go home. Keep your incision dry for 48 hours. Do not soak in a bathtub, hot tub, or swim until the incision heals completely. You may not shower if you have a hemodialysis catheter. ° °Incision Care ° °Clean your incision with mild soap and water after 48 hours. Pat the area dry with a clean towel. You do not need a bandage unless otherwise instructed. Do not apply any ointments or creams to your incision. You may have skin glue on your incision. Do not peel it off. It will come off on its own in about one week. Your arm may swell a bit after surgery. To reduce swelling use pillows to elevate your arm so it is above your heart. Your doctor will tell you if you need to lightly wrap your arm with an ACE bandage. ° °Diet ° °Resume your normal diet. There are not special food restrictions following this procedure. In order to heal from your surgery, it is CRITICAL to get adequate nutrition. Your body requires vitamins, minerals, and protein. Vegetables are the best source of vitamins and minerals. Vegetables also provide the perfect balance of protein. Processed food has little nutritional value, so try to avoid this. ° °Medications ° °Resume taking all of your medications. If your incision is causing pain, you may take over-the counter pain relievers such as acetaminophen (Tylenol). If you were prescribed a stronger pain medication, please be aware these medications can cause nausea and constipation. Prevent  nausea by taking the medication with a snack or meal. Avoid constipation by drinking plenty of fluids and eating foods with high amount of fiber, such as fruits, vegetables, and grains. Do not take Tylenol if you are taking prescription pain medications. ° ° ° ° °Follow up °Your surgeon may want to see you in the office following your access surgery. If so, this will be arranged at the time of your surgery. ° °Please call us immediately for any of the following conditions: ° °Increased pain, redness, drainage (pus) from your incision site °Fever of 101 degrees or higher °Severe or worsening pain at your incision site °Hand pain or numbness. ° °Reduce your risk of vascular disease: ° °Stop smoking. If you would like help, call [facility] at 1-800-QUIT-NOW (1-800-784-8669) or [place] at [phone] ° °Manage your cholesterol °Maintain a desired weight °Control your diabetes °Keep your blood pressure down ° °Dialysis ° °It will take several weeks to several months for your new dialysis access to be ready for use. Your surgeon will determine when it is OK to use it. Your nephrologist will continue to direct your dialysis. You can continue to use your Permcath until your new access is ready for use. ° °If you have any questions, please call the office at [phone]. ° °

## 2019-11-27 NOTE — Anesthesia Postprocedure Evaluation (Signed)
Anesthesia Post Note  Patient: Kathleen [name]  Procedure(s) Performed: INSERTION OF ARTERIOVENOUS (AV) GORE-TEX GRAFT LEFT UPPER ARM (Left )     Patient location during evaluation: PACU Anesthesia Type: MAC Level of consciousness: awake and alert Pain management: pain level controlled Vital Signs Assessment: post-procedure vital signs reviewed and stable Respiratory status: spontaneous breathing, nonlabored ventilation, respiratory function stable and patient connected to nasal cannula oxygen Cardiovascular status: stable and blood pressure returned to baseline Postop Assessment: no apparent nausea or vomiting Anesthetic complications: no   No complications documented.  Last Vitals:  Vitals:   11/27/19 1139 11/27/19 1145  BP: 94/77   Pulse: 73 75  Resp: (!) 24 16  Temp:  (!) 36.4 C  SpO2: 99% 100%    Last Pain:  Vitals:   11/27/19 1139  PainSc: (P) 0-No pain                 Tiajuana Amass

## 2019-11-27 NOTE — Op Note (Signed)
    NAME: Kathleen [name]    MRN: [id number] DOB: 1961-04-20    DATE OF OPERATION: 11/27/2019  PREOP DIAGNOSIS:    End-stage renal disease  POSTOP DIAGNOSIS:    Same  PROCEDURE:    1.  Placement of new left upper arm AV graft (4-7 mm PTFE graft) 2.  Ligation of left upper arm fistula 3.  Excision of pseudoaneurysm of left upper arm fistula  SURGEON: Judeth Cornfield. Scot Dock, MD  ASSIST: Risa Grill, PA  ANESTHESIA: Local with sedation  EBL: Minimal  INDICATIONS:    [name] is a 59 y.o. female who presents for new access.  FINDINGS:   Excellent thrill at the completion of the procedure.  Doppler radial signal at the completion.  TECHNIQUE:   Patient was taken to the operating room and sedated by anesthesia.  The left upper extremity was prepped and draped in the usual sterile fashion.  An longitudinal incision was made over the brachial artery just above the anastomosis to the fistula after the skin was anesthetized.  Here the brachial artery was dissected free.  It was somewhat small.  A separate longitudinal incision was made beneath the axilla after the skin was anesthetized.  Here the high brachial vein was dissected free.  A tunnel was created between the 2 incisions after the skin was anesthetized.  A 4-7 mm PTFE graft was tunneled between the 2 incisions and the patient was heparinized.  The brachial artery was then clamped proximally and distally and a longitudinal arteriotomy was made.  A short segment of the 4 mm in the graft was excised, the graft spatulated and sewn end-to-side to the artery using continuous 6-0 Prolene suture.  The graft sent for the appropriate length for anastomosis to the high brachial vein.  The vein was ligated distally and spatulated proximally.  The graft was cut to the appropriate length, spatulated, and sewn into into the vein using 2 continuous 6-0 Prolene sutures.  I think patient was a good thrill in the fistula.  Next I  elected to excise the large pseudoaneurysm in the mid upper arm.  After the skin was anesthetized and incision was made over this area.  I dissected out the pseudoaneurysm and then excised this.  There was bleeding proximally, therefore therefore I ligated the fistula by dissecting out the fistula proximally and ligated the fistula with a 2-0 silk tie.  Hemostasis was obtained in the wounds.  Each of the wounds was closed with a deep layer of 3-0 Vicryl the skin closed with 4-0 Vicryl.  Dermabond was applied.  The patient tolerated procedure well was transferred to the recovery room in stable condition.  All needle and sponge counts were correct.  Given the complexity of the case a first assistant was necessary in order to expedient the procedure and safely perform the technical aspects of the operation.  [name], MD, FACS [facility]  DATE OF DICTATION:   11/27/2019

## 2019-11-27 NOTE — Anesthesia Procedure Notes (Signed)
Procedure Name: MAC Date/Time: 11/27/2019 9:10 AM Performed by: Barrington Ellison, CRNA Pre-anesthesia Checklist: Patient identified, Emergency Drugs available, Suction available and Patient being monitored Patient Re-evaluated:Patient Re-evaluated prior to induction Oxygen Delivery Method: Simple face mask

## 2019-11-27 NOTE — Progress Notes (Signed)
Patient aware of delay.  Patient resting in bed with no complaints.  Patient's friend in lobby and is updated on delay as well.

## 2019-11-27 NOTE — Transfer of Care (Signed)
Immediate Anesthesia Transfer of Care Note  Patient: Kathleen [name]  Procedure(s) Performed: INSERTION OF ARTERIOVENOUS (AV) GORE-TEX GRAFT LEFT UPPER ARM (Left )  Patient Location: PACU  Anesthesia Type:MAC  Level of Consciousness: awake, alert  and oriented  Airway & Oxygen Therapy: Patient Spontanous Breathing  Post-op Assessment: Report given to RN  Post vital signs: Reviewed and stable  Last Vitals:  Vitals Value Taken Time  BP 97/68 11/27/19 1109  Temp    Pulse 33 11/27/19 1110  Resp 17 11/27/19 1110  SpO2 91 % 11/27/19 1110  Vitals shown include unvalidated device data.  Last Pain:  Vitals:   11/27/19 0646  PainSc: 5       Patients Stated Pain Goal: 2 (44/51/46 0479)  Complications: No complications documented.

## 2019-11-28 ENCOUNTER — Encounter (HOSPITAL_COMMUNITY): Payer: Self-pay | Admitting: Vascular Surgery

## 2019-12-27 ENCOUNTER — Ambulatory Visit (INDEPENDENT_AMBULATORY_CARE_PROVIDER_SITE_OTHER): Payer: Medicare Other | Admitting: Physician Assistant

## 2019-12-27 ENCOUNTER — Other Ambulatory Visit: Payer: Self-pay

## 2019-12-27 VITALS — BP 108/80 | HR 98 | Temp 98.2°F | Resp 20 | Ht 64.0 in | Wt 107.1 lb

## 2019-12-27 DIAGNOSIS — N186 End stage renal disease: Secondary | ICD-10-CM

## 2019-12-27 NOTE — Progress Notes (Signed)
    Postoperative Access Visit   History of Present Illness   Kathleen [name] is a 59 y.o. year old female who presents for postoperative follow-up for: left upper arm arteriovenous graft 11/27/19 by Dr. Scot Dock. Patient also had ligation of left upper arm fistula and excision of pseudoaneurysm of the left upper arm fistula.  The patient's wounds are healing well.  The patient notes no steal symptoms. She has expected soreness in area of graft.  The patient is able to complete their activities of daily living.   The patient dialyzes on Tuesdays, Thursdays, and Saturdays.  She has a functioning right IJ tunneled dialysis catheter.She goes to the [facility] on [address]:   12/27/19 1332  BP: 108/80  Pulse: 98  Resp: 20  Temp: 98.2 F (36.8 C)  TempSrc: Temporal  SpO2: 98%  Weight: 107 lb 1.6 oz (48.6 kg)  Height: 5\' 4"  (1.626 m)   Body mass index is 18.38 kg/m.  left arm Incisions are healing well, 2+ radial pulse, hand grip is 5/5, sensation in digits is intact, palpable thrill, bruit can be auscultated. Expected soreness along tunneled area of graft. No swelling or hematoma. No ecchymosis or erythema    Medical Decision Making   Kathleen [name] is a 59 y.o. year old female who presents s/p left upper arm arteriovenous graft 11/27/19 by Dr. Scot Dock. Patient also had ligation of left upper arm fistula and excision of pseudoaneurysm of the left upper arm fistula.  Left arm incisions are healing well. She does not have any swelling, pain, numbness or tingling   Patent is without signs or symptoms of steal syndrome  The patient's access will be ready for use after 12/28/19  The patient's tunneled dialysis catheter can be removed when Nephrology is comfortable with the performance of the left AV graft. [facility] so she will return there to have it removed when ready  The patient may follow up on a prn basis   Karoline Caldwell, PA-C [facility] [place] Office: [phone]  Clinic MD: Dr. Scot Dock

## 2020-01-16 ENCOUNTER — Telehealth: Payer: Self-pay

## 2020-01-16 NOTE — Telephone Encounter (Signed)
Will from [facility] called regarding pt needing declot of her HD access. He is going to try calling IR for this.

## 2020-01-17 ENCOUNTER — Telehealth: Payer: Self-pay

## 2020-01-17 NOTE — Telephone Encounter (Signed)
Spoke to Will again at HD center. He called [facility] and pt is going to be seen there for declot. He will call us back if he needs any further assistance.

## 2020-01-22 ENCOUNTER — Other Ambulatory Visit: Payer: Self-pay

## 2020-01-22 DIAGNOSIS — N186 End stage renal disease: Secondary | ICD-10-CM

## 2020-01-29 ENCOUNTER — Other Ambulatory Visit: Payer: Self-pay

## 2020-01-29 ENCOUNTER — Emergency Department (HOSPITAL_COMMUNITY)
Admission: EM | Admit: 2020-01-29 | Discharge: 2020-01-29 | Disposition: A | Discharge status: Home / Self Care | Payer: Medicare HMO | Attending: Emergency Medicine | Admitting: Emergency Medicine

## 2020-01-29 ENCOUNTER — Emergency Department (HOSPITAL_COMMUNITY): Payer: Medicare HMO

## 2020-01-29 DIAGNOSIS — Z20822 Contact with and (suspected) exposure to covid-19: Secondary | ICD-10-CM | POA: Insufficient documentation

## 2020-01-29 DIAGNOSIS — R112 Nausea with vomiting, unspecified: Secondary | ICD-10-CM | POA: Diagnosis present

## 2020-01-29 DIAGNOSIS — N186 End stage renal disease: Secondary | ICD-10-CM | POA: Diagnosis not present

## 2020-01-29 DIAGNOSIS — F17210 Nicotine dependence, cigarettes, uncomplicated: Secondary | ICD-10-CM | POA: Diagnosis not present

## 2020-01-29 DIAGNOSIS — J449 Chronic obstructive pulmonary disease, unspecified: Secondary | ICD-10-CM | POA: Diagnosis not present

## 2020-01-29 DIAGNOSIS — I120 Hypertensive chronic kidney disease with stage 5 chronic kidney disease or end stage renal disease: Secondary | ICD-10-CM | POA: Insufficient documentation

## 2020-01-29 DIAGNOSIS — Z992 Dependence on renal dialysis: Secondary | ICD-10-CM | POA: Diagnosis not present

## 2020-01-29 DIAGNOSIS — Z79899 Other long term (current) drug therapy: Secondary | ICD-10-CM | POA: Diagnosis not present

## 2020-01-29 DIAGNOSIS — R21 Rash and other nonspecific skin eruption: Secondary | ICD-10-CM | POA: Diagnosis not present

## 2020-01-29 LAB — COMPREHENSIVE METABOLIC PANEL
ALT: 10 U/L (ref 0–44)
AST: 22 U/L (ref 15–41)
Albumin: 3.8 g/dL (ref 3.5–5.0)
Alkaline Phosphatase: 55 U/L (ref 38–126)
Anion gap: 18 — ABNORMAL HIGH (ref 5–15)
BUN: 40 mg/dL — ABNORMAL HIGH (ref 6–20)
CO2: 28 mmol/L (ref 22–32)
Calcium: 9.4 mg/dL (ref 8.9–10.3)
Chloride: 87 mmol/L — ABNORMAL LOW (ref 98–111)
Creatinine, Ser: 12.12 mg/dL — ABNORMAL HIGH (ref 0.44–1.00)
GFR calc Af Amer: 3 mL/min — ABNORMAL LOW (ref >60)
GFR calc non Af Amer: 3 mL/min — ABNORMAL LOW (ref >60)
Glucose, Bld: 74 mg/dL (ref 70–99)
Potassium: 4.2 mmol/L (ref 3.5–5.1)
Sodium: 133 mmol/L — ABNORMAL LOW (ref 135–145)
Total Bilirubin: 0.2 mg/dL — ABNORMAL LOW (ref 0.3–1.2)
Total Protein: 7.5 g/dL (ref 6.5–8.1)

## 2020-01-29 LAB — SARS CORONAVIRUS 2 BY RT PCR (HOSPITAL ORDER, PERFORMED IN ~~LOC~~ HOSPITAL LAB): SARS Coronavirus 2: NEGATIVE

## 2020-01-29 LAB — CBC WITH DIFFERENTIAL/PLATELET
Abs Immature Granulocytes: 0.01 10*3/uL (ref 0.00–0.07)
Basophils Absolute: 0.1 10*3/uL (ref 0.0–0.1)
Basophils Relative: 1 %
Eosinophils Absolute: 0.2 10*3/uL (ref 0.0–0.5)
Eosinophils Relative: 3 %
HCT: 38.3 % (ref 36.0–46.0)
Hemoglobin: 11.4 g/dL — ABNORMAL LOW (ref 12.0–15.0)
Immature Granulocytes: 0 %
Lymphocytes Relative: 37 %
Lymphs Abs: 2 10*3/uL (ref 0.7–4.0)
MCH: 28.7 pg (ref 26.0–34.0)
MCHC: 29.8 g/dL — ABNORMAL LOW (ref 30.0–36.0)
MCV: 96.5 fL (ref 80.0–100.0)
Monocytes Absolute: 0.7 10*3/uL (ref 0.1–1.0)
Monocytes Relative: 13 %
Neutro Abs: 2.6 10*3/uL (ref 1.7–7.7)
Neutrophils Relative %: 46 %
Platelets: 225 10*3/uL (ref 150–400)
RBC: 3.97 MIL/uL (ref 3.87–5.11)
RDW: 15.4 % (ref 11.5–15.5)
WBC: 5.5 10*3/uL (ref 4.0–10.5)
nRBC: 0 % (ref 0.0–0.2)

## 2020-01-29 LAB — TROPONIN I (HIGH SENSITIVITY)
Troponin I (High Sensitivity): 15 ng/L (ref <18)
Troponin I (High Sensitivity): 15 ng/L (ref <18)

## 2020-01-29 MED ORDER — LORAZEPAM 1 MG PO TABS
0.5000 mg | ORAL_TABLET | Freq: Once | ORAL | Status: AC
  Administered 2020-01-29: 0.5 mg via ORAL
  Filled 2020-01-29: qty 1

## 2020-01-29 MED ORDER — HYDROCORTISONE 2.5 % EX LOTN: TOPICAL_LOTION | Freq: Two times a day (BID) | CUTANEOUS | 0 refills | Status: DC

## 2020-01-29 MED ORDER — VALACYCLOVIR HCL 1 G PO TABS: 500.0000 mg | ORAL_TABLET | Freq: Every day | ORAL | 0 refills | Status: AC

## 2020-01-29 MED ORDER — FENTANYL CITRATE (PF) 100 MCG/2ML IJ SOLN
50.0000 ug | Freq: Once | INTRAMUSCULAR | Status: AC
  Administered 2020-01-29: 50 ug via INTRAVENOUS
  Filled 2020-01-29: qty 2

## 2020-01-29 NOTE — ED Notes (Signed)
Discharge summary reviewed with pt, pt verbalizes understanding. Safe transport here for discharge ride home. Pt off unit via WC- No s/s of acute distress noted.

## 2020-01-29 NOTE — ED Provider Notes (Signed)
[facility] EMERGENCY DEPARTMENT Provider Note   CSN: [id number] Arrival date & time: 01/29/20  8366     History Chief Complaint  Patient presents with  . Nausea    Kathleen [name] is a 59 y.o. female.  HPI 59 year old female with a history of ESRD on hemodialysis Saturday Tuesday Thursday, hypertension, lupus, migraines, COPD, anemia presents to the ER with complaints of nonbloody, nonbilious vomiting since Sunday.  Patient states that she missed 2 days of dialysis last week, and was dialyzed on Saturday.  She developed some nausea and vomiting on Sunday.  Denies any abdominal pain, diarrhea, constipation, hematochezia.  She makes very little urine infrequently.  She also complains of a blistering rash to her right upper chest and some burning that goes down from her neck to her shoulder.  She denies any joint stiffness.  She denies any fevers or chills.  No chest pain or shortness of breath.  She has not noticed any leg swelling.  She has not noticed any rashes anywhere else on her body.  She has not noticed any redness, swelling or drainage to her port site.  She states that she has been taking her Plaquenil for lupus regularly.  She is followed by Dr. Veneta Penton w/ rheum.  She is vaccinated for Covid. No dizziness, blurred vision, ataxia.    Past Medical History:  Diagnosis Date  . Anemia   . Arthritis   . Cardiac arrest ([place])   . Cellulitis   . Cerebral vasculitis   . COPD (chronic obstructive pulmonary disease) ([place])   . Elevated antinuclear antibody (ANA) level   . Endometriosis   . ESRD (end stage renal disease) on dialysis ([place])   . Gallstones   . Hypertension   . Lupus ([place])   . Migraine   . PONV (postoperative nausea and vomiting)   . Seizures ([place])   . Sepsis ([place])   . Sleep apnea   . Suicide attempt ([place])   . TB (tuberculosis)   . Transient ischemic attack (TIA)   . Vasculitis of skin     Patient Active Problem List   Diagnosis Date Noted  .  Metatarsalgia of both feet 11/25/2018  . Onychomycosis 11/25/2018  . Pre-ulcerative calluses 11/25/2018  . Neuropathy 10/04/2018  . Headache, unspecified 10/03/2018  . Hypoglycemia, unspecified 06/06/2018  . Volume overload 05/30/2018  . Syncope 10/08/2017  . Acute encephalopathy 07/11/2017  . ESRD (end stage renal disease) ([place]) 07/11/2017  . COPD (chronic obstructive pulmonary disease) ([place])   . Anemia   . Coagulation defect, unspecified ([place]) 07/09/2017  . Encounter for fitting and adjustment of extracorporeal dialysis catheter ([place]) 07/09/2017  . 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 Iron deficiency anemia, unspecified 07/09/2017  . Secondary hyperparathyroidism of renal origin ([place]) 07/09/2017  . Tobacco use 07/09/2017  . Benign essential HTN 07/01/2017  . Stage 5 chronic kidney disease not on chronic dialysis ([place])   . Hypertensive crisis 06/29/2017  . Bilateral hip pain 04/04/2017  . PAD (peripheral artery disease) ([place]) 11/30/2016  . Tobacco dependence 11/30/2016  . Hypertensive emergency   . Lupus erythematosus   . Malnutrition of moderate degree 07/14/2016  . Lupus ([place]) 06/28/2016  . Hypertension 06/28/2016  . Kidney disease 06/28/2016  . Symptomatic anemia 06/28/2016  . Abdominal pain 06/28/2016  . Current non-adherence to medical treatment 06/28/2016  . Hypertensive urgency 06/28/2016  . SLE exacerbation ([place]) 06/28/2016  . Renal insufficiency   . 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 High risk medication use 05/31/2012    Past Surgical History:  Procedure Laterality Date  . AV FISTULA PLACEMENT Left 07/04/2017   Procedure: ARTERIOVENOUS (AV) FISTULA CREATION;  Surgeon: Conrad Saylorville, MD;  Location: [facility];  Service: Vascular;  Laterality: Left;  . AV FISTULA PLACEMENT Left 11/27/2019   Procedure: INSERTION OF ARTERIOVENOUS (AV) GORE-TEX GRAFT LEFT UPPER ARM;  Surgeon: Angelia Mould,  MD;  Location: [facility];  Service: Vascular;  Laterality: Left;  . CESAREAN SECTION    . ESOPHAGOGASTRODUODENOSCOPY N/A 07/15/2016   Procedure: ESOPHAGOGASTRODUODENOSCOPY (EGD);  Surgeon: Laurence Spates, MD;  Location: [facility] ENDOSCOPY;  Service: Endoscopy;  Laterality: N/A;  . INSERTION OF DIALYSIS CATHETER Right 07/04/2017   Procedure: INSERTION OF TUNNELED  DIALYSIS CATHETER - RIGHT INTERNAL JUGULAR PLACEMENT;  Surgeon: Conrad Screven, MD;  Location: [facility];  Service: Vascular;  Laterality: Right;  . LUNG BIOPSY    . TUBAL LIGATION       OB History   No obstetric history on file.     Family History  Problem Relation Age of Onset  . Ovarian cancer Mother   . Breast cancer Sister   . Throat cancer Brother     Social History   Tobacco Use  . Smoking status: Current Some Day Smoker    Packs/day: 0.25    Types: Cigarettes  . Smokeless tobacco: Never Used  Vaping Use  . Vaping Use: Never used  Substance Use Topics  . Alcohol use: No  . Drug use: No    Home Medications Prior to Admission medications   Medication Sig Start Date End Date Taking? Authorizing Provider  amLODipine (NORVASC) 5 MG tablet Take 1 tablet (5 mg total) by mouth at bedtime. Patient taking differently: Take 5 mg by mouth at bedtime as needed (high blood pressue).  05/31/18   Oswald Hillock, MD  HYDROcodone-acetaminophen (NORCO/VICODIN) 5-325 MG tablet Take 1 tablet by mouth every 4 (four) hours as needed for moderate pain. 11/27/19 11/26/20  Setzer, Edman Circle, PA-C  hydrocortisone 2.5 % lotion Apply topically 2 (two) times daily. 01/29/20   Garald Balding, PA-C  hydroxychloroquine (PLAQUENIL) 200 MG tablet Take 200 mg by mouth daily.  06/02/18   [provider]  Methoxy PEG-Epoetin Beta (MIRCERA IJ) Mircera 12/13/19 12/11/20  [provider]  sevelamer carbonate (RENVELA) 800 MG tablet Take 1,600 mg by mouth 3 (three) times daily with meals.  09/21/18   [provider]  valACYclovir (VALTREX) 1000 MG  tablet Take 0.5 tablets (500 mg total) by mouth daily for 10 days. 01/29/20 02/08/20  Garald Balding, PA-C  VITAMIN D PO Take by mouth. 12/18/19 12/16/20  [provider]    Allergies    Patient has no known allergies.  Review of Systems   Review of Systems  Constitutional: Negative for chills and fever.  HENT: Negative for ear pain and sore throat.   Eyes: Negative for pain and visual disturbance.  Respiratory: Negative for cough and shortness of breath.   Cardiovascular: Negative for chest pain and palpitations.  Gastrointestinal: Positive for nausea and vomiting. Negative for abdominal pain and diarrhea.  Genitourinary: Negative for dysuria and hematuria.  Musculoskeletal: Positive for neck pain. Negative for arthralgias and back pain.  Skin: Positive for rash. Negative for color change.  Neurological: Negative for seizures and syncope.  All other systems reviewed and are negative.   Physical Exam Updated Vital Signs BP (!) 143/89  Pulse 79   Temp (!) 97.5 F (36.4 C) (Oral)   Resp 15   Ht 5\' 4"  (1.626 m)   Wt 49.4 kg   SpO2 98%   BMI 18.71 kg/m   Physical Exam Vitals and nursing note reviewed.  Constitutional:      General: She is not in acute distress.    Appearance: She is well-developed. She is not ill-appearing, toxic-appearing or diaphoretic.  HENT:     Head: Normocephalic and atraumatic.  Eyes:     Conjunctiva/sclera: Conjunctivae normal.  Cardiovascular:     Rate and Rhythm: Normal rate and regular rhythm.     Heart sounds: No murmur heard.   Pulmonary:     Effort: Pulmonary effort is normal. No respiratory distress.     Breath sounds: Normal breath sounds.  Chest:       Comments: Right upper chest with evidence of 3-4 1cm diameter erythematous patches with evidenced of blistering. Skin turgor intact, no sloughing elsewhere.  Abdominal:     Palpations: Abdomen is soft.     Tenderness: There is no abdominal tenderness.  Musculoskeletal:         General: No swelling or tenderness.     Cervical back: Neck supple.     Right lower leg: No edema.     Left lower leg: No edema.     Comments: Right sided paraspinal cervical and trap tenderness to palpation. Full ROM of right shoulder. No LE swelling or edema . 2+radial pulses bilaterally     Skin:    General: Skin is warm and dry.     Findings: Lesion and rash present.     Comments: Dialysis port without evidence of erythema, swelling, discharge  Neurological:     General: No focal deficit present.     Mental Status: She is alert and oriented to person, place, and time.     Sensory: No sensory deficit.     Motor: No weakness.     ED Results / Procedures / Treatments   Labs (all labs ordered are listed, but only abnormal results are displayed) Labs Reviewed  CBC WITH DIFFERENTIAL/PLATELET - Abnormal; Notable for the following components:      Result Value   Hemoglobin 11.4 (*)    MCHC 29.8 (*)    All other components within normal limits  COMPREHENSIVE METABOLIC PANEL - Abnormal; Notable for the following components:   Sodium 133 (*)    Chloride 87 (*)    BUN 40 (*)    Creatinine, Ser 12.12 (*)    Total Bilirubin 0.2 (*)    GFR calc non Af Amer 3 (*)    GFR calc Af Amer 3 (*)    Anion gap 18 (*)    All other components within normal limits  SARS CORONAVIRUS 2 BY RT PCR (HOSPITAL ORDER, [facility])  C3 COMPLEMENT  C4 COMPLEMENT  ANTI-DNA ANTIBODY, DOUBLE-STRANDED  TROPONIN I (HIGH SENSITIVITY)  TROPONIN I (HIGH SENSITIVITY)    EKG EKG Interpretation  Date/Time:  Tuesday January 29 2020 07:48:37 EDT Ventricular Rate:  66 PR Interval:    QRS Duration: 87 QT Interval:  488 QTC Calculation: 512 R Axis:   30 Text Interpretation: Sinus rhythm Consider left atrial enlargement Anteroseptal infarct, age indeterminate ST elevation, consider inferior injury Prolonged QT interval TW inversion aVL new compared to most recent, artifact, no other  signifiant changes-similar TW morphology Confirmed by Gareth Morgan [phone]) on 01/29/2020 8:23:27 AM   Radiology DG  Chest Portable 1 View  Result Date: 01/29/2020 CLINICAL DATA:  Missed dialysis.  Cough. EXAM: PORTABLE CHEST 1 VIEW COMPARISON:  09/30/2018 FINDINGS: Mild hyperexpansion lungs, similar to prior. No consolidation. No appreciable edema. No visible pleural effusions. No pneumothorax. Cardiac size is normal. Normal mediastinal contour. Right IJ approach central venous catheter with the tip projecting at the right atrium. IMPRESSION: No acute cardiopulmonary disease. Electronically Signed   By: Margaretha Sheffield MD   On: 01/29/2020 07:51    Procedures Procedures (including critical care time)  Medications Ordered in ED Medications  fentaNYL (SUBLIMAZE) injection 50 mcg (50 mcg Intravenous Given 01/29/20 0856)  LORazepam (ATIVAN) tablet 0.5 mg (0.5 mg Oral Given 01/29/20 9179)    ED Course  I have reviewed the triage vital signs and the nursing notes.  Pertinent labs & imaging results that were available during my care of the patient were reviewed by me and considered in my medical decision making (see chart for details).  Clinical Course as of Jan 29 1104  Tue Jan 29, 2020  [id number]   [MB]  0919 Herion    [MB]    Clinical Course User Index [MB] Lyndel Safe   MDM Rules/Calculators/A&P                         59 year old female with complaints of nausea, vomiting, rash upper extremity On presentation, she is alert, oriented, nontoxic-appearing, no acute distress, speaking full sentences without increased work of breathing.  Abdomen is soft and nontender on exam.  Lung sounds clear.  There is a slightly blistering rash to her right upper chest wall surrounding the dialysis port.  Port itself without evidence of infection.  She has some paraspinal muscle tenderness starting in the cervical musculature and traveling down to her trapezius.  She has full range of  motion, neurovascularly intact in her upper extremity.  No evidence of fluid overload, no swelling of her lower extremities.   DDx includes Covid, viral gastroenteritis, lupus flare, fluid overload secondary to missing dialysis, dissection, shingles  CMP with mild hyponatremia 133, chloride of 87, renal function consistent with end-stage renal disease.  Normal liver function tests and albumin.  Slightly elevated anion gap of 18 consistent with ESRD and pending dialysis today.  CBC without leukocytosis, hemoglobin of 11.4 which appears to be stable.  Covid is negative.  Delta troponin negative, not indicative of carotid dissection given neck plain complaint.  Chest x-ray without abnormality.  EKG with prolonged QT but no significant changes from previous.  Abdomen is soft and nontender, I do not think she has an acute abdomen and does not need imaging at this time. Unable to assess urine given patient makes urine infrequently. Denies fever or chills.   Consulted Dr.Herion w/ Dr. Demetrio Lapping rheumatology group.  We discussed the patient's presentation, lab work, previous history, and he feels as though given that the patient does not have a history of bullous lupus, he is not convinced that this is a lupus flare.  He recommends a C3, C4 complement, anti-DNA double-stranded to be ordered, and likely start the patient on topical steroids.  We discussed also starting her on Valtrex given that the patient has a burning to the affected shoulder with the rash, although the rash does not look overly shingles-like.  I spoke with Jenny Reichmann PharmD about renal dosing, will send home with 500 mg once daily for 10 days.  The patient had also called Dr. Janeann Merl  group and scheduled an appointment on 10/4.  Dr. Carmin Muskrat stressed that she keep this appointment which I reiterated to the patient.  Nausea was treated with Ativan given patient's long QT on EKG.  She tolerated p.o. challenge well.  Will send home with hydrocortisone cream and  Valtrex, with strict follow-up with rheumatology.  I also stressed that the patient needs to continue to stay compliant with her dialysis days especially given she is on Valtrex.  She voiced understanding and is agreeable.  Return precautions discussed.  At this stage in the ED course, the patient has been medically screened and stable for discharge.  Patient was seen and evaluated by Dr. Billy Fischer who is agreeable to the above plan and disposition  Final Clinical Impression(s) / ED Diagnoses Final diagnoses:  Rash  Non-intractable vomiting with nausea, unspecified vomiting type    Rx / DC Orders ED Discharge Orders         Ordered    hydrocortisone 2.5 % lotion  2 times daily        01/29/20 0938    valACYclovir (VALTREX) 1000 MG tablet  Daily        01/29/20 0946           Garald Balding, PA-C 01/29/20 1106    Gareth Morgan, MD 01/29/20 2143

## 2020-01-29 NOTE — Discharge Instructions (Addendum)
Please make sure to use the hydrocortisone cream over your rash. Please also take the medication prescribed until finished. This medication is to treat possible shingles. Please make sure to keep your appointment on October 4 with Dr. Veneta Penton to discuss further management of your lupus. It is very important that you go to every dialysis session until you finish the valacyclovir in order to process it out of your body. Return to the ER for any new or worsening symptoms.

## 2020-01-29 NOTE — ED Triage Notes (Signed)
Pt states she started vomiting Sunday. State she had 5 episodes of vomiting yesterday. Pt is a dialysis pt tues, thurs,sat. Pt states she missed 2 dialysis treatments

## 2020-01-29 NOTE — Discharge Planning (Signed)
RNCM consulted regarding transportation needs for pt.  RNCM provided Safe Transportation ([place]) as pt has no transportation from hospital.

## 2020-01-29 NOTE — ED Notes (Signed)
Case management consulted for pt ride home, pt signed consent form for safe transport

## 2020-01-30 LAB — ANTI-DNA ANTIBODY, DOUBLE-STRANDED: ds DNA Ab: 2 IU/mL (ref 0–9)

## 2020-01-30 LAB — C3 COMPLEMENT: C3 Complement: 120 mg/dL (ref 82–167)

## 2020-01-30 LAB — C4 COMPLEMENT: Complement C4, Body Fluid: 18 mg/dL (ref 12–38)

## 2020-02-06 ENCOUNTER — Encounter (HOSPITAL_COMMUNITY): Payer: Medicare Other

## 2020-02-06 ENCOUNTER — Other Ambulatory Visit: Payer: Self-pay

## 2020-02-06 ENCOUNTER — Ambulatory Visit (INDEPENDENT_AMBULATORY_CARE_PROVIDER_SITE_OTHER): Payer: Self-pay | Admitting: Vascular Surgery

## 2020-02-06 ENCOUNTER — Encounter: Payer: Self-pay | Admitting: Vascular Surgery

## 2020-02-06 ENCOUNTER — Inpatient Hospital Stay (HOSPITAL_COMMUNITY): Admission: RE | Admit: 2020-02-06 | Payer: Medicare Other | Source: Ambulatory Visit

## 2020-02-06 VITALS — BP 107/74 | HR 89 | Temp 97.8°F | Resp 20 | Ht 64.0 in | Wt 107.0 lb

## 2020-02-06 DIAGNOSIS — N185 Chronic kidney disease, stage 5: Secondary | ICD-10-CM

## 2020-02-06 NOTE — Progress Notes (Signed)
REASON FOR CONSULT:    To evaluate for new hemodialysis access.  The consult is requested by nephrology.  ASSESSMENT & PLAN:   END-STAGE RENAL DISEASE: This patient had a left upper arm graft recently which clotted despite the fact that she had an excellent vein.  Her arteries were better in the left arm.  I think this likely clotted secondary to hypotension.  The patient states that she has had significant problems with hypotension.  Unfortunately she is a good candidate for access in the right arm she has some persistent paresthesias in the right hand with evidence of upper extremity arterial occlusive disease on the right.  She would therefore be at high risk for steal on the right side.  Therefore I think her next option for access would be a thigh graft.  However I would be reluctant to place a thigh graft until her blood pressure issue is improved.  She would be at high risk for graft thrombosis otherwise.  Once her blood pressure is better control we can arrange for a thigh graft.  I told her that she could select which side we put it on.  Of note she dialyzes on Tuesdays Thursdays and Saturdays.   [name], MD Office: [phone]   HPI:   Kathleen [name] is a pleasant 59 y.o. female, who has end-stage renal disease and dialyzes on Tuesdays Thursdays and Saturdays.  On 11/27/2019 she had a left upper arm graft placed.  This has subsequently occluded.  In reviewing the op note the vein was reasonable.  Her arteries were better on the left side than the right so this was her best option for upper extremity access.  She complains of paresthesias in the right hand she does have a history of scleroderma.  She denies any recent uremic symptoms.  She has had problems with her blood pressure being fairly low.  Past Medical History:  Diagnosis Date  . Anemia   . Arthritis   . Cardiac arrest ([place])   . Cellulitis   . Cerebral vasculitis   . COPD (chronic obstructive pulmonary  disease) ([place])   . Elevated antinuclear antibody (ANA) level   . Endometriosis   . ESRD (end stage renal disease) on dialysis ([place])   . Gallstones   . Hypertension   . Lupus ([place])   . Migraine   . PONV (postoperative nausea and vomiting)   . Seizures ([place])   . Sepsis ([place])   . Sleep apnea   . Suicide attempt ([place])   . TB (tuberculosis)   . Transient ischemic attack (TIA)   . Vasculitis of skin     Family History  Problem Relation Age of Onset  . Ovarian cancer Mother   . Breast cancer Sister   . Throat cancer Brother     SOCIAL HISTORY: Social History   Socioeconomic History  . Marital status: Single    Spouse name: Not on file  . Number of children: Not on file  . Years of education: Not on file  . Highest education level: Not on file  Occupational History  . Not on file  Tobacco Use  . Smoking status: Current Some Day Smoker    Packs/day: 0.25    Types: Cigarettes  . Smokeless tobacco: Never Used  Vaping Use  . Vaping Use: Never used  Substance and Sexual Activity  . Alcohol use: No  . Drug use: No  . Sexual activity: Not Currently  Other Topics Concern  . Not  on file  Social History Narrative  . Not on file   Social Determinants of Health   Financial Resource Strain:   . Difficulty of Paying Living Expenses: Not on file  Food Insecurity:   . Worried About Charity fundraiser in the Last Year: Not on file  . Ran Out of Food in the Last Year: Not on file  Transportation Needs:   . Lack of Transportation (Medical): Not on file  . Lack of Transportation (Non-Medical): Not on file  Physical Activity:   . Days of Exercise per Week: Not on file  . Minutes of Exercise per Session: Not on file  Stress:   . Feeling of Stress : Not on file  Social Connections:   . Frequency of Communication with Friends and Family: Not on file  . Frequency of Social Gatherings with Friends and Family: Not on file  . Attends Religious Services: Not on file  . Active  Member of Clubs or Organizations: Not on file  . Attends Archivist Meetings: Not on file  . Marital Status: Not on file  Intimate Partner Violence:   . Fear of Current or Ex-Partner: Not on file  . Emotionally Abused: Not on file  . Physically Abused: Not on file  . Sexually Abused: Not on file    No Known Allergies  Current Outpatient Medications  Medication Sig Dispense Refill  . amLODipine (NORVASC) 5 MG tablet Take 1 tablet (5 mg total) by mouth at bedtime. (Patient taking differently: Take 5 mg by mouth at bedtime as needed (high blood pressue). ) 30 tablet 3  . HYDROcodone-acetaminophen (NORCO/VICODIN) 5-325 MG tablet Take 1 tablet by mouth every 4 (four) hours as needed for moderate pain. 16 tablet 0  . hydrocortisone 2.5 % lotion Apply topically 2 (two) times daily. 59 mL 0  . hydroxychloroquine (PLAQUENIL) 200 MG tablet Take 200 mg by mouth daily.     . Methoxy PEG-Epoetin Beta (MIRCERA IJ) Mircera    . sevelamer carbonate (RENVELA) 800 MG tablet Take 1,600 mg by mouth 3 (three) times daily with meals.     . valACYclovir (VALTREX) 1000 MG tablet Take 0.5 tablets (500 mg total) by mouth daily for 10 days. 5 tablet 0  . VITAMIN D PO Take by mouth.     No current facility-administered medications for this visit.    REVIEW OF SYSTEMS:  [X]  denotes positive finding, [ ]  denotes negative finding Cardiac  Comments:  Chest pain or chest pressure:    Shortness of breath upon exertion:    Short of breath when lying flat:    Irregular heart rhythm:        Vascular    Pain in calf, thigh, or hip brought on by ambulation:    Pain in feet at night that wakes you up from your sleep:     Blood clot in your veins:    Leg swelling:         Pulmonary    Oxygen at home:    Productive cough:     Wheezing:         Neurologic    Sudden weakness in arms or legs:     Sudden numbness in arms or legs:     Sudden onset of difficulty speaking or slurred speech:    Temporary  loss of vision in one eye:     Problems with dizziness:         Gastrointestinal    Blood in stool:  Vomited blood:         Genitourinary    Burning when urinating:     Blood in urine:        Psychiatric    Major depression:         Hematologic    Bleeding problems:    Problems with blood clotting too easily:        Skin    Rashes or ulcers:        Constitutional    Fever or chills:     PHYSICAL EXAM:   Vitals:   02/06/20 1249  BP: 107/74  Pulse: 89  Resp: 20  Temp: 97.8 F (36.6 C)  SpO2: 95%  Weight: 107 lb (48.5 kg)  Height: 5\' 4"  (1.626 m)    GENERAL: The patient is a well-nourished female, in no acute distress. The vital signs are documented above. CARDIAC: There is a regular rate and rhythm.  VASCULAR:  In the right upper extremity I cannot palpate a radial pulse.  I cannot obtain a radial signal with the Doppler.  She has a brisk biphasic ulnar signal with the Doppler. In the left upper extremity she has a palpable radial pulse. She has palpable femoral pulses.  I cannot palpate pedal pulses.  She does have biphasic posterior tibial signals bilaterally.  I cannot get them right dorsalis pedis signal.  She has a monophasic left dorsalis pedis signal. She has no significant lower extremity swelling. PULMONARY: There is good air exchange bilaterally without wheezing or rales. ABDOMEN: Soft and non-tender with normal pitched bowel sounds.  MUSCULOSKELETAL: There are no major deformities or cyanosis. NEUROLOGIC: No focal weakness or paresthesias are detected. SKIN: There are no ulcers or rashes noted. PSYCHIATRIC: The patient has a normal affect.  DATA:    UPPER EXTREMITY ARTERIAL DUPLEX: I have independently interpreted the arterial duplex scan that was done on 10/31/2019.  On the right side there is a monophasic radial artery signal with a triphasic ulnar signal.  The brachial artery measures 0.4 cm in diameter.  On the left side there is a triphasic  radial signal with a biphasic ulnar signal.  The brachial artery measures 0.54 cm in diameter.  UPPER EXTREMITY VEIN MAP: I have independently interpreted the upper extremity vein map that was done on 10/31/2019.  On the right side the forearm and upper arm cephalic vein are very small.  The basilic vein in the upper arm is marginal.  On the left side the forearm cephalic vein is not identified.  The upper arm cephalic vein has thrombus.  The basilic vein is marginal in size.  LABS: I have reviewed the labs that were done on 01/29/2020.  Creatinine was 12.12.  GFR was 3.  Hemoglobin 11.4.  Platelets 225,000.

## 2020-04-15 ENCOUNTER — Other Ambulatory Visit: Payer: Self-pay

## 2020-04-15 DIAGNOSIS — I739 Peripheral vascular disease, unspecified: Secondary | ICD-10-CM

## 2020-04-23 ENCOUNTER — Ambulatory Visit (INDEPENDENT_AMBULATORY_CARE_PROVIDER_SITE_OTHER): Payer: Medicare Other | Admitting: Vascular Surgery

## 2020-04-23 ENCOUNTER — Ambulatory Visit (HOSPITAL_COMMUNITY)
Admission: RE | Admit: 2020-04-23 | Discharge: 2020-04-23 | Disposition: A | Discharge status: Home / Self Care | Payer: Medicare Other | Source: Ambulatory Visit | Attending: Vascular Surgery | Admitting: Vascular Surgery

## 2020-04-23 ENCOUNTER — Encounter: Payer: Self-pay | Admitting: Vascular Surgery

## 2020-04-23 ENCOUNTER — Other Ambulatory Visit: Payer: Self-pay

## 2020-04-23 VITALS — BP 123/86 | HR 91 | Temp 97.7°F | Resp 20 | Ht 64.0 in | Wt 108.0 lb

## 2020-04-23 DIAGNOSIS — N186 End stage renal disease: Secondary | ICD-10-CM | POA: Diagnosis not present

## 2020-04-23 DIAGNOSIS — I739 Peripheral vascular disease, unspecified: Secondary | ICD-10-CM | POA: Insufficient documentation

## 2020-04-23 NOTE — Progress Notes (Addendum)
Patient name: Kathleen [name] MRN: [id number] DOB: [birth year] Sex: female  REASON FOR VISIT:   The consult is for hemodialysis access.  The consult is requested by Dr. Edrick Oh.  HPI:   Kathleen [name] is a pleasant 59 y.o. female who I just saw on 02/06/2020 with end-stage renal disease.  Patient had a left upper arm graft placed on 11/27/2019 which clotted despite the fact that the patient had an excellent vein.  The arteries were better in the left arm.  I felt that this might of clotted secondary to hypotension.  The patient stated that she had been having significant problems with hypotension.  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  Thus she would be at high risk for steal on the right.  Therefore I felt the next option for access would be a thigh graft.  However I was reluctant to place a thigh graft until her blood pressure issue had improved.  When I saw her last I did examine her lower extremities.  She had palpable femoral pulses.  I could not palpate pedal pulses.  However she had biphasic posterior tibial signals bilaterally.  I could not get a right dorsalis pedis signal.  She had a monophasic left dorsalis pedis signal.  She comes in today to discuss hemodialysis access. We have discussed a thigh graft but she feels very strongly against a thigh graft. She would like [place] to try in the left arm 1 more time.  Of note she tells me that she has not had problems with hypotension recently.  Of note she dialyzes on Tuesdays Thursdays and Saturdays.  Current Outpatient Medications  Medication Sig Dispense Refill  . amLODipine (NORVASC) 5 MG tablet Take 1 tablet (5 mg total) by mouth at bedtime. (Patient taking differently: Take 5 mg by mouth at bedtime as needed (high blood pressue).) 30 tablet 3  . Cinacalcet HCl (SENSIPAR PO) Take by  mouth.    Marland Kitchen HYDROcodone-acetaminophen (NORCO/VICODIN) 5-325 MG tablet Take 1 tablet by mouth every 4 (four) hours as needed for moderate pain. 16 tablet 0  . hydrocortisone 2.5 % lotion Apply topically 2 (two) times daily. 59 mL 0  . hydroxychloroquine (PLAQUENIL) 200 MG tablet Take 200 mg by mouth daily.     . hydrOXYzine (ATARAX/VISTARIL) 25 MG tablet SMARTSIG:1 Tablet(s) By Mouth Every 12 Hours PRN    . Methoxy PEG-Epoetin Beta (MIRCERA IJ) Mircera    . multivitamin (RENA-VIT) TABS tablet Take by mouth.    . sevelamer carbonate (RENVELA) 800 MG tablet Take 1,600 mg by mouth 3 (three) times daily with meals.     Marland Kitchen VITAMIN D PO Take by mouth.     No current facility-administered medications for this visit.    REVIEW OF SYSTEMS:  [X]  denotes positive finding, [ ]  denotes negative finding Vascular    Leg swelling    Cardiac    Chest pain or chest pressure:    Shortness of breath upon exertion:    Short of breath when lying flat:    Irregular heart rhythm:    Constitutional    Fever or chills:     PHYSICAL EXAM:   Vitals:   04/23/20 1025  BP: 123/86  Pulse: 91  Resp: 20  Temp: 97.7 F (36.5 C)  SpO2: 99%  Weight: 108 lb (49 kg)  Height:  5\' 4"  (1.626 m)    GENERAL: The patient is a well-nourished female, in no acute distress. The vital signs are documented above. CARDIOVASCULAR: There is a regular rate and rhythm. PULMONARY: There is good air exchange bilaterally without wheezing or rales. VASCULAR: I cannot palpate a right radial pulse. She has a palpable left radial pulse. This is slightly diminished. Her incision in the axilla is not too high so I think we could potentially get higher if we attempt 1 more upper arm graft on the left.  DATA:   ARTERIAL DOPPLER STUDY: I have independently interpreted her arterial Doppler study.  On the right side there is a triphasic posterior tibial signal with an ABI about 99%. The right dorsalis pedis signal is dampened but  biphasic.  On the left side there is a triphasic posterior tibial signal and a biphasic dorsalis pedis signal. ABIs 99%.  MEDICAL ISSUES:   END-STAGE RENAL DISEASE: This patient feels strongly about not having a thigh graft. For this reason I think it would be reasonable to attempt a redo left upper arm graft. I have explained however that given that this is already fairly high this may not be successful. She does have a functioning catheter. She dialyzes on Tuesdays Thursdays and Saturdays. We will plan on doing a redo left upper arm graft on 05/12/2020. I discussed the procedure potential complications and she is agreeable to proceed.  [name] [facility] [phone]

## 2020-05-07 ENCOUNTER — Encounter (HOSPITAL_COMMUNITY): Payer: Self-pay | Admitting: Vascular Surgery

## 2020-05-07 NOTE — Progress Notes (Addendum)
Anesthesia Chart Review: Kathleen [name]   Case: [id number] Date/Time: 05/12/20 0815   Procedure: INSERTION OF LEFT UPPER ARM ARTERIOVENOUS (AV) GORE-TEX GRAFT (Left )   Anesthesia type: Monitor Anesthesia Care   Pre-op diagnosis: ESRD   Location: [facility] OR ROOM 12 / [place] OR   Surgeons: Angelia Mould, MD      DISCUSSION: Patient is a 59 year old female scheduled for the above procedure. Patient is currently using a tunneled dialysis catheter for dialysis and needs permanent access. She does not want a thigh graft, so will undergo another attempt at LUE HD access. (Her RUE was felt to be high risk for steal.)  History includes smoking, post-operative N/V, HTN, Lupus (SLE diagnosed ~ age 20; discoid lupus erythematosus alopecia 2005 as outlined Care Everywhere), TIA/CVA (chronic small vessel disease, old lacunar infarcts, small old left cerebellar infarct 10/09/17 brain MRI), cerebral vasculitis ("remote"), COPD, endometriosis, anemia, TB (1980's), suicide attempt (1980's), migraines, seizures, OSA, sepsis (1980's), ESRD (hemodialysis initiated 07/04/17; HD TTS), ectopic pregnancy surgery. Cardiac arrest history is listed (first noted as "History of cardiac arrest" in 05/23/12 [facility] neurology records, reportedly patient is not aware of this history;LVEF 55-60% without regional wall motion abnormalities 10/2017 echo).  ED visit 01/29/20 for N/V in setting of missing 2 of her 3 last hemodialysis sessions and for blistering rash of right shoulder with known discoid lupus. No chest pain or SOB. Troponins negative x2. Covid-19 test negative. ED provider contacted Dr. Carmin Muskrat, partner to her rheumatologist Dr. Veneta Penton. Based on description, felt likely not bullous lupus. C3 & C4 complement and anti-DNA double stranded labs recommended (and were WNL) with consideration of topical steroid and/or treatment for shingles although not a typical appearing shingles rash. She was discharged home with hydrocortisone cream and  Valtrex. Patient to follow-up with Dr. Veneta Penton on 02/11/20.  Preoperative COVID-19 testing is scheduled for 05/09/20. Anesthesia team to evaluate on the day of surgery.   VS:  BP Readings from Last 3 Encounters:  04/23/20 123/86  02/06/20 107/74  01/29/20 (!) 143/89   Pulse Readings from Last 3 Encounters:  04/23/20 91  02/06/20 89  01/29/20 79    PROVIDERS: Angela Burke, NP is listed as PCP - Ang, Simona Huh, MD is rheumatologist   LABS: For ISTAT on arrival. Most recent results include: Lab Results  Component Value Date   WBC 5.5 01/29/2020   HGB 11.4 (L) 01/29/2020   HCT 38.3 01/29/2020   PLT 225 01/29/2020   GLUCOSE 74 01/29/2020   ALT 10 01/29/2020   AST 22 01/29/2020   NA 133 (L) 01/29/2020   K 4.2 01/29/2020   CL 87 (L) 01/29/2020   CREATININE 12.12 (H) 01/29/2020   BUN 40 (H) 01/29/2020   CO2 28 01/29/2020   INR 1.04 07/04/2017    IMAGES: 1V CXR 01/29/20: FINDINGS: Mild hyperexpansion lungs, similar to prior. No consolidation. No appreciable edema. No visible pleural effusions. No pneumothorax. Cardiac size is normal. Normal mediastinal contour. Right IJ approach central venous catheter with the tip projecting at the right atrium. IMPRESSION: No acute cardiopulmonary disease.   OTHER: EEG 10/10/17: IMPRESSION: This EEG demonstrated no focal, hemispheric, or lateralizing features and generally is within ranges of normal. There was an excessive amount of fast activity noted throughout the recording, which is often due to medication such as benzodiazepines. Correlate clinically. There was no epileptiform activity noted on this EEG.   EKG:  EKG 01/29/20:   Sinus rhythm Consider left atrial enlargement Anteroseptal infarct, age indeterminate  ST elevation, consider inferior injury Prolonged QT interval (QT 488/QTc 512 ms) TW inversion aVL new compared to most recent, artifact, no other signifiant changes-similar TW morphology Confirmed by Gareth Morgan [phone]) on 01/29/2020 8:23:27 AM - Mild baseline wanderer in multiple leads, worse in V6. No chest pain, normal HS Troponin x2. Negative T wave in aVL is also present on 09/30/18 tracing and T/ST changes also appear similar.  EKG 11/27/19: Normal sinus rhythm Possible Anterior infarct , age undetermined Abnormal ECG No significant change since last tracing Confirmed by Glori Bickers [phone]) on 11/27/2019 9:58:09 AM - QT/QTc 446/467 ms   CV: Echo 10/10/17 (for syncope post-dialysis): Study Conclusions  - Left ventricle: The cavity size was normal. Wall thickness was  increased in a pattern of mild LVH. Systolic function was normal.  The estimated ejection fraction was in the range of 55% to 60%.  Wall motion was normal; there were no regional wall motion  abnormalities. Doppler parameters are consistent with abnormal  left ventricular relaxation (grade 1 diastolic dysfunction).  - Aortic valve: Trileaflet; mildly thickened, mildly calcified  leaflets. There was mild regurgitation.  - Aorta: Ascending aortic diameter: 42 mm (S).  - Ascending aorta: The ascending aorta was mildly dilated.  - Mitral valve: Calcified annulus.  (Comparison 06/30/17 in setting of hypertensive emergency, pulmonary edema/hypoxia in setting of progression to ESRD requiring initiation of hemodialysis: mild LVH, LVEF 45-50%, diffuse hypokinesis, grade 2 DD, moderate AR, mild MR, LA severely dilated, RA mildly dilated)   Past Medical History:  Diagnosis Date  . Anemia   . Arthritis   . Cardiac arrest ([place])   . Cellulitis   . Cerebral vasculitis   . COPD (chronic obstructive pulmonary disease) ([place])   . Elevated antinuclear antibody (ANA) level   . Endometriosis   . ESRD (end stage renal disease) on dialysis ([place])   . Gallstones   . Hypertension   . Lupus ([place])   . Migraine   . PONV (postoperative nausea and vomiting)   . Seizures ([place])   . Sepsis ([place])   . Sleep apnea   . Suicide  attempt ([place])   . TB (tuberculosis)   . Transient ischemic attack (TIA)   . Vasculitis of skin     Past Surgical History:  Procedure Laterality Date  . AV FISTULA PLACEMENT Left 07/04/2017   Procedure: ARTERIOVENOUS (AV) FISTULA CREATION;  Surgeon: Conrad Falfurrias, MD;  Location: [place];  Service: Vascular;  Laterality: Left;  . AV FISTULA PLACEMENT Left 11/27/2019   Procedure: INSERTION OF ARTERIOVENOUS (AV) GORE-TEX GRAFT LEFT UPPER ARM;  Surgeon: Angelia Mould, MD;  Location: [place];  Service: Vascular;  Laterality: Left;  . CESAREAN SECTION    . ESOPHAGOGASTRODUODENOSCOPY N/A 07/15/2016   Procedure: ESOPHAGOGASTRODUODENOSCOPY (EGD);  Surgeon: Laurence Spates, MD;  Location: [facility] ENDOSCOPY;  Service: Endoscopy;  Laterality: N/A;  . INSERTION OF DIALYSIS CATHETER Right 07/04/2017   Procedure: INSERTION OF TUNNELED  DIALYSIS CATHETER - RIGHT INTERNAL JUGULAR PLACEMENT;  Surgeon: Conrad Lockhart, MD;  Location: [place];  Service: Vascular;  Laterality: Right;  . LUNG BIOPSY    . TUBAL LIGATION      MEDICATIONS: No current facility-administered medications for this encounter.   Marland Kitchen amLODipine (NORVASC) 5 MG tablet  . Cinacalcet HCl (SENSIPAR PO)  . HYDROcodone-acetaminophen (NORCO/VICODIN) 5-325 MG tablet  . hydrocortisone 2.5 % lotion  . hydroxychloroquine (PLAQUENIL) 200 MG tablet  . hydrOXYzine (ATARAX/VISTARIL) 25 MG tablet  . Methoxy PEG-Epoetin Beta (MIRCERA IJ)  .  multivitamin (RENA-VIT) TABS tablet  . sevelamer carbonate (RENVELA) 800 MG tablet  . VITAMIN D PO    Myra Gianotti, PA-C Surgical Short Stay/Anesthesiology [facility] Phone [phone] [facility] Phone [phone] 05/08/2020 9:16 AM

## 2020-05-08 NOTE — Anesthesia Preprocedure Evaluation (Deleted)
Anesthesia Evaluation    History of Anesthesia Complications (+) PONV  Airway        Dental   Pulmonary COPD, Current Smoker,           Cardiovascular hypertension, Pt. on medications   '19 ECHO: EF 55-60%, grade 1 DD, mild AI   Neuro/Psych  Headaches, TIA   GI/Hepatic   Endo/Other  SLE  Renal/GU Dialysis and ESRFRenal disease     Musculoskeletal  (+) Arthritis ,   Abdominal   Peds  Hematology   Anesthesia Other Findings   Reproductive/Obstetrics                            Anesthesia Physical Anesthesia Plan  ASA: III  Anesthesia Plan: General   Post-op Pain Management:    Induction: Intravenous  PONV Risk Score and Plan: 3 and Ondansetron, Dexamethasone and Treatment may vary due to age or medical condition  Airway Management Planned: LMA  Additional Equipment: None  Intra-op Plan:   Post-operative Plan:   Informed Consent:   Plan Discussed with:   Anesthesia Plan Comments: (PAT note written 05/08/2020 by Myra Gianotti, PA-C. )       Anesthesia Quick Evaluation

## 2020-05-08 NOTE — Progress Notes (Signed)
I called Kathleen [name] several times today, it rings with no voice mail and cuts off. I called patient's emergency contact, that phone did not ring.

## 2020-05-09 ENCOUNTER — Inpatient Hospital Stay (HOSPITAL_COMMUNITY): Admission: RE | Admit: 2020-05-09 | Payer: Medicare Other | Source: Ambulatory Visit

## 2020-05-12 ENCOUNTER — Ambulatory Visit (HOSPITAL_COMMUNITY): Admission: RE | Admit: 2020-05-12 | Payer: Medicare Other | Source: Home / Self Care | Admitting: Vascular Surgery

## 2020-05-12 ENCOUNTER — Encounter (HOSPITAL_COMMUNITY): Admission: RE | Payer: Self-pay | Source: Home / Self Care

## 2020-05-12 SURGERY — INSERTION OF ARTERIOVENOUS (AV) GORE-TEX GRAFT ARM
Anesthesia: Monitor Anesthesia Care | Laterality: Left

## 2020-07-17 ENCOUNTER — Other Ambulatory Visit: Payer: Self-pay

## 2020-07-25 ENCOUNTER — Other Ambulatory Visit (HOSPITAL_COMMUNITY)
Admission: RE | Admit: 2020-07-25 | Discharge: 2020-07-25 | Disposition: A | Discharge status: Home / Self Care | Payer: Medicare Other | Source: Ambulatory Visit | Attending: Vascular Surgery | Admitting: Vascular Surgery

## 2020-07-25 DIAGNOSIS — Z20822 Contact with and (suspected) exposure to covid-19: Secondary | ICD-10-CM | POA: Insufficient documentation

## 2020-07-25 DIAGNOSIS — Z01812 Encounter for preprocedural laboratory examination: Secondary | ICD-10-CM | POA: Diagnosis present

## 2020-07-25 LAB — SARS CORONAVIRUS 2 (TAT 6-24 HRS): SARS Coronavirus 2: NEGATIVE

## 2020-07-28 ENCOUNTER — Encounter (HOSPITAL_COMMUNITY): Payer: Self-pay | Admitting: Vascular Surgery

## 2020-07-28 NOTE — Anesthesia Preprocedure Evaluation (Addendum)
Anesthesia Evaluation  Patient identified by MRN, date of birth, ID band Patient awake    Reviewed: Allergy & Precautions, NPO status , Patient's Chart, lab work & pertinent test results  History of Anesthesia Complications (+) PONV  Airway Mallampati: II  TM Distance: >3 FB Neck ROM: Full    Dental  (+) Edentulous Upper, Edentulous Lower   Pulmonary sleep apnea , COPD, Current Smoker,    Pulmonary exam normal        Cardiovascular hypertension, Pt. on medications + Peripheral Vascular Disease   Rhythm:Regular Rate:Normal     Neuro/Psych  Headaches, Seizures -, Well Controlled,  CVA negative psych ROS   GI/Hepatic negative GI ROS, Neg liver ROS,   Endo/Other  negative endocrine ROS  Renal/GU ESRF and DialysisRenal disease  negative genitourinary   Musculoskeletal  (+) Arthritis , SLE   Abdominal (+)  Abdomen: soft. Bowel sounds: normal.  Peds  Hematology  (+) anemia ,   Anesthesia Other Findings   Reproductive/Obstetrics                           Anesthesia Physical Anesthesia Plan  ASA: III  Anesthesia Plan: MAC and Regional   Post-op Pain Management:    Induction: Intravenous  PONV Risk Score and Plan: 2 and Ondansetron, Propofol infusion, Dexamethasone, Midazolam and Treatment may vary due to age or medical condition  Airway Management Planned: Simple Face Mask, Natural Airway and Nasal Cannula  Additional Equipment: None  Intra-op Plan:   Post-operative Plan:   Informed Consent: I have reviewed the patients History and Physical, chart, labs and discussed the procedure including the risks, benefits and alternatives for the proposed anesthesia with the patient or authorized representative who has indicated his/her understanding and acceptance.     Dental advisory given  Plan Discussed with: CRNA  Anesthesia Plan Comments: (PAT note written 07/28/2020 by Myra Gianotti, PA-C.   Echo 10/10/17 (for syncope post-dialysis): Study Conclusions  - Left ventricle: The cavity size was normal. Wall thickness was  increased in a pattern of mild LVH. Systolic function was normal.  The estimated ejection fraction was in the range of 55% to 60%.  Wall motion was normal; there were no regional wall motion  abnormalities. Doppler parameters are consistent with abnormal  left ventricular relaxation (grade 1 diastolic dysfunction).  - Aortic valve: Trileaflet; mildly thickened, mildly calcified  leaflets. There was mild regurgitation.  - Aorta: Ascending aortic diameter: 42 mm (S).  - Ascending aorta: The ascending aorta was mildly dilated.  - Mitral valve: Calcified annulus.  (Comparison 06/30/17 in setting of hypertensive emergency, pulmonary edema/hypoxia in setting of progression to ESRD requiring initiation of hemodialysis: mild LVH, LVEF 45-50%, diffuse hypokinesis, grade 2 DD, moderate AR, mild MR, LA severely dilated, RA mildly dilated))      Anesthesia Quick Evaluation

## 2020-07-28 NOTE — Progress Notes (Signed)
Anesthesia Chart Review: Kathleene Hazel   Case: [id number] Date/Time: 07/29/20 1033   Procedure: INSERTION OF LEFT UPPER ARM ARTERIOVENOUS (AV) GORE-TEX GRAFT (Left )   Anesthesia type: Choice   Pre-op diagnosis: ESRD   Location: [facility] OR ROOM 09 / [place] OR   Surgeons: Angelia Mould, MD      DISCUSSION: Patient is a 60 year old female scheduled for the above procedure. Per 04/23/20 vascular notes, patient is currently using a tunneled dialysis catheter for dialysis and needs permanent access. She does not want a thigh graft, so will undergo another attempt at LUE HD access. (Her RUE was felt to be high risk for steal.) Surgery was initially scheduled for 05/12/20 but appears it was cancelled prior to her arrival.   History includessmoking, post-operative N/V, HTN, Lupus (SLE diagnosed ~ age 10; discoid lupus erythematosus alopecia 2005 as outlined Care Everywhere), TIA/CVA (chronic small vessel disease, old lacunar infarcts, small old left cerebellar infarct 10/09/17 brain MRI), cerebral vasculitis ("remote"), COPD, endometriosis, anemia, TB (1980's), suicide attempt (1980's), migraines, seizures, OSA, sepsis (1980's), ESRD (hemodialysis initiated 07/04/17; HD TTS), ectopic pregnancy surgery. Cardiac arrest history is listed (first noted as "History of cardiac arrest" in 05/23/12 [facility] neurology records, reportedly patient is not aware of this history; LVEF 55-60% without regional wall motion abnormalities 10/2017 echo).  ED visit 01/29/20 for N/V in setting of missing 2 of her 3 last hemodialysis sessions and for blistering rash of right shoulder with known discoid lupus. No chest pain or SOB. Troponins negative x2. Covid-19 test negative. ED provider contacted Dr. Carmin Muskrat, partner to her rheumatologist Dr. Veneta Penton. Based on description, felt likely not bullous lupus. C3 & C4 complement and anti-DNA double stranded labs recommended (and were WNL) with consideration of topical steroid and/or treatment for  shingles although not a typical appearing shingles rash. She was discharged home with hydrocortisone cream and Valtrex with instruction to follow-up with Dr. Veneta Penton on 02/11/20.  Preoperative COVID-19 test negative on 07/25/20. Anesthesia team to evaluate on the day of surgery.   VS:  BP Readings from Last 3 Encounters:  04/23/20 123/86  02/06/20 107/74  01/29/20 (!) 143/89   Pulse Readings from Last 3 Encounters:  04/23/20 91  02/06/20 89  01/29/20 79    PROVIDERS: Angela Burke, NP is listed as PCP - Edrick Oh, MD is nephrologist - Ang, Simona Huh, MD is rheumatologist   LABS: For labs day of surgery.    OTHER: EEG 10/10/17: IMPRESSION: This EEG demonstrated no focal, hemispheric, or lateralizing features and generally is within ranges of normal. There was an excessive amount of fast activity noted throughout the recording, which is often due to medication such as benzodiazepines. Correlate clinically. There was no epileptiform activity noted on this EEG.   IMAGES: 1V CXR 01/29/20: FINDINGS: Mild hyperexpansion lungs, similar to prior. No consolidation. No appreciable edema. No visible pleural effusions. No pneumothorax. Cardiac size is normal. Normal mediastinal contour. Right IJ approach central venous catheter with the tip projecting at the right atrium. IMPRESSION: No acute cardiopulmonary disease.   EKG: EKG 01/29/20:  Sinus rhythm Consider left atrial enlargement Anteroseptal infarct, age indeterminate ST elevation, consider inferior injury Prolonged QT interval (QT 488/QTc 512 ms) TW inversion aVL new compared to most recent, artifact, no other signifiant changes-similar TW morphology Confirmed by Gareth Morgan [phone]) on 01/29/2020 8:23:27 AM - Mild baseline wanderer in multiple leads, worse in V6. No chest pain, normal HS Troponin x2. Negative T wave in aVL is also present on  09/30/18 tracing and T/ST changes also appear similar.  EKG  11/27/19: Normal sinus rhythm Possible Anterior infarct , age undetermined Abnormal ECG No significant change since last tracing Confirmed by Glori Bickers [phone]) on 11/27/2019 9:58:09 AM - QT/QTc 446/467 ms    CV: Echo 10/10/17 (for syncope post-dialysis): Study Conclusions  - Left ventricle: The cavity size was normal. Wall thickness was  increased in a pattern of mild LVH. Systolic function was normal.  The estimated ejection fraction was in the range of 55% to 60%.  Wall motion was normal; there were no regional wall motion  abnormalities. Doppler parameters are consistent with abnormal  left ventricular relaxation (grade 1 diastolic dysfunction).  - Aortic valve: Trileaflet; mildly thickened, mildly calcified  leaflets. There was mild regurgitation.  - Aorta: Ascending aortic diameter: 42 mm (S).  - Ascending aorta: The ascending aorta was mildly dilated.  - Mitral valve: Calcified annulus.  (Comparison 06/30/17 in setting of hypertensive emergency, pulmonary edema/hypoxia in setting of progression to ESRD requiring initiation of hemodialysis: mild LVH, LVEF 45-50%, diffuse hypokinesis, grade 2 DD, moderate AR, mild MR, LA severely dilated, RA mildly dilated)   Past Medical History:  Diagnosis Date  . Anemia   . Arthritis   . Cardiac arrest ([place])   . Cellulitis   . Cerebral vasculitis   . COPD (chronic obstructive pulmonary disease) ([place])   . Elevated antinuclear antibody (ANA) level   . Endometriosis   . ESRD (end stage renal disease) on dialysis ([place])   . Gallstones   . Hypertension   . Lupus ([place])   . Migraine   . PONV (postoperative nausea and vomiting)   . Seizures ([place])   . Sepsis ([place])   . Sleep apnea   . Suicide attempt ([place])   . TB (tuberculosis)   . Transient ischemic attack (TIA)   . Vasculitis of skin     Past Surgical History:  Procedure Laterality Date  . AV FISTULA PLACEMENT Left 07/04/2017   Procedure: ARTERIOVENOUS (AV) FISTULA  CREATION;  Surgeon: Conrad Shamrock, MD;  Location: [place];  Service: Vascular;  Laterality: Left;  . AV FISTULA PLACEMENT Left 11/27/2019   Procedure: INSERTION OF ARTERIOVENOUS (AV) GORE-TEX GRAFT LEFT UPPER ARM;  Surgeon: Angelia Mould, MD;  Location: [place];  Service: Vascular;  Laterality: Left;  . CESAREAN SECTION    . ESOPHAGOGASTRODUODENOSCOPY N/A 07/15/2016   Procedure: ESOPHAGOGASTRODUODENOSCOPY (EGD);  Surgeon: Laurence Spates, MD;  Location: [facility] ENDOSCOPY;  Service: Endoscopy;  Laterality: N/A;  . INSERTION OF DIALYSIS CATHETER Right 07/04/2017   Procedure: INSERTION OF TUNNELED  DIALYSIS CATHETER - RIGHT INTERNAL JUGULAR PLACEMENT;  Surgeon: Conrad [place], MD;  Location: [place];  Service: Vascular;  Laterality: Right;  . LUNG BIOPSY    . TUBAL LIGATION      MEDICATIONS: No current facility-administered medications for this encounter.   Marland Kitchen amLODipine (NORVASC) 5 MG tablet  . Cinacalcet HCl (SENSIPAR PO)  . HYDROcodone-acetaminophen (NORCO/VICODIN) 5-325 MG tablet  . hydrocortisone 2.5 % lotion  . hydroxychloroquine (PLAQUENIL) 200 MG tablet  . hydrOXYzine (ATARAX/VISTARIL) 25 MG tablet  . Methoxy PEG-Epoetin Beta (MIRCERA IJ)  . multivitamin (RENA-VIT) TABS tablet  . sevelamer carbonate (RENVELA) 800 MG tablet  . VITAMIN D PO    Myra Gianotti, PA-C Surgical Short Stay/Anesthesiology [facility] Phone [phone] [facility] Phone [phone] 07/28/2020 12:34 PM

## 2020-07-29 ENCOUNTER — Ambulatory Visit (HOSPITAL_COMMUNITY): Payer: Medicare Other | Admitting: Vascular Surgery

## 2020-07-29 ENCOUNTER — Ambulatory Visit (HOSPITAL_COMMUNITY)
Admission: RE | Admit: 2020-07-29 | Discharge: 2020-07-29 | Disposition: A | Discharge status: Home / Self Care | Payer: Medicare Other | Attending: Vascular Surgery | Admitting: Vascular Surgery

## 2020-07-29 ENCOUNTER — Encounter (HOSPITAL_COMMUNITY)
Admission: RE | Disposition: A | Discharge status: Home / Self Care | Payer: Self-pay | Source: Home / Self Care | Attending: Vascular Surgery

## 2020-07-29 ENCOUNTER — Other Ambulatory Visit: Payer: Self-pay

## 2020-07-29 ENCOUNTER — Encounter (HOSPITAL_COMMUNITY): Payer: Self-pay | Admitting: Vascular Surgery

## 2020-07-29 DIAGNOSIS — I120 Hypertensive chronic kidney disease with stage 5 chronic kidney disease or end stage renal disease: Secondary | ICD-10-CM | POA: Diagnosis not present

## 2020-07-29 DIAGNOSIS — N186 End stage renal disease: Secondary | ICD-10-CM | POA: Diagnosis not present

## 2020-07-29 DIAGNOSIS — Z992 Dependence on renal dialysis: Secondary | ICD-10-CM | POA: Insufficient documentation

## 2020-07-29 DIAGNOSIS — F17210 Nicotine dependence, cigarettes, uncomplicated: Secondary | ICD-10-CM | POA: Diagnosis not present

## 2020-07-29 DIAGNOSIS — Y841 Kidney dialysis as the cause of abnormal reaction of the patient, or of later complication, without mention of misadventure at the time of the procedure: Secondary | ICD-10-CM | POA: Diagnosis not present

## 2020-07-29 DIAGNOSIS — N185 Chronic kidney disease, stage 5: Secondary | ICD-10-CM | POA: Diagnosis not present

## 2020-07-29 DIAGNOSIS — T82510A Breakdown (mechanical) of surgically created arteriovenous fistula, initial encounter: Secondary | ICD-10-CM | POA: Insufficient documentation

## 2020-07-29 HISTORY — PX: AV FISTULA PLACEMENT: SHX1204

## 2020-07-29 LAB — POCT I-STAT, CHEM 8
BUN: 48 mg/dL — ABNORMAL HIGH (ref 6–20)
Calcium, Ion: 1.07 mmol/L — ABNORMAL LOW (ref 1.15–1.40)
Chloride: 101 mmol/L (ref 98–111)
Creatinine, Ser: 12.5 mg/dL — ABNORMAL HIGH (ref 0.44–1.00)
Glucose, Bld: 70 mg/dL (ref 70–99)
HCT: 40 % (ref 36.0–46.0)
Hemoglobin: 13.6 g/dL (ref 12.0–15.0)
Potassium: 5.1 mmol/L (ref 3.5–5.1)
Sodium: 137 mmol/L (ref 135–145)
TCO2: 29 mmol/L (ref 22–32)

## 2020-07-29 SURGERY — INSERTION OF ARTERIOVENOUS (AV) GORE-TEX GRAFT ARM
Anesthesia: Monitor Anesthesia Care | Site: Arm Upper | Laterality: Left

## 2020-07-29 MED ORDER — FENTANYL CITRATE (PF) 100 MCG/2ML IJ SOLN: 50.0000 ug | Freq: Once | INTRAMUSCULAR | Status: AC

## 2020-07-29 MED ORDER — 0.9 % SODIUM CHLORIDE (POUR BTL) OPTIME
TOPICAL | Status: DC | PRN
  Administered 2020-07-29: 1000 mL

## 2020-07-29 MED ORDER — LIDOCAINE 2% (20 MG/ML) 5 ML SYRINGE
INTRAMUSCULAR | Status: DC | PRN
  Administered 2020-07-29: 30 mg via INTRAVENOUS

## 2020-07-29 MED ORDER — PROPOFOL 500 MG/50ML IV EMUL
INTRAVENOUS | Status: DC | PRN
  Administered 2020-07-29: 75 ug/kg/min via INTRAVENOUS

## 2020-07-29 MED ORDER — FENTANYL CITRATE (PF) 250 MCG/5ML IJ SOLN
INTRAMUSCULAR | Status: AC
  Filled 2020-07-29: qty 5

## 2020-07-29 MED ORDER — ONDANSETRON HCL 4 MG/2ML IJ SOLN
INTRAMUSCULAR | Status: DC | PRN
  Administered 2020-07-29: 4 mg via INTRAVENOUS

## 2020-07-29 MED ORDER — MIDAZOLAM HCL 2 MG/2ML IJ SOLN
INTRAMUSCULAR | Status: AC
  Administered 2020-07-29: 1 mg
  Filled 2020-07-29: qty 2

## 2020-07-29 MED ORDER — STERILE WATER FOR IRRIGATION IR SOLN
Status: DC | PRN
  Administered 2020-07-29: 1000 mL

## 2020-07-29 MED ORDER — CHLORHEXIDINE GLUCONATE 0.12 % MT SOLN
OROMUCOSAL | Status: AC
  Administered 2020-07-29: 15 mL via OROMUCOSAL
  Filled 2020-07-29: qty 15

## 2020-07-29 MED ORDER — LIDOCAINE-EPINEPHRINE (PF) 1.5 %-1:200000 IJ SOLN
INTRAMUSCULAR | Status: DC | PRN
  Administered 2020-07-29: 20 mL via PERINEURAL

## 2020-07-29 MED ORDER — CEFAZOLIN SODIUM-DEXTROSE 2-4 GM/100ML-% IV SOLN
2.0000 g | INTRAVENOUS | Status: AC
  Administered 2020-07-29: 2 g via INTRAVENOUS

## 2020-07-29 MED ORDER — ORAL CARE MOUTH RINSE: 15.0000 mL | Freq: Once | OROMUCOSAL | Status: AC

## 2020-07-29 MED ORDER — HEPARIN SODIUM (PORCINE) 1000 UNIT/ML IJ SOLN
INTRAMUSCULAR | Status: DC | PRN
  Administered 2020-07-29: 4000 [IU] via INTRAVENOUS

## 2020-07-29 MED ORDER — CHLORHEXIDINE GLUCONATE 0.12 % MT SOLN: 15.0000 mL | Freq: Once | OROMUCOSAL | Status: AC

## 2020-07-29 MED ORDER — LIDOCAINE-EPINEPHRINE 0.5 %-1:200000 IJ SOLN
INTRAMUSCULAR | Status: DC | PRN
  Administered 2020-07-29: 35 mL

## 2020-07-29 MED ORDER — PHENYLEPHRINE HCL-NACL 10-0.9 MG/250ML-% IV SOLN
INTRAVENOUS | Status: DC | PRN
  Administered 2020-07-29: 40 ug/min via INTRAVENOUS

## 2020-07-29 MED ORDER — ONDANSETRON HCL 4 MG/2ML IJ SOLN: 4.0000 mg | Freq: Once | INTRAMUSCULAR | Status: DC | PRN

## 2020-07-29 MED ORDER — HYDROCODONE-ACETAMINOPHEN 5-325 MG PO TABS: 1.0000 | ORAL_TABLET | Freq: Four times a day (QID) | ORAL | 0 refills | Status: DC | PRN

## 2020-07-29 MED ORDER — LIDOCAINE-EPINEPHRINE 2 %-1:100000 IJ SOLN
INTRAMUSCULAR | Status: AC
  Filled 2020-07-29: qty 1

## 2020-07-29 MED ORDER — FENTANYL CITRATE (PF) 100 MCG/2ML IJ SOLN
INTRAMUSCULAR | Status: AC
  Administered 2020-07-29: 50 ug via INTRAVENOUS
  Filled 2020-07-29: qty 2

## 2020-07-29 MED ORDER — PROPOFOL 10 MG/ML IV BOLUS
INTRAVENOUS | Status: AC
  Filled 2020-07-29: qty 20

## 2020-07-29 MED ORDER — MIDAZOLAM HCL 2 MG/2ML IJ SOLN: 1.0000 mg | Freq: Once | INTRAMUSCULAR | Status: DC

## 2020-07-29 MED ORDER — PROTAMINE SULFATE 10 MG/ML IV SOLN
INTRAVENOUS | Status: DC | PRN
  Administered 2020-07-29: 20 mg via INTRAVENOUS

## 2020-07-29 MED ORDER — LIDOCAINE-EPINEPHRINE 0.5 %-1:200000 IJ SOLN
INTRAMUSCULAR | Status: AC
  Filled 2020-07-29: qty 1

## 2020-07-29 MED ORDER — LIDOCAINE 2% (20 MG/ML) 5 ML SYRINGE
INTRAMUSCULAR | Status: AC
  Filled 2020-07-29: qty 5

## 2020-07-29 MED ORDER — ACETAMINOPHEN 10 MG/ML IV SOLN: 1000.0000 mg | Freq: Once | INTRAVENOUS | Status: DC | PRN

## 2020-07-29 MED ORDER — CEFAZOLIN SODIUM-DEXTROSE 2-4 GM/100ML-% IV SOLN
INTRAVENOUS | Status: AC
  Filled 2020-07-29: qty 100

## 2020-07-29 MED ORDER — DEXAMETHASONE SODIUM PHOSPHATE 10 MG/ML IJ SOLN
INTRAMUSCULAR | Status: DC | PRN
  Administered 2020-07-29: 5 mg via INTRAVENOUS

## 2020-07-29 MED ORDER — ONDANSETRON HCL 4 MG/2ML IJ SOLN
INTRAMUSCULAR | Status: AC
  Filled 2020-07-29: qty 2

## 2020-07-29 MED ORDER — FENTANYL CITRATE (PF) 100 MCG/2ML IJ SOLN
INTRAMUSCULAR | Status: DC | PRN
  Administered 2020-07-29: 25 ug via INTRAVENOUS
  Administered 2020-07-29: 50 ug via INTRAVENOUS

## 2020-07-29 MED ORDER — SODIUM CHLORIDE 0.9 % IV SOLN
INTRAVENOUS | Status: AC
  Filled 2020-07-29: qty 1.2

## 2020-07-29 MED ORDER — SODIUM CHLORIDE 0.9 % IV SOLN: INTRAVENOUS | Status: DC

## 2020-07-29 MED ORDER — CHLORHEXIDINE GLUCONATE 4 % EX LIQD: 60.0000 mL | Freq: Once | CUTANEOUS | Status: DC

## 2020-07-29 MED ORDER — SODIUM CHLORIDE 0.9 % IV SOLN
INTRAVENOUS | Status: DC | PRN
  Administered 2020-07-29: 500 mL

## 2020-07-29 MED ORDER — FENTANYL CITRATE (PF) 100 MCG/2ML IJ SOLN
25.0000 ug | INTRAMUSCULAR | Status: DC | PRN
Start: 2020-07-29 — End: 2020-07-29

## 2020-07-29 MED ORDER — LIDOCAINE-EPINEPHRINE 1 %-1:100000 IJ SOLN
INTRAMUSCULAR | Status: AC
  Filled 2020-07-29: qty 1

## 2020-07-29 MED ORDER — PROPOFOL 10 MG/ML IV BOLUS
INTRAVENOUS | Status: DC | PRN
  Administered 2020-07-29 (×3): 30 mg via INTRAVENOUS

## 2020-07-29 SURGICAL SUPPLY — 33 items
ARMBAND PINK RESTRICT EXTREMIT (MISCELLANEOUS) ×4 IMPLANT
CANISTER SUCT 3000ML PPV (MISCELLANEOUS) ×2 IMPLANT
CANNULA VESSEL 3MM 2 BLNT TIP (CANNULA) ×2 IMPLANT
CLIP VESOCCLUDE MED 6/CT (CLIP) ×2 IMPLANT
CLIP VESOCCLUDE SM WIDE 6/CT (CLIP) ×2 IMPLANT
COVER WAND RF STERILE (DRAPES) ×2 IMPLANT
DECANTER SPIKE VIAL GLASS SM (MISCELLANEOUS) ×2 IMPLANT
DERMABOND ADVANCED (GAUZE/BANDAGES/DRESSINGS) ×1
DERMABOND ADVANCED .7 DNX12 (GAUZE/BANDAGES/DRESSINGS) ×1 IMPLANT
ELECT REM PT RETURN 9FT ADLT (ELECTROSURGICAL) ×2
ELECTRODE REM PT RTRN 9FT ADLT (ELECTROSURGICAL) ×1 IMPLANT
GLOVE BIO SURGEON STRL SZ7.5 (GLOVE) ×2 IMPLANT
GLOVE SRG 8 PF TXTR STRL LF DI (GLOVE) ×1 IMPLANT
GLOVE SURG UNDER POLY LF SZ8 (GLOVE) ×1
GOWN STRL REUS W/ TWL LRG LVL3 (GOWN DISPOSABLE) ×3 IMPLANT
GOWN STRL REUS W/TWL LRG LVL3 (GOWN DISPOSABLE) ×3
GRAFT GORETEX STRT 4-7X45 (Vascular Products) ×2 IMPLANT
KIT BASIN OR (CUSTOM PROCEDURE TRAY) ×2 IMPLANT
KIT TURNOVER KIT B (KITS) ×2 IMPLANT
NS IRRIG 1000ML POUR BTL (IV SOLUTION) ×2 IMPLANT
PACK CV ACCESS (CUSTOM PROCEDURE TRAY) ×2 IMPLANT
PAD ARMBOARD 7.5X6 YLW CONV (MISCELLANEOUS) ×4 IMPLANT
SPONGE LAP 18X18 RF (DISPOSABLE) ×2 IMPLANT
SPONGE SURGIFOAM ABS GEL 100 (HEMOSTASIS) IMPLANT
SUT MNCRL AB 4-0 PS2 18 (SUTURE) ×4 IMPLANT
SUT PROLENE 6 0 BV (SUTURE) ×10 IMPLANT
SUT VIC AB 3-0 SH 27 (SUTURE) ×3
SUT VIC AB 3-0 SH 27X BRD (SUTURE) ×3 IMPLANT
SUT VICRYL 4-0 PS2 18IN ABS (SUTURE) ×4 IMPLANT
SYR TOOMEY 50ML (SYRINGE) IMPLANT
TOWEL GREEN STERILE (TOWEL DISPOSABLE) ×2 IMPLANT
UNDERPAD 30X36 HEAVY ABSORB (UNDERPADS AND DIAPERS) ×2 IMPLANT
WATER STERILE IRR 1000ML POUR (IV SOLUTION) ×2 IMPLANT

## 2020-07-29 NOTE — Discharge Instructions (Signed)
° °  [facility] [place] ° °Discharge Instructions ° °AV Fistula or Graft Surgery for Dialysis Access ° °Please refer to the following instructions for your post-procedure care. Your surgeon or physician assistant will discuss any changes with you. ° °Activity ° °You may drive the day following your surgery, if you are comfortable and no longer taking prescription pain medication. Resume full activity as the soreness in your incision resolves. ° °Bathing/Showering ° °You may shower after you go home. Keep your incision dry for 48 hours. Do not soak in a bathtub, hot tub, or swim until the incision heals completely. You may not shower if you have a hemodialysis catheter. ° °Incision Care ° °Clean your incision with mild soap and water after 48 hours. Pat the area dry with a clean towel. You do not need a bandage unless otherwise instructed. Do not apply any ointments or creams to your incision. You may have skin glue on your incision. Do not peel it off. It will come off on its own in about one week. Your arm may swell a bit after surgery. To reduce swelling use pillows to elevate your arm so it is above your heart. Your doctor will tell you if you need to lightly wrap your arm with an ACE bandage. ° °Diet ° °Resume your normal diet. There are not special food restrictions following this procedure. In order to heal from your surgery, it is CRITICAL to get adequate nutrition. Your body requires vitamins, minerals, and protein. Vegetables are the best source of vitamins and minerals. Vegetables also provide the perfect balance of protein. Processed food has little nutritional value, so try to avoid this. ° °Medications ° °Resume taking all of your medications. If your incision is causing pain, you may take over-the counter pain relievers such as acetaminophen (Tylenol). If you were prescribed a stronger pain medication, please be aware these medications can cause nausea and constipation. Prevent  nausea by taking the medication with a snack or meal. Avoid constipation by drinking plenty of fluids and eating foods with high amount of fiber, such as fruits, vegetables, and grains. Do not take Tylenol if you are taking prescription pain medications. ° ° ° ° °Follow up °Your surgeon may want to see you in the office following your access surgery. If so, this will be arranged at the time of your surgery. ° °Please call us immediately for any of the following conditions: ° °Increased pain, redness, drainage (pus) from your incision site °Fever of 101 degrees or higher °Severe or worsening pain at your incision site °Hand pain or numbness. ° °Reduce your risk of vascular disease: ° °Stop smoking. If you would like help, call [facility] at 1-800-QUIT-NOW (1-800-784-8669) or [place] at 336-586-4000 ° °Manage your cholesterol °Maintain a desired weight °Control your diabetes °Keep your blood pressure down ° °Dialysis ° °It will take several weeks to several months for your new dialysis access to be ready for use. Your surgeon will determine when it is OK to use it. Your nephrologist will continue to direct your dialysis. You can continue to use your Permcath until your new access is ready for use. ° °If you have any questions, please call the office at [phone]. ° °

## 2020-07-29 NOTE — H&P (Signed)
REASON FOR CONSULT:    For redo left upper arm graft.  ASSESSMENT & PLAN:   END-STAGE RENAL DISEASE: This patient presents for a redo left upper arm graft.  The procedure and potential complications have been discussed with the patient and he is agreeable to proceed.  He dialyzes on Tuesdays Thursdays and Saturdays.   [name], MD Office: [phone]   HPI:   Kathleen [name] is a pleasant 60 y.o. female, who I saw in consultation on 04/23/2020 to evaluate for hemodialysis access.  He had a left upper arm graft placed in July 2021.  This clotted despite the fact that the patient had an excellent vein.  It was felt that this likely clotted because of hypotension.  He has been having significant problems with this issue.  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  Thus he would be at high risk for steal on the right.  We were considering placing a thigh graft.  I was reluctant to place a thigh graft until his blood pressure issue was resolved.   When I saw the patient at that time she felt very strongly about not having a thigh graft and therefore I felt we could attempt a redo left upper arm graft before proceeding with a thigh graft.  She told me that her issues with hypotension had resolved.  She dialyzes on Tuesdays Thursdays and Saturdays.  Past Medical History:  Diagnosis Date  . Anemia   . Arthritis   . Cardiac arrest ([place])   . Cellulitis   . Cerebral vasculitis   . COPD (chronic obstructive pulmonary disease) ([place])   . Elevated antinuclear antibody (ANA) level   . Endometriosis   . ESRD (end stage renal disease) on dialysis ([place])   . Gallstones   . Hypertension   . Lupus ([place])   . Migraine   . PONV (postoperative nausea and vomiting)   . Seizures ([place])   . Sepsis ([place])   . Sleep apnea   . Suicide attempt ([place])   . TB  (tuberculosis)   . Transient ischemic attack (TIA)   . Vasculitis of skin     Family History  Problem Relation Age of Onset  . Ovarian cancer Mother   . Breast cancer Sister   . Throat cancer Brother     SOCIAL HISTORY: Social History   Socioeconomic History  . Marital status: Single    Spouse name: Not on file  . Number of children: Not on file  . Years of education: Not on file  . Highest education level: Not on file  Occupational History  . Not on file  Tobacco Use  . Smoking status: Current Some Day Smoker    Packs/day: 0.25    Types: Cigarettes  . Smokeless tobacco: Never Used  Vaping Use  . Vaping Use: Never used  Substance and Sexual Activity  . Alcohol use: No  . Drug use: No  . Sexual activity: Not Currently    Birth control/protection: Post-menopausal  Other Topics Concern  . Not on file  Social History Narrative  . Not on file   Social Determinants of Health   Financial Resource Strain: Not on file  Food Insecurity: Not on file  Transportation Needs: Not on file  Physical Activity: Not on file  Stress: Not on file  Social Connections: Not  on file  Intimate Partner Violence: Not on file    No Known Allergies  Current Facility-Administered Medications  Medication Dose Route Frequency Provider Last Rate Last Admin  . 0.9 %  sodium chloride infusion   Intravenous Continuous Angelia Mould, MD 10 mL/hr at 07/29/20 0948 New Bag at 07/29/20 0948  . ceFAZolin (ANCEF) IVPB 2g/100 mL premix  2 g Intravenous 30 min Pre-Op Angelia Mould, MD      . chlorhexidine (HIBICLENS) 4 % liquid 4 application  60 mL Topical Once Angelia Mould, MD       And  . Derrill Memo ON 07/30/2020] chlorhexidine (HIBICLENS) 4 % liquid 4 application  60 mL Topical Once Angelia Mould, MD      . midazolam (VERSED) injection 1 mg  1 mg Intravenous Once Stoltzfus, March Rummage, DO       Facility-Administered Medications Ordered in Other Encounters  Medication  Dose Route Frequency Provider Last Rate Last Admin  . lidocaine-EPINEPHrine 1.5 %-1:200000 injection   Peri-NEURAL Anesthesia Intra-op Stoltzfus, Gregory P, DO   20 mL at 07/29/20 1005    REVIEW OF SYSTEMS:  [X]  denotes positive finding, [ ]  denotes negative finding Cardiac  Comments:  Chest pain or chest pressure:    Shortness of breath upon exertion:    Short of breath when lying flat:    Irregular heart rhythm:        Vascular    Pain in calf, thigh, or hip brought on by ambulation:    Pain in feet at night that wakes you up from your sleep:     Blood clot in your veins:    Leg swelling:         Pulmonary    Oxygen at home:    Productive cough:     Wheezing:         Neurologic    Sudden weakness in arms or legs:     Sudden numbness in arms or legs:     Sudden onset of difficulty speaking or slurred speech:    Temporary loss of vision in one eye:     Problems with dizziness:         Gastrointestinal    Blood in stool:     Vomited blood:         Genitourinary    Burning when urinating:     Blood in urine:        Psychiatric    Major depression:         Hematologic    Bleeding problems:    Problems with blood clotting too easily:        Skin    Rashes or ulcers:        Constitutional    Fever or chills:     PHYSICAL EXAM:   Vitals:   07/29/20 0835 07/29/20 1010 07/29/20 1015 07/29/20 1020  BP: (!) 171/99 (!) 166/100 (!) 176/97 (!) 169/92  Pulse: (!) 51  66 70  Resp: 18 17 15 14   Temp: 98.1 F (36.7 C)     TempSrc: Oral     SpO2: 98%  100% 100%  Weight: 49.9 kg     Height: 5\' 4"  (1.626 m)       GENERAL: The patient is a well-nourished female, in no acute distress. The vital signs are documented above. CARDIAC: There is a regular rate and rhythm.  VASCULAR: I cannot palpate a right radial pulse.  The patient has a palpable left radial  pulse. PULMONARY: There is good air exchange bilaterally without wheezing or rales. ABDOMEN: Soft and non-tender  with normal pitched bowel sounds.  MUSCULOSKELETAL: There are no major deformities or cyanosis. NEUROLOGIC: No focal weakness or paresthesias are detected. SKIN: There are no ulcers or rashes noted. PSYCHIATRIC: The patient has a normal affect.  DATA:    Her potassium is 5.1.

## 2020-07-29 NOTE — Anesthesia Procedure Notes (Signed)
Procedure Name: MAC Date/Time: 07/29/2020 11:08 AM Performed by: Reece Agar, CRNA Pre-anesthesia Checklist: Patient identified, Emergency Drugs available, Suction available and Patient being monitored Patient Re-evaluated:Patient Re-evaluated prior to induction Oxygen Delivery Method: Simple face mask

## 2020-07-29 NOTE — Progress Notes (Signed)
Dr. Evette Cristal made aware that the pt had coffee and creamer this morning at 0700. Per Dr. Rex Kras, the surgery will be delayed until 23. Pt made aware.

## 2020-07-29 NOTE — Transfer of Care (Signed)
Immediate Anesthesia Transfer of Care Note  Patient: Kathleen [name]  Procedure(s) Performed: INSERTION OF LEFT UPPER ARM ARTERIOVENOUS (AV) GORE-TEX GRAFT (Left Arm Upper)  Patient Location: PACU  Anesthesia Type:MAC and Regional  Level of Consciousness: awake and alert   Airway & Oxygen Therapy: Patient Spontanous Breathing and Patient connected to face mask oxygen  Post-op Assessment: Report given to RN and Post -op Vital signs reviewed and stable  Post vital signs: Reviewed and stable  Last Vitals:  Vitals Value Taken Time  BP 133/78 07/29/20 1304  Temp    Pulse 68 07/29/20 1307  Resp 12 07/29/20 1307  SpO2 100 % 07/29/20 1307  Vitals shown include unvalidated device data.  Last Pain:  Vitals:   07/29/20 1020  TempSrc:   PainSc: 0-No pain      Patients Stated Pain Goal: 3 (31/12/16 2446)  Complications: No complications documented.

## 2020-07-29 NOTE — Op Note (Signed)
    NAME: Kathleen [name]    MRN: [id number] DOB: 1960-10-23    DATE OF OPERATION: 07/29/2020  PREOP DIAGNOSIS:    End-stage renal disease  POSTOP DIAGNOSIS:    Same  PROCEDURE:    Redo left upper arm AV graft (4-7 mm PTFE graft)  SURGEON: Judeth Cornfield. Scot Dock, MD  ASSIST: Arlee Muslim, PA  ANESTHESIA: Axillary block with local  EBL: Minimal  INDICATIONS:    Kathleen [name] is a 60 y.o. female who had a previous left upper arm graft.  This failed presumably because of hypotension.  She presents for new access.  She did not want to have a thigh graft and wanted 1 more attempt at an upper arm graft on the left.  FINDINGS:   The brachial artery at the antecubital level was small therefore placed an upper arm loop graft.  A 4-7 mm PTFE graft was placed.  There was an excellent thrill at the completion of the procedure with good radial and ulnar signal with a Doppler at the completion.  TECHNIQUE:   The patient was taken to the operating room after an axillary block was placed by anesthesia.  The left upper extremity was prepped and draped in usual sterile fashion.  A longitudinal incision was made in the left axilla and here the brachial artery and brachial vein were dissected free.  The vein was lateral to the artery.  Next the previous incision at the antecubital level was opened.  Here the brachial artery was small.  I therefore elected to place an upper arm loop graft.  I did divide the graft which was chronically occluded and oversewed the proximal and with a 6-0 Prolene.  A 4-7 mm PTFE graft was tunneled in a loop fashion in the left upper arm with the arterial aspect of the graft along the medial aspect of the arm and the venous aspect of the graft along the lateral aspect of the arm.  The patient was heparinized.  The high brachial vein was clamped proximally and distally and a longitudinal arteriotomy was made.  A short segment of the 4 mm of the graft was excised, the  graft spatulated and sewn end-to-side to the artery using continuous 6-0 Prolene suture.  The graft was then pulled the appropriate length for anastomosis to the high brachial vein.  The vein was ligated distally and then spatulated.  The graft was cut to the appropriate length spatulated and sewn into into the vein using 2 continuous 6-0 Prolene sutures.  At the completion there was an excellent thrill in the graft and a radial and ulnar signal with a Doppler.  The heparin was partially reversed with protamine.  Each of the wounds was closed with a deep layer of 3-0 Vicryl and the skin closed with 4-0 Vicryl.  Dermabond was applied.  Patient tolerated the procedure well was transferred to the recovery room in stable condition.  All needle and sponge counts were correct.  Given the complexity of the case a first assistant was necessary in order to expedient the procedure and safely perform the technical aspects of the operation.  [name], MD, FACS [facility]  DATE OF DICTATION:   07/29/2020

## 2020-07-29 NOTE — Progress Notes (Signed)
Orthopedic Tech Progress Note Patient Details:  Kathleen [name] 05/29/1960 [id number]  Ortho Devices Type of Ortho Device: Arm sling Ortho Device/Splint Location: LUE Ortho Device/Splint Interventions: Ordered,Application   Post Interventions Patient Tolerated: Well Instructions Provided: Care of device   [name] 07/29/2020, 1:56 PM

## 2020-07-29 NOTE — Anesthesia Procedure Notes (Signed)
Anesthesia Regional Block: Supraclavicular block   Pre-Anesthetic Checklist: ,, timeout performed, Correct Patient, Correct Site, Correct Laterality, Correct Procedure, Correct Position, site marked, Risks and benefits discussed,  Surgical consent,  Pre-op evaluation,  At surgeon's request and post-op pain management  Laterality: Left  Prep: Dura Prep       Needles:  Injection technique: Single-shot  Needle Type: Echogenic Stimulator Needle     Needle Length: 5cm  Needle Gauge: 20     Additional Needles:   Procedures:,,,, ultrasound used (permanent image in chart),,,,  Narrative:  Start time: 07/29/2020 10:05 AM End time: 07/29/2020 10:10 AM Injection made incrementally with aspirations every 5 mL.  Performed by: Personally  Anesthesiologist: Darral Dash, DO  Additional Notes: Patient identified. Risks/Benefits/Options discussed with patient including but not limited to bleeding, infection, nerve damage, failed block, incomplete pain control. Patient expressed understanding and wished to proceed. All questions were answered. Sterile technique was used throughout the entire procedure. Please see nursing notes for vital signs. Aspirated in 5cc intervals with injection for negative confirmation. Patient was given instructions on fall risk and not to get out of bed. All questions and concerns addressed with instructions to call with any issues or inadequate analgesia.

## 2020-07-30 ENCOUNTER — Encounter (HOSPITAL_COMMUNITY): Payer: Self-pay | Admitting: Vascular Surgery

## 2020-07-30 NOTE — Anesthesia Postprocedure Evaluation (Signed)
Anesthesia Post Note  Patient: Kathleen [name]  Procedure(s) Performed: INSERTION OF LEFT UPPER ARM ARTERIOVENOUS (AV) GORE-TEX GRAFT (Left Arm Upper)     Patient location during evaluation: PACU Anesthesia Type: Regional and MAC Level of consciousness: awake and alert Pain management: pain level controlled Vital Signs Assessment: post-procedure vital signs reviewed and stable Respiratory status: spontaneous breathing, nonlabored ventilation, respiratory function stable and patient connected to nasal cannula oxygen Cardiovascular status: stable and blood pressure returned to baseline Postop Assessment: no apparent nausea or vomiting Anesthetic complications: no   No complications documented.  Last Vitals:  Vitals:   07/29/20 1320 07/29/20 1335  BP: (!) 148/82 (!) 152/86  Pulse: 68 67  Resp: 13 15  Temp:  36.4 C  SpO2: 100% 100%    Last Pain:  Vitals:   07/29/20 1335  TempSrc:   PainSc: 0-No pain                 Belenda Cruise P Hurshell Dino

## 2020-09-18 ENCOUNTER — Other Ambulatory Visit: Payer: Self-pay

## 2020-09-18 DIAGNOSIS — N186 End stage renal disease: Secondary | ICD-10-CM

## 2020-10-13 ENCOUNTER — Other Ambulatory Visit: Payer: Self-pay

## 2020-10-13 ENCOUNTER — Ambulatory Visit (HOSPITAL_COMMUNITY)
Admission: RE | Admit: 2020-10-13 | Discharge: 2020-10-13 | Disposition: A | Discharge status: Home / Self Care | Payer: Medicare HMO | Source: Ambulatory Visit | Attending: Surgery | Admitting: Surgery

## 2020-10-13 ENCOUNTER — Ambulatory Visit (INDEPENDENT_AMBULATORY_CARE_PROVIDER_SITE_OTHER): Payer: Medicare HMO | Admitting: Physician Assistant

## 2020-10-13 VITALS — BP 104/69 | HR 90 | Temp 97.7°F | Resp 14 | Ht 64.0 in | Wt 111.0 lb

## 2020-10-13 DIAGNOSIS — N186 End stage renal disease: Secondary | ICD-10-CM | POA: Insufficient documentation

## 2020-10-13 NOTE — Progress Notes (Signed)
    Postoperative Access Visit   History of Present Illness   Kathleen [name] is a 60 y.o. year old female who presents for postoperative follow-up for: left upper arm loop graft by Dr. Scot Dock (Date: 07/29/20).  She previously had a left brachiocephalic fistula on 5/46/2703 and subsequently a left upper arm graft on 11/27/2019.  Patient feels a pain in her left forearm after being on dialysis.  She states that the graft is functioning well and she is completing her treatments each time.  She however has not yet had her right IJ TDC removed.  Her nephrologist Dr. Justin Mend wanted her graft to be evaluated 1 more time prior to removing TDC.  She is dialyzing on a Tuesday Thursday Saturday schedule at the [facility] location.  Physical Examination   Vitals:   10/13/20 1337  BP: 104/69  Pulse: 90  Resp: 14  Temp: 97.7 F (36.5 C)  TempSrc: Temporal  SpO2: 100%  Weight: 111 lb (50.3 kg)  Height: 5\' 4"  (1.626 m)   Body mass index is 19.05 kg/m.  left arm Incisions are healed, palpable radial pulse, hand grip is 5/5, sensation in digits is intact, palpable thrill, bruit can be auscultated     Medical Decision Making   Kathleen [name] is a 60 y.o. year old female who presents s/p left upper arm arteriovenous graft   Patent L upper arm loop graft without signs or symptoms of steal syndrome  Based on physical exam as well as duplex, there are no areas of hemodynamically significant stenosis throughout the graft.  The etiology of left forearm pain is unclear however could be related to neuropathic pain given the positioning that is required to access the medial portion of the loop graft.  Patient states this discomfort is tolerable.  She would prefer to continue dialysis through recently placed left arm graft instead of attempting dialysis access placement in right arm or lower extremity  Okay to remove [facility] when nephrology is comfortable  The patient may follow up on a prn  basis   Kathleen Ligas PA-C [facility] [place] Office: [phone]  Clinic MD: Stanford Breed on call

## 2021-03-12 ENCOUNTER — Other Ambulatory Visit: Payer: Self-pay

## 2021-03-12 ENCOUNTER — Emergency Department (HOSPITAL_COMMUNITY): Payer: Medicare Other

## 2021-03-12 ENCOUNTER — Encounter (HOSPITAL_COMMUNITY): Payer: Self-pay

## 2021-03-12 ENCOUNTER — Inpatient Hospital Stay (HOSPITAL_COMMUNITY)
Admission: EM | Admit: 2021-03-12 | Discharge: 2021-03-15 | DRG: 640 | Disposition: A | Discharge status: Home / Self Care | Payer: Medicare Other | Attending: Internal Medicine | Admitting: Internal Medicine

## 2021-03-12 DIAGNOSIS — Z8041 Family history of malignant neoplasm of ovary: Secondary | ICD-10-CM | POA: Diagnosis not present

## 2021-03-12 DIAGNOSIS — N186 End stage renal disease: Secondary | ICD-10-CM | POA: Diagnosis present

## 2021-03-12 DIAGNOSIS — J9601 Acute respiratory failure with hypoxia: Secondary | ICD-10-CM | POA: Diagnosis not present

## 2021-03-12 DIAGNOSIS — Z803 Family history of malignant neoplasm of breast: Secondary | ICD-10-CM

## 2021-03-12 DIAGNOSIS — D631 Anemia in chronic kidney disease: Secondary | ICD-10-CM | POA: Diagnosis present

## 2021-03-12 DIAGNOSIS — Z20822 Contact with and (suspected) exposure to covid-19: Secondary | ICD-10-CM | POA: Diagnosis present

## 2021-03-12 DIAGNOSIS — I132 Hypertensive heart and chronic kidney disease with heart failure and with stage 5 chronic kidney disease, or end stage renal disease: Secondary | ICD-10-CM | POA: Diagnosis not present

## 2021-03-12 DIAGNOSIS — Z720 Tobacco use: Secondary | ICD-10-CM | POA: Diagnosis present

## 2021-03-12 DIAGNOSIS — M3214 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: Secondary | ICD-10-CM | POA: Diagnosis present

## 2021-03-12 DIAGNOSIS — Z808 Family history of malignant neoplasm of other organs or systems: Secondary | ICD-10-CM | POA: Diagnosis not present

## 2021-03-12 DIAGNOSIS — I739 Peripheral vascular disease, unspecified: Secondary | ICD-10-CM | POA: Diagnosis not present

## 2021-03-12 DIAGNOSIS — E875 Hyperkalemia: Principal | ICD-10-CM | POA: Diagnosis present

## 2021-03-12 DIAGNOSIS — Z8673 Personal history of transient ischemic attack (TIA), and cerebral infarction without residual deficits: Secondary | ICD-10-CM | POA: Diagnosis not present

## 2021-03-12 DIAGNOSIS — I160 Hypertensive urgency: Secondary | ICD-10-CM | POA: Diagnosis present

## 2021-03-12 DIAGNOSIS — J449 Chronic obstructive pulmonary disease, unspecified: Secondary | ICD-10-CM | POA: Diagnosis not present

## 2021-03-12 DIAGNOSIS — Z9151 Personal history of suicidal behavior: Secondary | ICD-10-CM

## 2021-03-12 DIAGNOSIS — G43909 Migraine, unspecified, not intractable, without status migrainosus: Secondary | ICD-10-CM | POA: Diagnosis not present

## 2021-03-12 DIAGNOSIS — Z79899 Other long term (current) drug therapy: Secondary | ICD-10-CM | POA: Diagnosis not present

## 2021-03-12 DIAGNOSIS — I5033 Acute on chronic diastolic (congestive) heart failure: Secondary | ICD-10-CM | POA: Diagnosis not present

## 2021-03-12 DIAGNOSIS — F17210 Nicotine dependence, cigarettes, uncomplicated: Secondary | ICD-10-CM | POA: Diagnosis present

## 2021-03-12 DIAGNOSIS — M898X9 Other specified disorders of bone, unspecified site: Secondary | ICD-10-CM | POA: Diagnosis present

## 2021-03-12 DIAGNOSIS — Z09 Encounter for follow-up examination after completed treatment for conditions other than malignant neoplasm: Secondary | ICD-10-CM

## 2021-03-12 DIAGNOSIS — G40909 Epilepsy, unspecified, not intractable, without status epilepticus: Secondary | ICD-10-CM | POA: Diagnosis present

## 2021-03-12 DIAGNOSIS — R9431 Abnormal electrocardiogram [ECG] [EKG]: Secondary | ICD-10-CM | POA: Diagnosis present

## 2021-03-12 DIAGNOSIS — Z992 Dependence on renal dialysis: Secondary | ICD-10-CM

## 2021-03-12 DIAGNOSIS — J441 Chronic obstructive pulmonary disease with (acute) exacerbation: Secondary | ICD-10-CM | POA: Diagnosis present

## 2021-03-12 DIAGNOSIS — Z8674 Personal history of sudden cardiac arrest: Secondary | ICD-10-CM

## 2021-03-12 DIAGNOSIS — R0602 Shortness of breath: Secondary | ICD-10-CM | POA: Diagnosis present

## 2021-03-12 DIAGNOSIS — G4733 Obstructive sleep apnea (adult) (pediatric): Secondary | ICD-10-CM | POA: Diagnosis not present

## 2021-03-12 DIAGNOSIS — M1990 Unspecified osteoarthritis, unspecified site: Secondary | ICD-10-CM | POA: Diagnosis present

## 2021-03-12 DIAGNOSIS — I10 Essential (primary) hypertension: Secondary | ICD-10-CM | POA: Diagnosis present

## 2021-03-12 DIAGNOSIS — N2581 Secondary hyperparathyroidism of renal origin: Secondary | ICD-10-CM | POA: Diagnosis not present

## 2021-03-12 DIAGNOSIS — I5020 Unspecified systolic (congestive) heart failure: Secondary | ICD-10-CM

## 2021-03-12 LAB — COMPREHENSIVE METABOLIC PANEL
ALT: 15 U/L (ref 0–44)
AST: 29 U/L (ref 15–41)
Albumin: 3.6 g/dL (ref 3.5–5.0)
Alkaline Phosphatase: 80 U/L (ref 38–126)
Anion gap: 12 (ref 5–15)
BUN: 68 mg/dL — ABNORMAL HIGH (ref 6–20)
CO2: 23 mmol/L (ref 22–32)
Calcium: 8.4 mg/dL — ABNORMAL LOW (ref 8.9–10.3)
Chloride: 102 mmol/L (ref 98–111)
Creatinine, Ser: 14.66 mg/dL — ABNORMAL HIGH (ref 0.44–1.00)
GFR, Estimated: 3 mL/min — ABNORMAL LOW (ref >60)
Glucose, Bld: 89 mg/dL (ref 70–99)
Potassium: 6.2 mmol/L — ABNORMAL HIGH (ref 3.5–5.1)
Sodium: 137 mmol/L (ref 135–145)
Total Bilirubin: 0.8 mg/dL (ref 0.3–1.2)
Total Protein: 7.5 g/dL (ref 6.5–8.1)

## 2021-03-12 LAB — I-STAT CHEM 8, ED
BUN: 64 mg/dL — ABNORMAL HIGH (ref 6–20)
Calcium, Ion: 1.03 mmol/L — ABNORMAL LOW (ref 1.15–1.40)
Chloride: 105 mmol/L (ref 98–111)
Creatinine, Ser: 17.5 mg/dL — ABNORMAL HIGH (ref 0.44–1.00)
Glucose, Bld: 84 mg/dL (ref 70–99)
HCT: 29 % — ABNORMAL LOW (ref 36.0–46.0)
Hemoglobin: 9.9 g/dL — ABNORMAL LOW (ref 12.0–15.0)
Potassium: 6.3 mmol/L (ref 3.5–5.1)
Sodium: 139 mmol/L (ref 135–145)
TCO2: 24 mmol/L (ref 22–32)

## 2021-03-12 LAB — HEPATITIS B SURFACE ANTIGEN: Hepatitis B Surface Ag: NONREACTIVE

## 2021-03-12 LAB — HEPATITIS B SURFACE ANTIBODY,QUALITATIVE: Hep B S Ab: REACTIVE — AB

## 2021-03-12 LAB — CBC WITH DIFFERENTIAL/PLATELET
Abs Immature Granulocytes: 0.02 10*3/uL (ref 0.00–0.07)
Basophils Absolute: 0.1 10*3/uL (ref 0.0–0.1)
Basophils Relative: 1 %
Eosinophils Absolute: 0.1 10*3/uL (ref 0.0–0.5)
Eosinophils Relative: 2 %
HCT: 28.4 % — ABNORMAL LOW (ref 36.0–46.0)
Hemoglobin: 8.8 g/dL — ABNORMAL LOW (ref 12.0–15.0)
Immature Granulocytes: 0 %
Lymphocytes Relative: 31 %
Lymphs Abs: 2 10*3/uL (ref 0.7–4.0)
MCH: 30.1 pg (ref 26.0–34.0)
MCHC: 31 g/dL (ref 30.0–36.0)
MCV: 97.3 fL (ref 80.0–100.0)
Monocytes Absolute: 0.7 10*3/uL (ref 0.1–1.0)
Monocytes Relative: 11 %
Neutro Abs: 3.5 10*3/uL (ref 1.7–7.7)
Neutrophils Relative %: 55 %
Platelets: 218 10*3/uL (ref 150–400)
RBC: 2.92 MIL/uL — ABNORMAL LOW (ref 3.87–5.11)
RDW: 15.9 % — ABNORMAL HIGH (ref 11.5–15.5)
WBC: 6.3 10*3/uL (ref 4.0–10.5)
nRBC: 0 % (ref 0.0–0.2)

## 2021-03-12 LAB — BRAIN NATRIURETIC PEPTIDE: B Natriuretic Peptide: 2335.2 pg/mL — ABNORMAL HIGH (ref 0.0–100.0)

## 2021-03-12 LAB — TROPONIN I (HIGH SENSITIVITY): Troponin I (High Sensitivity): 45 ng/L — ABNORMAL HIGH (ref <18)

## 2021-03-12 LAB — RESP PANEL BY RT-PCR (FLU A&B, COVID) ARPGX2
Influenza A by PCR: NEGATIVE
Influenza B by PCR: NEGATIVE
SARS Coronavirus 2 by RT PCR: NEGATIVE

## 2021-03-12 MED ORDER — HEPARIN SODIUM (PORCINE) 1000 UNIT/ML DIALYSIS: 3000.0000 [IU] | Freq: Once | INTRAMUSCULAR | Status: DC

## 2021-03-12 MED ORDER — ACETAMINOPHEN 650 MG RE SUPP: 650.0000 mg | Freq: Four times a day (QID) | RECTAL | Status: DC | PRN

## 2021-03-12 MED ORDER — HEPARIN SODIUM (PORCINE) 5000 UNIT/ML IJ SOLN
5000.0000 [IU] | Freq: Three times a day (TID) | INTRAMUSCULAR | Status: DC
  Administered 2021-03-12 – 2021-03-15 (×9): 5000 [IU] via SUBCUTANEOUS
  Filled 2021-03-12 (×9): qty 1

## 2021-03-12 MED ORDER — ONDANSETRON HCL 4 MG/2ML IJ SOLN: 4.0000 mg | Freq: Four times a day (QID) | INTRAMUSCULAR | Status: DC | PRN

## 2021-03-12 MED ORDER — ACETAMINOPHEN 325 MG PO TABS: 650.0000 mg | ORAL_TABLET | Freq: Four times a day (QID) | ORAL | Status: DC | PRN

## 2021-03-12 MED ORDER — SEVELAMER CARBONATE 800 MG PO TABS
1600.0000 mg | ORAL_TABLET | Freq: Three times a day (TID) | ORAL | Status: DC
  Administered 2021-03-12 – 2021-03-15 (×7): 1600 mg via ORAL
  Filled 2021-03-12 (×8): qty 2

## 2021-03-12 MED ORDER — CHLORHEXIDINE GLUCONATE CLOTH 2 % EX PADS
6.0000 | MEDICATED_PAD | Freq: Every day | CUTANEOUS | Status: DC
  Administered 2021-03-13 – 2021-03-15 (×3): 6 via TOPICAL

## 2021-03-12 MED ORDER — CINACALCET HCL 30 MG PO TABS
60.0000 mg | ORAL_TABLET | ORAL | Status: DC
  Administered 2021-03-14: 60 mg via ORAL
  Filled 2021-03-12: qty 2

## 2021-03-12 MED ORDER — AMLODIPINE BESYLATE 5 MG PO TABS
5.0000 mg | ORAL_TABLET | Freq: Every day | ORAL | Status: DC
  Administered 2021-03-12 – 2021-03-14 (×3): 5 mg via ORAL
  Filled 2021-03-12 (×3): qty 1

## 2021-03-12 MED ORDER — SODIUM ZIRCONIUM CYCLOSILICATE 10 G PO PACK
10.0000 g | PACK | Freq: Once | ORAL | Status: AC
  Administered 2021-03-12: 10 g via ORAL
  Filled 2021-03-12: qty 1

## 2021-03-12 MED ORDER — IPRATROPIUM-ALBUTEROL 0.5-2.5 (3) MG/3ML IN SOLN
3.0000 mL | RESPIRATORY_TRACT | Status: DC | PRN
  Administered 2021-03-12: 3 mL via RESPIRATORY_TRACT
  Filled 2021-03-12: qty 3

## 2021-03-12 MED ORDER — PROCHLORPERAZINE EDISYLATE 10 MG/2ML IJ SOLN
5.0000 mg | Freq: Four times a day (QID) | INTRAMUSCULAR | Status: DC | PRN
  Filled 2021-03-12: qty 1

## 2021-03-12 MED ORDER — ONDANSETRON HCL 4 MG PO TABS: 4.0000 mg | ORAL_TABLET | Freq: Four times a day (QID) | ORAL | Status: DC | PRN

## 2021-03-12 MED ORDER — NITROGLYCERIN 2 % TD OINT
1.0000 [in_us] | TOPICAL_OINTMENT | Freq: Four times a day (QID) | TRANSDERMAL | Status: AC
  Filled 2021-03-12: qty 30

## 2021-03-12 NOTE — Significant Event (Signed)
Rapid Response Event Note   Reason for Call :  Patient in acute respiratory distress  Initial Focused Assessment:  Patient is tripoding, significant accessory muscle use.  She is also compressing the bag on the NRB. Heart tones are regular Lung sounds decreased bases, Crackles up to nipple line  BP 200/107  HR 85  RR 26 O2 sat 100%    Interventions:  Duoneb Placed patient on BiPap 10/5 30% Patient immediately began to breath more comfortably She is able to answer questions and interacts appropriately with staff. 150/97  HR 82  RR 20 O2 sat 100%  Dr Antonieta Pert and Dr Jonnie Finner at bedside to assess patient  Plan of Care:  Continue Bipap during HD treatment Wean patient when able Upgrade level of care to progressive   Event Summary:   MD Notified: Dr Olevia Bowens and Dr Jonnie Finner Call Time: [place] Time: 0100 End Time: [place]  Raliegh Ip, RN

## 2021-03-12 NOTE — Plan of Care (Signed)

## 2021-03-12 NOTE — Progress Notes (Signed)
RT called to access patient due to respiratory distress.  RT found patient in tripod position with accessory muscle, stating she can't breathe on 15L NRB mask.  RT asked bedside HD RN to call Rapid Response (RR) RN.  RT gave a PRN Duoneb treatment without much improvement.  RR-RN at bedside, decision made to place patient on Bipap immediately.  Upon Bipap starting patient looks like she is breathing comfortably, no WOB, and states her breathing feels better.  MD called.

## 2021-03-12 NOTE — ED Triage Notes (Signed)
Pt presents with c/o shortness of breath that started last night. Pt reports she was on her way to dialysis this morning and came to the ER because of the shortness of breath.

## 2021-03-12 NOTE — Procedures (Signed)
   I was present at this dialysis session, have reviewed the session itself and made  appropriate changes [name] MD [place] pager [phone]   03/12/2021, 2:19 PM

## 2021-03-12 NOTE — Progress Notes (Signed)
Pt states she does not want to wear CPAP tonight. She is currently sat 100% on 4L [place]. RT will cont to monitor.

## 2021-03-12 NOTE — H&P (Signed)
History and Physical    Kathleen [name] DJM:[id number] DOB: Aug 07, 1960 DOA: 03/12/2021  PCP: Angela Burke, NP   Patient coming from: Home.  I have personally briefly reviewed patient's old medical records in [place]  Chief Complaint: Shortness of breath.  HPI: Kathleen [name] is a 60 y.o. 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  She has been coughing up whitish sputum.  She denied fever, chills, rhinorrhea, sore throat, wheezing or hemoptysis.  No chest pain, palpitations, diaphoresis, PND, orthopnea or reason pitting edema lower extremities.  Denied abdominal pain, nausea, emesis, diarrhea, constipation, melena or hematochezia.  She is not compliant with HD schedule or medications.  She does not remember exactly when her last HD was done.  She stated she ate a piece of ham yesterday that was very salty.  ED Course: Initial vital signs were temperature 97.8 F, pulse 85, respirations 20, BP 172/96 mmHg O2 sat 94% on room air.  However, when I arrived to see the patient she was dyspneic with her O2 sats in the high 70s and low 80s on nasal cannula at 2 LPM.  She has some improvement after increasing the oxygen flow to 6 LPM but given her symptoms she was placed on a nonrebreather mask.  She was given Lokelma 10 g p.o. x1 and I I added Nitropaste.  Arrangements were made with [facility], Dr. Jonnie Finner and Dr. Maren Beach to take the patient to the hemodialysis suite [facility] soon as possible.  Lab work: CBC had a white count 6.3, hemoglobin 8.8 g/dL platelets  218.  Troponin was 45 ng/L and BNP 2335.2 pg/mL.  CMP showed a potassium of 6.2 mmol/L.  BUN was 68, creatinine 14.66 and calcium 8.4 mg/dL.  Imaging: 2 view chest radiograph showed mild cardiomegaly, central vascular congestion with patchy interstitial airspace opacities in the right greater than the left.  Multifocal pneumonia versus asymmetric edema are in the differential diagnosis.  Please see images and full radiology report for further detail.  Review of Systems: As per HPI otherwise all other systems reviewed and are negative.  Past Medical History:  Diagnosis Date   Anemia    Arthritis    Cardiac arrest ([place])    Cellulitis    Cerebral vasculitis    COPD (chronic obstructive pulmonary disease) (HCC)    Elevated antinuclear antibody (ANA) level    Endometriosis    ESRD (end stage renal disease) on dialysis (HCC)    Gallstones    Hypertension    Lupus (HCC)    Migraine    PONV (postoperative nausea and vomiting)    Seizures ([place])    Sepsis ([place])    Sleep apnea    Suicide attempt ([place])    TB (tuberculosis)    Transient ischemic attack (TIA)    Vasculitis of skin     Past Surgical History:  Procedure Laterality Date   AV FISTULA PLACEMENT Left 07/04/2017   Procedure: ARTERIOVENOUS (AV) FISTULA CREATION;  Surgeon: Conrad Olathe, MD;  Location: [place];  Service: Vascular;  Laterality: Left;   AV FISTULA PLACEMENT Left 11/27/2019   Procedure: INSERTION OF ARTERIOVENOUS (AV) GORE-TEX GRAFT  LEFT UPPER ARM;  Surgeon: Angelia Mould, MD;  Location: [place];  Service: Vascular;  Laterality: Left;   AV FISTULA PLACEMENT Left 07/29/2020   Procedure: INSERTION OF LEFT UPPER ARM ARTERIOVENOUS (AV) GORE-TEX GRAFT;  Surgeon: Angelia Mould, MD;  Location: [place];  Service: Vascular;  Laterality: Left;   CESAREAN SECTION     ESOPHAGOGASTRODUODENOSCOPY N/A 07/15/2016   Procedure: ESOPHAGOGASTRODUODENOSCOPY (EGD);  Surgeon: Laurence Spates, MD;  Location: [facility] ENDOSCOPY;  Service:  Endoscopy;  Laterality: N/A;   INSERTION OF DIALYSIS CATHETER Right 07/04/2017   Procedure: INSERTION OF TUNNELED  DIALYSIS CATHETER - RIGHT INTERNAL JUGULAR PLACEMENT;  Surgeon: Conrad McLean, MD;  Location: [place];  Service: Vascular;  Laterality: Right;   LUNG BIOPSY     TUBAL LIGATION      Social History  reports that she has been smoking cigarettes. She has been smoking an average of .25 packs per day. She has never used smokeless tobacco. She reports that she does not drink alcohol and does not use drugs.  No Known Allergies  Family History  Problem Relation Age of Onset   Ovarian cancer Mother    Breast cancer Sister    Throat cancer Brother    Prior to Admission medications   Medication Sig Start Date End Date Taking? Authorizing Provider  amLODipine (NORVASC) 5 MG tablet Take 1 tablet (5 mg total) by mouth at bedtime. Patient taking differently: Take 5 mg by mouth at bedtime as needed (high blood pressue). 05/31/18   Oswald Hillock, MD  HYDROcodone-acetaminophen (NORCO/VICODIN) 5-325 MG tablet Take 1 tablet by mouth every 6 (six) hours as needed for moderate pain. Patient not taking: Reported on 10/13/2020 07/29/20 07/29/21  Dagoberto Ligas, PA-C  hydroxychloroquine (PLAQUENIL) 200 MG tablet Take 200 mg by mouth daily.  06/02/18   [provider]  sevelamer carbonate (RENVELA) 800 MG tablet Take 1,600 mg by mouth 2 (two) times daily with a meal. 09/21/18   [provider]    Physical Exam: Vitals:   03/12/21 0915 03/12/21 0930 03/12/21 0945 03/12/21 1000  BP: (!) 175/113 (!) 175/99  (!) 181/103  Pulse:    85  Resp: (!) 21 (!) 22 20 20   Temp:      TempSrc:      SpO2:    95%    Constitutional: Chronically ill-appearing.  NAD, calm, comfortable Eyes: PERRL, lids and conjunctivae normal ENMT: Mucous membranes are moist. Posterior pharynx clear of any exudate or lesions. Neck: normal, supple, no masses, no thyromegaly Respiratory: Dyspneic and tachypneic in the  mid 20s with decreased air movement in the bases subtle bilateral rhonchi and bibasilar rales. No accessory muscle use.  Cardiovascular: Regular rate and rhythm, no murmurs / rubs / gallops. No extremity edema. 2+ pedal pulses. No carotid bruits.  LUE AV graft with good thrill. Abdomen: No distention.  Bowel sounds positive.  Soft, no tenderness, no masses palpated. No hepatosplenomegaly.  Musculoskeletal: Mild generalized weakness.  No clubbing / cyanosis. Good ROM, no contractures. Normal muscle tone.  Skin: No rashes, lesions, ulcers on limited dermatological examination. Neurologic: CN 2-12 grossly intact. Sensation intact, DTR normal. Strength 5/5 in all 4.  Psychiatric: Normal judgment and insight. Alert and oriented x 3. Normal mood.   Labs on Admission: I have personally reviewed following labs and imaging studies  CBC: Recent Labs  Lab 03/12/21 0950 03/12/21 0958  WBC 6.3  --   NEUTROABS 3.5  --   HGB 8.8* 9.9*  HCT  28.4* 29.0*  MCV 97.3  --   PLT 218  --    Basic Metabolic Panel: Recent Labs  Lab 03/12/21 0958  NA 139  K 6.3*  CL 105  GLUCOSE 84  BUN 64*  CREATININE 17.50*   GFR: CrCl cannot be calculated (Unknown ideal weight.).  Liver Function Tests: No results for input(s): AST, ALT, ALKPHOS, BILITOT, PROT, ALBUMIN in the last 168 hours.  Radiological Exams on Admission: DG Chest 2 View  Result Date: 03/12/2021 CLINICAL DATA:  shortness of breath EXAM: CHEST - 2 VIEW COMPARISON:  March 02, 2021. FINDINGS: Patchy interstitial airspace opacities in the right greater than left lungs. Interstitial opacities are slightly less conspicuous; however, right basilar airspace opacities are more conspicuous. Enlarged cardiac silhouette. Central pulmonary vascular congestion. IMPRESSION: 1. Patchy interstitial airspace opacities in the right greater than left lungs. Interstitial opacities are slightly less conspicuous; however, right basilar airspace opacities are more  conspicuous. Findings could represent multifocal pneumonia or asymmetric edema. 2. Mild cardiomegaly and central pulmonary vascular congestion. Electronically Signed   By: Margaretha Sheffield M.D.   On: 03/12/2021 08:58    10/10/2017 echocardiogram -------------------------------------------------------------------  LV EF: 55% -   60%   -------------------------------------------------------------------  Indications:      Syncope 780.2.   -------------------------------------------------------------------  History:   PMH:  End Stage Renal Disease. Anemia. Lupus.  Transient  ischemic attack.  Chronic obstructive pulmonary disease.  Risk  factors:  Former Tobacco Use. Hypertension.   -------------------------------------------------------------------  Study Conclusions   - Left ventricle: The cavity size was normal. Wall thickness was    increased in a pattern of mild LVH. Systolic function was normal.    The estimated ejection fraction was in the range of 55% to 60%.    Wall motion was normal; there were no regional wall motion    abnormalities. Doppler parameters are consistent with abnormal    left ventricular relaxation (grade 1 diastolic dysfunction).  - Aortic valve: Trileaflet; mildly thickened, mildly calcified    leaflets. There was mild regurgitation.  - Aorta: Ascending aortic diameter: 42 mm (S).  - Ascending aorta: The ascending aorta was mildly dilated.  - Mitral valve: Calcified annulus.    EKG: Independently reviewed.  Vent. rate 84 BPM PR interval 156 ms QRS duration 95 ms QT/QTcB 432/511 ms P-R-T axes 61 4 88 Sinus rhythm Anterior infarct, old Prolonged QT interval  Assessment/Plan Principal Problem:   Acute respiratory failure with hypoxia (HCC) In the setting of    Volume overload  Due to missed HD treatments and sodium intake. Resulting in   Acute on chronic diastolic CHF Observation/telemetry. Continue supplemental oxygen. No ACE inhibitor with  hyperkalemia. No beta-blocker due to acute decompensation. Transfer to [facility] for HD. Monitor daily weights, intake and output. Check echocardiogram.  Active Problems:   Hyperkalemia Received Lokelma. Scheduled for dialysis this p.m.   Follow-up potassium level.    ESRD (end stage renal disease) ([place]) Cinacalcet and sevelamer carbonate Dialysis orders per Dr. Burnett Sheng.   Hypertension Resume amlodipine 10 mg p.o. daily.    COPD (chronic obstructive pulmonary disease) (HCC) Continue supplemental oxygen. Bronchodilators as needed.    Anemia in ESRD (end-stage renal disease) (HCC) Monitor hematocrit and hemoglobin. Erythropoietin as needed per nephrology.    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 No gross signs of active disease. Follow-up with rheumatology as an outpatient.    PAD (peripheral artery disease) (HCC) Smoking cessation advised. Check fasting lipid panel with PCP.  Follow-up with vascular surgery periodically.    Tobacco use Smoking cessation advised. Nicotine replacement therapy ordered.    DVT prophylaxis: Heparin SQ. Code Status:   Full code. Family Communication:   Disposition Plan:   Patient is from:  Home.  Anticipated DC to:  Home.  Anticipated DC date:  03/13/2021.  Anticipated DC barriers: Clinical status.  Consults called:  Dr. Roney Jaffe (nephrology). Admission status:  Observation/telemetry.   Severity of Illness: High severity after presenting with dyspnea secondary to volume overload and found to be hyperkalemic.  The patient will be dialyzed early this afternoon.  Reubin Milan MD [facility]  How to contact the [facility] Attending or Consulting provider [place] or covering provider during after hours [place], for this patient?   Check the care team in [facility] and look for a) attending/consulting [facility] provider listed and b) the [facility] team listed Log into [email or web address] and use [facility]'s universal password to access. If you do not have the password, please contact the hospital operator. Locate the [facility] provider you are looking for under [facility] and page to a number that you can be directly reached. If you still have difficulty reaching the provider, please page the [facility] (Director on Call) for the Hospitalists listed on amion for assistance.  03/12/2021, 10:37 AM   This document was prepared using Dragon voice recognition software may contain some unintended transcription errors.

## 2021-03-12 NOTE — Consult Note (Signed)
Renal Service Consult Note [facility]  Kathleen [name] 03/12/2021 [name], MD Requesting Physician: Dr. Olevia Bowens  Reason for Consult: ESRD pt w/ resp distress HPI: The patient is a 60 y.o. year-old w/ hx of anemia, COPD, ESRD on HD, SLE, HTN, seizures, OSA, hx TB, TIA presented to ED today 11/3 w/ SOB starting last night. Came to ED this am for SOB. She did not have dialysis on Tuesday. In ED BP was 180/105, HR 92 and RR 20-26, afebrile. CXR showed patchy bilat infiltrates c/w edema vs infection. Asked to see for ESRD.    Pt seen in HD. She arrived in distress on NRB mask, rapid response called and she required placement of bipap support.  She is on dialysis and feeling "much better" on bipap support. Pt cannot give much other history at this time.   No chest pain.     ROS - n/a   Past Medical History  Past Medical History:  Diagnosis Date   Anemia    Arthritis    Cardiac arrest ([place])    Cellulitis    Cerebral vasculitis    COPD (chronic obstructive pulmonary disease) (HCC)    Elevated antinuclear antibody (ANA) level    Endometriosis    ESRD (end stage renal disease) on dialysis (HCC)    Gallstones    Hypertension    Lupus (HCC)    Migraine    PONV (postoperative nausea and vomiting)    Seizures ([place])    Sepsis ([place])    Sleep apnea    Suicide attempt ([place])    TB (tuberculosis)    Transient ischemic attack (TIA)    Vasculitis of skin    Past Surgical History  Past Surgical History:  Procedure Laterality Date   AV FISTULA PLACEMENT Left 07/04/2017   Procedure: ARTERIOVENOUS (AV) FISTULA CREATION;  Surgeon: Conrad Rosman, MD;  Location: [place];  Service: Vascular;  Laterality: Left;   AV FISTULA PLACEMENT Left 11/27/2019   Procedure: INSERTION OF ARTERIOVENOUS (AV) GORE-TEX GRAFT LEFT UPPER ARM;  Surgeon: Angelia Mould, MD;  Location: [place];  Service: Vascular;  Laterality: Left;   AV FISTULA PLACEMENT Left 07/29/2020   Procedure:  INSERTION OF LEFT UPPER ARM ARTERIOVENOUS (AV) GORE-TEX GRAFT;  Surgeon: Angelia Mould, MD;  Location: [facility] OR;  Service: Vascular;  Laterality: Left;   CESAREAN SECTION     ESOPHAGOGASTRODUODENOSCOPY N/A 07/15/2016   Procedure: ESOPHAGOGASTRODUODENOSCOPY (EGD);  Surgeon: Laurence Spates, MD;  [facility] ENDOSCOPY;  Service: Endoscopy;  Laterality: N/A;   INSERTION OF DIALYSIS CATHETER Right 07/04/2017   Procedure: INSERTION OF TUNNELED  DIALYSIS CATHETER - RIGHT INTERNAL JUGULAR PLACEMENT;  Surgeon: Conrad [facility], MD;  Location: [facility] OR;  Service: Vascular;  Laterality: Right;   LUNG BIOPSY     TUBAL LIGATION     Family History  Family History  Problem Relation Age of Onset   Ovarian cancer Mother    Breast cancer Sister    Throat cancer Brother    Social History  reports that she has been smoking cigarettes. She has been smoking an average of .25 packs per day. She has never used smokeless tobacco. She reports that she does not drink alcohol and does not use drugs. Allergies No Known Allergies Home medications Prior to Admission medications   Medication Sig Start Date End Date Taking? Authorizing Provider  acetaminophen (TYLENOL) 325 MG tablet Take 650 mg by mouth every 6 (six) hours as needed for pain.   Yes  [provider]  amLODipine (NORVASC) 5 MG tablet Take 1 tablet (5 mg total) by mouth at bedtime. Patient taking differently: Take 5 mg by mouth at bedtime as needed (high blood pressue). 05/31/18  Yes Oswald Hillock, MD  cinacalcet (SENSIPAR) 60 MG tablet Take 60 mg by mouth 3 (three) times a week. Given at Dialysis on Tues, Thur, Sat   Yes [provider]  sevelamer carbonate (RENVELA) 800 MG tablet Take 1,600 mg by mouth 3 (three) times daily with meals. 09/21/18  Yes [provider]  hydroxychloroquine (PLAQUENIL) 200 MG tablet Take 200 mg by mouth daily.  Patient not taking: Reported on 03/12/2021 06/02/18   [provider]     Vitals:    03/12/21 1000 03/12/21 1046 03/12/21 1100 03/12/21 1154  BP: (!) 181/103  (!) 178/104 (!) 193/98  Pulse: 85     Resp: 20  (!) 22 (!) 26  Temp:      TempSrc:      SpO2: 95%  95% 100%  Weight:  49.9 kg    Height:  5\' 4"  (1.626 m)     Exam Gen was just placed on bipap, RR's 40's >> low 20's now and more comfortable No rash, cyanosis or gangrene Sclera anicteric, throat clear  +jvd Chest bibasilar crackles, o/w clear RRR no MRG Abd soft ntnd no mass or ascites +bs GU normal MS no joint effusions or deformity Ext 1+ pretib edema, no wounds or ulcers Neuro is alert, Ox 3 , nf LUA AVF +accessed    Home meds include - norvasc 5, sensipar, renvela, plaquenil, prns     OP HD: [name]    3h 70min   350/1.5   49.5kg   2/2 bath  LUA AVF  Hep 3600   - sensipar 60 tiw  - hect 6ug IV tiw  - mircera 75u q2, last 10/20   Assessment/ Plan: SOB/ resp distress - most likely pulm edema in this ESRD pt missed HD yesterday. Is getting bipap support and HD now, should be able to wean off bipap as HD progresses.  ESRD- on HD TTS.  HD today in progress.  HTN/ vol -  Anemia ckd - Hb 9's here, next esa due today, will order darbe 60 ug weekly  MBD ckd - cont vdra, sensipar w/ hd H/o SLE H/o COPD      [name]  MD 03/12/2021, 12:12 PM  Recent Labs  Lab 03/12/21 0950 03/12/21 0958  WBC 6.3  --   HGB 8.8* 9.9*   Recent Labs  Lab 03/12/21 0950 03/12/21 0958  K 6.2* 6.3*  BUN 68* 64*  CREATININE 14.66* 17.50*  CALCIUM 8.4*  --

## 2021-03-12 NOTE — ED Provider Notes (Signed)
[facility] DEPT Provider Note   CSN: [id number] Arrival date & time: 03/12/21  9379     History Chief Complaint  Patient presents with   Shortness of Breath    Kathleen [name] is a 60 y.o. female.  Patient complains of shortness of breath.  She was put to get dialysis today but she came to the emergency department instead.  She is not certain when her last dialysis was.  Patient also has a history of lupus and COPD  The history is provided by the patient and medical records. No language interpreter was used.  Shortness of Breath Severity:  Moderate Onset quality:  Sudden Timing:  Constant Progression:  Worsening Chronicity:  Recurrent Context: activity   Relieved by:  Nothing Worsened by:  Exertion Ineffective treatments:  None tried Associated symptoms: no abdominal pain, no chest pain, no cough, no headaches and no rash       Past Medical History:  Diagnosis Date   Anemia    Arthritis    Cardiac arrest (HCC)    Cellulitis    Cerebral vasculitis    COPD (chronic obstructive pulmonary disease) (HCC)    Elevated antinuclear antibody (ANA) level    Endometriosis    ESRD (end stage renal disease) on dialysis (HCC)    Gallstones    Hypertension    Lupus (HCC)    Migraine    PONV (postoperative nausea and vomiting)    Seizures ([place])    Sepsis ([place])    Sleep apnea    Suicide attempt ([place])    TB (tuberculosis)    Transient ischemic attack (TIA)    Vasculitis of skin     Patient Active Problem List   Diagnosis Date Noted   Hyperkalemia 03/12/2021   Metatarsalgia of both feet 11/25/2018   Onychomycosis 11/25/2018   Pre-ulcerative calluses 11/25/2018   Neuropathy 10/04/2018   Headache, unspecified 10/03/2018   Hypoglycemia, unspecified 06/06/2018   Volume overload 05/30/2018   Syncope 10/08/2017   Acute encephalopathy 07/11/2017   ESRD (end stage renal disease) ([place]) 07/11/2017   COPD (chronic obstructive pulmonary  disease) ([place])    Anemia    Coagulation defect, unspecified ([place]) 07/09/2017   Encounter for fitting and adjustment of extracorporeal dialysis catheter ([place]) 07/09/2017   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 ([place]) 07/09/2017   Iron deficiency anemia, unspecified 07/09/2017   Secondary hyperparathyroidism of renal origin ([place]) 07/09/2017   Tobacco use 07/09/2017   Benign essential HTN 07/01/2017   Stage 5 chronic kidney disease not on chronic dialysis ([place])    Hypertensive crisis 06/29/2017   Bilateral hip pain 04/04/2017   PAD (peripheral artery disease) ([place]) 11/30/2016   Tobacco dependence 11/30/2016   Hypertensive emergency    Lupus erythematosus    Malnutrition of moderate degree 07/14/2016   Lupus ([place]) 06/28/2016   Hypertension 06/28/2016   Kidney disease 06/28/2016   Symptomatic anemia 06/28/2016   Abdominal pain 06/28/2016   Current non-adherence to medical treatment 06/28/2016   Hypertensive urgency 06/28/2016   SLE exacerbation ([place]) 06/28/2016   Renal insufficiency    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   High risk medication use 05/31/2012    Past Surgical History:  Procedure Laterality Date   AV FISTULA PLACEMENT Left 07/04/2017   Procedure: ARTERIOVENOUS (AV) FISTULA CREATION;  Surgeon: Conrad Montcalm, MD;  Location: [facility] OR;  Service: Vascular;  Laterality: Left;   AV FISTULA PLACEMENT Left 11/27/2019   Procedure: INSERTION OF ARTERIOVENOUS (  AV) GORE-TEX GRAFT LEFT UPPER ARM;  Surgeon: Angelia Mould, MD;  Location: [facility];  Service: Vascular;  Laterality: Left;   AV FISTULA PLACEMENT Left 07/29/2020   Procedure: INSERTION OF LEFT UPPER ARM ARTERIOVENOUS (AV) GORE-TEX GRAFT;  Surgeon: Angelia Mould, MD;  Location: [facility];  Service: Vascular;  Laterality: Left;   CESAREAN SECTION     ESOPHAGOGASTRODUODENOSCOPY N/A 07/15/2016   Procedure: ESOPHAGOGASTRODUODENOSCOPY (EGD);  Surgeon: Laurence Spates, MD;   Location[facility] ENDOSCOPY;  Service: Endoscopy;  Laterality: N/A;   INSERTION OF DIALYSIS CATHETER Right 07/04/2017   Procedure: INSERTION OF TUNNELED  DIALYSIS CATHETER - RIGHT INTERNAL JUGULAR PLACEMENT;  Surgeon: Conrad McNab, MD;  Location: [facility];  Service: Vascular;  Laterality: Right;   LUNG BIOPSY     TUBAL LIGATION       OB History   No obstetric history on file.     Family History  Problem Relation Age of Onset   Ovarian cancer Mother    Breast cancer Sister    Throat cancer Brother     Social History   Tobacco Use   Smoking status: Some Days    Packs/day: 0.25    Types: Cigarettes   Smokeless tobacco: Never  Vaping Use   Vaping Use: Never used  Substance Use Topics   Alcohol use: No   Drug use: No    Home Medications Prior to Admission medications   Medication Sig Start Date End Date Taking? Authorizing Provider  amLODipine (NORVASC) 5 MG tablet Take 1 tablet (5 mg total) by mouth at bedtime. Patient taking differently: Take 5 mg by mouth at bedtime as needed (high blood pressue). 05/31/18   Oswald Hillock, MD  HYDROcodone-acetaminophen (NORCO/VICODIN) 5-325 MG tablet Take 1 tablet by mouth every 6 (six) hours as needed for moderate pain. Patient not taking: Reported on 10/13/2020 07/29/20 07/29/21  Dagoberto Ligas, PA-C  hydroxychloroquine (PLAQUENIL) 200 MG tablet Take 200 mg by mouth daily.  06/02/18   [provider]  sevelamer carbonate (RENVELA) 800 MG tablet Take 1,600 mg by mouth 2 (two) times daily with a meal. 09/21/18   [provider]    Allergies    Patient has no known allergies.  Review of Systems   Review of Systems  Constitutional:  Negative for appetite change and fatigue.  HENT:  Negative for congestion, ear discharge and sinus pressure.   Eyes:  Negative for discharge.  Respiratory:  Positive for shortness of breath. Negative for cough.   Cardiovascular:  Negative for chest pain.  Gastrointestinal:  Negative for abdominal  pain and diarrhea.  Genitourinary:  Negative for frequency and hematuria.  Musculoskeletal:  Negative for back pain.  Skin:  Negative for rash.  Neurological:  Negative for seizures and headaches.  Psychiatric/Behavioral:  Negative for hallucinations.    Physical Exam Updated Vital Signs BP (!) 181/103   Pulse 85   Temp 97.8 F (36.6 C) (Oral)   Resp 20   SpO2 95%   Physical Exam Vitals and nursing note reviewed.  Constitutional:      General: She is in acute distress.     Appearance: She is well-developed.  HENT:     Head: Normocephalic.     Mouth/Throat:     Mouth: Mucous membranes are moist.  Eyes:     General: No scleral icterus.    Conjunctiva/sclera: Conjunctivae normal.  Neck:     Thyroid: No thyromegaly.  Cardiovascular:     Rate and Rhythm: Normal rate  and regular rhythm.     Heart sounds: No murmur heard.   No friction rub. No gallop.  Pulmonary:     Breath sounds: No stridor. No wheezing or rales.  Chest:     Chest wall: No tenderness.  Abdominal:     General: There is no distension.     Tenderness: There is no abdominal tenderness. There is no rebound.  Musculoskeletal:        General: Normal range of motion.     Cervical back: Neck supple.  Lymphadenopathy:     Cervical: No cervical adenopathy.  Skin:    Findings: No erythema or rash.  Neurological:     Mental Status: She is alert and oriented to person, place, and time.     Motor: No abnormal muscle tone.     Coordination: Coordination normal.  Psychiatric:        Behavior: Behavior normal.    ED Results / Procedures / Treatments   Labs (all labs ordered are listed, but only abnormal results are displayed) Labs Reviewed  CBC WITH DIFFERENTIAL/PLATELET - Abnormal; Notable for the following components:      Result Value   RBC 2.92 (*)    Hemoglobin 8.8 (*)    HCT 28.4 (*)    RDW 15.9 (*)    All other components within normal limits  I-STAT CHEM 8, ED - Abnormal; Notable for the following  components:   Potassium 6.3 (*)    BUN 64 (*)    Creatinine, Ser 17.50 (*)    Calcium, Ion 1.03 (*)    Hemoglobin 9.9 (*)    HCT 29.0 (*)    All other components within normal limits  RESP PANEL BY RT-PCR (FLU A&B, COVID) ARPGX2  COMPREHENSIVE METABOLIC PANEL  BRAIN NATRIURETIC PEPTIDE  TROPONIN I (HIGH SENSITIVITY)  TROPONIN I (HIGH SENSITIVITY)    EKG None  Radiology DG Chest 2 View  Result Date: 03/12/2021 CLINICAL DATA:  shortness of breath EXAM: CHEST - 2 VIEW COMPARISON:  March 02, 2021. FINDINGS: Patchy interstitial airspace opacities in the right greater than left lungs. Interstitial opacities are slightly less conspicuous; however, right basilar airspace opacities are more conspicuous. Enlarged cardiac silhouette. Central pulmonary vascular congestion. IMPRESSION: 1. Patchy interstitial airspace opacities in the right greater than left lungs. Interstitial opacities are slightly less conspicuous; however, right basilar airspace opacities are more conspicuous. Findings could represent multifocal pneumonia or asymmetric edema. 2. Mild cardiomegaly and central pulmonary vascular congestion. Electronically Signed   By: Margaretha Sheffield M.D.   On: 03/12/2021 08:58    Procedures Procedures   Medications Ordered in ED Medications  sodium zirconium cyclosilicate (LOKELMA) packet 10 g (has no administration in time range)    ED Course  I have reviewed the triage vital signs and the nursing notes.  Pertinent labs & imaging results that were available during my care of the patient were reviewed by me and considered in my medical decision making (see chart for details).   CRITICAL CARE Performed by: Milton Ferguson Total critical care time: 45 minutes Critical care time was exclusive of separately billable procedures and treating other patients. Critical care was necessary to treat or prevent imminent or life-threatening deterioration. Critical care was time spent personally by  me on the following activities: development of treatment plan with patient and/or surrogate as well as nursing, discussions with consultants, evaluation of patient's response to treatment, examination of patient, obtaining history from patient or surrogate, ordering and performing treatments and interventions, ordering  and review of laboratory studies, ordering and review of radiographic studies, pulse oximetry and re-evaluation of patient's condition. I spoke with nephrology Dr. Jonnie Finner and he stated that the patient needs to be admitted over [facility] and get dialysis. MDM Rules/Calculators/A&P                          Patient will be admitted to hospitalist to [facility] and have nephrology consult and have dialysis Final Clinical Impression(s) / ED Diagnoses Final diagnoses:  None    Rx / DC Orders ED Discharge Orders     None        Milton Ferguson, MD 03/12/21 1034

## 2021-03-13 ENCOUNTER — Observation Stay (HOSPITAL_BASED_OUTPATIENT_CLINIC_OR_DEPARTMENT_OTHER): Payer: Medicare Other

## 2021-03-13 DIAGNOSIS — I739 Peripheral vascular disease, unspecified: Secondary | ICD-10-CM | POA: Diagnosis present

## 2021-03-13 DIAGNOSIS — Z808 Family history of malignant neoplasm of other organs or systems: Secondary | ICD-10-CM | POA: Diagnosis not present

## 2021-03-13 DIAGNOSIS — Z8041 Family history of malignant neoplasm of ovary: Secondary | ICD-10-CM | POA: Diagnosis not present

## 2021-03-13 DIAGNOSIS — I5033 Acute on chronic diastolic (congestive) heart failure: Secondary | ICD-10-CM

## 2021-03-13 DIAGNOSIS — M898X9 Other specified disorders of bone, unspecified site: Secondary | ICD-10-CM | POA: Diagnosis present

## 2021-03-13 DIAGNOSIS — Z803 Family history of malignant neoplasm of breast: Secondary | ICD-10-CM | POA: Diagnosis not present

## 2021-03-13 DIAGNOSIS — Z8674 Personal history of sudden cardiac arrest: Secondary | ICD-10-CM | POA: Diagnosis not present

## 2021-03-13 DIAGNOSIS — F17210 Nicotine dependence, cigarettes, uncomplicated: Secondary | ICD-10-CM | POA: Diagnosis present

## 2021-03-13 DIAGNOSIS — E875 Hyperkalemia: Secondary | ICD-10-CM | POA: Diagnosis present

## 2021-03-13 DIAGNOSIS — Z79899 Other long term (current) drug therapy: Secondary | ICD-10-CM | POA: Diagnosis not present

## 2021-03-13 DIAGNOSIS — J449 Chronic obstructive pulmonary disease, unspecified: Secondary | ICD-10-CM | POA: Diagnosis present

## 2021-03-13 DIAGNOSIS — N2581 Secondary hyperparathyroidism of renal origin: Secondary | ICD-10-CM | POA: Diagnosis present

## 2021-03-13 DIAGNOSIS — G40909 Epilepsy, unspecified, not intractable, without status epilepticus: Secondary | ICD-10-CM | POA: Diagnosis present

## 2021-03-13 DIAGNOSIS — Z992 Dependence on renal dialysis: Secondary | ICD-10-CM | POA: Diagnosis not present

## 2021-03-13 DIAGNOSIS — J9601 Acute respiratory failure with hypoxia: Secondary | ICD-10-CM | POA: Diagnosis present

## 2021-03-13 DIAGNOSIS — R0602 Shortness of breath: Secondary | ICD-10-CM | POA: Diagnosis present

## 2021-03-13 DIAGNOSIS — M3214 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: Secondary | ICD-10-CM | POA: Diagnosis present

## 2021-03-13 DIAGNOSIS — Z20822 Contact with and (suspected) exposure to covid-19: Secondary | ICD-10-CM | POA: Diagnosis present

## 2021-03-13 DIAGNOSIS — Z9151 Personal history of suicidal behavior: Secondary | ICD-10-CM | POA: Diagnosis not present

## 2021-03-13 DIAGNOSIS — I132 Hypertensive heart and chronic kidney disease with heart failure and with stage 5 chronic kidney disease, or end stage renal disease: Secondary | ICD-10-CM | POA: Diagnosis present

## 2021-03-13 DIAGNOSIS — G43909 Migraine, unspecified, not intractable, without status migrainosus: Secondary | ICD-10-CM | POA: Diagnosis present

## 2021-03-13 DIAGNOSIS — D631 Anemia in chronic kidney disease: Secondary | ICD-10-CM | POA: Diagnosis present

## 2021-03-13 DIAGNOSIS — G4733 Obstructive sleep apnea (adult) (pediatric): Secondary | ICD-10-CM | POA: Diagnosis present

## 2021-03-13 DIAGNOSIS — N186 End stage renal disease: Secondary | ICD-10-CM | POA: Diagnosis present

## 2021-03-13 DIAGNOSIS — Z8673 Personal history of transient ischemic attack (TIA), and cerebral infarction without residual deficits: Secondary | ICD-10-CM | POA: Diagnosis not present

## 2021-03-13 LAB — ECHOCARDIOGRAM COMPLETE
Area-P 1/2: 5.58 cm2
Calc EF: 40.7 %
Height: 64 in
P 1/2 time: 712 msec
S' Lateral: 3.4 cm
Single Plane A2C EF: 32.6 %
Single Plane A4C EF: 44.7 %
Weight: 1770.73 oz

## 2021-03-13 LAB — BASIC METABOLIC PANEL
Anion gap: 11 (ref 5–15)
BUN: 31 mg/dL — ABNORMAL HIGH (ref 6–20)
CO2: 28 mmol/L (ref 22–32)
Calcium: 8.5 mg/dL — ABNORMAL LOW (ref 8.9–10.3)
Chloride: 96 mmol/L — ABNORMAL LOW (ref 98–111)
Creatinine, Ser: 8.92 mg/dL — ABNORMAL HIGH (ref 0.44–1.00)
GFR, Estimated: 5 mL/min — ABNORMAL LOW (ref >60)
Glucose, Bld: 81 mg/dL (ref 70–99)
Potassium: 4.4 mmol/L (ref 3.5–5.1)
Sodium: 135 mmol/L (ref 135–145)

## 2021-03-13 LAB — CBC
HCT: 27.7 % — ABNORMAL LOW (ref 36.0–46.0)
Hemoglobin: 8.7 g/dL — ABNORMAL LOW (ref 12.0–15.0)
MCH: 29.7 pg (ref 26.0–34.0)
MCHC: 31.4 g/dL (ref 30.0–36.0)
MCV: 94.5 fL (ref 80.0–100.0)
Platelets: 206 10*3/uL (ref 150–400)
RBC: 2.93 MIL/uL — ABNORMAL LOW (ref 3.87–5.11)
RDW: 15.6 % — ABNORMAL HIGH (ref 11.5–15.5)
WBC: 5.1 10*3/uL (ref 4.0–10.5)
nRBC: 0 % (ref 0.0–0.2)

## 2021-03-13 LAB — HEPATITIS B SURFACE ANTIBODY, QUANTITATIVE: Hep B S AB Quant (Post): 26.3 m[IU]/mL (ref >9.9)

## 2021-03-13 LAB — HIV ANTIBODY (ROUTINE TESTING W REFLEX): HIV Screen 4th Generation wRfx: NONREACTIVE

## 2021-03-13 NOTE — Progress Notes (Signed)
[facility] Progress Note  Subjective: seen in room, reporting that her transportation company not picking her up properly from her home to take her to HD, repeatedly  Vitals:   03/12/21 2000 03/13/21 0000 03/13/21 0400 03/13/21 0800  BP:    (!) 112/92  Pulse: 95 96    Resp:    18  Temp: 99.1 F (37.3 C) 99.1 F (37.3 C) 98.5 F (36.9 C) 98.2 F (36.8 C)  TempSrc: Oral Oral Oral Oral  SpO2: 100% 100%    Weight:      Height:        Exam:  alert, nad   no jvd  Chest scant rales at the bases  Cor reg no RG  Abd soft ntnd no ascites   Ext no LE edema   Alert, NF, ox3 LUA AVF +bruit      Home meds include - norvasc 5, sensipar, renvela, plaquenil, prns        OP HD: [name]    3h 77min   350/1.5   49.5kg   2/2 bath  LUA AVF  Hep 3600   - sensipar 60 tiw  - hect 6ug IV tiw  - mircera 75u q2, last 10/20     Assessment/ Plan: SOB/ resp distress - due to pulm edema in ESRD pt w/ missed HD. Improved w/ HD yesterday, cramped after 3.2 L removed. Weaned down to [place] O2 today, looks better. Next HD tomorrow if still here.  ESRD- on HD TTS.  HD tomorrow.  HTN/ vol - 1kg over dry wt today, not grossly overloaded on exam. Will ask for O2 wean and see if she requires oxygen still today.  Anemia ckd - Hb 9's, next esa due, ordered darbe 60 ug here MBD ckd - cont vdra, sensipar w/ hd H/o SLE H/o COPD     Rob Yasenia Reedy 03/13/2021, 11:36 AM   Recent Labs  Lab 03/12/21 0950 03/12/21 0958 03/13/21 0227  K 6.2* 6.3* 4.4  BUN 68* 64* 31*  CREATININE 14.66* 17.50* 8.92*  CALCIUM 8.4*  --  8.5*  HGB 8.8* 9.9* 8.7*   Inpatient medications:  amLODipine  5 mg Oral QHS   Chlorhexidine Gluconate Cloth  6 each Topical Q0600   [START ON 03/14/2021] cinacalcet  60 mg Oral Q T,Th,Sa-HD   heparin  3,000 Units Dialysis Once in dialysis   heparin  5,000 Units Subcutaneous Q8H   sevelamer carbonate  1,600 mg Oral TID WC    acetaminophen **OR** acetaminophen,  ipratropium-albuterol, prochlorperazine

## 2021-03-13 NOTE — Progress Notes (Signed)
PROGRESS NOTE    Kathleen [name]  WVP:[id number] DOB: December 29, 1960 DOA: 03/12/2021 PCP: Angela Burke, NP   Chief Complaint  Patient presents with   Shortness of Breath   Brief Narrative/Hospital Course:  Kathleen [name], 59 y.o. female with PMH of ESRD on HD anemia, osteoarthritis history of cardiac arrest, cellulitis history of cerebellar disking vasculitis, SLE, COPD, endometriosis,gallstone, hypertension, migraine headaches, seizure disorder, history of sepsis, sleep apnea, depression, history of suicide attempt, history of tuberculosis, history of TIA came to the ED for shortness of breath after missing dialysis for several sessions. She also had some cough but no fever chills chest pain nausea vomiting or diarrhea. She was seen in the ED was dyspneic subsequently became hypoxic 70s to 80s on 2 L nasal cannula placed on 6 L of the Further given Lokelma for hyperkalemia x-ray showed cardiomegaly central vascular congestion with patchy interstitial airspace opacities right greater than left multifocal pneumonia versus asymmetric edema in the differential, but without leukocytosis fever less likely pneumonia.  Felt to be fluid overload.  Nephro was consulted and urgently transferred to [facility] for dialysis. In the dialysis - had rapid response due to respiratory distress placed on BiPAP completed dialysis and subsequently weaned off BiPAP to 4 L nasal cannula and transferred to progressive unit   Subjective: Did not use CPAP overnight, on 4 L nasal cannula. This morning she feels much better No leg edema. She reports transportation has not been picking her up-missed HD session last week 2.  Assessment & Plan:  Acute respiratory distress due to fluid overload from missed HD Acute hypoxic respiratory failure due to above Acute on chronic diastolic CHF worsened by missed HD: Chest x-ray with congestion elevated BNP and respiratory distress hypoxic on admission.  Also had rapid  response needing BiPAP during HD.After HD x1 yesterday feeling better, continue to wean down oxygen, nephrology following.  Follow-up echocardiogram today.  Patient reports having some transportation issues nephro aware.  Hyperkalemia status post Lokelma and HD.  Resolved Hypertension urgency blood pressure in 200 causing respiratory distress, due to fluid overload improving with HD.  Continue her home home amlodipine  COPD: Continue as bronchodilators, supplemental oxygen.Not wheezing but has significant diminished breath sound bilaterally  Anemia in ESRD: Hemoglobin stable.  Iron as per nephrology Metabolic bone disease: Continue her Sensipar and regular  PAD: Smoking cessation was advised, follow-up with PCP and vascular surgery.  Currently  Tobacco use: Tobacco cessation and continue nicotine replacement therapy  Prolonged QT interval-monitor   Low BMI Patient's Body mass index is 19 kg/m. : Will benefit with diet supplementation augmentation   DVT prophylaxis: heparin injection 5,000 Units Start: 03/12/21 1400 Code Status:   Code Status: Full Code Family Communication: plan of care discussed with patient at bedside. Status is:  admitted as Observation Remains hospitalized at least 2 midnight needed due to her respiratory distress hypoxia fluid overload. If continues to improve can downgrade to MedSurg bed today and anticipate discharge over the weekend  Objective: Vitals last 24 hrs: Vitals:   03/12/21 2000 03/13/21 0000 03/13/21 0400 03/13/21 0800  BP:    (!) 112/92  Pulse: 95 96    Resp:    18  Temp: 99.1 F (37.3 C) 99.1 F (37.3 C) 98.5 F (36.9 C) 98.2 F (36.8 C)  TempSrc: Oral Oral Oral Oral  SpO2: 100% 100%    Weight:      Height:       Weight change:   Intake/Output  Summary (Last 24 hours) at 03/13/2021 1007 Last data filed at 03/12/2021 1612 Gross per 24 hour  Intake --  Output 3207 ml  Net -3207 ml   Net IO Since Admission: -3,207 mL [03/13/21 1007]    Physical Examination: General exam: Aa0x3, thin frail, weak,older than stated age. HEENT:Oral mucosa moist, Ear/Nose WNL grossly,dentition normal. Respiratory system: B/l diminished-without wheezing, on nasal cannula, no use of accessory muscle, non tender. Cardiovascular system: S1 & S2 +,No JVD. Gastrointestinal system: Abdomen soft, NT,ND, BS+. Nervous System:Alert, awake, moving extremities. Extremities: edema none, distal peripheral pulses palpable.  Skin: No rashes, no icterus. MSK: Normal muscle bulk, tone, power.  Medications reviewed:  Scheduled Meds:  amLODipine  5 mg Oral QHS   Chlorhexidine Gluconate Cloth  6 each Topical Q0600   [START ON 03/14/2021] cinacalcet  60 mg Oral Q T,Th,Sa-HD   heparin  3,000 Units Dialysis Once in dialysis   heparin  5,000 Units Subcutaneous Q8H   sevelamer carbonate  1,600 mg Oral TID WC   Continuous Infusions:  Diet Order             Diet heart healthy/carb modified Room service appropriate? Yes; Fluid consistency: Thin  Diet effective now                 Weight change:   Wt Readings from Last 3 Encounters:  03/12/21 50.2 kg  10/13/20 50.3 kg  07/29/20 49.9 kg     Consultants:see note  Procedures:see note Antimicrobials: Anti-infectives (From admission, onward)    None      Culture/Microbiology    Component Value Date/Time   SDES BLOOD RIGHT ANTECUBITAL 07/11/2017 0549   SPECREQUEST IN PEDIATRIC BOTTLE Blood Culture adequate volume 07/11/2017 0549   CULT  07/11/2017 0549    NO GROWTH 5 DAYS Performed at [facility], [place] [address], [place], [place] [address]    REPTSTATUS 07/16/2017 FINAL 07/11/2017 0549    Other culture-see note  Unresulted Labs (From admission, onward)     Start     Ordered   03/13/21 6237  Basic metabolic panel  Daily,   R      03/12/21 1219          Data Reviewed: I have personally reviewed following labs and imaging studies CBC: Recent Labs  Lab 03/12/21 0950  03/12/21 0958 03/13/21 0227  WBC 6.3  --  5.1  NEUTROABS 3.5  --   --   HGB 8.8* 9.9* 8.7*  HCT 28.4* 29.0* 27.7*  MCV 97.3  --  94.5  PLT 218  --  628   Basic Metabolic Panel: Recent Labs  Lab 03/12/21 0950 03/12/21 0958 03/13/21 0227  NA 137 139 135  K 6.2* 6.3* 4.4  CL 102 105 96*  CO2 23  --  28  GLUCOSE 89 84 81  BUN 68* 64* 31*  CREATININE 14.66* 17.50* 8.92*  CALCIUM 8.4*  --  8.5*   GFR: Estimated Creatinine Clearance: 5.3 mL/min (A) (by C-G formula based on SCr of 8.92 mg/dL (H)). Liver Function Tests: Recent Labs  Lab 03/12/21 0950  AST 29  ALT 15  ALKPHOS 80  BILITOT 0.8  PROT 7.5  ALBUMIN 3.6   No results for input(s): LIPASE, AMYLASE in the last 168 hours. No results for input(s): AMMONIA in the last 168 hours. Coagulation Profile: No results for input(s): INR, PROTIME in the last 168 hours. Cardiac Enzymes: No results for input(s): CKTOTAL, CKMB, CKMBINDEX, TROPONINI in the last 168 hours.  BNP (last 3 results) No results for input(s): PROBNP in the last 8760 hours. HbA1C: No results for input(s): HGBA1C in the last 72 hours. CBG: No results for input(s): GLUCAP in the last 168 hours. Lipid Profile: No results for input(s): CHOL, HDL, LDLCALC, TRIG, CHOLHDL, LDLDIRECT in the last 72 hours. Thyroid Function Tests: No results for input(s): TSH, T4TOTAL, FREET4, T3FREE, THYROIDAB in the last 72 hours. Anemia Panel: No results for input(s): VITAMINB12, FOLATE, FERRITIN, TIBC, IRON, RETICCTPCT in the last 72 hours. Sepsis Labs: No results for input(s): PROCALCITON, LATICACIDVEN in the last 168 hours.  Recent Results (from the past 240 hour(s))  Resp Panel by RT-PCR (Flu A&B, Covid) Nasopharyngeal Swab     Status: None   Collection Time: 03/12/21  9:54 AM   Specimen: Nasopharyngeal Swab; Nasopharyngeal(NP) swabs in vial transport medium  Result Value Ref Range Status   SARS Coronavirus 2 by RT PCR NEGATIVE NEGATIVE Final    Comment:  (NOTE) SARS-CoV-2 target nucleic acids are NOT DETECTED.  The SARS-CoV-2 RNA is generally detectable in upper respiratory specimens during the acute phase of infection. The lowest concentration of SARS-CoV-2 viral copies this assay can detect is 138 copies/mL. A negative result does not preclude SARS-Cov-2 infection and should not be used as the sole basis for treatment or other patient management decisions. A negative result may occur with  improper specimen collection/handling, submission of specimen other than nasopharyngeal swab, presence of viral mutation(s) within the areas targeted by this assay, and inadequate number of viral copies(<138 copies/mL). A negative result must be combined with clinical observations, patient history, and epidemiological information. The expected result is Negative.  Fact Sheet for Patients:  EntrepreneurPulse.com.au  Fact Sheet for Healthcare Providers:  IncredibleEmployment.be  This test is no t yet approved or cleared by the [place] FDA and  has been authorized for detection and/or diagnosis of SARS-CoV-2 by FDA under an Emergency Use Authorization (EUA). This EUA will remain  in effect (meaning this test can be used) for the duration of the COVID-19 declaration under Section 564(b)(1) of the Act, 21 U.S.C.section 360bbb-3(b)(1), unless the authorization is terminated  or revoked sooner.       Influenza A by PCR NEGATIVE NEGATIVE Final   Influenza B by PCR NEGATIVE NEGATIVE Final    Comment: (NOTE) The Xpert Xpress SARS-CoV-2/FLU/RSV plus assay is intended as an aid in the diagnosis of influenza from Nasopharyngeal swab specimens and should not be used as a sole basis for treatment. Nasal washings and aspirates are unacceptable for Xpert Xpress SARS-CoV-2/FLU/RSV testing.  Fact Sheet for Patients: EntrepreneurPulse.com.au  Fact Sheet for Healthcare  Providers: IncredibleEmployment.be  This test is not yet approved or cleared by the [place] FDA and has been authorized for detection and/or diagnosis of SARS-CoV-2 by FDA under an Emergency Use Authorization (EUA). This EUA will remain in effect (meaning this test can be used) for the duration of the COVID-19 declaration under Section 564(b)(1) of the Act, 21 U.S.C. section 360bbb-3(b)(1), unless the authorization is terminated or revoked.  Performed at [facility], [place] [address], [place], [place] [address]      Radiology Studies: DG Chest 2 View  Result Date: 03/12/2021 CLINICAL DATA:  shortness of breath EXAM: CHEST - 2 VIEW COMPARISON:  March 02, 2021. FINDINGS: Patchy interstitial airspace opacities in the right greater than left lungs. Interstitial opacities are slightly less conspicuous; however, right basilar airspace opacities are more conspicuous. Enlarged cardiac silhouette. Central pulmonary vascular congestion. IMPRESSION: 1.  Patchy interstitial airspace opacities in the right greater than left lungs. Interstitial opacities are slightly less conspicuous; however, right basilar airspace opacities are more conspicuous. Findings could represent multifocal pneumonia or asymmetric edema. 2. Mild cardiomegaly and central pulmonary vascular congestion. Electronically Signed   By: Margaretha Sheffield M.D.   On: 03/12/2021 08:58     LOS: 0 days   Antonieta Pert, MD [facility]  03/13/2021, 10:07 AM

## 2021-03-13 NOTE — Care Management Obs Status (Signed)
[place] NOTIFICATION   Patient Details  Name: Kathleen [name] MRN: [id number] Date of Birth: 06-17-1960   Medicare Observation Status Notification Given:  Yes    Joanne Chars, LCSW 03/13/2021, 2:47 PM

## 2021-03-13 NOTE — Plan of Care (Signed)
  Problem: Education: Goal: Knowledge of General Education information will improve Description: Including pain rating scale, medication(s)/side effects and non-pharmacologic comfort measures Outcome: Progressing   Problem: Health Behavior/Discharge Planning: Goal: Ability to manage health-related needs will improve Outcome: Progressing   Problem: Clinical Measurements: Goal: Ability to maintain clinical measurements within normal limits will improve Outcome: Progressing Goal: Respiratory complications will improve Outcome: Progressing Goal: Cardiovascular complication will be avoided Outcome: Progressing   Problem: Activity: Goal: Risk for activity intolerance will decrease Outcome: Progressing   Problem: Nutrition: Goal: Adequate nutrition will be maintained Outcome: Progressing   Problem: Pain Managment: Goal: General experience of comfort will improve Outcome: Progressing   Problem: Safety: Goal: Ability to remain free from injury will improve Outcome: Progressing   Problem: Skin Integrity: Goal: Risk for impaired skin integrity will decrease Outcome: Progressing   

## 2021-03-14 DIAGNOSIS — J410 Simple chronic bronchitis: Secondary | ICD-10-CM

## 2021-03-14 DIAGNOSIS — M3214 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: Secondary | ICD-10-CM

## 2021-03-14 DIAGNOSIS — N186 End stage renal disease: Secondary | ICD-10-CM | POA: Diagnosis not present

## 2021-03-14 DIAGNOSIS — I5033 Acute on chronic diastolic (congestive) heart failure: Secondary | ICD-10-CM | POA: Diagnosis not present

## 2021-03-14 DIAGNOSIS — D631 Anemia in chronic kidney disease: Secondary | ICD-10-CM

## 2021-03-14 DIAGNOSIS — J9601 Acute respiratory failure with hypoxia: Secondary | ICD-10-CM

## 2021-03-14 DIAGNOSIS — E875 Hyperkalemia: Secondary | ICD-10-CM | POA: Diagnosis not present

## 2021-03-14 LAB — RENAL FUNCTION PANEL
Albumin: 3 g/dL — ABNORMAL LOW (ref 3.5–5.0)
Anion gap: 15 (ref 5–15)
BUN: 51 mg/dL — ABNORMAL HIGH (ref 6–20)
CO2: 25 mmol/L (ref 22–32)
Calcium: 8.5 mg/dL — ABNORMAL LOW (ref 8.9–10.3)
Chloride: 96 mmol/L — ABNORMAL LOW (ref 98–111)
Creatinine, Ser: 11.66 mg/dL — ABNORMAL HIGH (ref 0.44–1.00)
GFR, Estimated: 3 mL/min — ABNORMAL LOW (ref >60)
Glucose, Bld: 79 mg/dL (ref 70–99)
Phosphorus: 5.6 mg/dL — ABNORMAL HIGH (ref 2.5–4.6)
Potassium: 4.8 mmol/L (ref 3.5–5.1)
Sodium: 136 mmol/L (ref 135–145)

## 2021-03-14 LAB — CBC
HCT: 26 % — ABNORMAL LOW (ref 36.0–46.0)
Hemoglobin: 7.9 g/dL — ABNORMAL LOW (ref 12.0–15.0)
MCH: 29.3 pg (ref 26.0–34.0)
MCHC: 30.4 g/dL (ref 30.0–36.0)
MCV: 96.3 fL (ref 80.0–100.0)
Platelets: 194 10*3/uL (ref 150–400)
RBC: 2.7 MIL/uL — ABNORMAL LOW (ref 3.87–5.11)
RDW: 15.7 % — ABNORMAL HIGH (ref 11.5–15.5)
WBC: 6.8 10*3/uL (ref 4.0–10.5)
nRBC: 0 % (ref 0.0–0.2)

## 2021-03-14 LAB — BASIC METABOLIC PANEL
Anion gap: 12 (ref 5–15)
BUN: 50 mg/dL — ABNORMAL HIGH (ref 6–20)
CO2: 27 mmol/L (ref 22–32)
Calcium: 8.3 mg/dL — ABNORMAL LOW (ref 8.9–10.3)
Chloride: 96 mmol/L — ABNORMAL LOW (ref 98–111)
Creatinine, Ser: 11.64 mg/dL — ABNORMAL HIGH (ref 0.44–1.00)
GFR, Estimated: 3 mL/min — ABNORMAL LOW (ref >60)
Glucose, Bld: 84 mg/dL (ref 70–99)
Potassium: 4.5 mmol/L (ref 3.5–5.1)
Sodium: 135 mmol/L (ref 135–145)

## 2021-03-14 MED ORDER — HEPARIN SODIUM (PORCINE) 1000 UNIT/ML DIALYSIS: 3500.0000 [IU] | Freq: Once | INTRAMUSCULAR | Status: DC

## 2021-03-14 NOTE — Progress Notes (Addendum)
PROGRESS NOTE    Dellene Cumberledge  WUJ:[id number] DOB: [birth year] DOA: 03/12/2021 PCP: Hildred Priest, NP    Brief Narrative:  Mrs. Reindl was admitted to the hospital with the working diagnosis of acute hypoxemic respiratory failure due to volume overload in the setting of ESRD and missing outpatient dialysis.   60 yo female with the past medical history of ESRD, chronic anemia, osteoarthritis, cardiac arrest, vasculitis, SLE, COPD, gallstones, hypertension, migraine headaches, seizures, sleep apnea, tuberculosis, TIA, depression, who presented with dyspnea.  Patient reported productive cough with whitish sputum, no fevers no chills.  She does not recall when was last time she received hemodialysis.  On her initial physical examination her temperature was 97.8, heart rate 85, respiratory rate 20, blood pressure 172/96, oxygen saturation 70s and low 80s on nasal cannula 2 L/min.  She was ill-looking appearing, dyspneic, decreased air movement on lung examination with bilateral rhonchi and rales, heart S1-S2, present, rhythmic, abdomen soft nontender, no significant lower extremity edema. Hemodialysis access left upper extremity AV graft.  Sodium 137, potassium 6.2, chloride 102, bicarb 23, glucose 89, BUN 68, creatinine 14.6, BNP 2335, high sensitive troponin 45, white count 6.3, hemoglobin 8.8, hematocrit 28.4, platelets 218. SARS COVID-19 negative.  Her chest radiograph had cardiomegaly, hilar vascular congestion, fluid in the right fissure.  EKG 84 bpm, normal axis, QTC 511, sinus rhythm with poor R wave progression, no significant ST segment or T wave changes, positive LVH.  He developed rapid worsening respiratory distress, placed on noninvasive mechanical ventilation and underwent emergent hemodialysis.  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  Hyperkalemia.  Patient had HD on 11/03 on admission, with improvement in hyperkalemia and hypervolemia.  Her oxygenation improved but continue to use supplemental 02 4 L per [place], to keep oxygen saturation 92% or greater.  Had HD today, with good toleration, 2,500 cc ultrafiltration with further improvement in volume status. Plan to wean off supplemental 02, if good toleration will plan to dc home today.   Anemia of chronic renal disease, hgb is 7,9 with hct at 26. Continue close follow up, EPO on HD   Metabolic bone disease continue with cinacalcet and sevalamer  2. Acute on chronic diastolic heart failure, HTN, prolonged Qtc.  Continue blood pressure control with amlodipine.   3. SLE stable with no signs of exacerbation.  Continue with hydroxychloroquine.   4. HTN. Continue blood pressure control with amlodipine.    Status is: Inpatient  Remains inpatient appropriate because: needs of supplemental 02    DVT prophylaxis:  Heparin   Code Status:    full  Family Communication:   No family at the bedside     Consultants:  Nephrology      Subjective: Patient with no nausea or vomiting, had HD today with good toleration, dyspnea has improved but not yet back to baseline, continue using supplemental 02 per [place].   Objective: Vitals:   03/14/21 1000 03/14/21 1030  03/14/21 1048 03/14/21 1148  BP: 113/68 126/76 126/76 114/72  Pulse:   77 89  Resp: 16 16 19 19   Temp:   (!) 97.1 F (36.2 C) 97.8 F (36.6 C)  TempSrc:   Temporal Oral  SpO2:   99% 98%  Weight:    48.7 kg   Height:        Intake/Output Summary (Last 24 hours) at 03/14/2021 1526 Last data filed at 03/14/2021 1048 Gross per 24 hour  Intake --  Output 2500 ml  Net -2500 ml   Filed Weights   03/12/21 1626 03/14/21 0740 03/14/21 1048  Weight: 50.2 kg 51.2 kg 48.7 kg    Examination:   General: Not in pain or dyspnea. Deconditioned  Neurology: Awake and alert, non focal  E ENT: mild pallor, no icterus, oral mucosa moist Cardiovascular: No JVD. S1-S2 present, rhythmic, no gallops, rubs, or murmurs. No lower extremity edema. Pulmonary: positive breath sounds bilaterally, adequate air movement, no wheezing, rhonchi or rales. Gastrointestinal. Abdomen soft and non tender Skin. No rashes Musculoskeletal: no joint deformities     Data Reviewed: I have personally reviewed following labs and imaging studies  CBC: Recent Labs  Lab 03/12/21 0950 03/12/21 0958 03/13/21 0227 03/14/21 0811  WBC 6.3  --  5.1 6.8  NEUTROABS 3.5  --   --   --   HGB 8.8* 9.9* 8.7* 7.9*  HCT 28.4* 29.0* 27.7* 26.0*  MCV 97.3  --  94.5 96.3  PLT 218  --  206 194   Basic Metabolic Panel: Recent Labs  Lab 03/12/21 0950 03/12/21 0958 03/13/21 0227 03/14/21 0450 03/14/21 0812  NA 137 139 135 135 136  K 6.2* 6.3* 4.4 4.5 4.8  CL 102 105 96* 96* 96*  CO2 23  --  28 27 25   GLUCOSE 89 84 81 84 79  BUN 68* 64* 31* 50* 51*  CREATININE 14.66* 17.50* 8.92* 11.64* 11.66*  CALCIUM 8.4*  --  8.5* 8.3* 8.5*  PHOS  --   --   --   --  5.6*   GFR: Estimated Creatinine Clearance: 3.9 mL/min (A) (by C-G formula based on SCr of 11.66 mg/dL (H)). Liver Function Tests: Recent Labs  Lab 03/12/21 0950 03/14/21 0812  AST 29  --   ALT 15  --   ALKPHOS 80  --   BILITOT 0.8  --   PROT 7.5  --   ALBUMIN 3.6 3.0*   No results for input(s): LIPASE, AMYLASE in the last 168 hours. No results for input(s): AMMONIA in the last 168 hours. Coagulation Profile: No results for input(s): INR, PROTIME in the last  168 hours. Cardiac Enzymes: No results for input(s): CKTOTAL, CKMB, CKMBINDEX, TROPONINI in the last 168 hours. BNP (last 3 results) No results for input(s): PROBNP in the last 8760 hours. HbA1C: No results for input(s): HGBA1C in the last 72 hours. CBG: No results for input(s): GLUCAP in the last 168 hours. Lipid Profile: No results for input(s): CHOL, HDL, LDLCALC, TRIG, CHOLHDL, LDLDIRECT in the last 72 hours. Thyroid Function Tests: No results for input(s): TSH, T4TOTAL, FREET4, T3FREE, THYROIDAB in the last 72 hours. Anemia Panel: No results for input(s): VITAMINB12, FOLATE, FERRITIN, TIBC, IRON, RETICCTPCT in the last 72 hours.    Radiology Studies: I have reviewed all of the imaging during this hospital visit personally     Scheduled Meds:  amLODipine  5 mg Oral QHS   Chlorhexidine Gluconate Cloth  6 each Topical Q0600  cinacalcet  60 mg Oral Q T,Th,Sa-HD   heparin  3,000 Units Dialysis Once in dialysis   heparin  5,000 Units Subcutaneous Q8H   sevelamer carbonate  1,600 mg Oral TID WC   Continuous Infusions:   LOS: 1 day        Dayanna Pryce Annett Gula, MD

## 2021-03-14 NOTE — Plan of Care (Signed)

## 2021-03-14 NOTE — Discharge Summary (Signed)
Physician Discharge Summary  Kathleen [name] YOV:[id number] DOB: April 10, 1961 DOA: 03/12/2021  PCP: Angela Burke, NP  Admit date: 03/12/2021 Discharge date: 03/15/2021  Admitted From: Home  Disposition:  Home   Recommendations for Outpatient Follow-up and new medication changes:  Follow up with Lady Gary NP in 7 to 10 days.  Continue with outpatient HD   Home Health: na   Equipment/Devices: na    Discharge Condition: stable  CODE STATUS: full  Diet recommendation:  heart healthy   Brief/Interim Summary: Kathleen [name] was admitted to the hospital with the working diagnosis of acute hypoxemic respiratory failure due to volume overload in the setting of ESRD and missing outpatient dialysis.    60 yo female with the past medical history of ESRD, chronic anemia, osteoarthritis, cardiac arrest, vasculitis, SLE, COPD, gallstones, hypertension, migraine headaches, seizures, sleep apnea, tuberculosis, TIA, depression, who presented with dyspnea.   Patient reported productive cough with whitish sputum, no fevers no chills.  She does not recall when was last time she received hemodialysis.  On her initial physical examination her temperature was 97.8, heart rate 85, respiratory rate 20, blood pressure 172/96, oxygen saturation 70s and low 80s on nasal cannula 2 L/min.  She was ill-looking appearing, dyspneic, decreased air movement on lung examination with bilateral rhonchi and rales, heart S1-S2, present, rhythmic, abdomen soft nontender, no significant lower extremity edema. Hemodialysis access left upper extremity AV graft.   Sodium 137, potassium 6.2, chloride 102, bicarb 23, glucose 89, BUN 68, creatinine 14.6, BNP 2335, high sensitive troponin 45, white count 6.3, hemoglobin 8.8, hematocrit 28.4, platelets 218. SARS COVID-19 negative.   Her chest radiograph had cardiomegaly, hilar vascular congestion, fluid in the right fissure.   EKG 84 bpm, normal axis, QTC 511, sinus rhythm with poor R  wave progression, no significant ST segment or T wave changes, positive LVH.   He developed rapid worsening respiratory distress, placed on noninvasive mechanical ventilation and underwent emergent hemodialysis.  Acute hypoxemic respiratory failure due to volume overload in the setting of ESRD, missing outpatient hemodialysis.  Hyperkalemia.  Patient had HD on 11/03 on admission, with improvement in hyperkalemia and hypervolemia.  Her oxygenation improved but continue to use supplemental 02 4 L per [place], to keep oxygen saturation 92% or greater.   Had HD today, with good toleration, 2,500 cc ultrafiltration with further improvement in volume status. Patient was weaned off supplemental oxygen with good toleration.    Anemia of chronic renal disease, hgb is 7,9 with hct at 26. Continue close follow up, EPO on HD    Metabolic bone disease continue with cinacalcet and sevalamer   2. Acute on chronic diastolic heart failure, HTN, prolonged Qtc.  Continue blood pressure control with amlodipine.    3. SLE stable with no signs of exacerbation.  Continue with hydroxychloroquine.    4. HTN. Continue blood pressure control with amlodipine.     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   Increase activity slowly   Complete by: As directed    No  wound care   Complete by: As directed       Allergies as of 03/14/2021   No Known Allergies      Medication List     TAKE these medications    acetaminophen 325 MG tablet Commonly known as: TYLENOL Take 650 mg by mouth every 6 (six) hours as needed for pain.   amLODipine 5 MG tablet Commonly known as: NORVASC Take 1 tablet (5 mg total) by mouth at bedtime. What changed:  when to take this reasons to take this   cinacalcet 60 MG tablet Commonly known as: SENSIPAR Take 60 mg by mouth 3 (three) times a week. Given at Dialysis on Tues, Thur, Sat   hydroxychloroquine 200 MG tablet Commonly known as: PLAQUENIL Take 200 mg by mouth daily.   sevelamer carbonate 800 MG tablet Commonly known as: RENVELA Take 1,600 mg by mouth 3 (three) times daily with meals.        No Known Allergies  Consultations: Nephrology    Procedures/Studies: DG Chest 2 View  Result Date: 03/12/2021 CLINICAL DATA:  shortness of breath EXAM: CHEST - 2 VIEW COMPARISON:  March 02, 2021. FINDINGS: Patchy interstitial airspace opacities in the right greater than left lungs. Interstitial opacities are slightly less conspicuous; however, right basilar airspace opacities are more conspicuous. Enlarged cardiac silhouette. Central pulmonary vascular congestion. IMPRESSION: 1. Patchy interstitial airspace opacities in the right greater than left lungs. Interstitial opacities are slightly less conspicuous; however, right basilar airspace opacities are more conspicuous. Findings could represent multifocal pneumonia or asymmetric edema. 2. Mild cardiomegaly and central pulmonary vascular congestion. Electronically Signed   By: Margaretha Sheffield M.D.   On: 03/12/2021 08:58   ECHOCARDIOGRAM COMPLETE  Result Date: 03/13/2021    ECHOCARDIOGRAM REPORT   Patient Name:   Kathleen [name] Date of Exam: 03/13/2021 Medical Rec #:  [id number]          Height:       64.0 in Accession #:    [id number]         Weight:        110.7 lb Date of Birth:  05-Apr-1961          BSA:          1.521 m Patient Age:    68 years           BP:           112/92 mmHg Patient Gender: F                  HR:           90 bpm. Exam Location:  Inpatient Procedure: 2D Echo, Cardiac Doppler and Color Doppler Indications:    CHF  History:        Patient has prior history of Echocardiogram examinations, most                 recent 10/10/2017. COPD and PAD, Signs/Symptoms:Syncope; Risk                 Factors:Hypertension and Current Smoker.  Sonographer:    Melissa Morford RDCS (AE, PE) Referring Phys: [id number] DAVID MANUEL [place]  1. Left ventricular ejection fraction, by estimation, is 45 to 50%.  The left ventricle has mildly decreased function. The left ventricle demonstrates global hypokinesis. Left ventricular diastolic parameters are indeterminate.  2. Right ventricular systolic function is normal. The right ventricular size is normal. Tricuspid regurgitation signal is inadequate for assessing PA pressure.  3. The mitral valve is grossly normal. Trivial mitral valve regurgitation. No evidence of mitral stenosis.  4. The aortic valve is tricuspid. There is mild calcification of the aortic valve. There is mild thickening of the aortic valve. Aortic valve regurgitation is moderate. Mild aortic valve sclerosis is present, with no evidence of aortic valve stenosis.  5. Aortic dilatation noted. There is moderate dilatation of the aortic root, measuring 41 mm.  6. The inferior vena cava is normal in size with greater than 50% respiratory variability, suggesting right atrial pressure of 3 mmHg. Comparison(s): Changes from prior study are noted. EF now 45-50%. FINDINGS  Left Ventricle: Left ventricular ejection fraction, by estimation, is 45 to 50%. The left ventricle has mildly decreased function. The left ventricle demonstrates global hypokinesis. Global longitudinal strain performed but not reported based on interpreter judgement due to suboptimal  tracking. The left ventricular internal cavity size was normal in size. There is no left ventricular hypertrophy. Left ventricular diastolic parameters are indeterminate. Right Ventricle: The right ventricular size is normal. No increase in right ventricular wall thickness. Right ventricular systolic function is normal. Tricuspid regurgitation signal is inadequate for assessing PA pressure. Left Atrium: Left atrial size was normal in size. Right Atrium: Right atrial size was normal in size. Pericardium: There is no evidence of pericardial effusion. Mitral Valve: The mitral valve is grossly normal. Trivial mitral valve regurgitation. No evidence of mitral valve stenosis. Tricuspid Valve: The tricuspid valve is grossly normal. Tricuspid valve regurgitation is trivial. No evidence of tricuspid stenosis. Aortic Valve: The aortic valve is tricuspid. There is mild calcification of the aortic valve. There is mild thickening of the aortic valve. Aortic valve regurgitation is moderate. Aortic regurgitation PHT measures 712 msec. Mild aortic valve sclerosis is  present, with no evidence of aortic valve stenosis. Pulmonic Valve: The pulmonic valve was grossly normal. Pulmonic valve regurgitation is not visualized. No evidence of pulmonic stenosis. Aorta: Aortic dilatation noted. There is moderate dilatation of the aortic root, measuring 41 mm. Venous: The inferior vena cava is normal in size with greater than 50% respiratory variability, suggesting right atrial pressure of 3 mmHg. IAS/Shunts: The atrial septum is grossly normal.  LEFT VENTRICLE PLAX 2D LVIDd:         4.40 cm      Diastology LVIDs:         3.40 cm      LV e' medial:    4.68 cm/s LV PW:         0.90 cm      LV E/e' medial:  20.9 LV IVS:        0.90 cm      LV e' lateral:   7.40 cm/s                             LV E/e' lateral: 13.2  LV Volumes (MOD) LV vol d, MOD A2C: 87.7 ml LV vol d, MOD A4C: 134.0 ml LV vol s, MOD A2C: 59.1 ml LV vol s, MOD A4C: 74.1 ml LV SV  MOD A2C:     28.6 ml LV SV MOD A4C:     134.0 ml LV SV MOD BP:  45.7 ml RIGHT VENTRICLE TAPSE (M-mode): 2.0 cm LEFT ATRIUM             Index        RIGHT ATRIUM           Index LA diam:        3.20 cm 2.10 cm/m   RA Area:     15.20 cm LA Vol (A2C):   55.5 ml 36.48 ml/m  RA Volume:   40.80 ml  26.82 ml/m LA Vol (A4C):   26.4 ml 17.35 ml/m LA Biplane Vol: 39.6 ml 26.03 ml/m  AORTIC VALVE LVOT Vmax:   103.00 cm/s LVOT Vmean:  70.100 cm/s LVOT VTI:    0.176 m AI PHT:      712 msec  AORTA Ao Root diam: 4.10 cm Ao Asc diam:  3.60 cm MITRAL VALVE MV Area (PHT): 5.58 cm     SHUNTS MV Decel Time: 136 msec     Systemic VTI: 0.18 m MV E velocity: 97.70 cm/s MV A velocity: 105.00 cm/s MV E/A ratio:  0.93 Kathleen Chiquito MD Electronically signed by Kathleen Chiquito MD Signature Date/Time: 03/13/2021/11:48:43 AM    Final      Subjective: Patient with no nausea or vomiting, had HD today with good toleration, dyspnea has improved but not yet back to baseline, continue using supplemental 02 per [place].   Discharge Exam: Vitals:   03/14/21 1927 03/15/21 0805  BP: (!) 141/93 (!) 148/94  Pulse: 94 84  Resp:  20  Temp: 98.8 F (37.1 C) 98.4 F (36.9 C)  SpO2: 100%    Vitals:   03/14/21 1148 03/14/21 1545 03/14/21 1927 03/15/21 0805  BP: 114/72 111/77 (!) 141/93 (!) 148/94  Pulse: 89 96 94 84  Resp: 19 17  20   Temp: 97.8 F (36.6 C) 97.6 F (36.4 C) 98.8 F (37.1 C) 98.4 F (36.9 C)  TempSrc: Oral Oral Oral Oral  SpO2: 98%  100%   Weight:      Height:        General: Not in pain or dyspnea. Deconditioned  Neurology: Awake and alert, non focal  E ENT: mild pallor, no icterus, oral mucosa moist Cardiovascular: No JVD. S1-S2 present, rhythmic, no gallops, rubs, or murmurs. No lower extremity edema. Pulmonary: positive breath sounds bilaterally, adequate air movement, no wheezing, rhonchi or rales. Gastrointestinal. Abdomen soft and non tender Skin. No rashes Musculoskeletal: no joint deformities      The results of significant diagnostics from this hospitalization (including imaging, microbiology, ancillary and laboratory) are listed below for reference.     Microbiology: Recent Results (from the past 240 hour(s))  Resp Panel by RT-PCR (Flu A&B, Covid) Nasopharyngeal Swab     Status: None   Collection Time: 03/12/21  9:54 AM   Specimen: Nasopharyngeal Swab; Nasopharyngeal(NP) swabs in vial transport medium  Result Value Ref Range Status   SARS Coronavirus 2 by RT PCR NEGATIVE NEGATIVE Final    Comment: (NOTE) SARS-CoV-2 target nucleic acids are NOT DETECTED.  The SARS-CoV-2 RNA is generally detectable in upper respiratory specimens during the acute phase of infection. The lowest concentration of SARS-CoV-2 viral copies this assay can detect is 138 copies/mL. A negative result does not preclude SARS-Cov-2 infection and should not be used as the sole basis for treatment or other patient management decisions. A negative result may occur with  improper specimen collection/handling, submission of specimen other than nasopharyngeal swab, presence of viral mutation(s) within the areas targeted by this assay, and  inadequate number of viral copies(<138 copies/mL). A negative result must be combined with clinical observations, patient history, and epidemiological information. The expected result is Negative.  Fact Sheet for Patients:  EntrepreneurPulse.com.au  Fact Sheet for Healthcare Providers:  IncredibleEmployment.be  This test is no t yet approved or cleared by the [place] FDA and  has been authorized for detection and/or diagnosis of SARS-CoV-2 by FDA under an Emergency Use Authorization (EUA). This EUA will remain  in effect (meaning this test can be used) for the duration of the COVID-19 declaration under Section 564(b)(1) of the Act, 21 U.S.C.section 360bbb-3(b)(1), unless the authorization is terminated  or revoked sooner.        Influenza A by PCR NEGATIVE NEGATIVE Final   Influenza B by PCR NEGATIVE NEGATIVE Final    Comment: (NOTE) The Xpert Xpress SARS-CoV-2/FLU/RSV plus assay is intended as an aid in the diagnosis of influenza from Nasopharyngeal swab specimens and should not be used as a sole basis for treatment. Nasal washings and aspirates are unacceptable for Xpert Xpress SARS-CoV-2/FLU/RSV testing.  Fact Sheet for Patients: EntrepreneurPulse.com.au  Fact Sheet for Healthcare Providers: IncredibleEmployment.be  This test is not yet approved or cleared by the [place] FDA and has been authorized for detection and/or diagnosis of SARS-CoV-2 by FDA under an Emergency Use Authorization (EUA). This EUA will remain in effect (meaning this test can be used) for the duration of the COVID-19 declaration under Section 564(b)(1) of the Act, 21 U.S.C. section 360bbb-3(b)(1), unless the authorization is terminated or revoked.  Performed at [facility], [place] [address] [place], [place] [address]      Labs: BNP (last 3 results) Recent Labs    03/12/21 0950  BNP 0,254.2*   Basic Metabolic Panel: Recent Labs  Lab 03/12/21 0950 03/12/21 0958 03/13/21 0227 03/14/21 0450 03/14/21 0812  NA 137 139 135 135 136  K 6.2* 6.3* 4.4 4.5 4.8  CL 102 105 96* 96* 96*  CO2 23  --  28 27 25   GLUCOSE 89 84 81 84 79  BUN 68* 64* 31* 50* 51*  CREATININE 14.66* 17.50* 8.92* 11.64* 11.66*  CALCIUM 8.4*  --  8.5* 8.3* 8.5*  PHOS  --   --   --   --  5.6*   Liver Function Tests: Recent Labs  Lab 03/12/21 0950 03/14/21 0812  AST 29  --   ALT 15  --   ALKPHOS 80  --   BILITOT 0.8  --   PROT 7.5  --   ALBUMIN 3.6 3.0*   No results for input(s): LIPASE, AMYLASE in the last 168 hours. No results for input(s): AMMONIA in the last 168 hours. CBC: Recent Labs  Lab 03/12/21 0950 03/12/21 0958 03/13/21 0227 03/14/21 0811  WBC 6.3  --  5.1  6.8  NEUTROABS 3.5  --   --   --   HGB 8.8* 9.9* 8.7* 7.9*  HCT 28.4* 29.0* 27.7* 26.0*  MCV 97.3  --  94.5 96.3  PLT 218  --  206 194   Cardiac Enzymes: No results for input(s): CKTOTAL, CKMB, CKMBINDEX, TROPONINI in the last 168 hours. BNP: Invalid input(s): POCBNP CBG: No results for input(s): GLUCAP in the last 168 hours. D-Dimer No results for input(s): DDIMER in the last 72 hours. Hgb A1c No results for input(s): HGBA1C in the last 72 hours. Lipid Profile No results for input(s): CHOL, HDL, LDLCALC, TRIG, CHOLHDL, LDLDIRECT in the last 72 hours. Thyroid function studies No results  for input(s): TSH, T4TOTAL, T3FREE, THYROIDAB in the last 72 hours.  Invalid input(s): FREET3 Anemia work up No results for input(s): VITAMINB12, FOLATE, FERRITIN, TIBC, IRON, RETICCTPCT in the last 72 hours. Urinalysis    Component Value Date/Time   COLORURINE STRAW (A) 06/29/2017 1428   APPEARANCEUR CLEAR 06/29/2017 1428   LABSPEC 1.009 06/29/2017 1428   PHURINE 6.0 06/29/2017 1428   GLUCOSEU NEGATIVE 06/29/2017 1428   HGBUR SMALL (A) 06/29/2017 1428   BILIRUBINUR NEGATIVE 06/29/2017 1428   KETONESUR NEGATIVE 06/29/2017 1428   PROTEINUR 100 (A) 06/29/2017 1428   NITRITE NEGATIVE 06/29/2017 1428   LEUKOCYTESUR NEGATIVE 06/29/2017 1428   Sepsis Labs Invalid input(s): PROCALCITONIN,  WBC,  LACTICIDVEN Microbiology Recent Results (from the past 240 hour(s))  Resp Panel by RT-PCR (Flu A&B, Covid) Nasopharyngeal Swab     Status: None   Collection Time: 03/12/21  9:54 AM   Specimen: Nasopharyngeal Swab; Nasopharyngeal(NP) swabs in vial transport medium  Result Value Ref Range Status   SARS Coronavirus 2 by RT PCR NEGATIVE NEGATIVE Final    Comment: (NOTE) SARS-CoV-2 target nucleic acids are NOT DETECTED.  The SARS-CoV-2 RNA is generally detectable in upper respiratory specimens during the acute phase of infection. The lowest concentration of SARS-CoV-2 viral copies this assay can  detect is 138 copies/mL. A negative result does not preclude SARS-Cov-2 infection and should not be used as the sole basis for treatment or other patient management decisions. A negative result may occur with  improper specimen collection/handling, submission of specimen other than nasopharyngeal swab, presence of viral mutation(s) within the areas targeted by this assay, and inadequate number of viral copies(<138 copies/mL). A negative result must be combined with clinical observations, patient history, and epidemiological information. The expected result is Negative.  Fact Sheet for Patients:  EntrepreneurPulse.com.au  Fact Sheet for Healthcare Providers:  IncredibleEmployment.be  This test is no t yet approved or cleared by the [place] FDA and  has been authorized for detection and/or diagnosis of SARS-CoV-2 by FDA under an Emergency Use Authorization (EUA). This EUA will remain  in effect (meaning this test can be used) for the duration of the COVID-19 declaration under Section 564(b)(1) of the Act, 21 U.S.C.section 360bbb-3(b)(1), unless the authorization is terminated  or revoked sooner.       Influenza A by PCR NEGATIVE NEGATIVE Final   Influenza B by PCR NEGATIVE NEGATIVE Final    Comment: (NOTE) The Xpert Xpress SARS-CoV-2/FLU/RSV plus assay is intended as an aid in the diagnosis of influenza from Nasopharyngeal swab specimens and should not be used as a sole basis for treatment. Nasal washings and aspirates are unacceptable for Xpert Xpress SARS-CoV-2/FLU/RSV testing.  Fact Sheet for Patients: EntrepreneurPulse.com.au  Fact Sheet for Healthcare Providers: IncredibleEmployment.be  This test is not yet approved or cleared by the [place] FDA and has been authorized for detection and/or diagnosis of SARS-CoV-2 by FDA under an Emergency Use Authorization (EUA). This EUA will remain in  effect (meaning this test can be used) for the duration of the COVID-19 declaration under Section 564(b)(1) of the Act, 21 U.S.C. section 360bbb-3(b)(1), unless the authorization is terminated or revoked.  Performed at [facility], [place] [address] [place], [place] [address]      Time coordinating discharge: 45 minutes  SIGNED:   Tawni Millers, MD  [facility] 03/14/2021, 6:02 PM

## 2021-03-14 NOTE — Progress Notes (Addendum)
[facility] Progress Note  Subjective: seen on HD, 4 L [place]  Vitals:   03/14/21 0900 03/14/21 0930 03/14/21 1000 03/14/21 1030  BP: 132/75 (!) 142/76 113/68 126/76  Pulse:      Resp: 17 19 16 16   Temp:      TempSrc:      SpO2:      Weight:      Height:        Exam:  alert, nad   no jvd  Chest scant rales at the bases  Cor reg no RG  Abd soft ntnd no ascites   Ext no LE edema   Alert, NF, ox3 LUA AVF +bruit      Home meds include - norvasc 5, sensipar, renvela, plaquenil, prns        [facility]: [name]    3h 74min   350/1.5   49.5kg   2/2 bath  LUA AVF  Hep 3600   - sensipar 60 tiw  - hect 6ug IV tiw  - mircera 75u q2, last 10/20     Assessment/ Plan: SOB/ resp distress - due to pulm edema in ESRD pt w/ missed HD. Improved w/ HD 11/3. HD today, large UF goal and try to wean her O2 post HD.  If off O2 and other issues stable possible dc later today.  ESRD- on HD TTS.  HD today.  HTN/ vol - up 2kg, UF goal 3 L today, as above Anemia ckd - Hb 9's, next esa due, ordered darbe 60 ug here MBD ckd - cont vdra, sensipar w/ hd H/o SLE H/o COPD     Kathleen [name] 03/14/2021, 10:43 AM   Recent Labs  Lab 03/13/21 0227 03/14/21 0450 03/14/21 0811 03/14/21 0812  K 4.4 4.5  --  4.8  BUN 31* 50*  --  51*  CREATININE 8.92* 11.64*  --  11.66*  CALCIUM 8.5* 8.3*  --  8.5*  PHOS  --   --   --  5.6*  HGB 8.7*  --  7.9*  --     Inpatient medications:  amLODipine  5 mg Oral QHS   Chlorhexidine Gluconate Cloth  6 each Topical Q0600   cinacalcet  60 mg Oral Q T,Th,Sa-HD   heparin  3,000 Units Dialysis Once in dialysis   [START ON 03/15/2021] heparin  3,500 Units Dialysis Once in dialysis   heparin  5,000 Units Subcutaneous Q8H   sevelamer carbonate  1,600 mg Oral TID WC    acetaminophen **OR** acetaminophen, ipratropium-albuterol, prochlorperazine

## 2021-03-14 NOTE — Plan of Care (Signed)

## 2021-03-15 ENCOUNTER — Inpatient Hospital Stay (HOSPITAL_COMMUNITY): Payer: Medicare Other

## 2021-03-15 NOTE — Progress Notes (Signed)
[facility] Progress Note  Subjective: seen in room, is off O2 and states she is going home  Vitals:   03/14/21 1148 03/14/21 1545 03/14/21 1927 03/15/21 0805  BP: 114/72 111/77 (!) 141/93 (!) 148/94  Pulse: 89 96 94 84  Resp: 19 17  20   Temp: 97.8 F (36.6 C) 97.6 F (36.4 C) 98.8 F (37.1 C) 98.4 F (36.9 C)  TempSrc: Oral Oral Oral Oral  SpO2: 98%  100%   Weight:      Height:        Exam:  alert, nad   no jvd  Chest clear bilat  Cor reg no RG  Abd soft ntnd no ascites   Ext no LE edema   Alert, NF, ox3 LUA AVF +bruit      Home meds include - norvasc 5, sensipar, renvela, plaquenil, prns        [name]    3h 14min   350/1.5   49.5kg   2/2 bath  LUA AVF  Hep 3600   - sensipar 60 tiw  - hect 6ug IV tiw  - mircera 75u q2, last 10/20     Assessment/ Plan: SOB/ resp distress - due to pulm edema in ESRD pt w/ missed HD. Improved w/ HD 11/3 and 11/5, wt's down to 48.5kg. Reset dry wt at dc 1 kg lower. Going home today.  ESRD- on HD TTS.  Had HD yesterday, 2.5 L off.  HTN/ vol - as above Anemia ckd - Hb 9's, next esa due, ordered darbe 60 ug here MBD ckd - cont vdra, sensipar w/ hd H/o SLE H/o COPD     Rob Ricahrd Schwager 03/15/2021, 10:18 AM   Recent Labs  Lab 03/13/21 0227 03/14/21 0450 03/14/21 0811 03/14/21 0812  K 4.4 4.5  --  4.8  BUN 31* 50*  --  51*  CREATININE 8.92* 11.64*  --  11.66*  CALCIUM 8.5* 8.3*  --  8.5*  PHOS  --   --   --  5.6*  HGB 8.7*  --  7.9*  --     Inpatient medications:  amLODipine  5 mg Oral QHS   Chlorhexidine Gluconate Cloth  6 each Topical Q0600   cinacalcet  60 mg Oral Q T,Th,Sa-HD   heparin  3,000 Units Dialysis Once in dialysis   heparin  5,000 Units Subcutaneous Q8H   sevelamer carbonate  1,600 mg Oral TID WC    acetaminophen **OR** acetaminophen, ipratropium-albuterol, prochlorperazine

## 2021-03-15 NOTE — TOC Transition Note (Signed)
Transition of Care [facility]) - CM/SW Discharge Note   Patient Details  Name: Kathleen [name] MRN: [id number] Date of Birth: 1960-10-29  Transition of Care [facility]) CM/SW Contact:  Carles Collet, RN Phone Number: 03/15/2021, 8:45 AM   Clinical Narrative:   Spoke w patient over the phone to discuss transportation barriers and missed HD. She states that she does not communicate and set up HD transport with [employer] in a timely manner and admitted responsibility for missing HD this last time stating she "needed to do better" with that. She also cites times in the past when [employer] did not stop and wait the 5 minutes they were suppose to at her door before leaving, just merely called her on the phone. She states that she needs to be a better advocate for herself in those situations. Offered [facility] resources and she declined stating that she wanted to continue using [employer], also discussed Medicaid transportation and she declined assistance with that as well.  Patient states that she has a friend who is going to provide transportation home today for discharge. No other CM needs identified.    Final next level of care: Home/Self Care Barriers to Discharge: No Barriers Identified   Patient Goals and CMS Choice Patient states their goals for this hospitalization and ongoing recovery are:: to go home   Choice offered to / list presented to : NA  Discharge Placement                       Discharge Plan and Services                                     Social Determinants of Health (SDOH) Interventions     Readmission Risk Interventions No flowsheet data found.

## 2021-03-16 ENCOUNTER — Telehealth: Payer: Self-pay | Admitting: Nurse Practitioner

## 2021-03-16 NOTE — Telephone Encounter (Signed)
Transition of care contact from inpatient facility  Date of Discharge: 03/15/2021 Date of Contact: 03/16/2021 Method of contact: Phone  Attempted to contact patient to discuss transition of care from inpatient admission. Patient did not answer the phone. Message was left on the patient's voicemail with call back number [phone].

## 2021-05-15 ENCOUNTER — Emergency Department (HOSPITAL_COMMUNITY): Payer: Medicare Other

## 2021-05-15 ENCOUNTER — Inpatient Hospital Stay (HOSPITAL_COMMUNITY)
Admission: EM | Admit: 2021-05-15 | Discharge: 2021-06-04 | DRG: 981 | Disposition: A | Discharge status: Home / Self Care | Payer: Medicare Other | Attending: Internal Medicine | Admitting: Internal Medicine

## 2021-05-15 DIAGNOSIS — M898X9 Other specified disorders of bone, unspecified site: Secondary | ICD-10-CM | POA: Diagnosis present

## 2021-05-15 DIAGNOSIS — E440 Moderate protein-calorie malnutrition: Secondary | ICD-10-CM | POA: Diagnosis present

## 2021-05-15 DIAGNOSIS — R54 Age-related physical debility: Secondary | ICD-10-CM | POA: Diagnosis present

## 2021-05-15 DIAGNOSIS — N19 Unspecified kidney failure: Secondary | ICD-10-CM | POA: Diagnosis present

## 2021-05-15 DIAGNOSIS — R609 Edema, unspecified: Secondary | ICD-10-CM

## 2021-05-15 DIAGNOSIS — I132 Hypertensive heart and chronic kidney disease with heart failure and with stage 5 chronic kidney disease, or end stage renal disease: Secondary | ICD-10-CM | POA: Diagnosis present

## 2021-05-15 DIAGNOSIS — M1480 Arthropathies in other specified diseases classified elsewhere, unspecified site: Secondary | ICD-10-CM | POA: Diagnosis present

## 2021-05-15 DIAGNOSIS — IMO0002 Reserved for concepts with insufficient information to code with codable children: Secondary | ICD-10-CM | POA: Diagnosis present

## 2021-05-15 DIAGNOSIS — M3214 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: Secondary | ICD-10-CM | POA: Diagnosis present

## 2021-05-15 DIAGNOSIS — U071 COVID-19: Principal | ICD-10-CM | POA: Diagnosis present

## 2021-05-15 DIAGNOSIS — I5043 Acute on chronic combined systolic (congestive) and diastolic (congestive) heart failure: Secondary | ICD-10-CM | POA: Diagnosis present

## 2021-05-15 DIAGNOSIS — R0602 Shortness of breath: Secondary | ICD-10-CM

## 2021-05-15 DIAGNOSIS — N2581 Secondary hyperparathyroidism of renal origin: Secondary | ICD-10-CM | POA: Diagnosis present

## 2021-05-15 DIAGNOSIS — J449 Chronic obstructive pulmonary disease, unspecified: Secondary | ICD-10-CM | POA: Diagnosis present

## 2021-05-15 DIAGNOSIS — I5042 Chronic combined systolic (congestive) and diastolic (congestive) heart failure: Secondary | ICD-10-CM | POA: Diagnosis present

## 2021-05-15 DIAGNOSIS — D62 Acute posthemorrhagic anemia: Secondary | ICD-10-CM | POA: Diagnosis present

## 2021-05-15 DIAGNOSIS — D631 Anemia in chronic kidney disease: Secondary | ICD-10-CM | POA: Diagnosis present

## 2021-05-15 DIAGNOSIS — K921 Melena: Secondary | ICD-10-CM | POA: Diagnosis not present

## 2021-05-15 DIAGNOSIS — Z79899 Other long term (current) drug therapy: Secondary | ICD-10-CM

## 2021-05-15 DIAGNOSIS — M329 Systemic lupus erythematosus, unspecified: Secondary | ICD-10-CM | POA: Diagnosis present

## 2021-05-15 DIAGNOSIS — R627 Adult failure to thrive: Secondary | ICD-10-CM | POA: Diagnosis not present

## 2021-05-15 DIAGNOSIS — Z8673 Personal history of transient ischemic attack (TIA), and cerebral infarction without residual deficits: Secondary | ICD-10-CM

## 2021-05-15 DIAGNOSIS — Z681 Body mass index (BMI) 19 or less, adult: Secondary | ICD-10-CM

## 2021-05-15 DIAGNOSIS — E876 Hypokalemia: Secondary | ICD-10-CM | POA: Diagnosis not present

## 2021-05-15 DIAGNOSIS — F17200 Nicotine dependence, unspecified, uncomplicated: Secondary | ICD-10-CM | POA: Diagnosis present

## 2021-05-15 DIAGNOSIS — M869 Osteomyelitis, unspecified: Secondary | ICD-10-CM | POA: Diagnosis not present

## 2021-05-15 DIAGNOSIS — I739 Peripheral vascular disease, unspecified: Secondary | ICD-10-CM | POA: Diagnosis present

## 2021-05-15 DIAGNOSIS — Z992 Dependence on renal dialysis: Secondary | ICD-10-CM

## 2021-05-15 DIAGNOSIS — B953 Streptococcus pneumoniae as the cause of diseases classified elsewhere: Secondary | ICD-10-CM | POA: Diagnosis not present

## 2021-05-15 DIAGNOSIS — G9341 Metabolic encephalopathy: Secondary | ICD-10-CM | POA: Diagnosis present

## 2021-05-15 DIAGNOSIS — M009 Pyogenic arthritis, unspecified: Secondary | ICD-10-CM | POA: Diagnosis not present

## 2021-05-15 DIAGNOSIS — N186 End stage renal disease: Secondary | ICD-10-CM | POA: Diagnosis present

## 2021-05-15 DIAGNOSIS — I10 Essential (primary) hypertension: Secondary | ICD-10-CM | POA: Diagnosis present

## 2021-05-15 DIAGNOSIS — R7881 Bacteremia: Secondary | ICD-10-CM | POA: Diagnosis not present

## 2021-05-15 DIAGNOSIS — R64 Cachexia: Secondary | ICD-10-CM | POA: Diagnosis present

## 2021-05-15 DIAGNOSIS — K625 Hemorrhage of anus and rectum: Secondary | ICD-10-CM | POA: Diagnosis present

## 2021-05-15 DIAGNOSIS — E875 Hyperkalemia: Secondary | ICD-10-CM | POA: Diagnosis not present

## 2021-05-15 DIAGNOSIS — M3219 Other organ or system involvement in systemic lupus erythematosus: Secondary | ICD-10-CM | POA: Diagnosis present

## 2021-05-15 DIAGNOSIS — F17210 Nicotine dependence, cigarettes, uncomplicated: Secondary | ICD-10-CM | POA: Diagnosis present

## 2021-05-15 DIAGNOSIS — G9349 Other encephalopathy: Secondary | ICD-10-CM | POA: Diagnosis not present

## 2021-05-15 DIAGNOSIS — R9431 Abnormal electrocardiogram [ECG] [EKG]: Secondary | ICD-10-CM | POA: Diagnosis present

## 2021-05-15 DIAGNOSIS — Z9115 Patient's noncompliance with renal dialysis: Secondary | ICD-10-CM

## 2021-05-15 DIAGNOSIS — Z8674 Personal history of sudden cardiac arrest: Secondary | ICD-10-CM

## 2021-05-15 LAB — CBC WITH DIFFERENTIAL/PLATELET
Abs Immature Granulocytes: 0.1 10*3/uL — ABNORMAL HIGH (ref 0.00–0.07)
Basophils Absolute: 0 10*3/uL (ref 0.0–0.1)
Basophils Relative: 0 %
Eosinophils Absolute: 0 10*3/uL (ref 0.0–0.5)
Eosinophils Relative: 0 %
HCT: 50.4 % — ABNORMAL HIGH (ref 36.0–46.0)
Hemoglobin: 15.7 g/dL — ABNORMAL HIGH (ref 12.0–15.0)
Immature Granulocytes: 1 %
Lymphocytes Relative: 6 %
Lymphs Abs: 0.9 10*3/uL (ref 0.7–4.0)
MCH: 28.9 pg (ref 26.0–34.0)
MCHC: 31.2 g/dL (ref 30.0–36.0)
MCV: 92.8 fL (ref 80.0–100.0)
Monocytes Absolute: 0.9 10*3/uL (ref 0.1–1.0)
Monocytes Relative: 6 %
Neutro Abs: 11.7 10*3/uL — ABNORMAL HIGH (ref 1.7–7.7)
Neutrophils Relative %: 87 %
Platelets: 150 10*3/uL (ref 150–400)
RBC: 5.43 MIL/uL — ABNORMAL HIGH (ref 3.87–5.11)
RDW: 17.3 % — ABNORMAL HIGH (ref 11.5–15.5)
WBC: 13.6 10*3/uL — ABNORMAL HIGH (ref 4.0–10.5)
nRBC: 0 % (ref 0.0–0.2)

## 2021-05-15 LAB — COMPREHENSIVE METABOLIC PANEL
ALT: 12 U/L (ref 0–44)
AST: 43 U/L — ABNORMAL HIGH (ref 15–41)
Albumin: 4 g/dL (ref 3.5–5.0)
Alkaline Phosphatase: 66 U/L (ref 38–126)
Anion gap: 23 — ABNORMAL HIGH (ref 5–15)
BUN: 65 mg/dL — ABNORMAL HIGH (ref 6–20)
CO2: 19 mmol/L — ABNORMAL LOW (ref 22–32)
Calcium: 8.9 mg/dL (ref 8.9–10.3)
Chloride: 93 mmol/L — ABNORMAL LOW (ref 98–111)
Creatinine, Ser: 17.96 mg/dL — ABNORMAL HIGH (ref 0.44–1.00)
GFR, Estimated: 2 mL/min — ABNORMAL LOW (ref >60)
Glucose, Bld: 115 mg/dL — ABNORMAL HIGH (ref 70–99)
Potassium: 3.6 mmol/L (ref 3.5–5.1)
Sodium: 135 mmol/L (ref 135–145)
Total Bilirubin: 0.7 mg/dL (ref 0.3–1.2)
Total Protein: 9.1 g/dL — ABNORMAL HIGH (ref 6.5–8.1)

## 2021-05-15 LAB — CBC
HCT: 47.7 % — ABNORMAL HIGH (ref 36.0–46.0)
Hemoglobin: 14.9 g/dL (ref 12.0–15.0)
MCH: 28.9 pg (ref 26.0–34.0)
MCHC: 31.2 g/dL (ref 30.0–36.0)
MCV: 92.4 fL (ref 80.0–100.0)
Platelets: 124 10*3/uL — ABNORMAL LOW (ref 150–400)
RBC: 5.16 MIL/uL — ABNORMAL HIGH (ref 3.87–5.11)
RDW: 17.5 % — ABNORMAL HIGH (ref 11.5–15.5)
WBC: 13.2 10*3/uL — ABNORMAL HIGH (ref 4.0–10.5)
nRBC: 0 % (ref 0.0–0.2)

## 2021-05-15 LAB — URINALYSIS, ROUTINE W REFLEX MICROSCOPIC
Bilirubin Urine: NEGATIVE
Glucose, UA: NEGATIVE mg/dL
Hgb urine dipstick: NEGATIVE
Ketones, ur: NEGATIVE mg/dL
Leukocytes,Ua: NEGATIVE
Nitrite: NEGATIVE
Protein, ur: NEGATIVE mg/dL
Specific Gravity, Urine: 1.02 (ref 1.005–1.030)
pH: 6 (ref 5.0–8.0)

## 2021-05-15 LAB — RESP PANEL BY RT-PCR (FLU A&B, COVID) ARPGX2
Influenza A by PCR: NEGATIVE
Influenza B by PCR: NEGATIVE
SARS Coronavirus 2 by RT PCR: POSITIVE — AB

## 2021-05-15 LAB — MAGNESIUM: Magnesium: 2.9 mg/dL — ABNORMAL HIGH (ref 1.7–2.4)

## 2021-05-15 LAB — TSH: TSH: 2.299 u[IU]/mL (ref 0.350–4.500)

## 2021-05-15 LAB — AMMONIA: Ammonia: 28 umol/L (ref 9–35)

## 2021-05-15 MED ORDER — SODIUM CHLORIDE 0.9% FLUSH
3.0000 mL | Freq: Two times a day (BID) | INTRAVENOUS | Status: DC
  Administered 2021-05-15 – 2021-06-03 (×26): 3 mL via INTRAVENOUS

## 2021-05-15 MED ORDER — DOXERCALCIFEROL 4 MCG/2ML IV SOLN
6.0000 ug | INTRAVENOUS | Status: DC
  Administered 2021-05-19: 6 ug via INTRAVENOUS
  Filled 2021-05-15 (×2): qty 4

## 2021-05-15 MED ORDER — SODIUM CHLORIDE 0.9% FLUSH: 3.0000 mL | INTRAVENOUS | Status: DC | PRN

## 2021-05-15 MED ORDER — MORPHINE SULFATE (PF) 2 MG/ML IV SOLN: 2.0000 mg | Freq: Once | INTRAVENOUS | Status: DC

## 2021-05-15 MED ORDER — ACETAMINOPHEN 650 MG RE SUPP: 650.0000 mg | Freq: Four times a day (QID) | RECTAL | Status: DC | PRN

## 2021-05-15 MED ORDER — OXYCODONE HCL 5 MG PO TABS
5.0000 mg | ORAL_TABLET | ORAL | Status: DC | PRN
  Administered 2021-05-16 – 2021-05-24 (×17): 5 mg via ORAL
  Filled 2021-05-15 (×19): qty 1

## 2021-05-15 MED ORDER — SEVELAMER CARBONATE 800 MG PO TABS
1600.0000 mg | ORAL_TABLET | Freq: Three times a day (TID) | ORAL | Status: DC
  Administered 2021-05-16 – 2021-05-25 (×23): 1600 mg via ORAL
  Filled 2021-05-15 (×24): qty 2

## 2021-05-15 MED ORDER — ACETAMINOPHEN 325 MG PO TABS
650.0000 mg | ORAL_TABLET | Freq: Four times a day (QID) | ORAL | Status: DC | PRN
  Administered 2021-05-19 – 2021-05-20 (×2): 650 mg via ORAL
  Filled 2021-05-15 (×3): qty 2

## 2021-05-15 MED ORDER — CINACALCET HCL 30 MG PO TABS
60.0000 mg | ORAL_TABLET | ORAL | Status: DC
  Administered 2021-05-18 – 2021-05-22 (×3): 60 mg via ORAL
  Filled 2021-05-15 (×3): qty 2

## 2021-05-15 MED ORDER — SODIUM CHLORIDE 0.9 % IV SOLN: 250.0000 mL | INTRAVENOUS | Status: DC | PRN

## 2021-05-15 MED ORDER — CINACALCET HCL 30 MG PO TABS
60.0000 mg | ORAL_TABLET | ORAL | Status: DC
  Administered 2021-05-16 – 2021-06-04 (×6): 60 mg via ORAL
  Filled 2021-05-15 (×13): qty 2

## 2021-05-15 MED ORDER — SODIUM CHLORIDE 0.9 % IV BOLUS
250.0000 mL | Freq: Once | INTRAVENOUS | Status: AC
  Administered 2021-05-15: 250 mL via INTRAVENOUS

## 2021-05-15 MED ORDER — CHLORHEXIDINE GLUCONATE CLOTH 2 % EX PADS
6.0000 | MEDICATED_PAD | Freq: Every day | CUTANEOUS | Status: DC
  Administered 2021-05-17 – 2021-06-04 (×19): 6 via TOPICAL

## 2021-05-15 MED ORDER — NICOTINE 14 MG/24HR TD PT24
14.0000 mg | MEDICATED_PATCH | Freq: Every day | TRANSDERMAL | Status: DC
  Administered 2021-05-15 – 2021-06-04 (×20): 14 mg via TRANSDERMAL
  Filled 2021-05-15 (×20): qty 1

## 2021-05-15 MED ORDER — AMLODIPINE BESYLATE 5 MG PO TABS: 5.0000 mg | ORAL_TABLET | Freq: Every evening | ORAL | Status: DC | PRN

## 2021-05-15 NOTE — Assessment & Plan Note (Signed)
Non compliant with dialysis for at least 3 sessions No emergent need for dialysis tonight Nephrology consulted with plans for dialysis tomorrow

## 2021-05-15 NOTE — Assessment & Plan Note (Signed)
-  Nicotine patch 

## 2021-05-15 NOTE — ED Triage Notes (Signed)
Pt bib [facility] from home C/O rectal bleeding for over 5 days.Pt described it as bright red blood. Dialysis sessions Tuesdays, thursdays and saturdays. Missed 2 sessions this week.   248ml NS given by EMS.   BP 132/78  87 HR 116 CBG 98% RA

## 2021-05-15 NOTE — ED Provider Notes (Signed)
[facility] EMERGENCY DEPARTMENT Provider Note   CSN: [id number] Arrival date & time: 05/15/21  1510     History  Chief Complaint  Patient presents with   Rectal Bleeding   missed dialysis     Kathleen [name] is a 61 y.o. female with a past medical history significant for end-stage renal disease on dialysis, hypertension, lupus, congestive heart failure who presents via EMS with complaints of rectal bleeding over the last 3 to 5 days, weakness, fatigue, missed dialysis for 1 week.  Patient reports bright red blood per rectum, reports she has not experienced has been packed.  She does report that she is having some abdominal pain without nausea.  Patient reports that she could not make it to dialysis because she felt too weak to beginning of the week.  Patient denies any fever, chills, chest pain.  She does feel somewhat short of breath. Patient denies pain with bowel movements, blood in underwear between bowel movements, clots in toilet.   Rectal Bleeding     Home Medications Prior to Admission medications   Medication Sig Start Date End Date Taking? Authorizing Provider  acetaminophen (TYLENOL) 325 MG tablet Take 650 mg by mouth every 6 (six) hours as needed for pain.    [provider]  amLODipine (NORVASC) 5 MG tablet Take 1 tablet (5 mg total) by mouth at bedtime. Patient taking differently: Take 5 mg by mouth at bedtime as needed (high blood pressue). 05/31/18   Oswald Hillock, MD  cinacalcet (SENSIPAR) 60 MG tablet Take 60 mg by mouth 3 (three) times a week. Given at Dialysis on Tues, Thur, Sat    [provider]  hydroxychloroquine (PLAQUENIL) 200 MG tablet Take 200 mg by mouth daily.  Patient not taking: Reported on 03/12/2021 06/02/18   [provider]  sevelamer carbonate (RENVELA) 800 MG tablet Take 1,600 mg by mouth 3 (three) times daily with meals. 09/21/18   [provider]      Allergies    Patient has no known  allergies.    Review of Systems   Review of Systems  Constitutional:  Positive for fatigue.  Respiratory:  Positive for shortness of breath.   Cardiovascular:  Negative for chest pain.  Gastrointestinal:  Positive for hematochezia.  Neurological:  Positive for weakness.  All other systems reviewed and are negative.  Physical Exam Updated Vital Signs There were no vitals taken for this visit. Physical Exam Vitals and nursing note reviewed.  Constitutional:      General: She is not in acute distress.    Appearance: Normal appearance. She is not ill-appearing.     Comments: Thin, ill-appearing, cachectic individual laying on bed in minimal distress  HENT:     Head: Normocephalic and atraumatic.  Eyes:     General: Scleral icterus present.        Right eye: No discharge.        Left eye: No discharge.  Cardiovascular:     Rate and Rhythm: Normal rate and regular rhythm.     Heart sounds: No murmur heard.   No friction rub. No gallop.  Pulmonary:     Effort: Pulmonary effort is normal.     Breath sounds: Normal breath sounds.     Comments: Accessory breath sounds noted, somewhat minimal lung excursion.  No focal consolidation noted. Abdominal:     General: Bowel sounds are normal.     Palpations: Abdomen is soft.     Comments: Significant  tenderness to palpation generally across the abdomen, without any rebound, rigidity, guarding.  There is no 1 area that is focally more tender than the rest.  There is no evidence of ecchymosis or hemorrhage from the abdominal surface.  Genitourinary:    Comments: Patient with some skin breakdown, raw red skin around the rectum. Rectal vault is somewhat loose, I do not palpate any thrombosed hemorrhoids. There is minimal light brown stool and bright red blood in rectal vault. Skin:    General: Skin is warm and dry.     Capillary Refill: Capillary refill takes less than 2 seconds.  Neurological:     Mental Status: She is alert and oriented to  person, place, and time.  Psychiatric:        Mood and Affect: Mood normal.        Behavior: Behavior normal.    ED Results / Procedures / Treatments   Labs (all labs ordered are listed, but only abnormal results are displayed) Labs Reviewed  RESP PANEL BY RT-PCR (FLU A&B, COVID) ARPGX2  CBC WITH DIFFERENTIAL/PLATELET  COMPREHENSIVE METABOLIC PANEL  AMMONIA  MAGNESIUM    EKG None  Radiology No results found.  Procedures Procedures    Medications Ordered in ED Medications  morphine 2 MG/ML injection 2 mg (has no administration in time range)    ED Course/ Medical Decision Making/ A&P                           Medical Decision Making I discussed this case with my attending physician who cosigned this note including patient's presenting symptoms, physical exam, and planned diagnostics and interventions. Attending physician stated agreement with plan or made changes to plan which were implemented.   Attending physician assessed patient at bedside.  This is a somewhat ill-appearing patient who presents with 2 primary concerns.  First bright red blood per rectum, and second generalized fatigue, weakness after missed dialysis.  Patient reports that the rectal bleeding started suddenly around 5 to 7 days ago, is not associated with pain.  She has not have any melena, or dark tarry stools.  She has no history of GI bleeds in the past, she is not taking a blood thinner.  My physical exam is significant for a thin, cachectic, ill-appearing patient with scleral icterus.  She does have some generalized severe tenderness palpation the abdomen.  Will obtain CT abdomen pelvis to further evaluate.  Exam of the rectum reveals skin breakdown in the area surrounding the rectum, this is consistent with trauma secondary to heart weightbearing, versus poor self-care.  There is no evidence of secondary infection or decubitus ulcer.  I ordered lab work and imagine, and personally reviewed it  including CBC, CMP, RVP, ammonia, magnesium.  Results are significant for elevated white blood cell count of 13.6, normal to elevated hemoglobin of 15.7.  Normal ammonia.  Chest x-ray does not reveal any acute intrathoracic process, CT abdomen pelvis does not reveal any acute intra-abdominal process.  Her EKG is significant for prolonged QT, as well as biatrial enlargement.  Dr. Ronnald Nian independently reviewed these findings, I agree with his interpretation.  Patient with significantly elevated creatinine of 17.96, BUN of 65, anion gap 23.  Given that she is having some significant fatigue, weakness, confusion, I believe that her anion gap is secondary to uremia.  She is moderately acidotic with a CO2 of 19.  Her magnesium is slightly elevated at 2.9.  Suspect that her  weakness, confusion, pain is secondary to overwhelming uremia after missing dialysis for over a week, will consult nephrology at this time to discuss dialysis, and need for admission versus dialysis x1 followed by dispo.  Suspect that patient will need admission given overwhelming uremia, and potential need for GI work-up in context of rectal bleeding, although I have minimal suspicion for actively ongoing rectal bleeding as there is no evidence of active bleeding on my exam, and patient has stable hemoglobin at this time, as well as no evidence of melena.  Consulted nephrology who agrees patient needs non-emergent dialysis, we will admit for monitoring and dialysis at this time. Consult placed for admission.  Spoke with the family medicine service who agrees to admission at this time.  Patient informed. Final Clinical Impression(s) / ED Diagnoses Final diagnoses:  None    Rx / DC Orders ED Discharge Orders     None         Anselmo Pickler, PA-C 05/15/21 Gates, Pierre, DO 05/15/21 2319

## 2021-05-15 NOTE — Assessment & Plan Note (Addendum)
Elevated readings, dialysis tomorrow Continue norvasc

## 2021-05-15 NOTE — Assessment & Plan Note (Addendum)
Patient on bed pan with gross mucous-bloody material in pan.  Rectal exam with no obvious hemorrhoids, has some mild skin tears, but not actively bleeding hgb stable, but likely hemoconcentrated With weight loss, decreased PO intake, lower quadrant abdomianl pain and change in BM, concern for malignancy.  She can not recall her last colonoscopy, says years ago. No family hx of colon cancer Will check cbc q6 hours and put her on liquid diet Check TSH with change in BM Will need GI consult in AM

## 2021-05-15 NOTE — Assessment & Plan Note (Signed)
hgb likely hemoconcentrated. Was 7.9 2 months ago Continue to monitor

## 2021-05-15 NOTE — Assessment & Plan Note (Signed)
Not a new finding, was on her last ekg Avoid qt prolonging drugs Maximize electrolytes Telemetry

## 2021-05-15 NOTE — Assessment & Plan Note (Addendum)
61 year old female with poor PO intake and weight loss x 3 weeks and overall decline -has blood in stool and some concerns for colon cancer-consult GI in AM -very dry on exam with poor PO intake x 3 weeks: small 250cc bolus  -SW consult -PT consult for recommendations for placement -nutrition consult

## 2021-05-15 NOTE — Assessment & Plan Note (Signed)
Appears euvolemic-dry, very cachetic/FTT Last echo: 03/2021: EF 45-50% with mildly decreased LVF and global hypokinesis. Diastolic parameters indeterminate Intake/output and daily weights Volume control per dialysis

## 2021-05-15 NOTE — Assessment & Plan Note (Addendum)
She has not been on plaquenil for 6+ weeks. Tells me "the doctor" stopped it. From her chart, I wonder if due to noncompliance Will hold at this time until further information obtained.

## 2021-05-15 NOTE — H&P (Signed)
History and Physical    Kathleen [name] TGG:[id number] DOB: May 06, 1961 DOA: 05/15/2021  PCP: Angela Burke, NP Consultants:  nephrology: Dr. Manuella Ghazi Patient coming from:  Home - lives with her brother   Chief Complaint: rectal bleeding/FTT/missed dialysis   HPI: Kathleen [name] is a 61 y.o. female with medical history significant of ESRD with dialysis on T/Thur/Sat, combined systolic and diastolic CHF, SLE, anemia in CKD, HTN, who presented to ED with complaints of rectal bleeding x 3-5 days and overall decline over the past 3 weeks. She has had poor PO intake, no appetite, weakness and fatigue. She states about 3-5 days ago she had some diarrhea then had blood in her stool with mucous. She doesn't have blood in between BM, no clots. No pain with BM, denies any vaginal bleeding. She has tenderness in her lower abdomen that has been present for 3-5 days. No radiation. No N/V. She has had no fever/chills. Due not feeling well she has missed at least the past 3 dialysis sessions.   She denies any fever/chills, headaches, chest pain, has "fast heart rate" at times, some shortness of breath, but no cough. No leg swelling or rashes.   She thinks she had a colonoscopy "forever" ago and cant' recall if any abnormal findings. No history of colon cancer that she can recall. In hospital records EGD in 07/2016 only. Normal exam.   She does smoke 1/2 PPD.    ED Course: vitals: temperature: 97.2, bp: 148/100, HR: 94, RR: 17, oxygen: 94%RA Pertinent labs: wbc: 13.6, hgb: 15.7, BUN: 65, creatinine: 17.96, magnesium: 2.9,  CXR: no active disease.  CT abdo pelvis: no acute process  In ED: nephrology consulted and [facility] was asked to admit.   Review of Systems: As per HPI; otherwise review of systems reviewed and negative.   Ambulatory Status:  Ambulates without assistance   Past Medical History:  Diagnosis Date   Anemia    Arthritis    Cardiac arrest ([place])    Cellulitis    Cerebral vasculitis     COPD (chronic obstructive pulmonary disease) (HCC)    Elevated antinuclear antibody (ANA) level    Endometriosis    ESRD (end stage renal disease) on dialysis (HCC)    Gallstones    Hypertension    Lupus (HCC)    Migraine    PONV (postoperative nausea and vomiting)    Seizures ([place])    Sepsis ([place])    Sleep apnea    Suicide attempt ([place])    TB (tuberculosis)    Transient ischemic attack (TIA)    Vasculitis of skin     Past Surgical History:  Procedure Laterality Date   AV FISTULA PLACEMENT Left 07/04/2017   Procedure: ARTERIOVENOUS (AV) FISTULA CREATION;  Surgeon: Conrad Grubbs, MD;  Location: [place];  Service: Vascular;  Laterality: Left;   AV FISTULA PLACEMENT Left 11/27/2019   Procedure: INSERTION OF ARTERIOVENOUS (AV) GORE-TEX GRAFT LEFT UPPER ARM;  Surgeon: Angelia Mould, MD;  Location: [place];  Service: Vascular;  Laterality: Left;   AV FISTULA PLACEMENT Left 07/29/2020   Procedure: INSERTION OF LEFT UPPER ARM ARTERIOVENOUS (AV) GORE-TEX GRAFT;  Surgeon: Angelia Mould, MD;  Location: [facility] OR;  Service: Vascular;  Laterality: Left;   CESAREAN SECTION     ESOPHAGOGASTRODUODENOSCOPY N/A 07/15/2016   Procedure: ESOPHAGOGASTRODUODENOSCOPY (EGD);  Surgeon: Laurence Spates, MD;  Location: [facility] ENDOSCOPY;  Service: Endoscopy;  Laterality: N/A;   INSERTION OF DIALYSIS CATHETER Right 07/04/2017   Procedure: INSERTION  OF TUNNELED  DIALYSIS CATHETER - RIGHT INTERNAL JUGULAR PLACEMENT;  Surgeon: Conrad Gilbert, MD;  Location: [facility] OR;  Service: Vascular;  Laterality: Right;   LUNG BIOPSY     TUBAL LIGATION      Social History   Socioeconomic History   Marital status: Single    Spouse name: Not on file   Number of children: Not on file   Years of education: Not on file   Highest education level: Not on file  Occupational History   Not on file  Tobacco Use   Smoking status: Some Days    Packs/day: 0.25    Types: Cigarettes   Smokeless tobacco: Never  Vaping Use   Vaping  Use: Never used  Substance and Sexual Activity   Alcohol use: No   Drug use: No   Sexual activity: Not Currently    Birth control/protection: Post-menopausal  Other Topics Concern   Not on file  Social History Narrative   Not on file   Social Determinants of Health   Financial Resource Strain: Not on file  Food Insecurity: Not on file  Transportation Needs: Not on file  Physical Activity: Not on file  Stress: Not on file  Social Connections: Not on file  Intimate Partner Violence: Not on file    No Known Allergies  Family History  Problem Relation Age of Onset   Ovarian cancer Mother    Breast cancer Sister    Throat cancer Brother     Prior to Admission medications   Medication Sig Start Date End Date Taking? Authorizing Provider  acetaminophen (TYLENOL) 325 MG tablet Take 650 mg by mouth every 6 (six) hours as needed for pain.    [provider]  amLODipine (NORVASC) 5 MG tablet Take 1 tablet (5 mg total) by mouth at bedtime. Patient taking differently: Take 5 mg by mouth at bedtime as needed (high blood pressue). 05/31/18   Oswald Hillock, MD  cinacalcet (SENSIPAR) 60 MG tablet Take 60 mg by mouth 3 (three) times a week. Given at Dialysis on Tues, Thur, Sat    [provider]  hydroxychloroquine (PLAQUENIL) 200 MG tablet Take 200 mg by mouth daily.  Patient not taking: Reported on 03/12/2021 06/02/18   [provider]  sevelamer carbonate (RENVELA) 800 MG tablet Take 1,600 mg by mouth 3 (three) times daily with meals. 09/21/18   [provider]    Physical Exam: Vitals:   05/15/21 1528 05/15/21 1545 05/15/21 1715 05/15/21 1730  BP:  (!) 160/109 (!) 152/100 (!) 148/100  Pulse: 91   94  Resp:  15 19 17   Temp:      TempSrc:      SpO2:    94%     General:  Appears calm and comfortable and is in NAD. Sleeping on side and easily awoken.  Eyes:  PERRL, EOMI, normal lids, iris ENT:  grossly normal hearing, lips & tongue, dry mucous  membranes; appropriate dentition Neck:  no LAD, masses or thyromegaly; no carotid bruits Cardiovascular:  RRR, no m/r/g. No LE edema.  Respiratory:   CTA bilaterally with no wheezes/rales/rhonchi.  Normal respiratory effort. Abdomen:  soft, TTP in lower quadrants. No rebound or guarding. BS+.  Back:   normal alignment, no CVAT Skin:  no rash or induration seen on limited exam. Fistula in LU arm. +skin tenting  GU: no external or internal hemorrhoids appreciated. Mucous-blood tinged stool in pan. She has some superficial skin breakdown on gluteus.  Musculoskeletal:  grossly normal tone BUE/BLE, good ROM, no bony abnormality Lower extremity:  No LE edema.  Limited foot exam with no ulcerations.  2+ distal pulses. Psychiatric:  grossly normal mood and affect, speech fluent and appropriate, AOx3 Neurologic:  CN 2-12 grossly intact, moves all extremities in coordinated fashion, sensation intact    Radiological Exams on Admission: Independently reviewed - see discussion in A/P where applicable  CT ABDOMEN PELVIS WO CONTRAST  Result Date: 05/15/2021 CLINICAL DATA:  Abdominal pain EXAM: CT ABDOMEN AND PELVIS WITHOUT CONTRAST TECHNIQUE: Multidetector CT imaging of the abdomen and pelvis was performed following the standard protocol without IV contrast. COMPARISON:  CT abdomen and pelvis 03/12/2018 FINDINGS: Lower chest: Hyperinflated lungs with minor dependent subsegmental atelectatic changes. Hepatobiliary: Liver is normal in size and contour with no suspicious mass appreciated. Calcified gallstones visualized measuring up to 12 mm in size. No definite gallbladder wall thickening or pericholecystic edema appreciated. No biliary ductal dilatation identified. Pancreas: Unremarkable. No pancreatic ductal dilatation or surrounding inflammatory changes. Spleen: Normal in size without focal abnormality. Adrenals/Urinary Tract: No adrenal mass visualized. No nephrolithiasis or hydronephrosis identified  bilaterally. Urinary bladder is nondistended and not well evaluated. Stomach/Bowel: No bowel obstruction, free air or pneumatosis. No definite evidence of bowel edema. No evidence of acute appendicitis in the right lower quadrant. Vascular/Lymphatic: Severe atherosclerotic disease. No bulky lymphadenopathy identified. Reproductive: Uterus and bilateral adnexa are unremarkable. Other: No ascites. Musculoskeletal: No suspicious bony lesions identified. IMPRESSION: 1. No acute process identified. 2. Cholelithiasis. Electronically Signed   By: Ofilia Neas M.D.   On: 05/15/2021 16:10   DG Chest 2 View  Result Date: 05/15/2021 CLINICAL DATA:  Shortness of breath EXAM: CHEST - 2 VIEW COMPARISON:  Chest radiograph dated March 15, 2021 FINDINGS: The heart size and mediastinal contours are within normal limits. Aorta is tortuous with atherosclerotic calcifications. Both lungs are clear. The visualized skeletal structures are unremarkable. IMPRESSION: No active cardiopulmonary disease. Electronically Signed   By: Keane Police D.O.   On: 05/15/2021 16:19    EKG: Independently reviewed.  NSR with rate 90; nonspecific ST changes with no evidence of acute ischemia. Prolonged qt   Labs on Admission: I have personally reviewed the available labs and imaging studies at the time of the admission.  Pertinent labs:  wbc: 13.6  hgb: 15.7,  BUN: 65,  creatinine: 17.96,  magnesium: 2.9,    Assessment/Plan * FTT (failure to thrive) in adult- (present on admission) 61 year old female with poor PO intake and weight loss x 3 weeks and overall decline -has blood in stool and some concerns for colon cancer-consult GI in AM -small NS bolus of 250cc -SW consult -PT consult for recommendations for placement -nutrition consult   Rectal bleeding- (present on admission) Patient on bed pan with gross mucous-bloody material in pan.  Rectal exam with no obvious hemorrhoids, has some mild skin tears, but not actively  bleeding hgb stable, but likely hemoconcentrated With weight loss, decreased PO intake, lower quadrant abdomianl pain and change in BM, concern for malignancy. CT pelvis/abdomen with no acute finding  She can not recall her last colonoscopy, says years ago. No family hx of colon cancer Will check cbc q6 hours and put her on liquid diet Check TSH with change in BM Will need GI consult in AM    ESRD (end stage renal disease) ([place])- (present on admission) Non compliant with dialysis for at least 3 sessions No emergent need for dialysis tonight Giving 250cc bolus in setting  of dryness on exam and FTT, watch volume status closely  Nephrology consulted with plans for dialysis tomorrow   Prolonged QT interval- (present on admission) Not a new finding, was on her last ekg Avoid qt prolonging drugs Maximize electrolytes Telemetry   Chronic combined systolic and diastolic CHF (congestive heart failure) ([place])- (present on admission) Appears euvolemic-dry, very cachetic/FTT Last echo: 03/2021: EF 45-50% with mildly decreased LVF and global hypokinesis. Diastolic parameters indeterminate Intake/output and daily weights Volume control per dialysis  Due to FTT and decreased PO intake and dryness on exam giving very small bolus of 250cc  Lupus ([place])- (present on admission) She has not been on plaquenil for 6+ weeks. Tells me "the doctor" stopped it. From her chart, I wonder if due to noncompliance Will hold at this time until further information obtained.   Hypertension- (present on admission) Elevated readings, dialysis tomorrow Continue norvasc   Anemia in ESRD (end-stage renal disease) ([place])- (present on admission) hgb likely hemoconcentrated. Was 7.9 2 months ago Continue to monitor   Tobacco dependence- (present on admission) Nicotine patch     There is no height or weight on file to calculate BMI.    Level of care: Telemetry Medical DVT prophylaxis:  TED/SCDs Code Status:   Full - confirmed with patient Family Communication: None present; I attempted to call her brother, Leward Quan, with no answer Disposition Plan:  The patient is from: home  Anticipated d/c is to: per day team  Patient placed in observation as anticipate less than 2 midnight stay. Requires hospitalization for dialysis, FTT, rectal bleeding and is not safe to go home. Requires intervention, assessment and close monitoring and MDM with specialists.     Patient is currently: acutely ill Consults called: nephrology by edp  Admission status:  observation    Orma Flaming MD [facility]   How to contact the [facility] Attending or Consulting provider [place] or covering provider during after hours [place], for this patient?  Check the care team in [facility] and look for a) attending/consulting [facility] provider listed and b) the [facility] team listed Log into [email or web address] and use [place]'s universal password to access. If you do not have the password, please contact the hospital operator. Locate the [facility] provider you are looking for under [facility] and page to a number that you can be directly reached. If you still have difficulty reaching the provider, please page the [facility] (Director on Call) for the Hospitalists listed on amion for assistance.   05/15/2021, 8:03 PM

## 2021-05-15 NOTE — Assessment & Plan Note (Signed)
Patient alert and oriented on my exam.

## 2021-05-15 NOTE — Consult Note (Signed)
[facility] Renal Consultation Note  Requesting MD: Orma Flaming, MD Indication for Consultation:  ESRD  Chief complaint: weak  HPI: Kathleen [name] is a 61 y.o. female with a history of ESRD on HD, COPD, HTN, migraine, lupus, and hx of seizures who presented to the hospital with weakness. She states that she was too weak to go to her last dialysis treatment and the ER has observed that she's too weak to get off of the bed.  Nephrology is consulted for assistance with management of HD.  PMHx:   Past Medical History:  Diagnosis Date   Anemia    Arthritis    Cardiac arrest ([place])    Cellulitis    Cerebral vasculitis    COPD (chronic obstructive pulmonary disease) (HCC)    Elevated antinuclear antibody (ANA) level    Endometriosis    ESRD (end stage renal disease) on dialysis (HCC)    Gallstones    Hypertension    Lupus (HCC)    Migraine    PONV (postoperative nausea and vomiting)    Seizures ([place])    Sepsis ([place])    Sleep apnea    Suicide attempt ([place])    TB (tuberculosis)    Transient ischemic attack (TIA)    Vasculitis of skin     Past Surgical History:  Procedure Laterality Date   AV FISTULA PLACEMENT Left 07/04/2017   Procedure: ARTERIOVENOUS (AV) FISTULA CREATION;  Surgeon: Conrad Glendo, MD;  Location: [place];  Service: Vascular;  Laterality: Left;   AV FISTULA PLACEMENT Left 11/27/2019   Procedure: INSERTION OF ARTERIOVENOUS (AV) GORE-TEX GRAFT LEFT UPPER ARM;  Surgeon: Angelia Mould, MD;  Location: [place];  Service: Vascular;  Laterality: Left;   AV FISTULA PLACEMENT Left 07/29/2020   Procedure: INSERTION OF LEFT UPPER ARM ARTERIOVENOUS (AV) GORE-TEX GRAFT;  Surgeon: Angelia Mould, MD;  Location: [facility] OR;  Service: Vascular;  Laterality: Left;   CESAREAN SECTION     ESOPHAGOGASTRODUODENOSCOPY N/A 07/15/2016   Procedure: ESOPHAGOGASTRODUODENOSCOPY (EGD);  Surgeon: Laurence Spates, MD;  Location: [facility] ENDOSCOPY;  Service: Endoscopy;   Laterality: N/A;   INSERTION OF DIALYSIS CATHETER Right 07/04/2017   Procedure: INSERTION OF TUNNELED  DIALYSIS CATHETER - RIGHT INTERNAL JUGULAR PLACEMENT;  Surgeon: Conrad [place]ville, MD;  Location: [facility] OR;  Service: Vascular;  Laterality: Right;   LUNG BIOPSY     TUBAL LIGATION      Family Hx:  Family History  Problem Relation Age of Onset   Ovarian cancer Mother    Breast cancer Sister    Throat cancer Brother     Social History:  reports that she has been smoking cigarettes. She has been smoking an average of .25 packs per day. She has never used smokeless tobacco. She reports that she does not drink alcohol and does not use drugs.  Allergies: No Known Allergies  Medications: Prior to Admission medications   Medication Sig Start Date End Date Taking? Authorizing Provider  acetaminophen (TYLENOL) 325 MG tablet Take 650 mg by mouth every 6 (six) hours as needed for pain.    [provider]  amLODipine (NORVASC) 5 MG tablet Take 1 tablet (5 mg total) by mouth at bedtime. Patient taking differently: Take 5 mg by mouth at bedtime as needed (high blood pressue). 05/31/18   Oswald Hillock, MD  cinacalcet (SENSIPAR) 60 MG tablet Take 60 mg by mouth 3 (three) times a week. Given at Dialysis on Tues, Thur, Sat    [provider]  hydroxychloroquine (PLAQUENIL) 200 MG tablet Take 200 mg by mouth daily.  Patient not taking: Reported on 03/12/2021 06/02/18   [provider]  sevelamer carbonate (RENVELA) 800 MG tablet Take 1,600 mg by mouth 3 (three) times daily with meals. 09/21/18   [provider]    I have reviewed the patient's current and reported prior to admission medications.  Labs:  BMP Latest Ref Rng & Units 05/15/2021 03/14/2021 03/14/2021  Glucose 70 - 99 mg/dL 115(H) 79 84  BUN 6 - 20 mg/dL 65(H) 51(H) 50(H)  Creatinine 0.44 - 1.00 mg/dL 17.96(H) 11.66(H) 11.64(H)  Sodium 135 - 145 mmol/L 135 136 135  Potassium 3.5 - 5.1 mmol/L 3.6 4.8 4.5  Chloride  98 - 111 mmol/L 93(L) 96(L) 96(L)  CO2 22 - 32 mmol/L 19(L) 25 27  Calcium 8.9 - 10.3 mg/dL 8.9 8.5(L) 8.3(L)    ROS:  Pertinent items noted in HPI and remainder of comprehensive ROS otherwise negative.  Physical Exam: Vitals:   05/15/21 1715 05/15/21 1730  BP: (!) 152/100 (!) 148/100  Pulse:  94  Resp: 19 17  Temp:    SpO2:  94%     General: adult female in bed in NAD  HEENT: NCAT Eyes: EOMI sclera anicteric Neck: supple trachea midline Heart: S1S2 no rub Lungs: clear to auscultation; normal work of breathing on room air Abdomen: soft/nt/nd Extremities: no edema appreciated; no cyanosis or clubbing Skin: no rash on extremities exposed Neuro: tired but oriented x3 and follows commands; generally weak on gross exam Access LUE AVG bruit    Outpatient HD orders:  [place]  3 hours and 15 minutes  TTS BF 350 DF auto 1.5 2 k/ 2 Ca 160 dialyzer  AVG EDW 50.8 kg (last post weight on 05/12/21 was 48.2 kg) Meds: sensipar 60 mg three times a week, hectorol 6 mcg every tx, venofer 100 mg weekly, mircera 75 mcg every 2 weeks (last dose on 04/28/21 - charted as not given on 1/3 and note elevated Hb)    Assessment/Plan:  # Weakness - do not feel that dialysis is sole contributor here, BUN mildly elevated - may benefit from a mini-bolus if found to be orthostatic - no volume overload on exam and we are keeping even (not pulling fluid) at HD tomorrow - would work-up additional etiologies per primary team if persisent  # ESRD - HD tomorrow  - would change patient to 180 dialyzer outpatient and will make that change here  # GI bleed - per primary team   # Hx anemia 2/2 ESRD and now with acute blood loss  - Hb above goal - defer ESA (note recent dose was held)  # Secondary hyperparathyroidism  - continue hectorol and sensipar (note both are given at outpatient HD and should not appear on her outpatient med rec)  Claudia Desanctis 05/15/2021, 9:59 PM

## 2021-05-16 DIAGNOSIS — F17210 Nicotine dependence, cigarettes, uncomplicated: Secondary | ICD-10-CM | POA: Diagnosis present

## 2021-05-16 DIAGNOSIS — D62 Acute posthemorrhagic anemia: Secondary | ICD-10-CM | POA: Diagnosis present

## 2021-05-16 DIAGNOSIS — I351 Nonrheumatic aortic (valve) insufficiency: Secondary | ICD-10-CM | POA: Diagnosis not present

## 2021-05-16 DIAGNOSIS — U071 COVID-19: Secondary | ICD-10-CM | POA: Diagnosis not present

## 2021-05-16 DIAGNOSIS — M329 Systemic lupus erythematosus, unspecified: Secondary | ICD-10-CM | POA: Diagnosis present

## 2021-05-16 DIAGNOSIS — D631 Anemia in chronic kidney disease: Secondary | ICD-10-CM | POA: Diagnosis not present

## 2021-05-16 DIAGNOSIS — E440 Moderate protein-calorie malnutrition: Secondary | ICD-10-CM | POA: Diagnosis not present

## 2021-05-16 DIAGNOSIS — N2581 Secondary hyperparathyroidism of renal origin: Secondary | ICD-10-CM | POA: Diagnosis present

## 2021-05-16 DIAGNOSIS — K921 Melena: Secondary | ICD-10-CM | POA: Diagnosis present

## 2021-05-16 DIAGNOSIS — M3219 Other organ or system involvement in systemic lupus erythematosus: Secondary | ICD-10-CM | POA: Diagnosis not present

## 2021-05-16 DIAGNOSIS — R54 Age-related physical debility: Secondary | ICD-10-CM | POA: Diagnosis present

## 2021-05-16 DIAGNOSIS — N186 End stage renal disease: Secondary | ICD-10-CM | POA: Diagnosis not present

## 2021-05-16 DIAGNOSIS — Z992 Dependence on renal dialysis: Secondary | ICD-10-CM | POA: Diagnosis not present

## 2021-05-16 DIAGNOSIS — I5042 Chronic combined systolic (congestive) and diastolic (congestive) heart failure: Secondary | ICD-10-CM

## 2021-05-16 DIAGNOSIS — M869 Osteomyelitis, unspecified: Secondary | ICD-10-CM | POA: Diagnosis not present

## 2021-05-16 DIAGNOSIS — G9341 Metabolic encephalopathy: Secondary | ICD-10-CM | POA: Diagnosis not present

## 2021-05-16 DIAGNOSIS — R7881 Bacteremia: Secondary | ICD-10-CM | POA: Diagnosis not present

## 2021-05-16 DIAGNOSIS — G9349 Other encephalopathy: Secondary | ICD-10-CM | POA: Diagnosis not present

## 2021-05-16 DIAGNOSIS — N19 Unspecified kidney failure: Secondary | ICD-10-CM | POA: Diagnosis present

## 2021-05-16 DIAGNOSIS — Z681 Body mass index (BMI) 19 or less, adult: Secondary | ICD-10-CM | POA: Diagnosis not present

## 2021-05-16 DIAGNOSIS — J449 Chronic obstructive pulmonary disease, unspecified: Secondary | ICD-10-CM | POA: Diagnosis present

## 2021-05-16 DIAGNOSIS — M009 Pyogenic arthritis, unspecified: Secondary | ICD-10-CM | POA: Diagnosis not present

## 2021-05-16 DIAGNOSIS — R64 Cachexia: Secondary | ICD-10-CM | POA: Diagnosis not present

## 2021-05-16 DIAGNOSIS — R627 Adult failure to thrive: Secondary | ICD-10-CM | POA: Diagnosis present

## 2021-05-16 DIAGNOSIS — M3214 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: Secondary | ICD-10-CM | POA: Diagnosis not present

## 2021-05-16 DIAGNOSIS — I132 Hypertensive heart and chronic kidney disease with heart failure and with stage 5 chronic kidney disease, or end stage renal disease: Secondary | ICD-10-CM | POA: Diagnosis not present

## 2021-05-16 LAB — CBC
HCT: 49.3 % — ABNORMAL HIGH (ref 36.0–46.0)
HCT: 48.8 % — ABNORMAL HIGH (ref 36.0–46.0)
Hemoglobin: 15.8 g/dL — ABNORMAL HIGH (ref 12.0–15.0)
Hemoglobin: 14.9 g/dL (ref 12.0–15.0)
MCH: 29.5 pg (ref 26.0–34.0)
MCH: 29.2 pg (ref 26.0–34.0)
MCHC: 32 g/dL (ref 30.0–36.0)
MCHC: 30.5 g/dL (ref 30.0–36.0)
MCV: 92.1 fL (ref 80.0–100.0)
MCV: 95.5 fL (ref 80.0–100.0)
Platelets: 138 10*3/uL — ABNORMAL LOW (ref 150–400)
Platelets: 140 10*3/uL — ABNORMAL LOW (ref 150–400)
RBC: 5.35 MIL/uL — ABNORMAL HIGH (ref 3.87–5.11)
RBC: 5.11 MIL/uL (ref 3.87–5.11)
RDW: 17.6 % — ABNORMAL HIGH (ref 11.5–15.5)
RDW: 17.9 % — ABNORMAL HIGH (ref 11.5–15.5)
WBC: 14.4 10*3/uL — ABNORMAL HIGH (ref 4.0–10.5)
WBC: 9.5 10*3/uL (ref 4.0–10.5)
nRBC: 0 % (ref 0.0–0.2)
nRBC: 0 % (ref 0.0–0.2)

## 2021-05-16 LAB — BASIC METABOLIC PANEL
Anion gap: 28 — ABNORMAL HIGH (ref 5–15)
BUN: 76 mg/dL — ABNORMAL HIGH (ref 6–20)
CO2: 15 mmol/L — ABNORMAL LOW (ref 22–32)
Calcium: 8.7 mg/dL — ABNORMAL LOW (ref 8.9–10.3)
Chloride: 95 mmol/L — ABNORMAL LOW (ref 98–111)
Creatinine, Ser: 18.8 mg/dL — ABNORMAL HIGH (ref 0.44–1.00)
GFR, Estimated: 2 mL/min — ABNORMAL LOW (ref >60)
Glucose, Bld: 92 mg/dL (ref 70–99)
Potassium: 3.9 mmol/L (ref 3.5–5.1)
Sodium: 138 mmol/L (ref 135–145)

## 2021-05-16 LAB — HEPATITIS B SURFACE ANTIGEN: Hepatitis B Surface Ag: NONREACTIVE

## 2021-05-16 MED ORDER — LIDOCAINE HCL (PF) 1 % IJ SOLN: 5.0000 mL | INTRAMUSCULAR | Status: DC | PRN

## 2021-05-16 MED ORDER — HYDROCORTISONE ACETATE 25 MG RE SUPP
25.0000 mg | Freq: Two times a day (BID) | RECTAL | Status: DC
  Administered 2021-05-16 (×2): 25 mg via RECTAL
  Filled 2021-05-16 (×3): qty 1

## 2021-05-16 MED ORDER — SODIUM CHLORIDE 0.9 % IV SOLN
100.0000 mL | INTRAVENOUS | Status: DC | PRN
  Administered 2021-05-17 (×2): 100 mL via INTRAVENOUS

## 2021-05-16 MED ORDER — SODIUM CHLORIDE 0.9 % IV BOLUS
250.0000 mL | Freq: Once | INTRAVENOUS | Status: AC
Start: 2021-05-16 — End: 2021-05-16
  Administered 2021-05-16: 250 mL via INTRAVENOUS

## 2021-05-16 MED ORDER — SODIUM CHLORIDE 0.9 % IV SOLN
100.0000 mg | Freq: Every day | INTRAVENOUS | Status: AC
  Administered 2021-05-17 – 2021-05-18 (×2): 100 mg via INTRAVENOUS
  Filled 2021-05-16 (×2): qty 20

## 2021-05-16 MED ORDER — PENTAFLUOROPROP-TETRAFLUOROETH EX AERO
1.0000 "application " | INHALATION_SPRAY | CUTANEOUS | Status: DC | PRN
  Filled 2021-05-16: qty 30

## 2021-05-16 MED ORDER — LOPERAMIDE HCL 2 MG PO CAPS
2.0000 mg | ORAL_CAPSULE | ORAL | Status: DC | PRN
  Administered 2021-05-16 – 2021-05-17 (×3): 2 mg via ORAL
  Filled 2021-05-16 (×3): qty 1

## 2021-05-16 MED ORDER — HEPARIN SODIUM (PORCINE) 1000 UNIT/ML DIALYSIS
1000.0000 [IU] | INTRAMUSCULAR | Status: DC | PRN
  Filled 2021-05-16: qty 1

## 2021-05-16 MED ORDER — HYDROCORTISONE ACETATE 25 MG RE SUPP
25.0000 mg | Freq: Two times a day (BID) | RECTAL | Status: DC
  Filled 2021-05-16: qty 1

## 2021-05-16 MED ORDER — SODIUM CHLORIDE 0.9 % IV SOLN: 100.0000 mL | INTRAVENOUS | Status: DC | PRN

## 2021-05-16 MED ORDER — SODIUM CHLORIDE 0.9 % IV SOLN
200.0000 mg | Freq: Once | INTRAVENOUS | Status: AC
  Administered 2021-05-16: 200 mg via INTRAVENOUS
  Filled 2021-05-16: qty 40

## 2021-05-16 MED ORDER — ALTEPLASE 2 MG IJ SOLR: 2.0000 mg | Freq: Once | INTRAMUSCULAR | Status: DC | PRN

## 2021-05-16 MED ORDER — LIDOCAINE-PRILOCAINE 2.5-2.5 % EX CREA
1.0000 "application " | TOPICAL_CREAM | CUTANEOUS | Status: DC | PRN
  Filled 2021-05-16: qty 5

## 2021-05-16 NOTE — ED Notes (Signed)
Attempted report, no assigned staff for IP bed yet

## 2021-05-16 NOTE — ED Notes (Signed)
Pts brief changed and pericare provided

## 2021-05-16 NOTE — Progress Notes (Signed)
PROGRESS NOTE        PATIENT DETAILS Name: Kathleen [name] Age: 61 y.o. Sex: female Date of Birth: [birth year] Admit Date: 05/15/2021 Admitting Physician Orma Flaming, MD PJK:DTOIZ, Beverly Gust, NP  Brief Narrative: Patient is a 61 y.o. female with history of ESRD on HD TTS, HFrEF, SLE, anemia due to CKD, HTN-who presented with at least 1 week history of worsening weakness, intermittent diarrhea, hematochezia-due to weakness-she missed her last 3 HD sessions-she was found to have COVID-19 infection and subsequently admitted to the hospitalist service.  Subjective: Lying comfortably in bed-denies any chest pain or shortness of breath.  Per RN staff-now has trace hematochezia.  Some coughing continues.  Objective: Vitals: Blood pressure 123/86, pulse 97, temperature 97.6 F (36.4 C), temperature source Oral, resp. rate 19, SpO2 97 %.   Exam: Gen Exam:Alert awake-not in any distress.  Looks very frail and debilitated. HEENT:atraumatic, normocephalic Chest: B/L clear to auscultation anteriorly CVS:S1S2 regular Abdomen:soft non tender, non distended Extremities:no edema Neurology: Non focal-but has generalized weakness. Skin: no rash  Pertinent Labs/Radiology: Recent Labs  Lab 05/15/21 1549 05/15/21 2301 05/16/21 0150 05/16/21 0157  WBC 13.6*   < > 14.4*  --   HGB 15.7*   < > 15.8*  --   PLT 150   < > 138*  --   NA 135  --   --  138  K 3.6  --   --  3.9  CREATININE 17.96*  --   --  18.80*  AST 43*  --   --   --   ALT 12  --   --   --   ALKPHOS 66  --   --   --   BILITOT 0.7  --   --   --    < > = values in this interval not displayed.    Assessment/Plan: COVID-19 infection: Apparently had diarrhea last week-some cough-CXR without PNA-we will plan on Remdesivir x3 days given that she is at risk for severe disease.  She is vaccinated and boosted.  Diarrhea with intermittent hematochezia: Suspect some mucosal sloughing in the setting of COVID-19  related diarrhea.  Hemoglobin stable-no major abnormality seen on CT abdomen.  Per nursing staff-trace hematochezia seen earlier this morning-looks like hematochezia has improved already.  Suspect she needs symptomatic/supportive care management-since Hb stable-suspect endoscopic evaluation can be done in the outpatient setting.  Deconditioning/debility: Likely due to COVID-19 infection-and uremia from missed HD.  Hopefully will improve with Remdesivir and HD.  Await PT/OT eval.  ESRD: Nephrology following-we will defer hemodialysis to nephrology service.  Chronic systolic heart failure: Volume status is stable.  Watch closely.  SLE: Stable-apparently no longer on Plaquenil.  HTN: BP stable-continue Norvasc.  Prolonged QTC: Continue telemetry monitoring-repeat EKG today-monitor electrolytes closely.  Avoid QTC prolonging agents.  Tobacco abuse: Continue transdermal nicotine  BMI Estimated body mass index is 18.43 kg/m as calculated from the following:   Height as of 03/12/21: 5\' 4"  (1.626 m).   Weight as of 03/14/21: 48.7 kg.    Procedures: None Consults: None DVT Prophylaxis: Hold pharmacological prophylaxis for 24 hours to allow hematochezia to improve further-reassess on 1/8 Code Status:Full code  Family Communication: None at bedside  Time spent: 35 minutes-Greater than 50% of this time was spent in counseling, explanation of diagnosis, planning of further management, and coordination of care.  Disposition Plan: Status is: Observation  The patient will require care spanning > 2 midnights and should be moved to inpatient because: Severe debility-COVID-19 infection-starting IV Remdesivir given risk for severe disease-not stable for discharge-needs to complete Remdesivir course and needs PT/OT eval to determine safe disposition.  Diet: Diet Order             Diet clear liquid Room service appropriate? Yes; Fluid consistency: Thin  Diet effective now                      Antimicrobial agents: Anti-infectives (From admission, onward)    Start     Dose/Rate Route Frequency Ordered Stop   05/17/21 1000  remdesivir 100 mg in sodium chloride 0.9 % 100 mL IVPB       See Hyperspace for full Linked Orders Report.   100 mg 200 mL/hr over 30 Minutes Intravenous Daily 05/16/21 0847 05/21/21 0959   05/16/21 1000  remdesivir 200 mg in sodium chloride 0.9% 250 mL IVPB       See Hyperspace for full Linked Orders Report.   200 mg 580 mL/hr over 30 Minutes Intravenous Once 05/16/21 0847          MEDICATIONS: Scheduled Meds:  Chlorhexidine Gluconate Cloth  6 each Topical Q0600   [START ON 05/18/2021] cinacalcet  60 mg Oral Once per day on Mon Wed Fri   cinacalcet  60 mg Oral Q T,Th,Sa-HD   doxercalciferol  6 mcg Intravenous Q T,Th,Sa-HD   hydrocortisone  25 mg Rectal BID   nicotine  14 mg Transdermal Daily   sevelamer carbonate  1,600 mg Oral TID WC   sodium chloride flush  3 mL Intravenous Q12H   Continuous Infusions:  sodium chloride     sodium chloride     sodium chloride     remdesivir 200 mg in sodium chloride 0.9% 250 mL IVPB     Followed by   Derrill Memo ON 05/17/2021] remdesivir 100 mg in NS 100 mL     PRN Meds:.sodium chloride, sodium chloride, sodium chloride, acetaminophen **OR** acetaminophen, alteplase, amLODipine, heparin, lidocaine (PF), lidocaine-prilocaine, oxyCODONE, pentafluoroprop-tetrafluoroeth, sodium chloride flush   I have personally reviewed following labs and imaging studies  LABORATORY DATA: CBC: Recent Labs  Lab 05/15/21 1549 05/15/21 2301 05/16/21 0150  WBC 13.6* 13.2* 14.4*  NEUTROABS 11.7*  --   --   HGB 15.7* 14.9 15.8*  HCT 50.4* 47.7* 49.3*  MCV 92.8 92.4 92.1  PLT 150 124* 138*    Basic Metabolic Panel: Recent Labs  Lab 05/15/21 1549 05/16/21 0157  NA 135 138  K 3.6 3.9  CL 93* 95*  CO2 19* 15*  GLUCOSE 115* 92  BUN 65* 76*  CREATININE 17.96* 18.80*  CALCIUM 8.9 8.7*  MG 2.9*  --     GFR: CrCl  cannot be calculated (Unknown ideal weight.).  Liver Function Tests: Recent Labs  Lab 05/15/21 1549  AST 43*  ALT 12  ALKPHOS 66  BILITOT 0.7  PROT 9.1*  ALBUMIN 4.0   No results for input(s): LIPASE, AMYLASE in the last 168 hours. Recent Labs  Lab 05/15/21 1550  AMMONIA 28    Coagulation Profile: No results for input(s): INR, PROTIME in the last 168 hours.  Cardiac Enzymes: No results for input(s): CKTOTAL, CKMB, CKMBINDEX, TROPONINI in the last 168 hours.  BNP (last 3 results) No results for input(s): PROBNP in the last 8760 hours.  Lipid Profile: No results for input(s): CHOL, HDL, LDLCALC,  TRIG, CHOLHDL, LDLDIRECT in the last 72 hours.  Thyroid Function Tests: Recent Labs    05/15/21 2200  TSH 2.299    Anemia Panel: No results for input(s): VITAMINB12, FOLATE, FERRITIN, TIBC, IRON, RETICCTPCT in the last 72 hours.  Urine analysis:    Component Value Date/Time   COLORURINE YELLOW 05/15/2021 2252   APPEARANCEUR CLEAR 05/15/2021 2252   LABSPEC 1.020 05/15/2021 2252   PHURINE 6.0 05/15/2021 2252   GLUCOSEU NEGATIVE 05/15/2021 2252   HGBUR NEGATIVE 05/15/2021 2252   BILIRUBINUR NEGATIVE 05/15/2021 2252   KETONESUR NEGATIVE 05/15/2021 2252   PROTEINUR NEGATIVE 05/15/2021 2252   NITRITE NEGATIVE 05/15/2021 2252   LEUKOCYTESUR NEGATIVE 05/15/2021 2252    Sepsis Labs: Lactic Acid, Venous    Component Value Date/Time   LATICACIDVEN 1.40 07/11/2017 0545    MICROBIOLOGY: Recent Results (from the past 240 hour(s))  Resp Panel by RT-PCR (Flu A&B, Covid) Nasopharyngeal Swab     Status: Abnormal   Collection Time: 05/15/21  3:26 PM   Specimen: Nasopharyngeal Swab; Nasopharyngeal(NP) swabs in vial transport medium  Result Value Ref Range Status   SARS Coronavirus 2 by RT PCR POSITIVE (A) NEGATIVE Final    Comment: (NOTE) SARS-CoV-2 target nucleic acids are DETECTED.  The SARS-CoV-2 RNA is generally detectable in upper respiratory specimens during the  acute phase of infection. Positive results are indicative of the presence of the identified virus, but do not rule out bacterial infection or co-infection with other pathogens not detected by the test. Clinical correlation with patient history and other diagnostic information is necessary to determine patient infection status. The expected result is Negative.  Fact Sheet for Patients: EntrepreneurPulse.com.au  Fact Sheet for Healthcare Providers: IncredibleEmployment.be  This test is not yet approved or cleared by the [place] FDA and  has been authorized for detection and/or diagnosis of SARS-CoV-2 by FDA under an Emergency Use Authorization (EUA).  This EUA will remain in effect (meaning this test can be used) for the duration of  the COVID-19 declaration under Section 564(b)(1) of the A ct, 21 U.S.C. section 360bbb-3(b)(1), unless the authorization is terminated or revoked sooner.     Influenza A by PCR NEGATIVE NEGATIVE Final   Influenza B by PCR NEGATIVE NEGATIVE Final    Comment: (NOTE) The Xpert Xpress SARS-CoV-2/FLU/RSV plus assay is intended as an aid in the diagnosis of influenza from Nasopharyngeal swab specimens and should not be used as a sole basis for treatment. Nasal washings and aspirates are unacceptable for Xpert Xpress SARS-CoV-2/FLU/RSV testing.  Fact Sheet for Patients: EntrepreneurPulse.com.au  Fact Sheet for Healthcare Providers: IncredibleEmployment.be  This test is not yet approved or cleared by the [place] FDA and has been authorized for detection and/or diagnosis of SARS-CoV-2 by FDA under an Emergency Use Authorization (EUA). This EUA will remain in effect (meaning this test can be used) for the duration of the COVID-19 declaration under Section 564(b)(1) of the Act, 21 U.S.C. section 360bbb-3(b)(1), unless the authorization is terminated or revoked.  Performed at  [facility], [place] [address] [place], [place] [address]     RADIOLOGY STUDIES/RESULTS: CT ABDOMEN PELVIS WO CONTRAST  Result Date: 05/15/2021 CLINICAL DATA:  Abdominal pain EXAM: CT ABDOMEN AND PELVIS WITHOUT CONTRAST TECHNIQUE: Multidetector CT imaging of the abdomen and pelvis was performed following the standard protocol without IV contrast. COMPARISON:  CT abdomen and pelvis 03/12/2018 FINDINGS: Lower chest: Hyperinflated lungs with minor dependent subsegmental atelectatic changes. Hepatobiliary: Liver is normal in size and contour with  no suspicious mass appreciated. Calcified gallstones visualized measuring up to 12 mm in size. No definite gallbladder wall thickening or pericholecystic edema appreciated. No biliary ductal dilatation identified. Pancreas: Unremarkable. No pancreatic ductal dilatation or surrounding inflammatory changes. Spleen: Normal in size without focal abnormality. Adrenals/Urinary Tract: No adrenal mass visualized. No nephrolithiasis or hydronephrosis identified bilaterally. Urinary bladder is nondistended and not well evaluated. Stomach/Bowel: No bowel obstruction, free air or pneumatosis. No definite evidence of bowel edema. No evidence of acute appendicitis in the right lower quadrant. Vascular/Lymphatic: Severe atherosclerotic disease. No bulky lymphadenopathy identified. Reproductive: Uterus and bilateral adnexa are unremarkable. Other: No ascites. Musculoskeletal: No suspicious bony lesions identified. IMPRESSION: 1. No acute process identified. 2. Cholelithiasis. Electronically Signed   By: Ofilia Neas M.D.   On: 05/15/2021 16:10   DG Chest 2 View  Result Date: 05/15/2021 CLINICAL DATA:  Shortness of breath EXAM: CHEST - 2 VIEW COMPARISON:  Chest radiograph dated March 15, 2021 FINDINGS: The heart size and mediastinal contours are within normal limits. Aorta is tortuous with atherosclerotic calcifications. Both lungs are clear. The visualized skeletal  structures are unremarkable. IMPRESSION: No active cardiopulmonary disease. Electronically Signed   By: Keane Police D.O.   On: 05/15/2021 16:19     LOS: 0 days   Oren Binet, MD  [facility]    To contact the attending provider between 7A-7P or the covering provider during after hours 7P-7A, please log into the web site [email or web address] and access using universal [place] password for that web site. If you do not have the password, please call the hospital operator.  05/16/2021, 9:58 AM

## 2021-05-16 NOTE — ED Notes (Signed)
Pts brief changed  

## 2021-05-16 NOTE — Progress Notes (Signed)
[facility] Progress Note  Name: Kathleen [name] MRN: [id number] DOB: [birth year]  Chief Complaint:  Weakness   Subjective:  last HD outpatient on 1/3.  She was found to be covid positive and was admitted to [facility].  She feels very weak.    Review of systems:  Denies shortness of breath or chest pain Reports nausea and has spit up a small amount  Nursing staff reports multiple BM's since arrival  ------------- Background on consult:  Kathleen [name] is a 61 y.o. female with a history of ESRD on HD, COPD, HTN, migraine, lupus, and hx of seizures who presented to the hospital with weakness. She states that she was too weak to go to her last dialysis treatment and the ER has observed that she's too weak to get off of the bed.  Nephrology is consulted for assistance with management of HD.     Intake/Output Summary (Last 24 hours) at 05/16/2021 1541 Last data filed at 05/16/2021 1104 Gross per 24 hour  Intake 540 ml  Output --  Net 540 ml    Vitals:  Vitals:   05/16/21 1304 05/16/21 1400 05/16/21 1452 05/16/21 1534  BP: (!) 117/91 98/66  98/76  Pulse: (!) 112 (!) 108  (!) 106  Resp: 19 (!) 22  19  Temp:   97.6 F (36.4 C) 98.2 F (36.8 C)  TempSrc:    Oral  SpO2: 98% 98%  100%     Physical Exam:  General: adult female in bed in NAD  HEENT: NCAT Eyes: EOMI sclera anicteric Neck: supple trachea midline Heart: S1S2 no rub Lungs: normal work of breathing on room air Abdomen: soft/nt/nd Extremities: no edema appreciated; no cyanosis or clubbing Neuro: tired but oriented x3 and follows commands; generally weak on gross exam Access LUE AVG in place  Medications reviewed   Labs:  BMP Latest Ref Rng & Units 05/16/2021 05/15/2021 03/14/2021  Glucose 70 - 99 mg/dL 92 115(H) 79  BUN 6 - 20 mg/dL 76(H) 65(H) 51(H)  Creatinine 0.44 - 1.00 mg/dL 18.80(H) 17.96(H) 11.66(H)  Sodium 135 - 145 mmol/L 138 135 136  Potassium 3.5 - 5.1 mmol/L 3.9 3.6 4.8  Chloride 98 -  111 mmol/L 95(L) 93(L) 96(L)  CO2 22 - 32 mmol/L 15(L) 19(L) 25  Calcium 8.9 - 10.3 mg/dL 8.7(L) 8.9 8.5(L)   Outpatient HD orders:  [facility]  3 hours and 15 minutes  TTS BF 350 DF auto 1.5 2 k/ 2 Ca 160 dialyzer  AVG EDW 50.8 kg (last post weight on 05/12/21 was 48.2 kg) Meds: sensipar 60 mg three times a week, hectorol 6 mcg every tx, venofer 100 mg weekly, mircera 75 mcg every 2 weeks (last dose on 04/28/21 - charted as not given on 1/3 and note elevated Hb)  Assessment/Plan:   # Weakness - felt secondary to covid  - may benefit from a mini-bolus if found to be orthostatic - no volume overload on exam and we are keeping even (not pulling fluid) at HD tomorrow - NS once now 250 mL  # Covid PNA  - Therapies per primary team    # ESRD - HD today as staffing permits  - would change patient to 180 dialyzer outpatient and will make that change here   # GI bleed - per primary team  - normal Hb    # Hx anemia 2/2 ESRD and now with acute blood loss  - Hb above goal - defer ESA (note recent dose  was held as well)   # Secondary hyperparathyroidism  - continue hectorol and sensipar (note both are given at outpatient HD and should not appear on her outpatient med rec)  Claudia Desanctis, MD 05/16/2021 4:04 PM

## 2021-05-16 NOTE — Evaluation (Signed)
Physical Therapy Evaluation Patient Details Name: Kathleen [name] MRN: [id number] DOB: [birth year] Today's Date: 05/16/2021  History of Present Illness  Pt is a 61 y.o. female who presented 05/15/21 with weakness. Pt with GI bleed. work up underway, but current diagnosis:  FTT, chronic combined systolic/diastolic HF, COVID -56  PMH: ESRD on HD, COPD, HTN, migraine, lupus, seziures, anemia, cardiac arrest hx, cellulitis, cerebral vasculitis, hx of suicide attempt, TB, TIA   Clinical Impression  Pt presents with condition above and deficits mentioned below, see PT Problem List. PTA, she was independent without an AD for mobility with a hx of falls. She lives with her brother in a 1-bedroom apartment with 3 STE and she sleeps in a chair. Pt reports her brother has unclean tendencies and she is desiring to find a new home, but expresses concern over leaving him as he has cancer. She performs all the household chores and cooking. Currently, pt displays deficits in lower extremity strength, activity tolerance, balance, and a hx of neuropathy in her legs. She is at high risk for subsequent falls, requiring minA for all functional mobility today. She was only able to take a few steps before fatiguing and displayed bil knee instability. Pt would benefit from a short-term rehab stay at a SNF to maximize her independence and safety with all functional mobility as she has had a significant decline in functional status and endurance. Will continue to follow acutely.     Recommendations for follow up therapy are one component of a multi-disciplinary discharge planning process, led by the attending physician.  Recommendations may be updated based on patient status, additional functional criteria and insurance authorization.  Follow Up Recommendations Skilled nursing-short term rehab (<3 hours/day)    Assistance Recommended at Discharge Intermittent Supervision/Assistance  Patient can return home with the  following  A little help with walking and/or transfers;A little help with bathing/dressing/bathroom;Assistance with cooking/housework;Assist for transportation;Help with stairs or ramp for entrance    Equipment Recommendations Rolling walker (2 wheels);BSC/3in1  Recommendations for Other Services       Functional Status Assessment Patient has had a recent decline in their functional status and demonstrates the ability to make significant improvements in function in a reasonable and predictable amount of time.     Precautions / Restrictions Precautions Precautions: Fall;Other (comment) Precaution Comments: monitor vitals Restrictions Weight Bearing Restrictions: No      Mobility  Bed Mobility Overal bed mobility: Needs Assistance Bed Mobility: Supine to Sit;Sit to Supine     Supine to sit: Min assist;HOB elevated Sit to supine: Min assist   General bed mobility comments: Pt requesting hand to pull up on to ascend trunk to sit up R edge of stretcher, minA. MinA to manage legs back onto bed with return to supine.    Transfers Overall transfer level: Needs assistance Equipment used: Rolling walker (2 wheels) Transfers: Sit to/from Stand Sit to Stand: Min assist           General transfer comment: Extra time and minA to power up to stand and steady at RW from edge of stretcher.    Ambulation/Gait Ambulation/Gait assistance: Min assist Gait Distance (Feet): 6 Feet Assistive device: Rolling walker (2 wheels) Gait Pattern/deviations: Step-to pattern;Decreased stride length;Trunk flexed;Shuffle Gait velocity: reduced Gait velocity interpretation: <1.31 ft/sec, indicative of household ambulator   General Gait Details: Pt with slow, shuffling gait with noted bil knee instability, but no appreciative knee buckling. MinA for stability, taking a few steps anterior before fatiguing and stepping  back to stretcher.  Stairs            Wheelchair Mobility    Modified  Rankin (Stroke Patients Only)       Balance Overall balance assessment: Needs assistance Sitting-balance support: No upper extremity supported;Feet supported Sitting balance-Leahy Scale: Fair Sitting balance - Comments: Static sitting EOB with min guard for safety, body shaking due to being cold.   Standing balance support: Reliant on assistive device for balance Standing balance-Leahy Scale: Poor Standing balance comment: Reliant on RW and minA to stand, knee instability noted.                             Pertinent Vitals/Pain Pain Assessment: Faces Faces Pain Scale: Hurts little more Pain Location: abdomen Pain Descriptors / Indicators: Aching Pain Intervention(s): Limited activity within patient's tolerance;Monitored during session;Repositioned    Home Living Family/patient expects to be discharged to:: Private residence Living Arrangements: Other relatives (brother who has cancer) Available Help at Discharge: Family;Friend(s) (unclear amount of assistance available) Type of Home: Apartment Home Access: Stairs to enter Entrance Stairs-Rails: Right (ascending) Entrance Stairs-Number of Steps: 3   Home Layout: One level Home Equipment: Grab bars - tub/shower Additional Comments: Brother has habits that are not clean, like urinating in sink and not changing clothes for weeks. Pt desires to find another place to stay. Sleeps in chair.    Prior Function Prior Level of Function : Independent/Modified Independent;History of Falls (last six months);Driving             Mobility Comments: Walks without AD. Hx of falls. ADLs Comments: Does all the housekeeping and cooking for her brother.     Hand Dominance   Dominant Hand: Right    Extremity/Trunk Assessment   Upper Extremity Assessment Upper Extremity Assessment: Defer to OT evaluation    Lower Extremity Assessment Lower Extremity Assessment: RLE deficits/detail;LLE deficits/detail RLE Deficits /  Details: MMT scores of 4 hip flexion, 4+ knee extension, 4+ ankle dorsiflexion; hx of neuropathy RLE Sensation: history of peripheral neuropathy LLE Deficits / Details: MMT scores of 4- hip flexion, 4+ knee extension, 4+ ankle dorsiflexion; hx of neuropathy LLE Sensation: history of peripheral neuropathy    Cervical / Trunk Assessment Cervical / Trunk Assessment: Normal  Communication   Communication: No difficulties  Cognition Arousal/Alertness: Awake/alert Behavior During Therapy: WFL for tasks assessed/performed Overall Cognitive Status: Within Functional Limits for tasks assessed                                 General Comments: Tearful at times in regards to living situation with brother, denies any abuse. Difficult to keep on task/questions asked on times, but unsure whether pt just did not hear the PT or not        General Comments      Exercises     Assessment/Plan    PT Assessment Patient needs continued PT services  PT Problem List Decreased strength;Decreased activity tolerance;Decreased mobility;Decreased balance;Decreased coordination;Cardiopulmonary status limiting activity;Impaired sensation;Pain       PT Treatment Interventions DME instruction;Gait training;Stair training;Functional mobility training;Therapeutic activities;Therapeutic exercise;Balance training;Neuromuscular re-education;Patient/family education    PT Goals (Current goals can be found in the Care Plan section)  Acute Rehab PT Goals Patient Stated Goal: to find a new home PT Goal Formulation: With patient Time For Goal Achievement: 05/30/21 Potential to Achieve Goals: Good    Frequency  Min 2X/week     Co-evaluation               AM-PAC PT "6 Clicks" Mobility  Outcome Measure Help needed turning from your back to your side while in a flat bed without using bedrails?: A Little Help needed moving from lying on your back to sitting on the side of a flat bed without using  bedrails?: A Little Help needed moving to and from a bed to a chair (including a wheelchair)?: A Little Help needed standing up from a chair using your arms (e.g., wheelchair or bedside chair)?: A Little Help needed to walk in hospital room?: Total Help needed climbing 3-5 steps with a railing? : Total 6 Click Score: 14    End of Session   Activity Tolerance: Patient tolerated treatment well;Patient limited by fatigue Patient left: in bed;with call bell/phone within reach Nurse Communication: Mobility status PT Visit Diagnosis: Unsteadiness on feet (R26.81);Other abnormalities of gait and mobility (R26.89);Muscle weakness (generalized) (M62.81);History of falling (Z91.81);Difficulty in walking, not elsewhere classified (R26.2);Pain Pain - part of body:  (abdomen)    Time: 1020-1056 PT Time Calculation (min) (ACUTE ONLY): 36 min   Charges:   PT Evaluation $PT Eval Moderate Complexity: 1 Mod PT Treatments $Therapeutic Activity: 8-22 mins        Moishe Spice, PT, DPT Acute Rehabilitation Services  Pager: [phone] Office: [phone]   Orvan Falconer 05/16/2021, 11:03 AM

## 2021-05-16 NOTE — Evaluation (Signed)
Occupational Therapy Evaluation Patient Details Name: Kathleen [name] MRN: [id number] DOB: 02-23-1961 Today's Date: 05/16/2021   History of Present Illness Pt is a 61 y.o. female who presented 05/15/21 with weakness. Pt with GI bleed. work up underway, but current diagnosis:  FTT, chronic combined systolic/diastolic HF, COVID -53  PMH: ESRD on HD, COPD, HTN, migraine, lupus, seziures, anemia, cardiac arrest hx, cellulitis, cerebral vasculitis, hx of suicide attempt, TB, TIA   Clinical Impression   Patient admitted for the diagnosis above.  PTA she lives with her brother in a one level apartment.  She assists her brother with the majority of home management and meal prep.  She is able to perform her own self care, and walks without an AD.  Her home situation does not sound good for her, she would like to find an alternate living arrangement, but is fearful because her brother relies on her.  Deficits impacting independence are listed below.  Currently she is needing up to [place] for basic mobility, and up to [place] a for lower body ADL from a sit/stand level.  Given current level of assist needed, and no assist at home, OT recommends short term rehab at a local SNF.  OT to follow in the acute setting.       Recommendations for follow up therapy are one component of a multi-disciplinary discharge planning process, led by the attending physician.  Recommendations may be updated based on patient status, additional functional criteria and insurance authorization.   Follow Up Recommendations  Skilled nursing-short term rehab (<3 hours/day)    Assistance Recommended at Discharge Frequent or constant Supervision/Assistance  Patient can return home with the following A little help with bathing/dressing/bathroom;A little help with walking and/or transfers    Functional Status Assessment  Patient has had a recent decline in their functional status and demonstrates the ability to make significant improvements  in function in a reasonable and predictable amount of time.  Equipment Recommendations  None recommended by OT    Recommendations for Other Services       Precautions / Restrictions Precautions Precautions: Fall;Other (comment) Restrictions Weight Bearing Restrictions: No      Mobility Bed Mobility Overal bed mobility: Needs Assistance Bed Mobility: Supine to Sit;Sit to Supine     Supine to sit: Min assist;HOB elevated Sit to supine: Min assist   General bed mobility comments: able to use SR's with assist for legs.    Transfers Overall transfer level: Needs assistance Equipment used: Rolling walker (2 wheels) Transfers: Sit to/from Stand;Bed to chair/wheelchair/BSC Sit to Stand: Min assist     Step pivot transfers: Min assist            Balance Overall balance assessment: Needs assistance Sitting-balance support: No upper extremity supported;Feet supported Sitting balance-Leahy Scale: Fair     Standing balance support: Reliant on assistive device for balance Standing balance-Leahy Scale: Poor                             ADL either performed or assessed with clinical judgement   ADL Overall ADL's : Needs assistance/impaired Eating/Feeding: Set up;Sitting   Grooming: Wash/dry hands;Wash/dry face;Set up;Sitting   Upper Body Bathing: Set up;Sitting   Lower Body Bathing: Sit to/from stand;Moderate assistance   Upper Body Dressing : Set up;Sitting   Lower Body Dressing: Sit to/from stand;Moderate assistance   Toilet Transfer: Minimal assistance;BSC/3in1;Stand-pivot   Toileting- Clothing Manipulation and Hygiene: Min guard;Sit to/from stand  Vision Baseline Vision/History: 1 Wears glasses Patient Visual Report: No change from baseline       Perception Perception Perception: Within Functional Limits   Praxis Praxis Praxis: Intact    Pertinent Vitals/Pain Faces Pain Scale: Hurts a little bit Pain Location:  abdomen Pain Descriptors / Indicators: Discomfort Pain Intervention(s): Monitored during session     Hand Dominance Right   Extremity/Trunk Assessment Upper Extremity Assessment Upper Extremity Assessment: Overall WFL for tasks assessed   Lower Extremity Assessment Lower Extremity Assessment: Defer to PT evaluation   Cervical / Trunk Assessment Cervical / Trunk Assessment: Normal   Communication Communication Communication: No difficulties   Cognition Arousal/Alertness: Awake/alert   Overall Cognitive Status: Within Functional Limits for tasks assessed                                       General Comments       Exercises     Shoulder Instructions      Home Living Family/patient expects to be discharged to:: Private residence Living Arrangements: Other relatives Available Help at Discharge: Family;Friend(s) Type of Home: Apartment Home Access: Stairs to enter Technical brewer of Steps: 3 Entrance Stairs-Rails: Right Home Layout: One level     Bathroom Shower/Tub: Teacher, early years/pre: Standard     Home Equipment: Grab bars - tub/shower          Prior Functioning/Environment Prior Level of Function : Independent/Modified Independent;Driving             Mobility Comments: Able to walk in the house without assist. ADLs Comments: Does all the housekeeping and cooking for her brother, assists with community mobility, able to perform her own self care.  Per patient, brother is very unkempt, relies on her for most IADL        OT Problem List: Decreased strength;Decreased activity tolerance;Impaired balance (sitting and/or standing);Pain      OT Treatment/Interventions: Self-care/ADL training;Therapeutic exercise;Therapeutic activities;Patient/family education;Balance training;Energy conservation    OT Goals(Current goals can be found in the care plan section) Acute Rehab OT Goals Patient Stated Goal: Unsure OT Goal  Formulation: With patient Time For Goal Achievement: 05/30/21 Potential to Achieve Goals: Good ADL Goals Pt Will Perform Grooming: with supervision;standing Pt Will Perform Lower Body Bathing: with supervision;sit to/from stand Pt Will Perform Lower Body Dressing: with supervision;sit to/from stand Pt Will Transfer to Toilet: with supervision;ambulating;regular height toilet Pt Will Perform Toileting - Clothing Manipulation and hygiene: with set-up;sit to/from stand Pt/caregiver will Perform Home Exercise Program: Increased strength;Both right and left upper extremity;With theraband;With written HEP provided;With Supervision  OT Frequency: Min 2X/week    Co-evaluation              AM-PAC OT "6 Clicks" Daily Activity     Outcome Measure Help from another person eating meals?: None Help from another person taking care of personal grooming?: None Help from another person toileting, which includes using toliet, bedpan, or urinal?: A Little Help from another person bathing (including washing, rinsing, drying)?: A Lot Help from another person to put on and taking off regular upper body clothing?: A Little Help from another person to put on and taking off regular lower body clothing?: A Lot 6 Click Score: 18   End of Session Equipment Utilized During Treatment: Gait belt;Rolling walker (2 wheels)  Activity Tolerance: Patient tolerated treatment well Patient left: in bed;with call bell/phone within reach;with  bed alarm set  OT Visit Diagnosis: Unsteadiness on feet (R26.81);Pain                Time: 0131-4388 OT Time Calculation (min): 18 min Charges:  OT General Charges $OT Visit: 1 Visit OT Evaluation $OT Eval Moderate Complexity: 1 Mod  05/16/2021  RP, OTR/L  Acute Rehabilitation Services  Office:  [place] 05/16/2021, 5:26 PM

## 2021-05-16 NOTE — Progress Notes (Signed)
°  Transition of Care [facility]) Screening Note   Patient Details  Name: Kathleen [name] Date of Birth: 1960-07-24   Transition of Care [facility]) CM/SW Contact:    Alfredia Ferguson, [place] Phone Number: 05/16/2021, 8:36 AM    Transition of Care Department [facility]) has reviewed patient and notes poor medical compliance and possible need for SNF vs. Home Health pending continued medical progress. We will continue to monitor patient advancement through interdisciplinary progression rounds. If new patient transition needs arise, please place a TOC consult.

## 2021-05-17 DIAGNOSIS — G9341 Metabolic encephalopathy: Secondary | ICD-10-CM

## 2021-05-17 DIAGNOSIS — D631 Anemia in chronic kidney disease: Secondary | ICD-10-CM

## 2021-05-17 LAB — CBC
HCT: 44.1 % (ref 36.0–46.0)
Hemoglobin: 14.2 g/dL (ref 12.0–15.0)
MCH: 29.2 pg (ref 26.0–34.0)
MCHC: 32.2 g/dL (ref 30.0–36.0)
MCV: 90.7 fL (ref 80.0–100.0)
Platelets: 151 10*3/uL (ref 150–400)
RBC: 4.86 MIL/uL (ref 3.87–5.11)
RDW: 18 % — ABNORMAL HIGH (ref 11.5–15.5)
WBC: 9.2 10*3/uL (ref 4.0–10.5)
nRBC: 0 % (ref 0.0–0.2)

## 2021-05-17 LAB — RENAL FUNCTION PANEL
Albumin: 3.3 g/dL — ABNORMAL LOW (ref 3.5–5.0)
Anion gap: 28 — ABNORMAL HIGH (ref 5–15)
BUN: 96 mg/dL — ABNORMAL HIGH (ref 6–20)
CO2: 14 mmol/L — ABNORMAL LOW (ref 22–32)
Calcium: 7.5 mg/dL — ABNORMAL LOW (ref 8.9–10.3)
Chloride: 91 mmol/L — ABNORMAL LOW (ref 98–111)
Creatinine, Ser: 19.68 mg/dL — ABNORMAL HIGH (ref 0.44–1.00)
GFR, Estimated: 2 mL/min — ABNORMAL LOW (ref >60)
Glucose, Bld: 68 mg/dL — ABNORMAL LOW (ref 70–99)
Phosphorus: 8.2 mg/dL — ABNORMAL HIGH (ref 2.5–4.6)
Potassium: 3.6 mmol/L (ref 3.5–5.1)
Sodium: 133 mmol/L — ABNORMAL LOW (ref 135–145)

## 2021-05-17 LAB — MAGNESIUM: Magnesium: 2.5 mg/dL — ABNORMAL HIGH (ref 1.7–2.4)

## 2021-05-17 LAB — HEPATITIS B SURFACE ANTIBODY,QUALITATIVE: Hep B S Ab: REACTIVE — AB

## 2021-05-17 MED ORDER — HEPARIN SODIUM (PORCINE) 5000 UNIT/ML IJ SOLN
5000.0000 [IU] | Freq: Three times a day (TID) | INTRAMUSCULAR | Status: DC
  Administered 2021-05-17 – 2021-06-04 (×44): 5000 [IU] via SUBCUTANEOUS
  Filled 2021-05-17 (×46): qty 1

## 2021-05-17 MED ORDER — HEPARIN SODIUM (PORCINE) 1000 UNIT/ML IJ SOLN
INTRAMUSCULAR | Status: AC
  Filled 2021-05-17: qty 4

## 2021-05-17 MED ORDER — NEPRO/CARBSTEADY PO LIQD
237.0000 mL | Freq: Three times a day (TID) | ORAL | Status: DC
  Administered 2021-05-17 – 2021-05-28 (×25): 237 mL via ORAL

## 2021-05-17 MED ORDER — RENA-VITE PO TABS
1.0000 | ORAL_TABLET | Freq: Every day | ORAL | Status: DC
  Administered 2021-05-17 – 2021-06-03 (×17): 1 via ORAL
  Filled 2021-05-17 (×17): qty 1

## 2021-05-17 NOTE — Progress Notes (Signed)
12lead EKG done this am. Jeannette Corpus made aware. Results didn't transfer in the computer- placed in patient's chart.  Vent rate 92 BPM PR interval 144 ms QRS Duration 88 ms QT/QTc-Baz 460/568 ms P-R-T axes 75 53 84

## 2021-05-17 NOTE — Progress Notes (Signed)
Pt goal not met d/t pt had low BP 80/70 during Uriah. Pt got  300 ml NS bolus. Pt final BP 115/71 HR 108.O2 sat 98 %  nasal cannula 2 L O2 in using . Pt sent to her room at stable condition. Dr. Royce Macadamia notified.

## 2021-05-17 NOTE — Progress Notes (Signed)
Initial Nutrition Assessment  DOCUMENTATION CODES:   Not applicable  INTERVENTION:   Recommend obtaining new weight Encourage good PO intake Renal Multivitamin w/ minerals daily Nepro Shake po TID, each supplement provides 425 kcal and 19 grams protein  NUTRITION DIAGNOSIS:   Increased nutrient needs related to chronic illness (ESRD, CHF, COPD) as evidenced by estimated needs.  GOAL:   Patient will meet greater than or equal to 90% of their needs  MONITOR:   PO intake, Supplement acceptance, Labs, Weight trends  REASON FOR ASSESSMENT:   Consult Assessment of nutrition requirement/status, Poor PO  ASSESSMENT:   61 y.o. female presented to the ED with rectal bleeding x3-5 days, poor PO intake, weakness, and fatigue. Pt reports missing at least the last 3 dialysis treatments due to not feeling well. PMH includes ESRD on HD (TTS), CHF, HTN, COPD, Lupus, and TIA. Pt admitted with failure to thrive and rectal bleeding.   Pt reports that she has not had an appetite for about 2 weeks, she has only been sipping on things due to everything going right through her. States that her diarrhea has slowed down since getting to the hospital.  Pt reports she's only ate jello while she's been sick. Pt reports that she was drinking a Nepro and she is enjoying it; pt agreeable to continue to drink.   Pt was unsure of EDW, guessing she is around 100# now. No new weight this admission; RN notified. Most recent weight is from 2 months prior, with no significant weight loss.   Per Nephrology's note, last HD was on 01/03 and EDW is 50.8 kg, but pt post-weight was 48.2 kg.  Medications reviewed and include: Renvela Labs reviewed:  - Sodium 133 - Calcium 7.5 - Phosphorus 8.2 - Magnesium 2.5  NUTRITION - FOCUSED PHYSICAL EXAM:  Deferred to follow-up due to RD working remotely.   Diet Order:   Diet Order             Diet renal with fluid restriction Fluid restriction: 1200 mL Fluid; Room  service appropriate? Yes; Fluid consistency: Thin  Diet effective now                   EDUCATION NEEDS:   No education needs have been identified at this time  Skin:  Skin Assessment: Reviewed RN Assessment (MASD - Buttocks)  Last BM:  01/07  Height:   Ht Readings from Last 1 Encounters:  03/12/21 5\' 4"  (1.626 m)    Weight:   Wt Readings from Last 1 Encounters:  03/14/21 48.7 kg    Ideal Body Weight:  54.6 kg  BMI:  There is no height or weight on file to calculate BMI.  Estimated Nutritional Needs:   Kcal:  1550-1750  Protein:  80-95 grams  Fluid:  UOP + 1L    Kathleen [name], RD, LDN Clinical Dietitian See [facility] for contact information.

## 2021-05-17 NOTE — Progress Notes (Signed)
[facility] Progress Note  Name: Kathleen [name] MRN: [id number] DOB: 1961-04-21  Chief Complaint:  Weakness   Subjective:  last HD outpatient on 1/3.  Her HD was pushed to today due to staffing. Team called me as she has been more altered today. Better now per nursing. She got some nepro.     Review of systems:   Reports some shortness of breath; no chest pain Normal BM's - no bloody BM's per RN  She requests ice chips  ------------- Background on consult:  Kathleen [name] is a 61 y.o. female with a history of ESRD on HD, COPD, HTN, migraine, lupus, and hx of seizures who presented to the hospital with weakness. She states that she was too weak to go to her last dialysis treatment and the ER has observed that she's too weak to get off of the bed.  Nephrology is consulted for assistance with management of HD.     Intake/Output Summary (Last 24 hours) at 05/17/2021 1106 Last data filed at 05/17/2021 1058 Gross per 24 hour  Intake 440 ml  Output --  Net 440 ml    Vitals:  Vitals:   05/17/21 0030 05/17/21 0324 05/17/21 0415 05/17/21 0817  BP: 112/71 99/69  98/78  Pulse:  87  82  Resp:    19  Temp:    98.4 F (36.9 C)  TempSrc:    Oral  SpO2:  90% 97% 98%     Physical Exam:  General: adult female in bed in NAD  HEENT: NCAT Eyes: EOMI sclera anicteric Neck: supple trachea midline Heart: S1S2 no rub Lungs: clear to auscultation; normal work of breathing on 2 liters Abdomen: soft/nt/nd Extremities: no edema appreciated Neuro: tired but oriented x3 (to date and year) and follows commands; generally weak on gross exam Access LUE AVG with bruit  Medications reviewed   Labs:  BMP Latest Ref Rng & Units 05/17/2021 05/16/2021 05/15/2021  Glucose 70 - 99 mg/dL 68(L) 92 115(H)  BUN 6 - 20 mg/dL 96(H) 76(H) 65(H)  Creatinine 0.44 - 1.00 mg/dL 19.68(H) 18.80(H) 17.96(H)  Sodium 135 - 145 mmol/L 133(L) 138 135  Potassium 3.5 - 5.1 mmol/L 3.6 3.9 3.6  Chloride 98  - 111 mmol/L 91(L) 95(L) 93(L)  CO2 22 - 32 mmol/L 14(L) 15(L) 19(L)  Calcium 8.9 - 10.3 mg/dL 7.5(L) 8.7(L) 8.9   Outpatient HD orders:  [place]  3 hours and 15 minutes  TTS BF 350 DF auto 1.5 2 k/ 2 Ca 160 dialyzer  AVG EDW 50.8 kg (last post weight on 05/12/21 was 48.2 kg) Meds: sensipar 60 mg three times a week, hectorol 6 mcg every tx, venofer 100 mg weekly, mircera 75 mcg every 2 weeks (last dose on 04/28/21 - charted as not given on 1/3 and note elevated Hb)  Assessment/Plan:   # Weakness - felt secondary to covid   # Covid PNA  - Therapies per primary team    # ESRD - she will get HD today - HD RN is coming to her next.  Tx was delayed due to staffing; normally on a TTS schedule and she missed HD prior to arrival - would change patient to 180 dialyzer outpatient and will make that change here   # GI bleed - per primary team  - normal Hb    # Hx anemia 2/2 ESRD and now with acute blood loss  - Hb above goal - defer ESA (note recent dose was held as well)   #  Secondary hyperparathyroidism  - continue hectorol and sensipar (note both are given at outpatient HD and should not appear on her outpatient med rec) - on renvela  Disposition - continue inpatient monitoring  Claudia Desanctis, MD 05/17/2021  11:24 AM

## 2021-05-17 NOTE — Progress Notes (Addendum)
PROGRESS NOTE        PATIENT DETAILS Name: Kathleen [name] Age: 61 y.o. Sex: female Date of Birth: May 05, 1961 Admit Date: 05/15/2021 Admitting Physician Orma Flaming, MD LFY:BOFBP, Beverly Gust, NP  Brief Narrative: Patient is a 61 y.o. female with history of ESRD on HD TTS, HFrEF, SLE, anemia due to CKD, HTN-who presented with at least 1 week history of worsening weakness, intermittent diarrhea, hematochezia-due to weakness-she missed her last 3 HD sessions-she was found to have COVID-19 infection and subsequently admitted to the hospitalist service.  Subjective: Diarrhea much better-hematochezia has resolved.  Per nursing staff-patient is much more sleepier than yesterday.  During my evaluation-appears lethargic but easily arousable.  Answers questions appropriately.  Objective: Vitals: Blood pressure 98/78, pulse 82, temperature 98.4 F (36.9 C), temperature source Oral, resp. rate 19, SpO2 98 %.   Exam: Gen Exam:Alert awake-not in any distress.  Looks frail and debilitated. HEENT:atraumatic, normocephalic Chest: B/L clear to auscultation anteriorly CVS:S1S2 regular Abdomen:soft non tender, non distended Extremities:no edema Neurology: Non focal-but with severe generalized weakness. Skin: no rash   Pertinent Labs/Radiology: Recent Labs  Lab 05/15/21 1549 05/15/21 2301 05/17/21 0313  WBC 13.6*   < > 9.2  HGB 15.7*   < > 14.2  PLT 150   < > 151  NA 135   < > 133*  K 3.6   < > 3.6  CREATININE 17.96*   < > 19.68*  AST 43*  --   --   ALT 12  --   --   ALKPHOS 66  --   --   BILITOT 0.7  --   --    < > = values in this interval not displayed.     Assessment/Plan: COVID-19 infection: Afebrile-diarrhea improving-no PNA on CXR.  Plan on Remdesivir x3 days.    Diarrhea with intermittent hematochezia: Suspect some mucosal sloughing in the setting of COVID-19 related diarrhea.  Hematochezia has resolved per nursing staff.  Diarrhea better.  Continue  as needed Imodium.  On Remdesivir.  Deconditioning/debility: Likely due to COVID-19 infection-and worsening uremia from missed HD.  Continue to treat underlying COVID-19 infection-nephrology plan to dialyze today-appreciate PT/OT eval.  Will discuss with social work regarding eventual SNF rehab when ready for discharge.  Acute metabolic encephalopathy: Suspect worsening lethargy is from uremia in the setting of missed HD.  She may also have contribution to encephalopathy from COVID-19 infection.  Due to staffing issues she did not get dialysis yesterday-spoke with nephrologist-Dr. Alwyn Ren are for dialysis later today.  ESRD: Nephrology following-we will defer hemodialysis to nephrology service.  Chronic systolic heart failure: Volume status is stable.  Watch closely.  SLE: Stable-apparently no longer on Plaquenil.  HTN: BP soft-continue to hold Norvasc.  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  Continue to avoid QTC prolonging agents.  Watch closely on telemetry.  Hopefully we can see some improvement after hemodialysis.    Tobacco abuse: Continue transdermal nicotine  BMI Estimated body mass index is 18.43 kg/m as calculated from the following:   Height as of 03/12/21: 5\' 4"  (1.626 m).   Weight as of 03/14/21: 48.7 kg.    Procedures: None Consults: None DVT Prophylaxis: SQ heparin Code Status:Full code  Family Communication: None at bedside  Time spent: 35 minutes-Greater than 50% of this time  was spent in counseling, explanation of diagnosis, planning of further management, and coordination of care.   Disposition Plan: Status is: Inpatient  Severe debility/deconditioning-now with worsening encephalopathy due to uremia-needs hemodialysis today.  On IV Remdesivir for COVID.  SNF planned on discharge.  Diet: Diet Order             Diet renal with fluid restriction Fluid restriction: 1200 mL Fluid; Room service  appropriate? Yes; Fluid consistency: Thin  Diet effective now                     Antimicrobial agents: Anti-infectives (From admission, onward)    Start     Dose/Rate Route Frequency Ordered Stop   05/17/21 1000  remdesivir 100 mg in sodium chloride 0.9 % 100 mL IVPB       See Hyperspace for full Linked Orders Report.   100 mg 200 mL/hr over 30 Minutes Intravenous Daily 05/16/21 0847 05/21/21 0959   05/16/21 1000  remdesivir 200 mg in sodium chloride 0.9% 250 mL IVPB       See Hyperspace for full Linked Orders Report.   200 mg 580 mL/hr over 30 Minutes Intravenous Once 05/16/21 0847 05/16/21 1043        MEDICATIONS: Scheduled Meds:  Chlorhexidine Gluconate Cloth  6 each Topical Q0600   [START ON 05/18/2021] cinacalcet  60 mg Oral Once per day on Mon Wed Fri   cinacalcet  60 mg Oral Q T,Th,Sa-HD   doxercalciferol  6 mcg Intravenous Q T,Th,Sa-HD   feeding supplement (NEPRO CARB STEADY)  237 mL Oral TID BM   multivitamin  1 tablet Oral QHS   nicotine  14 mg Transdermal Daily   sevelamer carbonate  1,600 mg Oral TID WC   sodium chloride flush  3 mL Intravenous Q12H   Continuous Infusions:  sodium chloride     sodium chloride 100 mL (05/17/21 0021)   sodium chloride     remdesivir 100 mg in NS 100 mL     PRN Meds:.sodium chloride, sodium chloride, sodium chloride, acetaminophen **OR** acetaminophen, alteplase, heparin, lidocaine (PF), lidocaine-prilocaine, loperamide, oxyCODONE, pentafluoroprop-tetrafluoroeth, sodium chloride flush   I have personally reviewed following labs and imaging studies  LABORATORY DATA: CBC: Recent Labs  Lab 05/15/21 1549 05/15/21 2301 05/16/21 0150 05/16/21 0900 05/17/21 0313  WBC 13.6* 13.2* 14.4* 9.5 9.2  NEUTROABS 11.7*  --   --   --   --   HGB 15.7* 14.9 15.8* 14.9 14.2  HCT 50.4* 47.7* 49.3* 48.8* 44.1  MCV 92.8 92.4 92.1 95.5 90.7  PLT 150 124* 138* 140* 151     Basic Metabolic Panel: Recent Labs  Lab 05/15/21 1549  05/16/21 0157 05/17/21 0313  NA 135 138 133*  K 3.6 3.9 3.6  CL 93* 95* 91*  CO2 19* 15* 14*  GLUCOSE 115* 92 68*  BUN 65* 76* 96*  CREATININE 17.96* 18.80* 19.68*  CALCIUM 8.9 8.7* 7.5*  MG 2.9*  --  2.5*  PHOS  --   --  8.2*     GFR: CrCl cannot be calculated (Unknown ideal weight.).  Liver Function Tests: Recent Labs  Lab 05/15/21 1549 05/17/21 0313  AST 43*  --   ALT 12  --   ALKPHOS 66  --   BILITOT 0.7  --   PROT 9.1*  --   ALBUMIN 4.0 3.3*    No results for input(s): LIPASE, AMYLASE in the last 168 hours. Recent Labs  Lab 05/15/21 1550  AMMONIA 28     Coagulation Profile: No results for input(s): INR, PROTIME in the last 168 hours.  Cardiac Enzymes: No results for input(s): CKTOTAL, CKMB, CKMBINDEX, TROPONINI in the last 168 hours.  BNP (last 3 results) No results for input(s): PROBNP in the last 8760 hours.  Lipid Profile: No results for input(s): CHOL, HDL, LDLCALC, TRIG, CHOLHDL, LDLDIRECT in the last 72 hours.  Thyroid Function Tests: Recent Labs    05/15/21 2200  TSH 2.299     Anemia Panel: No results for input(s): VITAMINB12, FOLATE, FERRITIN, TIBC, IRON, RETICCTPCT in the last 72 hours.  Urine analysis:    Component Value Date/Time   COLORURINE YELLOW 05/15/2021 2252   APPEARANCEUR CLEAR 05/15/2021 2252   LABSPEC 1.020 05/15/2021 2252   PHURINE 6.0 05/15/2021 2252   GLUCOSEU NEGATIVE 05/15/2021 2252   HGBUR NEGATIVE 05/15/2021 2252   BILIRUBINUR NEGATIVE 05/15/2021 2252   KETONESUR NEGATIVE 05/15/2021 2252   PROTEINUR NEGATIVE 05/15/2021 2252   NITRITE NEGATIVE 05/15/2021 2252   LEUKOCYTESUR NEGATIVE 05/15/2021 2252    Sepsis Labs: Lactic Acid, Venous    Component Value Date/Time   LATICACIDVEN 1.40 07/11/2017 0545    MICROBIOLOGY: Recent Results (from the past 240 hour(s))  Resp Panel by RT-PCR (Flu A&B, Covid) Nasopharyngeal Swab     Status: Abnormal   Collection Time: 05/15/21  3:26 PM   Specimen:  Nasopharyngeal Swab; Nasopharyngeal(NP) swabs in vial transport medium  Result Value Ref Range Status   SARS Coronavirus 2 by RT PCR POSITIVE (A) NEGATIVE Final    Comment: (NOTE) SARS-CoV-2 target nucleic acids are DETECTED.  The SARS-CoV-2 RNA is generally detectable in upper respiratory specimens during the acute phase of infection. Positive results are indicative of the presence of the identified virus, but do not rule out bacterial infection or co-infection with other pathogens not detected by the test. Clinical correlation with patient history and other diagnostic information is necessary to determine patient infection status. The expected result is Negative.  Fact Sheet for Patients: EntrepreneurPulse.com.au  Fact Sheet for Healthcare Providers: IncredibleEmployment.be  This test is not yet approved or cleared by the [place] FDA and  has been authorized for detection and/or diagnosis of SARS-CoV-2 by FDA under an Emergency Use Authorization (EUA).  This EUA will remain in effect (meaning this test can be used) for the duration of  the COVID-19 declaration under Section 564(b)(1) of the A ct, 21 U.S.C. section 360bbb-3(b)(1), unless the authorization is terminated or revoked sooner.     Influenza A by PCR NEGATIVE NEGATIVE Final   Influenza B by PCR NEGATIVE NEGATIVE Final    Comment: (NOTE) The Xpert Xpress SARS-CoV-2/FLU/RSV plus assay is intended as an aid in the diagnosis of influenza from Nasopharyngeal swab specimens and should not be used as a sole basis for treatment. Nasal washings and aspirates are unacceptable for Xpert Xpress SARS-CoV-2/FLU/RSV testing.  Fact Sheet for Patients: EntrepreneurPulse.com.au  Fact Sheet for Healthcare Providers: IncredibleEmployment.be  This test is not yet approved or cleared by the [place] FDA and has been authorized for detection and/or  diagnosis of SARS-CoV-2 by FDA under an Emergency Use Authorization (EUA). This EUA will remain in effect (meaning this test can be used) for the duration of the COVID-19 declaration under Section 564(b)(1) of the Act, 21 U.S.C. section 360bbb-3(b)(1), unless the authorization is terminated or revoked.  Performed at [facility], [place] [address] [place], [place] [address]     RADIOLOGY STUDIES/RESULTS: CT ABDOMEN PELVIS WO CONTRAST  Result Date: 05/15/2021 CLINICAL DATA:  Abdominal pain EXAM: CT ABDOMEN AND PELVIS WITHOUT CONTRAST TECHNIQUE: Multidetector CT imaging of the abdomen and pelvis was performed following the standard protocol without IV contrast. COMPARISON:  CT abdomen and pelvis 03/12/2018 FINDINGS: Lower chest: Hyperinflated lungs with minor dependent subsegmental atelectatic changes. Hepatobiliary: Liver is normal in size and contour with no suspicious mass appreciated. Calcified gallstones visualized measuring up to 12 mm in size. No definite gallbladder wall thickening or pericholecystic edema appreciated. No biliary ductal dilatation identified. Pancreas: Unremarkable. No pancreatic ductal dilatation or surrounding inflammatory changes. Spleen: Normal in size without focal abnormality. Adrenals/Urinary Tract: No adrenal mass visualized. No nephrolithiasis or hydronephrosis identified bilaterally. Urinary bladder is nondistended and not well evaluated. Stomach/Bowel: No bowel obstruction, free air or pneumatosis. No definite evidence of bowel edema. No evidence of acute appendicitis in the right lower quadrant. Vascular/Lymphatic: Severe atherosclerotic disease. No bulky lymphadenopathy identified. Reproductive: Uterus and bilateral adnexa are unremarkable. Other: No ascites. Musculoskeletal: No suspicious bony lesions identified. IMPRESSION: 1. No acute process identified. 2. Cholelithiasis. Electronically Signed   By: Ofilia Neas M.D.   On: 05/15/2021 16:10   DG Chest 2  View  Result Date: 05/15/2021 CLINICAL DATA:  Shortness of breath EXAM: CHEST - 2 VIEW COMPARISON:  Chest radiograph dated March 15, 2021 FINDINGS: The heart size and mediastinal contours are within normal limits. Aorta is tortuous with atherosclerotic calcifications. Both lungs are clear. The visualized skeletal structures are unremarkable. IMPRESSION: No active cardiopulmonary disease. Electronically Signed   By: Keane Police D.O.   On: 05/15/2021 16:19     LOS: 1 day   Oren Binet, MD  [facility]    To contact the attending provider between 7A-7P or the covering provider during after hours 7P-7A, please log into the web site [email or web address] and access using universal [place] password for that web site. If you do not have the password, please call the hospital operator.  05/17/2021, 10:21 AM

## 2021-05-17 NOTE — Progress Notes (Signed)
°   05/16/21 2359  Vitals  Temp 98.2 F (36.8 C)  Temp Source Oral  BP (!) 84/59 (paged Xenia Blount,NP. PRN 100ML Bolus was given.)  MAP (mmHg) 69  BP Location Right Arm  BP Method Automatic  Patient Position (if appropriate) Lying  Pulse Rate Source Monitor  Resp 18  MEWS COLOR  MEWS Score Color Green  Oxygen Therapy  SpO2 100 %  O2 Device Nasal Cannula  O2 Flow Rate (L/min) 2 L/min  MEWS Score  MEWS Temp 0  MEWS Systolic 1  MEWS Pulse 0  MEWS RR 0  MEWS LOC 0  MEWS Score 1   Patient feels very weak. BP 84/59. Jeannette Corpus ,NP made aware. PRN (2x) 100cc bolus was administered. BP went up to 112/71.will continue to close monitor patient.

## 2021-05-18 LAB — RENAL FUNCTION PANEL
Albumin: 2.7 g/dL — ABNORMAL LOW (ref 3.5–5.0)
Anion gap: 18 — ABNORMAL HIGH (ref 5–15)
BUN: 46 mg/dL — ABNORMAL HIGH (ref 6–20)
CO2: 23 mmol/L (ref 22–32)
Calcium: 7.9 mg/dL — ABNORMAL LOW (ref 8.9–10.3)
Chloride: 95 mmol/L — ABNORMAL LOW (ref 98–111)
Creatinine, Ser: 10.84 mg/dL — ABNORMAL HIGH (ref 0.44–1.00)
GFR, Estimated: 4 mL/min — ABNORMAL LOW (ref >60)
Glucose, Bld: 92 mg/dL (ref 70–99)
Phosphorus: 4.6 mg/dL (ref 2.5–4.6)
Potassium: 3.2 mmol/L — ABNORMAL LOW (ref 3.5–5.1)
Sodium: 136 mmol/L (ref 135–145)

## 2021-05-18 LAB — CBC
HCT: 38.2 % (ref 36.0–46.0)
Hemoglobin: 12.8 g/dL (ref 12.0–15.0)
MCH: 29.2 pg (ref 26.0–34.0)
MCHC: 33.5 g/dL (ref 30.0–36.0)
MCV: 87 fL (ref 80.0–100.0)
Platelets: 164 10*3/uL (ref 150–400)
RBC: 4.39 MIL/uL (ref 3.87–5.11)
RDW: 17.6 % — ABNORMAL HIGH (ref 11.5–15.5)
WBC: 8.9 10*3/uL (ref 4.0–10.5)
nRBC: 0 % (ref 0.0–0.2)

## 2021-05-18 LAB — HEPATITIS B SURFACE ANTIBODY, QUANTITATIVE: Hep B S AB Quant (Post): 106.8 m[IU]/mL (ref >9.9)

## 2021-05-18 MED ORDER — FAMOTIDINE 20 MG PO TABS
20.0000 mg | ORAL_TABLET | Freq: Every day | ORAL | Status: DC
  Administered 2021-05-18 – 2021-06-04 (×15): 20 mg via ORAL
  Filled 2021-05-18 (×17): qty 1

## 2021-05-18 MED ORDER — POTASSIUM CHLORIDE CRYS ER 20 MEQ PO TBCR
40.0000 meq | EXTENDED_RELEASE_TABLET | Freq: Once | ORAL | Status: AC
  Administered 2021-05-18: 40 meq via ORAL
  Filled 2021-05-18: qty 2

## 2021-05-18 NOTE — Progress Notes (Signed)
Patient asked to be placed on bed pan. After placing patient on bed pan she is refusing to be taken off of the bed pan. Patient states that she "can't stop going poop. I am not ready to get off yet.". RN and tech have made several attempts to take patient off of the bed pan and she continues to refuse. Patient educated about prolonged use of the bed pan causing skin break down or pressure ulcers. Patient states she is not done yet and will not get off yet. RN will continue to educate and encourage patient to come off of the bed pan.

## 2021-05-18 NOTE — NC FL2 (Signed)
[place] LEVEL OF CARE SCREENING TOOL     IDENTIFICATION  Patient Name: Kathleen [name] Birthdate: 1960-10-21 Sex: female Admission Date (Current Location): 05/15/2021  [facility] and [place] Number:  Herbalist and Address:  [facility] [place], [place] [address], [place], [place] [address]      Provider Number: [id number]  Attending Physician Name and Address:  Jonetta Osgood, MD  Relative Name and Phone Number:  Leward Quan, [phone]    Current Level of Care: Hospital Recommended Level of Care: [place] Prior Approval Number:    Date Approved/Denied:   PASRR Number: [id number]  Discharge Plan: SNF    Current Diagnoses: Patient Active Problem List   Diagnosis Date Noted   Uremia 05/16/2021   Rectal bleeding 05/15/2021   Chronic combined systolic and diastolic CHF (congestive heart failure) ([place]) 05/15/2021   FTT (failure to thrive) in adult 05/15/2021   Hyperkalemia 03/12/2021   Prolonged QT interval 03/12/2021   Acute respiratory failure with hypoxia ([place]) 03/12/2021   Acute on chronic diastolic congestive heart failure ([place]) 03/12/2021   Onychomycosis 11/25/2018   Pre-ulcerative calluses 11/25/2018   Neuropathy 10/04/2018   Acute encephalopathy 07/11/2017   ESRD (end stage renal disease) ([place]) 07/11/2017   COPD (chronic obstructive pulmonary disease) ([place])    Anemia in ESRD (end-stage renal disease) ([place])    Coagulation defect, unspecified ([place]) 07/09/2017   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 ([place]) 07/09/2017   Iron deficiency anemia, unspecified 07/09/2017   Secondary hyperparathyroidism of renal origin ([place]) 07/09/2017   Tobacco use 07/09/2017   Tobacco dependence 11/30/2016   Malnutrition of moderate degree 07/14/2016   Lupus ([place]) 06/28/2016   Hypertension 06/28/2016   SLE exacerbation ([place]) 06/28/2016   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    High risk medication use 05/31/2012    Orientation RESPIRATION BLADDER Height & Weight     Self, Situation, Place  O2 (NC2) Continent Weight: 108 lb 11 oz (49.3 kg) Height:     BEHAVIORAL SYMPTOMS/MOOD NEUROLOGICAL BOWEL NUTRITION STATUS      Incontinent Diet (See DC summary)  AMBULATORY STATUS COMMUNICATION OF NEEDS Skin   Extensive Assist Verbally Skin abrasions (MASD Thigh and bilateral Buttocks.)                       Personal Care Assistance Level of Assistance  Bathing, Feeding, Dressing Bathing Assistance: Limited assistance Feeding assistance: Independent Dressing Assistance: Limited assistance     Functional Limitations Info  Sight, Hearing, Speech Sight Info: Adequate Hearing Info: Adequate Speech Info: Adequate    SPECIAL CARE FACTORS FREQUENCY  PT (By licensed PT), OT (By licensed OT)     PT Frequency: 5x week OT Frequency: 5x week            Contractures Contractures Info: Not present    Additional Factors Info  Code Status, Allergies Code Status Info: Full Allergies Info: NKA           Current Medications (05/18/2021):  This is the current hospital active medication list Current Facility-Administered Medications  Medication Dose Route Frequency Provider Last Rate Last Admin   0.9 %  sodium chloride infusion  250 mL Intravenous PRN Orma Flaming, MD       0.9 %  sodium chloride infusion  100 mL Intravenous PRN Claudia Desanctis, MD 999 mL/hr at 05/17/21 0021 100 mL at 05/17/21 0021   0.9 %  sodium  chloride infusion  100 mL Intravenous PRN Claudia Desanctis, MD       acetaminophen (TYLENOL) tablet 650 mg  650 mg Oral Q6H PRN Orma Flaming, MD       Or   acetaminophen (TYLENOL) suppository 650 mg  650 mg Rectal Q6H PRN Orma Flaming, MD       alteplase (CATHFLO ACTIVASE) injection 2 mg  2 mg Intracatheter Once PRN Claudia Desanctis, MD       Chlorhexidine Gluconate Cloth 2 % PADS 6 each  6 each Topical Q0600 Claudia Desanctis, MD   6 each at 05/18/21  0805   cinacalcet (SENSIPAR) tablet 60 mg  60 mg Oral Once per day on Mon Wed Fri Wolfe, Allison, MD   60 mg at 05/18/21 0805   cinacalcet (SENSIPAR) tablet 60 mg  60 mg Oral Q T,Th,Sa-HD Claudia Desanctis, MD   60 mg at 05/16/21 1149   doxercalciferol (HECTOROL) injection 6 mcg  6 mcg Intravenous Q T,Th,Sa-HD Claudia Desanctis, MD       famotidine (PEPCID) tablet 20 mg  20 mg Oral Daily Ghimire, Henreitta Leber, MD       feeding supplement (NEPRO CARB STEADY) liquid 237 mL  237 mL Oral TID BM Jonetta Osgood, MD   237 mL at 05/18/21 0806   heparin injection 1,000 Units  1,000 Units Dialysis PRN Claudia Desanctis, MD       heparin injection 5,000 Units  5,000 Units Subcutaneous Q8H Jonetta Osgood, MD   5,000 Units at 05/18/21 0634   lidocaine (PF) (XYLOCAINE) 1 % injection 5 mL  5 mL Intradermal PRN Claudia Desanctis, MD       lidocaine-prilocaine (EMLA) cream 1 application  1 application Topical PRN Claudia Desanctis, MD       multivitamin (RENA-VIT) tablet 1 tablet  1 tablet Oral QHS Jonetta Osgood, MD   1 tablet at 05/17/21 2140   nicotine (NICODERM CQ - dosed in mg/24 hours) patch 14 mg  14 mg Transdermal Daily Orma Flaming, MD   14 mg at 05/18/21 0827   oxyCODONE (Oxy IR/ROXICODONE) immediate release tablet 5 mg  5 mg Oral Q4H PRN Orma Flaming, MD   5 mg at 05/16/21 2094   pentafluoroprop-tetrafluoroeth (GEBAUERS) aerosol 1 application  1 application Topical PRN Claudia Desanctis, MD       sevelamer carbonate (RENVELA) tablet 1,600 mg  1,600 mg Oral TID WC Orma Flaming, MD   1,600 mg at 05/18/21 0805   sodium chloride flush (NS) 0.9 % injection 3 mL  3 mL Intravenous Q12H Orma Flaming, MD   3 mL at 05/18/21 0806   sodium chloride flush (NS) 0.9 % injection 3 mL  3 mL Intravenous PRN Orma Flaming, MD         Discharge Medications: Please see discharge summary for a list of discharge medications.  Relevant Imaging Results:  Relevant Lab Results:   Additional Information SS# [id number]  [address], [place]

## 2021-05-18 NOTE — Progress Notes (Signed)
PROGRESS NOTE        PATIENT DETAILS Name: Kathleen [name] Age: 61 y.o. Sex: female Date of Birth: Jan 11, 1961 Admit Date: 05/15/2021 Admitting Physician Orma Flaming, MD XID:[id number], Beverly Gust, NP  Brief Narrative: Patient is a 61 y.o. female with history of ESRD on HD TTS, HFrEF, SLE, anemia due to CKD, HTN-who presented with at least 1 week history of worsening weakness, intermittent diarrhea, hematochezia-due to weakness-she missed her last 3 HD sessions-she was found to have COVID-19 infection and subsequently admitted to the hospitalist service.  Subjective: Much better-more awake alert-minimal diarrhea at times.  No further hematochezia.  Objective: Vitals: Blood pressure 109/71, pulse 85, temperature 98.9 F (37.2 C), temperature source Oral, resp. rate 16, weight 49.3 kg, SpO2 100 %.   Exam: Gen Exam:Alert awake-not in any distress.  Appears frail and cachectic. HEENT:atraumatic, normocephalic Chest: B/L clear to auscultation anteriorly CVS:S1S2 regular Abdomen:soft non tender, non distended Extremities:no edema Neurology: Non focal Skin: no rash    Pertinent Labs/Radiology: Recent Labs  Lab 05/15/21 1549 05/15/21 2301 05/18/21 0239  WBC 13.6*   < > 8.9  HGB 15.7*   < > 12.8  PLT 150   < > 164  NA 135   < > 136  K 3.6   < > 3.2*  CREATININE 17.96*   < > 10.84*  AST 43*  --   --   ALT 12  --   --   ALKPHOS 66  --   --   BILITOT 0.7  --   --    < > = values in this interval not displayed.     Assessment/Plan: COVID-19 infection: Afebrile-diarrhea has improved-no PNA on CXR.  Has completed 3 days of Remdesivir   Diarrhea with intermittent hematochezia: Suspect some mucosal sloughing in the setting of COVID-19 related diarrhea.  Hematochezia has resolved per nursing staff.  Diarrhea has improved.  Since hematochezia appears minimal with stable hemoglobin-suspect endoscopic evaluation can be considered in the outpatient setting when  patient has recovered from this acute illness.  Deconditioning/debility: Likely due to COVID-19 infection-and worsening uremia from missed HD.  Hopefully will continue to improve with further hemodialysis-improvement in underlying COVID-19 infection.  Evaluated by PT services-felt to require SNF on discharge.  Social worker following.   Acute metabolic encephalopathy: Suspect worsening lethargy is from uremia in the setting of missed HD.  She may also have contribution to encephalopathy from COVID-19 infection.  Mental status is improved after HD yesterday.   ESRD: Nephrology following-we will defer hemodialysis to nephrology service.  Hypokalemia: We will replete and recheck  Chronic systolic heart failure: Volume status is stable.  Watch closely.  SLE: Stable-apparently no longer on Plaquenil.  HTN: BP soft-continue to hold Norvasc.  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  Continue to monitor on telemetry-discussed with pharmacy yesterday-no major offending agents-both Remdesivir and Imodium have been discontinued.  .    Tobacco abuse: Continue transdermal nicotine  BMI Estimated body mass index is 18.66 kg/m as calculated from the following:   Height as of 03/12/21: 5\' 4"  (1.626 m).   Weight as of this encounter: 49.3 kg.    Procedures: None Consults: None DVT Prophylaxis: SQ heparin Code Status:Full code  Family Communication: None at bedside  Time spent: 35 minutes-Greater than 50% of this  time was spent in counseling, explanation of diagnosis, planning of further management, and coordination of care.   Disposition Plan: Status is: Inpatient  Severe debility/deconditioning-now with worsening encephalopathy due to uremia-needs hemodialysis today.  On IV Remdesivir for COVID.  SNF planned on discharge.  Diet: Diet Order             Diet renal with fluid restriction Fluid restriction: 1200 mL Fluid; Room service  appropriate? Yes; Fluid consistency: Thin  Diet effective now                     Antimicrobial agents: Anti-infectives (From admission, onward)    Start     Dose/Rate Route Frequency Ordered Stop   05/17/21 1000  remdesivir 100 mg in sodium chloride 0.9 % 100 mL IVPB       See Hyperspace for full Linked Orders Report.   100 mg 200 mL/hr over 30 Minutes Intravenous Daily 05/16/21 0847 05/18/21 0857   05/16/21 1000  remdesivir 200 mg in sodium chloride 0.9% 250 mL IVPB       See Hyperspace for full Linked Orders Report.   200 mg 580 mL/hr over 30 Minutes Intravenous Once 05/16/21 0847 05/16/21 1043        MEDICATIONS: Scheduled Meds:  Chlorhexidine Gluconate Cloth  6 each Topical Q0600   cinacalcet  60 mg Oral Once per day on Mon Wed Fri   cinacalcet  60 mg Oral Q T,Th,Sa-HD   doxercalciferol  6 mcg Intravenous Q T,Th,Sa-HD   famotidine  20 mg Oral Daily   feeding supplement (NEPRO CARB STEADY)  237 mL Oral TID BM   heparin injection (subcutaneous)  5,000 Units Subcutaneous Q8H   multivitamin  1 tablet Oral QHS   nicotine  14 mg Transdermal Daily   sevelamer carbonate  1,600 mg Oral TID WC   sodium chloride flush  3 mL Intravenous Q12H   Continuous Infusions:  sodium chloride     sodium chloride 100 mL (05/17/21 0021)   sodium chloride     PRN Meds:.sodium chloride, sodium chloride, sodium chloride, acetaminophen **OR** acetaminophen, alteplase, heparin, lidocaine (PF), lidocaine-prilocaine, oxyCODONE, pentafluoroprop-tetrafluoroeth, sodium chloride flush   I have personally reviewed following labs and imaging studies  LABORATORY DATA: CBC: Recent Labs  Lab 05/15/21 1549 05/15/21 2301 05/16/21 0150 05/16/21 0900 05/17/21 0313 05/18/21 0239  WBC 13.6* 13.2* 14.4* 9.5 9.2 8.9  NEUTROABS 11.7*  --   --   --   --   --   HGB 15.7* 14.9 15.8* 14.9 14.2 12.8  HCT 50.4* 47.7* 49.3* 48.8* 44.1 38.2  MCV 92.8 92.4 92.1 95.5 90.7 87.0  PLT 150 124* 138* 140* 151  164     Basic Metabolic Panel: Recent Labs  Lab 05/15/21 1549 05/16/21 0157 05/17/21 0313 05/18/21 0239  NA 135 138 133* 136  K 3.6 3.9 3.6 3.2*  CL 93* 95* 91* 95*  CO2 19* 15* 14* 23  GLUCOSE 115* 92 68* 92  BUN 65* 76* 96* 46*  CREATININE 17.96* 18.80* 19.68* 10.84*  CALCIUM 8.9 8.7* 7.5* 7.9*  MG 2.9*  --  2.5*  --   PHOS  --   --  8.2* 4.6     GFR: Estimated Creatinine Clearance: 4.3 mL/min (A) (by C-G formula based on SCr of 10.84 mg/dL (H)).  Liver Function Tests: Recent Labs  Lab 05/15/21 1549 05/17/21 0313 05/18/21 0239  AST 43*  --   --   ALT 12  --   --  ALKPHOS 66  --   --   BILITOT 0.7  --   --   PROT 9.1*  --   --   ALBUMIN 4.0 3.3* 2.7*    No results for input(s): LIPASE, AMYLASE in the last 168 hours. Recent Labs  Lab 05/15/21 1550  AMMONIA 28     Coagulation Profile: No results for input(s): INR, PROTIME in the last 168 hours.  Cardiac Enzymes: No results for input(s): CKTOTAL, CKMB, CKMBINDEX, TROPONINI in the last 168 hours.  BNP (last 3 results) No results for input(s): PROBNP in the last 8760 hours.  Lipid Profile: No results for input(s): CHOL, HDL, LDLCALC, TRIG, CHOLHDL, LDLDIRECT in the last 72 hours.  Thyroid Function Tests: Recent Labs    05/15/21 2200  TSH 2.299     Anemia Panel: No results for input(s): VITAMINB12, FOLATE, FERRITIN, TIBC, IRON, RETICCTPCT in the last 72 hours.  Urine analysis:    Component Value Date/Time   COLORURINE YELLOW 05/15/2021 2252   APPEARANCEUR CLEAR 05/15/2021 2252   LABSPEC 1.020 05/15/2021 2252   PHURINE 6.0 05/15/2021 2252   GLUCOSEU NEGATIVE 05/15/2021 2252   HGBUR NEGATIVE 05/15/2021 2252   BILIRUBINUR NEGATIVE 05/15/2021 2252   KETONESUR NEGATIVE 05/15/2021 2252   PROTEINUR NEGATIVE 05/15/2021 2252   NITRITE NEGATIVE 05/15/2021 2252   LEUKOCYTESUR NEGATIVE 05/15/2021 2252    Sepsis Labs: Lactic Acid, Venous    Component Value Date/Time   LATICACIDVEN 1.40  07/11/2017 0545    MICROBIOLOGY: Recent Results (from the past 240 hour(s))  Resp Panel by RT-PCR (Flu A&B, Covid) Nasopharyngeal Swab     Status: Abnormal   Collection Time: 05/15/21  3:26 PM   Specimen: Nasopharyngeal Swab; Nasopharyngeal(NP) swabs in vial transport medium  Result Value Ref Range Status   SARS Coronavirus 2 by RT PCR POSITIVE (A) NEGATIVE Final    Comment: (NOTE) SARS-CoV-2 target nucleic acids are DETECTED.  The SARS-CoV-2 RNA is generally detectable in upper respiratory specimens during the acute phase of infection. Positive results are indicative of the presence of the identified virus, but do not rule out bacterial infection or co-infection with other pathogens not detected by the test. Clinical correlation with patient history and other diagnostic information is necessary to determine patient infection status. The expected result is Negative.  Fact Sheet for Patients: EntrepreneurPulse.com.au  Fact Sheet for Healthcare Providers: IncredibleEmployment.be  This test is not yet approved or cleared by the [place] FDA and  has been authorized for detection and/or diagnosis of SARS-CoV-2 by FDA under an Emergency Use Authorization (EUA).  This EUA will remain in effect (meaning this test can be used) for the duration of  the COVID-19 declaration under Section 564(b)(1) of the A ct, 21 U.S.C. section 360bbb-3(b)(1), unless the authorization is terminated or revoked sooner.     Influenza A by PCR NEGATIVE NEGATIVE Final   Influenza B by PCR NEGATIVE NEGATIVE Final    Comment: (NOTE) The Xpert Xpress SARS-CoV-2/FLU/RSV plus assay is intended as an aid in the diagnosis of influenza from Nasopharyngeal swab specimens and should not be used as a sole basis for treatment. Nasal washings and aspirates are unacceptable for Xpert Xpress SARS-CoV-2/FLU/RSV testing.  Fact Sheet for  Patients: EntrepreneurPulse.com.au  Fact Sheet for Healthcare Providers: IncredibleEmployment.be  This test is not yet approved or cleared by the [place] FDA and has been authorized for detection and/or diagnosis of SARS-CoV-2 by FDA under an Emergency Use Authorization (EUA). This EUA will remain in effect (meaning this test  can be used) for the duration of the COVID-19 declaration under Section 564(b)(1) of the Act, 21 U.S.C. section 360bbb-3(b)(1), unless the authorization is terminated or revoked.  Performed at [facility], [place] [address], [place], [place] [address]     RADIOLOGY STUDIES/RESULTS: No results found.   LOS: 2 days   Oren Binet, MD  [facility]    To contact the attending provider between 7A-7P or the covering provider during after hours 7P-7A, please log into the web site [email or web address] and access using universal [place] password for that web site. If you do not have the password, please call the hospital operator.  05/18/2021, 12:51 PM

## 2021-05-18 NOTE — Progress Notes (Signed)
[facility] Progress Note  Name: Kathleen [name] MRN: [id number] DOB: Feb 17, 1961   Subjective: Patient not seen directly today given COVID-19 + status, utilizing data taken from chart +/- discussions w/ providers and staff.    ------------- Background on consult:  Kathleen [name] is a 61 y.o. female with a history of ESRD on HD, COPD, HTN, migraine, lupus, and hx of seizures who presented to the [name] with weakness. She states that she was too weak to go to her last dialysis treatment and the ER has observed that she's too weak to get off of the bed.  Nephrology is consulted for assistance with management of HD.     Intake/Output Summary (Last 24 hours) at 05/18/2021 1429 Last data filed at 05/17/2021 1855 Gross per 24 hour  Intake --  Output -300 ml  Net 300 ml     Vitals:  Vitals:   05/17/21 1855 05/17/21 2129 05/18/21 0634 05/18/21 0828  BP: 115/72 (!) 142/56 95/65 109/71  Pulse: (!) 108 94 96 85  Resp: 20 20 16 16   Temp: 98.6 F (37 C) 98.4 F (36.9 C) 99.2 F (37.3 C) 98.9 F (37.2 C)  TempSrc: Axillary Axillary Oral Oral  SpO2: 98% 98% 100% 100%  Weight: 49.3 kg        Physical Exam:  Patient not seen directly today given COVID-19 + status, utilizing data taken from chart +/- discussions w/ providers and staff.    [name]   3h 57min  50.8kg 350/1.5  2/2 bath F160  AVG    - last post weight 1/3 was 48.2 kg  - sensipar 60 mg three times a week  - hectorol 6 mcg IV tiw  - venofer 100 mg weekly  - mircera 75 mcg q 2wks last 12/20, held 1/3 due to Kathleen [name]    CXR 1/06 - IMPRESSION: No active cardiopulmonary disease.    Assessment/Plan: Generalized weakness - felt secondary to covid, +esrd, multifactorial. A bit better today per pmd.  Covid PNA  - therapies per primary team  ESRD - usually on TTS schedule. Missed HD prior to admission. Had HD here on 1/08 yesterday. HD tomorrow.  GI bleed - per primary team. Normal admit Hb.  Anemia  ckd - now + acute blood loss but Hb above goal. Will defer ESA, note recent OP dose was held as well.  MBD ckd - continue hectorol and sensipar (note both are given at outpatient HD and should not appear on her outpatient med rec). Cont renvela w/ meals.  Disposition - per pmd  [name], MD 05/18/2021, 2:37 PM

## 2021-05-19 LAB — RENAL FUNCTION PANEL
Albumin: 2.6 g/dL — ABNORMAL LOW (ref 3.5–5.0)
Anion gap: 18 — ABNORMAL HIGH (ref 5–15)
BUN: 81 mg/dL — ABNORMAL HIGH (ref 6–20)
CO2: 19 mmol/L — ABNORMAL LOW (ref 22–32)
Calcium: 6.9 mg/dL — ABNORMAL LOW (ref 8.9–10.3)
Chloride: 96 mmol/L — ABNORMAL LOW (ref 98–111)
Creatinine, Ser: 13 mg/dL — ABNORMAL HIGH (ref 0.44–1.00)
GFR, Estimated: 3 mL/min — ABNORMAL LOW (ref >60)
Glucose, Bld: 121 mg/dL — ABNORMAL HIGH (ref 70–99)
Phosphorus: 4 mg/dL (ref 2.5–4.6)
Potassium: 3.7 mmol/L (ref 3.5–5.1)
Sodium: 133 mmol/L — ABNORMAL LOW (ref 135–145)

## 2021-05-19 LAB — CBC
HCT: 35.1 % — ABNORMAL LOW (ref 36.0–46.0)
Hemoglobin: 11.5 g/dL — ABNORMAL LOW (ref 12.0–15.0)
MCH: 28.9 pg (ref 26.0–34.0)
MCHC: 32.8 g/dL (ref 30.0–36.0)
MCV: 88.2 fL (ref 80.0–100.0)
Platelets: 171 10*3/uL (ref 150–400)
RBC: 3.98 MIL/uL (ref 3.87–5.11)
RDW: 18.3 % — ABNORMAL HIGH (ref 11.5–15.5)
WBC: 9.4 10*3/uL (ref 4.0–10.5)
nRBC: 0 % (ref 0.0–0.2)

## 2021-05-19 LAB — URIC ACID: Uric Acid, Serum: 6.5 mg/dL (ref 2.5–7.1)

## 2021-05-19 LAB — GLUCOSE, CAPILLARY: Glucose-Capillary: 141 mg/dL — ABNORMAL HIGH (ref 70–99)

## 2021-05-19 MED ORDER — POTASSIUM CHLORIDE CRYS ER 20 MEQ PO TBCR: 20.0000 meq | EXTENDED_RELEASE_TABLET | Freq: Once | ORAL | Status: DC

## 2021-05-19 MED ORDER — PREDNISONE 20 MG PO TABS
40.0000 mg | ORAL_TABLET | Freq: Every day | ORAL | Status: DC
  Administered 2021-05-20 – 2021-05-24 (×5): 40 mg via ORAL
  Filled 2021-05-19 (×5): qty 2

## 2021-05-19 NOTE — Progress Notes (Signed)
Pt receives out-pt HD at [facility] on TTS. Pt arrives at 6:10 for 6:30 chair time. Clinic advised pt is covid positive and pt's time will likely change at d/c at clinic due to diagnosis. Will f/u with clinic to confirm time once d/c date is known. Will assist as needed.  Melven Sartorius Renal Navigator [phone]

## 2021-05-19 NOTE — Progress Notes (Signed)
PROGRESS NOTE        PATIENT DETAILS Name: Kathleen [name] Age: 61 y.o. Sex: female Date of Birth: 04/04/1961 Admit Date: 05/15/2021 Admitting Physician Orma Flaming, MD [id number], Beverly Gust, NP  Brief Narrative: Patient is a 61 y.o. female with history of ESRD on HD TTS, HFrEF, SLE, anemia due to CKD, HTN-who presented with at least 1 week history of worsening weakness, intermittent diarrhea, hematochezia-due to weakness-she missed her last 3 HD sessions-she was found to have COVID-19 infection and subsequently admitted to the hospitalist service.  Subjective: Mental status continues to improve-she is much more awake and alert.  Diarrhea has essentially resolved-only had 2 loose BMs yesterday.  No further hematochezia.  Complains of generalized arthralgias but most worse in her left wrist.  Objective: Vitals: Blood pressure 131/83, pulse 91, temperature (!) 97.5 F (36.4 C), temperature source Temporal, resp. rate 19, weight 48 kg, SpO2 98 %.   Exam: Gen Exam:Alert awake-not in any distress HEENT:atraumatic, normocephalic Chest: B/L clear to auscultation anteriorly CVS:S1S2 regular Abdomen:soft non tender, non distended Extremities:no edema.  Left wrist is tender to touch-no swelling-erythema not evident. Neurology: Non focal Skin: no rash    Pertinent Labs/Radiology: Recent Labs  Lab 05/15/21 1549 05/15/21 2301 05/19/21 0634  WBC 13.6*   < > 9.4  HGB 15.7*   < > 11.5*  PLT 150   < > 171  NA 135   < > 133*  K 3.6   < > 3.7  CREATININE 17.96*   < > 13.00*  AST 43*  --   --   ALT 12  --   --   ALKPHOS 66  --   --   BILITOT 0.7  --   --    < > = values in this interval not displayed.     Assessment/Plan: COVID-19 infection: Afebrile-diarrhea has improved-no PNA on CXR.  Has completed 3 days of Remdesivir   Diarrhea with intermittent hematochezia: Diarrhea due to COVID-19 infection-suspect hematochezia was in the setting of some mucosal  sloughing.  Hematochezia has resolved-diarrhea is significantly improved.  Even though she had hematochezia-hemoglobin is stable.  Given significant debility/deconditioning and improving lethargy-suspect we can postpone endoscopic evaluation to the outpatient setting.  Discussed this with patient-she agrees.    Deconditioning/debility: Likely due to COVID-19 infection-and worsening uremia from missed HD.  Hopefully will continue to improve with further hemodialysis-improvement in underlying COVID-19 infection.  Evaluated by PT services-felt to require SNF on discharge.  Social worker following.   Acute metabolic encephalopathy: Suspect worsening lethargy is from uremia in the setting of missed HD.  She may also have contribution to encephalopathy from COVID-19 infection.  Thankfully mental status has improved quite a bit-she is still somewhat lethargic and deconditioned.  ESRD: Nephrology following-we will defer hemodialysis to nephrology service.  Hypokalemia: Repleted  Chronic systolic heart failure (EF 45-50% on 03/13/2021): Volume status is stable.  Watch closely.  SLE: No longer on Plaquenil-Per patient-this was discontinued several months ago by her nephrologist.  She is now developing some generalized arthralgias-worse in her left wrist area (does not appear infected).  We will send out double-stranded DNA, complement levels, uric acid-and empirically place her on some prednisone.  Given that she has a prolonged QTC-Plaquenil is not an option.  Follow response to steroids over the next few days.  HTN: BP now slowly  creeping up-suspect we can still continue to monitor without the use of antihypertensives.  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  Continue telemetry monitoring-discussed with pharmacy a few days ago-no obvious offending medications-Remdesivir/Imodium have been discontinued.  Recheck EKG tomorrow-will give 1 additional dose of  20 MEQ of KCl today   Tobacco abuse: Continue transdermal nicotine  BMI Estimated body mass index is 18.16 kg/m as calculated from the following:   Height as of 03/12/21: 5\' 4"  (1.626 m).   Weight as of this encounter: 48 kg.    Procedures: None Consults: None DVT Prophylaxis: SQ heparin Code Status:Full code  Family Communication: None at bedside  Disposition Plan: Status is: Inpatient  Severe debility/deconditioning-now with worsening encephalopathy due to uremia-needs hemodialysis today.  On IV Remdesivir for COVID.  SNF planned on discharge.  Diet: Diet Order             Diet renal with fluid restriction Fluid restriction: 1200 mL Fluid; Room service appropriate? Yes; Fluid consistency: Thin  Diet effective now                     Antimicrobial agents: Anti-infectives (From admission, onward)    Start     Dose/Rate Route Frequency Ordered Stop   05/17/21 1000  remdesivir 100 mg in sodium chloride 0.9 % 100 mL IVPB       See Hyperspace for full Linked Orders Report.   100 mg 200 mL/hr over 30 Minutes Intravenous Daily 05/16/21 0847 05/18/21 0857   05/16/21 1000  remdesivir 200 mg in sodium chloride 0.9% 250 mL IVPB       See Hyperspace for full Linked Orders Report.   200 mg 580 mL/hr over 30 Minutes Intravenous Once 05/16/21 0847 05/16/21 1043        MEDICATIONS: Scheduled Meds:  Chlorhexidine Gluconate Cloth  6 each Topical Q0600   cinacalcet  60 mg Oral Once per day on Mon Wed Fri   cinacalcet  60 mg Oral Q T,Th,Sa-HD   doxercalciferol  6 mcg Intravenous Q T,Th,Sa-HD   famotidine  20 mg Oral Daily   feeding supplement (NEPRO CARB STEADY)  237 mL Oral TID BM   heparin injection (subcutaneous)  5,000 Units Subcutaneous Q8H   multivitamin  1 tablet Oral QHS   nicotine  14 mg Transdermal Daily   potassium chloride  20 mEq Oral Once   predniSONE  40 mg Oral Q breakfast   sevelamer carbonate  1,600 mg Oral TID WC   sodium chloride flush  3 mL  Intravenous Q12H   Continuous Infusions:  sodium chloride     sodium chloride 100 mL (05/17/21 0021)   sodium chloride     PRN Meds:.sodium chloride, sodium chloride, sodium chloride, acetaminophen **OR** acetaminophen, alteplase, heparin, lidocaine (PF), lidocaine-prilocaine, oxyCODONE, pentafluoroprop-tetrafluoroeth, sodium chloride flush   I have personally reviewed following labs and imaging studies  LABORATORY DATA: CBC: Recent Labs  Lab 05/15/21 1549 05/15/21 2301 05/16/21 0150 05/16/21 0900 05/17/21 0313 05/18/21 0239 05/19/21 0634  WBC 13.6*   < > 14.4* 9.5 9.2 8.9 9.4  NEUTROABS 11.7*  --   --   --   --   --   --   HGB 15.7*   < > 15.8* 14.9 14.2 12.8 11.5*  HCT 50.4*   < > 49.3* 48.8* 44.1 38.2 35.1*  MCV 92.8   < > 92.1 95.5 90.7 87.0 88.2  PLT 150   < >  138* 140* 151 164 171   < > = values in this interval not displayed.     Basic Metabolic Panel: Recent Labs  Lab 05/15/21 1549 05/16/21 0157 05/17/21 0313 05/18/21 0239 05/19/21 0634  NA 135 138 133* 136 133*  K 3.6 3.9 3.6 3.2* 3.7  CL 93* 95* 91* 95* 96*  CO2 19* 15* 14* 23 19*  GLUCOSE 115* 92 68* 92 121*  BUN 65* 76* 96* 46* 81*  CREATININE 17.96* 18.80* 19.68* 10.84* 13.00*  CALCIUM 8.9 8.7* 7.5* 7.9* 6.9*  MG 2.9*  --  2.5*  --   --   PHOS  --   --  8.2* 4.6 4.0     GFR: Estimated Creatinine Clearance: 3.5 mL/min (A) (by C-G formula based on SCr of 13 mg/dL (H)).  Liver Function Tests: Recent Labs  Lab 05/15/21 1549 05/17/21 0313 05/18/21 0239 05/19/21 0634  AST 43*  --   --   --   ALT 12  --   --   --   ALKPHOS 66  --   --   --   BILITOT 0.7  --   --   --   PROT 9.1*  --   --   --   ALBUMIN 4.0 3.3* 2.7* 2.6*    No results for input(s): LIPASE, AMYLASE in the last 168 hours. Recent Labs  Lab 05/15/21 1550  AMMONIA 28     Coagulation Profile: No results for input(s): INR, PROTIME in the last 168 hours.  Cardiac Enzymes: No results for input(s): CKTOTAL, CKMB,  CKMBINDEX, TROPONINI in the last 168 hours.  BNP (last 3 results) No results for input(s): PROBNP in the last 8760 hours.  Lipid Profile: No results for input(s): CHOL, HDL, LDLCALC, TRIG, CHOLHDL, LDLDIRECT in the last 72 hours.  Thyroid Function Tests: No results for input(s): TSH, T4TOTAL, FREET4, T3FREE, THYROIDAB in the last 72 hours.   Anemia Panel: No results for input(s): VITAMINB12, FOLATE, FERRITIN, TIBC, IRON, RETICCTPCT in the last 72 hours.  Urine analysis:    Component Value Date/Time   COLORURINE YELLOW 05/15/2021 2252   APPEARANCEUR CLEAR 05/15/2021 2252   LABSPEC 1.020 05/15/2021 2252   PHURINE 6.0 05/15/2021 2252   GLUCOSEU NEGATIVE 05/15/2021 2252   HGBUR NEGATIVE 05/15/2021 2252   BILIRUBINUR NEGATIVE 05/15/2021 2252   KETONESUR NEGATIVE 05/15/2021 2252   PROTEINUR NEGATIVE 05/15/2021 2252   NITRITE NEGATIVE 05/15/2021 2252   LEUKOCYTESUR NEGATIVE 05/15/2021 2252    Sepsis Labs: Lactic Acid, Venous    Component Value Date/Time   LATICACIDVEN 1.40 07/11/2017 0545    MICROBIOLOGY: Recent Results (from the past 240 hour(s))  Resp Panel by RT-PCR (Flu A&B, Covid) Nasopharyngeal Swab     Status: Abnormal   Collection Time: 05/15/21  3:26 PM   Specimen: Nasopharyngeal Swab; Nasopharyngeal(NP) swabs in vial transport medium  Result Value Ref Range Status   SARS Coronavirus 2 by RT PCR POSITIVE (A) NEGATIVE Final    Comment: (NOTE) SARS-CoV-2 target nucleic acids are DETECTED.  The SARS-CoV-2 RNA is generally detectable in upper respiratory specimens during the acute phase of infection. Positive results are indicative of the presence of the identified virus, but do not rule out bacterial infection or co-infection with other pathogens not detected by the test. Clinical correlation with patient history and other diagnostic information is necessary to determine patient infection status. The expected result is Negative.  Fact Sheet for  Patients: EntrepreneurPulse.com.au  Fact Sheet for Healthcare Providers: IncredibleEmployment.be  This test  is not yet approved or cleared by the [place] and  has been authorized for detection and/or diagnosis of SARS-CoV-2 by FDA under an Emergency Use Authorization (EUA).  This EUA will remain in effect (meaning this test can be used) for the duration of  the COVID-19 declaration under Section 564(b)(1) of the A ct, 21 U.S.C. section 360bbb-3(b)(1), unless the authorization is terminated or revoked sooner.     Influenza A by PCR NEGATIVE NEGATIVE Final   Influenza B by PCR NEGATIVE NEGATIVE Final    Comment: (NOTE) The Xpert Xpress SARS-CoV-2/FLU/RSV plus assay is intended as an aid in the diagnosis of influenza from Nasopharyngeal swab specimens and should not be used as a sole basis for treatment. Nasal washings and aspirates are unacceptable for Xpert Xpress SARS-CoV-2/FLU/RSV testing.  Fact Sheet for Patients: EntrepreneurPulse.com.au  Fact Sheet for Healthcare Providers: IncredibleEmployment.be  This test is not yet approved or cleared by the [place] FDA and has been authorized for detection and/or diagnosis of SARS-CoV-2 by FDA under an Emergency Use Authorization (EUA). This EUA will remain in effect (meaning this test can be used) for the duration of the COVID-19 declaration under Section 564(b)(1) of the Act, 21 U.S.C. section 360bbb-3(b)(1), unless the authorization is terminated or revoked.  Performed at [facility], [place] [address], [place], [place] [address]     RADIOLOGY STUDIES/RESULTS: No results found.   LOS: 3 days   Oren Binet, MD  [facility]    To contact the attending provider between 7A-7P or the covering provider during after hours 7P-7A, please log into the web site [email or web address] and access using universal [place] password for that  web site. If you do not have the password, please call the hospital operator.  05/19/2021, 11:05 AM

## 2021-05-19 NOTE — Progress Notes (Signed)
[facility] Progress Note  Name: Kathleen [name] MRN: [id number] DOB: 1960-08-01   Subjective: pt seen while on HD, c/o L wrist pain. Says "they stopped all my medications in dialysis" a few mos ago, she is not sure of the medication names.   ------------- Background on consult:  Kathleen [name] is a 61 y.o. female with a history of ESRD on HD, COPD, HTN, migraine, lupus, and hx of seizures who presented to the hospital with weakness. She states that she was too weak to go to her last dialysis treatment and the ER has observed that she's too weak to get off of the bed.  Nephrology is consulted for assistance with management of HD.     Intake/Output Summary (Last 24 hours) at 05/19/2021 1554 Last data filed at 05/19/2021 1230 Gross per 24 hour  Intake --  Output 0 ml  Net 0 ml     Vitals:  Vitals:   05/19/21 1100 05/19/21 1130 05/19/21 1200 05/19/21 1230  BP: 131/83 132/82 140/76 126/80  Pulse: 91 85 89 88  Resp: 19 20 16 16   Temp:    97.9 F (36.6 C)  TempSrc:    Temporal  SpO2:    99%  Weight:    48 kg     Physical Exam:   alert, nad , small-framed, no distress, pleasant  no jvd  Chest cta bilat  Cor reg no RG  Abd soft ntnd no ascites   Ext no LE edema, L wrist tender, mild edema   Alert, NF, ox3    LUE AVG+bruit   [name]   3h 82min  50.8kg 350/1.5  2/2 bath F160  AVG    - last post weight 1/3 was 48.2 kg  - sensipar 60 mg three times a week  - hectorol 6 mcg IV tiw  - venofer 100 mg weekly  - mircera 75 mcg q 2wks last 12/20, held 1/3 due to [facility]    CXR 1/06 - IMPRESSION: No active cardiopulmonary disease.    Assessment/Plan: Generalized weakness - felt secondary to covid, +esrd, multifactorial.  Covid PNA  - therapies per primary team  ESRD - usually on TTS schedule. Missed HD prior to admission. Had HD here on 1/08 and is getting HD today on schedule.  BP/ volume - BP's are wnl, she is 2 kg under dry wt, losing body wt.  Keeping even w HD today. Euvolemic on exam. CXR neg.  GI bleed - per primary team. Normal admit Hb > 10. Follow.   Anemia ckd - now + acute blood loss but Hb above goal. Will defer ESA, note recent OP dose was held as well.  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 Cont renvela w/ meals.  Disposition - per pmd  [name], MD 05/19/2021, 3:54 PM

## 2021-05-20 ENCOUNTER — Inpatient Hospital Stay (HOSPITAL_COMMUNITY): Payer: Medicare Other

## 2021-05-20 LAB — BLOOD CULTURE ID PANEL (REFLEXED) - BCID2

## 2021-05-20 LAB — CBC
HCT: 38 % (ref 36.0–46.0)
Hemoglobin: 12.1 g/dL (ref 12.0–15.0)
MCH: 28.2 pg (ref 26.0–34.0)
MCHC: 31.8 g/dL (ref 30.0–36.0)
MCV: 88.6 fL (ref 80.0–100.0)
Platelets: 163 10*3/uL (ref 150–400)
RBC: 4.29 MIL/uL (ref 3.87–5.11)
RDW: 18.3 % — ABNORMAL HIGH (ref 11.5–15.5)
WBC: 13.8 10*3/uL — ABNORMAL HIGH (ref 4.0–10.5)
nRBC: 0 % (ref 0.0–0.2)

## 2021-05-20 LAB — MAGNESIUM: Magnesium: 2.1 mg/dL (ref 1.7–2.4)

## 2021-05-20 LAB — RENAL FUNCTION PANEL
Albumin: 2.6 g/dL — ABNORMAL LOW (ref 3.5–5.0)
Anion gap: 15 (ref 5–15)
BUN: 33 mg/dL — ABNORMAL HIGH (ref 6–20)
CO2: 26 mmol/L (ref 22–32)
Calcium: 7.2 mg/dL — ABNORMAL LOW (ref 8.9–10.3)
Chloride: 94 mmol/L — ABNORMAL LOW (ref 98–111)
Creatinine, Ser: 7.21 mg/dL — ABNORMAL HIGH (ref 0.44–1.00)
GFR, Estimated: 6 mL/min — ABNORMAL LOW (ref >60)
Glucose, Bld: 105 mg/dL — ABNORMAL HIGH (ref 70–99)
Phosphorus: 2.8 mg/dL (ref 2.5–4.6)
Potassium: 3.9 mmol/L (ref 3.5–5.1)
Sodium: 135 mmol/L (ref 135–145)

## 2021-05-20 LAB — SEDIMENTATION RATE: Sed Rate: 64 mm/hr — ABNORMAL HIGH (ref 0–22)

## 2021-05-20 LAB — C4 COMPLEMENT: Complement C4, Body Fluid: 15 mg/dL (ref 12–38)

## 2021-05-20 LAB — ANTI-DNA ANTIBODY, DOUBLE-STRANDED: ds DNA Ab: 1 IU/mL (ref 0–9)

## 2021-05-20 LAB — C3 COMPLEMENT: C3 Complement: 103 mg/dL (ref 82–167)

## 2021-05-20 MED ORDER — SODIUM CHLORIDE 0.9 % IV SOLN
2.0000 g | Freq: Every day | INTRAVENOUS | Status: DC
  Administered 2021-05-20 – 2021-05-22 (×3): 2 g via INTRAVENOUS
  Filled 2021-05-20 (×2): qty 20

## 2021-05-20 MED ORDER — POTASSIUM CHLORIDE CRYS ER 20 MEQ PO TBCR
20.0000 meq | EXTENDED_RELEASE_TABLET | Freq: Once | ORAL | Status: AC
Start: 2021-05-20 — End: 2021-05-20
  Administered 2021-05-20: 20 meq via ORAL
  Filled 2021-05-20: qty 1

## 2021-05-20 MED ORDER — IOHEXOL 300 MG/ML  SOLN
100.0000 mL | Freq: Once | INTRAMUSCULAR | Status: AC | PRN
  Administered 2021-05-20: 100 mL via INTRAVENOUS

## 2021-05-20 NOTE — Plan of Care (Signed)

## 2021-05-20 NOTE — Progress Notes (Signed)
1/11 Mailed to patient's address due to patient's isolation status.

## 2021-05-20 NOTE — Progress Notes (Signed)
PHARMACY - PHYSICIAN COMMUNICATION CRITICAL VALUE ALERT - BLOOD CULTURE IDENTIFICATION (BCID)  Kathleen [name] is an 61 y.o. female who presented to [facility] on 05/15/2021 with a chief complaint of weakness/fever, possible sepsis  Assessment:  2/2 blood cultures growing Streptococcus pneumoniae  Name of physician (or Provider) Contacted: Dr. Cyd Silence  Current antibiotics: None  Changes to prescribed antibiotics recommended:  Start Rocephin 2 g IV q24h  Results for orders placed or performed during the hospital encounter of 05/15/21  Blood Culture ID Panel (Reflexed) (Collected: 05/20/2021  8:29 AM)  Result Value Ref Range   Enterococcus faecalis NOT DETECTED NOT DETECTED   Enterococcus Faecium NOT DETECTED NOT DETECTED   Listeria monocytogenes NOT DETECTED NOT DETECTED   Staphylococcus species NOT DETECTED NOT DETECTED   Staphylococcus aureus (BCID) NOT DETECTED NOT DETECTED   Staphylococcus epidermidis NOT DETECTED NOT DETECTED   Staphylococcus lugdunensis NOT DETECTED NOT DETECTED   Streptococcus species DETECTED (A) NOT DETECTED   Streptococcus agalactiae NOT DETECTED NOT DETECTED   Streptococcus pneumoniae DETECTED (A) NOT DETECTED   Streptococcus pyogenes NOT DETECTED NOT DETECTED   A.calcoaceticus-baumannii NOT DETECTED NOT DETECTED   Bacteroides fragilis NOT DETECTED NOT DETECTED   Enterobacterales NOT DETECTED NOT DETECTED   Enterobacter cloacae complex NOT DETECTED NOT DETECTED   Escherichia coli NOT DETECTED NOT DETECTED   Klebsiella aerogenes NOT DETECTED NOT DETECTED   Klebsiella oxytoca NOT DETECTED NOT DETECTED   Klebsiella pneumoniae NOT DETECTED NOT DETECTED   Proteus species NOT DETECTED NOT DETECTED   Salmonella species NOT DETECTED NOT DETECTED   Serratia marcescens NOT DETECTED NOT DETECTED   Haemophilus influenzae NOT DETECTED NOT DETECTED   Neisseria meningitidis NOT DETECTED NOT DETECTED   Pseudomonas aeruginosa NOT DETECTED NOT DETECTED    Stenotrophomonas maltophilia NOT DETECTED NOT DETECTED   Candida albicans NOT DETECTED NOT DETECTED   Candida auris NOT DETECTED NOT DETECTED   Candida glabrata NOT DETECTED NOT DETECTED   Candida krusei NOT DETECTED NOT DETECTED   Candida parapsilosis NOT DETECTED NOT DETECTED   Candida tropicalis NOT DETECTED NOT DETECTED   Cryptococcus neoformans/gattii NOT DETECTED NOT DETECTED    Caryl Pina 05/20/2021  11:02 PM

## 2021-05-20 NOTE — Progress Notes (Signed)
[facility] Progress Note  Name: Kathleen [name] MRN: [id number] DOB: [birth year]   Subjective: Patient not seen directly today given COVID-19 + status, utilizing data taken from chart +/- discussions w/ providers and staff.   ------------- Background on consult:  Kathleen [name] is a 61 y.o. female with a history of ESRD on HD, COPD, HTN, migraine, lupus, and hx of seizures who presented to the hospital with weakness. She states that she was too weak to go to her last dialysis treatment and the ER has observed that she's too weak to get off of the bed.  Nephrology is consulted for assistance with management of HD.    No intake or output data in the 24 hours ending 05/20/21 1617   Vitals:  Vitals:   05/19/21 1603 05/19/21 2128 05/20/21 0500 05/20/21 0820  BP: 118/88 123/77 103/72 105/81  Pulse: 84 67 68 67  Resp: 20 19 18 20   Temp: 98.7 F (37.1 C) (!) 100.8 F (38.2 C) (!) 100.4 F (38 C) 99.5 F (37.5 C)  TempSrc: Oral Oral Oral Oral  SpO2: 98% 100% 100% 98%  Weight:   49.2 kg      Physical Exam:   Patient not seen directly today given COVID-19 + status, utilizing data taken from chart +/- discussions w/ providers and staff.     [name]   3h 74min  50.8kg 350/1.5  2/2 bath F160  AVG    - last post weight 1/3 was 48.2 kg  - sensipar 60 mg three times a week  - hectorol 6 mcg IV tiw  - venofer 100 mg weekly  - mircera 75 mcg q 2wks last 12/20, held 1/3 due to [facility]    CXR 1/06 - IMPRESSION: No active cardiopulmonary disease.    Assessment/Plan: Generalized weakness - felt secondary to covid, +esrd, multifactorial.  Covid PNA  - therapies per primary team  ESRD - usually on TTS schedule. Missed HD prior to admission. Had HD here on 1/08 and 1/10. Next HD tomorrow.  BP/ volume - BP's are wnl, she is 2 kg under dry wt, losing body wt. Keeping even w HD today. Euvolemic on exam. CXR neg.  GI bleed - per primary team. Normal admit Hb > 10.  Follow.   Anemia ckd - now + acute blood loss but Hb above goal. Will defer ESA, note recent OP dose was held as well.  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 Cont renvela w/ meals.  Disposition - per pmd  [name], MD 05/20/2021, 4:17 PM

## 2021-05-20 NOTE — Care Management Important Message (Signed)
Important Message  Patient Details  Name: Kathleen [name] MRN: [id number] Date of Birth: 07-15-1960   Medicare Important Message Given:  Yes     Hannah Beat 05/20/2021, 12:14 PM

## 2021-05-20 NOTE — Progress Notes (Signed)
HOSPITAL MEDICINE OVERNIGHT EVENT NOTE    Notified by pharmacy that blood cultures drawn the morning of 1/11 have come back with 2 sets positive for strep pneumoniae.  Per pharmacy protocol patient has been initiated on ceftriaxone 2 g intravenously every 24 hours.  Vernelle Emerald  MD [facility]

## 2021-05-20 NOTE — Progress Notes (Signed)
Physical Therapy Treatment Patient Details Name: Kathleen [name] MRN: [id number] DOB: [birth year] Today's Date: 05/20/2021   History of Present Illness Pt is a 61 y.o. female who presented 05/15/21 with weakness. Pt with GI bleed. work up underway, but current diagnosis:  FTT, chronic combined systolic/diastolic HF, COVID -19  PMH: ESRD on HD, COPD, HTN, migraine, lupus, seziures, anemia, cardiac arrest hx, cellulitis, cerebral vasculitis, hx of suicide attempt, TB, TIA    PT Comments    Pt admitted with above diagnosis. Pt limited by pain today.  Pt was able to be assisted to chair but was max assist to scoot to chair and could not stand.  She c/o pain in her spine.  Will continue therapy as able.  Pt currently with functional limitations due to balance and endurance deficits. Pt will benefit from skilled PT to increase their independence and safety with mobility to allow discharge to the venue listed below.      Recommendations for follow up therapy are one component of a multi-disciplinary discharge planning process, led by the attending physician.  Recommendations may be updated based on patient status, additional functional criteria and insurance authorization.  Follow Up Recommendations  Skilled nursing-short term rehab (<3 hours/day)     Assistance Recommended at Discharge Intermittent Supervision/Assistance  Patient can return home with the following A little help with walking and/or transfers;A little help with bathing/dressing/bathroom;Assistance with cooking/housework;Assist for transportation;Help with stairs or ramp for entrance   Equipment Recommendations  Rolling walker (2 wheels);BSC/3in1    Recommendations for Other Services       Precautions / Restrictions Precautions Precautions: Fall;Other (comment) Precaution Comments: monitor vitals Restrictions Weight Bearing Restrictions: No     Mobility  Bed Mobility Overal bed mobility: Needs Assistance Bed Mobility:  Supine to Sit;Sit to Supine;Rolling Rolling: Mod assist;+2 for physical assistance   Supine to sit: HOB elevated;Max assist;+2 for safety/equipment     General bed mobility comments: Pt in pain on arrival.  Nurse was in room to give meds therefore PT assisted nurse with rolling so that nurse could place patch.  Nurse encouraged pt to get to EOB to take meds.    Transfers Overall transfer level: Needs assistance Equipment used: Rolling walker (2 wheels);2 person hand held assist;None Transfers: Sit to/from Stand;Bed to chair/wheelchair/BSC Sit to Stand: Total assist;From elevated surface          Lateral/Scoot Transfers: Max assist;From elevated surface General transfer comment: Pt could not stand up even with total assist.  Pain in spine too great per pt.  Was able to assist pt scoot transfer to chair with max assist to scoot.    Ambulation/Gait               General Gait Details: Unable   Stairs             Wheelchair Mobility    Modified Rankin (Stroke Patients Only)       Balance Overall balance assessment: Needs assistance Sitting-balance support: No upper extremity supported;Feet supported Sitting balance-Leahy Scale: Fair Sitting balance - Comments: Static sitting EOB with min guard for safety, body shaking at times   Standing balance support: Bilateral upper extremity supported;During functional activity Standing balance-Leahy Scale: Zero Standing balance comment: Pt could not stand to her feet today.                            Cognition Arousal/Alertness: Awake/alert Behavior During Therapy: WFL for tasks assessed/performed Overall  Cognitive Status: Within Functional Limits for tasks assessed                                 General Comments: Pt having difficulty follwoing commands with delayed response time as well.  Unsure if pt understanding as well.        Exercises General Exercises - Lower Extremity Heel  Slides: AAROM;PROM;Both;5 reps;Supine    General Comments        Pertinent Vitals/Pain Pain Assessment: Faces Faces Pain Scale: Hurts worst Pain Location: back/spine Pain Descriptors / Indicators: Grimacing;Guarding;Sharp Pain Intervention(s): Limited activity within patient's tolerance;Monitored during session;Repositioned;Patient requesting pain meds-RN notified;RN gave pain meds during session    Home Living                          Prior Function            PT Goals (current goals can now be found in the care plan section) Acute Rehab PT Goals Patient Stated Goal: to find a new home Progress towards PT goals: Progressing toward goals    Frequency    Min 2X/week      PT Plan Current plan remains appropriate    Co-evaluation              AM-PAC PT "6 Clicks" Mobility   Outcome Measure  Help needed turning from your back to your side while in a flat bed without using bedrails?: A Lot Help needed moving from lying on your back to sitting on the side of a flat bed without using bedrails?: A Lot Help needed moving to and from a bed to a chair (including a wheelchair)?: Total Help needed standing up from a chair using your arms (e.g., wheelchair or bedside chair)?: Total Help needed to walk in hospital room?: Total Help needed climbing 3-5 steps with a railing? : Total 6 Click Score: 8    End of Session Equipment Utilized During Treatment: Oxygen Activity Tolerance: Patient limited by fatigue;Patient limited by pain Patient left: with call bell/phone within reach;with chair alarm set;in chair Nurse Communication: Mobility status PT Visit Diagnosis: Unsteadiness on feet (R26.81);Other abnormalities of gait and mobility (R26.89);Muscle weakness (generalized) (M62.81);History of falling (Z91.81);Difficulty in walking, not elsewhere classified (R26.2);Pain Pain - part of body:  (abdomen)     Time: 5366-4403 PT Time Calculation (min) (ACUTE ONLY): 27  min  Charges:  $Therapeutic Exercise: 8-22 mins $Therapeutic Activity: 8-22 mins                     Rubylee Zamarripa M,PT Acute Rehab Services [phone] (pager)    Bevelyn Buckles 05/20/2021, 11:34 AM

## 2021-05-20 NOTE — Progress Notes (Addendum)
PROGRESS NOTE        PATIENT DETAILS Name: Kathleen [name] Age: 61 y.o. Sex: female Date of Birth: 1960/06/07 Admit Date: 05/15/2021 Admitting Physician Orma Flaming, MD QRF:XJOIT, Beverly Gust, NP  Brief Narrative: Patient is a 61 y.o. female with history of ESRD on HD TTS, HFrEF, SLE, anemia due to CKD, HTN-who presented with at least 1 week history of worsening weakness, intermittent diarrhea, hematochezia-due to weakness-she missed her last 3 HD sessions-she was found to have COVID-19 infection and subsequently admitted to the hospitalist service.  Subjective: Continues to have pain in the left wrist area (looks like she did not get her prednisone dosage yesterday).  Some mild swelling has developed on her dorsum of her left wrist.  Had 1 loose stools yesterday-no blood.  Claims all of my "bones" hurt-but left wrist is the worst.  Objective: Vitals: Blood pressure 105/81, pulse 67, temperature 99.5 F (37.5 C), temperature source Oral, resp. rate 20, weight 49.2 kg, SpO2 98 %.   Exam: Gen Exam:Alert awake-not in any distress HEENT:atraumatic, normocephalic Chest: B/L clear to auscultation anteriorly CVS:S1S2 regular Abdomen:soft non tender, non distended Extremities:no edema.  Mild swelling-very tender left wrist. Neurology: Non focal Skin: no rash   Pertinent Labs/Radiology: Recent Labs  Lab 05/15/21 1549 05/15/21 2301 05/20/21 0142 05/20/21 0815  WBC 13.6*   < >  --  13.8*  HGB 15.7*   < >  --  12.1  PLT 150   < >  --  163  NA 135   < > 135  --   K 3.6   < > 3.9  --   CREATININE 17.96*   < > 7.21*  --   AST 43*  --   --   --   ALT 12  --   --   --   ALKPHOS 66  --   --   --   BILITOT 0.7  --   --   --    < > = values in this interval not displayed.     Assessment/Plan: COVID-19 infection: Improved-diarrhea has resolved-no PNA on CXR-completed 3 days of Remdesivir   Diarrhea with intermittent hematochezia: Diarrhea due to COVID-19  infection-suspect hematochezia was in the setting of some mucosal sloughing.  Hematochezia has resolved-diarrhea is significantly improved.  Even though she had hematochezia-hemoglobin is stable.  Given significant debility/deconditioning and improving lethargy-suspect we can postpone endoscopic evaluation to the outpatient setting.  Discussed this with patient-she agrees.    Acute metabolic encephalopathy: Due to uremia in the setting of missed HD with some contribution from COVID-19.  Significantly better-much more awake and alert after treatment of underlying COVID Remdesivir and hemodialysis.    Generalized arthralgias-left wrist tenderness: Low-grade fever overnight-continues to have significant tenderness in the left wrist.  Obtain plain x-ray-blood cultures.  Unclear if this is from underlying lupus or from a developing infection.  Prednisone was ordered yesterday-does not look like she got a dose- will reassess if there is any improvement with prednisone.  If fever worsens-or if any other signs of infection develop-May need to be started on antibiotics.  Addendum 1:40 PM-discussed with orthopedics-initially recommendations were for MRI with and without contrast but given renal function-recommendations are to pursue CT with IV contrast.  If has a significant effusion-will require arthrocentesis.  We will follow closely.   ESRD: Nephrology following-we  will defer hemodialysis to nephrology service.  Hypokalemia: Repleted  Chronic systolic heart failure (EF 45-50% on 03/13/2021): Volume status is stable.  Watch closely.  SLE: No longer on Plaquenil-Per patient-this was discontinued several months ago by her nephrologist.  She is now developing some generalized arthralgias-worse in her left wrist area (does not appear infected).  Await dsDNA/complement levels/ESR.  Given issues with QTC prolongation-May be prudent to continue to hold Plaquenil-see above regarding steroids.  HTN: BP now slowly  creeping up-suspect we can still continue to monitor without the use of antihypertensives.  Prolonged QTC: Appears to have develops some amount of QTC elongation over the past few months-repeat EKG this morning shows significant improvement.  We will attempt to keep K> 4, and Mg>2.  Monitor EKGs periodically-continue telemetry monitoring.    Tobacco abuse: Continue transdermal nicotine  Deconditioning/debility: Likely due to COVID-19 infection-and worsening uremia from missed HD.  Hopefully will continue to improve with further hemodialysis-improvement in underlying COVID-19 infection.  Evaluated by PT services-felt to require SNF on discharge.  Social worker following.   BMI Estimated body mass index is 18.62 kg/m as calculated from the following:   Height as of 03/12/21: _0  (1.626 m).   Weight as of this encounter: 49.2 kg.    Procedures: None Consults: None DVT Prophylaxis: SQ heparin Code Status:Full code  Family Communication: None at bedside  Disposition Plan: Status is: Inpatient  Severe debility/deconditioning-now with worsening encephalopathy due to uremia-needs hemodialysis today.  On IV Remdesivir for COVID.  SNF planned on discharge.  Diet: Diet Order             Diet renal with fluid restriction Fluid restriction: 1200 mL Fluid; Room service appropriate? Yes; Fluid consistency: Thin  Diet effective now                     Antimicrobial agents: Anti-infectives (From admission, onward)    Start     Dose/Rate Route Frequency Ordered Stop   05/17/21 1000  remdesivir 100 mg in sodium chloride 0.9 % 100 mL IVPB       See Hyperspace for full Linked Orders Report.   100 mg 200 mL/hr over 30 Minutes Intravenous Daily 05/16/21 0847 05/18/21 0857   05/16/21 1000  remdesivir 200 mg in sodium chloride 0.9% 250 mL IVPB       See Hyperspace for full Linked Orders Report.   200 mg 580 mL/hr over 30 Minutes Intravenous Once 05/16/21 0847 05/16/21 1043         MEDICATIONS: Scheduled Meds:  Chlorhexidine Gluconate Cloth  6 each Topical Q0600   cinacalcet  60 mg Oral Once per day on Mon Wed Fri   cinacalcet  60 mg Oral Q T,Th,Sa-HD   famotidine  20 mg Oral Daily   feeding supplement (NEPRO CARB STEADY)  237 mL Oral TID BM   heparin injection (subcutaneous)  5,000 Units Subcutaneous Q8H   multivitamin  1 tablet Oral QHS   nicotine  14 mg Transdermal Daily   potassium chloride  20 mEq Oral Once   predniSONE  40 mg Oral Q breakfast   sevelamer carbonate  1,600 mg Oral TID WC   sodium chloride flush  3 mL Intravenous Q12H   Continuous Infusions:  sodium chloride     PRN Meds:.sodium chloride, acetaminophen **OR** acetaminophen, oxyCODONE, sodium chloride flush   I have personally reviewed following labs and imaging studies  LABORATORY DATA: CBC: Recent Labs  Lab 05/15/21 1549 05/15/21 2301 05/16/21  0900 05/17/21 0313 05/18/21 0239 05/19/21 0634 05/20/21 0815  WBC 13.6*   < > 9.5 9.2 8.9 9.4 13.8*  NEUTROABS 11.7*  --   --   --   --   --   --   HGB 15.7*   < > 14.9 14.2 12.8 11.5* 12.1  HCT 50.4*   < > 48.8* 44.1 38.2 35.1* 38.0  MCV 92.8   < > 95.5 90.7 87.0 88.2 88.6  PLT 150   < > 140* 151 164 171 163   < > = values in this interval not displayed.     Basic Metabolic Panel: Recent Labs  Lab 05/15/21 1549 05/16/21 0157 05/17/21 0313 05/18/21 0239 05/19/21 0634 05/20/21 0142  NA 135 138 133* 136 133* 135  K 3.6 3.9 3.6 3.2* 3.7 3.9  CL 93* 95* 91* 95* 96* 94*  CO2 19* 15* 14* 23 19* 26  GLUCOSE 115* 92 68* 92 121* 105*  BUN 65* 76* 96* 46* 81* 33*  CREATININE 17.96* 18.80* 19.68* 10.84* 13.00* 7.21*  CALCIUM 8.9 8.7* 7.5* 7.9* 6.9* 7.2*  MG 2.9*  --  2.5*  --   --  2.1  PHOS  --   --  8.2* 4.6 4.0 2.8     GFR: Estimated Creatinine Clearance: 6.4 mL/min (A) (by C-G formula based on SCr of 7.21 mg/dL (H)).  Liver Function Tests: Recent Labs  Lab 05/15/21 1549 05/17/21 0313 05/18/21 0239  05/19/21 0634 05/20/21 0142  AST 43*  --   --   --   --   ALT 12  --   --   --   --   ALKPHOS 66  --   --   --   --   BILITOT 0.7  --   --   --   --   PROT 9.1*  --   --   --   --   ALBUMIN 4.0 3.3* 2.7* 2.6* 2.6*    No results for input(s): LIPASE, AMYLASE in the last 168 hours. Recent Labs  Lab 05/15/21 1550  AMMONIA 28     Coagulation Profile: No results for input(s): INR, PROTIME in the last 168 hours.  Cardiac Enzymes: No results for input(s): CKTOTAL, CKMB, CKMBINDEX, TROPONINI in the last 168 hours.  BNP (last 3 results) No results for input(s): PROBNP in the last 8760 hours.  Lipid Profile: No results for input(s): CHOL, HDL, LDLCALC, TRIG, CHOLHDL, LDLDIRECT in the last 72 hours.  Thyroid Function Tests: No results for input(s): TSH, T4TOTAL, FREET4, T3FREE, THYROIDAB in the last 72 hours.   Anemia Panel: No results for input(s): VITAMINB12, FOLATE, FERRITIN, TIBC, IRON, RETICCTPCT in the last 72 hours.  Urine analysis:    Component Value Date/Time   COLORURINE YELLOW 05/15/2021 2252   APPEARANCEUR CLEAR 05/15/2021 2252   LABSPEC 1.020 05/15/2021 2252   PHURINE 6.0 05/15/2021 2252   GLUCOSEU NEGATIVE 05/15/2021 2252   HGBUR NEGATIVE 05/15/2021 2252   BILIRUBINUR NEGATIVE 05/15/2021 2252   KETONESUR NEGATIVE 05/15/2021 2252   PROTEINUR NEGATIVE 05/15/2021 2252   NITRITE NEGATIVE 05/15/2021 2252   LEUKOCYTESUR NEGATIVE 05/15/2021 2252    Sepsis Labs: Lactic Acid, Venous    Component Value Date/Time   LATICACIDVEN 1.40 07/11/2017 0545    MICROBIOLOGY: Recent Results (from the past 240 hour(s))  Resp Panel by RT-PCR (Flu A&B, Covid) Nasopharyngeal Swab     Status: Abnormal   Collection Time: 05/15/21  3:26 PM   Specimen: Nasopharyngeal Swab; Nasopharyngeal(NP) swabs in  vial transport medium  Result Value Ref Range Status   SARS Coronavirus 2 by RT PCR POSITIVE (A) NEGATIVE Final    Comment: (NOTE) SARS-CoV-2 target nucleic acids are  DETECTED.  The SARS-CoV-2 RNA is generally detectable in upper respiratory specimens during the acute phase of infection. Positive results are indicative of the presence of the identified virus, but do not rule out bacterial infection or co-infection with other pathogens not detected by the test. Clinical correlation with patient history and other diagnostic information is necessary to determine patient infection status. The expected result is Negative.  Fact Sheet for Patients: EntrepreneurPulse.com.au  Fact Sheet for Healthcare Providers: IncredibleEmployment.be  This test is not yet approved or cleared by the [place] FDA and  has been authorized for detection and/or diagnosis of SARS-CoV-2 by FDA under an Emergency Use Authorization (EUA).  This EUA will remain in effect (meaning this test can be used) for the duration of  the COVID-19 declaration under Section 564(b)(1) of the A ct, 21 U.S.C. section 360bbb-3(b)(1), unless the authorization is terminated or revoked sooner.     Influenza A by PCR NEGATIVE NEGATIVE Final   Influenza B by PCR NEGATIVE NEGATIVE Final    Comment: (NOTE) The Xpert Xpress SARS-CoV-2/FLU/RSV plus assay is intended as an aid in the diagnosis of influenza from Nasopharyngeal swab specimens and should not be used as a sole basis for treatment. Nasal washings and aspirates are unacceptable for Xpert Xpress SARS-CoV-2/FLU/RSV testing.  Fact Sheet for Patients: EntrepreneurPulse.com.au  Fact Sheet for Healthcare Providers: IncredibleEmployment.be  This test is not yet approved or cleared by the [place] FDA and has been authorized for detection and/or diagnosis of SARS-CoV-2 by FDA under an Emergency Use Authorization (EUA). This EUA will remain in effect (meaning this test can be used) for the duration of the COVID-19 declaration under Section 564(b)(1) of the Act, 21  U.S.C. section 360bbb-3(b)(1), unless the authorization is terminated or revoked.  Performed at [facility], [place] [address] [place], [place] [address]     RADIOLOGY STUDIES/RESULTS: DG Wrist 2 Views Left  Result Date: 05/20/2021 CLINICAL DATA:  Swelling EXAM: LEFT WRIST - 2 VIEW COMPARISON:  None. FINDINGS: There is no evidence of fracture or dislocation. There is no evidence of arthropathy or other focal bone abnormality. Soft tissues are unremarkable. IMPRESSION: Negative. Electronically Signed   By: Macy Mis M.D.   On: 05/20/2021 09:30     LOS: 4 days   Oren Binet, MD  [facility]    To contact the attending provider between 7A-7P or the covering provider during after hours 7P-7A, please log into the web site [email or web address] and access using universal [place] password for that web site. If you do not have the password, please call the hospital operator.  05/20/2021, 10:24 AM

## 2021-05-21 ENCOUNTER — Inpatient Hospital Stay (HOSPITAL_COMMUNITY): Payer: Medicare Other

## 2021-05-21 DIAGNOSIS — R7881 Bacteremia: Secondary | ICD-10-CM

## 2021-05-21 DIAGNOSIS — R627 Adult failure to thrive: Secondary | ICD-10-CM

## 2021-05-21 LAB — ECHOCARDIOGRAM COMPLETE
AR max vel: 2.64 cm2
AV Peak grad: 7.3 mmHg
Ao pk vel: 1.36 m/s
Area-P 1/2: 3.28 cm2
P 1/2 time: 384 msec
S' Lateral: 3.2 cm
Weight: 1689.61 oz

## 2021-05-21 LAB — CBC
HCT: 32.6 % — ABNORMAL LOW (ref 36.0–46.0)
Hemoglobin: 10.7 g/dL — ABNORMAL LOW (ref 12.0–15.0)
MCH: 27.9 pg (ref 26.0–34.0)
MCHC: 32.8 g/dL (ref 30.0–36.0)
MCV: 85.1 fL (ref 80.0–100.0)
Platelets: 179 10*3/uL (ref 150–400)
RBC: 3.83 MIL/uL — ABNORMAL LOW (ref 3.87–5.11)
RDW: 18.5 % — ABNORMAL HIGH (ref 11.5–15.5)
WBC: 17.5 10*3/uL — ABNORMAL HIGH (ref 4.0–10.5)
nRBC: 0 % (ref 0.0–0.2)

## 2021-05-21 LAB — RENAL FUNCTION PANEL
Albumin: 2.3 g/dL — ABNORMAL LOW (ref 3.5–5.0)
Anion gap: 16 — ABNORMAL HIGH (ref 5–15)
BUN: 55 mg/dL — ABNORMAL HIGH (ref 6–20)
CO2: 26 mmol/L (ref 22–32)
Calcium: 7.3 mg/dL — ABNORMAL LOW (ref 8.9–10.3)
Chloride: 89 mmol/L — ABNORMAL LOW (ref 98–111)
Creatinine, Ser: 9.42 mg/dL — ABNORMAL HIGH (ref 0.44–1.00)
GFR, Estimated: 4 mL/min — ABNORMAL LOW (ref >60)
Glucose, Bld: 110 mg/dL — ABNORMAL HIGH (ref 70–99)
Phosphorus: 4.3 mg/dL (ref 2.5–4.6)
Potassium: 3.8 mmol/L (ref 3.5–5.1)
Sodium: 131 mmol/L — ABNORMAL LOW (ref 135–145)

## 2021-05-21 LAB — MAGNESIUM: Magnesium: 2.4 mg/dL (ref 1.7–2.4)

## 2021-05-21 MED ORDER — POTASSIUM CHLORIDE CRYS ER 20 MEQ PO TBCR
20.0000 meq | EXTENDED_RELEASE_TABLET | Freq: Once | ORAL | Status: AC
  Administered 2021-05-21: 20 meq via ORAL
  Filled 2021-05-21: qty 1

## 2021-05-21 MED ORDER — ONDANSETRON HCL 4 MG/2ML IJ SOLN
4.0000 mg | Freq: Once | INTRAMUSCULAR | Status: AC
  Administered 2021-05-21: 4 mg via INTRAVENOUS
  Filled 2021-05-21: qty 2

## 2021-05-21 MED ORDER — LORAZEPAM 2 MG/ML IJ SOLN: 0.5000 mg | Freq: Once | INTRAMUSCULAR | Status: DC | PRN

## 2021-05-21 NOTE — Consult Note (Addendum)
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   # Strep pneumoniae bacteremia #COVID infection SP remdesivir x 3days #ESRD on HD -Pt has a productive cough, suspect that she has superimposed pneumonia leading to bacteremia. Although CXR today did not show consolidation and pt is no RA. Will order sputum Cx. Will need repeat blood Cx to ensure clearance. As she is an HD pt would get TTE to evaluate for endocarditis.    #SLE with non adherence to plaquenil #Left wrist pain/tenderness #LUE AVF -Clinically wrist pain seems consistent with a lupus arthritis/flare rather than cellulitis.  Pt reprots she has wrist pain for "a while". The CT w/ contrast of left wrist did not show an effusion or signs of infection. Would like to ultrasound the wrist.  -In addition, Per Dr. Karl Pock note([facility]) on 03/02/2018 pt had been non-adherent to plaquinil. Following this visit there were several missed appt/no shows. As such I think wrist arthralgia is c/w SLE flare.   Recommendations:  -Sputum Cx -Left  wrist ultrasound  -Continue ceftriaxone -Consider starting prednisone for lupus flare -Follow-up back imaging-pt had reported back pain -Repeat blood Cx -Follow-up TTE Microbiology:   Antibiotics: Ceftriaxone 1/11-p  Cultures: Blood 1/11  strep pneumoniae   HPI: Kathleen [name] is a 61 y.o. female with ESRD on HD via LAVF, CHF, SLE, anemia, HTN admitted for failure to thrive and rectal bleeding. Pt had decreased PO intake x 3 weeks and rectal bleeding 3-5 days. She denied fever/chills. Pt afebrile on admission. Wbc 13.6K. CXR showed no active disease and CT abdomen pelvis showed no acute process. She had found to have COVID + and received remdesivir x 3 days. She developed fever and blood Cx+ Strep pneumoniae. Pt started on ceftriaxone and ID engaged.  Today, pt reports a productive cough. Denies fever, chills. She states her hand is till tender.   Review of Systems: Review of Systems  All other systems reviewed and are negative.  Past Medical History:  Diagnosis Date   Anemia    Arthritis    Cardiac arrest ([place])    Cellulitis    Cerebral vasculitis    COPD (chronic obstructive pulmonary disease) (HCC)    Elevated antinuclear antibody (ANA) level    Endometriosis    ESRD (end stage renal disease) on dialysis (HCC)    Gallstones    Hypertension    Lupus (HCC)    Migraine    PONV (postoperative nausea and vomiting)    Seizures ([place])    Sepsis ([place])    Sleep apnea    Suicide attempt ([place])    TB (tuberculosis)    Transient ischemic attack (TIA)    Vasculitis of skin     Social History  Tobacco Use   Smoking status: Some Days    Packs/day: 0.25    Types: Cigarettes   Smokeless tobacco: Never  Vaping Use   Vaping Use: Never used  Substance Use Topics   Alcohol use: No   Drug use: No    Family History  Problem Relation Age of Onset   Ovarian cancer Mother    Breast cancer Sister    Throat cancer Brother    Scheduled Meds:  Chlorhexidine Gluconate Cloth  6 each Topical Q0600   cinacalcet  60 mg Oral Once per day on Mon Wed Fri   cinacalcet  60 mg Oral Q T,Th,Sa-HD   famotidine  20 mg Oral Daily   feeding supplement (NEPRO CARB STEADY)  237 mL Oral TID BM   heparin injection (subcutaneous)   5,000 Units Subcutaneous Q8H   multivitamin  1 tablet Oral QHS   nicotine  14 mg Transdermal Daily   ondansetron (ZOFRAN) IV  4 mg Intravenous Once   predniSONE  40 mg Oral Q breakfast   sevelamer carbonate  1,600 mg Oral TID WC   sodium chloride flush  3 mL Intravenous Q12H   Continuous Infusions:  sodium chloride     cefTRIAXone (ROCEPHIN)  IV 2 g (05/20/21 2341)   PRN Meds:.sodium chloride, acetaminophen **OR** acetaminophen, LORazepam, oxyCODONE, sodium chloride flush No Known Allergies  OBJECTIVE: Blood pressure 124/76, pulse 98, temperature 98.3 F (36.8 C), temperature source Oral, resp. rate 18, weight 47.9 kg, SpO2 100 %.  Physical Exam Constitutional:      Appearance: Normal appearance.  HENT:     Head: Normocephalic and atraumatic.     Right Ear: Tympanic membrane normal.     Left Ear: Tympanic membrane normal.     Nose: Nose normal.     Mouth/Throat:     Mouth: Mucous membranes are moist.  Eyes:     Extraocular Movements: Extraocular movements intact.     Conjunctiva/sclera: Conjunctivae normal.     Pupils: Pupils are equal, round, and reactive to light.  Cardiovascular:     Rate and Rhythm: Normal rate and regular rhythm.     Heart sounds: No murmur heard.   No friction rub. No gallop.     Comments: K upper arm AVF Pulmonary:     Effort: Pulmonary effort is normal.     Breath sounds: Normal breath sounds.  Abdominal:     General: Abdomen is flat.     Palpations: Abdomen is soft.  Musculoskeletal:     Comments: Right wrist exquisitely tender to palpation, she is not able to move her wrist. No erythema/edema noted  Skin:    General: Skin is warm and dry.  Neurological:     General: No focal deficit present.     Mental Status: She is alert and oriented to person, place, and time.  Psychiatric:        Mood and Affect: Mood normal.    Lab Results Lab Results  Component Value Date   WBC 17.5 (H) 05/21/2021   HGB 10.7 (L) 05/21/2021   HCT 32.6 (L)  05/21/2021   MCV 85.1 05/21/2021   PLT 179 05/21/2021    Lab Results  Component Value Date   CREATININE 9.42 (H) 05/21/2021   BUN 55 (H) 05/21/2021   NA 131 (L) 05/21/2021   K 3.8 05/21/2021   CL 89 (L) 05/21/2021   CO2 26 05/21/2021    Lab Results  Component Value Date   ALT 12 05/15/2021  AST 43 (H) 05/15/2021   ALKPHOS 66 05/15/2021   BILITOT 0.7 05/15/2021       Laurice Record, [facility] 05/21/2021, 11:41 AM

## 2021-05-21 NOTE — Progress Notes (Signed)
Physical Therapy Treatment Patient Details Name: Kathleen [name] MRN: [id number] DOB: November 01, 1960 Today's Date: 05/21/2021   History of Present Illness Pt is a 61 y.o. female who presented 05/15/21 with weakness. Pt with GI bleed. work up underway, but current diagnosis:  FTT, chronic combined systolic/diastolic HF, COVID -19  PMH: ESRD on HD, COPD, HTN, migraine, lupus, seziures, anemia, cardiac arrest hx, cellulitis, cerebral vasculitis, hx of suicide attempt, TB, TIA    PT Comments    Pt admitted with above diagnosis. Pt was able to ambulate in room with min assist and mod cues overall as she is unsteady at tiems and needs at least 1 UE support.  Pain in left UE.  Pt did better today but still needs SNF for therapy.  Pt currently with functional limitations due to balance and endurance deficits. Pt will benefit from skilled PT to increase their independence and safety with mobility to allow discharge to the venue listed below.      Recommendations for follow up therapy are one component of a multi-disciplinary discharge planning process, led by the attending physician.  Recommendations may be updated based on patient status, additional functional criteria and insurance authorization.  Follow Up Recommendations  Skilled nursing-short term rehab (<3 hours/day)     Assistance Recommended at Discharge Intermittent Supervision/Assistance  Patient can return home with the following A little help with walking and/or transfers;A little help with bathing/dressing/bathroom;Assistance with cooking/housework;Assist for transportation;Help with stairs or ramp for entrance   Equipment Recommendations  Rolling walker (2 wheels);BSC/3in1    Recommendations for Other Services       Precautions / Restrictions Precautions Precautions: Fall;Other (comment) Precaution Comments: monitor vitals Restrictions Weight Bearing Restrictions: No     Mobility  Bed Mobility Overal bed mobility: Needs  Assistance Bed Mobility: Supine to Sit;Sit to Supine;Rolling Rolling: Min assist   Supine to sit: Min assist Sit to supine: Min assist   General bed mobility comments: Pt needed only a little assist today to come to EOB.  Had to assist pt back to bed as they were coming to do procedure and wanted her in bed.    Transfers Overall transfer level: Needs assistance Equipment used: Rolling walker (2 wheels);2 person hand held assist;None Transfers: Sit to/from Stand;Bed to chair/wheelchair/BSC Sit to Stand: Min assist           General transfer comment: Pt stood up abruptly without PT realizing she was going to and was able to stand with min guard to min assist as she is unsteady slightly. Pt states her pain is much better today.Mod ccues for safety.     Ambulation/Gait Ambulation/Gait assistance: Min assist Gait Distance (Feet): 225 Feet Assistive device: Rolling walker (2 wheels);1 person hand held assist Gait Pattern/deviations: Step-to pattern;Decreased stride length;Shuffle;Staggering left;Staggering right;Drifts right/left Gait velocity: reduced Gait velocity interpretation: <1.31 ft/sec, indicative of household ambulator   General Gait Details: Pt was able to ambulate but could not use RW due to left UE pain. Pt held onto PT right Hand and was able to ambulate incr distance with unsteadiness on feet at times with definite need for HHA.  Overall pt doing much better with less pain in spine today per pt. Fatigued at end of walk. Needed mod cues for safety with RW.    Stairs             Wheelchair Mobility    Modified Rankin (Stroke Patients Only)       Balance Overall balance assessment: Needs assistance Sitting-balance support:  No upper extremity supported;Feet supported Sitting balance-Leahy Scale: Fair Sitting balance - Comments: Static sitting EOB with min guard for safety   Standing balance support: Bilateral upper extremity supported;During functional  activity Standing balance-Leahy Scale: Poor Standing balance comment: relies on at least 1 UE support.                            Cognition Arousal/Alertness: Awake/alert Behavior During Therapy: WFL for tasks assessed/performed Overall Cognitive Status: Within Functional Limits for tasks assessed                                 General Comments: Pt much different today and was following commands without difficulty. Pt did demonstrate impulsivity.        Exercises General Exercises - Lower Extremity Long Arc Quad: AROM;Both;10 reps;Seated Heel Slides: AAROM;PROM;Both;5 reps;Supine    General Comments        Pertinent Vitals/Pain Pain Assessment: Faces Faces Pain Scale: Hurts little more Pain Location: back/spine Pain Descriptors / Indicators: Grimacing;Guarding;Sharp Pain Intervention(s): Limited activity within patient's tolerance;Monitored during session;Repositioned    Home Living                          Prior Function            PT Goals (current goals can now be found in the care plan section) Acute Rehab PT Goals Patient Stated Goal: to find a new home Progress towards PT goals: Progressing toward goals    Frequency    Min 2X/week      PT Plan Current plan remains appropriate    Co-evaluation              AM-PAC PT "6 Clicks" Mobility   Outcome Measure  Help needed turning from your back to your side while in a flat bed without using bedrails?: A Lot Help needed moving from lying on your back to sitting on the side of a flat bed without using bedrails?: A Lot Help needed moving to and from a bed to a chair (including a wheelchair)?: A Lot Help needed standing up from a chair using your arms (e.g., wheelchair or bedside chair)?: A Little Help needed to walk in hospital room?: A Little Help needed climbing 3-5 steps with a railing? : Total 6 Click Score: 13    End of Session Equipment Utilized During  Treatment: Oxygen Activity Tolerance: Patient limited by fatigue;Patient limited by pain Patient left: with call bell/phone within reach;in bed;with bed alarm set Nurse Communication: Mobility status PT Visit Diagnosis: Unsteadiness on feet (R26.81);Other abnormalities of gait and mobility (R26.89);Muscle weakness (generalized) (M62.81);History of falling (Z91.81);Difficulty in walking, not elsewhere classified (R26.2);Pain Pain - part of body:  (abdomen)     Time: 7846-9629 PT Time Calculation (min) (ACUTE ONLY): 20 min  Charges:  $Gait Training: 8-22 mins                     Edilberto Roosevelt M,PT Acute Rehab Services [phone] (pager)    Bevelyn Buckles 05/21/2021, 1:18 PM

## 2021-05-21 NOTE — Progress Notes (Signed)
Contacted by CSW regarding pt's need for snf at d/c. Contacted [facility] and spoke to [place], [employer] manager, regarding pt's chair time at d/c due to covid diagnosis. As long as pt has not had fevers close to d/c date, pt should be able to resume regular schedule including times next Tuesday. Advised clinic that depending on snf that pt admits to, snf may request later time and/or different schedule. Will need to f/u with clinic tomorrow regarding pt's current medical status and snf prospects. Will follow and assist.   Melven Sartorius Renal Navigator [phone]

## 2021-05-21 NOTE — Progress Notes (Addendum)
[facility] Progress Note   Subjective: Patient not seen directly today given COVID-19 + status, utilizing data taken from chart +/- discussions w/ providers and staff.    HD today on schedule. ECHO in progress. BC positive for strep pneumoniae X 2 sets.   Objective Vitals:   05/20/21 1656 05/20/21 1953 05/21/21 0505 05/21/21 0907  BP: 111/79 111/74 (!) 138/94 124/76  Pulse: 84 78 98   Resp: 18 18 18 18   Temp: 97.9 F (36.6 C) 98.2 F (36.8 C) 98.1 F (36.7 C) 98.3 F (36.8 C)  TempSrc: Oral Oral Oral Oral  SpO2: 100% 100% 100% 100%  Weight:   47.9 kg    Physical Exam  Patient not seen directly today given COVID-19 + status, utilizing data taken from chart +/- discussions w/ providers and staff.  Dialysis Orders:  Additional Objective Labs: Basic Metabolic Panel: Recent Labs  Lab 05/19/21 0634 05/20/21 0142 05/21/21 0222  NA 133* 135 131*  K 3.7 3.9 3.8  CL 96* 94* 89*  CO2 19* 26 26  GLUCOSE 121* 105* 110*  BUN 81* 33* 55*  CREATININE 13.00* 7.21* 9.42*  CALCIUM 6.9* 7.2* 7.3*  PHOS 4.0 2.8 4.3   Liver Function Tests: Recent Labs  Lab 05/15/21 1549 05/17/21 0313 05/19/21 0634 05/20/21 0142 05/21/21 0222  AST 43*  --   --   --   --   ALT 12  --   --   --   --   ALKPHOS 66  --   --   --   --   BILITOT 0.7  --   --   --   --   PROT 9.1*  --   --   --   --   ALBUMIN 4.0   < > 2.6* 2.6* 2.3*   < > = values in this interval not displayed.   No results for input(s): LIPASE, AMYLASE in the last 168 hours. CBC: Recent Labs  Lab 05/15/21 1549 05/15/21 2301 05/17/21 0313 05/18/21 0239 05/19/21 0634 05/20/21 0815 05/21/21 0222  WBC 13.6*   < > 9.2 8.9 9.4 13.8* 17.5*  NEUTROABS 11.7*  --   --   --   --   --   --   HGB 15.7*   < > 14.2 12.8 11.5* 12.1 10.7*  HCT 50.4*   < > 44.1 38.2 35.1* 38.0 32.6*  MCV 92.8   < > 90.7 87.0 88.2 88.6 85.1  PLT 150   < > 151 164 171 163 179   < > = values in this interval not displayed.   Blood  Culture    Component Value Date/Time   SDES BLOOD RIGHT HAND 05/20/2021 0842   SPECREQUEST  05/20/2021 0842    BOTTLES DRAWN AEROBIC ONLY Blood Culture results may not be optimal due to an inadequate volume of blood received in culture bottles   CULT GRAM POSITIVE COCCI 05/20/2021 0842   REPTSTATUS PENDING 05/20/2021 0842    Cardiac Enzymes: No results for input(s): CKTOTAL, CKMB, CKMBINDEX, TROPONINI in the last 168 hours. CBG: Recent Labs  Lab 05/19/21 0654  GLUCAP 141*   Iron Studies: No results for input(s): IRON, TIBC, TRANSFERRIN, FERRITIN in the last 72 hours. @lablastinr3 @ Studies/Results: DG Wrist 2 Views Left  Result Date: 05/20/2021 CLINICAL DATA:  Swelling EXAM: LEFT WRIST - 2 VIEW COMPARISON:  None. FINDINGS: There is no evidence of fracture or dislocation. There is no evidence of arthropathy or other focal bone abnormality. Soft tissues  are unremarkable. IMPRESSION: Negative. Electronically Signed   By: Macy Mis M.D.   On: 05/20/2021 09:30   CT WRIST LEFT W CONTRAST  Result Date: 05/20/2021 CLINICAL DATA:  Left wrist pain. History of lupus. Evaluate for septic arthritis. EXAM: CT OF THE UPPER LEFT EXTREMITY WITH CONTRAST TECHNIQUE: Multidetector CT imaging of the left wrist was performed according to the standard protocol following intravenous contrast administration. RADIATION DOSE REDUCTION: This exam was performed according to the departmental dose-optimization program which includes automated exposure control, adjustment of the mA and/or kV according to patient size and/or use of iterative reconstruction technique. CONTRAST:  118mL OMNIPAQUE IOHEXOL 300 MG/ML  SOLN COMPARISON:  Left wrist x-rays from same day. FINDINGS: Bones/Joint/Cartilage No bony destruction or periosteal reaction. No fracture or dislocation. Joint spaces are preserved. No joint effusion. Ligaments Ligaments are suboptimally evaluated by CT. Muscles and Tendons Grossly intact. Soft tissue No  fluid collection or subcutaneous emphysema.  No soft tissue mass. IMPRESSION: 1. No acute osseous abnormality.  No joint effusion. Electronically Signed   By: Titus Dubin M.D.   On: 05/20/2021 16:06   DG Chest Port 1V same Day  Result Date: 05/21/2021 CLINICAL DATA:  Shortness of breath EXAM: PORTABLE CHEST 1 VIEW COMPARISON:  05/15/2021 FINDINGS: Artifact from EKG leads. Chronic cardiomegaly. Mild atelectatic type opacity at the bases. Paramediastinal bleb at the aortic arch. There is no edema, consolidation, effusion, or pneumothorax. IMPRESSION: Stable exam.  No acute finding. Electronically Signed   By: Jorje Guild M.D.   On: 05/21/2021 08:53   Medications:  sodium chloride     cefTRIAXone (ROCEPHIN)  IV 2 g (05/20/21 2341)    Chlorhexidine Gluconate Cloth  6 each Topical Q0600   cinacalcet  60 mg Oral Once per day on Mon Wed Fri   cinacalcet  60 mg Oral Q T,Th,Sa-HD   famotidine  20 mg Oral Daily   feeding supplement (NEPRO CARB STEADY)  237 mL Oral TID BM   heparin injection (subcutaneous)  5,000 Units Subcutaneous Q8H   multivitamin  1 tablet Oral QHS   nicotine  14 mg Transdermal Daily   ondansetron (ZOFRAN) IV  4 mg Intravenous Once   predniSONE  40 mg Oral Q breakfast   sevelamer carbonate  1,600 mg Oral TID WC   sodium chloride flush  3 mL Intravenous Q12H     OP HD: [name]   3h 53min  50.8kg 350/1.5  2/2 bath F160  AVG    - last post weight 1/3 was 48.2 kg  - sensipar 60 mg three times a week  - hectorol 6 mcg IV tiw  - venofer 100 mg weekly  - mircera 75 mcg q 2wks last 12/20, held 1/3 due to [facility]     CXR 1/06 - IMPRESSION: No active cardiopulmonary disease.    Background:  Kathleen [name] is a 61 y.o. female with a history of ESRD on HD, COPD, HTN, migraine, lupus, and hx of seizures who presented to the hospital with weakness. She states that she was too weak to go to her last dialysis treatment and the ER has observed that she's too weak to get off of  the bed.  Nephrology is consulted for assistance with management of HD.+ COVID 19.    Assessment/Plan: Generalized weakness - felt secondary to covid, +esrd, multifactorial.  Covid PNA  - BC positive for strep pneumoniae X 2 sets. therapies per primary team  ESRD - usually on TTS schedule. Missed HD prior  to admission. Had HD here on 1/08 and 1/10. Next HD today on schedule. [facility]  BP/ volume - BP's are wnl, she is 2 kg under dry wt, losing body wt. Keeping even w HD today. Euvolemic on exam. CXR neg.  GI bleed - per primary team. Normal admit Hb > 10. Follow.   Anemia ckd - now + acute blood loss but Hb above goal. Will defer ESA, note recent OP dose was held as well.  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 Cont renvela w/ meals.  SLE per primary Disposition - per pmd  Juna Caban H. Raynaldo Falco NP-C 05/21/2021, 11:06 AM  [employer] [phone]

## 2021-05-21 NOTE — Progress Notes (Addendum)
PROGRESS NOTE        PATIENT DETAILS Name: Kathleen [name] Age: 61 y.o. Sex: female Date of Birth: [birth year] Admit Date: 05/15/2021 Admitting Physician Orma Flaming, MD ESP:[id number], Beverly Gust, NP  Brief Narrative:  Patient is a 61 y.o. female with history of ESRD on HD TTS, HFrEF, SLE, anemia due to CKD, HTN-who presented with at least 1 week history of worsening weakness/lethargy, intermittent diarrhea, hematochezia-she had missed her last 3 HD sessions prior to hospitalization due to weakness/lethargy.  She was found to have COVID-19 infection-and thought to have uremic encephalopathy in the setting of missed HD.  She stabilized with Remdesivir and hemodialysis.  Unfortunately-Hospital course was complicated by severe arthralgias mostly in her left wrist/spine-low-grade fever-she was subsequently found to have streptococcal pneumonia bacteremia.  See below for further details.  Significant Hospital events 1/06>> admit with diarrhea/intermittent hematochezia from COVID-uremic encephalopathy from missed HD. 1/10>> developed arthralgias-mostly in her left wrist.Prednisone ordered 1/11>> low-grade fever-worsening left wrist arthralgia-CT left wrist negative for effusion.   1/11>> blood culture positive Streptococcus pneumoniae-started on IV Rocephin.  Subjective: Continues to have severe left wrist arthralgia.  Asking if she has a spine infection-as she complains of worsening pain in her spine-from her neck area all the way down to her waist.  Objective: Vitals: Blood pressure 124/76, pulse 98, temperature 98.3 F (36.8 C), temperature source Oral, resp. rate 18, weight 47.9 kg, SpO2 100 %.   Exam: Gen Exam:Alert awake-not in any distress HEENT:atraumatic, normocephalic Chest: B/L clear to auscultation anteriorly CVS:S1S2 regular Abdomen:soft non tender, non distended Extremities: Left wrist area appears to be still very tender-not much swelling evident  today. Neurology: Non focal Skin: no rash   Pertinent Labs/Radiology: Recent Labs  Lab 05/15/21 1549 05/15/21 2301 05/21/21 0222  WBC 13.6*   < > 17.5*  HGB 15.7*   < > 10.7*  PLT 150   < > 179  NA 135   < > 131*  K 3.6   < > 3.8  CREATININE 17.96*   < > 9.42*  AST 43*  --   --   ALT 12  --   --   ALKPHOS 66  --   --   BILITOT 0.7  --   --    < > = values in this interval not displayed.   Assessment/Plan: COVID-19 infection: Improved-diarrhea has resolved-no PNA on CXR-completed 3 days of Remdesivir   Diarrhea with intermittent hematochezia: Diarrhea due to COVID-19 infection-suspect hematochezia was in the setting of some mucosal sloughing.  Hematochezia has resolved-diarrhea is significantly improved.  Given significant debility/deconditioning and improving lethargy-suspect we can postpone endoscopic evaluation to the outpatient setting.  Discussed this with patient-she agrees.    Acute metabolic encephalopathy: Due to uremia in the setting of missed HD with some contribution from COVID-19.  Encephalopathy has improved with HD and treatment of underlying COVID-19 infection.    Streptococcal pneumonia bacteremia: Developed low-grade fever/left wrist arthralgia/back pain on 1/10-05/2009-blood cultures positive for Streptococcus pneumonia-has been started on IV Rocephin.  Repeat CXR on 1/12 without PNA-suspect she may have had a small superimposed PNA and COVID-19 infection not evident on imaging studies.  She is significantly improved compared to how she first presented to the hospital.  Given worsening back pain-MRI spine is pending-transthoracic echocardiogram has been ordered.  Follow closely-repeat blood cultures in the next few  days.  Left wrist arthralgia: Continues to have severe pain in the left wrist area-CT of the left wrist on 1/11 was negative for effusion.  Empirically started on prednisone on 1/11-no response yet.  Suspect this is probably related to her  bacteremia-unlikely to be related to her underlying lupus given normal titers of dsDNA and complement levels.  Continue steroids for a few more days-now on IV antibiotics.  SLE: No longer on Plaquenil-Per patient-this was discontinued several months ago by her nephrologist.  I wonder whether this was due to her prolonged QTC.  Complement levels/dsDNA levels are not elevated-doubt lupus flare causing her to have arthralgias. I have empirically started her on steroids since 1/11-we will give her a few more days to assess for any response.  ESRD: Nephrology following-we will defer hemodialysis to nephrology service.  Hypokalemia: Repleted  Chronic systolic heart failure (EF 45-50% on 03/13/2021): Volume status is stable.  Watch closely.  HTN: BP now slowly creeping up-suspect we can still continue to monitor without the use of antihypertensives.  Prolonged QTC: Appears to have develops some amount of QTC elongation over the past few months-repeat EKG this morning shows significant improvement.  We will attempt to keep K> 4, and Mg>2.  Monitor EKGs periodically-continue telemetry monitoring.    Tobacco abuse: Continue transdermal nicotine  Deconditioning/debility: Likely due to COVID-19 infection-and worsening uremia from missed HD.  Hopefully will continue to improve with further hemodialysis-improvement in underlying COVID-19 infection.  Evaluated by PT services-felt to require SNF on discharge.  Social worker following.   BMI Estimated body mass index is 18.13 kg/m as calculated from the following:   Height as of 03/12/21: 5\' 4"  (1.626 m).   Weight as of this encounter: 47.9 kg.    Procedures: None Consults: None DVT Prophylaxis: SQ heparin Code Status:Full code  Family Communication: None at bedside  Disposition Plan: Status is: Inpatient  Severe debility/deconditioning-now with worsening encephalopathy due to uremia-needs hemodialysis today.  On IV Remdesivir for COVID.  SNF planned  on discharge.  Diet: Diet Order             Diet renal with fluid restriction Fluid restriction: 1200 mL Fluid; Room service appropriate? Yes; Fluid consistency: Thin  Diet effective now                     Antimicrobial agents: Anti-infectives (From admission, onward)    Start     Dose/Rate Route Frequency Ordered Stop   05/21/21 0000  cefTRIAXone (ROCEPHIN) 2 g in sodium chloride 0.9 % 100 mL IVPB        2 g 200 mL/hr over 30 Minutes Intravenous Daily at bedtime 05/20/21 2305     05/17/21 1000  remdesivir 100 mg in sodium chloride 0.9 % 100 mL IVPB       See Hyperspace for full Linked Orders Report.   100 mg 200 mL/hr over 30 Minutes Intravenous Daily 05/16/21 0847 05/18/21 0857   05/16/21 1000  remdesivir 200 mg in sodium chloride 0.9% 250 mL IVPB       See Hyperspace for full Linked Orders Report.   200 mg 580 mL/hr over 30 Minutes Intravenous Once 05/16/21 0847 05/16/21 1043        MEDICATIONS: Scheduled Meds:  Chlorhexidine Gluconate Cloth  6 each Topical Q0600   cinacalcet  60 mg Oral Once per day on Mon Wed Fri   cinacalcet  60 mg Oral Q T,Th,Sa-HD   famotidine  20 mg Oral Daily  feeding supplement (NEPRO CARB STEADY)  237 mL Oral TID BM   heparin injection (subcutaneous)  5,000 Units Subcutaneous Q8H   multivitamin  1 tablet Oral QHS   nicotine  14 mg Transdermal Daily   potassium chloride  20 mEq Oral Once   predniSONE  40 mg Oral Q breakfast   sevelamer carbonate  1,600 mg Oral TID WC   sodium chloride flush  3 mL Intravenous Q12H   Continuous Infusions:  sodium chloride     cefTRIAXone (ROCEPHIN)  IV 2 g (05/20/21 2341)   PRN Meds:.sodium chloride, acetaminophen **OR** acetaminophen, LORazepam, oxyCODONE, sodium chloride flush   I have personally reviewed following labs and imaging studies  LABORATORY DATA: CBC: Recent Labs  Lab 05/15/21 1549 05/15/21 2301 05/17/21 0313 05/18/21 0239 05/19/21 0634 05/20/21 0815 05/21/21 0222  WBC  13.6*   < > 9.2 8.9 9.4 13.8* 17.5*  NEUTROABS 11.7*  --   --   --   --   --   --   HGB 15.7*   < > 14.2 12.8 11.5* 12.1 10.7*  HCT 50.4*   < > 44.1 38.2 35.1* 38.0 32.6*  MCV 92.8   < > 90.7 87.0 88.2 88.6 85.1  PLT 150   < > 151 164 171 163 179   < > = values in this interval not displayed.    Basic Metabolic Panel: Recent Labs  Lab 05/15/21 1549 05/16/21 0157 05/17/21 0313 05/18/21 0239 05/19/21 0634 05/20/21 0142 05/21/21 0222  NA 135   < > 133* 136 133* 135 131*  K 3.6   < > 3.6 3.2* 3.7 3.9 3.8  CL 93*   < > 91* 95* 96* 94* 89*  CO2 19*   < > 14* 23 19* 26 26  GLUCOSE 115*   < > 68* 92 121* 105* 110*  BUN 65*   < > 96* 46* 81* 33* 55*  CREATININE 17.96*   < > 19.68* 10.84* 13.00* 7.21* 9.42*  CALCIUM 8.9   < > 7.5* 7.9* 6.9* 7.2* 7.3*  MG 2.9*  --  2.5*  --   --  2.1 2.4  PHOS  --   --  8.2* 4.6 4.0 2.8 4.3   < > = values in this interval not displayed.    GFR: Estimated Creatinine Clearance: 4.8 mL/min (A) (by C-G formula based on SCr of 9.42 mg/dL (H)).  Liver Function Tests: Recent Labs  Lab 05/15/21 1549 05/17/21 0313 05/18/21 0239 05/19/21 0634 05/20/21 0142 05/21/21 0222  AST 43*  --   --   --   --   --   ALT 12  --   --   --   --   --   ALKPHOS 66  --   --   --   --   --   BILITOT 0.7  --   --   --   --   --   PROT 9.1*  --   --   --   --   --   ALBUMIN 4.0 3.3* 2.7* 2.6* 2.6* 2.3*   No results for input(s): LIPASE, AMYLASE in the last 168 hours. Recent Labs  Lab 05/15/21 1550  AMMONIA 28    Coagulation Profile: No results for input(s): INR, PROTIME in the last 168 hours.  Cardiac Enzymes: No results for input(s): CKTOTAL, CKMB, CKMBINDEX, TROPONINI in the last 168 hours.  BNP (last 3 results) No results for input(s): PROBNP in the last 8760 hours.  Lipid Profile: No results for input(s): CHOL, HDL, LDLCALC, TRIG, CHOLHDL, LDLDIRECT in the last 72 hours.  Thyroid Function Tests: No results for input(s): TSH, T4TOTAL, FREET4, T3FREE,  THYROIDAB in the last 72 hours.   Anemia Panel: No results for input(s): VITAMINB12, FOLATE, FERRITIN, TIBC, IRON, RETICCTPCT in the last 72 hours.  Urine analysis:    Component Value Date/Time   COLORURINE YELLOW 05/15/2021 2252   APPEARANCEUR CLEAR 05/15/2021 2252   LABSPEC 1.020 05/15/2021 2252   PHURINE 6.0 05/15/2021 2252   GLUCOSEU NEGATIVE 05/15/2021 2252   HGBUR NEGATIVE 05/15/2021 2252   BILIRUBINUR NEGATIVE 05/15/2021 2252   KETONESUR NEGATIVE 05/15/2021 2252   PROTEINUR NEGATIVE 05/15/2021 2252   NITRITE NEGATIVE 05/15/2021 2252   LEUKOCYTESUR NEGATIVE 05/15/2021 2252    Sepsis Labs: Lactic Acid, Venous    Component Value Date/Time   LATICACIDVEN 1.40 07/11/2017 0545    MICROBIOLOGY: Recent Results (from the past 240 hour(s))  Resp Panel by RT-PCR (Flu A&B, Covid) Nasopharyngeal Swab     Status: Abnormal   Collection Time: 05/15/21  3:26 PM   Specimen: Nasopharyngeal Swab; Nasopharyngeal(NP) swabs in vial transport medium  Result Value Ref Range Status   SARS Coronavirus 2 by RT PCR POSITIVE (A) NEGATIVE Final    Comment: (NOTE) SARS-CoV-2 target nucleic acids are DETECTED.  The SARS-CoV-2 RNA is generally detectable in upper respiratory specimens during the acute phase of infection. Positive results are indicative of the presence of the identified virus, but do not rule out bacterial infection or co-infection with other pathogens not detected by the test. Clinical correlation with patient history and other diagnostic information is necessary to determine patient infection status. The expected result is Negative.  Fact Sheet for Patients: EntrepreneurPulse.com.au  Fact Sheet for Healthcare Providers: IncredibleEmployment.be  This test is not yet approved or cleared by the [place] FDA and  has been authorized for detection and/or diagnosis of SARS-CoV-2 by FDA under an Emergency Use Authorization (EUA).  This  EUA will remain in effect (meaning this test can be used) for the duration of  the COVID-19 declaration under Section 564(b)(1) of the A ct, 21 U.S.C. section 360bbb-3(b)(1), unless the authorization is terminated or revoked sooner.     Influenza A by PCR NEGATIVE NEGATIVE Final   Influenza B by PCR NEGATIVE NEGATIVE Final    Comment: (NOTE) The Xpert Xpress SARS-CoV-2/FLU/RSV plus assay is intended as an aid in the diagnosis of influenza from Nasopharyngeal swab specimens and should not be used as a sole basis for treatment. Nasal washings and aspirates are unacceptable for Xpert Xpress SARS-CoV-2/FLU/RSV testing.  Fact Sheet for Patients: EntrepreneurPulse.com.au  Fact Sheet for Healthcare Providers: IncredibleEmployment.be  This test is not yet approved or cleared by the [place] FDA and has been authorized for detection and/or diagnosis of SARS-CoV-2 by FDA under an Emergency Use Authorization (EUA). This EUA will remain in effect (meaning this test can be used) for the duration of the COVID-19 declaration under Section 564(b)(1) of the Act, 21 U.S.C. section 360bbb-3(b)(1), unless the authorization is terminated or revoked.  Performed at [facility], [place] [address] [place], [place] [address]   Culture, blood (routine x 2)     Status: Abnormal (Preliminary result)   Collection Time: 05/20/21  8:29 AM   Specimen: BLOOD  Result Value Ref Range Status   Specimen Description BLOOD RIGHT ANTECUBITAL  Final   Special Requests   Final    BOTTLES DRAWN AEROBIC AND ANAEROBIC Blood Culture adequate  volume   Culture  Setup Time   Final    GRAM POSITIVE COCCI IN CHAINS IN BOTH AEROBIC AND ANAEROBIC BOTTLES CRITICAL RESULT CALLED TO, READ BACK BY AND VERIFIED WITH: PHARMD G. ABBOTT  05/20/21 @2257  BY JW    Culture (A)  Final    STREPTOCOCCUS PNEUMONIAE SUSCEPTIBILITIES TO FOLLOW Performed at [facility], [place] [address], [place], [place] [address]    Report Status PENDING  Incomplete  Blood Culture ID Panel (Reflexed)     Status: Abnormal   Collection Time: 05/20/21  8:29 AM  Result Value Ref Range Status   Enterococcus faecalis NOT DETECTED NOT DETECTED Final   Enterococcus Faecium NOT DETECTED NOT DETECTED Final   Listeria monocytogenes NOT DETECTED NOT DETECTED Final   Staphylococcus species NOT DETECTED NOT DETECTED Final   Staphylococcus aureus (BCID) NOT DETECTED NOT DETECTED Final   Staphylococcus epidermidis NOT DETECTED NOT DETECTED Final   Staphylococcus lugdunensis NOT DETECTED NOT DETECTED Final   Streptococcus species DETECTED (A) NOT DETECTED Final    Comment: CRITICAL RESULT CALLED TO, READ BACK BY AND VERIFIED WITH: PHARMD G. ABBOTT  05/20/21 @2257  BY JW    Streptococcus agalactiae NOT DETECTED NOT DETECTED Final   Streptococcus pneumoniae DETECTED (A) NOT DETECTED Final    Comment: CRITICAL RESULT CALLED TO, READ BACK BY AND VERIFIED WITH: PHARMD G. ABBOTT  05/20/21 @2257  BY JW    Streptococcus pyogenes NOT DETECTED NOT DETECTED Final   A.calcoaceticus-baumannii NOT DETECTED NOT DETECTED Final   Bacteroides fragilis NOT DETECTED NOT DETECTED Final   Enterobacterales NOT DETECTED NOT DETECTED Final   Enterobacter cloacae complex NOT DETECTED NOT DETECTED Final   Escherichia coli NOT DETECTED NOT DETECTED Final   Klebsiella aerogenes NOT DETECTED NOT DETECTED Final   Klebsiella oxytoca NOT DETECTED NOT DETECTED Final   Klebsiella pneumoniae NOT DETECTED NOT DETECTED Final   Proteus species NOT DETECTED NOT DETECTED Final   Salmonella species NOT DETECTED NOT DETECTED Final   Serratia marcescens NOT DETECTED NOT DETECTED Final   Haemophilus influenzae NOT DETECTED NOT DETECTED Final   Neisseria meningitidis NOT DETECTED NOT DETECTED Final   Pseudomonas aeruginosa NOT DETECTED NOT DETECTED Final   Stenotrophomonas maltophilia NOT DETECTED NOT DETECTED Final   Candida albicans NOT  DETECTED NOT DETECTED Final   Candida auris NOT DETECTED NOT DETECTED Final   Candida glabrata NOT DETECTED NOT DETECTED Final   Candida krusei NOT DETECTED NOT DETECTED Final   Candida parapsilosis NOT DETECTED NOT DETECTED Final   Candida tropicalis NOT DETECTED NOT DETECTED Final   Cryptococcus neoformans/gattii NOT DETECTED NOT DETECTED Final    Comment: Performed at [facility], [address] [place], [place] [address]  Culture, blood (routine x 2)     Status: Abnormal (Preliminary result)   Collection Time: 05/20/21  8:42 AM   Specimen: BLOOD RIGHT HAND  Result Value Ref Range Status   Specimen Description BLOOD RIGHT HAND  Final   Special Requests   Final    BOTTLES DRAWN AEROBIC ONLY Blood Culture results may not be optimal due to an inadequate volume of blood received in culture bottles   Culture  Setup Time   Final    GRAM POSITIVE COCCI IN CHAINS AEROBIC BOTTLE ONLY CRITICAL VALUE NOTED.  VALUE IS CONSISTENT WITH PREVIOUSLY REPORTED AND CALLED VALUE. Performed at [facility], [place] [address] [place], [place] [address]    Culture STREPTOCOCCUS PNEUMONIAE (A)  Final   Report Status  PENDING  Incomplete    RADIOLOGY STUDIES/RESULTS: DG Wrist 2 Views Left  Result Date: 05/20/2021 CLINICAL DATA:  Swelling EXAM: LEFT WRIST - 2 VIEW COMPARISON:  None. FINDINGS: There is no evidence of fracture or dislocation. There is no evidence of arthropathy or other focal bone abnormality. Soft tissues are unremarkable. IMPRESSION: Negative. Electronically Signed   By: Macy Mis M.D.   On: 05/20/2021 09:30   CT WRIST LEFT W CONTRAST  Result Date: 05/20/2021 CLINICAL DATA:  Left wrist pain. History of lupus. Evaluate for septic arthritis. EXAM: CT OF THE UPPER LEFT EXTREMITY WITH CONTRAST TECHNIQUE: Multidetector CT imaging of the left wrist was performed according to the standard protocol following intravenous contrast administration. RADIATION DOSE REDUCTION: This exam was  performed according to the departmental dose-optimization program which includes automated exposure control, adjustment of the mA and/or kV according to patient size and/or use of iterative reconstruction technique. CONTRAST:  173mL OMNIPAQUE IOHEXOL 300 MG/ML  SOLN COMPARISON:  Left wrist x-rays from same day. FINDINGS: Bones/Joint/Cartilage No bony destruction or periosteal reaction. No fracture or dislocation. Joint spaces are preserved. No joint effusion. Ligaments Ligaments are suboptimally evaluated by CT. Muscles and Tendons Grossly intact. Soft tissue No fluid collection or subcutaneous emphysema.  No soft tissue mass. IMPRESSION: 1. No acute osseous abnormality.  No joint effusion. Electronically Signed   By: Titus Dubin M.D.   On: 05/20/2021 16:06   DG Chest Port 1V same Day  Result Date: 05/21/2021 CLINICAL DATA:  Shortness of breath EXAM: PORTABLE CHEST 1 VIEW COMPARISON:  05/15/2021 FINDINGS: Artifact from EKG leads. Chronic cardiomegaly. Mild atelectatic type opacity at the bases. Paramediastinal bleb at the aortic arch. There is no edema, consolidation, effusion, or pneumothorax. IMPRESSION: Stable exam.  No acute finding. Electronically Signed   By: Jorje Guild M.D.   On: 05/21/2021 08:53     LOS: 5 days   Oren Binet, MD  [facility]    To contact the attending provider between 7A-7P or the covering provider during after hours 7P-7A, please log into the web site [email or web address] and access using universal [place] password for that web site. If you do not have the password, please call the hospital operator.  05/21/2021, 1:45 PM

## 2021-05-21 NOTE — Progress Notes (Incomplete)
Echocardiogram 2D Echocardiogram has been performed.  Kathleen [name] 05/21/2021, 11:14 AM

## 2021-05-21 NOTE — TOC Initial Note (Addendum)
Transition of Care [facility]) - Initial/Assessment Note    Patient Details  Name: Kathleen [name] MRN: [id number] Date of Birth: 1961/04/22  Transition of [facility]) CM/SW Contact:    Emeterio Reeve, LCSW Phone Number: 05/21/2021, 11:46 AM  Clinical Narrative:                  CSW received SNF consult. CSW spoke with pt on phone. CSW introduced self and explained role at the hospital. Pt reports that PTA she was living with a friend and reports she was independent with mobility and ADL's.   CSW reviewed PT/OT recommendations for SNF. Pt reports she is ok with going to a SNF for rehab.  Pt gave CSW permission to fax out to facilities in the area. Pt has no preference of facility at this time. CSW gave pt medicare.gov rating list to review. CSW explained insurance auth process.  Pts HD schedule is [facility] [place] ([place] on TTS. Pt normally arrives at 6:10 for 6:30 chair time.   CSW will continue to follow.   Expected Discharge Plan: Skilled Nursing Facility Barriers to Discharge: Ship broker, Continued Medical Work up, SNF Covid   Patient Goals and CMS Choice Patient states their goals for this hospitalization and ongoing recovery are:: to get better CMS Medicare.gov Compare Post Acute Care list provided to:: Patient Choice offered to / list presented to : Patient  Expected Discharge Plan and Services Expected Discharge Plan: [place]       Living arrangements for the past 2 months: Single Family Home                                      Prior Living Arrangements/Services Living arrangements for the past 2 months: Single Family Home Lives with:: Friends Patient language and need for interpreter reviewed:: Yes Do you feel safe going back to the place where you live?: Yes      Need for Family Participation in Patient Care: Yes (Comment) Care giver support system in place?: Yes (comment)   Criminal Activity/Legal Involvement Pertinent to  Current Situation/Hospitalization: No - Comment as needed  Activities of Daily Living      Permission Sought/Granted   Permission granted to share information with : Yes, Verbal Permission Granted     Permission granted to share info w AGENCY: SNF        Emotional Assessment Appearance:: Appears stated age Attitude/Demeanor/Rapport: Engaged Affect (typically observed): Appropriate Orientation: : Oriented to Self, Oriented to Place, Oriented to  Time, Oriented to Situation Alcohol / Substance Use: Not Applicable Psych Involvement: No (comment)  Admission diagnosis:  Uremia [N19] Patient Active Problem List   Diagnosis Date Noted   Uremia 05/16/2021   Rectal bleeding 05/15/2021   Chronic combined systolic and diastolic CHF (congestive heart failure) ([place]) 05/15/2021   FTT (failure to thrive) in adult 05/15/2021   Hyperkalemia 03/12/2021   Prolonged QT interval 03/12/2021   Acute respiratory failure with hypoxia ([place]) 03/12/2021   Acute on chronic diastolic congestive heart failure ([place]) 03/12/2021   Onychomycosis 11/25/2018   Pre-ulcerative calluses 11/25/2018   Neuropathy 10/04/2018   Acute encephalopathy 07/11/2017   ESRD (end stage renal disease) ([place]) 07/11/2017   COPD (chronic obstructive pulmonary disease) ([place])    Anemia in ESRD (end-stage renal disease) ([place])    Coagulation defect, unspecified ([place]) 07/09/2017   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 ([place]) 07/09/2017  Iron deficiency anemia, unspecified 07/09/2017   Secondary hyperparathyroidism of renal origin ([place]) 07/09/2017   Tobacco use 07/09/2017   Tobacco dependence 11/30/2016   Malnutrition of moderate degree 07/14/2016   Lupus ([place]) 06/28/2016   Hypertension 06/28/2016   SLE exacerbation ([place]) 06/28/2016   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  Emeterio Reeve, LCSW Clinical Social Worker

## 2021-05-22 ENCOUNTER — Inpatient Hospital Stay (HOSPITAL_COMMUNITY): Payer: Medicare Other

## 2021-05-22 LAB — CBC
HCT: 29.9 % — ABNORMAL LOW (ref 36.0–46.0)
Hemoglobin: 10.3 g/dL — ABNORMAL LOW (ref 12.0–15.0)
MCH: 29.2 pg (ref 26.0–34.0)
MCHC: 34.4 g/dL (ref 30.0–36.0)
MCV: 84.7 fL (ref 80.0–100.0)
Platelets: 199 10*3/uL (ref 150–400)
RBC: 3.53 MIL/uL — ABNORMAL LOW (ref 3.87–5.11)
RDW: 18.8 % — ABNORMAL HIGH (ref 11.5–15.5)
WBC: 18.5 10*3/uL — ABNORMAL HIGH (ref 4.0–10.5)
nRBC: 0 % (ref 0.0–0.2)

## 2021-05-22 LAB — CULTURE, BLOOD (ROUTINE X 2): Special Requests: ADEQUATE

## 2021-05-22 LAB — RENAL FUNCTION PANEL
Albumin: 2.2 g/dL — ABNORMAL LOW (ref 3.5–5.0)
Anion gap: 15 (ref 5–15)
BUN: 88 mg/dL — ABNORMAL HIGH (ref 6–20)
CO2: 27 mmol/L (ref 22–32)
Calcium: 8 mg/dL — ABNORMAL LOW (ref 8.9–10.3)
Chloride: 87 mmol/L — ABNORMAL LOW (ref 98–111)
Creatinine, Ser: 11.24 mg/dL — ABNORMAL HIGH (ref 0.44–1.00)
GFR, Estimated: 4 mL/min — ABNORMAL LOW (ref >60)
Glucose, Bld: 104 mg/dL — ABNORMAL HIGH (ref 70–99)
Phosphorus: 4.1 mg/dL (ref 2.5–4.6)
Potassium: 4.3 mmol/L (ref 3.5–5.1)
Sodium: 129 mmol/L — ABNORMAL LOW (ref 135–145)

## 2021-05-22 MED ORDER — PENICILLIN G POTASSIUM 5000000 UNITS IJ SOLR
2.0000 10*6.[IU] | Freq: Four times a day (QID) | INTRAVENOUS | Status: AC
  Administered 2021-05-22 – 2021-05-25 (×14): 2 10*6.[IU] via INTRAVENOUS
  Filled 2021-05-22 (×16): qty 2

## 2021-05-22 NOTE — Progress Notes (Signed)
Initial Nutrition Assessment  DOCUMENTATION CODES:   Underweight, Non-severe (moderate) malnutrition in context of chronic illness  INTERVENTION:   Recommend liberalizing pt diet to regular due to malnutrition. Received ok from MD. Encourage good PO intake Renal Multivitamin w/ minerals daily Continue Nepro Shake po TID, each supplement provides 425 kcal and 19 grams protein  NUTRITION DIAGNOSIS:   Moderate Malnutrition related to chronic illness (ESRD, CHF, COPD) as evidenced by severe muscle depletion, moderate fat depletion.   GOAL:   Patient will meet greater than or equal to 90% of their needs - Ongoing  MONITOR:   PO intake, Supplement acceptance, Labs, Weight trends  REASON FOR ASSESSMENT:   Consult Assessment of nutrition requirement/status, Poor PO  ASSESSMENT:   61 y.o. female presented to the ED with rectal bleeding x3-5 days, poor PO intake, weakness, and fatigue. Pt reports missing at least the last 3 dialysis treatments due to not feeling well. PMH includes ESRD on HD (TTS), CHF, HTN, COPD, Lupus, and TIA. Pt admitted with failure to thrive and rectal bleeding.   Pt reports that she is doing good and eating well. Pt reports that she likes the Nepro's and are drinking them. Denies any nausea and vomiting.  Per EMR, pt intake includes: 01/10: lunch 0% 01/12: Breakfast 50%, Lunch 100%, Dinner 100%  Per EMR, pt has been accepting all ONS offered.   Medications reviewed and include: Cinacalcet, Pepcid, Rena-Vit, Prednisone, Renvela, IV antibiotic Labs reviewed:  - Sodium 129  HD on 01/13 EDW: 50.8 kg Net UF: 0 mL  Nutrition Focused Physical Exam  Flowsheet Row Most Recent Value  Orbital Region Moderate depletion  Upper Arm Region Moderate depletion  Thoracic and Lumbar Region Moderate depletion  Buccal Region Moderate depletion  Temple Region Mild depletion  Clavicle Bone Region Severe depletion  Clavicle and Acromion Bone Region Severe depletion   Scapular Bone Region Severe depletion  Dorsal Hand Moderate depletion  Patellar Region Severe depletion  Anterior Thigh Region Severe depletion  Posterior Calf Region Severe depletion  Edema (RD Assessment) None  Hair Reviewed  Eyes Reviewed  Mouth Reviewed  Skin Reviewed  Nails Reviewed      Diet Order:   Diet Order             Diet renal with fluid restriction Fluid restriction: 1200 mL Fluid; Room service appropriate? Yes; Fluid consistency: Thin  Diet effective now                   EDUCATION NEEDS:   No education needs have been identified at this time  Skin:  Skin Assessment: Reviewed RN Assessment (MASD - Buttocks)  Last BM:  01/12  Height:   Ht Readings from Last 1 Encounters:  03/12/21 5\' 4"  (1.626 m)    Weight:   Wt Readings from Last 1 Encounters:  05/21/21 47.9 kg    Ideal Body Weight:  54.6 kg  BMI:  Body mass index is 18.13 kg/m.  Estimated Nutritional Needs:   Kcal:  1550-1750  Protein:  80-95 grams  Fluid:  UOP + 1L    Shawnette Augello Louie Casa, RD, LDN Clinical Dietitian See [facility] for contact information.

## 2021-05-22 NOTE — Progress Notes (Addendum)
PROGRESS NOTE        PATIENT DETAILS Name: Kathleen [name] Age: 61 y.o. Sex: female Date of Birth: 11/13/1960 Admit Date: 05/15/2021 Admitting Physician Orma Flaming, MD GDJ:MEQAS, Beverly Gust, NP  Brief Narrative:  Patient is a 61 y.o. female with history of ESRD on HD TTS, HFrEF, SLE, anemia due to CKD, HTN-who presented with at least 1 week history of worsening weakness/lethargy, intermittent diarrhea, hematochezia-she had missed her last 3 HD sessions prior to hospitalization due to weakness/lethargy.  She was found to have COVID-19 infection-and thought to have uremic encephalopathy in the setting of missed HD.  She stabilized with Remdesivir and hemodialysis.  Unfortunately-Hospital course was complicated by severe arthralgias mostly in her left wrist/spine-low-grade fever-she was subsequently found to have streptococcal pneumonia bacteremia.  See below for further details.  Significant Hospital events 1/06>> admit with diarrhea/intermittent hematochezia from COVID-uremic encephalopathy from missed HD. 1/10>> developed arthralgias-mostly in her left wrist.Prednisone ordered 1/11>> low-grade fever-worsening left wrist arthralgia-CT left wrist negative for effusion.   1/11>> blood culture positive Streptococcus pneumoniae-started on IV Rocephin.  Subjective: Left wrist arthralgia is much better.  Continues to complain of pain in "entire spine"  Objective: Vitals: Blood pressure 95/63, pulse 80, temperature 98.2 F (36.8 C), temperature source Oral, resp. rate 20, weight 47.9 kg, SpO2 100 %.   Exam: Gen Exam:Alert awake-not in any distress HEENT:atraumatic, normocephalic Chest: B/L clear to auscultation anteriorly CVS:S1S2 regular Abdomen:soft non tender, non distended Extremities:no edema.  Significant decrease in left wrist arthralgia-no swelling evident today. Neurology: Non focal Skin: no rash   Pertinent Labs/Radiology: Recent Labs  Lab  05/15/21 1549 05/15/21 2301 05/22/21 0549  WBC 13.6*   < > 18.5*  HGB 15.7*   < > 10.3*  PLT 150   < > 199  NA 135   < > 129*  K 3.6   < > 4.3  CREATININE 17.96*   < > 11.24*  AST 43*  --   --   ALT 12  --   --   ALKPHOS 66  --   --   BILITOT 0.7  --   --    < > = values in this interval not displayed.    Assessment/Plan: COVID-19 infection: Improved-diarrhea has resolved-no PNA on CXR-completed 3 days of Remdesivir   Diarrhea with intermittent hematochezia: Diarrhea due to COVID-19 infection-suspect hematochezia was in the setting of some mucosal sloughing.  Hematochezia has resolved-diarrhea is significantly improved.  Given significant debility/deconditioning/stable hemoglobin-suspect we can postpone endoscopic evaluation to the outpatient setting.  Discussed this with patient-she agrees.    Acute metabolic encephalopathy: Resolved.  Due to uremia in the setting of missed HD with some contribution from COVID-19.    Streptococcal pneumonia bacteremia: Developed low-grade fever/left wrist arthralgia/back pain on 1/10-1/11-blood cultures are positive for Streptococcus pneumonia.  On Rocephin.  No PNA on CXR.  TTE without obvious vegetations.  Await spine MRI (has back pain) repeat blood cultures done on 1/12.  ID following.    Left wrist arthralgia: Significantly improved today-has been on steroids since 1/10 and IV Rocephin since 1/11.  Suspect this is reactive to streptococcal bacteremia.  Although she has reported history of SLE-complement levels were low and dsDNA titers are not elevated-not suggestive of SLE flare.  In any event-would continue with steroids for a few more days and taper gradually-already on  IV Rocephin for bacteremia.  Recent CT of the wrist without any effusion.  SLE: No longer on Plaquenil-Per patient-this was discontinued several months ago by her nephrologist.  I wonder whether this was due to her prolonged QTC.  On steroids-she follows with rheumatology at [facility].  Thankfully left wrist arthralgia has improved following initiation of the steroids and IV Rocephin.  We will continue with steroids for a few more days.   ESRD: Nephrology following-we will defer hemodialysis to nephrology service.  Hypokalemia: Repleted  Chronic systolic heart failure (EF 45-50% on 03/13/2021): Volume status is stable.  Watch closely.  HTN: BP now slowly creeping up-suspect we can still continue to monitor without the use of antihypertensives.  Prolonged QTC: Appears to have develops some amount of QTC elongation over the past few months-repeat EKG on 1/11 and 1/13 with stable/normalized QTC.    We will attempt to keep K> 4, and Mg>2.  Monitor EKGs periodically-continue telemetry monitoring.    Tobacco abuse: Continue transdermal nicotine  Deconditioning/debility: Likely due to COVID-19 infection-and worsening uremia from missed HD.  Hopefully will continue to improve with further hemodialysis-improvement in underlying COVID-19 infection.  Evaluated by PT services-felt to require SNF on discharge.  Social worker following.   BMI Estimated body mass index is 18.13 kg/m as calculated from the following:   Height as of 03/12/21: 5\' 4"  (1.626 m).   Weight as of this encounter: 47.9 kg.    Procedures: None Consults: ID, nephrology. DVT Prophylaxis: SQ heparin Code Status:Full code  Family Communication: None at bedside  Disposition Plan: Status is: Inpatient  Severe debility/deconditioning-now with worsening encephalopathy due to uremia-needs hemodialysis today.  On IV Remdesivir for COVID.  SNF planned on discharge.  Diet: Diet Order             Diet renal with fluid restriction Fluid restriction: 1200 mL Fluid; Room service appropriate? Yes; Fluid consistency: Thin  Diet effective now                     Antimicrobial agents: Anti-infectives (From admission, onward)    Start     Dose/Rate Route Frequency Ordered Stop    05/22/21 1245  penicillin G potassium 2 Million Units in dextrose 5 % 50 mL IVPB        2 Million Units 100 mL/hr over 30 Minutes Intravenous Every 6 hours 05/22/21 1145     05/21/21 0000  cefTRIAXone (ROCEPHIN) 2 g in sodium chloride 0.9 % 100 mL IVPB  Status:  Discontinued        2 g 200 mL/hr over 30 Minutes Intravenous Daily at bedtime 05/20/21 2305 05/22/21 1145   05/17/21 1000  remdesivir 100 mg in sodium chloride 0.9 % 100 mL IVPB       See Hyperspace for full Linked Orders Report.   100 mg 200 mL/hr over 30 Minutes Intravenous Daily 05/16/21 0847 05/18/21 0857   05/16/21 1000  remdesivir 200 mg in sodium chloride 0.9% 250 mL IVPB       See Hyperspace for full Linked Orders Report.   200 mg 580 mL/hr over 30 Minutes Intravenous Once 05/16/21 0847 05/16/21 1043        MEDICATIONS: Scheduled Meds:  Chlorhexidine Gluconate Cloth  6 each Topical Q0600   cinacalcet  60 mg Oral Once per day on Mon Wed Fri   cinacalcet  60 mg Oral Q T,Th,Sa-HD   famotidine  20 mg Oral Daily   feeding supplement (  NEPRO CARB STEADY)  237 mL Oral TID BM   heparin injection (subcutaneous)  5,000 Units Subcutaneous Q8H   multivitamin  1 tablet Oral QHS   nicotine  14 mg Transdermal Daily   predniSONE  40 mg Oral Q breakfast   sevelamer carbonate  1,600 mg Oral TID WC   sodium chloride flush  3 mL Intravenous Q12H   Continuous Infusions:  sodium chloride     pencillin G potassium IV     PRN Meds:.sodium chloride, acetaminophen **OR** acetaminophen, LORazepam, oxyCODONE, sodium chloride flush   I have personally reviewed following labs and imaging studies  LABORATORY DATA: CBC: Recent Labs  Lab 05/15/21 1549 05/15/21 2301 05/18/21 0239 05/19/21 0634 05/20/21 0815 05/21/21 0222 05/22/21 0549  WBC 13.6*   < > 8.9 9.4 13.8* 17.5* 18.5*  NEUTROABS 11.7*  --   --   --   --   --   --   HGB 15.7*   < > 12.8 11.5* 12.1 10.7* 10.3*  HCT 50.4*   < > 38.2 35.1* 38.0 32.6* 29.9*  MCV 92.8   < >  87.0 88.2 88.6 85.1 84.7  PLT 150   < > 164 171 163 179 199   < > = values in this interval not displayed.     Basic Metabolic Panel: Recent Labs  Lab 05/15/21 1549 05/16/21 0157 05/17/21 0313 05/18/21 0239 05/19/21 0634 05/20/21 0142 05/21/21 0222 05/22/21 0549  NA 135   < > 133* 136 133* 135 131* 129*  K 3.6   < > 3.6 3.2* 3.7 3.9 3.8 4.3  CL 93*   < > 91* 95* 96* 94* 89* 87*  CO2 19*   < > 14* 23 19* 26 26 27   GLUCOSE 115*   < > 68* 92 121* 105* 110* 104*  BUN 65*   < > 96* 46* 81* 33* 55* 88*  CREATININE 17.96*   < > 19.68* 10.84* 13.00* 7.21* 9.42* 11.24*  CALCIUM 8.9   < > 7.5* 7.9* 6.9* 7.2* 7.3* 8.0*  MG 2.9*  --  2.5*  --   --  2.1 2.4  --   PHOS  --    < > 8.2* 4.6 4.0 2.8 4.3 4.1   < > = values in this interval not displayed.     GFR: Estimated Creatinine Clearance: 4 mL/min (A) (by C-G formula based on SCr of 11.24 mg/dL (H)).  Liver Function Tests: Recent Labs  Lab 05/15/21 1549 05/17/21 0313 05/18/21 0239 05/19/21 0634 05/20/21 0142 05/21/21 0222 05/22/21 0549  AST 43*  --   --   --   --   --   --   ALT 12  --   --   --   --   --   --   ALKPHOS 66  --   --   --   --   --   --   BILITOT 0.7  --   --   --   --   --   --   PROT 9.1*  --   --   --   --   --   --   ALBUMIN 4.0   < > 2.7* 2.6* 2.6* 2.3* 2.2*   < > = values in this interval not displayed.    No results for input(s): LIPASE, AMYLASE in the last 168 hours. Recent Labs  Lab 05/15/21 1550  AMMONIA 28     Coagulation Profile: No results for input(s): INR,  PROTIME in the last 168 hours.  Cardiac Enzymes: No results for input(s): CKTOTAL, CKMB, CKMBINDEX, TROPONINI in the last 168 hours.  BNP (last 3 results) No results for input(s): PROBNP in the last 8760 hours.  Lipid Profile: No results for input(s): CHOL, HDL, LDLCALC, TRIG, CHOLHDL, LDLDIRECT in the last 72 hours.  Thyroid Function Tests: No results for input(s): TSH, T4TOTAL, FREET4, T3FREE, THYROIDAB in the last 72  hours.   Anemia Panel: No results for input(s): VITAMINB12, FOLATE, FERRITIN, TIBC, IRON, RETICCTPCT in the last 72 hours.  Urine analysis:    Component Value Date/Time   COLORURINE YELLOW 05/15/2021 2252   APPEARANCEUR CLEAR 05/15/2021 2252   LABSPEC 1.020 05/15/2021 2252   PHURINE 6.0 05/15/2021 2252   GLUCOSEU NEGATIVE 05/15/2021 2252   HGBUR NEGATIVE 05/15/2021 2252   BILIRUBINUR NEGATIVE 05/15/2021 2252   KETONESUR NEGATIVE 05/15/2021 2252   PROTEINUR NEGATIVE 05/15/2021 2252   NITRITE NEGATIVE 05/15/2021 2252   LEUKOCYTESUR NEGATIVE 05/15/2021 2252    Sepsis Labs: Lactic Acid, Venous    Component Value Date/Time   LATICACIDVEN 1.40 07/11/2017 0545    MICROBIOLOGY: Recent Results (from the past 240 hour(s))  Resp Panel by RT-PCR (Flu A&B, Covid) Nasopharyngeal Swab     Status: Abnormal   Collection Time: 05/15/21  3:26 PM   Specimen: Nasopharyngeal Swab; Nasopharyngeal(NP) swabs in vial transport medium  Result Value Ref Range Status   SARS Coronavirus 2 by RT PCR POSITIVE (A) NEGATIVE Final    Comment: (NOTE) SARS-CoV-2 target nucleic acids are DETECTED.  The SARS-CoV-2 RNA is generally detectable in upper respiratory specimens during the acute phase of infection. Positive results are indicative of the presence of the identified virus, but do not rule out bacterial infection or co-infection with other pathogens not detected by the test. Clinical correlation with patient history and other diagnostic information is necessary to determine patient infection status. The expected result is Negative.  Fact Sheet for Patients: EntrepreneurPulse.com.au  Fact Sheet for Healthcare Providers: IncredibleEmployment.be  This test is not yet approved or cleared by the [place] FDA and  has been authorized for detection and/or diagnosis of SARS-CoV-2 by FDA under an Emergency Use Authorization (EUA).  This EUA will remain in effect  (meaning this test can be used) for the duration of  the COVID-19 declaration under Section 564(b)(1) of the A ct, 21 U.S.C. section 360bbb-3(b)(1), unless the authorization is terminated or revoked sooner.     Influenza A by PCR NEGATIVE NEGATIVE Final   Influenza B by PCR NEGATIVE NEGATIVE Final    Comment: (NOTE) The Xpert Xpress SARS-CoV-2/FLU/RSV plus assay is intended as an aid in the diagnosis of influenza from Nasopharyngeal swab specimens and should not be used as a sole basis for treatment. Nasal washings and aspirates are unacceptable for Xpert Xpress SARS-CoV-2/FLU/RSV testing.  Fact Sheet for Patients: EntrepreneurPulse.com.au  Fact Sheet for Healthcare Providers: IncredibleEmployment.be  This test is not yet approved or cleared by the [place] FDA and has been authorized for detection and/or diagnosis of SARS-CoV-2 by FDA under an Emergency Use Authorization (EUA). This EUA will remain in effect (meaning this test can be used) for the duration of the COVID-19 declaration under Section 564(b)(1) of the Act, 21 U.S.C. section 360bbb-3(b)(1), unless the authorization is terminated or revoked.  Performed at [facility], [place] [address], [place], [place] [address]   Culture, blood (routine x 2)     Status: Abnormal   Collection Time: 05/20/21  8:29 AM  Specimen: BLOOD  Result Value Ref Range Status   Specimen Description BLOOD RIGHT ANTECUBITAL  Final   Special Requests   Final    BOTTLES DRAWN AEROBIC AND ANAEROBIC Blood Culture adequate volume   Culture  Setup Time   Final    GRAM POSITIVE COCCI IN CHAINS IN BOTH AEROBIC AND ANAEROBIC BOTTLES CRITICAL RESULT CALLED TO, READ BACK BY AND VERIFIED WITH: PHARMD G. ABBOTT  05/20/21 @2257  BY JW Performed at [facility], [place] [address] [place], [place] [address]    Culture STREPTOCOCCUS PNEUMONIAE (A)  Final   Report Status 05/22/2021 FINAL  Final   Organism ID,  Bacteria STREPTOCOCCUS PNEUMONIAE  Final      Susceptibility   Streptococcus pneumoniae - MIC*    ERYTHROMYCIN <=0.12 SENSITIVE Sensitive     LEVOFLOXACIN 1 SENSITIVE Sensitive     VANCOMYCIN 0.5 SENSITIVE Sensitive     PENO - penicillin <=0.06      PENICILLIN (non-meningitis) <=0.06 SENSITIVE Sensitive     PENICILLIN (oral) <=0.06 SENSITIVE Sensitive     CEFTRIAXONE (non-meningitis) <=0.12 SENSITIVE Sensitive     * STREPTOCOCCUS PNEUMONIAE  Blood Culture ID Panel (Reflexed)     Status: Abnormal   Collection Time: 05/20/21  8:29 AM  Result Value Ref Range Status   Enterococcus faecalis NOT DETECTED NOT DETECTED Final   Enterococcus Faecium NOT DETECTED NOT DETECTED Final   Listeria monocytogenes NOT DETECTED NOT DETECTED Final   Staphylococcus species NOT DETECTED NOT DETECTED Final   Staphylococcus aureus (BCID) NOT DETECTED NOT DETECTED Final   Staphylococcus epidermidis NOT DETECTED NOT DETECTED Final   Staphylococcus lugdunensis NOT DETECTED NOT DETECTED Final   Streptococcus species DETECTED (A) NOT DETECTED Final    Comment: CRITICAL RESULT CALLED TO, READ BACK BY AND VERIFIED WITH: PHARMD G. ABBOTT  05/20/21 @2257  BY JW    Streptococcus agalactiae NOT DETECTED NOT DETECTED Final   Streptococcus pneumoniae DETECTED (A) NOT DETECTED Final    Comment: CRITICAL RESULT CALLED TO, READ BACK BY AND VERIFIED WITH: PHARMD G. ABBOTT  05/20/21 @2257  BY JW    Streptococcus pyogenes NOT DETECTED NOT DETECTED Final   A.calcoaceticus-baumannii NOT DETECTED NOT DETECTED Final   Bacteroides fragilis NOT DETECTED NOT DETECTED Final   Enterobacterales NOT DETECTED NOT DETECTED Final   Enterobacter cloacae complex NOT DETECTED NOT DETECTED Final   Escherichia coli NOT DETECTED NOT DETECTED Final   Klebsiella aerogenes NOT DETECTED NOT DETECTED Final   Klebsiella oxytoca NOT DETECTED NOT DETECTED Final   Klebsiella pneumoniae NOT DETECTED NOT DETECTED Final   Proteus species NOT DETECTED NOT  DETECTED Final   Salmonella species NOT DETECTED NOT DETECTED Final   Serratia marcescens NOT DETECTED NOT DETECTED Final   Haemophilus influenzae NOT DETECTED NOT DETECTED Final   Neisseria meningitidis NOT DETECTED NOT DETECTED Final   Pseudomonas aeruginosa NOT DETECTED NOT DETECTED Final   Stenotrophomonas maltophilia NOT DETECTED NOT DETECTED Final   Candida albicans NOT DETECTED NOT DETECTED Final   Candida auris NOT DETECTED NOT DETECTED Final   Candida glabrata NOT DETECTED NOT DETECTED Final   Candida krusei NOT DETECTED NOT DETECTED Final   Candida parapsilosis NOT DETECTED NOT DETECTED Final   Candida tropicalis NOT DETECTED NOT DETECTED Final   Cryptococcus neoformans/gattii NOT DETECTED NOT DETECTED Final    Comment: Performed at [facility], [address], [place],  [address]  Culture, blood (routine x 2)     Status: Abnormal   Collection Time: 05/20/21  8:42 AM   Specimen: BLOOD RIGHT HAND  Result Value Ref Range Status   Specimen Description BLOOD RIGHT HAND  Final   Special Requests   Final    BOTTLES DRAWN AEROBIC ONLY Blood Culture results may not be optimal due to an inadequate volume of blood received in culture bottles   Culture  Setup Time   Final    GRAM POSITIVE COCCI IN CHAINS AEROBIC BOTTLE ONLY CRITICAL VALUE NOTED.  VALUE IS CONSISTENT WITH PREVIOUSLY REPORTED AND CALLED VALUE.    Culture (A)  Final    STREPTOCOCCUS PNEUMONIAE SUSCEPTIBILITIES PERFORMED ON PREVIOUS CULTURE WITHIN THE LAST 5 DAYS. Performed at [facility], [place] [address] [place], [place] [address]    Report Status 05/22/2021 FINAL  Final  Culture, blood (routine x 2)     Status: None (Preliminary result)   Collection Time: 05/21/21  1:52 PM   Specimen: BLOOD  Result Value Ref Range Status   Specimen Description BLOOD BLOOD LEFT FOREARM  Final   Special Requests   Final    BOTTLES DRAWN AEROBIC AND ANAEROBIC Blood Culture adequate volume   Culture   Final     NO GROWTH < 24 HOURS Performed at [facility], [place] [address] [place], [place] [address]    Report Status PENDING  Incomplete  Culture, blood (routine x 2)     Status: None (Preliminary result)   Collection Time: 05/21/21  1:52 PM   Specimen: BLOOD  Result Value Ref Range Status   Specimen Description BLOOD BLOOD RIGHT HAND  Final   Special Requests   Final    BOTTLES DRAWN AEROBIC AND ANAEROBIC Blood Culture adequate volume   Culture   Final    NO GROWTH < 24 HOURS Performed at [facility], [place] [address] [place], [place] [address]    Report Status PENDING  Incomplete    RADIOLOGY STUDIES/RESULTS: MR CERVICAL SPINE WO CONTRAST  Result Date: 05/22/2021 CLINICAL DATA:  Neck pain, infection suspected, positive xray/CT strep bacteremia; Mid-back pain strep bacteremia-r/o osteo/diskitis; Low back pain, infection suspected, positive xray/CT strep bacteremia-r/o osteo/diskitis EXAM: MRI CERVICAL, THORACIC AND LUMBAR SPINE WITHOUT CONTRAST TECHNIQUE: Multiplanar and multiecho pulse sequences of the cervical spine, to include the craniocervical junction and cervicothoracic junction, and thoracic and lumbar spine, were obtained without intravenous contrast. COMPARISON:  None. FINDINGS: Motion artifact is present, greatest at lumbar levels. MRI CERVICAL SPINE Alignment: Trace anterolisthesis at C3-C4. Vertebrae: Mild multilevel degenerative endplate irregularity. Mild, likely degenerative endplate marrow edema at C3-C4. Cord: No abnormal signal. Posterior Fossa, vertebral arteries, paraspinal tissues: Parenchymal volume loss. 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 Disc levels: C2-C3:  No significant stenosis. C3-C4: Anterolisthesis. Uncovered disc bulge with endplate osteophytes. Mild canal and right foraminal stenosis. No left foraminal stenosis. C4-C5: Disc bulge with small left central disc protrusion. No canal or foraminal stenosis. C5-C6: Minimal disc bulge with endplate  osteophytes. No canal or foraminal stenosis. C6-C7: Minimal disc bulge with endplate osteophytes. No canal or foraminal stenosis. C7-T1:  No stenosis. MRI THORACIC SPINE Alignment:  Preserved. Vertebrae: Mild degenerative endplate irregularity. Schmorl's nodes are present, for example inferior endplate T12 where there is mild associated marrow edema. Cord:  No abnormal signal. Paraspinal and other soft tissues: 1.4 cm nodular lesion of the left lower lobe. Disc levels: Minor degenerative changes are present. There is no canal or foraminal stenosis. MRI LUMBAR SPINE Segmentation:  Standard. Alignment:  Preserved. Vertebrae: Vertebral body heights are maintained.  Mild marrow edema associated with L4-L5 facet arthropathy. Conus medullaris and cauda equina: Conus extends to the L1-L2 level. Conus and cauda equina appear normal. Paraspinal and other soft tissues: Unremarkable. Disc levels: No canal or foraminal stenosis at any level. Lower lumbar facet arthropathy is greatest at L4-L5 with there are joint effusions. IMPRESSION: Degraded by motion artifact, particularly at lumbar levels. No evidence of discitis/osteomyelitis. Degenerative changes without high-grade stenosis. Facet arthropathy is greatest at L4-L5 where there are joint effusions; septic arthritis is possible but is not considered likely. 1.4 cm left lower lobe nodular lesion. Electronically Signed   By: Macy Mis M.D.   On: 05/22/2021 13:33   MR THORACIC SPINE WO CONTRAST  Result Date: 05/22/2021 CLINICAL DATA:  Neck pain, infection suspected, positive xray/CT strep bacteremia; Mid-back pain strep bacteremia-r/o osteo/diskitis; Low back pain, infection suspected, positive xray/CT strep bacteremia-r/o osteo/diskitis EXAM: MRI CERVICAL, THORACIC AND LUMBAR SPINE WITHOUT CONTRAST TECHNIQUE: Multiplanar and multiecho pulse sequences of the cervical spine, to include the craniocervical junction and cervicothoracic junction, and thoracic and lumbar  spine, were obtained without intravenous contrast. COMPARISON:  None. FINDINGS: Motion artifact is present, greatest at lumbar levels. MRI CERVICAL SPINE Alignment: Trace anterolisthesis at C3-C4. Vertebrae: Mild multilevel degenerative endplate irregularity. Mild, likely degenerative endplate marrow edema at C3-C4. Cord: No abnormal signal. Posterior Fossa, vertebral arteries, paraspinal tissues: Parenchymal volume loss. 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 Disc levels: C2-C3:  No significant stenosis. C3-C4: Anterolisthesis. Uncovered disc bulge with endplate osteophytes. Mild canal and right foraminal stenosis. No left foraminal stenosis. C4-C5: Disc bulge with small left central disc protrusion. No canal or foraminal stenosis. C5-C6: Minimal disc bulge with endplate osteophytes. No canal or foraminal stenosis. C6-C7: Minimal disc bulge with endplate osteophytes. No canal or foraminal stenosis. C7-T1:  No stenosis. MRI THORACIC SPINE Alignment:  Preserved. Vertebrae: Mild degenerative endplate irregularity. Schmorl's nodes are present, for example inferior endplate T12 where there is mild associated marrow edema. Cord:  No abnormal signal. Paraspinal and other soft tissues: 1.4 cm nodular lesion of the left lower lobe. Disc levels: Minor degenerative changes are present. There is no canal or foraminal stenosis. MRI LUMBAR SPINE Segmentation:  Standard. Alignment:  Preserved. Vertebrae: Vertebral body heights are maintained. Mild marrow edema associated with L4-L5 facet arthropathy. Conus medullaris and cauda equina: Conus extends to the L1-L2 level. Conus and cauda equina appear normal. Paraspinal and other soft tissues: Unremarkable. Disc levels: No canal or foraminal stenosis at any level. Lower lumbar facet arthropathy is greatest at L4-L5 with there are joint effusions. IMPRESSION: Degraded by motion artifact, particularly at lumbar levels. No evidence of discitis/osteomyelitis. Degenerative  changes without high-grade stenosis. Facet arthropathy is greatest at L4-L5 where there are joint effusions; septic arthritis is possible but is not considered likely. 1.4 cm left lower lobe nodular lesion. Electronically Signed   By: Macy Mis M.D.   On: 05/22/2021 13:33   MR LUMBAR SPINE WO CONTRAST  Result Date: 05/22/2021 CLINICAL DATA:  Neck pain, infection suspected, positive xray/CT strep bacteremia; Mid-back pain strep bacteremia-r/o osteo/diskitis; Low back pain, infection suspected, positive xray/CT strep bacteremia-r/o osteo/diskitis EXAM: MRI CERVICAL, THORACIC AND LUMBAR SPINE WITHOUT CONTRAST TECHNIQUE: Multiplanar and multiecho pulse sequences of the cervical spine, to include the craniocervical junction and cervicothoracic junction, and thoracic and lumbar spine, were obtained without intravenous contrast. COMPARISON:  None. FINDINGS: Motion artifact is present, greatest at lumbar levels. MRI CERVICAL SPINE Alignment: Trace anterolisthesis at C3-C4. Vertebrae: Mild multilevel degenerative endplate irregularity. Mild,  likely degenerative endplate marrow edema at C3-C4. Cord: No abnormal signal. Posterior Fossa, vertebral arteries, paraspinal tissues: Parenchymal volume loss. 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 Disc levels: C2-C3:  No significant stenosis. C3-C4: Anterolisthesis. Uncovered disc bulge with endplate osteophytes. Mild canal and right foraminal stenosis. No left foraminal stenosis. C4-C5: Disc bulge with small left central disc protrusion. No canal or foraminal stenosis. C5-C6: Minimal disc bulge with endplate osteophytes. No canal or foraminal stenosis. C6-C7: Minimal disc bulge with endplate osteophytes. No canal or foraminal stenosis. C7-T1:  No stenosis. MRI THORACIC SPINE Alignment:  Preserved. Vertebrae: Mild degenerative endplate irregularity. Schmorl's nodes are present, for example inferior endplate T12 where there is mild associated marrow edema. Cord:   No abnormal signal. Paraspinal and other soft tissues: 1.4 cm nodular lesion of the left lower lobe. Disc levels: Minor degenerative changes are present. There is no canal or foraminal stenosis. MRI LUMBAR SPINE Segmentation:  Standard. Alignment:  Preserved. Vertebrae: Vertebral body heights are maintained. Mild marrow edema associated with L4-L5 facet arthropathy. Conus medullaris and cauda equina: Conus extends to the L1-L2 level. Conus and cauda equina appear normal. Paraspinal and other soft tissues: Unremarkable. Disc levels: No canal or foraminal stenosis at any level. Lower lumbar facet arthropathy is greatest at L4-L5 with there are joint effusions. IMPRESSION: Degraded by motion artifact, particularly at lumbar levels. No evidence of discitis/osteomyelitis. Degenerative changes without high-grade stenosis. Facet arthropathy is greatest at L4-L5 where there are joint effusions; septic arthritis is possible but is not considered likely. 1.4 cm left lower lobe nodular lesion. Electronically Signed   By: Macy Mis M.D.   On: 05/22/2021 13:33   CT WRIST LEFT W CONTRAST  Result Date: 05/20/2021 CLINICAL DATA:  Left wrist pain. History of lupus. Evaluate for septic arthritis. EXAM: CT OF THE UPPER LEFT EXTREMITY WITH CONTRAST TECHNIQUE: Multidetector CT imaging of the left wrist was performed according to the standard protocol following intravenous contrast administration. RADIATION DOSE REDUCTION: This exam was performed according to the departmental dose-optimization program which includes automated exposure control, adjustment of the mA and/or kV according to patient size and/or use of iterative reconstruction technique. CONTRAST:  168mL OMNIPAQUE IOHEXOL 300 MG/ML  SOLN COMPARISON:  Left wrist x-rays from same day. FINDINGS: Bones/Joint/Cartilage No bony destruction or periosteal reaction. No fracture or dislocation. Joint spaces are preserved. No joint effusion. Ligaments Ligaments are suboptimally  evaluated by CT. Muscles and Tendons Grossly intact. Soft tissue No fluid collection or subcutaneous emphysema.  No soft tissue mass. IMPRESSION: 1. No acute osseous abnormality.  No joint effusion. Electronically Signed   By: Titus Dubin M.D.   On: 05/20/2021 16:06   DG Chest Port 1V same Day  Result Date: 05/21/2021 CLINICAL DATA:  Shortness of breath EXAM: PORTABLE CHEST 1 VIEW COMPARISON:  05/15/2021 FINDINGS: Artifact from EKG leads. Chronic cardiomegaly. Mild atelectatic type opacity at the bases. Paramediastinal bleb at the aortic arch. There is no edema, consolidation, effusion, or pneumothorax. IMPRESSION: Stable exam.  No acute finding. Electronically Signed   By: Jorje Guild M.D.   On: 05/21/2021 08:53   ECHOCARDIOGRAM COMPLETE  Result Date: 05/21/2021    ECHOCARDIOGRAM REPORT   Patient Name:   MYLENE BOW Date of Exam: 05/21/2021 Medical Rec #:  [id number]          Height:       64.0 in Accession #:    [id number]         Weight:  105.6 lb Date of Birth:  11-24-1960          BSA:          1.491 m Patient Age:    30 years           BP:           124/76 mmHg Patient Gender: F                  HR:           82 bpm. Exam Location:  Inpatient Procedure: 2D Echo Indications:    Bacteremia  History:        Patient has prior history of Echocardiogram examinations, most                 recent 05/13/2020. COPD; Risk Factors:Hypertension.  Sonographer:    Jefferey Pica Referring Phys: [place]  1. Left ventricular ejection fraction, by estimation, is 45 to 50%. The left ventricle has mildly decreased function. The left ventricle demonstrates global hypokinesis. Left ventricular diastolic parameters are consistent with Grade I diastolic dysfunction (impaired relaxation).  2. Right ventricular systolic function is mildly reduced. The right ventricular size is normal. There is normal pulmonary artery systolic pressure. The estimated right ventricular systolic  pressure is 03.8 mmHg.  3. Left atrial size was mildly dilated.  4. The mitral valve is normal in structure. No evidence of mitral valve regurgitation. No evidence of mitral stenosis.  5. The aortic valve is tricuspid. Aortic valve regurgitation is mild. Aortic valve sclerosis/calcification is present, without any evidence of aortic stenosis.  6. Aortic dilatation noted. There is mild dilatation of the aortic root and of the ascending aorta, measuring 40 mm.  7. The inferior vena cava is normal in size with greater than 50% respiratory variability, suggesting right atrial pressure of 3 mmHg. FINDINGS  Left Ventricle: Left ventricular ejection fraction, by estimation, is 45 to 50%. The left ventricle has mildly decreased function. The left ventricle demonstrates global hypokinesis. The left ventricular internal cavity size was normal in size. There is  no left ventricular hypertrophy. Left ventricular diastolic parameters are consistent with Grade I diastolic dysfunction (impaired relaxation). Right Ventricle: The right ventricular size is normal. No increase in right ventricular wall thickness. Right ventricular systolic function is mildly reduced. There is normal pulmonary artery systolic pressure. The tricuspid regurgitant velocity is 1.81 m/s, and with an assumed right atrial pressure of 3 mmHg, the estimated right ventricular systolic pressure is 33.3 mmHg. Left Atrium: Left atrial size was mildly dilated. Right Atrium: Right atrial size was normal in size. Pericardium: There is no evidence of pericardial effusion. Mitral Valve: The mitral valve is normal in structure. There is mild calcification of the mitral valve leaflet(s). Mild mitral annular calcification. No evidence of mitral valve regurgitation. No evidence of mitral valve stenosis. Tricuspid Valve: The tricuspid valve is normal in structure. Tricuspid valve regurgitation is trivial. Aortic Valve: The aortic valve is tricuspid. Aortic valve  regurgitation is mild. Aortic regurgitation PHT measures 384 msec. Aortic valve sclerosis/calcification is present, without any evidence of aortic stenosis. Aortic valve peak gradient measures 7.3  mmHg. Pulmonic Valve: The pulmonic valve was normal in structure. Pulmonic valve regurgitation is trivial. Aorta: Aortic dilatation noted. There is mild dilatation of the aortic root and of the ascending aorta, measuring 40 mm. Venous: The inferior vena cava is normal in size with greater than 50% respiratory variability, suggesting right atrial pressure of 3 mmHg. IAS/Shunts:  No atrial level shunt detected by color flow Doppler.  LEFT VENTRICLE PLAX 2D LVIDd:         4.10 cm   Diastology LVIDs:         3.20 cm   LV e' medial:    7.35 cm/s LV PW:         1.00 cm   LV E/e' medial:  10.0 LV IVS:        1.00 cm   LV e' lateral:   10.70 cm/s LVOT diam:     2.00 cm   LV E/e' lateral: 6.9 LV SV:         54 LV SV Index:   36 LVOT Area:     3.14 cm  RIGHT VENTRICLE             IVC RV Basal diam:  2.40 cm     IVC diam: 1.30 cm RV S prime:     10.10 cm/s TAPSE (M-mode): 1.6 cm LEFT ATRIUM              Index        RIGHT ATRIUM           Index LA diam:        2.70 cm  1.81 cm/m   RA Area:     13.90 cm LA Vol (A2C):   114.0 ml 76.44 ml/m  RA Volume:   36.00 ml  24.14 ml/m LA Vol (A4C):   43.1 ml  28.90 ml/m LA Biplane Vol: 70.2 ml  47.07 ml/m  AORTIC VALVE                 PULMONIC VALVE AV Area (Vmax): 2.64 cm     PV Vmax:       0.71 m/s AV Vmax:        135.50 cm/s  PV Peak grad:  2.0 mmHg AV Peak Grad:   7.3 mmHg LVOT Vmax:      114.00 cm/s LVOT Vmean:     66.550 cm/s LVOT VTI:       0.172 m AI PHT:         384 msec  AORTA Ao Root diam: 4.00 cm Ao Asc diam:  4.00 cm MITRAL VALVE               TRICUSPID VALVE MV Area (PHT): 3.28 cm    TR Peak grad:   13.1 mmHg MV Decel Time: 231 msec    TR Vmax:        181.00 cm/s MV E velocity: 73.30 cm/s MV A velocity: 71.10 cm/s  SHUNTS MV E/A ratio:  1.03        Systemic VTI:  0.17 m                             Systemic Diam: 2.00 cm Dalton McleanMD Electronically signed by Franki Monte Signature Date/Time: 05/21/2021/5:41:51 PM    Final      LOS: 6 days   Oren Binet, MD  [facility]    To contact the attending provider between 7A-7P or the covering provider during after hours 7P-7A, please log into the web site [email or web address] and access using universal [place] password for that web site. If you do not have the password, please call the hospital operator.  05/22/2021, 2:05 PM

## 2021-05-22 NOTE — Progress Notes (Signed)
Occupational Therapy Treatment Patient Details Name: Kathleen [name] MRN: [id number] DOB: Jan 24, 1961 Today's Date: 05/22/2021   History of present illness Pt is a 61 y.o. female who presented 05/15/21 with weakness. Pt with GI bleed. work up underway, but current diagnosis:  FTT, chronic combined systolic/diastolic HF, COVID -13  PMH: ESRD on HD, COPD, HTN, migraine, lupus, seziures, anemia, cardiac arrest hx, cellulitis, cerebral vasculitis, hx of suicide attempt, TB, TIA   OT comments  Pt needing encouragement for OOB due to back pain and fatigue. Participated in grooming and UB dressing with set up at EOB. Transferred to [facility] and back to bed in attempt to have BM with min (hand held) assist. Encouraged pt to use BSC or walk to bathroom, she reports primarily using bed pan. Instructed in log roll technique to minimize back pain with bed mobility.    Recommendations for follow up therapy are one component of a multi-disciplinary discharge planning process, led by the attending physician.  Recommendations may be updated based on patient status, additional functional criteria and insurance authorization.    Follow Up Recommendations  Skilled nursing-short term rehab (<3 hours/day)    Assistance Recommended at Discharge Frequent or constant Supervision/Assistance  Patient can return home with the following  A little help with bathing/dressing/bathroom;A little help with walking and/or transfers   Equipment Recommendations  None recommended by OT    Recommendations for Other Services      Precautions / Restrictions Precautions Precautions: Fall       Mobility Bed Mobility Overal bed mobility: Needs Assistance Bed Mobility: Rolling;Sidelying to Sit;Sit to Sidelying Rolling: Min assist Sidelying to sit: Min assist     Sit to sidelying: Min assist General bed mobility comments: cues for log roll technique to minimize back pain    Transfers Overall transfer level: Needs  assistance Equipment used: 1 person hand held assist Transfers: Sit to/from Stand Sit to Stand: Min assist Stand pivot transfers: Min assist         General transfer comment: steadying assist, cues for safety     Balance Overall balance assessment: Needs assistance Sitting-balance support: No upper extremity supported;Feet supported Sitting balance-Leahy Scale: Fair     Standing balance support: Single extremity supported Standing balance-Leahy Scale: Poor Standing balance comment: relies on at least 1 UE support.                           ADL either performed or assessed with clinical judgement   ADL Overall ADL's : Needs assistance/impaired Eating/Feeding: Set up;Sitting   Grooming: Wash/dry hands;Wash/dry face;Sitting;Set up           Upper Body Dressing : Set up;Sitting Upper Body Dressing Details (indicate cue type and reason): changed soiled gown     Toilet Transfer: Minimal assistance;BSC/3in1;Stand-pivot   Toileting- Clothing Manipulation and Hygiene: Minimal assistance;Sit to/from stand              Extremity/Trunk Assessment              Vision       Perception     Praxis      Cognition Arousal/Alertness: Awake/alert Behavior During Therapy: Impulsive Overall Cognitive Status: Impaired/Different from baseline Area of Impairment: Safety/judgement                         Safety/Judgement: Decreased awareness of safety  Exercises     Shoulder Instructions       General Comments      Pertinent Vitals/ Pain       Pain Assessment: Faces Faces Pain Scale: Hurts even more Pain Location: back/spine Pain Descriptors / Indicators: Grimacing;Guarding Pain Intervention(s): Monitored during session;Repositioned  Home Living                                          Prior Functioning/Environment              Frequency  Min 2X/week        Progress Toward Goals  OT  Goals(current goals can now be found in the care plan section)  Progress towards OT goals: Not progressing toward goals - comment (back pain limiting)  Acute Rehab OT Goals OT Goal Formulation: With patient Time For Goal Achievement: 05/30/21 Potential to Achieve Goals: Good  Plan Discharge plan remains appropriate    Co-evaluation                 AM-PAC OT "6 Clicks" Daily Activity     Outcome Measure   Help from another person eating meals?: None Help from another person taking care of personal grooming?: None Help from another person toileting, which includes using toliet, bedpan, or urinal?: A Little Help from another person bathing (including washing, rinsing, drying)?: A Lot Help from another person to put on and taking off regular upper body clothing?: A Little Help from another person to put on and taking off regular lower body clothing?: A Lot 6 Click Score: 18    End of Session    OT Visit Diagnosis: Unsteadiness on feet (R26.81);Pain   Activity Tolerance Patient limited by pain   Patient Left in bed;with call bell/phone within reach;with bed alarm set   Nurse Communication          Time: 1062-6948 OT Time Calculation (min): 18 min  Charges: OT General Charges $OT Visit: 1 Visit OT Treatments $Self Care/Home Management : 8-22 mins  Nestor Lewandowsky, OTR/L Acute Rehabilitation Services Pager: [phone] Office: [phone]   Kathleen [name] 05/22/2021, 4:10 PM

## 2021-05-22 NOTE — Progress Notes (Signed)
[facility] Progress Note   Subjective: Patient not seen directly today given COVID-19 + status, utilizing data taken from chart +/- discussions w/ providers and staff.    Objective Vitals:   05/22/21 0430 05/22/21 0500 05/22/21 0509 05/22/21 0915  BP: 104/65 119/74 130/87 95/63  Pulse: 79 75 72 80  Resp:  20  20  Temp:  98.2 F (36.8 C) 98.4 F (36.9 C) 98.2 F (36.8 C)  TempSrc:  Oral  Oral  SpO2: 100% 100% 100% 100%  Weight:       Physical Exam  Patient not seen directly today given COVID-19 + status, utilizing data taken from chart +/- discussions w/ providers and staff.  Dialysis Orders:      Background:  Kathleen [name] is a 61 y.o. female with a history of ESRD on HD, COPD, HTN, migraine, lupus, and hx of seizures who presented to the hospital with weakness. She states that she was too weak to go to her last dialysis treatment and the ER has observed that she's too weak to get off of the bed.  Nephrology consulted for assistance with management of HD.+ COVID 19.   [name]   3h 77min  50.8kg 350/1.5  2/2 bath F160  AVG    - last post weight 1/3 was 48.2 kg  - sensipar 60 mg three times a week  - hectorol 6 mcg IV tiw  - venofer 100 mg weekly  - mircera 75 mcg q 2wks last 12/20, held 1/3 due to [facility]     CXR 1/06 - IMPRESSION: No active cardiopulmonary disease.   Assessment/Plan: Generalized weakness - felt secondary to covid, +esrd, multifactorial.  Covid PNA+ - per pmd Strep pneumoniae bacteremia - in 2 sets. Seen by ID, switched to IV PCN.  ESRD - HD on TTS schedule. HD tomorrow on schedule.  BP/ volume - still 2 kg under dry wt, losing body wt. Keep even w/ HD Sat GI bleed - per primary team. Normal admit Hb > 10. Follow.   Anemia ckd - now + acute blood loss but Hb above goal. Will defer ESA, note recent OP dose was held as well.  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 Cont renvela w/ meals.  SLE per primary Disposition - per  pmd  [name], MD 05/22/2021, 3:50 PM      Additional Objective Labs: Basic Metabolic Panel: Recent Labs  Lab 05/20/21 0142 05/21/21 0222 05/22/21 0549  NA 135 131* 129*  K 3.9 3.8 4.3  CL 94* 89* 87*  CO2 26 26 27   GLUCOSE 105* 110* 104*  BUN 33* 55* 88*  CREATININE 7.21* 9.42* 11.24*  CALCIUM 7.2* 7.3* 8.0*  PHOS 2.8 4.3 4.1    Liver Function Tests: Recent Labs  Lab 05/15/21 1549 05/17/21 0313 05/20/21 0142 05/21/21 0222 05/22/21 0549  AST 43*  --   --   --   --   ALT 12  --   --   --   --   ALKPHOS 66  --   --   --   --   BILITOT 0.7  --   --   --   --   PROT 9.1*  --   --   --   --   ALBUMIN 4.0   < > 2.6* 2.3* 2.2*   < > = values in this interval not displayed.    No results for input(s): LIPASE, AMYLASE in the last 168 hours. CBC:  Recent Labs  Lab 05/15/21 1549 05/15/21 2301 05/18/21 0239 05/19/21 0634 05/20/21 0815 05/21/21 0222 05/22/21 0549  WBC 13.6*   < > 8.9 9.4 13.8* 17.5* 18.5*  NEUTROABS 11.7*  --   --   --   --   --   --   HGB 15.7*   < > 12.8 11.5* 12.1 10.7* 10.3*  HCT 50.4*   < > 38.2 35.1* 38.0 32.6* 29.9*  MCV 92.8   < > 87.0 88.2 88.6 85.1 84.7  PLT 150   < > 164 171 163 179 199   < > = values in this interval not displayed.    Blood Culture    Component Value Date/Time   SDES BLOOD BLOOD LEFT FOREARM 05/21/2021 1352   SDES BLOOD BLOOD RIGHT HAND 05/21/2021 1352   SPECREQUEST  05/21/2021 1352    BOTTLES DRAWN AEROBIC AND ANAEROBIC Blood Culture adequate volume   SPECREQUEST  05/21/2021 1352    BOTTLES DRAWN AEROBIC AND ANAEROBIC Blood Culture adequate volume   CULT  05/21/2021 1352    NO GROWTH < 24 HOURS Performed at [facility], [employer] [address], [place], [place] [address]    CULT  05/21/2021 1352    NO GROWTH < 24 HOURS Performed at [facility], [place] [address] [place], [place] [address]    REPTSTATUS PENDING 05/21/2021 1352   REPTSTATUS PENDING 05/21/2021 1352    Cardiac Enzymes: No results  for input(s): CKTOTAL, CKMB, CKMBINDEX, TROPONINI in the last 168 hours. CBG: Recent Labs  Lab 05/19/21 0654  GLUCAP 141*   Medications:  sodium chloride     pencillin G potassium IV 2 Million Units (05/22/21 1448)    Chlorhexidine Gluconate Cloth  6 each Topical Q0600   cinacalcet  60 mg Oral Once per day on Mon Wed Fri   cinacalcet  60 mg Oral Q T,Th,Sa-HD   famotidine  20 mg Oral Daily   feeding supplement (NEPRO CARB STEADY)  237 mL Oral TID BM   heparin injection (subcutaneous)  5,000 Units Subcutaneous Q8H   multivitamin  1 tablet Oral QHS   nicotine  14 mg Transdermal Daily   predniSONE  40 mg Oral Q breakfast   sevelamer carbonate  1,600 mg Oral TID WC   sodium chloride flush  3 mL Intravenous Q12H

## 2021-05-22 NOTE — Progress Notes (Signed)
Interventional Radiology Brief Note:  Patient admitted with failure to thrive in the setting of ESRD on HD, COPD, lupus after she was too weak to go to dialysis at home. She was found to have strep bacteremia potentially initiating from COVID pneumonia.  Unfortunately her hospital course has been further complicated by severe arthralgias.   MR Lumbar Spine showed: Degenerative changes without high-grade stenosis. Facet arthropathy is greatest at L4-L5 where there are joint effusions; septic arthritis is possible but is not considered likely.  IR consulted for possible aspiration, however after review of imaging by Dr. Karenann Cai there does not appear to fluid amendable to aspiration.   No procedure planned in IR.  ID aware.  Brynda Greathouse, MS RD PA-C

## 2021-05-22 NOTE — Progress Notes (Signed)
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    # Strep pneumoniae bacteremia #COVID infection SP remdesivir x 3days #ESRD on HD # Possible septic arthritis of L4-L5 -Pt has a productive cough, suspect that she has superimposed pneumonia leading to bacteremia. Although CXR today did not show consolidation and pt is no RA. Ordered sputum Cx. Repeated blood Cx to ensure clearance.  -TTE did not show valvular vegetation (05/21/21). Ordered TEE -MRI spine showed possible septic arthritis of L4-L5. Would get aspirates and Cx.      #Left wrist pain/tenderness-improving #LUE AVF #SLE with non adherence to plaquenil -Clinically wrist pain seems consistent with a lupus arthritis/flare rather than cellulitis.  Pt reprots she has wrist pain for "a while". The CT w/ contrast of left wrist did not show an effusion or signs of infection. She has improved ROM and less tenderness on Day 3 of prednisone.   Recommendations:  -D/C ceftriaxone -Start Penicillin IV  -Sputum Cx -Continue steroids wrist -Repeat blood Cx -Obtain TEE given bacteremia with suspicion of embolization to spine -Engage IR for aspiration of L4-L5  Microbiology:   Antibiotics: Ceftriaxone 1/11-p   Cultures: Blood 1/11 strep pneumoniae    SUBJECTIVE: Pt is eating in bed. She reports her wrist feels better.   Review of  Systems: Review of Systems  All other systems reviewed and are negative.   Scheduled Meds:  Chlorhexidine Gluconate Cloth  6 each Topical Q0600   cinacalcet  60 mg Oral Once per day on Mon Wed Fri   cinacalcet  60 mg Oral Q T,Th,Sa-HD   famotidine  20 mg Oral Daily   feeding supplement (NEPRO CARB STEADY)  237 mL Oral TID BM   heparin injection (subcutaneous)  5,000 Units Subcutaneous Q8H   multivitamin  1 tablet Oral QHS   nicotine  14 mg Transdermal Daily   predniSONE  40 mg Oral Q breakfast   sevelamer carbonate  1,600 mg Oral TID WC   sodium chloride flush  3 mL Intravenous Q12H   Continuous Infusions:  sodium chloride     pencillin G potassium IV     PRN Meds:.sodium chloride, acetaminophen **OR** acetaminophen, LORazepam, oxyCODONE, sodium chloride flush No Known Allergies  OBJECTIVE: Vitals:   05/22/21 0430 05/22/21 0500 05/22/21 0509 05/22/21 0915  BP: 104/65 119/74 130/87 95/63  Pulse: 79 75 72 80  Resp:  20  20  Temp:  98.2 F (36.8 C) 98.4 F (36.9 C) 98.2 F (36.8 C)  TempSrc:  Oral  Oral  SpO2: 100% 100% 100% 100%  Weight:       Body mass index is 18.13 kg/m.  Physical Exam Constitutional:      Appearance: Normal appearance.  HENT:     Head: Normocephalic and atraumatic.     Right Ear:  Tympanic membrane normal.     Left Ear: Tympanic membrane normal.     Nose: Nose normal.     Mouth/Throat:     Mouth: Mucous membranes are moist.  Eyes:     Extraocular Movements: Extraocular movements intact.     Conjunctiva/sclera: Conjunctivae normal.     Pupils: Pupils are equal, round, and reactive to light.  Cardiovascular:     Rate and Rhythm: Normal rate and regular rhythm.     Heart sounds: No murmur heard.   No friction rub. No gallop.  Pulmonary:     Effort: Pulmonary effort is normal.     Breath sounds: Normal breath sounds.  Abdominal:     General: Abdomen is flat.     Palpations: Abdomen is soft.  Musculoskeletal:     Comments: Left wrist  less tender, Increased ROM  Skin:    General: Skin is warm and dry.  Neurological:     General: No focal deficit present.     Mental Status: She is alert and oriented to person, place, and time.  Psychiatric:        Mood and Affect: Mood normal.      Lab Results Lab Results  Component Value Date   WBC 18.5 (H) 05/22/2021   HGB 10.3 (L) 05/22/2021   HCT 29.9 (L) 05/22/2021   MCV 84.7 05/22/2021   PLT 199 05/22/2021    Lab Results  Component Value Date   CREATININE 11.24 (H) 05/22/2021   BUN 88 (H) 05/22/2021   NA 129 (L) 05/22/2021   K 4.3 05/22/2021   CL 87 (L) 05/22/2021   CO2 27 05/22/2021    Lab Results  Component Value Date   ALT 12 05/15/2021   AST 43 (H) 05/15/2021   ALKPHOS 66 05/15/2021   BILITOT 0.7 05/15/2021        Laurice Record, [facility] 05/22/2021, 1:59 PM

## 2021-05-23 LAB — ENA+DNA/DS+ANTICH+CENTRO+JO...
Anti JO-1: <0.2 AI (ref 0.0–0.9)
Centromere Ab Screen: >8 AI — ABNORMAL HIGH (ref 0.0–0.9)
Chromatin Ab SerPl-aCnc: 0.2 AI (ref 0.0–0.9)
ENA SM Ab Ser-aCnc: <0.2 AI (ref 0.0–0.9)
Ribonucleic Protein: 0.5 AI (ref 0.0–0.9)
SSA (Ro) (ENA) Antibody, IgG: <0.2 AI (ref 0.0–0.9)
SSB (La) (ENA) Antibody, IgG: <0.2 AI (ref 0.0–0.9)
Scleroderma (Scl-70) (ENA) Antibody, IgG: <0.2 AI (ref 0.0–0.9)
ds DNA Ab: 1 IU/mL (ref 0–9)

## 2021-05-23 LAB — RENAL FUNCTION PANEL
Albumin: 2.1 g/dL — ABNORMAL LOW (ref 3.5–5.0)
Anion gap: 15 (ref 5–15)
BUN: 65 mg/dL — ABNORMAL HIGH (ref 6–20)
CO2: 26 mmol/L (ref 22–32)
Calcium: 8.8 mg/dL — ABNORMAL LOW (ref 8.9–10.3)
Chloride: 94 mmol/L — ABNORMAL LOW (ref 98–111)
Creatinine, Ser: 7.04 mg/dL — ABNORMAL HIGH (ref 0.44–1.00)
GFR, Estimated: 6 mL/min — ABNORMAL LOW (ref >60)
Glucose, Bld: 115 mg/dL — ABNORMAL HIGH (ref 70–99)
Phosphorus: 2.9 mg/dL (ref 2.5–4.6)
Potassium: 4.3 mmol/L (ref 3.5–5.1)
Sodium: 135 mmol/L (ref 135–145)

## 2021-05-23 LAB — CBC
HCT: 30 % — ABNORMAL LOW (ref 36.0–46.0)
Hemoglobin: 9.9 g/dL — ABNORMAL LOW (ref 12.0–15.0)
MCH: 28.7 pg (ref 26.0–34.0)
MCHC: 33 g/dL (ref 30.0–36.0)
MCV: 87 fL (ref 80.0–100.0)
Platelets: 178 10*3/uL (ref 150–400)
RBC: 3.45 MIL/uL — ABNORMAL LOW (ref 3.87–5.11)
RDW: 19.3 % — ABNORMAL HIGH (ref 11.5–15.5)
WBC: 12 10*3/uL — ABNORMAL HIGH (ref 4.0–10.5)
nRBC: 0 % (ref 0.0–0.2)

## 2021-05-23 LAB — MAGNESIUM: Magnesium: 2.5 mg/dL — ABNORMAL HIGH (ref 1.7–2.4)

## 2021-05-23 LAB — ANA W/REFLEX IF POSITIVE: Anti Nuclear Antibody (ANA): POSITIVE — AB

## 2021-05-23 NOTE — Progress Notes (Signed)
Patient has c/o pain and swelling IV site while IVABT/Penicillin was running. Swelling noted to IVsite, IVABT was then stopped. Removed PIV and X. Blount,NP made aware. PRN pain med given for pain and cold compress was applied. New consult for [facility] was put in.  Call bell within reach and will continue to close monitor.

## 2021-05-23 NOTE — Progress Notes (Signed)
PROGRESS NOTE        PATIENT DETAILS Name: Kathleen [name] Age: 61 y.o. Sex: female Date of Birth: 1960/07/05 Admit Date: 05/15/2021 Admitting Physician Orma Flaming, MD ZOX:WRUEA, Beverly Gust, NP  Brief Narrative:  Patient is a 61 y.o. female with history of ESRD on HD TTS, HFrEF, SLE, anemia due to CKD, HTN-who presented with at least 1 week history of worsening weakness/lethargy, intermittent diarrhea, hematochezia-she had missed her last 3 HD sessions prior to hospitalization due to weakness/lethargy.  She was found to have COVID-19 infection-and thought to have uremic encephalopathy in the setting of missed HD.  She stabilized with Remdesivir and hemodialysis.  Unfortunately-Hospital course was complicated by severe arthralgias mostly in her left wrist/spine-low-grade fever-she was subsequently found to have streptococcal pneumonia bacteremia.  See below for further details.  Significant Hospital events 1/06>> admit with diarrhea/intermittent hematochezia from COVID-uremic encephalopathy from missed HD. 1/10>> developed arthralgias-mostly in her left wrist.Prednisone ordered 1/11>> low-grade fever-worsening left wrist arthralgia-CT left wrist negative for effusion.   1/11>> blood culture positive Streptococcus pneumoniae-started on IV Rocephin.  Subjective: Slow improvement in left wrist arthralgias continue.  Back pain better.  Appears chronically frail.  Not in any distress.  She was sitting up and eating breakfast this morning.  Objective: Vitals: Blood pressure 117/89, pulse 79, temperature 97.8 F (36.6 C), temperature source Oral, resp. rate 19, weight 48.9 kg, SpO2 100 %.   Exam: Gen Exam:Alert awake-not in any distress HEENT:atraumatic, normocephalic Chest: B/L clear to auscultation anteriorly CVS:S1S2 regular Abdomen:soft non tender, non distended Extremities:no edema.  Left wrist-still tender but no swelling-however arthralgias have  decreased. Neurology: Non focal Skin: no rash   Pertinent Labs/Radiology: Recent Labs  Lab 05/23/21 0410  WBC 12.0*  HGB 9.9*  PLT 178  NA 135  K 4.3  CREATININE 7.04*    Assessment/Plan: COVID-19 infection: Improved-diarrhea has resolved-no PNA on CXR-completed 3 days of Remdesivir   Diarrhea with intermittent hematochezia: Diarrhea due to COVID-19 infection-suspect hematochezia was in the setting of some mucosal sloughing.  Hematochezia and diarrhea have resolved.  Given significant debility/deconditioning/stable hemoglobin-suspect we can postpone endoscopic evaluation to the outpatient setting.  Discussed this with patient-she agrees.    Acute metabolic encephalopathy: Resolved.  Due to uremia in the setting of missed HD with some contribution from COVID-19.    Streptococcal pneumonia bacteremia: Developed low-grade fever/left wrist arthralgia/back pain on 1/10-1/11-blood cultures are positive for Streptococcus pneumonia.  No PNA on CXR.  TTE without vegetations.  MRI with suspicious septic arthritis at L4-L5 level-Per IR-this is not amenable to drainage.  TEE will be set up sometime next week (message already sent to cardiology on 1/13).  Initially on IV Rocephin-has been switched to IV PCN by ID.  Repeat blood cultures on 1/12 negative so far.  Will await further recommendations from ID.     Left wrist arthralgia: Slowly improving after initiation of steroids on 1/10 and IV antibiotics on 1/11.  Although she has a history of lupus with positive ANA-dsDNA level/complement levels are not suggestive of lupus flare.  Suspect this is a reactive arthritis from her underlying infectious issues.  CT of the left wrist did not show any effusion.continue prednisone for the next few days-and taper based on clinical response.  SLE: Claims to have a longstanding history of lupus-followed by rheumatology at [facility].  Per  patient-her Plaquenil was discontinued by her nephrologist  several months ago-I suspect this may be due to prolonged QTC.  See above regarding left wrist arthralgias-Plaquenil remains on hold although QTC is now normalized.  She is currently on steroids-to which her arthralgias seems to be slowly responding to.  Suspect we could slowly titrate down prednisone over the next few days-and keep her on a low-dose prednisone until she sees her primary rheumatologist at [facility] where further decisions regarding long-term DMARDs can be made.  ESRD: Nephrology following-we will defer hemodialysis to nephrology service.  Hypokalemia: Repleted  Chronic systolic heart failure (EF 45-50% on 03/13/2021): Volume status is stable.  Watch closely.  HTN: BP now slowly creeping up-suspect we can still continue to monitor without the use of antihypertensives.  Prolonged QTC: Appears to have develops some amount of QTC elongation over the past few months-repeat EKG on 1/11 and 1/13 with stable/normalized QTC.    We will attempt to keep K> 4, and Mg>2.  Monitor EKGs periodically-continue telemetry monitoring.    Tobacco abuse: Continue transdermal nicotine  Deconditioning/debility: Likely due to COVID-19 infection-and worsening uremia from missed HD.  Hopefully will continue to improve with further hemodialysis-improvement in underlying COVID-19 infection.  Evaluated by PT services-felt to require SNF on discharge.  Social worker following.   BMI Estimated body mass index is 18.5 kg/m as calculated from the following:   Height as of 03/12/21: 5\' 4"  (1.626 m).   Weight as of this encounter: 48.9 kg.    Procedures: None Consults: ID, nephrology. DVT Prophylaxis: SQ heparin Code Status:Full code  Family Communication: None at bedside  Disposition Plan: Status is: Inpatient  Severe debility/deconditioning-now with worsening encephalopathy due to uremia-needs hemodialysis today.  On IV Remdesivir for COVID.  SNF planned on discharge.  Diet: Diet  Order             Diet renal with fluid restriction Fluid restriction: 1200 mL Fluid; Room service appropriate? Yes; Fluid consistency: Thin  Diet effective now                     Antimicrobial agents: Anti-infectives (From admission, onward)    Start     Dose/Rate Route Frequency Ordered Stop   05/22/21 1245  penicillin G potassium 2 Million Units in dextrose 5 % 50 mL IVPB        2 Million Units 100 mL/hr over 30 Minutes Intravenous Every 6 hours 05/22/21 1145     05/21/21 0000  cefTRIAXone (ROCEPHIN) 2 g in sodium chloride 0.9 % 100 mL IVPB  Status:  Discontinued        2 g 200 mL/hr over 30 Minutes Intravenous Daily at bedtime 05/20/21 2305 05/22/21 1145   05/17/21 1000  remdesivir 100 mg in sodium chloride 0.9 % 100 mL IVPB       See Hyperspace for full Linked Orders Report.   100 mg 200 mL/hr over 30 Minutes Intravenous Daily 05/16/21 0847 05/18/21 0857   05/16/21 1000  remdesivir 200 mg in sodium chloride 0.9% 250 mL IVPB       See Hyperspace for full Linked Orders Report.   200 mg 580 mL/hr over 30 Minutes Intravenous Once 05/16/21 0847 05/16/21 1043        MEDICATIONS: Scheduled Meds:  Chlorhexidine Gluconate Cloth  6 each Topical Q0600   cinacalcet  60 mg Oral Once per day on Mon Wed Fri   cinacalcet  60 mg Oral Q T,Th,Sa-HD  famotidine  20 mg Oral Daily   feeding supplement (NEPRO CARB STEADY)  237 mL Oral TID BM   heparin injection (subcutaneous)  5,000 Units Subcutaneous Q8H   multivitamin  1 tablet Oral QHS   nicotine  14 mg Transdermal Daily   predniSONE  40 mg Oral Q breakfast   sevelamer carbonate  1,600 mg Oral TID WC   sodium chloride flush  3 mL Intravenous Q12H   Continuous Infusions:  sodium chloride     pencillin G potassium IV 2 Million Units (05/23/21 1339)   PRN Meds:.sodium chloride, acetaminophen **OR** acetaminophen, LORazepam, oxyCODONE, sodium chloride flush   I have personally reviewed following labs and imaging  studies  LABORATORY DATA: CBC: Recent Labs  Lab 05/19/21 0634 05/20/21 0815 05/21/21 0222 05/22/21 0549 05/23/21 0410  WBC 9.4 13.8* 17.5* 18.5* 12.0*  HGB 11.5* 12.1 10.7* 10.3* 9.9*  HCT 35.1* 38.0 32.6* 29.9* 30.0*  MCV 88.2 88.6 85.1 84.7 87.0  PLT 171 163 179 199 178     Basic Metabolic Panel: Recent Labs  Lab 05/17/21 0313 05/18/21 0239 05/19/21 0634 05/20/21 0142 05/21/21 0222 05/22/21 0549 05/23/21 0410  NA 133*   < > 133* 135 131* 129* 135  K 3.6   < > 3.7 3.9 3.8 4.3 4.3  CL 91*   < > 96* 94* 89* 87* 94*  CO2 14*   < > 19* 26 26 27 26   GLUCOSE 68*   < > 121* 105* 110* 104* 115*  BUN 96*   < > 81* 33* 55* 88* 65*  CREATININE 19.68*   < > 13.00* 7.21* 9.42* 11.24* 7.04*  CALCIUM 7.5*   < > 6.9* 7.2* 7.3* 8.0* 8.8*  MG 2.5*  --   --  2.1 2.4  --  2.5*  PHOS 8.2*   < > 4.0 2.8 4.3 4.1 2.9   < > = values in this interval not displayed.     GFR: Estimated Creatinine Clearance: 6.6 mL/min (A) (by C-G formula based on SCr of 7.04 mg/dL (H)).  Liver Function Tests: Recent Labs  Lab 05/19/21 0634 05/20/21 0142 05/21/21 0222 05/22/21 0549 05/23/21 0410  ALBUMIN 2.6* 2.6* 2.3* 2.2* 2.1*    No results for input(s): LIPASE, AMYLASE in the last 168 hours. No results for input(s): AMMONIA in the last 168 hours.   Coagulation Profile: No results for input(s): INR, PROTIME in the last 168 hours.  Cardiac Enzymes: No results for input(s): CKTOTAL, CKMB, CKMBINDEX, TROPONINI in the last 168 hours.  BNP (last 3 results) No results for input(s): PROBNP in the last 8760 hours.  Lipid Profile: No results for input(s): CHOL, HDL, LDLCALC, TRIG, CHOLHDL, LDLDIRECT in the last 72 hours.  Thyroid Function Tests: No results for input(s): TSH, T4TOTAL, FREET4, T3FREE, THYROIDAB in the last 72 hours.   Anemia Panel: No results for input(s): VITAMINB12, FOLATE, FERRITIN, TIBC, IRON, RETICCTPCT in the last 72 hours.  Urine analysis:    Component Value  Date/Time   COLORURINE YELLOW 05/15/2021 2252   APPEARANCEUR CLEAR 05/15/2021 2252   LABSPEC 1.020 05/15/2021 2252   PHURINE 6.0 05/15/2021 2252   GLUCOSEU NEGATIVE 05/15/2021 2252   HGBUR NEGATIVE 05/15/2021 2252   BILIRUBINUR NEGATIVE 05/15/2021 2252   KETONESUR NEGATIVE 05/15/2021 2252   PROTEINUR NEGATIVE 05/15/2021 2252   NITRITE NEGATIVE 05/15/2021 2252   LEUKOCYTESUR NEGATIVE 05/15/2021 2252    Sepsis Labs: Lactic Acid, Venous    Component Value Date/Time   LATICACIDVEN 1.40 07/11/2017 0545  MICROBIOLOGY: Recent Results (from the past 240 hour(s))  Resp Panel by RT-PCR (Flu A&B, Covid) Nasopharyngeal Swab     Status: Abnormal   Collection Time: 05/15/21  3:26 PM   Specimen: Nasopharyngeal Swab; Nasopharyngeal(NP) swabs in vial transport medium  Result Value Ref Range Status   SARS Coronavirus 2 by RT PCR POSITIVE (A) NEGATIVE Final    Comment: (NOTE) SARS-CoV-2 target nucleic acids are DETECTED.  The SARS-CoV-2 RNA is generally detectable in upper respiratory specimens during the acute phase of infection. Positive results are indicative of the presence of the identified virus, but do not rule out bacterial infection or co-infection with other pathogens not detected by the test. Clinical correlation with patient history and other diagnostic information is necessary to determine patient infection status. The expected result is Negative.  Fact Sheet for Patients: EntrepreneurPulse.com.au  Fact Sheet for Healthcare Providers: IncredibleEmployment.be  This test is not yet approved or cleared by the [place] FDA and  has been authorized for detection and/or diagnosis of SARS-CoV-2 by FDA under an Emergency Use Authorization (EUA).  This EUA will remain in effect (meaning this test can be used) for the duration of  the COVID-19 declaration under Section 564(b)(1) of the A ct, 21 U.S.C. section 360bbb-3(b)(1), unless the  authorization is terminated or revoked sooner.     Influenza A by PCR NEGATIVE NEGATIVE Final   Influenza B by PCR NEGATIVE NEGATIVE Final    Comment: (NOTE) The Xpert Xpress SARS-CoV-2/FLU/RSV plus assay is intended as an aid in the diagnosis of influenza from Nasopharyngeal swab specimens and should not be used as a sole basis for treatment. Nasal washings and aspirates are unacceptable for Xpert Xpress SARS-CoV-2/FLU/RSV testing.  Fact Sheet for Patients: EntrepreneurPulse.com.au  Fact Sheet for Healthcare Providers: IncredibleEmployment.be  This test is not yet approved or cleared by the [place] FDA and has been authorized for detection and/or diagnosis of SARS-CoV-2 by FDA under an Emergency Use Authorization (EUA). This EUA will remain in effect (meaning this test can be used) for the duration of the COVID-19 declaration under Section 564(b)(1) of the Act, 21 U.S.C. section 360bbb-3(b)(1), unless the authorization is terminated or revoked.  Performed at [facility], [place] [address], [place], [place] [address]   Culture, blood (routine x 2)     Status: Abnormal   Collection Time: 05/20/21  8:29 AM   Specimen: BLOOD  Result Value Ref Range Status   Specimen Description BLOOD RIGHT ANTECUBITAL  Final   Special Requests   Final    BOTTLES DRAWN AEROBIC AND ANAEROBIC Blood Culture adequate volume   Culture  Setup Time   Final    GRAM POSITIVE COCCI IN CHAINS IN BOTH AEROBIC AND ANAEROBIC BOTTLES CRITICAL RESULT CALLED TO, READ BACK BY AND VERIFIED WITH: PHARMD G. ABBOTT  05/20/21 @2257  BY JW Performed at [facility], [place] [address] [place], [place] [address]    Culture STREPTOCOCCUS PNEUMONIAE (A)  Final   Report Status 05/22/2021 FINAL  Final   Organism ID, Bacteria STREPTOCOCCUS PNEUMONIAE  Final      Susceptibility   Streptococcus pneumoniae - MIC*    ERYTHROMYCIN <=0.12 SENSITIVE Sensitive     LEVOFLOXACIN 1  SENSITIVE Sensitive     VANCOMYCIN 0.5 SENSITIVE Sensitive     PENO - penicillin <=0.06      PENICILLIN (non-meningitis) <=0.06 SENSITIVE Sensitive     PENICILLIN (oral) <=0.06 SENSITIVE Sensitive     CEFTRIAXONE (non-meningitis) <=0.12 SENSITIVE Sensitive     *  STREPTOCOCCUS PNEUMONIAE  Blood Culture ID Panel (Reflexed)     Status: Abnormal   Collection Time: 05/20/21  8:29 AM  Result Value Ref Range Status   Enterococcus faecalis NOT DETECTED NOT DETECTED Final   Enterococcus Faecium NOT DETECTED NOT DETECTED Final   Listeria monocytogenes NOT DETECTED NOT DETECTED Final   Staphylococcus species NOT DETECTED NOT DETECTED Final   Staphylococcus aureus (BCID) NOT DETECTED NOT DETECTED Final   Staphylococcus epidermidis NOT DETECTED NOT DETECTED Final   Staphylococcus lugdunensis NOT DETECTED NOT DETECTED Final   Streptococcus species DETECTED (A) NOT DETECTED Final    Comment: CRITICAL RESULT CALLED TO, READ BACK BY AND VERIFIED WITH: PHARMD G. ABBOTT  05/20/21 @2257  BY JW    Streptococcus agalactiae NOT DETECTED NOT DETECTED Final   Streptococcus pneumoniae DETECTED (A) NOT DETECTED Final    Comment: CRITICAL RESULT CALLED TO, READ BACK BY AND VERIFIED WITH: PHARMD G. ABBOTT  05/20/21 @2257  BY JW    Streptococcus pyogenes NOT DETECTED NOT DETECTED Final   A.calcoaceticus-baumannii NOT DETECTED NOT DETECTED Final   Bacteroides fragilis NOT DETECTED NOT DETECTED Final   Enterobacterales NOT DETECTED NOT DETECTED Final   Enterobacter cloacae complex NOT DETECTED NOT DETECTED Final   Escherichia coli NOT DETECTED NOT DETECTED Final   Klebsiella aerogenes NOT DETECTED NOT DETECTED Final   Klebsiella oxytoca NOT DETECTED NOT DETECTED Final   Klebsiella pneumoniae NOT DETECTED NOT DETECTED Final   Proteus species NOT DETECTED NOT DETECTED Final   Salmonella species NOT DETECTED NOT DETECTED Final   Serratia marcescens NOT DETECTED NOT DETECTED Final   Haemophilus influenzae NOT DETECTED  NOT DETECTED Final   Neisseria meningitidis NOT DETECTED NOT DETECTED Final   Pseudomonas aeruginosa NOT DETECTED NOT DETECTED Final   Stenotrophomonas maltophilia NOT DETECTED NOT DETECTED Final   Candida albicans NOT DETECTED NOT DETECTED Final   Candida auris NOT DETECTED NOT DETECTED Final   Candida glabrata NOT DETECTED NOT DETECTED Final   Candida krusei NOT DETECTED NOT DETECTED Final   Candida parapsilosis NOT DETECTED NOT DETECTED Final   Candida tropicalis NOT DETECTED NOT DETECTED Final   Cryptococcus neoformans/gattii NOT DETECTED NOT DETECTED Final    Comment: Performed at [facility], [address] [place], [place] [address]  Culture, blood (routine x 2)     Status: Abnormal   Collection Time: 05/20/21  8:42 AM   Specimen: BLOOD RIGHT HAND  Result Value Ref Range Status   Specimen Description BLOOD RIGHT HAND  Final   Special Requests   Final    BOTTLES DRAWN AEROBIC ONLY Blood Culture results may not be optimal due to an inadequate volume of blood received in culture bottles   Culture  Setup Time   Final    GRAM POSITIVE COCCI IN CHAINS AEROBIC BOTTLE ONLY CRITICAL VALUE NOTED.  VALUE IS CONSISTENT WITH PREVIOUSLY REPORTED AND CALLED VALUE.    Culture (A)  Final    STREPTOCOCCUS PNEUMONIAE SUSCEPTIBILITIES PERFORMED ON PREVIOUS CULTURE WITHIN THE LAST 5 DAYS. Performed at [facility], [place] [address] [place], [place] [address]    Report Status 05/22/2021 FINAL  Final  Culture, blood (routine x 2)     Status: None (Preliminary result)   Collection Time: 05/21/21  1:52 PM   Specimen: BLOOD  Result Value Ref Range Status   Specimen Description BLOOD BLOOD LEFT FOREARM  Final   Special Requests   Final    BOTTLES DRAWN AEROBIC AND ANAEROBIC Blood Culture adequate volume  Culture   Final    NO GROWTH < 24 HOURS Performed at [facility], [place] [address] [place], [place] [address]    Report Status PENDING  Incomplete  Culture, blood (routine x  2)     Status: None (Preliminary result)   Collection Time: 05/21/21  1:52 PM   Specimen: BLOOD  Result Value Ref Range Status   Specimen Description BLOOD BLOOD RIGHT HAND  Final   Special Requests   Final    BOTTLES DRAWN AEROBIC AND ANAEROBIC Blood Culture adequate volume   Culture   Final    NO GROWTH < 24 HOURS Performed at [facility], [place] [address] [place], [place] [address]    Report Status PENDING  Incomplete    RADIOLOGY STUDIES/RESULTS: MR CERVICAL SPINE WO CONTRAST  Result Date: 05/22/2021 CLINICAL DATA:  Neck pain, infection suspected, positive xray/CT strep bacteremia; Mid-back pain strep bacteremia-r/o osteo/diskitis; Low back pain, infection suspected, positive xray/CT strep bacteremia-r/o osteo/diskitis EXAM: MRI CERVICAL, THORACIC AND LUMBAR SPINE WITHOUT CONTRAST TECHNIQUE: Multiplanar and multiecho pulse sequences of the cervical spine, to include the craniocervical junction and cervicothoracic junction, and thoracic and lumbar spine, were obtained without intravenous contrast. COMPARISON:  None. FINDINGS: Motion artifact is present, greatest at lumbar levels. MRI CERVICAL SPINE Alignment: Trace anterolisthesis at C3-C4. Vertebrae: Mild multilevel degenerative endplate irregularity. Mild, likely degenerative endplate marrow edema at C3-C4. Cord: No abnormal signal. Posterior Fossa, vertebral arteries, paraspinal tissues: Parenchymal volume loss. 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 Disc levels: C2-C3:  No significant stenosis. C3-C4: Anterolisthesis. Uncovered disc bulge with endplate osteophytes. Mild canal and right foraminal stenosis. No left foraminal stenosis. C4-C5: Disc bulge with small left central disc protrusion. No canal or foraminal stenosis. C5-C6: Minimal disc bulge with endplate osteophytes. No canal or foraminal stenosis. C6-C7: Minimal disc bulge with endplate osteophytes. No canal or foraminal stenosis. C7-T1:  No stenosis. MRI THORACIC  SPINE Alignment:  Preserved. Vertebrae: Mild degenerative endplate irregularity. Schmorl's nodes are present, for example inferior endplate T12 where there is mild associated marrow edema. Cord:  No abnormal signal. Paraspinal and other soft tissues: 1.4 cm nodular lesion of the left lower lobe. Disc levels: Minor degenerative changes are present. There is no canal or foraminal stenosis. MRI LUMBAR SPINE Segmentation:  Standard. Alignment:  Preserved. Vertebrae: Vertebral body heights are maintained. Mild marrow edema associated with L4-L5 facet arthropathy. Conus medullaris and cauda equina: Conus extends to the L1-L2 level. Conus and cauda equina appear normal. Paraspinal and other soft tissues: Unremarkable. Disc levels: No canal or foraminal stenosis at any level. Lower lumbar facet arthropathy is greatest at L4-L5 with there are joint effusions. IMPRESSION: Degraded by motion artifact, particularly at lumbar levels. No evidence of discitis/osteomyelitis. Degenerative changes without high-grade stenosis. Facet arthropathy is greatest at L4-L5 where there are joint effusions; septic arthritis is possible but is not considered likely. 1.4 cm left lower lobe nodular lesion. Electronically Signed   By: Macy Mis M.D.   On: 05/22/2021 13:33   MR THORACIC SPINE WO CONTRAST  Result Date: 05/22/2021 CLINICAL DATA:  Neck pain, infection suspected, positive xray/CT strep bacteremia; Mid-back pain strep bacteremia-r/o osteo/diskitis; Low back pain, infection suspected, positive xray/CT strep bacteremia-r/o osteo/diskitis EXAM: MRI CERVICAL, THORACIC AND LUMBAR SPINE WITHOUT CONTRAST TECHNIQUE: Multiplanar and multiecho pulse sequences of the cervical spine, to include the craniocervical junction and cervicothoracic junction, and thoracic and lumbar spine, were obtained without intravenous contrast. COMPARISON:  None. FINDINGS: Motion artifact is present,  greatest at lumbar levels. MRI CERVICAL SPINE Alignment:  Trace anterolisthesis at C3-C4. Vertebrae: Mild multilevel degenerative endplate irregularity. Mild, likely degenerative endplate marrow edema at C3-C4. Cord: No abnormal signal. Posterior Fossa, vertebral arteries, paraspinal tissues: Parenchymal volume loss. 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 Disc levels: C2-C3:  No significant stenosis. C3-C4: Anterolisthesis. Uncovered disc bulge with endplate osteophytes. Mild canal and right foraminal stenosis. No left foraminal stenosis. C4-C5: Disc bulge with small left central disc protrusion. No canal or foraminal stenosis. C5-C6: Minimal disc bulge with endplate osteophytes. No canal or foraminal stenosis. C6-C7: Minimal disc bulge with endplate osteophytes. No canal or foraminal stenosis. C7-T1:  No stenosis. MRI THORACIC SPINE Alignment:  Preserved. Vertebrae: Mild degenerative endplate irregularity. Schmorl's nodes are present, for example inferior endplate T12 where there is mild associated marrow edema. Cord:  No abnormal signal. Paraspinal and other soft tissues: 1.4 cm nodular lesion of the left lower lobe. Disc levels: Minor degenerative changes are present. There is no canal or foraminal stenosis. MRI LUMBAR SPINE Segmentation:  Standard. Alignment:  Preserved. Vertebrae: Vertebral body heights are maintained. Mild marrow edema associated with L4-L5 facet arthropathy. Conus medullaris and cauda equina: Conus extends to the L1-L2 level. Conus and cauda equina appear normal. Paraspinal and other soft tissues: Unremarkable. Disc levels: No canal or foraminal stenosis at any level. Lower lumbar facet arthropathy is greatest at L4-L5 with there are joint effusions. IMPRESSION: Degraded by motion artifact, particularly at lumbar levels. No evidence of discitis/osteomyelitis. Degenerative changes without high-grade stenosis. Facet arthropathy is greatest at L4-L5 where there are joint effusions; septic arthritis is possible but is not considered likely.  1.4 cm left lower lobe nodular lesion. Electronically Signed   By: Macy Mis M.D.   On: 05/22/2021 13:33   MR LUMBAR SPINE WO CONTRAST  Result Date: 05/22/2021 CLINICAL DATA:  Neck pain, infection suspected, positive xray/CT strep bacteremia; Mid-back pain strep bacteremia-r/o osteo/diskitis; Low back pain, infection suspected, positive xray/CT strep bacteremia-r/o osteo/diskitis EXAM: MRI CERVICAL, THORACIC AND LUMBAR SPINE WITHOUT CONTRAST TECHNIQUE: Multiplanar and multiecho pulse sequences of the cervical spine, to include the craniocervical junction and cervicothoracic junction, and thoracic and lumbar spine, were obtained without intravenous contrast. COMPARISON:  None. FINDINGS: Motion artifact is present, greatest at lumbar levels. MRI CERVICAL SPINE Alignment: Trace anterolisthesis at C3-C4. Vertebrae: Mild multilevel degenerative endplate irregularity. Mild, likely degenerative endplate marrow edema at C3-C4. Cord: No abnormal signal. Posterior Fossa, vertebral arteries, paraspinal tissues: Parenchymal volume loss. 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 Disc levels: C2-C3:  No significant stenosis. C3-C4: Anterolisthesis. Uncovered disc bulge with endplate osteophytes. Mild canal and right foraminal stenosis. No left foraminal stenosis. C4-C5: Disc bulge with small left central disc protrusion. No canal or foraminal stenosis. C5-C6: Minimal disc bulge with endplate osteophytes. No canal or foraminal stenosis. C6-C7: Minimal disc bulge with endplate osteophytes. No canal or foraminal stenosis. C7-T1:  No stenosis. MRI THORACIC SPINE Alignment:  Preserved. Vertebrae: Mild degenerative endplate irregularity. Schmorl's nodes are present, for example inferior endplate T12 where there is mild associated marrow edema. Cord:  No abnormal signal. Paraspinal and other soft tissues: 1.4 cm nodular lesion of the left lower lobe. Disc levels: Minor degenerative changes are present. There is no  canal or foraminal stenosis. MRI LUMBAR SPINE Segmentation:  Standard. Alignment:  Preserved. Vertebrae: Vertebral body heights are maintained. Mild marrow edema associated with L4-L5 facet arthropathy. Conus medullaris and cauda equina: Conus extends to the L1-L2 level. Conus and cauda equina appear  normal. Paraspinal and other soft tissues: Unremarkable. Disc levels: No canal or foraminal stenosis at any level. Lower lumbar facet arthropathy is greatest at L4-L5 with there are joint effusions. IMPRESSION: Degraded by motion artifact, particularly at lumbar levels. No evidence of discitis/osteomyelitis. Degenerative changes without high-grade stenosis. Facet arthropathy is greatest at L4-L5 where there are joint effusions; septic arthritis is possible but is not considered likely. 1.4 cm left lower lobe nodular lesion. Electronically Signed   By: Macy Mis M.D.   On: 05/22/2021 13:33     LOS: 7 days   Oren Binet, MD  [facility]    To contact the attending provider between 7A-7P or the covering provider during after hours 7P-7A, please log into the web site [email or web address] and access using universal [place] password for that web site. If you do not have the password, please call the hospital operator.  05/23/2021, 2:47 PM

## 2021-05-24 LAB — CBC
HCT: 27.7 % — ABNORMAL LOW (ref 36.0–46.0)
Hemoglobin: 8.9 g/dL — ABNORMAL LOW (ref 12.0–15.0)
MCH: 27.8 pg (ref 26.0–34.0)
MCHC: 32.1 g/dL (ref 30.0–36.0)
MCV: 86.6 fL (ref 80.0–100.0)
Platelets: 214 10*3/uL (ref 150–400)
RBC: 3.2 MIL/uL — ABNORMAL LOW (ref 3.87–5.11)
RDW: 19.1 % — ABNORMAL HIGH (ref 11.5–15.5)
WBC: 11.2 10*3/uL — ABNORMAL HIGH (ref 4.0–10.5)
nRBC: 0 % (ref 0.0–0.2)

## 2021-05-24 LAB — RENAL FUNCTION PANEL
Albumin: 1.8 g/dL — ABNORMAL LOW (ref 3.5–5.0)
Anion gap: 11 (ref 5–15)
BUN: 91 mg/dL — ABNORMAL HIGH (ref 6–20)
CO2: 27 mmol/L (ref 22–32)
Calcium: 8.7 mg/dL — ABNORMAL LOW (ref 8.9–10.3)
Chloride: 93 mmol/L — ABNORMAL LOW (ref 98–111)
Creatinine, Ser: 8.6 mg/dL — ABNORMAL HIGH (ref 0.44–1.00)
GFR, Estimated: 5 mL/min — ABNORMAL LOW (ref >60)
Glucose, Bld: 98 mg/dL (ref 70–99)
Phosphorus: 2.3 mg/dL — ABNORMAL LOW (ref 2.5–4.6)
Potassium: 4.6 mmol/L (ref 3.5–5.1)
Sodium: 131 mmol/L — ABNORMAL LOW (ref 135–145)

## 2021-05-24 LAB — PROCALCITONIN: Procalcitonin: 6.99 ng/mL

## 2021-05-24 LAB — SEDIMENTATION RATE: Sed Rate: 86 mm/hr — ABNORMAL HIGH (ref 0–22)

## 2021-05-24 LAB — MAGNESIUM: Magnesium: 2.8 mg/dL — ABNORMAL HIGH (ref 1.7–2.4)

## 2021-05-24 MED ORDER — PREDNISONE 20 MG PO TABS
20.0000 mg | ORAL_TABLET | Freq: Every day | ORAL | Status: DC
  Administered 2021-05-25: 20 mg via ORAL
  Filled 2021-05-24: qty 1

## 2021-05-24 MED ORDER — DOXERCALCIFEROL 4 MCG/2ML IV SOLN
6.0000 ug | INTRAVENOUS | Status: DC
  Administered 2021-05-26 – 2021-06-04 (×4): 6 ug via INTRAVENOUS
  Filled 2021-05-24 (×9): qty 4

## 2021-05-24 MED ORDER — NALOXONE HCL 0.4 MG/ML IJ SOLN: 0.4000 mg | INTRAMUSCULAR | Status: DC | PRN

## 2021-05-24 MED ORDER — OXYCODONE HCL 5 MG PO TABS
10.0000 mg | ORAL_TABLET | ORAL | Status: DC | PRN
  Administered 2021-05-24: 11:00:00 5 mg via ORAL
  Administered 2021-05-25 – 2021-06-03 (×19): 10 mg via ORAL
  Filled 2021-05-24 (×20): qty 2

## 2021-05-24 NOTE — Progress Notes (Addendum)
[name] Progress Note   Subjective: Patient seen in room, eating lunch, no c/o's , occ cough  Objective Vitals:   05/24/21 0730 05/24/21 0800 05/24/21 0830 05/24/21 0938  BP: (!) 147/80 (!) 146/82 (!) 150/74 (!) 154/91  Pulse: 82 88 78 82  Resp: 19 20 20 18   Temp:   97.8 F (36.6 C) (!) 97.5 F (36.4 C)  TempSrc:   Oral Oral  SpO2:    100%  Weight:   48 kg    Physical Exam   alert, nad , some cough  no jvd  Chest cta bilat  Cor reg no RG  Abd soft ntnd no ascites   Ext no LE edema   Alert, NF, ox3   LUE AVG+bruit   Background:  Kathleen [name] is a 61 y.o. female with a history of ESRD on HD, COPD, HTN, migraine, lupus, and hx of seizures who presented to the hospital with weakness. She states that she was too weak to go to her last dialysis treatment and the ER has observed that she's too weak to get off of the bed.  Nephrology consulted for assistance with management of HD.+ COVID 19.   OP HD: [name]   3h 36min  50.8kg 350/1.5  2/2 bath F160  LUE AVG    - last post weight 1/3 was 48.2 kg  - sensipar 60 mg three times a week  - hectorol 6 mcg IV tiw  - venofer 100 mg weekly  - mircera 75 mcg q 2wks last 12/20, held 1/3 due to [facility]     CXR 1/06 - IMPRESSION: No active cardiopulmonary disease.   Assessment/Plan: Generalized weakness - felt secondary to covid, +esrd, multifactorial.  Covid PNA+ - per pmd Strep pneumoniae bacteremia - Seen by ID, getting IV PCN.  ESRD - HD on TTS schedule. HD later tonight.   BP/ volume - 2 kg under dry wt, losing body wt. Keep even today GI bleed - per primary team. Normal admit Hb > 10. Follow.   Anemia ckd - Hb up on admit, now down to 9 range. Consider esa this week.  MBD ckd - continue hectorol and sensipar tiw. Cont renvela w/ meals.  SLE per primary Disposition - per Pamelia Hoit, MD 05/23/2021, 2:36 PM      Additional Objective Labs: Basic Metabolic Panel: Recent Labs  Lab 05/22/21 0549  05/23/21 0410 05/24/21 0119  NA 129* 135 131*  K 4.3 4.3 4.6  CL 87* 94* 93*  CO2 27 26 27   GLUCOSE 104* 115* 98  BUN 88* 65* 91*  CREATININE 11.24* 7.04* 8.60*  CALCIUM 8.0* 8.8* 8.7*  PHOS 4.1 2.9 2.3*    Liver Function Tests: Recent Labs  Lab 05/22/21 0549 05/23/21 0410 05/24/21 0119  ALBUMIN 2.2* 2.1* 1.8*    No results for input(s): LIPASE, AMYLASE in the last 168 hours. CBC: Recent Labs  Lab 05/20/21 0815 05/21/21 0222 05/22/21 0549 05/23/21 0410 05/24/21 0119  WBC 13.8* 17.5* 18.5* 12.0* 11.2*  HGB 12.1 10.7* 10.3* 9.9* 8.9*  HCT 38.0 32.6* 29.9* 30.0* 27.7*  MCV 88.6 85.1 84.7 87.0 86.6  PLT 163 179 199 178 214    Blood Culture    Component Value Date/Time   SDES BLOOD BLOOD LEFT FOREARM 05/21/2021 1352   SDES BLOOD BLOOD RIGHT HAND 05/21/2021 1352   SPECREQUEST  05/21/2021 1352    BOTTLES DRAWN AEROBIC AND ANAEROBIC Blood Culture adequate volume   SPECREQUEST  05/21/2021 1352  BOTTLES DRAWN AEROBIC AND ANAEROBIC Blood Culture adequate volume   CULT  05/21/2021 1352    NO GROWTH 2 DAYS Performed at [facility], [place] [address], [place], [place] [address]    CULT  05/21/2021 1352    NO GROWTH 2 DAYS Performed at [facility], [place] [address], [place], [place] [address]    REPTSTATUS PENDING 05/21/2021 1352   REPTSTATUS PENDING 05/21/2021 1352    Cardiac Enzymes: No results for input(s): CKTOTAL, CKMB, CKMBINDEX, TROPONINI in the last 168 hours. CBG: Recent Labs  Lab 05/19/21 0654  GLUCAP 141*   Medications:  sodium chloride     pencillin G potassium IV 2 Million Units (05/24/21 1221)    Chlorhexidine Gluconate Cloth  6 each Topical Q0600   cinacalcet  60 mg Oral Once per day on Mon Wed Fri   cinacalcet  60 mg Oral Q T,Th,Sa-HD   famotidine  20 mg Oral Daily   feeding supplement (NEPRO CARB STEADY)  237 mL Oral TID BM   heparin injection (subcutaneous)  5,000 Units Subcutaneous Q8H   multivitamin  1 tablet Oral QHS    nicotine  14 mg Transdermal Daily   [START ON 05/25/2021] predniSONE  20 mg Oral Q breakfast   sevelamer carbonate  1,600 mg Oral TID WC   sodium chloride flush  3 mL Intravenous Q12H

## 2021-05-24 NOTE — Progress Notes (Addendum)
PROGRESS NOTE        PATIENT DETAILS Name: [name] Age: 61 y.o. Sex: female Date of Birth: 10-09-1960 Admit Date: 05/15/2021 Admitting Physician Orma Flaming, MD FMB:WGYKZ, Beverly Gust, NP  Brief Narrative:  Patient is a 61 y.o. female with history of ESRD on HD TTS, HFrEF, SLE, anemia due to CKD, HTN-who presented with at least 1 week history of worsening weakness/lethargy, intermittent diarrhea, hematochezia-she had missed her last 3 HD sessions prior to hospitalization due to weakness/lethargy.  She was found to have COVID-19 infection-and thought to have uremic encephalopathy in the setting of missed HD.  She stabilized with Remdesivir and hemodialysis.  Unfortunately-Hospital course was complicated by severe arthralgias mostly in her left wrist/spine-low-grade fever-she was subsequently found to have streptococcal pneumonia bacteremia.  See below for further details.  Significant Hospital events 1/06>> admit with diarrhea/intermittent hematochezia from COVID-uremic encephalopathy from missed HD. 1/10>> developed arthralgias-mostly in her left wrist.Prednisone ordered 1/11>> low-grade fever-worsening left wrist arthralgia-CT left wrist negative for effusion.   1/11>> blood culture positive Streptococcus pneumoniae-started on IV Rocephin.  Subjective: Patient in bed denies any headache chest or abdominal pain, has pain all over her back but states this has been going on for several weeks, no shortness of breath or chest pain.  Just finished her HD.  Objective: Vitals: Blood pressure (!) 150/74, pulse 78, temperature 97.8 F (36.6 C), temperature source Oral, resp. rate 20, weight 48 kg, SpO2 100 %.   Exam:  Awake Alert, No new F.N deficits, Normal affect [place].AT,PERRAL Supple Neck, No JVD,   Symmetrical Chest wall movement, Good air movement bilaterally, CTAB RRR,No Gallops, Rubs or new Murmurs,  +ve B.Sounds, Abd Soft, No tenderness,   No  Cyanosis, Clubbing or edema    Pertinent Labs/Radiology: Recent Labs  Lab 05/24/21 0119  WBC 11.2*  HGB 8.9*  PLT 214  NA 131*  K 4.6  CREATININE 8.60*   Assessment/Plan: COVID-19 infection: Improved-diarrhea has resolved-no PNA on CXR-completed 3 days of Remdesivir   Diarrhea with intermittent hematochezia: Diarrhea due to COVID-19 infection-suspect hematochezia was in the setting of some mucosal sloughing.  Hematochezia and diarrhea have resolved.  Given significant debility/deconditioning/stable hemoglobin-suspect we can postpone endoscopic evaluation to the outpatient setting.  Discussed this with patient-she agrees.    Acute metabolic encephalopathy: Resolved.  Due to uremia in the setting of missed HD with some contribution from COVID-19.    Streptococcal pneumonia bacteremia: Developed low-grade fever/left wrist arthralgia/back pain on 1/10-1/11-blood cultures are positive for Streptococcus pneumonia.  No PNA on CXR.  TTE without vegetations.  MRI with suspicious septic arthritis at L4-L5 level-Per IR-this is not amenable to drainage.  TEE is scheduled for 05/25/2021.  Currently on IV penicillin per ID.  Repeat blood cultures drawn on 05/21/2021 are negative so far.  Case discussed with neurosurgeon Dr. Kathyrn Sheriff including the MRI findings, does not think these findings are of any significance.  No further work-up for this issue of nonspecific MRI changes  Left wrist arthralgia: Slowly improving after initiation of steroids on 1/10 and IV antibiotics on 1/11.  Although she has a history of lupus with positive ANA-dsDNA level/complement levels are not suggestive of lupus flare.  Suspect this is a reactive arthritis from her underlying infectious issues.  CT of the left wrist did not show any effusion.continue prednisone for the next few days-and taper  based on clinical response.  SLE: Claims to have a longstanding history of lupus-followed by rheumatology at [facility].   Per patient-her Plaquenil was discontinued by her nephrologist several months ago-I currently on oral prednisone continue at 20 mg dose with outpatient follow-up with her rheumatologist at [facility] post discharge.  ESRD: Nephrology following-we will defer hemodialysis to nephrology service.  Hypokalemia: Repleted  Chronic systolic heart failure (EF 45-50% on 03/13/2021): Volume status is stable.  Watch closely.  HTN: BP now slowly creeping up-suspect we can still continue to monitor without the use of antihypertensives.  Prolonged QTC: Appears to have develops some amount of QTC elongation over the past few months-repeat EKG on 1/11 and 1/13 with stable/normalized QTC.    We will attempt to keep K> 4, and Mg>2.  Monitor EKGs periodically-continue telemetry monitoring.    Tobacco abuse: Continue transdermal nicotine  Deconditioning/debility: Likely due to COVID-19 infection-and worsening uremia from missed HD.  Hopefully will continue to improve with further hemodialysis-improvement in underlying COVID-19 infection.  Evaluated by PT services-felt to require SNF on discharge.  Social worker following.   BMI Estimated body mass index is 18.16 kg/m as calculated from the following:   Height as of 03/12/21: 5\' 4"  (1.626 m).   Weight as of this encounter: 48 kg.    Procedures: None Consults: ID, nephrology.  Neurosurgeon Dr. Kathyrn Sheriff on 05/24/2021 over the phone. DVT Prophylaxis: SQ heparin Code Status:Full code  Family Communication: None at bedside  Disposition Plan: Status is: Inpatient  Severe debility/deconditioning-now with worsening encephalopathy due to uremia-needs hemodialysis today.  On IV Remdesivir for COVID.  SNF planned on discharge.  Diet: Diet Order             Diet NPO time specified Except for: Sips with Meds  Diet effective midnight           Diet renal with fluid restriction Fluid restriction: 1200 mL Fluid; Room service appropriate? Yes; Fluid consistency:  Thin  Diet effective now                    MEDICATIONS: Scheduled Meds:  Chlorhexidine Gluconate Cloth  6 each Topical Q0600   cinacalcet  60 mg Oral Once per day on Mon Wed Fri   cinacalcet  60 mg Oral Q T,Th,Sa-HD   famotidine  20 mg Oral Daily   feeding supplement (NEPRO CARB STEADY)  237 mL Oral TID BM   heparin injection (subcutaneous)  5,000 Units Subcutaneous Q8H   multivitamin  1 tablet Oral QHS   nicotine  14 mg Transdermal Daily   [START ON 05/25/2021] predniSONE  20 mg Oral Q breakfast   sevelamer carbonate  1,600 mg Oral TID WC   sodium chloride flush  3 mL Intravenous Q12H   Continuous Infusions:  sodium chloride     pencillin G potassium IV 2 Million Units (05/24/21 0905)   PRN Meds:.sodium chloride, acetaminophen **OR** acetaminophen, LORazepam, naLOXone (NARCAN)  injection, oxyCODONE, sodium chloride flush   I have personally reviewed following labs and imaging studies  LABORATORY DATA:  Recent Labs  Lab 05/20/21 0815 05/21/21 0222 05/22/21 0549 05/23/21 0410 05/24/21 0119  WBC 13.8* 17.5* 18.5* 12.0* 11.2*  HGB 12.1 10.7* 10.3* 9.9* 8.9*  HCT 38.0 32.6* 29.9* 30.0* 27.7*  PLT 163 179 199 178 214  MCV 88.6 85.1 84.7 87.0 86.6  MCH 28.2 27.9 29.2 28.7 27.8  MCHC 31.8 32.8 34.4 33.0 32.1  RDW 18.3* 18.5* 18.8* 19.3* 19.1*  Recent Labs  Lab 05/20/21 0142 05/21/21 0222 05/22/21 0549 05/23/21 0410 05/24/21 0119  NA 135 131* 129* 135 131*  K 3.9 3.8 4.3 4.3 4.6  CL 94* 89* 87* 94* 93*  CO2 26 26 27 26 27   GLUCOSE 105* 110* 104* 115* 98  BUN 33* 55* 88* 65* 91*  CREATININE 7.21* 9.42* 11.24* 7.04* 8.60*  CALCIUM 7.2* 7.3* 8.0* 8.8* 8.7*  ALBUMIN 2.6* 2.3* 2.2* 2.1* 1.8*  MG 2.1 2.4  --  2.5* 2.8*    RADIOLOGY STUDIES/RESULTS: MR CERVICAL SPINE WO CONTRAST  Result Date: 05/22/2021 CLINICAL DATA:  Neck pain, infection suspected, positive xray/CT strep bacteremia; Mid-back pain strep bacteremia-r/o osteo/diskitis; Low back pain,  infection suspected, positive xray/CT strep bacteremia-r/o osteo/diskitis EXAM: MRI CERVICAL, THORACIC AND LUMBAR SPINE WITHOUT CONTRAST TECHNIQUE: Multiplanar and multiecho pulse sequences of the cervical spine, to include the craniocervical junction and cervicothoracic junction, and thoracic and lumbar spine, were obtained without intravenous contrast. COMPARISON:  None. FINDINGS: Motion artifact is present, greatest at lumbar levels. MRI CERVICAL SPINE Alignment: Trace anterolisthesis at C3-C4. Vertebrae: Mild multilevel degenerative endplate irregularity. Mild, likely degenerative endplate marrow edema at C3-C4. Cord: No abnormal signal. Posterior Fossa, vertebral arteries, paraspinal tissues: Parenchymal volume loss. 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 Disc levels: C2-C3:  No significant stenosis. C3-C4: Anterolisthesis. Uncovered disc bulge with endplate osteophytes. Mild canal and right foraminal stenosis. No left foraminal stenosis. C4-C5: Disc bulge with small left central disc protrusion. No canal or foraminal stenosis. C5-C6: Minimal disc bulge with endplate osteophytes. No canal or foraminal stenosis. C6-C7: Minimal disc bulge with endplate osteophytes. No canal or foraminal stenosis. C7-T1:  No stenosis. MRI THORACIC SPINE Alignment:  Preserved. Vertebrae: Mild degenerative endplate irregularity. Schmorl's nodes are present, for example inferior endplate T12 where there is mild associated marrow edema. Cord:  No abnormal signal. Paraspinal and other soft tissues: 1.4 cm nodular lesion of the left lower lobe. Disc levels: Minor degenerative changes are present. There is no canal or foraminal stenosis. MRI LUMBAR SPINE Segmentation:  Standard. Alignment:  Preserved. Vertebrae: Vertebral body heights are maintained. Mild marrow edema associated with L4-L5 facet arthropathy. Conus medullaris and cauda equina: Conus extends to the L1-L2 level. Conus and cauda equina appear normal. Paraspinal  and other soft tissues: Unremarkable. Disc levels: No canal or foraminal stenosis at any level. Lower lumbar facet arthropathy is greatest at L4-L5 with there are joint effusions. IMPRESSION: Degraded by motion artifact, particularly at lumbar levels. No evidence of discitis/osteomyelitis. Degenerative changes without high-grade stenosis. Facet arthropathy is greatest at L4-L5 where there are joint effusions; septic arthritis is possible but is not considered likely. 1.4 cm left lower lobe nodular lesion. Electronically Signed   By: Macy Mis M.D.   On: 05/22/2021 13:33   MR THORACIC SPINE WO CONTRAST  Result Date: 05/22/2021 CLINICAL DATA:  Neck pain, infection suspected, positive xray/CT strep bacteremia; Mid-back pain strep bacteremia-r/o osteo/diskitis; Low back pain, infection suspected, positive xray/CT strep bacteremia-r/o osteo/diskitis EXAM: MRI CERVICAL, THORACIC AND LUMBAR SPINE WITHOUT CONTRAST TECHNIQUE: Multiplanar and multiecho pulse sequences of the cervical spine, to include the craniocervical junction and cervicothoracic junction, and thoracic and lumbar spine, were obtained without intravenous contrast. COMPARISON:  None. FINDINGS: Motion artifact is present, greatest at lumbar levels. MRI CERVICAL SPINE Alignment: Trace anterolisthesis at C3-C4. Vertebrae: Mild multilevel degenerative endplate irregularity. Mild, likely degenerative endplate marrow edema at C3-C4. Cord: No abnormal signal. Posterior Fossa, vertebral arteries, paraspinal tissues: Parenchymal volume loss. Probable small chronic infarct  of the central pons. Disc levels: C2-C3:  No significant stenosis. C3-C4: Anterolisthesis. Uncovered disc bulge with endplate osteophytes. Mild canal and right foraminal stenosis. No left foraminal stenosis. C4-C5: Disc bulge with small left central disc protrusion. No canal or foraminal stenosis. C5-C6: Minimal disc bulge with endplate osteophytes. No canal or foraminal stenosis. C6-C7:  Minimal disc bulge with endplate osteophytes. No canal or foraminal stenosis. C7-T1:  No stenosis. MRI THORACIC SPINE Alignment:  Preserved. Vertebrae: Mild degenerative endplate irregularity. Schmorl's nodes are present, for example inferior endplate T12 where there is mild associated marrow edema. Cord:  No abnormal signal. Paraspinal and other soft tissues: 1.4 cm nodular lesion of the left lower lobe. Disc levels: Minor degenerative changes are present. There is no canal or foraminal stenosis. MRI LUMBAR SPINE Segmentation:  Standard. Alignment:  Preserved. Vertebrae: Vertebral body heights are maintained. Mild marrow edema associated with L4-L5 facet arthropathy. Conus medullaris and cauda equina: Conus extends to the L1-L2 level. Conus and cauda equina appear normal. Paraspinal and other soft tissues: Unremarkable. Disc levels: No canal or foraminal stenosis at any level. Lower lumbar facet arthropathy is greatest at L4-L5 with there are joint effusions. IMPRESSION: Degraded by motion artifact, particularly at lumbar levels. No evidence of discitis/osteomyelitis. Degenerative changes without high-grade stenosis. Facet arthropathy is greatest at L4-L5 where there are joint effusions; septic arthritis is possible but is not considered likely. 1.4 cm left lower lobe nodular lesion. Electronically Signed   By: Macy Mis M.D.   On: 05/22/2021 13:33   MR LUMBAR SPINE WO CONTRAST  Result Date: 05/22/2021 CLINICAL DATA:  Neck pain, infection suspected, positive xray/CT strep bacteremia; Mid-back pain strep bacteremia-r/o osteo/diskitis; Low back pain, infection suspected, positive xray/CT strep bacteremia-r/o osteo/diskitis EXAM: MRI CERVICAL, THORACIC AND LUMBAR SPINE WITHOUT CONTRAST TECHNIQUE: Multiplanar and multiecho pulse sequences of the cervical spine, to include the craniocervical junction and cervicothoracic junction, and thoracic and lumbar spine, were obtained without intravenous contrast.  COMPARISON:  None. FINDINGS: Motion artifact is present, greatest at lumbar levels. MRI CERVICAL SPINE Alignment: Trace anterolisthesis at C3-C4. Vertebrae: Mild multilevel degenerative endplate irregularity. Mild, likely degenerative endplate marrow edema at C3-C4. Cord: No abnormal signal. Posterior Fossa, vertebral arteries, paraspinal tissues: Parenchymal volume loss. 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 Disc levels: C2-C3:  No significant stenosis. C3-C4: Anterolisthesis. Uncovered disc bulge with endplate osteophytes. Mild canal and right foraminal stenosis. No left foraminal stenosis. C4-C5: Disc bulge with small left central disc protrusion. No canal or foraminal stenosis. C5-C6: Minimal disc bulge with endplate osteophytes. No canal or foraminal stenosis. C6-C7: Minimal disc bulge with endplate osteophytes. No canal or foraminal stenosis. C7-T1:  No stenosis. MRI THORACIC SPINE Alignment:  Preserved. Vertebrae: Mild degenerative endplate irregularity. Schmorl's nodes are present, for example inferior endplate T12 where there is mild associated marrow edema. Cord:  No abnormal signal. Paraspinal and other soft tissues: 1.4 cm nodular lesion of the left lower lobe. Disc levels: Minor degenerative changes are present. There is no canal or foraminal stenosis. MRI LUMBAR SPINE Segmentation:  Standard. Alignment:  Preserved. Vertebrae: Vertebral body heights are maintained. Mild marrow edema associated with L4-L5 facet arthropathy. Conus medullaris and cauda equina: Conus extends to the L1-L2 level. Conus and cauda equina appear normal. Paraspinal and other soft tissues: Unremarkable. Disc levels: No canal or foraminal stenosis at any level. Lower lumbar facet arthropathy is greatest at L4-L5 with there are joint effusions. IMPRESSION: Degraded by motion artifact, particularly at lumbar levels. No evidence of discitis/osteomyelitis. Degenerative  changes without high-grade stenosis. Facet  arthropathy is greatest at L4-L5 where there are joint effusions; septic arthritis is possible but is not considered likely. 1.4 cm left lower lobe nodular lesion. Electronically Signed   By: Macy Mis M.D.   On: 05/22/2021 13:33     LOS: 8 days   Signature  Lala Lund M.D on 05/24/2021 at 10:05 AM   -  To page go to [email or web address]

## 2021-05-24 NOTE — Progress Notes (Addendum)
[facility] Progress Note   Subjective: Patient not seen directly today given COVID-19 + status, utilizing data taken from chart +/- discussions w/ providers and staff.    Objective Vitals:   05/24/21 0730 05/24/21 0800 05/24/21 0830 05/24/21 0938  BP: (!) 147/80 (!) 146/82 (!) 150/74 (!) 154/91  Pulse: 82 88 78 82  Resp: 19 20 20 18   Temp:   97.8 F (36.6 C) (!) 97.5 F (36.4 C)  TempSrc:   Oral Oral  SpO2:    100%  Weight:   48 kg    Physical Exam Patient not seen directly today given COVID-19 + status, utilizing data taken from chart +/- discussions w/ providers and staff.     Background:  Kathleen [name] is a 61 y.o. female with a history of ESRD on HD, COPD, HTN, migraine, lupus, and hx of seizures who presented to the hospital with weakness. She states that she was too weak to go to her last dialysis treatment and the ER has observed that she's too weak to get off of the bed.  Nephrology consulted for assistance with management of HD.+ COVID 19.   [name]   3h 10min  50.8kg 350/1.5  2/2 bath F160  LUE AVG    - last post weight 1/3 was 48.2 kg  - sensipar 60 mg three times a week  - hectorol 6 mcg IV tiw  - venofer 100 mg weekly  - mircera 75 mcg q 2wks last 12/20, held 1/3 due to [facility]     CXR 1/06 - IMPRESSION: No active cardiopulmonary disease.   Assessment/Plan: Gen'd weakness - felt secondary to covid, +esrd, multifactorial. Better.  Covid PNA+ - sp 3d remdesivir Strep pneumoniae bacteremia - Seen by ID, getting IV PCN. TEE for 05/25/21.  ESRD - HD on TTS schedule. Had overnight. Next HD 05/26/21.  BP/ volume - 2 kg under dry wt, losing body wt. Euvolemic on exam.  GI bleed - per primary team  Anemia ckd - Hb up on admit, now down to 9 range. Consider esa this week.  MBD ckd - continue hectorol and sensipar tiw. Cont renvela w/ meals.  SLE per primary   [name], MD 05/23/2021, 2:43 PM      Additional Objective Labs: Basic  Metabolic Panel: Recent Labs  Lab 05/22/21 0549 05/23/21 0410 05/24/21 0119  NA 129* 135 131*  K 4.3 4.3 4.6  CL 87* 94* 93*  CO2 27 26 27   GLUCOSE 104* 115* 98  BUN 88* 65* 91*  CREATININE 11.24* 7.04* 8.60*  CALCIUM 8.0* 8.8* 8.7*  PHOS 4.1 2.9 2.3*    Liver Function Tests: Recent Labs  Lab 05/22/21 0549 05/23/21 0410 05/24/21 0119  ALBUMIN 2.2* 2.1* 1.8*    No results for input(s): LIPASE, AMYLASE in the last 168 hours. CBC: Recent Labs  Lab 05/20/21 0815 05/21/21 0222 05/22/21 0549 05/23/21 0410 05/24/21 0119  WBC 13.8* 17.5* 18.5* 12.0* 11.2*  HGB 12.1 10.7* 10.3* 9.9* 8.9*  HCT 38.0 32.6* 29.9* 30.0* 27.7*  MCV 88.6 85.1 84.7 87.0 86.6  PLT 163 179 199 178 214    Blood Culture    Component Value Date/Time   SDES BLOOD BLOOD LEFT FOREARM 05/21/2021 1352   SDES BLOOD BLOOD RIGHT HAND 05/21/2021 1352   SPECREQUEST  05/21/2021 1352    BOTTLES DRAWN AEROBIC AND ANAEROBIC Blood Culture adequate volume   SPECREQUEST  05/21/2021 1352    BOTTLES DRAWN AEROBIC AND ANAEROBIC Blood Culture  adequate volume   CULT  05/21/2021 1352    NO GROWTH 2 DAYS Performed at [facility], [place] [address] [place], [place] [address]    CULT  05/21/2021 1352    NO GROWTH 2 DAYS Performed at [facility], [place] [address], [place], [place] [address]    REPTSTATUS PENDING 05/21/2021 1352   REPTSTATUS PENDING 05/21/2021 1352    Cardiac Enzymes: No results for input(s): CKTOTAL, CKMB, CKMBINDEX, TROPONINI in the last 168 hours. CBG: Recent Labs  Lab 05/19/21 0654  GLUCAP 141*   Medications:  sodium chloride     pencillin G potassium IV 2 Million Units (05/24/21 1221)    Chlorhexidine Gluconate Cloth  6 each Topical Q0600   cinacalcet  60 mg Oral Q T,Th,Sa-HD   [START ON 05/26/2021] doxercalciferol  6 mcg Intravenous Q T,Th,Sa-HD   famotidine  20 mg Oral Daily   feeding supplement (NEPRO CARB STEADY)  237 mL Oral TID BM   heparin injection (subcutaneous)   5,000 Units Subcutaneous Q8H   multivitamin  1 tablet Oral QHS   nicotine  14 mg Transdermal Daily   [START ON 05/25/2021] predniSONE  20 mg Oral Q breakfast   sevelamer carbonate  1,600 mg Oral TID WC   sodium chloride flush  3 mL Intravenous Q12H

## 2021-05-24 NOTE — Progress Notes (Addendum)
° ° °  [facility] has been requested to perform a transesophageal echocardiogram on this patient for bacteremia.  After careful review of history and examination, the risks and benefits of transesophageal echocardiogram have been explained including risks of esophageal damage, perforation (1:10,000 risk), bleeding, pharyngeal hematoma as well as other potential complications associated with anesthesia including aspiration, arrhythmia, respiratory failure and death. Alternatives to treatment were discussed, questions were answered. Patient is willing to proceed. Patient A+Ox3 and asked good questions. This is scheduled with Dr. Audie Box tomorrow at 2pm. Orders written.  Charlie Pitter, PA-C 05/24/2021 8:33 AM

## 2021-05-25 ENCOUNTER — Inpatient Hospital Stay (HOSPITAL_COMMUNITY): Payer: Medicare Other | Admitting: Anesthesiology

## 2021-05-25 ENCOUNTER — Encounter (HOSPITAL_COMMUNITY)
Admission: EM | Disposition: A | Discharge status: Home / Self Care | Payer: Self-pay | Source: Home / Self Care | Attending: Internal Medicine

## 2021-05-25 ENCOUNTER — Encounter (HOSPITAL_COMMUNITY): Payer: Self-pay | Admitting: Family Medicine

## 2021-05-25 ENCOUNTER — Inpatient Hospital Stay (HOSPITAL_COMMUNITY): Payer: Medicare Other

## 2021-05-25 ENCOUNTER — Other Ambulatory Visit: Payer: Self-pay

## 2021-05-25 DIAGNOSIS — R7881 Bacteremia: Secondary | ICD-10-CM | POA: Diagnosis not present

## 2021-05-25 DIAGNOSIS — I351 Nonrheumatic aortic (valve) insufficiency: Secondary | ICD-10-CM

## 2021-05-25 HISTORY — PX: TEE WITHOUT CARDIOVERSION: SHX5443

## 2021-05-25 HISTORY — PX: BUBBLE STUDY: SHX6837

## 2021-05-25 LAB — COMPREHENSIVE METABOLIC PANEL
ALT: 12 U/L (ref 0–44)
AST: 18 U/L (ref 15–41)
Albumin: 1.9 g/dL — ABNORMAL LOW (ref 3.5–5.0)
Alkaline Phosphatase: 70 U/L (ref 38–126)
Anion gap: 8 (ref 5–15)
BUN: 51 mg/dL — ABNORMAL HIGH (ref 6–20)
CO2: 30 mmol/L (ref 22–32)
Calcium: 9 mg/dL (ref 8.9–10.3)
Chloride: 95 mmol/L — ABNORMAL LOW (ref 98–111)
Creatinine, Ser: 5.73 mg/dL — ABNORMAL HIGH (ref 0.44–1.00)
GFR, Estimated: 8 mL/min — ABNORMAL LOW (ref >60)
Glucose, Bld: 100 mg/dL — ABNORMAL HIGH (ref 70–99)
Potassium: 4.3 mmol/L (ref 3.5–5.1)
Sodium: 133 mmol/L — ABNORMAL LOW (ref 135–145)
Total Bilirubin: 0.4 mg/dL (ref 0.3–1.2)
Total Protein: 6.2 g/dL — ABNORMAL LOW (ref 6.5–8.1)

## 2021-05-25 LAB — CBC WITH DIFFERENTIAL/PLATELET
Abs Immature Granulocytes: 0.11 10*3/uL — ABNORMAL HIGH (ref 0.00–0.07)
Basophils Absolute: 0 10*3/uL (ref 0.0–0.1)
Basophils Relative: 0 %
Eosinophils Absolute: 0 10*3/uL (ref 0.0–0.5)
Eosinophils Relative: 0 %
HCT: 31.7 % — ABNORMAL LOW (ref 36.0–46.0)
Hemoglobin: 10 g/dL — ABNORMAL LOW (ref 12.0–15.0)
Immature Granulocytes: 1 %
Lymphocytes Relative: 10 %
Lymphs Abs: 1.2 10*3/uL (ref 0.7–4.0)
MCH: 28.2 pg (ref 26.0–34.0)
MCHC: 31.5 g/dL (ref 30.0–36.0)
MCV: 89.5 fL (ref 80.0–100.0)
Monocytes Absolute: 1 10*3/uL (ref 0.1–1.0)
Monocytes Relative: 8 %
Neutro Abs: 10.1 10*3/uL — ABNORMAL HIGH (ref 1.7–7.7)
Neutrophils Relative %: 81 %
Platelets: 235 10*3/uL (ref 150–400)
RBC: 3.54 MIL/uL — ABNORMAL LOW (ref 3.87–5.11)
RDW: 19.3 % — ABNORMAL HIGH (ref 11.5–15.5)
WBC: 12.5 10*3/uL — ABNORMAL HIGH (ref 4.0–10.5)
nRBC: 0 % (ref 0.0–0.2)

## 2021-05-25 LAB — MAGNESIUM: Magnesium: 2.5 mg/dL — ABNORMAL HIGH (ref 1.7–2.4)

## 2021-05-25 LAB — PROCALCITONIN: Procalcitonin: 4.2 ng/mL

## 2021-05-25 LAB — C-REACTIVE PROTEIN: CRP: 7.1 mg/dL — ABNORMAL HIGH (ref <1.0)

## 2021-05-25 SURGERY — ECHOCARDIOGRAM, TRANSESOPHAGEAL
Anesthesia: Monitor Anesthesia Care

## 2021-05-25 MED ORDER — PROPOFOL 10 MG/ML IV BOLUS
INTRAVENOUS | Status: DC | PRN
Start: 2021-05-25 — End: 2021-05-25
  Administered 2021-05-25 (×2): 20 mg via INTRAVENOUS

## 2021-05-25 MED ORDER — PREDNISONE 20 MG PO TABS
40.0000 mg | ORAL_TABLET | Freq: Every day | ORAL | Status: DC
  Administered 2021-05-26 – 2021-06-01 (×6): 40 mg via ORAL
  Filled 2021-05-25 (×7): qty 2

## 2021-05-25 MED ORDER — SODIUM CHLORIDE 0.9 % IV SOLN: INTRAVENOUS | Status: DC

## 2021-05-25 MED ORDER — CEFAZOLIN SODIUM-DEXTROSE 2-4 GM/100ML-% IV SOLN
2.0000 g | INTRAVENOUS | Status: DC
Start: 2021-05-26 — End: 2021-05-25

## 2021-05-25 MED ORDER — PREDNISONE 20 MG PO TABS
20.0000 mg | ORAL_TABLET | Freq: Once | ORAL | Status: AC
  Administered 2021-05-25: 20 mg via ORAL
  Filled 2021-05-25: qty 1

## 2021-05-25 MED ORDER — CEFAZOLIN SODIUM-DEXTROSE 2-4 GM/100ML-% IV SOLN
2.0000 g | INTRAVENOUS | Status: DC
  Administered 2021-05-28: 2 g via INTRAVENOUS
  Filled 2021-05-25 (×2): qty 100

## 2021-05-25 MED ORDER — SODIUM CHLORIDE 0.9 % IV SOLN
INTRAVENOUS | Status: DC | PRN
Start: 2021-05-25 — End: 2021-05-25

## 2021-05-25 MED ORDER — PROPOFOL 500 MG/50ML IV EMUL
INTRAVENOUS | Status: DC | PRN
Start: 2021-05-25 — End: 2021-05-25
  Administered 2021-05-25: 100 ug/kg/min via INTRAVENOUS

## 2021-05-25 MED ORDER — AMLODIPINE BESYLATE 10 MG PO TABS
10.0000 mg | ORAL_TABLET | Freq: Every day | ORAL | Status: DC
  Administered 2021-05-25 – 2021-06-04 (×9): 10 mg via ORAL
  Filled 2021-05-25 (×10): qty 1

## 2021-05-25 MED ORDER — HYDRALAZINE HCL 20 MG/ML IJ SOLN
10.0000 mg | Freq: Four times a day (QID) | INTRAMUSCULAR | Status: DC | PRN
  Administered 2021-05-25: 10 mg via INTRAVENOUS
  Filled 2021-05-25: qty 1

## 2021-05-25 MED ORDER — SODIUM CHLORIDE 0.9 % IV SOLN
INTRAVENOUS | Status: AC | PRN
  Administered 2021-05-25: 500 mL via INTRAVENOUS

## 2021-05-25 NOTE — Anesthesia Postprocedure Evaluation (Signed)
Anesthesia Post Note  Patient: Kathleen [name]  Procedure(s) Performed: TRANSESOPHAGEAL ECHOCARDIOGRAM (TEE) BUBBLE STUDY     Patient location during evaluation: Endoscopy Anesthesia Type: MAC Level of consciousness: awake and alert Pain management: pain level controlled Vital Signs Assessment: post-procedure vital signs reviewed and stable Respiratory status: spontaneous breathing, nonlabored ventilation, respiratory function stable and patient connected to nasal cannula oxygen Cardiovascular status: stable and blood pressure returned to baseline Postop Assessment: no apparent nausea or vomiting Anesthetic complications: no   No notable events documented.  Last Vitals:  Vitals:   05/25/21 1456 05/25/21 1506  BP: 131/74 127/70  Pulse: 98 94  Resp: 17 19  Temp:    SpO2: 100% 100%    Last Pain:  Vitals:   05/25/21 1506  TempSrc:   PainSc: 2                  Alizah Sills DANIEL

## 2021-05-25 NOTE — Progress Notes (Signed)
°  Echocardiogram 2D Echocardiogram has been performed.  Kathleen [name] 05/25/2021, 3:02 PM

## 2021-05-25 NOTE — Transfer of Care (Addendum)
Immediate Anesthesia Transfer of Care Note  Patient: Kathleen [name]  Procedure(s) Performed: TRANSESOPHAGEAL ECHOCARDIOGRAM (TEE) BUBBLE STUDY  Patient Location: Endoscopy Unit  Anesthesia Type:MAC  Level of Consciousness: awake, oriented and patient cooperative  Airway & Oxygen Therapy: Patient Spontanous Breathing and Patient connected to face mask oxygen  Post-op Assessment: Report given to RN and Post -op Vital signs reviewed and stable  Post vital signs: Reviewed  Last Vitals:  Vitals Value Taken Time  BP 121/71 05/25/21 1445  Temp    Pulse    Resp 21 05/25/21 1445  SpO2    Vitals shown include unvalidated device data.  Last Pain:  Vitals:   05/25/21 1253  TempSrc: Temporal  PainSc: 4       Patients Stated Pain Goal: 2 (62/19/47 1252)  Complications: No notable events documented.

## 2021-05-25 NOTE — Progress Notes (Signed)
Nephrology Follow-Up Consult note   Assessment/Recommendations: Kathleen [name] is a/an 61 y.o. female with a past medical history significant for ESRD, admitted for COVID, strep pneumo bacteremia.     [name]   3h 66min  50.8kg 350/1.5  2/2 bath F160  LUE AVG    - last post weight 1/3 was 48.2 kg  - sensipar 60 mg three times a week  - hectorol 6 mcg IV tiw  - venofer 100 mg weekly  - mircera 75 mcg q 2wks last 12/20, held 1/3 due to [place]  ESRD: HD on TTS schedule.  Strep pneumo bacteremia: On IV antibiotics.  Followed by ID.  Planning for TEE today  COVID-pneumonia: Continue treatment per primary team  EDW/BP: Under dry weight.  Appears euvolemic.  Continue to remove fluid as able.  GI bleeding: intermittent hematochezia. Outpatient endoscopic eval. CTM per primary  Anemia of CKD: Hgb 10, no esa. No iron given infection  Secondary hyperparathyroidism: Continue Hectorol and Sensipar.  Phos has been low. Hold binder  SLE: on prednisone per primary   Recommendations conveyed to primary service.    [facility] 05/25/2021 10:12 AM  ___________________________________________________________  CC: ESRD, COVID, strep pneumo bacteremia  Interval History/Subjective: Patient tired today but denies any other complaints.  Planning for TEE today.   Medications:  Current Facility-Administered Medications  Medication Dose Route Frequency Provider Last Rate Last Admin   0.9 %  sodium chloride infusion  250 mL Intravenous PRN Orma Flaming, MD       acetaminophen (TYLENOL) tablet 650 mg  650 mg Oral Q6H PRN Orma Flaming, MD   650 mg at 05/20/21 8416   Or   acetaminophen (TYLENOL) suppository 650 mg  650 mg Rectal Q6H PRN Orma Flaming, MD       amLODipine (NORVASC) tablet 10 mg  10 mg Oral Daily Thurnell Lose, MD       Chlorhexidine Gluconate Cloth 2 % PADS 6 each  6 each Topical Q0600 Claudia Desanctis, MD   6 each at 05/25/21 0900    cinacalcet (SENSIPAR) tablet 60 mg  60 mg Oral Q T,Th,Sa-HD Claudia Desanctis, MD   60 mg at 05/23/21 1339   [START ON 05/26/2021] doxercalciferol (HECTOROL) injection 6 mcg  6 mcg Intravenous Q T,Th,Sa-HD Roney Jaffe, MD       famotidine (PEPCID) tablet 20 mg  20 mg Oral Daily Ghimire, Shanker M, MD   20 mg at 05/25/21 0900   feeding supplement (NEPRO CARB STEADY) liquid 237 mL  237 mL Oral TID BM Jonetta Osgood, MD   237 mL at 05/24/21 2006   heparin injection 5,000 Units  5,000 Units Subcutaneous Q8H Jonetta Osgood, MD   5,000 Units at 05/25/21 0426   hydrALAZINE (APRESOLINE) injection 10 mg  10 mg Intravenous Q6H PRN Thurnell Lose, MD       multivitamin (RENA-VIT) tablet 1 tablet  1 tablet Oral QHS Jonetta Osgood, MD   1 tablet at 05/24/21 2143   naloxone (NARCAN) injection 0.4 mg  0.4 mg Intravenous PRN Thurnell Lose, MD       nicotine (NICODERM CQ - dosed in mg/24 hours) patch 14 mg  14 mg Transdermal Daily Orma Flaming, MD   14 mg at 05/25/21 0900   oxyCODONE (Oxy IR/ROXICODONE) immediate release tablet 10 mg  10 mg Oral Q4H PRN Thurnell Lose, MD   10 mg at 05/25/21 0900   penicillin  G potassium 2 Million Units in dextrose 5 % 50 mL IVPB  2 Million Units Intravenous Q6H Laurice Record, MD 100 mL/hr at 05/25/21 0426 2 Million Units at 05/25/21 0426   predniSONE (DELTASONE) tablet 20 mg  20 mg Oral Q breakfast Thurnell Lose, MD   20 mg at 05/25/21 0900   sevelamer carbonate (RENVELA) tablet 1,600 mg  1,600 mg Oral TID WC Orma Flaming, MD   1,600 mg at 05/25/21 0900   sodium chloride flush (NS) 0.9 % injection 3 mL  3 mL Intravenous Q12H Orma Flaming, MD   3 mL at 05/24/21 2007   sodium chloride flush (NS) 0.9 % injection 3 mL  3 mL Intravenous PRN Orma Flaming, MD          Review of Systems: 10 systems reviewed and negative except per interval history/subjective  Physical Exam: Vitals:   05/25/21 0452 05/25/21 0811  BP: (!) 158/92 (!) 170/95   Pulse: 88 90  Resp: 17 16  Temp: 98.1 F (36.7 C) 97.7 F (36.5 C)  SpO2: 100%    No intake/output data recorded.  Intake/Output Summary (Last 24 hours) at 05/25/2021 1012 Last data filed at 05/24/2021 1900 Gross per 24 hour  Intake 220 ml  Output --  Net 220 ml   Constitutional: well-appearing, no acute distress ENMT: ears and nose without scars or lesions, MMM CV: normal rate, no edema Respiratory: bilateral chest rise, normal work of breathing Gastrointestinal: soft, non-tender, no palpable masses or hernias Skin: no visible lesions or rashes Psych: alert, appropriate mood and affect   Test Results I personally reviewed new and old clinical labs and radiology tests Lab Results  Component Value Date   NA 133 (L) 05/25/2021   K 4.3 05/25/2021   CL 95 (L) 05/25/2021   CO2 30 05/25/2021   BUN 51 (H) 05/25/2021   CREATININE 5.73 (H) 05/25/2021   CALCIUM 9.0 05/25/2021   ALBUMIN 1.9 (L) 05/25/2021   PHOS 2.3 (L) 05/24/2021

## 2021-05-25 NOTE — Interval H&P Note (Signed)
History and Physical Interval Note:  05/25/2021 1:10 PM  Kathleen [name]  has presented today for surgery, with the diagnosis of BACTEREMIA.  The various methods of treatment have been discussed with the patient and family. After consideration of risks, benefits and other options for treatment, the patient has consented to  Procedure(s): TRANSESOPHAGEAL ECHOCARDIOGRAM (TEE) (N/A) as a surgical intervention.  The patient's history has been reviewed, patient examined, no change in status, stable for surgery.  I have reviewed the patient's chart and labs.  Questions were answered to the patient's satisfaction.    NPO for TEE for bacteremia. No difficulties swallowing. Labs reviewed.   [place] Bells T. Audie Box, MD, [place]  [address], [place], [place] [address] [phone]  1:10 PM

## 2021-05-25 NOTE — CV Procedure (Signed)
° ° °  TRANSESOPHAGEAL ECHOCARDIOGRAM   NAME:  Kathleen [name]    MRN: [id number] DOB:  [birth year]    ADMIT DATE: 05/15/2021  INDICATIONS: Bacteremia  PROCEDURE:   Informed consent was obtained prior to the procedure. The risks, benefits and alternatives for the procedure were discussed and the patient comprehended these risks.  Risks include, but are not limited to, cough, sore throat, vomiting, nausea, somnolence, esophageal and stomach trauma or perforation, bleeding, low blood pressure, aspiration, pneumonia, infection, trauma to the teeth and death.    Procedural time out performed. The oropharynx was anesthetized with topical 1% benzocaine.    Anesthesia was administered by Dr. Doroteo Glassman.  The patient was administered 140 mg of propofol and 0 mg of lidocaine to achieve and maintain moderate conscious sedation.  The patient's heart rate, blood pressure, and oxygen saturation are monitored continuously during the procedure. The period of conscious sedation is 12 minutes, of which I was present face-to-face 100% of this time.   The transesophageal probe was inserted in the esophagus and stomach without difficulty and multiple views were obtained.   COMPLICATIONS:    There were no immediate complications.  KEY FINDINGS:  No endocarditis.  Normal LV/RV function.  Full report to follow. Further management per primary team.   [place] Bells T. Audie Box, MD, [place]  [address] [place], [place] [address] [phone]  2:35 PM

## 2021-05-25 NOTE — Anesthesia Preprocedure Evaluation (Addendum)
Anesthesia Evaluation  Patient identified by MRN, date of birth, ID band Patient awake    Reviewed: Allergy & Precautions, NPO status , Patient's Chart, lab work & pertinent test results  History of Anesthesia Complications (+) PONV and history of anesthetic complications  Airway Mallampati: II  TM Distance: >3 FB Neck ROM: Full    Dental  (+) Edentulous Upper, Edentulous Lower, Dental Advisory Given   Pulmonary sleep apnea , COPD, Current Smoker,  Covid+ 10 days ago   Pulmonary exam normal        Cardiovascular hypertension, Pt. on medications +CHF (LVEF 00-34%, grade 1 diastolic dysfunction)   Rhythm:Regular Rate:Normal  Echo 05/21/21: 1. Left ventricular ejection fraction, by estimation, is 45 to 50%. The  left ventricle has mildly decreased function. The left ventricle  demonstrates global hypokinesis. Left ventricular diastolic parameters are  consistent with Grade I diastolic  dysfunction (impaired relaxation).  2. Right ventricular systolic function is mildly reduced. The right  ventricular size is normal. There is normal pulmonary artery systolic  pressure. The estimated right ventricular systolic pressure is 91.7 mmHg.  3. Left atrial size was mildly dilated.  4. The mitral valve is normal in structure. No evidence of mitral valve  regurgitation. No evidence of mitral stenosis.  5. The aortic valve is tricuspid. Aortic valve regurgitation is mild.  Aortic valve sclerosis/calcification is present, without any evidence of  aortic stenosis.  6. Aortic dilatation noted. There is mild dilatation of the aortic root  and of the ascending aorta, measuring 40 mm.  7. The inferior vena cava is normal in size with greater than 50%  respiratory variability, suggesting right atrial pressure of 3 mmHg.    Neuro/Psych  Headaches, Seizures -,  PSYCHIATRIC DISORDERS (hx suicide attempt) TIA Neuromuscular disease     GI/Hepatic negative GI ROS, Neg liver ROS,   Endo/Other  negative endocrine ROS  Renal/GU ESRF and DialysisRenal diseaseCr 5.73 HD T/Th/Sat  Female GU complaint     Musculoskeletal  (+) Arthritis , Osteoarthritis,    Abdominal   Peds  Hematology hct 31.7   Anesthesia Other Findings Bacteremia   Reproductive/Obstetrics negative OB ROS                          Anesthesia Physical Anesthesia Plan  ASA: 3  Anesthesia Plan: MAC   Post-op Pain Management:    Induction:   PONV Risk Score and Plan: 2 and Propofol infusion and TIVA  Airway Management Planned: Natural Airway and Simple Face Mask  Additional Equipment: None  Intra-op Plan:   Post-operative Plan:   Informed Consent:   Plan Discussed with:   Anesthesia Plan Comments:         Anesthesia Quick Evaluation

## 2021-05-25 NOTE — Progress Notes (Addendum)
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    # Strep pneumoniae bacteremia #COVID infection SP remdesivir x 3days #ESRD on HD # Possible septic arthritis of L4-L5 -Pt has a productive cough, suspect that she has superimposed pneumonia leading to bacteremia. Although CXR today did not show consolidation and pt is no RA. Ordered sputum Cx. Repeated blood Cx to ensure clearance.  -TTE did not show valvular vegetation (05/21/21). Ordered TEE which did not show vegetation -MRI spine showed possible septic arthritis of L4-L5. IR engaged and fluid is not amenable to drainage.      #Left wrist pain/tenderness #LUE AVF #SLE with non adherence to plaquenil -Seen by Dr. Annabelle Harman at [facility] on 03/02/2018. Noted she was Dx at age 19 with ANA titer 1:1280 with centromere pattern and +dsDNA -  Pt reprots she has wrist pain for "a while". The CT w/ contrast of left wrist did not show an effusion or signs of infection.  -Etiology could be 2/2 reactive arthritis(asymmetric pain) vs  lupus arthritis is in the differential especially since she is no longer on lupus therapy. Pt was started on prednisone 40mg /day. When I saw her on Friday wrist pain had improved compared to today. Consider increasing prednisone back to 40mg /day.  Recommendations:  -D/C  penicillin -Start cefazolin with HD, anticipate  6 weeks of antibiotics form negative Cx(EOT 07/01/21) -Sputum Cx -Follow repeat blood Cx to ensure clearance -Agree with steroids(increase to prednisone 40mg  /day)    Microbiology:   Antibiotics: Ceftriaxone 1/11-1/12 Penicillin 1/13-p   Cultures: Blood 1/11 strep pneumoniae  1/12 NGTD  SUBJECTIVE: Pt resting in bed. She reports wrist pain is worse.  Interval: Afebrile overnight. Wbc 12K. Review of Systems: Review of Systems  All other systems reviewed and are negative.   Scheduled Meds:  Chlorhexidine Gluconate Cloth  6 each Topical Q0600   cinacalcet  60 mg Oral Q T,Th,Sa-HD   [START ON 05/26/2021] doxercalciferol  6 mcg Intravenous Q T,Th,Sa-HD   famotidine  20 mg Oral Daily   feeding supplement (NEPRO CARB STEADY)  237 mL Oral TID BM   heparin injection (subcutaneous)  5,000 Units Subcutaneous Q8H   multivitamin  1 tablet Oral QHS   nicotine  14 mg Transdermal Daily   predniSONE  20 mg Oral Q breakfast   sevelamer carbonate  1,600 mg Oral TID WC   sodium chloride flush  3 mL Intravenous Q12H   Continuous Infusions:  sodium chloride     pencillin G potassium IV 2 Million Units (05/25/21 0426)   PRN Meds:.sodium chloride, acetaminophen **OR** acetaminophen, LORazepam, naLOXone (NARCAN)  injection, oxyCODONE, sodium chloride flush No Known Allergies  OBJECTIVE: Vitals:   05/24/21 1630 05/24/21 2025 05/25/21 0452 05/25/21 0811  BP: 107/83 114/66 (!) 158/92 (!) 170/95  Pulse: 96 86  88 90  Resp: 17 18 17 16   Temp: 97.6 F (36.4 C) 98.8 F (37.1 C) 98.1 F (36.7 C) 97.7 F (36.5 C)  TempSrc: Oral  Oral Oral  SpO2: 100% 100% 100%   Weight:       Body mass index is 18.16 kg/m.  Physical Exam Constitutional:      Appearance: Normal appearance.  HENT:     Head: Normocephalic and atraumatic.     Right Ear: Tympanic membrane normal.     Left Ear: Tympanic membrane normal.     Nose: Nose normal.     Mouth/Throat:     Mouth: Mucous membranes are moist.   Eyes:     Extraocular Movements: Extraocular movements intact.     Conjunctiva/sclera: Conjunctivae normal.     Pupils: Pupils are equal, round, and reactive to light.  Cardiovascular:     Rate and Rhythm: Normal rate and regular rhythm.     Heart sounds: No murmur heard.   No friction rub. No gallop.  Pulmonary:     Effort: Pulmonary effort is normal.     Breath sounds: Normal breath sounds.  Abdominal:     General: Abdomen is flat.     Palpations: Abdomen is soft.  Musculoskeletal:     Comments: Left wrist lis tender, unable to move wrist.   Skin:    General: Skin is warm and dry.  Neurological:     General: No focal deficit present.     Mental Status: She is alert and oriented to person, place, and time.  Psychiatric:        Mood and Affect: Mood normal.      Lab Results Lab Results  Component Value Date   WBC 12.5 (H) 05/25/2021   HGB 10.0 (L) 05/25/2021   HCT 31.7 (L) 05/25/2021   MCV 89.5 05/25/2021   PLT 235 05/25/2021    Lab Results  Component Value Date   CREATININE 5.73 (H) 05/25/2021   BUN 51 (H) 05/25/2021   NA 133 (L) 05/25/2021   K 4.3 05/25/2021   CL 95 (L) 05/25/2021   CO2 30 05/25/2021    Lab Results  Component Value Date   ALT 12 05/25/2021   AST 18 05/25/2021   ALKPHOS 70 05/25/2021   BILITOT 0.4 05/25/2021        Laurice Record, [place] [facility] 05/25/2021, 9:50 AM

## 2021-05-25 NOTE — TOC Progression Note (Signed)
Transition of Care [facility]) - Progression Note    Patient Details  Name: Kathleen [name] MRN: [id number] Date of Birth: May 03, 1961  Transition of [facility] CM/SW Contact  Emeterio Reeve, [place] Phone Number: 05/25/2021, 4:41 PM  Clinical Narrative:     Pt was given bed offers. CSW unable to get pt on the phone.   Expected Discharge Plan: Skilled Nursing Facility Barriers to Discharge: Ship broker, Continued Medical Work up, SNF Covid  Expected Discharge Plan and Services Expected Discharge Plan: [place] arrangements for the past 2 months: Single Family Home                                       Social Determinants of Health (SDOH) Interventions    Readmission Risk Interventions No flowsheet data found.  Emeterio Reeve, LCSW Clinical Social Worker

## 2021-05-25 NOTE — H&P (View-Only) (Signed)
PROGRESS NOTE        PATIENT DETAILS Name: Kathleen [name] Age: 61 y.o. Sex: female Date of Birth: 12-06-1960 Admit Date: 05/15/2021 Admitting Physician Orma Flaming, MD [id number], Beverly Gust, NP  Brief Narrative:  Patient is a 61 y.o. female with history of ESRD on HD TTS, HFrEF, SLE, anemia due to CKD, HTN-who presented with at least 1 week history of worsening weakness/lethargy, intermittent diarrhea, hematochezia-she had missed her last 3 HD sessions prior to hospitalization due to weakness/lethargy.  She was found to have COVID-19 infection-and thought to have uremic encephalopathy in the setting of missed HD.  She stabilized with Remdesivir and hemodialysis.  Unfortunately-Hospital course was complicated by severe arthralgias mostly in her left wrist/spine-low-grade fever-she was subsequently found to have streptococcal pneumonia bacteremia.  See below for further details.  Significant Hospital events 1/06>> admit with diarrhea/intermittent hematochezia from COVID-uremic encephalopathy from missed HD. 1/10>> developed arthralgias-mostly in her left wrist.Prednisone ordered 1/11>> low-grade fever-worsening left wrist arthralgia-CT left wrist negative for effusion.   1/11>> blood culture positive Streptococcus pneumoniae-started on IV Rocephin.  Subjective:  Patient in bed, appears comfortable and sleeping, easily arousable, still has chronic joint aches and pains mostly in her wrists left more than right and in her whole back, denies any headache, no fever, no chest pain or pressure, no shortness of breath , no abdominal pain. No new focal weakness.   Objective: Vitals: Blood pressure (!) 170/95, pulse 90, temperature 97.7 F (36.5 C), temperature source Oral, resp. rate 16, weight 48 kg, SpO2 100 %.   Exam:  Awake Alert, No new F.N deficits, Normal affect [place].AT,PERRAL Supple Neck, No JVD,   Symmetrical Chest wall movement, Good air movement  bilaterally, CTAB RRR,No Gallops, Rubs or new Murmurs,  +ve B.Sounds, Abd Soft, No tenderness,   No Cyanosis, Clubbing or edema       Assessment/Plan:  COVID-19 infection: Improved-diarrhea has resolved-no PNA on CXR-completed 3 days of Remdesivir   Diarrhea with intermittent hematochezia: Diarrhea due to COVID-19 infection-suspect hematochezia was in the setting of some mucosal sloughing.  Hematochezia and diarrhea have resolved.  Given significant debility/deconditioning/stable hemoglobin-suspect we can postpone endoscopic evaluation to the outpatient setting.  Discussed this with patient-she agrees.    Acute metabolic encephalopathy: Resolved.  Due to uremia in the setting of missed HD with some contribution from COVID-19.    Streptococcal pneumonia bacteremia: Developed low-grade fever/left wrist arthralgia/back pain on 1/10-1/11-blood cultures are positive for Streptococcus pneumonia.  No PNA on CXR.  TTE without vegetations.  MRI with suspicious septic arthritis at L4-L5 level-Per IR-this is not amenable to drainage.  TEE is scheduled for 05/25/2021.  Currently on IV penicillin per ID.  Repeat blood cultures drawn on 05/21/2021 are negative so far.  Case discussed with neurosurgeon Dr. Kathyrn Sheriff including the MRI findings, does not think these findings are of any significance.  No further work-up for this issue of nonspecific MRI changes  Left wrist arthralgia: Slowly improving after initiation of steroids on 1/10 and IV antibiotics on 1/11.  Although she has a history of lupus with positive ANA-dsDNA level/complement levels are not suggestive of lupus flare.  Suspect this is a reactive arthritis from her underlying infectious issues.  CT of the left wrist did not show any effusion continue prednisone for the next few days-and taper based on clinical response.  SLE: Claims to  have a longstanding history of lupus-followed by rheumatology at [facility].  Per patient-her  Plaquenil was discontinued by her nephrologist several months ago-I currently on oral prednisone continue at 20 mg dose with outpatient follow-up with her rheumatologist at [facility] post discharge.  ESRD: Nephrology following-we will defer hemodialysis to nephrology service.  Hypokalemia: Repleted  Chronic systolic heart failure (EF 45-50% on 03/13/2021): Volume status is stable.  Watch closely.  HTN: BP now stable will add Norvasc.  Prolonged QTC: Appears to have develops some amount of QTC elongation over the past few months-repeat EKG on 1/11 and 1/13 with stable/normalized QTC.    We will attempt to keep K> 4, and Mg>2.  Monitor EKGs periodically-continue telemetry monitoring.    Tobacco abuse: Continue transdermal nicotine  Deconditioning/debility: Likely due to COVID-19 infection-and worsening uremia from missed HD.  Hopefully will continue to improve with further hemodialysis-improvement in underlying COVID-19 infection.  Evaluated by PT services-felt to require SNF on discharge.  Social worker following.   BMI Estimated body mass index is 18.16 kg/m as calculated from the following:   Height as of 03/12/21: 5\' 4"  (1.626 m).   Weight as of this encounter: 48 kg.    Procedures: None Consults: ID, nephrology.  Neurosurgeon Dr. Kathyrn Sheriff on 05/24/2021 over the phone. DVT Prophylaxis: SQ heparin Code Status:Full code  Family Communication: None at bedside  Disposition Plan: Status is: Inpatient  Severe debility/deconditioning-now with worsening encephalopathy due to uremia-needs hemodialysis today.  On IV Remdesivir for COVID.  SNF planned on discharge.  Diet: Diet Order             Diet NPO time specified Except for: Sips with Meds  Diet effective midnight                    MEDICATIONS: Scheduled Meds:  Chlorhexidine Gluconate Cloth  6 each Topical Q0600   cinacalcet  60 mg Oral Q T,Th,Sa-HD   [START ON 05/26/2021] doxercalciferol  6 mcg Intravenous Q T,Th,Sa-HD    famotidine  20 mg Oral Daily   feeding supplement (NEPRO CARB STEADY)  237 mL Oral TID BM   heparin injection (subcutaneous)  5,000 Units Subcutaneous Q8H   multivitamin  1 tablet Oral QHS   nicotine  14 mg Transdermal Daily   predniSONE  20 mg Oral Q breakfast   sevelamer carbonate  1,600 mg Oral TID WC   sodium chloride flush  3 mL Intravenous Q12H   Continuous Infusions:  sodium chloride     pencillin G potassium IV 2 Million Units (05/25/21 0426)   PRN Meds:.sodium chloride, acetaminophen **OR** acetaminophen, naLOXone (NARCAN)  injection, oxyCODONE, sodium chloride flush   I have personally reviewed following labs and imaging studies  LABORATORY DATA:  Recent Labs  Lab 05/21/21 0222 05/22/21 0549 05/23/21 0410 05/24/21 0119 05/25/21 0029  WBC 17.5* 18.5* 12.0* 11.2* 12.5*  HGB 10.7* 10.3* 9.9* 8.9* 10.0*  HCT 32.6* 29.9* 30.0* 27.7* 31.7*  PLT 179 199 178 214 235  MCV 85.1 84.7 87.0 86.6 89.5  MCH 27.9 29.2 28.7 27.8 28.2  MCHC 32.8 34.4 33.0 32.1 31.5  RDW 18.5* 18.8* 19.3* 19.1* 19.3*  LYMPHSABS  --   --   --   --  1.2  MONOABS  --   --   --   --  1.0  EOSABS  --   --   --   --  0.0  BASOSABS  --   --   --   --  0.0    Recent Labs  Lab 05/20/21 0142 05/21/21 0222 05/22/21 0549 05/23/21 0410 05/24/21 0119 05/24/21 0728 05/25/21 0029  NA 135 131* 129* 135 131*  --  133*  K 3.9 3.8 4.3 4.3 4.6  --  4.3  CL 94* 89* 87* 94* 93*  --  95*  CO2 26 26 27 26 27   --  30  GLUCOSE 105* 110* 104* 115* 98  --  100*  BUN 33* 55* 88* 65* 91*  --  51*  CREATININE 7.21* 9.42* 11.24* 7.04* 8.60*  --  5.73*  CALCIUM 7.2* 7.3* 8.0* 8.8* 8.7*  --  9.0  AST  --   --   --   --   --   --  18  ALT  --   --   --   --   --   --  12  ALKPHOS  --   --   --   --   --   --  70  BILITOT  --   --   --   --   --   --  0.4  ALBUMIN 2.6* 2.3* 2.2* 2.1* 1.8*  --  1.9*  MG 2.1 2.4  --  2.5* 2.8*  --  2.5*  CRP  --   --   --   --   --   --  7.1*  PROCALCITON  --   --   --   --   --   6.99 4.20    RADIOLOGY STUDIES/RESULTS: No results found.   LOS: 9 days   Signature  Lala Lund M.D on 05/25/2021 at 9:51 AM   -  To page go to [email or web address]

## 2021-05-25 NOTE — Progress Notes (Signed)
PROGRESS NOTE        PATIENT DETAILS Name: Kathleen [name] Age: 61 y.o. Sex: female Date of Birth: 1960-11-26 Admit Date: 05/15/2021 Admitting Physician Orma Flaming, MD [id number], Beverly Gust, NP  Brief Narrative:  Patient is a 61 y.o. female with history of ESRD on HD TTS, HFrEF, SLE, anemia due to CKD, HTN-who presented with at least 1 week history of worsening weakness/lethargy, intermittent diarrhea, hematochezia-she had missed her last 3 HD sessions prior to hospitalization due to weakness/lethargy.  She was found to have COVID-19 infection-and thought to have uremic encephalopathy in the setting of missed HD.  She stabilized with Remdesivir and hemodialysis.  Unfortunately-Hospital course was complicated by severe arthralgias mostly in her left wrist/spine-low-grade fever-she was subsequently found to have streptococcal pneumonia bacteremia.  See below for further details.  Significant Hospital events 1/06>> admit with diarrhea/intermittent hematochezia from COVID-uremic encephalopathy from missed HD. 1/10>> developed arthralgias-mostly in her left wrist.Prednisone ordered 1/11>> low-grade fever-worsening left wrist arthralgia-CT left wrist negative for effusion.   1/11>> blood culture positive Streptococcus pneumoniae-started on IV Rocephin.  Subjective:  Patient in bed, appears comfortable and sleeping, easily arousable, still has chronic joint aches and pains mostly in her wrists left more than right and in her whole back, denies any headache, no fever, no chest pain or pressure, no shortness of breath , no abdominal pain. No new focal weakness.   Objective: Vitals: Blood pressure (!) 170/95, pulse 90, temperature 97.7 F (36.5 C), temperature source Oral, resp. rate 16, weight 48 kg, SpO2 100 %.   Exam:  Awake Alert, No new F.N deficits, Normal affect [place].AT,PERRAL Supple Neck, No JVD,   Symmetrical Chest wall movement, Good air movement  bilaterally, CTAB RRR,No Gallops, Rubs or new Murmurs,  +ve B.Sounds, Abd Soft, No tenderness,   No Cyanosis, Clubbing or edema       Assessment/Plan:  COVID-19 infection: Improved-diarrhea has resolved-no PNA on CXR-completed 3 days of Remdesivir   Diarrhea with intermittent hematochezia: Diarrhea due to COVID-19 infection-suspect hematochezia was in the setting of some mucosal sloughing.  Hematochezia and diarrhea have resolved.  Given significant debility/deconditioning/stable hemoglobin-suspect we can postpone endoscopic evaluation to the outpatient setting.  Discussed this with patient-she agrees.    Acute metabolic encephalopathy: Resolved.  Due to uremia in the setting of missed HD with some contribution from COVID-19.    Streptococcal pneumonia bacteremia: Developed low-grade fever/left wrist arthralgia/back pain on 1/10-1/11-blood cultures are positive for Streptococcus pneumonia.  No PNA on CXR.  TTE without vegetations.  MRI with suspicious septic arthritis at L4-L5 level-Per IR-this is not amenable to drainage.  TEE is scheduled for 05/25/2021.  Currently on IV penicillin per ID.  Repeat blood cultures drawn on 05/21/2021 are negative so far.  Case discussed with neurosurgeon Dr. Kathyrn Sheriff including the MRI findings, does not think these findings are of any significance.  No further work-up for this issue of nonspecific MRI changes  Left wrist arthralgia: Slowly improving after initiation of steroids on 1/10 and IV antibiotics on 1/11.  Although she has a history of lupus with positive ANA-dsDNA level/complement levels are not suggestive of lupus flare.  Suspect this is a reactive arthritis from her underlying infectious issues.  CT of the left wrist did not show any effusion continue prednisone for the next few days-and taper based on clinical response.  SLE: Claims to  have a longstanding history of lupus-followed by rheumatology at [facility].  Per patient-her  Plaquenil was discontinued by her nephrologist several months ago-I currently on oral prednisone continue at 20 mg dose with outpatient follow-up with her rheumatologist at [facility] post discharge.  ESRD: Nephrology following-we will defer hemodialysis to nephrology service.  Hypokalemia: Repleted  Chronic systolic heart failure (EF 45-50% on 03/13/2021): Volume status is stable.  Watch closely.  HTN: BP now stable will add Norvasc.  Prolonged QTC: Appears to have develops some amount of QTC elongation over the past few months-repeat EKG on 1/11 and 1/13 with stable/normalized QTC.    We will attempt to keep K> 4, and Mg>2.  Monitor EKGs periodically-continue telemetry monitoring.    Tobacco abuse: Continue transdermal nicotine  Deconditioning/debility: Likely due to COVID-19 infection-and worsening uremia from missed HD.  Hopefully will continue to improve with further hemodialysis-improvement in underlying COVID-19 infection.  Evaluated by PT services-felt to require SNF on discharge.  Social worker following.   BMI Estimated body mass index is 18.16 kg/m as calculated from the following:   Height as of 03/12/21: 5\' 4"  (1.626 m).   Weight as of this encounter: 48 kg.    Procedures: None Consults: ID, nephrology.  Neurosurgeon Dr. Kathyrn Sheriff on 05/24/2021 over the phone. DVT Prophylaxis: SQ heparin Code Status:Full code  Family Communication: None at bedside  Disposition Plan: Status is: Inpatient  Severe debility/deconditioning-now with worsening encephalopathy due to uremia-needs hemodialysis today.  On IV Remdesivir for COVID.  SNF planned on discharge.  Diet: Diet Order             Diet NPO time specified Except for: Sips with Meds  Diet effective midnight                    MEDICATIONS: Scheduled Meds:  Chlorhexidine Gluconate Cloth  6 each Topical Q0600   cinacalcet  60 mg Oral Q T,Th,Sa-HD   [START ON 05/26/2021] doxercalciferol  6 mcg Intravenous Q T,Th,Sa-HD    famotidine  20 mg Oral Daily   feeding supplement (NEPRO CARB STEADY)  237 mL Oral TID BM   heparin injection (subcutaneous)  5,000 Units Subcutaneous Q8H   multivitamin  1 tablet Oral QHS   nicotine  14 mg Transdermal Daily   predniSONE  20 mg Oral Q breakfast   sevelamer carbonate  1,600 mg Oral TID WC   sodium chloride flush  3 mL Intravenous Q12H   Continuous Infusions:  sodium chloride     pencillin G potassium IV 2 Million Units (05/25/21 0426)   PRN Meds:.sodium chloride, acetaminophen **OR** acetaminophen, naLOXone (NARCAN)  injection, oxyCODONE, sodium chloride flush   I have personally reviewed following labs and imaging studies  LABORATORY DATA:  Recent Labs  Lab 05/21/21 0222 05/22/21 0549 05/23/21 0410 05/24/21 0119 05/25/21 0029  WBC 17.5* 18.5* 12.0* 11.2* 12.5*  HGB 10.7* 10.3* 9.9* 8.9* 10.0*  HCT 32.6* 29.9* 30.0* 27.7* 31.7*  PLT 179 199 178 214 235  MCV 85.1 84.7 87.0 86.6 89.5  MCH 27.9 29.2 28.7 27.8 28.2  MCHC 32.8 34.4 33.0 32.1 31.5  RDW 18.5* 18.8* 19.3* 19.1* 19.3*  LYMPHSABS  --   --   --   --  1.2  MONOABS  --   --   --   --  1.0  EOSABS  --   --   --   --  0.0  BASOSABS  --   --   --   --  0.0    Recent Labs  Lab 05/20/21 0142 05/21/21 0222 05/22/21 0549 05/23/21 0410 05/24/21 0119 05/24/21 0728 05/25/21 0029  NA 135 131* 129* 135 131*  --  133*  K 3.9 3.8 4.3 4.3 4.6  --  4.3  CL 94* 89* 87* 94* 93*  --  95*  CO2 26 26 27 26 27   --  30  GLUCOSE 105* 110* 104* 115* 98  --  100*  BUN 33* 55* 88* 65* 91*  --  51*  CREATININE 7.21* 9.42* 11.24* 7.04* 8.60*  --  5.73*  CALCIUM 7.2* 7.3* 8.0* 8.8* 8.7*  --  9.0  AST  --   --   --   --   --   --  18  ALT  --   --   --   --   --   --  12  ALKPHOS  --   --   --   --   --   --  70  BILITOT  --   --   --   --   --   --  0.4  ALBUMIN 2.6* 2.3* 2.2* 2.1* 1.8*  --  1.9*  MG 2.1 2.4  --  2.5* 2.8*  --  2.5*  CRP  --   --   --   --   --   --  7.1*  PROCALCITON  --   --   --   --   --   6.99 4.20    RADIOLOGY STUDIES/RESULTS: No results found.   LOS: 9 days   Signature  Lala Lund M.D on 05/25/2021 at 9:51 AM   -  To page go to [email or web address]

## 2021-05-25 NOTE — Anesthesia Procedure Notes (Signed)
Procedure Name: MAC Date/Time: 05/25/2021 2:05 PM Performed by: Jenne Campus, CRNA Pre-anesthesia Checklist: Patient identified, Emergency Drugs available, Suction available and Patient being monitored Oxygen Delivery Method: Simple face mask

## 2021-05-25 NOTE — Progress Notes (Signed)
Contacted [place] and spoke to [place], [place] manager. Clinic aware pt tested positive for covid but feel pt should be able to resume regular out-pt schedule at d/c. Update provided to CSW for snf placement purposes. Will assist as needed.  Melven Sartorius Renal Navigator [phone]

## 2021-05-26 LAB — CBC WITH DIFFERENTIAL/PLATELET
Abs Immature Granulocytes: 0.11 10*3/uL — ABNORMAL HIGH (ref 0.00–0.07)
Basophils Absolute: 0 10*3/uL (ref 0.0–0.1)
Basophils Relative: 0 %
Eosinophils Absolute: 0 10*3/uL (ref 0.0–0.5)
Eosinophils Relative: 0 %
HCT: 28.3 % — ABNORMAL LOW (ref 36.0–46.0)
Hemoglobin: 9.2 g/dL — ABNORMAL LOW (ref 12.0–15.0)
Immature Granulocytes: 1 %
Lymphocytes Relative: 6 %
Lymphs Abs: 0.8 10*3/uL (ref 0.7–4.0)
MCH: 28.5 pg (ref 26.0–34.0)
MCHC: 32.5 g/dL (ref 30.0–36.0)
MCV: 87.6 fL (ref 80.0–100.0)
Monocytes Absolute: 0.6 10*3/uL (ref 0.1–1.0)
Monocytes Relative: 5 %
Neutro Abs: 10.6 10*3/uL — ABNORMAL HIGH (ref 1.7–7.7)
Neutrophils Relative %: 88 %
Platelets: 254 10*3/uL (ref 150–400)
RBC: 3.23 MIL/uL — ABNORMAL LOW (ref 3.87–5.11)
RDW: 19.3 % — ABNORMAL HIGH (ref 11.5–15.5)
WBC: 12.1 10*3/uL — ABNORMAL HIGH (ref 4.0–10.5)
nRBC: 0 % (ref 0.0–0.2)

## 2021-05-26 LAB — C-REACTIVE PROTEIN: CRP: 9.6 mg/dL — ABNORMAL HIGH (ref <1.0)

## 2021-05-26 LAB — COMPREHENSIVE METABOLIC PANEL
ALT: 11 U/L (ref 0–44)
AST: 15 U/L (ref 15–41)
Albumin: 1.8 g/dL — ABNORMAL LOW (ref 3.5–5.0)
Alkaline Phosphatase: 63 U/L (ref 38–126)
Anion gap: 12 (ref 5–15)
BUN: 73 mg/dL — ABNORMAL HIGH (ref 6–20)
CO2: 23 mmol/L (ref 22–32)
Calcium: 8.5 mg/dL — ABNORMAL LOW (ref 8.9–10.3)
Chloride: 97 mmol/L — ABNORMAL LOW (ref 98–111)
Creatinine, Ser: 7.65 mg/dL — ABNORMAL HIGH (ref 0.44–1.00)
GFR, Estimated: 6 mL/min — ABNORMAL LOW (ref >60)
Glucose, Bld: 94 mg/dL (ref 70–99)
Potassium: 6 mmol/L — ABNORMAL HIGH (ref 3.5–5.1)
Sodium: 132 mmol/L — ABNORMAL LOW (ref 135–145)
Total Bilirubin: 0.4 mg/dL (ref 0.3–1.2)
Total Protein: 5.9 g/dL — ABNORMAL LOW (ref 6.5–8.1)

## 2021-05-26 LAB — CULTURE, BLOOD (ROUTINE X 2)
Culture: NO GROWTH
Culture: NO GROWTH
Special Requests: ADEQUATE
Special Requests: ADEQUATE

## 2021-05-26 LAB — MAGNESIUM: Magnesium: 2.5 mg/dL — ABNORMAL HIGH (ref 1.7–2.4)

## 2021-05-26 LAB — URIC ACID: Uric Acid, Serum: 5.9 mg/dL (ref 2.5–7.1)

## 2021-05-26 LAB — PROCALCITONIN: Procalcitonin: 2.77 ng/mL

## 2021-05-26 MED ORDER — SODIUM ZIRCONIUM CYCLOSILICATE 10 G PO PACK
10.0000 g | PACK | Freq: Once | ORAL | Status: AC
  Administered 2021-05-26: 10 g via ORAL
  Filled 2021-05-26: qty 1

## 2021-05-26 NOTE — TOC Progression Note (Signed)
Transition of Care [facility]) - Progression Note    Patient Details  Name: Kathleen [name] MRN: [id number] Date of Birth: [birth year]  Transition of Care [facility]) CM/SW Contact  Emeterio Reeve, [place] Phone Number: 05/26/2021, 4:18 PM  Clinical Narrative:     Pt chose [facility] for SNF. [place] states they no longer have a bed. CSW reaced out to [facility], They stated they have no more HD beds.   Expected Discharge Plan: Skilled Nursing Facility Barriers to Discharge: Ship broker, Continued Medical Work up, SNF Covid  Expected Discharge Plan and Services Expected Discharge Plan: [place] arrangements for the past 2 months: Single Family Home                                       Social Determinants of Health (SDOH) Interventions    Readmission Risk Interventions No flowsheet data found.

## 2021-05-26 NOTE — Progress Notes (Signed)
OT Cancellation Note  Patient Details Name: Mckenleigh Tarlton MRN: [id number] DOB: 06/19/1960   Cancelled Treatment:    Reason Eval/Treat Not Completed: Patient at procedure or test/ unavailable;Other (comment) (RN reporting transport on the way to take pt to HD. Will follow as schedule allows.)  Lynnda Child, OTD, OTR/L Acute Rehab [phone] - [place] 05/26/2021, 3:58 PM

## 2021-05-26 NOTE — Progress Notes (Signed)
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    # Strep pneumoniae bacteremia #COVID infection SP remdesivir x 3days #ESRD on HD # Possible septic arthritis of L4-L5 -Pt has a productive cough, suspect that she has superimposed pneumonia leading to bacteremia. Although CXR today did not show consolidation and pt is no RA. Ordered sputum Cx. Repeated blood Cx to ensure clearance.  -TTE did not show valvular vegetation (05/21/21). Ordered TEE which did not show vegetation -MRI spine showed possible septic arthritis of L4-L5. IR engaged and fluid is not amenable to drainage.      #Left wrist pain/tenderness #LUE AVF #SLE with non adherence to plaquenil -Seen by Dr. Annabelle Harman at [facility] on 03/02/2018. Noted she was Dx at age 66 with ANA titer 1:1280 with centromere pattern and +dsDNA -  Pt reprots she has wrist pain for "a while". The CT w/ contrast of left wrist did not show an effusion or signs of infection.  -Etiology could be 2/2 reactive arthritis(asymmetric pain) vs  lupus arthritis is in the differential especially since she is no longer on lupus therapy. Pt was started on prednisone 40mg /day->20mg . When I saw her on Friday wrist pain had improved compared to this week. Prednisone increased to 40mg  this AM. Increased right hand swelling, will order MRI.  Recommendations:   -Continue cefazolin with HD, anticipate 6 weeks of antibiotics form negative Cx(EOT 07/01/21) -Sputum Cx -Follow repeat blood Cx to ensure clearance -Agree with steroids(increase to prednisone 40mg  /day) -MRI left wrist w/o     Microbiology:   Antibiotics: Ceftriaxone 1/11-1/12 Penicillin 1/13-p   Cultures: Blood 1/11 strep pneumoniae  1/12 NGTD  SUBJECTIVE: Pt resting in bed. Complains of worsening left hand pain and swelling.  Interval: Afebrile overnight. Wbc 12K. Review of Systems: Review of Systems  All other systems reviewed and are negative.   Scheduled Meds:  amLODipine  10 mg Oral Daily   Chlorhexidine Gluconate Cloth  6 each Topical Q0600   cinacalcet  60 mg Oral Q T,Th,Sa-HD   doxercalciferol  6 mcg Intravenous Q T,Th,Sa-HD   famotidine  20 mg Oral Daily   feeding supplement (NEPRO CARB STEADY)  237 mL Oral TID BM   heparin injection (subcutaneous)  5,000 Units Subcutaneous Q8H   multivitamin  1 tablet Oral QHS   nicotine  14 mg Transdermal Daily   predniSONE  40 mg Oral Q breakfast   sodium chloride flush  3 mL Intravenous Q12H   Continuous Infusions:  sodium chloride      ceFAZolin (ANCEF) IV     PRN Meds:.sodium chloride, acetaminophen **OR** acetaminophen, hydrALAZINE, naLOXone (NARCAN)  injection, oxyCODONE, sodium chloride flush No Known Allergies  OBJECTIVE: Vitals:   05/25/21 1555 05/25/21 1938 05/26/21 0421 05/26/21 0821  BP: 110/75 (!) 153/99 140/86 (!) 146/98  Pulse: 96 96 97 82  Resp: 16 17 18 18   Temp: 98.7 F (37.1 C) 98.1 F (36.7 C) 98.1 F (36.7 C) 97.7 F (36.5 C)  TempSrc:  Oral Oral Oral  SpO2: 100% 100% 97% 98%  Weight:      Height:       Body mass index is 18.16 kg/m.  Physical Exam Constitutional:      Appearance: Normal appearance.  HENT:     Head: Normocephalic and atraumatic.     Right Ear: Tympanic membrane normal.     Left Ear: Tympanic membrane normal.     Nose: Nose normal.     Mouth/Throat:      Mouth: Mucous membranes are moist.  Eyes:     Extraocular Movements: Extraocular movements intact.     Conjunctiva/sclera: Conjunctivae normal.     Pupils: Pupils are equal, round, and reactive to light.  Cardiovascular:     Rate and Rhythm: Normal rate and regular rhythm.     Heart sounds: No murmur heard.   No friction rub. No gallop.  Pulmonary:     Effort: Pulmonary effort is normal.     Breath sounds: Normal breath sounds.  Abdominal:     General: Abdomen is flat.     Palpations: Abdomen is soft.  Musculoskeletal:     Comments: Left wrist lis tender, unable to move wrist.   Skin:    General: Skin is warm and dry.  Neurological:     General: No focal deficit present.     Mental Status: She is alert and oriented to person, place, and time.  Psychiatric:        Mood and Affect: Mood normal.      Lab Results Lab Results  Component Value Date   WBC 12.1 (H) 05/26/2021   HGB 9.2 (L) 05/26/2021   HCT 28.3 (L) 05/26/2021   MCV 87.6 05/26/2021   PLT 254 05/26/2021    Lab Results  Component Value Date   CREATININE 7.65 (H) 05/26/2021   BUN 73 (H) 05/26/2021   NA 132 (L) 05/26/2021   K 6.0 (H) 05/26/2021   CL 97 (L) 05/26/2021   CO2 23 05/26/2021    Lab Results  Component Value Date   ALT 11 05/26/2021   AST 15 05/26/2021   ALKPHOS 63 05/26/2021   BILITOT 0.4 05/26/2021        Laurice Record, [place] for Infectious Disease [facility] 05/26/2021, 9:11 AM

## 2021-05-26 NOTE — Progress Notes (Signed)
Physical Therapy Treatment Patient Details Name: Kathleen [name] MRN: [id number] DOB: [birth year] Today's Date: 05/26/2021   History of Present Illness Pt is a 61 y.o. female who presented 05/15/21 with weakness. Pt with GI bleed. work up underway, but current diagnosis:  FTT, chronic combined systolic/diastolic HF, COVID -19  PMH: ESRD on HD, COPD, HTN, migraine, lupus, seziures, anemia, cardiac arrest hx, cellulitis, cerebral vasculitis, hx of suicide attempt, TB, TIA    PT Comments    Pt admitted with above diagnosis. Pt was able to ambulate without device but needs min assist with HHA for steady gait as she is unsteady at times. Pt progressing distance.  Left hand swollen and MD came in and is going to order tests for pt. Will continue to follow acutely.  Pt currently with functional limitations due to balance and endurance deficits. Pt will benefit from skilled PT to increase their independence and safety with mobility to allow discharge to the venue listed below.      Recommendations for follow up therapy are one component of a multi-disciplinary discharge planning process, led by the attending physician.  Recommendations may be updated based on patient status, additional functional criteria and insurance authorization.  Follow Up Recommendations  Skilled nursing-short term rehab (<3 hours/day)     Assistance Recommended at Discharge Intermittent Supervision/Assistance  Patient can return home with the following A little help with walking and/or transfers;A little help with bathing/dressing/bathroom;Assistance with cooking/housework;Assist for transportation;Help with stairs or ramp for entrance   Equipment Recommendations  Rolling walker (2 wheels);BSC/3in1    Recommendations for Other Services       Precautions / Restrictions Precautions Precautions: Fall Precaution Comments: monitor vitals Restrictions Weight Bearing Restrictions: No     Mobility  Bed Mobility Overal bed  mobility: Needs Assistance Bed Mobility: Rolling, Sidelying to Sit, Sit to Sidelying Rolling: Min assist Sidelying to sit: Min assist Supine to sit: Min assist     General bed mobility comments: Needed only a little assist    Transfers Overall transfer level: Needs assistance Equipment used: 1 person hand held assist Transfers: Sit to/from Stand Sit to Stand: Min assist           General transfer comment: steadying assist, cues for safety    Ambulation/Gait Ambulation/Gait assistance: Min assist Gait Distance (Feet): 220 Feet Assistive device: 1 person hand held assist Gait Pattern/deviations: Step-to pattern, Decreased stride length, Shuffle, Drifts right/left Gait velocity: reduced Gait velocity interpretation: <1.31 ft/sec, indicative of household ambulator   General Gait Details: Pt was able to ambulate but could not use RW due to left UE pain. Pt held onto PT with right Hand and was able to ambulate incr distance with unsteadiness on feet at times with definite need for HHA.  Fatigued at end of walk.   Stairs             Wheelchair Mobility    Modified Rankin (Stroke Patients Only)       Balance Overall balance assessment: Needs assistance Sitting-balance support: No upper extremity supported, Feet supported Sitting balance-Leahy Scale: Fair Sitting balance - Comments: Static sitting EOB with min guard for safety   Standing balance support: Single extremity supported Standing balance-Leahy Scale: Poor Standing balance comment: relies on at least 1 UE support.                            Cognition Arousal/Alertness: Awake/alert Behavior During Therapy: Impulsive Overall Cognitive Status: Impaired/Different from  baseline Area of Impairment: Safety/judgement                         Safety/Judgement: Decreased awareness of safety     General Comments: Pt following commands without difficulty. Pt continues to  demonstrate  impulsivity.        Exercises General Exercises - Lower Extremity Long Arc Quad: AROM, Both, 10 reps, Seated Heel Slides: AAROM, PROM, Both, 5 reps, Supine    General Comments        Pertinent Vitals/Pain Pain Assessment Pain Assessment: Faces Faces Pain Scale: Hurts whole lot Breathing: normal Negative Vocalization: none Facial Expression: smiling or inexpressive Body Language: relaxed Consolability: no need to console PAINAD Score: 0 Pain Location: left hand Pain Descriptors / Indicators: Grimacing, Guarding Pain Intervention(s): Limited activity within patient's tolerance, Monitored during session, Repositioned    Home Living                          Prior Function            PT Goals (current goals can now be found in the care plan section) Progress towards PT goals: Progressing toward goals    Frequency    Min 2X/week      PT Plan Current plan remains appropriate    Co-evaluation              AM-PAC PT "6 Clicks" Mobility   Outcome Measure  Help needed turning from your back to your side while in a flat bed without using bedrails?: A Lot Help needed moving from lying on your back to sitting on the side of a flat bed without using bedrails?: A Lot Help needed moving to and from a bed to a chair (including a wheelchair)?: A Lot Help needed standing up from a chair using your arms (e.g., wheelchair or bedside chair)?: A Little Help needed to walk in hospital room?: A Little Help needed climbing 3-5 steps with a railing? : Total 6 Click Score: 13    End of Session Equipment Utilized During Treatment: Gait belt Activity Tolerance: Patient limited by fatigue;Patient limited by pain Patient left: with call bell/phone within reach;in bed (sitting EOB eating breakfast) Nurse Communication: Mobility status PT Visit Diagnosis: Unsteadiness on feet (R26.81);Other abnormalities of gait and mobility (R26.89);Muscle weakness (generalized)  (M62.81);History of falling (Z91.81);Difficulty in walking, not elsewhere classified (R26.2);Pain Pain - part of body:  (abdomen)     Time: 1191-4782 PT Time Calculation (min) (ACUTE ONLY): 26 min  Charges:  $Gait Training: 8-22 mins $Therapeutic Exercise: 8-22 mins                     Dasani Thurlow M,PT Acute Rehab Services [phone] (pager)    Bevelyn Buckles 05/26/2021, 11:26 AM

## 2021-05-26 NOTE — Progress Notes (Signed)
Nephrology Follow-Up Consult note   Assessment/Recommendations: Kathleen [name] is a/an 61 y.o. female with a past medical history significant for ESRD, admitted for COVID, strep pneumo bacteremia.     OP HD: [name]   3h 28min  50.8kg 350/1.5  2/2 bath F160  LUE AVG    - last post weight 1/3 was 48.2 kg  - sensipar 60 mg three times a week  - hectorol 6 mcg IV tiw  - venofer 100 mg weekly  - mircera 75 mcg q 2wks last 12/20, held 1/3 due to [place]  ESRD: HD on TTS schedule. Plan for dialysis today  Strep pneumo bacteremia: On IV antibiotics.  Followed by ID.  TEE negative for endocarditis  COVID-pneumonia: Continue treatment per primary team  EDW/BP: Under dry weight; recently 48kg.  Appears euvolemic.  Continue to remove fluid as able.  GI bleeding: intermittent hematochezia. Outpatient endoscopic eval. CTM per primary  Anemia of CKD: Hgb 9.2, no esa ordered currently. CTM. No iron given infection  Secondary hyperparathyroidism: Continue Hectorol and Sensipar.  Phos has been low. Hold binder  SLE: on prednisone per primary   Recommendations conveyed to primary service.    [facility] 05/26/2021 9:35 AM  ___________________________________________________________  CC: ESRD, COVID, strep pneumo bacteremia  Interval History/Subjective: Due to the COVID pandemic, in attempts to limit transmission and over-utilization of resources (e.g. PPE), the patient was seen virtually today by means of chart review, discussion with staff, and virtual discussion with patient as needed.  TEE negative for endocarditis. Potassium 6 this morning and given lokelma. Plan for dialysis today.  Medications:  Current Facility-Administered Medications  Medication Dose Route Frequency Provider Last Rate Last Admin   0.9 %  sodium chloride infusion  250 mL Intravenous PRN [name], [name], MD       acetaminophen (TYLENOL) tablet 650 mg  650 mg Oral Q6H PRN  Geralynn Rile, MD   650 mg at 05/20/21 0962   Or   acetaminophen (TYLENOL) suppository 650 mg  650 mg Rectal Q6H PRN Geralynn Rile, MD       amLODipine (NORVASC) tablet 10 mg  10 mg Oral Daily [name], [name], MD   10 mg at 05/25/21 1059   ceFAZolin (ANCEF) IVPB 2g/100 mL premix  2 g Intravenous Q T,Th,Sat-1800 Laurice Record, MD       Chlorhexidine Gluconate Cloth 2 % PADS 6 each  6 each Topical Q0600 [name], [name], MD   6 each at 05/26/21 0809   cinacalcet (SENSIPAR) tablet 60 mg  60 mg Oral Q T,Th,Sa-HD Geralynn Rile, MD   60 mg at 05/23/21 1339   doxercalciferol (HECTOROL) injection 6 mcg  6 mcg Intravenous Q T,Th,Sa-HD Geralynn Rile, MD       famotidine (PEPCID) tablet 20 mg  20 mg Oral Daily [name], [name], MD   20 mg at 05/25/21 0900   feeding supplement (NEPRO CARB STEADY) liquid 237 mL  237 mL Oral TID BM Geralynn Rile, MD   237 mL at 05/25/21 2115   heparin injection 5,000 Units  5,000 Units Subcutaneous Q8H Geralynn Rile, MD   5,000 Units at 05/26/21 0502   hydrALAZINE (APRESOLINE) injection 10 mg  10 mg Intravenous Q6H PRN Geralynn Rile, MD   10 mg at 05/25/21 1101   multivitamin (RENA-VIT) tablet 1 tablet  1 tablet Oral QHS Geralynn Rile, MD   1 tablet at 05/25/21 2115  naloxone (NARCAN) injection 0.4 mg  0.4 mg Intravenous PRN [name], [name], MD       nicotine (NICODERM CQ - dosed in mg/24 hours) patch 14 mg  14 mg Transdermal Daily [name], [name], MD   14 mg at 05/25/21 0900   oxyCODONE (Oxy IR/ROXICODONE) immediate release tablet 10 mg  10 mg Oral Q4H PRN Geralynn Rile, MD   10 mg at 05/25/21 2115   predniSONE (DELTASONE) tablet 40 mg  40 mg Oral Q breakfast Thurnell Lose, MD   40 mg at 05/26/21 0803   sodium chloride flush (NS) 0.9 % injection 3 mL  3 mL Intravenous Q12H Geralynn Rile, MD   3 mL at 05/25/21 2115   sodium chloride flush (NS) 0.9 % injection 3 mL  3  mL Intravenous PRN Geralynn Rile, MD          Review of Systems: 10 systems reviewed and negative except per interval history/subjective  Physical Exam: Vitals:   05/26/21 0421 05/26/21 0821  BP: 140/86 (!) 146/98  Pulse: 97 82  Resp: 18 18  Temp: 98.1 F (36.7 C) 97.7 F (36.5 C)  SpO2: 97% 98%   Total I/O In: 240 [P.O.:240] Out: -   Intake/Output Summary (Last 24 hours) at 05/26/2021 0935 Last data filed at 05/26/2021 2694 Gross per 24 hour  Intake 760 ml  Output --  Net 760 ml   Deferred today   Test Results I personally reviewed new and old clinical labs and radiology tests Lab Results  Component Value Date   NA 132 (L) 05/26/2021   K 6.0 (H) 05/26/2021   CL 97 (L) 05/26/2021   CO2 23 05/26/2021   BUN 73 (H) 05/26/2021   CREATININE 7.65 (H) 05/26/2021   CALCIUM 8.5 (L) 05/26/2021   ALBUMIN 1.8 (L) 05/26/2021   PHOS 2.3 (L) 05/24/2021

## 2021-05-26 NOTE — Progress Notes (Addendum)
PROGRESS NOTE        PATIENT DETAILS Name: Kathleen [name] Age: 61 y.o. Sex: female Date of Birth: 1960/11/28 Admit Date: 05/15/2021 Admitting Physician Orma Flaming, MD YKZ:LDJTT, Beverly Gust, NP  Brief Narrative:  Patient is a 61 y.o. female with history of ESRD on HD TTS, HFrEF, SLE, anemia due to CKD, HTN-who presented with at least 1 week history of worsening weakness/lethargy, intermittent diarrhea, hematochezia-she had missed her last 3 HD sessions prior to hospitalization due to weakness/lethargy.  She was found to have COVID-19 infection-and thought to have uremic encephalopathy in the setting of missed HD.  She stabilized with Remdesivir and hemodialysis.  Unfortunately-Hospital course was complicated by severe arthralgias mostly in her left wrist/spine-low-grade fever-she was subsequently found to have streptococcal pneumonia bacteremia.  See below for further details.  Significant Hospital events 1/06>> admit with diarrhea/intermittent hematochezia from COVID-uremic encephalopathy from missed HD. 1/10>> developed arthralgias-mostly in her left wrist.Prednisone ordered 1/11>> low-grade fever-worsening left wrist arthralgia-CT left wrist negative for effusion.   1/11>> blood culture positive Streptococcus pneumoniae-started on IV Rocephin.  Subjective:  Patient in bed, appears comfortable, denies any headache, no fever, no chest pain or pressure, no shortness of breath , no abdominal pain. No new focal weakness. Back and L. Wrist pain is better.  Objective: Vitals: Blood pressure (!) 146/98, pulse 82, temperature 97.7 F (36.5 C), temperature source Oral, resp. rate 18, height 5\' 4"  (1.626 m), weight 48 kg, SpO2 98 %.   Exam:  Awake Alert, No new F.N deficits, Normal affect [place].AT,PERRAL Supple Neck, No JVD,   Symmetrical Chest wall movement, Good air movement bilaterally, CTAB RRR,No Gallops, Rubs or new Murmurs,  +ve B.Sounds, Abd Soft, No  tenderness,   No Cyanosis, Clubbing or edema    Assessment/Plan:  COVID-19 infection: Improved-diarrhea has resolved-no PNA on CXR-completed 3 days of Remdesivir   Diarrhea with intermittent hematochezia: Diarrhea due to COVID-19 infection-suspect hematochezia was in the setting of some mucosal sloughing.  Hematochezia and diarrhea have resolved.  Given significant debility/deconditioning/stable hemoglobin-suspect we can postpone endoscopic evaluation to the outpatient setting.  Discussed this with patient-she agrees.    Acute metabolic encephalopathy: Resolved.  Due to uremia in the setting of missed HD with some contribution from COVID-19.    Streptococcal pneumonia with bacteremia: Developed low-grade fever/left wrist arthralgia/back pain on 1/10-1/11-blood cultures are positive for Streptococcus pneumonia.  No PNA on CXR.  TTE without vegetations.  MRI with suspicious septic arthritis at L4-L5 level-Per IR-this is not amenable to drainage.  TEE stable on 05/25/2021.  Currently on IV penicillin per ID.  Repeat blood cultures drawn on 05/21/2021 are negative so far.  Case discussed with neurosurgeon Dr. Kathyrn Sheriff including the MRI findings, does not think these findings are of any significance.  No further work-up for this issue of nonspecific MRI changes.   Left wrist arthralgia: Slowly improving after initiation of steroids on 1/10 and IV antibiotics on 1/11.  Although she has a history of lupus with positive ANA-dsDNA level/complement levels are not suggestive of lupus flare.  Pain improved on steroids, CT left wrist unremarkable MRI ordered by ID on 05/26/2021, also will add uric acid to a.m. lab.  No effusion on exam.  SLE: Claims to have a longstanding history of lupus-followed by rheumatology at [facility].  Per patient-her Plaquenil was discontinued by her nephrologist  several months ago-I currently on oral prednisone 40 mg daily, plan is post discharge follow-up with her  rheumatologist at [facility].  ESRD: Nephrology following-we will defer hemodialysis and electrolyte management to nephrology service.  Chronic systolic heart failure (EF 45-50% on 03/13/2021): Volume status is stable.  Watch closely.  HTN: BP now stable will add Norvasc.  Prolonged QTC: Appears to have develops some amount of QTC elongation over the past few months-repeat EKG on 1/11 and 1/13 with stable/normalized QTC.    We will attempt to keep K> 4, and Mg>2.  Monitor EKGs periodically-continue telemetry monitoring.    Tobacco abuse: Continue transdermal nicotine  LLL Nodule - outpt Pulm follow up, within a month of DC.  Deconditioning/debility: Likely due to COVID-19 infection-and worsening uremia from missed HD.  Hopefully will continue to improve with further hemodialysis-improvement in underlying COVID-19 infection.  Evaluated by PT services-felt to require SNF on discharge.  Social worker following.   BMI Estimated body mass index is 18.16 kg/m as calculated from the following:   Height as of this encounter: 5\' 4"  (1.626 m).   Weight as of this encounter: 48 kg.    Procedures: None Consults: ID, nephrology.  Neurosurgeon Dr. Kathyrn Sheriff on 05/24/2021 over the phone. DVT Prophylaxis: SQ heparin Code Status:Full code  Family Communication: None at bedside  Disposition Plan: Status is: Inpatient  Severe debility/deconditioning-now with worsening encephalopathy due to uremia-needs hemodialysis today.  On IV Remdesivir for COVID.  SNF planned on discharge.  Diet: Diet Order             Diet renal/carb modified with fluid restriction Diet-HS Snack? Nothing; Fluid restriction: 1200 mL Fluid; Room service appropriate? Yes; Fluid consistency: Thin  Diet effective now                    MEDICATIONS: Scheduled Meds:  amLODipine  10 mg Oral Daily   Chlorhexidine Gluconate Cloth  6 each Topical Q0600   cinacalcet  60 mg Oral Q T,Th,Sa-HD   doxercalciferol  6 mcg  Intravenous Q T,Th,Sa-HD   famotidine  20 mg Oral Daily   feeding supplement (NEPRO CARB STEADY)  237 mL Oral TID BM   heparin injection (subcutaneous)  5,000 Units Subcutaneous Q8H   multivitamin  1 tablet Oral QHS   nicotine  14 mg Transdermal Daily   predniSONE  40 mg Oral Q breakfast   sodium chloride flush  3 mL Intravenous Q12H   Continuous Infusions:  sodium chloride      ceFAZolin (ANCEF) IV     PRN Meds:.sodium chloride, acetaminophen **OR** acetaminophen, hydrALAZINE, naLOXone (NARCAN)  injection, oxyCODONE, sodium chloride flush   I have personally reviewed following labs and imaging studies  LABORATORY DATA:  Recent Labs  Lab 05/22/21 0549 05/23/21 0410 05/24/21 0119 05/25/21 0029 05/26/21 0252  WBC 18.5* 12.0* 11.2* 12.5* 12.1*  HGB 10.3* 9.9* 8.9* 10.0* 9.2*  HCT 29.9* 30.0* 27.7* 31.7* 28.3*  PLT 199 178 214 235 254  MCV 84.7 87.0 86.6 89.5 87.6  MCH 29.2 28.7 27.8 28.2 28.5  MCHC 34.4 33.0 32.1 31.5 32.5  RDW 18.8* 19.3* 19.1* 19.3* 19.3*  LYMPHSABS  --   --   --  1.2 0.8  MONOABS  --   --   --  1.0 0.6  EOSABS  --   --   --  0.0 0.0  BASOSABS  --   --   --  0.0 0.0    Recent Labs  Lab 05/21/21 0222  05/22/21 0549 05/23/21 0410 05/24/21 0119 05/24/21 0728 05/25/21 0029 05/26/21 0252  NA 131* 129* 135 131*  --  133* 132*  K 3.8 4.3 4.3 4.6  --  4.3 6.0*  CL 89* 87* 94* 93*  --  95* 97*  CO2 26 27 26 27   --  30 23  GLUCOSE 110* 104* 115* 98  --  100* 94  BUN 55* 88* 65* 91*  --  51* 73*  CREATININE 9.42* 11.24* 7.04* 8.60*  --  5.73* 7.65*  CALCIUM 7.3* 8.0* 8.8* 8.7*  --  9.0 8.5*  AST  --   --   --   --   --  18 15  ALT  --   --   --   --   --  12 11  ALKPHOS  --   --   --   --   --  70 63  BILITOT  --   --   --   --   --  0.4 0.4  ALBUMIN 2.3* 2.2* 2.1* 1.8*  --  1.9* 1.8*  MG 2.4  --  2.5* 2.8*  --  2.5* 2.5*  CRP  --   --   --   --   --  7.1* 9.6*  PROCALCITON  --   --   --   --  6.99 4.20  --     RADIOLOGY STUDIES/RESULTS: ECHO  TEE  Result Date: 05/25/2021    TRANSESOPHOGEAL ECHO REPORT   Patient Name:   MASAE LUKACS Date of Exam: 05/25/2021 Medical Rec #:  [id number]          Height:       64.0 in Accession #:    [id number]         Weight:       105.8 lb Date of Birth:  13-Aug-1960          BSA:          1.493 m Patient Age:    60 years           BP:           121/71 mmHg Patient Gender: F                  HR:           120 bpm. Exam Location:  Inpatient Procedure: Transesophageal Echo, Cardiac Doppler, Color Doppler and Saline            Contrast Bubble Study Indications:     Bacteremia  History:         Patient has prior history of Echocardiogram examinations, most                  recent 05/21/2021.  Sonographer:     Clayton Lefort RDCS (AE) Referring Phys:  [id number] [facility] Diagnosing Phys: Eleonore Chiquito MD PROCEDURE: After discussion of the risks and benefits of a TEE, an informed consent was obtained from the patient. TEE procedure time was 12 minutes. The transesophogeal probe was passed without difficulty through the esophogus of the patient. Imaged were obtained with the patient in a left lateral decubitus position. Local oropharyngeal anesthetic was provided with Cetacaine. Sedation performed by different physician. The patient was monitored while under deep sedation. Anesthestetic sedation was provided intravenously by Anesthesiology: 140mg  of Propofol. Image quality was excellent. The patient's vital signs; including heart rate, blood pressure, and oxygen saturation; remained stable throughout the procedure. The patient developed no  complications during the procedure. IMPRESSIONS  1. Left ventricular ejection fraction, by estimation, is 60 to 65%. The left ventricle has normal function. The left ventricle has no regional wall motion abnormalities.  2. Right ventricular systolic function is normal. The right ventricular size is normal.  3. No left atrial/left atrial appendage thrombus was detected. The LAA emptying  velocity was 69 cm/s.  4. The mitral valve is normal in structure. Trivial mitral valve regurgitation. No evidence of mitral stenosis.  5. The aortic valve is tricuspid. There is mild calcification of the aortic valve. Aortic valve regurgitation is mild. Aortic valve sclerosis is present, with no evidence of aortic valve stenosis.  6. Aortic dilatation noted. There is moderate dilatation of the aortic root, measuring 44 mm. There is mild (Grade II) layered plaque involving the descending aorta.  7. Agitated saline contrast bubble study was negative, with no evidence of any interatrial shunt. Conclusion(s)/Recommendation(s): No evidence of vegetation/infective endocarditis on this transesophageael echocardiogram. FINDINGS  Left Ventricle: Left ventricular ejection fraction, by estimation, is 60 to 65%. The left ventricle has normal function. The left ventricle has no regional wall motion abnormalities. The left ventricular internal cavity size was normal in size. Right Ventricle: The right ventricular size is normal. No increase in right ventricular wall thickness. Right ventricular systolic function is normal. Left Atrium: Left atrial size was normal in size. No left atrial/left atrial appendage thrombus was detected. The LAA emptying velocity was 69 cm/s. Right Atrium: Right atrial size was normal in size. Pericardium: There is no evidence of pericardial effusion. Mitral Valve: The mitral valve is normal in structure. Trivial mitral valve regurgitation. No evidence of mitral valve stenosis. There is no evidence of mitral valve vegetation. Tricuspid Valve: The tricuspid valve is normal in structure. Tricuspid valve regurgitation is trivial. No evidence of tricuspid stenosis. There is no evidence of tricuspid valve vegetation. Aortic Valve: The aortic valve is tricuspid. There is mild calcification of the aortic valve. Aortic valve regurgitation is mild. Aortic valve sclerosis is present, with no evidence of aortic  valve stenosis. There is no evidence of aortic valve vegetation. Pulmonic Valve: The pulmonic valve was grossly normal. Pulmonic valve regurgitation is not visualized. No evidence of pulmonic stenosis. There is no evidence of pulmonic valve vegetation. Aorta: Aortic dilatation noted. There is moderate dilatation of the aortic root, measuring 44 mm. There is mild (Grade II) layered plaque involving the descending aorta. Venous: The left upper pulmonary vein, left lower pulmonary vein, right upper pulmonary vein and right lower pulmonary vein are normal. IAS/Shunts: The atrial septum is grossly normal. Agitated saline contrast was given intravenously to evaluate for intracardiac shunting. Agitated saline contrast bubble study was negative, with no evidence of any interatrial shunt.   AORTA Ao Root diam: 4.40 cm Ao Asc diam:  3.85 cm Eleonore Chiquito MD Electronically signed by Eleonore Chiquito MD Signature Date/Time: 05/25/2021/3:10:42 PM    Final      LOS: 10 days   Signature  Lala Lund M.D on 05/26/2021 at 9:52 AM   -  To page go to [email or web address]

## 2021-05-27 ENCOUNTER — Encounter (HOSPITAL_COMMUNITY): Payer: Self-pay | Admitting: Family Medicine

## 2021-05-27 ENCOUNTER — Encounter (HOSPITAL_COMMUNITY)
Admission: EM | Disposition: A | Discharge status: Home / Self Care | Payer: Self-pay | Source: Home / Self Care | Attending: Internal Medicine

## 2021-05-27 ENCOUNTER — Inpatient Hospital Stay (HOSPITAL_COMMUNITY): Payer: Medicare Other

## 2021-05-27 ENCOUNTER — Inpatient Hospital Stay (HOSPITAL_COMMUNITY): Payer: Medicare Other | Admitting: Certified Registered Nurse Anesthetist

## 2021-05-27 HISTORY — PX: I & D EXTREMITY: SHX5045

## 2021-05-27 LAB — PROCALCITONIN: Procalcitonin: 1.79 ng/mL

## 2021-05-27 LAB — CBC WITH DIFFERENTIAL/PLATELET
Abs Immature Granulocytes: 0.1 10*3/uL — ABNORMAL HIGH (ref 0.00–0.07)
Basophils Absolute: 0 10*3/uL (ref 0.0–0.1)
Basophils Relative: 0 %
Eosinophils Absolute: 0 10*3/uL (ref 0.0–0.5)
Eosinophils Relative: 0 %
HCT: 28.3 % — ABNORMAL LOW (ref 36.0–46.0)
Hemoglobin: 9.3 g/dL — ABNORMAL LOW (ref 12.0–15.0)
Immature Granulocytes: 1 %
Lymphocytes Relative: 15 %
Lymphs Abs: 1.4 10*3/uL (ref 0.7–4.0)
MCH: 28.6 pg (ref 26.0–34.0)
MCHC: 32.9 g/dL (ref 30.0–36.0)
MCV: 87.1 fL (ref 80.0–100.0)
Monocytes Absolute: 0.8 10*3/uL (ref 0.1–1.0)
Monocytes Relative: 8 %
Neutro Abs: 6.9 10*3/uL (ref 1.7–7.7)
Neutrophils Relative %: 76 %
Platelets: 288 10*3/uL (ref 150–400)
RBC: 3.25 MIL/uL — ABNORMAL LOW (ref 3.87–5.11)
RDW: 19.2 % — ABNORMAL HIGH (ref 11.5–15.5)
WBC: 9.3 10*3/uL (ref 4.0–10.5)
nRBC: 0 % (ref 0.0–0.2)

## 2021-05-27 LAB — COMPREHENSIVE METABOLIC PANEL
ALT: 12 U/L (ref 0–44)
AST: 20 U/L (ref 15–41)
Albumin: 1.9 g/dL — ABNORMAL LOW (ref 3.5–5.0)
Alkaline Phosphatase: 58 U/L (ref 38–126)
Anion gap: 11 (ref 5–15)
BUN: 37 mg/dL — ABNORMAL HIGH (ref 6–20)
CO2: 28 mmol/L (ref 22–32)
Calcium: 8.1 mg/dL — ABNORMAL LOW (ref 8.9–10.3)
Chloride: 94 mmol/L — ABNORMAL LOW (ref 98–111)
Creatinine, Ser: 4.92 mg/dL — ABNORMAL HIGH (ref 0.44–1.00)
GFR, Estimated: 10 mL/min — ABNORMAL LOW (ref >60)
Glucose, Bld: 100 mg/dL — ABNORMAL HIGH (ref 70–99)
Potassium: 3.6 mmol/L (ref 3.5–5.1)
Sodium: 133 mmol/L — ABNORMAL LOW (ref 135–145)
Total Bilirubin: 0.4 mg/dL (ref 0.3–1.2)
Total Protein: 6 g/dL — ABNORMAL LOW (ref 6.5–8.1)

## 2021-05-27 LAB — C-REACTIVE PROTEIN: CRP: 7 mg/dL — ABNORMAL HIGH (ref <1.0)

## 2021-05-27 LAB — MAGNESIUM: Magnesium: 2.2 mg/dL (ref 1.7–2.4)

## 2021-05-27 SURGERY — IRRIGATION AND DEBRIDEMENT EXTREMITY
Anesthesia: General | Laterality: Left

## 2021-05-27 MED ORDER — HYDROMORPHONE HCL 1 MG/ML IJ SOLN
INTRAMUSCULAR | Status: AC
  Filled 2021-05-27: qty 1

## 2021-05-27 MED ORDER — MIDAZOLAM HCL 2 MG/2ML IJ SOLN: 0.5000 mg | Freq: Once | INTRAMUSCULAR | Status: DC | PRN

## 2021-05-27 MED ORDER — LIDOCAINE 2% (20 MG/ML) 5 ML SYRINGE
INTRAMUSCULAR | Status: AC
  Filled 2021-05-27: qty 5

## 2021-05-27 MED ORDER — OXYCODONE HCL 5 MG/5ML PO SOLN: 5.0000 mg | Freq: Once | ORAL | Status: DC | PRN

## 2021-05-27 MED ORDER — POVIDONE-IODINE 10 % EX SWAB: 2.0000 "application " | Freq: Once | CUTANEOUS | Status: DC

## 2021-05-27 MED ORDER — FENTANYL CITRATE (PF) 250 MCG/5ML IJ SOLN
INTRAMUSCULAR | Status: DC | PRN
Start: 2021-05-27 — End: 2021-05-27
  Administered 2021-05-27: 100 ug via INTRAVENOUS
  Administered 2021-05-27: 50 ug via INTRAVENOUS

## 2021-05-27 MED ORDER — ONDANSETRON HCL 4 MG/2ML IJ SOLN
INTRAMUSCULAR | Status: DC | PRN
  Administered 2021-05-27: 4 mg via INTRAVENOUS

## 2021-05-27 MED ORDER — PHENYLEPHRINE 40 MCG/ML (10ML) SYRINGE FOR IV PUSH (FOR BLOOD PRESSURE SUPPORT)
PREFILLED_SYRINGE | INTRAVENOUS | Status: AC
  Filled 2021-05-27: qty 10

## 2021-05-27 MED ORDER — SODIUM CHLORIDE 0.9 % IV SOLN: INTRAVENOUS | Status: DC | PRN

## 2021-05-27 MED ORDER — CEFAZOLIN SODIUM-DEXTROSE 2-4 GM/100ML-% IV SOLN
2.0000 g | Freq: Once | INTRAVENOUS | Status: AC
  Administered 2021-05-27: 2 g via INTRAVENOUS
  Filled 2021-05-27: qty 100

## 2021-05-27 MED ORDER — CARVEDILOL 3.125 MG PO TABS
3.1250 mg | ORAL_TABLET | Freq: Two times a day (BID) | ORAL | Status: DC
  Administered 2021-05-28 – 2021-06-03 (×12): 3.125 mg via ORAL
  Filled 2021-05-27 (×15): qty 1

## 2021-05-27 MED ORDER — ACETAMINOPHEN 10 MG/ML IV SOLN
INTRAVENOUS | Status: AC
  Filled 2021-05-27: qty 100

## 2021-05-27 MED ORDER — EPHEDRINE 5 MG/ML INJ
INTRAVENOUS | Status: AC
  Filled 2021-05-27: qty 5

## 2021-05-27 MED ORDER — CHLORHEXIDINE GLUCONATE 4 % EX LIQD
60.0000 mL | Freq: Once | CUTANEOUS | Status: DC
  Filled 2021-05-27: qty 60

## 2021-05-27 MED ORDER — PHENYLEPHRINE HCL (PRESSORS) 10 MG/ML IV SOLN
INTRAVENOUS | Status: DC | PRN
  Administered 2021-05-27: 40 ug via INTRAVENOUS

## 2021-05-27 MED ORDER — LIDOCAINE 2% (20 MG/ML) 5 ML SYRINGE
INTRAMUSCULAR | Status: DC | PRN
  Administered 2021-05-27: 50 mg via INTRAVENOUS

## 2021-05-27 MED ORDER — FENTANYL CITRATE (PF) 250 MCG/5ML IJ SOLN
INTRAMUSCULAR | Status: AC
  Filled 2021-05-27: qty 5

## 2021-05-27 MED ORDER — SUCCINYLCHOLINE CHLORIDE 200 MG/10ML IV SOSY
PREFILLED_SYRINGE | INTRAVENOUS | Status: DC | PRN
Start: 2021-05-27 — End: 2021-05-27
  Administered 2021-05-27: 60 mg via INTRAVENOUS

## 2021-05-27 MED ORDER — SUCCINYLCHOLINE CHLORIDE 200 MG/10ML IV SOSY
PREFILLED_SYRINGE | INTRAVENOUS | Status: AC
  Filled 2021-05-27: qty 10

## 2021-05-27 MED ORDER — DEXAMETHASONE SODIUM PHOSPHATE 10 MG/ML IJ SOLN
INTRAMUSCULAR | Status: DC | PRN
  Administered 2021-05-27: 10 mg via INTRAVENOUS

## 2021-05-27 MED ORDER — HYDROMORPHONE HCL 1 MG/ML IJ SOLN
0.2500 mg | INTRAMUSCULAR | Status: DC | PRN
  Administered 2021-05-27 (×2): 0.5 mg via INTRAVENOUS

## 2021-05-27 MED ORDER — DEXAMETHASONE SODIUM PHOSPHATE 10 MG/ML IJ SOLN
INTRAMUSCULAR | Status: AC
  Filled 2021-05-27: qty 1

## 2021-05-27 MED ORDER — ROCURONIUM BROMIDE 10 MG/ML (PF) SYRINGE
PREFILLED_SYRINGE | INTRAVENOUS | Status: AC
  Filled 2021-05-27: qty 10

## 2021-05-27 MED ORDER — 0.9 % SODIUM CHLORIDE (POUR BTL) OPTIME
TOPICAL | Status: DC | PRN
  Administered 2021-05-27: 2000 mL

## 2021-05-27 MED ORDER — PROPOFOL 10 MG/ML IV BOLUS
INTRAVENOUS | Status: AC
  Filled 2021-05-27: qty 20

## 2021-05-27 MED ORDER — ONDANSETRON HCL 4 MG/2ML IJ SOLN
INTRAMUSCULAR | Status: AC
  Filled 2021-05-27: qty 2

## 2021-05-27 MED ORDER — ACETAMINOPHEN 10 MG/ML IV SOLN
INTRAVENOUS | Status: DC | PRN
Start: 2021-05-27 — End: 2021-05-27
  Administered 2021-05-27: 1000 mg via INTRAVENOUS

## 2021-05-27 MED ORDER — SCOPOLAMINE 1 MG/3DAYS TD PT72
MEDICATED_PATCH | TRANSDERMAL | Status: AC
  Filled 2021-05-27: qty 1

## 2021-05-27 MED ORDER — OXYCODONE HCL 5 MG PO TABS: 5.0000 mg | ORAL_TABLET | Freq: Once | ORAL | Status: DC | PRN

## 2021-05-27 MED ORDER — PROPOFOL 10 MG/ML IV BOLUS
INTRAVENOUS | Status: DC | PRN
  Administered 2021-05-27: 70 mg via INTRAVENOUS

## 2021-05-27 SURGICAL SUPPLY — 57 items
BAG COUNTER SPONGE SURGICOUNT (BAG) ×2 IMPLANT
BNDG COHESIVE 1X5 TAN STRL LF (GAUZE/BANDAGES/DRESSINGS) IMPLANT
BNDG CONFORM 2 STRL LF (GAUZE/BANDAGES/DRESSINGS) IMPLANT
BNDG ELASTIC 3X5.8 VLCR STR LF (GAUZE/BANDAGES/DRESSINGS) ×2 IMPLANT
BNDG ELASTIC 4X5.8 VLCR STR LF (GAUZE/BANDAGES/DRESSINGS) ×2 IMPLANT
BNDG ESMARK 4X9 LF (GAUZE/BANDAGES/DRESSINGS) ×2 IMPLANT
BNDG GAUZE ELAST 4 BULKY (GAUZE/BANDAGES/DRESSINGS) ×2 IMPLANT
CORD BIPOLAR FORCEPS 12FT (ELECTRODE) ×2 IMPLANT
COVER SURGICAL LIGHT HANDLE (MISCELLANEOUS) ×2 IMPLANT
CUFF TOURN SGL QUICK 18X4 (TOURNIQUET CUFF) ×2 IMPLANT
CUFF TOURN SGL QUICK 24 (TOURNIQUET CUFF)
CUFF TRNQT CYL 24X4X16.5-23 (TOURNIQUET CUFF) IMPLANT
DRAIN PENROSE 1/4X12 LTX STRL (WOUND CARE) IMPLANT
DRAPE SURG 17X23 STRL (DRAPES) ×2 IMPLANT
DRSG ADAPTIC 3X8 NADH LF (GAUZE/BANDAGES/DRESSINGS) ×2 IMPLANT
DRSG EMULSION OIL 3X3 NADH (GAUZE/BANDAGES/DRESSINGS) ×1 IMPLANT
ELECT REM PT RETURN 9FT ADLT (ELECTROSURGICAL)
ELECTRODE REM PT RTRN 9FT ADLT (ELECTROSURGICAL) IMPLANT
GAUZE SPONGE 4X4 12PLY STRL (GAUZE/BANDAGES/DRESSINGS) ×2 IMPLANT
GAUZE XEROFORM 1X8 LF (GAUZE/BANDAGES/DRESSINGS) ×2 IMPLANT
GAUZE XEROFORM 5X9 LF (GAUZE/BANDAGES/DRESSINGS) IMPLANT
GLOVE SURG ORTHO LTX SZ8 (GLOVE) ×2 IMPLANT
GLOVE SURG UNDER POLY LF SZ8.5 (GLOVE) ×2 IMPLANT
GOWN STRL REUS W/ TWL LRG LVL3 (GOWN DISPOSABLE) ×3 IMPLANT
GOWN STRL REUS W/ TWL XL LVL3 (GOWN DISPOSABLE) ×1 IMPLANT
GOWN STRL REUS W/TWL LRG LVL3 (GOWN DISPOSABLE) ×3
GOWN STRL REUS W/TWL XL LVL3 (GOWN DISPOSABLE) ×1
HANDPIECE INTERPULSE COAX TIP (DISPOSABLE)
KIT BASIN OR (CUSTOM PROCEDURE TRAY) ×2 IMPLANT
KIT TURNOVER KIT B (KITS) ×2 IMPLANT
MANIFOLD NEPTUNE II (INSTRUMENTS) ×2 IMPLANT
NDL HYPO 25GX1X1/2 BEV (NEEDLE) IMPLANT
NEEDLE HYPO 25GX1X1/2 BEV (NEEDLE) IMPLANT
NS IRRIG 1000ML POUR BTL (IV SOLUTION) ×2 IMPLANT
PACK ORTHO EXTREMITY (CUSTOM PROCEDURE TRAY) ×2 IMPLANT
PAD ARMBOARD 7.5X6 YLW CONV (MISCELLANEOUS) ×4 IMPLANT
PAD CAST 3X4 CTTN HI CHSV (CAST SUPPLIES) IMPLANT
PAD CAST 4YDX4 CTTN HI CHSV (CAST SUPPLIES) ×1 IMPLANT
PADDING CAST COTTON 3X4 STRL (CAST SUPPLIES) ×1
PADDING CAST COTTON 4X4 STRL (CAST SUPPLIES) ×1
SET CYSTO W/LG BORE CLAMP LF (SET/KITS/TRAYS/PACK) IMPLANT
SET HNDPC FAN SPRY TIP SCT (DISPOSABLE) IMPLANT
SOAP 2 % CHG 4 OZ (WOUND CARE) ×2 IMPLANT
SPONGE T-LAP 18X18 ~~LOC~~+RFID (SPONGE) ×2 IMPLANT
SPONGE T-LAP 4X18 ~~LOC~~+RFID (SPONGE) ×2 IMPLANT
SUT ETHILON 4 0 PS 2 18 (SUTURE) IMPLANT
SUT ETHILON 5 0 P 3 18 (SUTURE)
SUT NYLON ETHILON 5-0 P-3 1X18 (SUTURE) IMPLANT
SWAB COLLECTION DEVICE MRSA (MISCELLANEOUS) ×2 IMPLANT
SWAB CULTURE ESWAB REG 1ML (MISCELLANEOUS) IMPLANT
SYR CONTROL 10ML LL (SYRINGE) IMPLANT
TOWEL GREEN STERILE (TOWEL DISPOSABLE) ×2 IMPLANT
TOWEL GREEN STERILE FF (TOWEL DISPOSABLE) ×2 IMPLANT
TUBE CONNECTING 12X1/4 (SUCTIONS) ×2 IMPLANT
UNDERPAD 30X36 HEAVY ABSORB (UNDERPADS AND DIAPERS) ×2 IMPLANT
WATER STERILE IRR 1000ML POUR (IV SOLUTION) ×2 IMPLANT
YANKAUER SUCT BULB TIP NO VENT (SUCTIONS) ×2 IMPLANT

## 2021-05-27 NOTE — Consult Note (Signed)
Reason for Consult:Left wrist pain Referring Physician: Lala Lund Time called: 0803 Time at bedside: Kathleen [name] is an 61 y.o. female.  HPI: Kathleen [name] was admitted 11d ago with Covid-related encephalopathy. She began to develop left wrist pain about 2d after admission. This has gotten much worse over the last week though is slightly better over last 2d. Workup has been nonspecific as to etiology. No hx/o gout. No hx/o pain similar to this but she will have arthralgias when she has a lupus flare. She is RHD.  Past Medical History:  Diagnosis Date   Anemia    Arthritis    Cardiac arrest ([place])    Cellulitis    Cerebral vasculitis    COPD (chronic obstructive pulmonary disease) (HCC)    Elevated antinuclear antibody (ANA) level    Endometriosis    ESRD (end stage renal disease) on dialysis (HCC)    Gallstones    Hypertension    Lupus (HCC)    Migraine    PONV (postoperative nausea and vomiting)    Seizures ([place])    Sepsis ([place])    Sleep apnea    Suicide attempt ([place])    TB (tuberculosis)    Transient ischemic attack (TIA)    Vasculitis of skin     Past Surgical History:  Procedure Laterality Date   AV FISTULA PLACEMENT Left 07/04/2017   Procedure: ARTERIOVENOUS (AV) FISTULA CREATION;  Surgeon: Conrad Coudersport, MD;  Location: [place] [facility];  Service: Vascular;  Laterality: Left;   AV FISTULA PLACEMENT Left 11/27/2019   Procedure: INSERTION OF ARTERIOVENOUS (AV) GORE-TEX GRAFT LEFT UPPER ARM;  Surgeon: Angelia Mould, MD;  Location: [place];  Service: Vascular;  Laterality: Left;   AV FISTULA PLACEMENT Left 07/29/2020   Procedure: INSERTION OF LEFT UPPER ARM ARTERIOVENOUS (AV) GORE-TEX GRAFT;  Surgeon: Angelia Mould, MD;  Location: [facility] OR;  Service: Vascular;  Laterality: Left;   CESAREAN SECTION     ESOPHAGOGASTRODUODENOSCOPY N/A 07/15/2016   Procedure: ESOPHAGOGASTRODUODENOSCOPY (EGD);  Surgeon: Laurence Spates, MD;  Location: [facility];  Service: Endoscopy;   Laterality: N/A;   INSERTION OF DIALYSIS CATHETER Right 07/04/2017   Procedure: INSERTION OF TUNNELED  DIALYSIS CATHETER - RIGHT INTERNAL JUGULAR PLACEMENT;  Surgeon: Conrad Dale, MD;  Location: [facility] OR;  Service: Vascular;  Laterality: Right;   LUNG BIOPSY     TUBAL LIGATION      Family History  Problem Relation Age of Onset   Ovarian cancer Mother    Breast cancer Sister    Throat cancer Brother     Social History:  reports that she has been smoking cigarettes. She has been smoking an average of .25 packs per day. She has never used smokeless tobacco. She reports that she does not drink alcohol and does not use drugs.  Allergies: No Known Allergies  Medications: I have reviewed the patient's current medications.  Results for orders placed or performed during the hospital encounter of 05/15/21 (from the past 48 hour(s))  Magnesium     Status: Abnormal   Collection Time: 05/26/21  2:52 AM  Result Value Ref Range   Magnesium 2.5 (H) 1.7 - 2.4 mg/dL    Comment: Performed at [facility] [address], [place], [place] [address]  C-reactive protein     Status: Abnormal   Collection Time: 05/26/21  2:52 AM  Result Value Ref Range   CRP 9.6 (H) <1.0 mg/dL    Comment: Performed at [facility]  Lab, [address], [place], [place] [address]  Comprehensive metabolic panel     Status: Abnormal   Collection Time: 05/26/21  2:52 AM  Result Value Ref Range   Sodium 132 (L) 135 - 145 mmol/L   Potassium 6.0 (H) 3.5 - 5.1 mmol/L   Chloride 97 (L) 98 - 111 mmol/L   CO2 23 22 - 32 mmol/L   Glucose, Bld 94 70 - 99 mg/dL    Comment: Glucose reference range applies only to samples taken after fasting for at least 8 hours.   BUN 73 (H) 6 - 20 mg/dL   Creatinine, Ser 7.65 (H) 0.44 - 1.00 mg/dL   Calcium 8.5 (L) 8.9 - 10.3 mg/dL   Total Protein 5.9 (L) 6.5 - 8.1 g/dL   Albumin 1.8 (L) 3.5 - 5.0 g/dL   AST 15 15 - 41 U/L   ALT 11 0 - 44 U/L   Alkaline Phosphatase 63 38 - 126 U/L    Total Bilirubin 0.4 0.3 - 1.2 mg/dL   GFR, Estimated 6 (L) >60 mL/min    Comment: (NOTE) Calculated using the CKD-EPI Creatinine Equation (2021)    Anion gap 12 5 - 15    Comment: Performed at [facility] [address] [place], [place] [address]  CBC with Differential/Platelet     Status: Abnormal   Collection Time: 05/26/21  2:52 AM  Result Value Ref Range   WBC 12.1 (H) 4.0 - 10.5 K/uL   RBC 3.23 (L) 3.87 - 5.11 MIL/uL   Hemoglobin 9.2 (L) 12.0 - 15.0 g/dL   HCT 28.3 (L) 36.0 - 46.0 %   MCV 87.6 80.0 - 100.0 fL   MCH 28.5 26.0 - 34.0 pg   MCHC 32.5 30.0 - 36.0 g/dL   RDW 19.3 (H) 11.5 - 15.5 %   Platelets 254 150 - 400 K/uL   nRBC 0.0 0.0 - 0.2 %   Neutrophils Relative % 88 %   Neutro Abs 10.6 (H) 1.7 - 7.7 K/uL   Lymphocytes Relative 6 %   Lymphs Abs 0.8 0.7 - 4.0 K/uL   Monocytes Relative 5 %   Monocytes Absolute 0.6 0.1 - 1.0 K/uL   Eosinophils Relative 0 %   Eosinophils Absolute 0.0 0.0 - 0.5 K/uL   Basophils Relative 0 %   Basophils Absolute 0.0 0.0 - 0.1 K/uL   Immature Granulocytes 1 %   Abs Immature Granulocytes 0.11 (H) 0.00 - 0.07 K/uL    Comment: Performed at [facility] [address], [place], [place] [address]  Procalcitonin     Status: None   Collection Time: 05/26/21  2:52 AM  Result Value Ref Range   Procalcitonin 2.77 ng/mL    Comment:        Interpretation: PCT > 2 ng/mL: Systemic infection (sepsis) is likely, unless other causes are known. (NOTE)       Sepsis PCT Algorithm           Lower Respiratory Tract                                      Infection PCT Algorithm    ----------------------------     ----------------------------         PCT < 0.25 ng/mL                PCT < 0.10 ng/mL  Strongly encourage             Strongly discourage   discontinuation of antibiotics    initiation of antibiotics    ----------------------------     -----------------------------       PCT 0.25 - 0.50 ng/mL            PCT 0.10 - 0.25  ng/mL               OR       >80% decrease in PCT            Discourage initiation of                                            antibiotics      Encourage discontinuation           of antibiotics    ----------------------------     -----------------------------         PCT >= 0.50 ng/mL              PCT 0.26 - 0.50 ng/mL               AND       <80% decrease in PCT              Encourage initiation of                                             antibiotics       Encourage continuation           of antibiotics    ----------------------------     -----------------------------        PCT >= 0.50 ng/mL                  PCT > 0.50 ng/mL               AND         increase in PCT                  Strongly encourage                                      initiation of antibiotics    Strongly encourage escalation           of antibiotics                                     -----------------------------                                           PCT <= 0.25 ng/mL                                                 OR                                        >  80% decrease in PCT                                      Discontinue / Do not initiate                                             antibiotics  Performed at [facility] [address], [place], [place] [address]   Uric acid     Status: None   Collection Time: 05/26/21 10:04 AM  Result Value Ref Range   Uric Acid, Serum 5.9 2.5 - 7.1 mg/dL    Comment: Performed at [facility] [address], [place], [place] [address]  Magnesium     Status: None   Collection Time: 05/27/21  1:16 AM  Result Value Ref Range   Magnesium 2.2 1.7 - 2.4 mg/dL    Comment: Performed at [facility] [address] [place], [place] [address]  C-reactive protein     Status: Abnormal   Collection Time: 05/27/21  1:16 AM  Result Value Ref Range   CRP 7.0 (H) <1.0 mg/dL    Comment: Performed at [facility]  [address], [place], [place] [address]  Comprehensive metabolic panel     Status: Abnormal   Collection Time: 05/27/21  1:16 AM  Result Value Ref Range   Sodium 133 (L) 135 - 145 mmol/L   Potassium 3.6 3.5 - 5.1 mmol/L   Chloride 94 (L) 98 - 111 mmol/L   CO2 28 22 - 32 mmol/L   Glucose, Bld 100 (H) 70 - 99 mg/dL    Comment: Glucose reference range applies only to samples taken after fasting for at least 8 hours.   BUN 37 (H) 6 - 20 mg/dL   Creatinine, Ser 4.92 (H) 0.44 - 1.00 mg/dL    Comment: DELTA CHECK NOTED   Calcium 8.1 (L) 8.9 - 10.3 mg/dL   Total Protein 6.0 (L) 6.5 - 8.1 g/dL   Albumin 1.9 (L) 3.5 - 5.0 g/dL   AST 20 15 - 41 U/L   ALT 12 0 - 44 U/L   Alkaline Phosphatase 58 38 - 126 U/L   Total Bilirubin 0.4 0.3 - 1.2 mg/dL   GFR, Estimated 10 (L) >60 mL/min    Comment: (NOTE) Calculated using the CKD-EPI Creatinine Equation (2021)    Anion gap 11 5 - 15    Comment: Performed at [facility] [address] [place], [place] [address]  CBC with Differential/Platelet     Status: Abnormal   Collection Time: 05/27/21  1:16 AM  Result Value Ref Range   WBC 9.3 4.0 - 10.5 K/uL   RBC 3.25 (L) 3.87 - 5.11 MIL/uL   Hemoglobin 9.3 (L) 12.0 - 15.0 g/dL   HCT 28.3 (L) 36.0 - 46.0 %   MCV 87.1 80.0 - 100.0 fL   MCH 28.6 26.0 - 34.0 pg   MCHC 32.9 30.0 - 36.0 g/dL   RDW 19.2 (H) 11.5 - 15.5 %   Platelets 288 150 - 400 K/uL   nRBC 0.0 0.0 - 0.2 %   Neutrophils Relative % 76 %   Neutro Abs 6.9 1.7 - 7.7 K/uL   Lymphocytes Relative 15 %   Lymphs Abs  1.4 0.7 - 4.0 K/uL   Monocytes Relative 8 %   Monocytes Absolute 0.8 0.1 - 1.0 K/uL   Eosinophils Relative 0 %   Eosinophils Absolute 0.0 0.0 - 0.5 K/uL   Basophils Relative 0 %   Basophils Absolute 0.0 0.0 - 0.1 K/uL   Immature Granulocytes 1 %   Abs Immature Granulocytes 0.10 (H) 0.00 - 0.07 K/uL    Comment: Performed at [facility] [address] [place], [place] [address]  Procalcitonin     Status: None   Collection  Time: 05/27/21  1:16 AM  Result Value Ref Range   Procalcitonin 1.79 ng/mL    Comment:        Interpretation: PCT > 0.5 ng/mL and <= 2 ng/mL: Systemic infection (sepsis) is possible, but other conditions are known to elevate PCT as well. (NOTE)       Sepsis PCT Algorithm           Lower Respiratory Tract                                      Infection PCT Algorithm    ----------------------------     ----------------------------         PCT < 0.25 ng/mL                PCT < 0.10 ng/mL          Strongly encourage             Strongly discourage   discontinuation of antibiotics    initiation of antibiotics    ----------------------------     -----------------------------       PCT 0.25 - 0.50 ng/mL            PCT 0.10 - 0.25 ng/mL               OR       >80% decrease in PCT            Discourage initiation of                                            antibiotics      Encourage discontinuation           of antibiotics    ----------------------------     -----------------------------         PCT >= 0.50 ng/mL              PCT 0.26 - 0.50 ng/mL                AND       <80% decrease in PCT             Encourage initiation of                                             antibiotics       Encourage continuation           of antibiotics    ----------------------------     -----------------------------        PCT >= 0.50 ng/mL  PCT > 0.50 ng/mL               AND         increase in PCT                  Strongly encourage                                      initiation of antibiotics    Strongly encourage escalation           of antibiotics                                     -----------------------------                                           PCT <= 0.25 ng/mL                                                 OR                                        > 80% decrease in PCT                                      Discontinue / Do not initiate                                              antibiotics  Performed at [facility] [address], [place], [place] [address]     ECHO TEE  Result Date: 05/25/2021    TRANSESOPHOGEAL ECHO REPORT   Patient Name:   Kathleen [name] Date of Exam: 05/25/2021 Medical Rec #:  [id number]          Height:       64.0 in Accession #:    [id number]         Weight:       105.8 lb Date of Birth:  May 08, 1961          BSA:          1.493 m Patient Age:    29 years           BP:           121/71 mmHg Patient Gender: F                  HR:           120 bpm. Exam Location:  Inpatient Procedure: Transesophageal Echo, Cardiac Doppler, Color Doppler and Saline            Contrast Bubble Study Indications:     Bacteremia  History:         Patient has prior history of Echocardiogram examinations, most  recent 05/21/2021.  Sonographer:     Clayton Lefort RDCS (AE) Referring Phys:  [id number] [facility] Diagnosing Phys: Eleonore Chiquito MD PROCEDURE: After discussion of the risks and benefits of a TEE, an informed consent was obtained from the patient. TEE procedure time was 12 minutes. The transesophogeal probe was passed without difficulty through the esophogus of the patient. Imaged were obtained with the patient in a left lateral decubitus position. Local oropharyngeal anesthetic was provided with Cetacaine. Sedation performed by different physician. The patient was monitored while under deep sedation. Anesthestetic sedation was provided intravenously by Anesthesiology: 140mg  of Propofol. Image quality was excellent. The patient's vital signs; including heart rate, blood pressure, and oxygen saturation; remained stable throughout the procedure. The patient developed no complications during the procedure. IMPRESSIONS  1. Left ventricular ejection fraction, by estimation, is 60 to 65%. The left ventricle has normal function. The left ventricle has no regional wall motion abnormalities.  2. Right ventricular systolic function is  normal. The right ventricular size is normal.  3. No left atrial/left atrial appendage thrombus was detected. The LAA emptying velocity was 69 cm/s.  4. The mitral valve is normal in structure. Trivial mitral valve regurgitation. No evidence of mitral stenosis.  5. The aortic valve is tricuspid. There is mild calcification of the aortic valve. Aortic valve regurgitation is mild. Aortic valve sclerosis is present, with no evidence of aortic valve stenosis.  6. Aortic dilatation noted. There is moderate dilatation of the aortic root, measuring 44 mm. There is mild (Grade II) layered plaque involving the descending aorta.  7. Agitated saline contrast bubble study was negative, with no evidence of any interatrial shunt. Conclusion(s)/Recommendation(s): No evidence of vegetation/infective endocarditis on this transesophageael echocardiogram. FINDINGS  Left Ventricle: Left ventricular ejection fraction, by estimation, is 60 to 65%. The left ventricle has normal function. The left ventricle has no regional wall motion abnormalities. The left ventricular internal cavity size was normal in size. Right Ventricle: The right ventricular size is normal. No increase in right ventricular wall thickness. Right ventricular systolic function is normal. Left Atrium: Left atrial size was normal in size. No left atrial/left atrial appendage thrombus was detected. The LAA emptying velocity was 69 cm/s. Right Atrium: Right atrial size was normal in size. Pericardium: There is no evidence of pericardial effusion. Mitral Valve: The mitral valve is normal in structure. Trivial mitral valve regurgitation. No evidence of mitral valve stenosis. There is no evidence of mitral valve vegetation. Tricuspid Valve: The tricuspid valve is normal in structure. Tricuspid valve regurgitation is trivial. No evidence of tricuspid stenosis. There is no evidence of tricuspid valve vegetation. Aortic Valve: The aortic valve is tricuspid. There is mild  calcification of the aortic valve. Aortic valve regurgitation is mild. Aortic valve sclerosis is present, with no evidence of aortic valve stenosis. There is no evidence of aortic valve vegetation. Pulmonic Valve: The pulmonic valve was grossly normal. Pulmonic valve regurgitation is not visualized. No evidence of pulmonic stenosis. There is no evidence of pulmonic valve vegetation. Aorta: Aortic dilatation noted. There is moderate dilatation of the aortic root, measuring 44 mm. There is mild (Grade II) layered plaque involving the descending aorta. Venous: The left upper pulmonary vein, left lower pulmonary vein, right upper pulmonary vein and right lower pulmonary vein are normal. IAS/Shunts: The atrial septum is grossly normal. Agitated saline contrast was given intravenously to evaluate for intracardiac shunting. Agitated saline contrast bubble study was negative, with no evidence of any interatrial shunt.   AORTA  Ao Root diam: 4.40 cm Ao Asc diam:  3.85 cm Eleonore Chiquito MD Electronically signed by Eleonore Chiquito MD Signature Date/Time: 05/25/2021/3:10:42 PM    Final     Review of Systems  HENT:  Negative for ear discharge, ear pain, hearing loss and tinnitus.   Eyes:  Negative for photophobia and pain.  Respiratory:  Negative for cough and shortness of breath.   Cardiovascular:  Negative for chest pain.  Gastrointestinal:  Negative for abdominal pain, nausea and vomiting.  Genitourinary:  Negative for dysuria, flank pain, frequency and urgency.  Musculoskeletal:  Positive for arthralgias (Left wrist). Negative for back pain, myalgias and neck pain.  Neurological:  Negative for dizziness and headaches.  Hematological:  Does not bruise/bleed easily.  Psychiatric/Behavioral:  The patient is not nervous/anxious.   Blood pressure (!) 161/99, pulse 92, temperature (!) 97.5 F (36.4 C), resp. rate 20, height 5\' 4"  (1.626 m), weight 50 kg, SpO2 100 %. Physical Exam Constitutional:      General: She is  not in acute distress.    Appearance: She is well-developed. She is not diaphoretic.  HENT:     Head: Normocephalic and atraumatic.  Eyes:     General: No scleral icterus.       Right eye: No discharge.        Left eye: No discharge.     Conjunctiva/sclera: Conjunctivae normal.  Cardiovascular:     Rate and Rhythm: Normal rate and regular rhythm.  Pulmonary:     Effort: Pulmonary effort is normal. No respiratory distress.  Musculoskeletal:     Cervical back: Normal range of motion.     Comments: Left shoulder, elbow, wrist, digits- no skin wounds, severe TTP MCP joints to distal 1/3 FA, severe pain with wrist AROM/PROM more than 15 degrees, no instability, no blocks to motion  Sens  Ax/R/M/U intact  Mot   Ax/ R/ PIN/ M/ AIN/ U grossly intact, limited by pain  Rad 2+  Skin:    General: Skin is warm and dry.  Neurological:     Mental Status: She is alert.  Psychiatric:        Mood and Affect: Mood normal.        Behavior: Behavior normal.    Assessment/Plan: Left wrist pain -- Differential is still broad. Lack of joint effusion on CT is reassuring for septic arthritis but not exclusionary. NL uric acid and lack of gout hx reassuring for that. Viral arthritis is rare with Covid but has been reported. Reactive arthritis is also a possibility though, again, timing and organism are not typical. Agree with MRI ordered. If effusion present would consult IR for aspiration. Otherwise continue with supportive care and wrist splint for comfort.    Lisette Abu, PA-C Orthopedic Surgery [phone] 05/27/2021, 9:37 AM

## 2021-05-27 NOTE — Progress Notes (Addendum)
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    # Strep pneumoniae bacteremia #COVID infection SP remdesivir x 3days #ESRD on HD # Possible septic arthritis of L4-L5 -Pt has a productive cough, suspect that she has superimposed pneumonia leading to bacteremia. Although CXR today did not show consolidation and pt is no RA. Ordered sputum Cx. Repeated blood Cx to ensure clearance.  -TTE did not show valvular vegetation (05/21/21). Ordered TEE which did not show vegetation -MRI spine showed possible septic arthritis of L4-L5. IR engaged and fluid is not amenable to drainage.      #Left wrist septic arthritis/carpal osteomyelitis #LUE AVF #SLE with non adherence to plaquenil -Seen by Dr. Annabelle Harman at [facility] on 03/02/2018. Noted she was Dx at age 61 with ANA titer 1:1280 with centromere pattern and +dsDNA -  Pt reprots she has wrist pain for "a while". The CT w/ contrast of left wrist did not show an effusion or signs of infection. Clinical exam and imaging was not concernign for infection at the time. -Pt was started on prednisone 40mg /day->20mg .  -Pt had increased right hand swelling pain  this week. MRI showed moderate to large radiocarpal midcarpal joint effusion with erosive changes in carpal bones suspicious for septic arthritis/possible early osteomyelitis. -Pt underwent  exploration and drainage with orthopaedics on 05/27/20 and fluid and synovial tissue Bx was sent.   Recommendations:  -Continue cefazolin with HD, anticipate 6 weeks of antibiotics form negative Cx(EOT 07/01/21). Would not change course of antibiotics as left wrist infection is likely 2/2 strep.  -Follow OR Cx    Microbiology:   Antibiotics: Ceftriaxone 1/11-1/16 Penicillin 1/13-p Cefazolin 1/19-p   Cultures: Blood 1/11 strep pneumoniae  1/12 NGTD  SUBJECTIVE: No new complaints  Interval: Afebrile overnight. Wbc 9.3K. Review of Systems: Review of Systems  All other systems reviewed and are negative.   Scheduled Meds:  amLODipine  10 mg Oral Daily   Chlorhexidine Gluconate Cloth  6 each Topical Q0600   cinacalcet  60 mg Oral Q T,Th,Sa-HD   doxercalciferol  6 mcg Intravenous Q T,Th,Sa-HD   famotidine  20 mg Oral Daily   feeding supplement (NEPRO CARB STEADY)  237 mL Oral TID BM   heparin injection (subcutaneous)  5,000 Units Subcutaneous Q8H   multivitamin  1 tablet Oral QHS   nicotine  14 mg Transdermal Daily   predniSONE  40 mg Oral Q breakfast   sodium chloride flush  3 mL Intravenous Q12H   Continuous Infusions:  sodium chloride      ceFAZolin (ANCEF) IV      ceFAZolin (ANCEF) IV     PRN Meds:.sodium chloride, acetaminophen **OR** acetaminophen, hydrALAZINE, naLOXone (NARCAN)  injection, oxyCODONE, sodium chloride flush No Known Allergies  OBJECTIVE: Vitals:   05/26/21 2254 05/27/21 0453 05/27/21 6440  05/27/21 0800  BP:  102/62 (!) 153/97 (!) 161/99  Pulse:  91 90 92  Resp: 20 20 20    Temp:  98.5 F (36.9 C) 98.1 F (36.7 C) (!) 97.5 F (36.4 C)  TempSrc: Oral Oral Oral   SpO2: 97% 94% 100%   Weight:      Height:       Body mass index is 18.92 kg/m.  Physical Exam Constitutional:      Appearance: Normal appearance.  HENT:     Head: Normocephalic and atraumatic.     Right Ear: Tympanic membrane normal.     Left Ear: Tympanic membrane normal.      Nose: Nose normal.     Mouth/Throat:     Mouth: Mucous membranes are moist.  Eyes:     Extraocular Movements: Extraocular movements intact.     Conjunctiva/sclera: Conjunctivae normal.     Pupils: Pupils are equal, round, and reactive to light.  Cardiovascular:     Rate and Rhythm: Normal rate and regular rhythm.     Heart sounds: No murmur heard.   No friction rub. No gallop.  Pulmonary:     Effort: Pulmonary effort is normal.     Breath sounds: Normal breath sounds.  Abdominal:     General: Abdomen is flat.     Palpations: Abdomen is soft.  Musculoskeletal:     Comments: Left wrist bandaged.  Skin:    General: Skin is warm and dry.  Neurological:     General: No focal deficit present.     Mental Status: She is alert and oriented to person, place, and time.  Psychiatric:        Mood and Affect: Mood normal.      Lab Results Lab Results  Component Value Date   WBC 9.3 05/27/2021   HGB 9.3 (L) 05/27/2021   HCT 28.3 (L) 05/27/2021   MCV 87.1 05/27/2021   PLT 288 05/27/2021    Lab Results  Component Value Date   CREATININE 4.92 (H) 05/27/2021   BUN 37 (H) 05/27/2021   NA 133 (L) 05/27/2021   K 3.6 05/27/2021   CL 94 (L) 05/27/2021   CO2 28 05/27/2021    Lab Results  Component Value Date   ALT 12 05/27/2021   AST 20 05/27/2021   ALKPHOS 58 05/27/2021   BILITOT 0.4 05/27/2021        Laurice Record, [facility] 05/27/2021, 10:36 AM

## 2021-05-27 NOTE — Anesthesia Preprocedure Evaluation (Addendum)
Anesthesia Evaluation  Patient identified by MRN, date of birth, ID band Patient awake    Reviewed: Allergy & Precautions, NPO status , Unable to perform ROS - Chart review only  History of Anesthesia Complications (+) PONV and history of anesthetic complications  Airway Mallampati: II  TM Distance: >3 FB Neck ROM: Full    Dental  (+) Edentulous Upper, Dental Advisory Given   Pulmonary sleep apnea , COPD, Current Smoker,  05/15/2021 COVID positive   Pulmonary exam normal breath sounds clear to auscultation       Cardiovascular hypertension, Pt. on medications  Rhythm:Regular Rate:Normal  05/25/2021 TEE: EF 60-65%, normal LVF, normal RVFm trivial MR, mild AI, mod dilation of the aortic root (31mm)   Neuro/Psych  Headaches, Seizures -,  Recent COVID encephalopathy  Neuromuscular disease    GI/Hepatic   Endo/Other  lupus  Renal/GU ESRF and DialysisRenal disease     Musculoskeletal  (+) Arthritis , Septic arthritis    Abdominal   Peds  Hematology  (+) Blood dyscrasia (Hb 9.3), anemia ,   Anesthesia Other Findings   Reproductive/Obstetrics                            Anesthesia Physical Anesthesia Plan  ASA: 3 and emergent  Anesthesia Plan: General   Post-op Pain Management: Ofirmev IV (intra-op) and Dilaudid IV   Induction: Intravenous, Rapid sequence and Cricoid pressure planned  PONV Risk Score and Plan: 4 or greater and Ondansetron and Dexamethasone  Airway Management Planned: Oral ETT  Additional Equipment: None  Intra-op Plan:   Post-operative Plan: Extubation in OR  Informed Consent: I have reviewed the patients History and Physical, chart, labs and discussed the procedure including the risks, benefits and alternatives for the proposed anesthesia with the patient or authorized representative who has indicated his/her understanding and acceptance.     Dental advisory  given  Plan Discussed with: CRNA  Anesthesia Plan Comments:        Anesthesia Quick Evaluation

## 2021-05-27 NOTE — Progress Notes (Signed)
Orthopedic Tech Progress Note Patient Details:  Kathleen [name] August 06, 1960 [id number]  Ortho Devices Type of Ortho Device: Velcro wrist splint Ortho Device/Splint Location: LUE Ortho Device/Splint Interventions: Ordered, Application, Adjustment   Post Interventions Patient Tolerated: Well Instructions Provided: Care of device  [name] 05/27/2021, 9:29 AM

## 2021-05-27 NOTE — Op Note (Signed)
PREOPERATIVE DIAGNOSIS: Left wrist septic arthritis  POSTOPERATIVE DIAGNOSIS: Same  ATTENDING SURGEON: Dr. Iran Planas who scrubbed and present for the entire procedure  ASSISTANT SURGEON: None  ANESTHESIA: General via endotracheal tube  OPERATIVE PROCEDURE: Left wrist radiocarpal and midcarpal joint arthrotomy exploration and drainage  IMPLANTS: None  EBL: Minimal  RADIOGRAPHIC INTERPRETATION: None  SURGICAL INDICATIONS: Patient is a right-hand-dominant hemodialysis patient with a worsening pain and swelling in the left wrist.  Clinical examination and imaging studies were concerning for septic arthritis of the left wrist.  Patient was seen and evaluated and recommended undergo the above procedure.  The risks of surgery include but not limited to bleeding infection damage nearby nerves arteries or tendons loss of motion of the wrist and digits incomplete relief of symptoms and need for further surgical invention.  Signed informed consent obtained on the day of surgery.  SURGICAL TECHNIQUE: Patient was palpated via the preoperative holding area marked for marker made the left wrist indicate correct operative site.  Patient brought back operating placed supine on the anesthesia table where the general endotracheal anesthesia was administered.  Patient tolerates well.  A well-padded tourniquet placed on the left forearm and seal with the appropriate drape.  Left upper extremities then prepped and draped normal sterile fashion.  A timeout was called the correct site was identified procedure then begun.  Attention then turned to the left wrist the limb was then elevated tourniquet insufflated.  Longitudinal incision made directly over the Lister's tubercle.  Dissection carried down through the skin and subcutaneous tissue.  The EPL was then carefully retracted out of the way and going between the interval between the second the fourth dorsal compartment longitudinal arthrotomy was then made.  The  patient did have a moderate amount of fluid within the joint.  It did not look grossly infected.  Wound cultures were then taken of the of the fluid.  Synovial tissue was also taken and sent for culture as well.  Fluid and synovial tissue biopsy was done.  The arthrotomy was then carried out in the midcarpal joint and thorough exploration of the joint was done throughout.  Following this copious irrigation then done 2 L of saline solution were run through the wrist joint.  After arthrotomy and drainage the joint was then loosely reapproximated capsular closure with Monocryl suture.  The retinaculum was then closed with 3-0 Monocryl suture as well.  The skin was then closed with simple Prolene sutures.  Adaptic dressing a sterile compressive bandage then applied.  The patient placed in a well-padded volar splint taken recovery room extubated in good condition.  POSTOPERATIVE PLAN: Patient be returned back to the internal medicine service.  Continue with the short arm splint.  Follow wound cultures.  Based the duration and time of antibiotics and need for antibiotics based on the wound cultures.  We will continue to follow as an inpatient.  Continue with the splint ice elevate do not remove the splint.

## 2021-05-27 NOTE — Anesthesia Procedure Notes (Signed)
Procedure Name: Intubation Date/Time: 05/27/2021 6:27 PM Performed by: Babs Bertin, CRNA Pre-anesthesia Checklist: Patient identified, Emergency Drugs available, Suction available and Patient being monitored Patient Re-evaluated:Patient Re-evaluated prior to induction Oxygen Delivery Method: Circle System Utilized Preoxygenation: Pre-oxygenation with 100% oxygen Induction Type: IV induction Ventilation: Mask ventilation without difficulty Laryngoscope Size: Mac and 3 Grade View: Grade I Tube type: Oral Tube size: 7.0 mm Number of attempts: 1 Airway Equipment and Method: Stylet and Oral airway Placement Confirmation: ETT inserted through vocal cords under direct vision, positive ETCO2 and breath sounds checked- equal and bilateral Secured at: 21 cm Tube secured with: Tape Dental Injury: Teeth and Oropharynx as per pre-operative assessment

## 2021-05-27 NOTE — Progress Notes (Signed)
Nephrology Follow-Up Consult note   Assessment/Recommendations: Kathleen [name] is a/an 61 y.o. female with a past medical history significant for ESRD, admitted for COVID, strep pneumo bacteremia.     OP HD: [name]   3h 76min  50.8kg 350/1.5  2/2 bath F160  LUE AVG    - last post weight 1/3 was 48.2 kg  - sensipar 60 mg three times a week  - hectorol 6 mcg IV tiw  - venofer 100 mg weekly  - mircera 75 mcg q 2wks last 12/20, held 1/3 due to [place]  ESRD: HD on TTS schedule.  Strep pneumo bacteremia: On IV antibiotics.  Followed by ID.  TEE negative.  COVID-pneumonia: Continue treatment per primary team  EDW/BP: Under dry weight intermittently, slightly volume up at this time.  Continue to remove fluid as able.  GI bleeding: intermittent hematochezia. Outpatient endoscopic eval. CTM per primary  Anemia of CKD: Hgb 9.3, no esa currently but will consider. No iron given infection  Secondary hyperparathyroidism: Continue Hectorol and Sensipar.  Phos has been low. Hold binder  SLE: on prednisone per primary  Wrist swelling: f/u MRI   Recommendations conveyed to primary service.    [facility] 05/27/2021 10:26 AM  ___________________________________________________________  CC: ESRD, COVID, strep pneumo bacteremia  Interval History/Subjective: Patient continues to be tired today feeling like she cannot get much sleep.  States that her left wrist will swell during dialysis.  Otherwise dialysis went well.  Planning for MRI of the wrist today.   Medications:  Current Facility-Administered Medications  Medication Dose Route Frequency Provider Last Rate Last Admin   0.9 %  sodium chloride infusion  250 mL Intravenous PRN [name], [name], MD       acetaminophen (TYLENOL) tablet 650 mg  650 mg Oral Q6H PRN Geralynn Rile, MD   650 mg at 05/20/21 4403   Or   acetaminophen (TYLENOL) suppository 650 mg  650 mg Rectal Q6H PRN Geralynn Rile, MD       amLODipine (NORVASC) tablet 10 mg  10 mg Oral Daily [name], [name], MD   10 mg at 05/25/21 1059   ceFAZolin (ANCEF) IVPB 2g/100 mL premix  2 g Intravenous Q T,Th,Sat-1800 Laurice Record, MD       ceFAZolin (ANCEF) IVPB 2g/100 mL premix  2 g Intravenous Once Laurice Record, MD       Chlorhexidine Gluconate Cloth 2 % PADS 6 each  6 each Topical Q0600 [name], [name], MD   6 each at 05/26/21 0809   cinacalcet (SENSIPAR) tablet 60 mg  60 mg Oral Q T,Th,Sa-HD Geralynn Rile, MD   60 mg at 05/26/21 2011   doxercalciferol (HECTOROL) injection 6 mcg  6 mcg Intravenous Q T,Th,Sa-HD Geralynn Rile, MD   6 mcg at 05/26/21 2012   famotidine (PEPCID) tablet 20 mg  20 mg Oral Daily Geralynn Rile, MD   20 mg at 05/26/21 1133   feeding supplement (NEPRO CARB STEADY) liquid 237 mL  237 mL Oral TID BM Geralynn Rile, MD   237 mL at 05/26/21 1439   heparin injection 5,000 Units  5,000 Units Subcutaneous Q8H Geralynn Rile, MD   5,000 Units at 05/26/21 2246   hydrALAZINE (APRESOLINE) injection 10 mg  10 mg Intravenous Q6H PRN Geralynn Rile, MD   10 mg at 05/25/21 1101   multivitamin (RENA-VIT) tablet 1 tablet  1 tablet Oral QHS Geralynn Rile, MD  1 tablet at 05/26/21 2246   naloxone [facility]) injection 0.4 mg  0.4 mg Intravenous PRN Geralynn Rile, MD       nicotine (NICODERM CQ - dosed in mg/24 hours) patch 14 mg  14 mg Transdermal Daily [name], [name], MD   14 mg at 05/26/21 1133   oxyCODONE (Oxy IR/ROXICODONE) immediate release tablet 10 mg  10 mg Oral Q4H PRN Geralynn Rile, MD   10 mg at 05/27/21 0525   predniSONE (DELTASONE) tablet 40 mg  40 mg Oral Q breakfast Thurnell Lose, MD   40 mg at 05/26/21 0803   sodium chloride flush (NS) 0.9 % injection 3 mL  3 mL Intravenous Q12H Geralynn Rile, MD   3 mL at 05/26/21 1133   sodium chloride flush (NS) 0.9 % injection 3 mL  3 mL Intravenous PRN  Geralynn Rile, MD          Review of Systems: 10 systems reviewed and negative except per interval history/subjective  Physical Exam: Vitals:   05/27/21 0528 05/27/21 0800  BP: (!) 153/97 (!) 161/99  Pulse: 90 92  Resp: 20   Temp: 98.1 F (36.7 C) (!) 97.5 F (36.4 C)  SpO2: 100%    No intake/output data recorded.  Intake/Output Summary (Last 24 hours) at 05/27/2021 1026 Last data filed at 05/26/2021 2127 Gross per 24 hour  Intake 300 ml  Output 1000 ml  Net -700 ml   Constitutional: well-appearing, no acute distress ENMT: ears and nose without scars or lesions, MMM CV: normal rate, edema of the left wrist Respiratory: bilateral chest rise, normal work of breathing Gastrointestinal: soft, non-tender, no palpable masses or hernias Skin: no visible lesions or rashes Psych: alert, appropriate mood and affect   Test Results I personally reviewed new and old clinical labs and radiology tests Lab Results  Component Value Date   NA 133 (L) 05/27/2021   K 3.6 05/27/2021   CL 94 (L) 05/27/2021   CO2 28 05/27/2021   BUN 37 (H) 05/27/2021   CREATININE 4.92 (H) 05/27/2021   CALCIUM 8.1 (L) 05/27/2021   ALBUMIN 1.9 (L) 05/27/2021   PHOS 2.3 (L) 05/24/2021

## 2021-05-27 NOTE — Consult Note (Signed)
Reason for Consult LEFT WRIST PAIN SWELLING Referring Physician:   [name] is an 61 y.o. female.  HPI: Kathleen [name] was admitted 11d ago with Covid-related encephalopathy. She began to develop left wrist pain about 2d after admission. This has gotten much worse over the last week though is slightly better over last 2d. Workup has been nonspecific as to etiology. No hx/o gout. No hx/o pain similar to this but she will have arthralgias when she has a lupus flare. She is RHD.  Past Medical History:  Diagnosis Date   Anemia    Arthritis    Cardiac arrest ([place])    Cellulitis    Cerebral vasculitis    COPD (chronic obstructive pulmonary disease) (HCC)    Elevated antinuclear antibody (ANA) level    Endometriosis    ESRD (end stage renal disease) on dialysis (HCC)    Gallstones    Hypertension    Lupus (HCC)    Migraine    PONV (postoperative nausea and vomiting)    Seizures ([place])    Sepsis ([place])    Sleep apnea    Suicide attempt ([place])    TB (tuberculosis)    Transient ischemic attack (TIA)    Vasculitis of skin     Past Surgical History:  Procedure Laterality Date   AV FISTULA PLACEMENT Left 07/04/2017   Procedure: ARTERIOVENOUS (AV) FISTULA CREATION;  Surgeon: Conrad Bertrand, MD;  Location: [facility];  Service: Vascular;  Laterality: Left;   AV FISTULA PLACEMENT Left 11/27/2019   Procedure: INSERTION OF ARTERIOVENOUS (AV) GORE-TEX GRAFT LEFT UPPER ARM;  Surgeon: Angelia Mould, MD;  Location: [facility];  Service: Vascular;  Laterality: Left;   AV FISTULA PLACEMENT Left 07/29/2020   Procedure: INSERTION OF LEFT UPPER ARM ARTERIOVENOUS (AV) GORE-TEX GRAFT;  Surgeon: Angelia Mould, MD;  Location: [facility] OR;  Service: Vascular;  Laterality: Left;   CESAREAN SECTION     ESOPHAGOGASTRODUODENOSCOPY N/A 07/15/2016   Procedure: ESOPHAGOGASTRODUODENOSCOPY (EGD);  Surgeon: Laurence Spates, MD;  Location: [facility] ENDOSCOPY;  Service: Endoscopy;  Laterality: N/A;   INSERTION OF DIALYSIS CATHETER  Right 07/04/2017   Procedure: INSERTION OF TUNNELED  DIALYSIS CATHETER - RIGHT INTERNAL JUGULAR PLACEMENT;  Surgeon: Conrad Osceola, MD;  Location: [facility] OR;  Service: Vascular;  Laterality: Right;   LUNG BIOPSY     TUBAL LIGATION      Family History  Problem Relation Age of Onset   Ovarian cancer Mother    Breast cancer Sister    Throat cancer Brother     Social History:  reports that she has been smoking cigarettes. She has been smoking an average of .25 packs per day. She has never used smokeless tobacco. She reports that she does not drink alcohol and does not use drugs.  Allergies: No Known Allergies  Medications: I have reviewed the patient's current medications.  Results for orders placed or performed during the hospital encounter of 05/15/21 (from the past 48 hour(s))  Magnesium     Status: Abnormal   Collection Time: 05/26/21  2:52 AM  Result Value Ref Range   Magnesium 2.5 (H) 1.7 - 2.4 mg/dL    Comment: Performed at [facility] [address], [place], [place] [address]  C-reactive protein     Status: Abnormal   Collection Time: 05/26/21  2:52 AM  Result Value Ref Range   CRP 9.6 (H) <1.0 mg/dL    Comment: Performed at [facility] [address], [place], [place]  [address]  Comprehensive metabolic panel     Status: Abnormal   Collection Time: 05/26/21  2:52 AM  Result Value Ref Range   Sodium 132 (L) 135 - 145 mmol/L   Potassium 6.0 (H) 3.5 - 5.1 mmol/L   Chloride 97 (L) 98 - 111 mmol/L   CO2 23 22 - 32 mmol/L   Glucose, Bld 94 70 - 99 mg/dL    Comment: Glucose reference range applies only to samples taken after fasting for at least 8 hours.   BUN 73 (H) 6 - 20 mg/dL   Creatinine, Ser 7.65 (H) 0.44 - 1.00 mg/dL   Calcium 8.5 (L) 8.9 - 10.3 mg/dL   Total Protein 5.9 (L) 6.5 - 8.1 g/dL   Albumin 1.8 (L) 3.5 - 5.0 g/dL   AST 15 15 - 41 U/L   ALT 11 0 - 44 U/L   Alkaline Phosphatase 63 38 - 126 U/L   Total Bilirubin 0.4 0.3 - 1.2 mg/dL   GFR,  Estimated 6 (L) >60 mL/min    Comment: (NOTE) Calculated using the CKD-EPI Creatinine Equation (2021)    Anion gap 12 5 - 15    Comment: Performed at [facility] [address], [place], [place] [address]  CBC with Differential/Platelet     Status: Abnormal   Collection Time: 05/26/21  2:52 AM  Result Value Ref Range   WBC 12.1 (H) 4.0 - 10.5 K/uL   RBC 3.23 (L) 3.87 - 5.11 MIL/uL   Hemoglobin 9.2 (L) 12.0 - 15.0 g/dL   HCT 28.3 (L) 36.0 - 46.0 %   MCV 87.6 80.0 - 100.0 fL   MCH 28.5 26.0 - 34.0 pg   MCHC 32.5 30.0 - 36.0 g/dL   RDW 19.3 (H) 11.5 - 15.5 %   Platelets 254 150 - 400 K/uL   nRBC 0.0 0.0 - 0.2 %   Neutrophils Relative % 88 %   Neutro Abs 10.6 (H) 1.7 - 7.7 K/uL   Lymphocytes Relative 6 %   Lymphs Abs 0.8 0.7 - 4.0 K/uL   Monocytes Relative 5 %   Monocytes Absolute 0.6 0.1 - 1.0 K/uL   Eosinophils Relative 0 %   Eosinophils Absolute 0.0 0.0 - 0.5 K/uL   Basophils Relative 0 %   Basophils Absolute 0.0 0.0 - 0.1 K/uL   Immature Granulocytes 1 %   Abs Immature Granulocytes 0.11 (H) 0.00 - 0.07 K/uL    Comment: Performed at [facility] [address], [place], [place] [address]  Procalcitonin     Status: None   Collection Time: 05/26/21  2:52 AM  Result Value Ref Range   Procalcitonin 2.77 ng/mL    Comment:        Interpretation: PCT > 2 ng/mL: Systemic infection (sepsis) is likely, unless other causes are known. (NOTE)       Sepsis PCT Algorithm           Lower Respiratory Tract                                      Infection PCT Algorithm    ----------------------------     ----------------------------         PCT < 0.25 ng/mL                PCT < 0.10 ng/mL          Strongly encourage  Strongly discourage   discontinuation of antibiotics    initiation of antibiotics    ----------------------------     -----------------------------       PCT 0.25 - 0.50 ng/mL            PCT 0.10 - 0.25 ng/mL               OR       >80% decrease  in PCT            Discourage initiation of                                            antibiotics      Encourage discontinuation           of antibiotics    ----------------------------     -----------------------------         PCT >= 0.50 ng/mL              PCT 0.26 - 0.50 ng/mL               AND       <80% decrease in PCT              Encourage initiation of                                             antibiotics       Encourage continuation           of antibiotics    ----------------------------     -----------------------------        PCT >= 0.50 ng/mL                  PCT > 0.50 ng/mL               AND         increase in PCT                  Strongly encourage                                      initiation of antibiotics    Strongly encourage escalation           of antibiotics                                     -----------------------------                                           PCT <= 0.25 ng/mL                                                 OR                                        >  80% decrease in PCT                                      Discontinue / Do not initiate                                             antibiotics  Performed at [facility] [address] [place], [place] [address]   Uric acid     Status: None   Collection Time: 05/26/21 10:04 AM  Result Value Ref Range   Uric Acid, Serum 5.9 2.5 - 7.1 mg/dL    Comment: Performed at [facility] [address] [place], [place] [address]  Magnesium     Status: None   Collection Time: 05/27/21  1:16 AM  Result Value Ref Range   Magnesium 2.2 1.7 - 2.4 mg/dL    Comment: Performed at [facility] [address] [place], [place] [address]  C-reactive protein     Status: Abnormal   Collection Time: 05/27/21  1:16 AM  Result Value Ref Range   CRP 7.0 (H) <1.0 mg/dL    Comment: Performed at [facility] [address] [place], [place] [address]  Comprehensive  metabolic panel     Status: Abnormal   Collection Time: 05/27/21  1:16 AM  Result Value Ref Range   Sodium 133 (L) 135 - 145 mmol/L   Potassium 3.6 3.5 - 5.1 mmol/L   Chloride 94 (L) 98 - 111 mmol/L   CO2 28 22 - 32 mmol/L   Glucose, Bld 100 (H) 70 - 99 mg/dL    Comment: Glucose reference range applies only to samples taken after fasting for at least 8 hours.   BUN 37 (H) 6 - 20 mg/dL   Creatinine, Ser 4.92 (H) 0.44 - 1.00 mg/dL    Comment: DELTA CHECK NOTED   Calcium 8.1 (L) 8.9 - 10.3 mg/dL   Total Protein 6.0 (L) 6.5 - 8.1 g/dL   Albumin 1.9 (L) 3.5 - 5.0 g/dL   AST 20 15 - 41 U/L   ALT 12 0 - 44 U/L   Alkaline Phosphatase 58 38 - 126 U/L   Total Bilirubin 0.4 0.3 - 1.2 mg/dL   GFR, Estimated 10 (L) >60 mL/min    Comment: (NOTE) Calculated using the CKD-EPI Creatinine Equation (2021)    Anion gap 11 5 - 15    Comment: Performed at [facility] [address] [place], [place] [address]  CBC with Differential/Platelet     Status: Abnormal   Collection Time: 05/27/21  1:16 AM  Result Value Ref Range   WBC 9.3 4.0 - 10.5 K/uL   RBC 3.25 (L) 3.87 - 5.11 MIL/uL   Hemoglobin 9.3 (L) 12.0 - 15.0 g/dL   HCT 28.3 (L) 36.0 - 46.0 %   MCV 87.1 80.0 - 100.0 fL   MCH 28.6 26.0 - 34.0 pg   MCHC 32.9 30.0 - 36.0 g/dL   RDW 19.2 (H) 11.5 - 15.5 %   Platelets 288 150 - 400 K/uL   nRBC 0.0 0.0 - 0.2 %   Neutrophils Relative % 76 %   Neutro Abs 6.9 1.7 - 7.7 K/uL   Lymphocytes Relative 15 %   Lymphs Abs  1.4 0.7 - 4.0 K/uL   Monocytes Relative 8 %   Monocytes Absolute 0.8 0.1 - 1.0 K/uL   Eosinophils Relative 0 %   Eosinophils Absolute 0.0 0.0 - 0.5 K/uL   Basophils Relative 0 %   Basophils Absolute 0.0 0.0 - 0.1 K/uL   Immature Granulocytes 1 %   Abs Immature Granulocytes 0.10 (H) 0.00 - 0.07 K/uL    Comment: Performed at [facility] [address], [place], [place] [address]  Procalcitonin     Status: None   Collection Time: 05/27/21  1:16 AM  Result Value Ref  Range   Procalcitonin 1.79 ng/mL    Comment:        Interpretation: PCT > 0.5 ng/mL and <= 2 ng/mL: Systemic infection (sepsis) is possible, but other conditions are known to elevate PCT as well. (NOTE)       Sepsis PCT Algorithm           Lower Respiratory Tract                                      Infection PCT Algorithm    ----------------------------     ----------------------------         PCT < 0.25 ng/mL                PCT < 0.10 ng/mL          Strongly encourage             Strongly discourage   discontinuation of antibiotics    initiation of antibiotics    ----------------------------     -----------------------------       PCT 0.25 - 0.50 ng/mL            PCT 0.10 - 0.25 ng/mL               OR       >80% decrease in PCT            Discourage initiation of                                            antibiotics      Encourage discontinuation           of antibiotics    ----------------------------     -----------------------------         PCT >= 0.50 ng/mL              PCT 0.26 - 0.50 ng/mL                AND       <80% decrease in PCT             Encourage initiation of                                             antibiotics       Encourage continuation           of antibiotics    ----------------------------     -----------------------------        PCT >= 0.50 ng/mL  PCT > 0.50 ng/mL               AND         increase in PCT                  Strongly encourage                                      initiation of antibiotics    Strongly encourage escalation           of antibiotics                                     -----------------------------                                           PCT <= 0.25 ng/mL                                                 OR                                        > 80% decrease in PCT                                      Discontinue / Do not initiate                                             antibiotics  Performed at  [facility] [address] [place], [place] [address]     MR WRIST LEFT WO CONTRAST  Result Date: 05/27/2021 CLINICAL DATA:  COVID related encephalopathy with progressive left wrist pain over the last week. History of end-stage renal disease and systemic lupus erythematosus. Bacteremia. EXAM: MR OF THE LEFT WRIST WITHOUT CONTRAST TECHNIQUE: Multiplanar, multisequence MR imaging of the left wrist was performed. No intravenous contrast was administered. COMPARISON:  Radiographs and CT 05/20/2021. FINDINGS: Ligaments: The scapholunate and lunotriquetral ligaments appear intact. Triangular fibrocartilage: The triangular fibrocartilage complex appears intact. The ulnar variance is neutral. Tendons: The flexor and extensor tendons appear unremarkable. No significant tenosynovitis identified. Carpal tunnel/median nerve: Unremarkable. Guyon's canal: Unremarkable. Joint/cartilage: Moderate to large radiocarpal and midcarpal joint effusions, not clearly demonstrated on previous CT. No significant fluid within the distal radioulnar joint. Bones/carpal alignment: The carpal bone alignment is normal.Patchy subchondral edema within the scaphoid, lunate, triquetrum and hamate bone. There are possible small erosions. No cortical destruction or acute fracture identified. Other: Generalized subcutaneous edema surrounding the wrist, nonspecific, although suspicious for cellulitis. No focal fluid collections identified. IMPRESSION: 1. Moderate to large radiocarpal and midcarpal joint effusions with nonspecific patchy subchondral edema and possible erosions in the carpal bones. In this clinical context, these findings are suspicious for septic arthritis and possible early  osteomyelitis. Joint aspiration recommended. 2. In addition, there is generalized subcutaneous edema surrounding the wrist which is nonspecific, although suggestive of cellulitis. 3. No focal drainable soft tissue fluid collection or  tenosynovitis. The wrist ligaments appear intact. 4. These results will be called to the ordering clinician or representative by the Radiologist Assistant, and communication documented in the PACS or [employer]. Electronically Signed   By: Richardean Sale M.D.   On: 05/27/2021 15:47    ROS Blood pressure 118/73, pulse 92, temperature 99.2 F (37.3 C), resp. rate 16, height 5\' 4"  (1.626 m), weight 50 kg, SpO2 100 %. Physical Exam LUE: PAIN WITH PASSIVE AND ACTIVE MOTION OF WRIST,  LIMITED WRIST MOBILITY FINGERS WARM WELL PERFUSED  MRI REVIEWED CONCERNING FOR INFECTIOUS ARTHRITIS OF LEFT WRIST Assessment/Plan: LEFT WRIST SEPTIC WRIST  PLAN FOR EMERGENT IRRIGATION AND DEBRIDEMENT OF INFECTED LEFT WRIST PT HAS BEEN ON ANTIBIOTICS DISCUSSED WITH OPERATING ROOM AND NURSE AT [facility] 05/27/2021, 5:10 PM

## 2021-05-27 NOTE — Progress Notes (Signed)
Occupational Therapy Treatment Patient Details Name: Kathleen [name] MRN: [id number] DOB: 10-06-1960 Today's Date: 05/27/2021   History of present illness Pt is a 61 y.o. female who presented 05/15/21 with weakness. Pt with GI bleed. work up underway, but current diagnosis:  FTT, chronic combined systolic/diastolic HF, COVID -67  PMH: ESRD on HD, COPD, HTN, migraine, lupus, seziures, anemia, cardiac arrest hx, cellulitis, cerebral vasculitis, hx of suicide attempt, TB, TIA   OT comments  Pt needing more assistance for ADLs due to L wrist and hand pain. Using prefab wrist cockup splint for comfort and reports heat helps. MD has ordered MRI. Pt ambulated to bathroom/sink with R hand held assist for grooming. Min assist for UE dressing and max assist to don socks. Continues to be appropriate for SNF level rehab.   Recommendations for follow up therapy are one component of a multi-disciplinary discharge planning process, led by the attending physician.  Recommendations may be updated based on patient status, additional functional criteria and insurance authorization.    Follow Up Recommendations  Skilled nursing-short term rehab (<3 hours/day)    Assistance Recommended at Discharge Frequent or constant Supervision/Assistance  Patient can return home with the following  A little help with walking and/or transfers;A lot of help with bathing/dressing/bathroom   Equipment Recommendations  None recommended by OT    Recommendations for Other Services      Precautions / Restrictions Precautions Precautions: Fall Required Braces or Orthoses: Splint/Cast Splint/Cast: prefab wrist cock up       Mobility Bed Mobility Overal bed mobility: Needs Assistance Bed Mobility: Sit to Sidelying   Sidelying to sit: Min assist     Sit to sidelying: Min assist      Transfers Overall transfer level: Needs assistance Equipment used: 1 person hand held assist Transfers: Sit to/from Stand Sit to  Stand: Min assist           General transfer comment: steadying assist     Balance Overall balance assessment: Needs assistance Sitting-balance support: No upper extremity supported, Feet supported Sitting balance-Leahy Scale: Fair       Standing balance-Leahy Scale: Poor Standing balance comment: relies on at least 1 UE support.                           ADL either performed or assessed with clinical judgement   ADL Overall ADL's : Needs assistance/impaired     Grooming: Wash/dry hands;Standing;Min guard;Oral care           Upper Body Dressing : Minimal assistance;Sitting   Lower Body Dressing: Maximal assistance;Bed level               Functional mobility during ADLs: Minimal assistance General ADL Comments: pt with increased pain and swelling in L hand and wrist with inability to use, increases need for more assistance with ADLs    Extremity/Trunk Assessment              Vision       Perception     Praxis      Cognition Arousal/Alertness: Awake/alert Behavior During Therapy: WFL for tasks assessed/performed Overall Cognitive Status: Impaired/Different from baseline Area of Impairment: Problem solving                             Problem Solving: Slow processing          Exercises      Shoulder  Instructions       General Comments      Pertinent Vitals/ Pain       Pain Assessment Pain Assessment: Faces Faces Pain Scale: Hurts whole lot Pain Location: left hand/wrist Pain Descriptors / Indicators: Grimacing, Guarding Pain Intervention(s): Patient requesting pain meds-RN notified, Repositioned, Heat applied  Home Living                                          Prior Functioning/Environment              Frequency  Min 2X/week        Progress Toward Goals  OT Goals(current goals can now be found in the care plan section)  Progress towards OT goals: Not progressing toward  goals - comment (L UE pain)  Acute Rehab OT Goals OT Goal Formulation: With patient Time For Goal Achievement: 05/30/21 Potential to Achieve Goals: Good  Plan Discharge plan remains appropriate    Co-evaluation                 AM-PAC OT "6 Clicks" Daily Activity     Outcome Measure   Help from another person eating meals?: A Little Help from another person taking care of personal grooming?: A Little Help from another person toileting, which includes using toliet, bedpan, or urinal?: A Little Help from another person bathing (including washing, rinsing, drying)?: A Lot Help from another person to put on and taking off regular upper body clothing?: A Little Help from another person to put on and taking off regular lower body clothing?: A Lot 6 Click Score: 16    End of Session Equipment Utilized During Treatment: Gait belt  OT Visit Diagnosis: Unsteadiness on feet (R26.81);Pain Pain - Right/Left: Left Pain - part of body: Arm   Activity Tolerance Patient limited by pain   Patient Left in bed;with call bell/phone within reach;with bed alarm set   Nurse Communication          Time: 8315-1761 OT Time Calculation (min): 16 min  Charges: OT General Charges $OT Visit: 1 Visit OT Treatments $Self Care/Home Management : 8-22 mins  Nestor Lewandowsky, OTR/L Acute Rehabilitation Services Pager: [phone] Office: [phone]   [name] 05/27/2021, 12:22 PM

## 2021-05-27 NOTE — Progress Notes (Signed)
PROGRESS NOTE        PATIENT DETAILS Name: Kathleen [name] Age: 61 y.o. Sex: female Date of Birth: [birth year] Admit Date: 05/15/2021 Admitting Physician Orma Flaming, MD IWP:YKDXI, Beverly Gust, NP  Brief Narrative:  Patient is a 61 y.o. female with history of ESRD on HD TTS, HFrEF, SLE, anemia due to CKD, HTN-who presented with at least 1 week history of worsening weakness/lethargy, intermittent diarrhea, hematochezia-she had missed her last 3 HD sessions prior to hospitalization due to weakness/lethargy.  She was found to have COVID-19 infection-and thought to have uremic encephalopathy in the setting of missed HD.  She stabilized with Remdesivir and hemodialysis.  Unfortunately-Hospital course was complicated by severe arthralgias mostly in her left wrist/spine-low-grade fever-she was subsequently found to have streptococcal pneumonia bacteremia.  See below for further details.  Significant Hospital events 1/06>> admit with diarrhea/intermittent hematochezia from COVID-uremic encephalopathy from missed HD. 1/10>> developed arthralgias-mostly in her left wrist.Prednisone ordered 1/11>> low-grade fever-worsening left wrist arthralgia-CT left wrist negative for effusion.   1/11>> blood culture positive Streptococcus pneumoniae-started on IV Rocephin.  Subjective:  Patient in bed, appears comfortable, denies any headache, no fever, no chest pain or pressure, no shortness of breath , no abdominal pain. No new focal weakness. Back and L. Wrist pain is better.  Objective: Vitals: Blood pressure (!) 161/99, pulse 92, temperature (!) 97.5 F (36.4 C), resp. rate 20, height 5\' 4"  (1.626 m), weight 50 kg, SpO2 100 %.   Exam:  Awake Alert, No new F.N deficits, Normal affect [place].AT,PERRAL Supple Neck, No JVD,   Symmetrical Chest wall movement, Good air movement bilaterally, CTAB RRR,No Gallops, Rubs or new Murmurs,  +ve B.Sounds, Abd Soft, No tenderness,   Left wrist  has mild swelling today with decreased range of motion due to pain both active and passive, no warmth but tender to touch    Assessment/Plan:  COVID-19 infection: Improved-diarrhea has resolved-no PNA on CXR-completed 3 days of Remdesivir   Diarrhea with intermittent hematochezia: Diarrhea due to COVID-19 infection-suspect hematochezia was in the setting of some mucosal sloughing.  Hematochezia and diarrhea have resolved.  Given significant debility/deconditioning/stable hemoglobin-suspect we can postpone endoscopic evaluation to the outpatient setting.  Discussed this with patient-she agrees.    Acute metabolic encephalopathy: Resolved.  Due to uremia in the setting of missed HD with some contribution from COVID-19.    Streptococcal pneumonia with bacteremia: Developed low-grade fever/left wrist arthralgia/back pain on 1/10-1/11-blood cultures are positive for Streptococcus pneumonia.  No PNA on CXR.  TTE without vegetations.  MRI with suspicious septic arthritis at L4-L5 level-Per IR-this is not amenable to drainage.  TEE stable on 05/25/2021.  Currently on IV penicillin per ID.  Repeat blood cultures drawn on 05/21/2021 are negative so far.  Case discussed with neurosurgeon Dr. Kathyrn Sheriff including the MRI findings, does not think these findings are of any significance.  No further work-up for this issue of nonspecific MRI changes.   Left wrist arthralgia: Slowly improving after initiation of steroids on 1/10 and IV antibiotics on 1/11.  Although she has a history of lupus with positive ANA-dsDNA level/complement levels are not suggestive of lupus flare.  Pain improved on steroids, CT left wrist unremarkable, wrist on exam is tender to touch with limited active and passive range of motion due to pain.  MRI of the left wrist pending, will add wrist  splint, continue pain medications, consulted orthopedics.  SLE: Claims to have a longstanding history of lupus-followed by rheumatology at [facility].  Per patient-her Plaquenil was discontinued by her nephrologist several months ago-I currently on oral prednisone 40 mg daily, plan is post discharge follow-up with her rheumatologist at [facility].  ESRD: Nephrology following-we will defer hemodialysis and electrolyte management to nephrology service.  Chronic systolic heart failure (EF 45-50% on 03/13/2021): Volume status is stable.  Watch closely.  HTN: BP high on Norvasc have added Coreg and as needed hydralazine.  Prolonged QTC: Appears to have develops some amount of QTC elongation over the past few months-repeat EKG on 1/11 and 1/13 with stable/normalized QTC.    We will attempt to keep K> 4, and Mg>2.  Monitor EKGs periodically-continue telemetry monitoring.    Tobacco abuse: Continue transdermal nicotine  LLL Nodule - outpt Pulm follow up, within a month of DC.  Deconditioning/debility: Likely due to COVID-19 infection-and worsening uremia from missed HD.  Hopefully will continue to improve with further hemodialysis-improvement in underlying COVID-19 infection.  Evaluated by PT services-felt to require SNF on discharge.  Social worker following.   BMI Estimated body mass index is 18.92 kg/m as calculated from the following:   Height as of this encounter: 5\' 4"  (1.626 m).   Weight as of this encounter: 50 kg.    Procedures: None Consults: ID, nephrology.  Neurosurgeon Dr. Kathyrn Sheriff on 05/24/2021 over the phone. DVT Prophylaxis: SQ heparin Code Status:Full code  Family Communication: None at bedside  Disposition Plan: Status is: Inpatient  Severe debility/deconditioning-now with worsening encephalopathy due to uremia-needs hemodialysis today.  On IV Remdesivir for COVID.  SNF planned on discharge.  Diet: Diet Order             Diet renal/carb modified with fluid restriction Diet-HS Snack? Nothing; Fluid restriction: 1200 mL Fluid; Room service appropriate? Yes; Fluid consistency: Thin  Diet effective now                     MEDICATIONS: Scheduled Meds:  amLODipine  10 mg Oral Daily   carvedilol  3.125 mg Oral BID WC   Chlorhexidine Gluconate Cloth  6 each Topical Q0600   cinacalcet  60 mg Oral Q T,Th,Sa-HD   doxercalciferol  6 mcg Intravenous Q T,Th,Sa-HD   famotidine  20 mg Oral Daily   feeding supplement (NEPRO CARB STEADY)  237 mL Oral TID BM   heparin injection (subcutaneous)  5,000 Units Subcutaneous Q8H   multivitamin  1 tablet Oral QHS   nicotine  14 mg Transdermal Daily   predniSONE  40 mg Oral Q breakfast   sodium chloride flush  3 mL Intravenous Q12H   Continuous Infusions:  sodium chloride      ceFAZolin (ANCEF) IV      ceFAZolin (ANCEF) IV 2 g (05/27/21 1056)   PRN Meds:.sodium chloride, acetaminophen **OR** acetaminophen, hydrALAZINE, naLOXone (NARCAN)  injection, oxyCODONE, sodium chloride flush   I have personally reviewed following labs and imaging studies  LABORATORY DATA:  Recent Labs  Lab 05/23/21 0410 05/24/21 0119 05/25/21 0029 05/26/21 0252 05/27/21 0116  WBC 12.0* 11.2* 12.5* 12.1* 9.3  HGB 9.9* 8.9* 10.0* 9.2* 9.3*  HCT 30.0* 27.7* 31.7* 28.3* 28.3*  PLT 178 214 235 254 288  MCV 87.0 86.6 89.5 87.6 87.1  MCH 28.7 27.8 28.2 28.5 28.6  MCHC 33.0 32.1 31.5 32.5 32.9  RDW 19.3* 19.1* 19.3* 19.3* 19.2*  LYMPHSABS  --   --  1.2 0.8 1.4  MONOABS  --   --  1.0 0.6 0.8  EOSABS  --   --  0.0 0.0 0.0  BASOSABS  --   --  0.0 0.0 0.0    Recent Labs  Lab 05/23/21 0410 05/24/21 0119 05/24/21 0728 05/25/21 0029 05/26/21 0252 05/27/21 0116  NA 135 131*  --  133* 132* 133*  K 4.3 4.6  --  4.3 6.0* 3.6  CL 94* 93*  --  95* 97* 94*  CO2 26 27  --  30 23 28   GLUCOSE 115* 98  --  100* 94 100*  BUN 65* 91*  --  51* 73* 37*  CREATININE 7.04* 8.60*  --  5.73* 7.65* 4.92*  CALCIUM 8.8* 8.7*  --  9.0 8.5* 8.1*  AST  --   --   --  18 15 20   ALT  --   --   --  12 11 12   ALKPHOS  --   --   --  70 63 58  BILITOT  --   --   --  0.4 0.4 0.4  ALBUMIN  2.1* 1.8*  --  1.9* 1.8* 1.9*  MG 2.5* 2.8*  --  2.5* 2.5* 2.2  CRP  --   --   --  7.1* 9.6* 7.0*  PROCALCITON  --   --  6.99 4.20 2.77 1.79    RADIOLOGY STUDIES/RESULTS: ECHO TEE  Result Date: 05/25/2021    TRANSESOPHOGEAL ECHO REPORT   Patient Name:   Kathleen [name] Date of Exam: 05/25/2021 Medical Rec #:  [id number]          Height:       64.0 in Accession #:    [id number]         Weight:       105.8 lb Date of Birth:  07/04/1960          BSA:          1.493 m Patient Age:    60 years           BP:           121/71 mmHg Patient Gender: F                  HR:           120 bpm. Exam Location:  Inpatient Procedure: Transesophageal Echo, Cardiac Doppler, Color Doppler and Saline            Contrast Bubble Study Indications:     Bacteremia  History:         Patient has prior history of Echocardiogram examinations, most                  recent 05/21/2021.  Sonographer:     Clayton Lefort RDCS (AE) Referring Phys:  [id number] [facility] Diagnosing Phys: Eleonore Chiquito MD PROCEDURE: After discussion of the risks and benefits of a TEE, an informed consent was obtained from the patient. TEE procedure time was 12 minutes. The transesophogeal probe was passed without difficulty through the esophogus of the patient. Imaged were obtained with the patient in a left lateral decubitus position. Local oropharyngeal anesthetic was provided with Cetacaine. Sedation performed by different physician. The patient was monitored while under deep sedation. Anesthestetic sedation was provided intravenously by Anesthesiology: 140mg  of Propofol. Image quality was excellent. The patient's vital signs; including heart rate, blood pressure, and oxygen saturation; remained stable throughout the procedure. The patient developed no complications  during the procedure. IMPRESSIONS  1. Left ventricular ejection fraction, by estimation, is 60 to 65%. The left ventricle has normal function. The left ventricle has no regional wall motion  abnormalities.  2. Right ventricular systolic function is normal. The right ventricular size is normal.  3. No left atrial/left atrial appendage thrombus was detected. The LAA emptying velocity was 69 cm/s.  4. The mitral valve is normal in structure. Trivial mitral valve regurgitation. No evidence of mitral stenosis.  5. The aortic valve is tricuspid. There is mild calcification of the aortic valve. Aortic valve regurgitation is mild. Aortic valve sclerosis is present, with no evidence of aortic valve stenosis.  6. Aortic dilatation noted. There is moderate dilatation of the aortic root, measuring 44 mm. There is mild (Grade II) layered plaque involving the descending aorta.  7. Agitated saline contrast bubble study was negative, with no evidence of any interatrial shunt. Conclusion(s)/Recommendation(s): No evidence of vegetation/infective endocarditis on this transesophageael echocardiogram. FINDINGS  Left Ventricle: Left ventricular ejection fraction, by estimation, is 60 to 65%. The left ventricle has normal function. The left ventricle has no regional wall motion abnormalities. The left ventricular internal cavity size was normal in size. Right Ventricle: The right ventricular size is normal. No increase in right ventricular wall thickness. Right ventricular systolic function is normal. Left Atrium: Left atrial size was normal in size. No left atrial/left atrial appendage thrombus was detected. The LAA emptying velocity was 69 cm/s. Right Atrium: Right atrial size was normal in size. Pericardium: There is no evidence of pericardial effusion. Mitral Valve: The mitral valve is normal in structure. Trivial mitral valve regurgitation. No evidence of mitral valve stenosis. There is no evidence of mitral valve vegetation. Tricuspid Valve: The tricuspid valve is normal in structure. Tricuspid valve regurgitation is trivial. No evidence of tricuspid stenosis. There is no evidence of tricuspid valve vegetation. Aortic  Valve: The aortic valve is tricuspid. There is mild calcification of the aortic valve. Aortic valve regurgitation is mild. Aortic valve sclerosis is present, with no evidence of aortic valve stenosis. There is no evidence of aortic valve vegetation. Pulmonic Valve: The pulmonic valve was grossly normal. Pulmonic valve regurgitation is not visualized. No evidence of pulmonic stenosis. There is no evidence of pulmonic valve vegetation. Aorta: Aortic dilatation noted. There is moderate dilatation of the aortic root, measuring 44 mm. There is mild (Grade II) layered plaque involving the descending aorta. Venous: The left upper pulmonary vein, left lower pulmonary vein, right upper pulmonary vein and right lower pulmonary vein are normal. IAS/Shunts: The atrial septum is grossly normal. Agitated saline contrast was given intravenously to evaluate for intracardiac shunting. Agitated saline contrast bubble study was negative, with no evidence of any interatrial shunt.   AORTA Ao Root diam: 4.40 cm Ao Asc diam:  3.85 cm Eleonore Chiquito MD Electronically signed by Eleonore Chiquito MD Signature Date/Time: 05/25/2021/3:10:42 PM    Final      LOS: 11 days   Signature  Lala Lund M.D on 05/27/2021 at 11:08 AM   -  To page go to [email or web address]

## 2021-05-27 NOTE — Transfer of Care (Signed)
Immediate Anesthesia Transfer of Care Note  Patient: Kathleen [name]  Procedure(s) Performed: IRRIGATION AND DEBRIDEMENT OF WRIST (Left)  Patient Location: PACU  Anesthesia Type:General  Level of Consciousness: awake, alert  and oriented  Airway & Oxygen Therapy: Patient Spontanous Breathing  Post-op Assessment: Report given to RN and Post -op Vital signs reviewed and stable  Post vital signs: Reviewed and stable  Last Vitals:  Vitals Value Taken Time  BP 163/97 05/27/21 1923  Temp    Pulse    Resp 16 05/27/21 1924  SpO2    Vitals shown include unvalidated device data.  Last Pain:  Vitals:   05/27/21 0800  TempSrc:   PainSc: 10-Worst pain ever      Patients Stated Pain Goal: 3 (98/26/41 5830)  Complications: No notable events documented.

## 2021-05-27 NOTE — Progress Notes (Signed)
Spoke to [place] at [facility] this am to provide update that snf placement is pending. CSW inquired today if out-pt clinic can be changed to [facility] if needed. Advised CSW that is possible. CSW to advise navigator if that is needed. Will assist as needed.   Melven Sartorius Renal Navigator [phone]

## 2021-05-28 ENCOUNTER — Encounter (HOSPITAL_COMMUNITY): Payer: Self-pay | Admitting: Orthopedic Surgery

## 2021-05-28 DIAGNOSIS — R627 Adult failure to thrive: Secondary | ICD-10-CM | POA: Diagnosis not present

## 2021-05-28 LAB — CBC WITH DIFFERENTIAL/PLATELET
Abs Immature Granulocytes: 0.08 10*3/uL — ABNORMAL HIGH (ref 0.00–0.07)
Basophils Absolute: 0 10*3/uL (ref 0.0–0.1)
Basophils Relative: 0 %
Eosinophils Absolute: 0 10*3/uL (ref 0.0–0.5)
Eosinophils Relative: 0 %
HCT: 30.8 % — ABNORMAL LOW (ref 36.0–46.0)
Hemoglobin: 9.6 g/dL — ABNORMAL LOW (ref 12.0–15.0)
Immature Granulocytes: 1 %
Lymphocytes Relative: 7 %
Lymphs Abs: 0.7 10*3/uL (ref 0.7–4.0)
MCH: 28.1 pg (ref 26.0–34.0)
MCHC: 31.2 g/dL (ref 30.0–36.0)
MCV: 90.1 fL (ref 80.0–100.0)
Monocytes Absolute: 0.4 10*3/uL (ref 0.1–1.0)
Monocytes Relative: 4 %
Neutro Abs: 9.4 10*3/uL — ABNORMAL HIGH (ref 1.7–7.7)
Neutrophils Relative %: 88 %
Platelets: 339 10*3/uL (ref 150–400)
RBC: 3.42 MIL/uL — ABNORMAL LOW (ref 3.87–5.11)
RDW: 19.3 % — ABNORMAL HIGH (ref 11.5–15.5)
WBC: 10.5 10*3/uL (ref 4.0–10.5)
nRBC: 0 % (ref 0.0–0.2)

## 2021-05-28 LAB — COMPREHENSIVE METABOLIC PANEL
ALT: 9 U/L (ref 0–44)
AST: 16 U/L (ref 15–41)
Albumin: 1.9 g/dL — ABNORMAL LOW (ref 3.5–5.0)
Alkaline Phosphatase: 64 U/L (ref 38–126)
Anion gap: 11 (ref 5–15)
BUN: 57 mg/dL — ABNORMAL HIGH (ref 6–20)
CO2: 25 mmol/L (ref 22–32)
Calcium: 8.2 mg/dL — ABNORMAL LOW (ref 8.9–10.3)
Chloride: 95 mmol/L — ABNORMAL LOW (ref 98–111)
Creatinine, Ser: 6.93 mg/dL — ABNORMAL HIGH (ref 0.44–1.00)
GFR, Estimated: 6 mL/min — ABNORMAL LOW (ref >60)
Glucose, Bld: 104 mg/dL — ABNORMAL HIGH (ref 70–99)
Potassium: 5.3 mmol/L — ABNORMAL HIGH (ref 3.5–5.1)
Sodium: 131 mmol/L — ABNORMAL LOW (ref 135–145)
Total Bilirubin: 0.2 mg/dL — ABNORMAL LOW (ref 0.3–1.2)
Total Protein: 6.4 g/dL — ABNORMAL LOW (ref 6.5–8.1)

## 2021-05-28 LAB — PROCALCITONIN: Procalcitonin: 1.52 ng/mL

## 2021-05-28 LAB — MAGNESIUM: Magnesium: 2.5 mg/dL — ABNORMAL HIGH (ref 1.7–2.4)

## 2021-05-28 LAB — C-REACTIVE PROTEIN: CRP: 8.9 mg/dL — ABNORMAL HIGH (ref <1.0)

## 2021-05-28 MED ORDER — NEPRO/CARBSTEADY PO LIQD
237.0000 mL | Freq: Two times a day (BID) | ORAL | Status: DC
  Administered 2021-05-29 – 2021-06-02 (×5): 237 mL via ORAL

## 2021-05-28 NOTE — Progress Notes (Signed)
Nephrology Follow-Up Consult note   Assessment/Recommendations: Kathleen [name] is a/an 61 y.o. female with a past medical history significant for ESRD, admitted for COVID, strep pneumo bacteremia.     OP HD: [name]   3h 59min  50.8kg 350/1.5  2/2 bath F160  LUE AVG    - last post weight 1/3 was 48.2 kg  - sensipar 60 mg three times a week  - hectorol 6 mcg IV tiw  - venofer 100 mg weekly  - mircera 75 mcg q 2wks last 12/20, held 1/3 due to [place]  ESRD: HD on TTS schedule.  Strep pneumo bacteremia: On IV antibiotics.  Followed by ID.  TEE negative.  COVID-pneumonia: s/p treatment by primary team  EDW/BP: Under dry weight intermittently, slightly volume up at this time.  Continue to remove fluid as able.  GI bleeding: intermittent hematochezia. Outpatient endoscopic eval. CTM per primary  Anemia of CKD: Hgb 9.6, no esa currently but will consider. No iron given infection  Secondary hyperparathyroidism: Continue Hectorol and Sensipar.  Phos has been low. Hold binder  SLE: on prednisone per primary  Wrist swelling: with concern of septic arthritis vs auto immune related. S/p washout. F/u cultures.   Recommendations conveyed to primary service.    [facility] 05/28/2021 10:52 AM  ___________________________________________________________  CC: ESRD, COVID, strep pneumo bacteremia  Interval History/Subjective: Patient feeling very tired and somewhat out of it today.  Underwent exploration and drainage of her left wrist yesterday.  No other specific complaints.   Medications:  Current Facility-Administered Medications  Medication Dose Route Frequency Provider Last Rate Last Admin   0.9 %  sodium chloride infusion  250 mL Intravenous PRN [name], [name], MD       acetaminophen (TYLENOL) tablet 650 mg  650 mg Oral Q6H PRN Geralynn Rile, MD   650 mg at 05/20/21 3500   Or   acetaminophen (TYLENOL) suppository 650 mg  650 mg  Rectal Q6H PRN Geralynn Rile, MD       amLODipine (NORVASC) tablet 10 mg  10 mg Oral Daily [name], [name], MD   10 mg at 05/28/21 1010   carvedilol (COREG) tablet 3.125 mg  3.125 mg Oral BID WC Lala Lund K, MD   3.125 mg at 05/28/21 1010   ceFAZolin (ANCEF) IVPB 2g/100 mL premix  2 g Intravenous Q T,Th,Sat-1800 Laurice Record, MD       Chlorhexidine Gluconate Cloth 2 % PADS 6 each  6 each Topical Q0600 [name], [name], MD   6 each at 05/28/21 1011   cinacalcet (SENSIPAR) tablet 60 mg  60 mg Oral Q T,Th,Sa-HD Geralynn Rile, MD   60 mg at 05/26/21 2011   doxercalciferol (HECTOROL) injection 6 mcg  6 mcg Intravenous Q T,Th,Sa-HD Geralynn Rile, MD   6 mcg at 05/26/21 2012   famotidine (PEPCID) tablet 20 mg  20 mg Oral Daily Geralynn Rile, MD   20 mg at 05/28/21 1010   feeding supplement (NEPRO CARB STEADY) liquid 237 mL  237 mL Oral TID BM Geralynn Rile, MD   237 mL at 05/28/21 1011   heparin injection 5,000 Units  5,000 Units Subcutaneous Q8H Geralynn Rile, MD   5,000 Units at 05/28/21 0552   hydrALAZINE (APRESOLINE) injection 10 mg  10 mg Intravenous Q6H PRN Geralynn Rile, MD   10 mg at 05/25/21 1101   multivitamin (RENA-VIT) tablet 1 tablet  1 tablet Oral QHS  Geralynn Rile, MD   1 tablet at 05/28/21 0132   naloxone [facility]) injection 0.4 mg  0.4 mg Intravenous PRN Geralynn Rile, MD       nicotine (NICODERM CQ - dosed in mg/24 hours) patch 14 mg  14 mg Transdermal Daily [name], [name], MD   14 mg at 05/28/21 1011   oxyCODONE (Oxy IR/ROXICODONE) immediate release tablet 10 mg  10 mg Oral Q4H PRN Geralynn Rile, MD   10 mg at 05/28/21 1010   predniSONE (DELTASONE) tablet 40 mg  40 mg Oral Q breakfast Thurnell Lose, MD   40 mg at 05/28/21 1010   sodium chloride flush (NS) 0.9 % injection 3 mL  3 mL Intravenous Q12H Geralynn Rile, MD   3 mL at 05/27/21 1101   sodium chloride flush (NS) 0.9  % injection 3 mL  3 mL Intravenous PRN Geralynn Rile, MD          Review of Systems: 10 systems reviewed and negative except per interval history/subjective  Physical Exam: Vitals:   05/28/21 0600 05/28/21 0748  BP: (!) 151/91 131/85  Pulse: (!) 104 78  Resp: 18 18  Temp: (!) 97.5 F (36.4 C) (!) 97.3 F (36.3 C)  SpO2: 100% 93%   No intake/output data recorded.  Intake/Output Summary (Last 24 hours) at 05/28/2021 1052 Last data filed at 05/27/2021 1958 Gross per 24 hour  Intake 500 ml  Output 5 ml  Net 495 ml   Constitutional: well-appearing, no acute distress ENMT: ears and nose without scars or lesions, MMM CV: normal rate, left wrist covered in bandage Respiratory: bilateral chest rise, normal work of breathing Gastrointestinal: soft, no distension Skin: no visible lesions or rashes Psych: alert, appropriate mood and affect   Test Results I personally reviewed new and old clinical labs and radiology tests Lab Results  Component Value Date   NA 131 (L) 05/28/2021   K 5.3 (H) 05/28/2021   CL 95 (L) 05/28/2021   CO2 25 05/28/2021   BUN 57 (H) 05/28/2021   CREATININE 6.93 (H) 05/28/2021   CALCIUM 8.2 (L) 05/28/2021   ALBUMIN 1.9 (L) 05/28/2021   PHOS 2.3 (L) 05/24/2021

## 2021-05-28 NOTE — Progress Notes (Signed)
PROGRESS NOTE        PATIENT DETAILS Name: Kathleen [name] Age: 61 y.o. Sex: female Date of Birth: 05-07-1961 Admit Date: 05/15/2021 Admitting Physician Orma Flaming, MD OIZ:TIWPY, Beverly Gust, NP  Brief Narrative:  Patient is a 61 y.o. female with history of ESRD on HD TTS, HFrEF, SLE, anemia due to CKD, HTN-who presented with at least 1 week history of worsening weakness/lethargy, intermittent diarrhea, hematochezia-she had missed her last 3 HD sessions prior to hospitalization due to weakness/lethargy.  She was found to have COVID-19 infection-and thought to have uremic encephalopathy in the setting of missed HD.  She stabilized with Remdesivir and hemodialysis.  Unfortunately-Hospital course was complicated by severe arthralgias mostly in her left wrist/spine-low-grade fever-she was subsequently found to have streptococcal pneumonia bacteremia.  See below for further details.  Significant Hospital events 1/06>> admit with diarrhea/intermittent hematochezia from COVID-uremic encephalopathy from missed HD. 1/10>> developed arthralgias-mostly in her left wrist.Prednisone ordered 1/11>> low-grade fever-worsening left wrist arthralgia-CT left wrist negative for effusion.   1/11>> blood culture positive Streptococcus pneumoniae-started on IV Rocephin. 1/19 Dr Apolonio Schneiders - Left wrist radiocarpal and midcarpal joint arthrotomy exploration and drainage  Subjective:  Patient in bed, appears comfortable, denies any headache, no fever, no chest pain or pressure, no shortness of breath , no abdominal pain. No focal weakness.  Improved left wrist pain.  Objective: Vitals: Blood pressure 131/85, pulse 78, temperature (!) 97.3 F (36.3 C), temperature source Oral, resp. rate 18, height 5\' 4"  (1.626 m), weight 54.3 kg, SpO2 93 %.   Exam:  Awake Alert, No new F.N deficits, Normal affect [place].AT,PERRAL Supple Neck, No JVD,   Symmetrical Chest wall movement, Good air movement  bilaterally, CTAB RRR,No Gallops, Rubs or new Murmurs,  +ve B.Sounds, Abd Soft, No tenderness,   No Cyanosis, Clubbing or edema  Left hand and wrist along with forearm under bandage.    Assessment/Plan:  COVID-19 infection: Improved-diarrhea has resolved-no PNA on CXR-completed 3 days of Remdesivir   Diarrhea with intermittent hematochezia: Diarrhea due to COVID-19 infection-suspect hematochezia was in the setting of some mucosal sloughing.  Hematochezia and diarrhea have resolved.  Given significant debility/deconditioning/stable hemoglobin-suspect we can postpone endoscopic evaluation to the outpatient setting.  Discussed this with patient-she agrees.    Acute metabolic encephalopathy: Resolved.  Due to uremia in the setting of missed HD with some contribution from COVID-19.    Streptococcal pneumonia with bacteremia: Developed low-grade fever/left wrist arthralgia/back pain on 1/10-1/11-blood cultures are positive for Streptococcus pneumonia.  No PNA on CXR.  TTE without vegetations.  MRI with suspicious septic arthritis at L4-L5 level-Per IR-this is not amenable to drainage.  TEE stable on 05/25/2021.  Currently on IV cefazolin per ID.  Repeat blood cultures drawn on 05/21/2021 are negative so far.  Case discussed with neurosurgeon Dr. Kathyrn Sheriff including the MRI findings, does not think these findings are of any significance.  No further work-up for this issue of nonspecific MRI changes.   Left wrist arthralgia: Slowly improving after initiation of steroids on 1/10 and IV antibiotics on 1/11.  Although she has a history of lupus with positive ANA-dsDNA level/complement levels are not suggestive of lupus flare.  Pain improved on steroids, CT left wrist unremarkable, wrist on exam is tender to touch with limited active and passive range of motion due to pain MRI of the left wrist was  done which was suspicious for septic arthritis she was seen by orthopedic surgeon Dr. Apolonio Schneiders and underwent Left  wrist radiocarpal and midcarpal joint arthrotomy exploration and drainage on 05/27/2021.  Clinically improving now continue to monitor with IV antibiotics.  Follow culture results from the joint washout, continue cefazolin tentative stop date 07/01/21.  SLE: Claims to have a longstanding history of lupus-followed by rheumatology at [facility].  Per patient-her Plaquenil was discontinued by her nephrologist several months ago-I currently on oral prednisone 40 mg daily, plan is post discharge follow-up with her rheumatologist at [facility].  ESRD: Nephrology following-we will defer hemodialysis and electrolyte management to nephrology service.  Chronic systolic heart failure (EF 45-50% on 03/13/2021): Volume status is stable.  Watch closely.  HTN: BP high on Norvasc have added Coreg and as needed hydralazine.  Prolonged QTC: Appears to have develops some amount of QTC elongation over the past few months-repeat EKG on 1/11 and 1/13 with stable/normalized QTC.    We will attempt to keep K> 4, and Mg>2.  Monitor EKGs periodically-continue telemetry monitoring.    Tobacco abuse: Continue transdermal nicotine  LLL Nodule - outpt Pulm follow up, within a month of DC.  Deconditioning/debility: Likely due to COVID-19 infection-and worsening uremia from missed HD.  Hopefully will continue to improve with further hemodialysis-improvement in underlying COVID-19 infection.  Evaluated by PT services-felt to require SNF on discharge.  Social worker following.   BMI Estimated body mass index is 20.55 kg/m as calculated from the following:   Height as of this encounter: 5\' 4"  (1.626 m).   Weight as of this encounter: 54.3 kg.     Consults: ID, nephrology.  Neurosurgeon Dr. Kathyrn Sheriff on 05/24/2021 over the phone, orthopedics DVT Prophylaxis: SQ heparin Code Status:Full code  Family Communication: None at bedside  Disposition Plan: Status is: Inpatient  Severe debility/deconditioning-now  with worsening encephalopathy due to uremia-needs hemodialysis today.  On IV Remdesivir for COVID.  SNF planned on discharge.  Diet: Diet Order             Diet regular Room service appropriate? Yes; Fluid consistency: Thin  Diet effective now                    MEDICATIONS: Scheduled Meds:  amLODipine  10 mg Oral Daily   carvedilol  3.125 mg Oral BID WC   Chlorhexidine Gluconate Cloth  6 each Topical Q0600   cinacalcet  60 mg Oral Q T,Th,Sa-HD   doxercalciferol  6 mcg Intravenous Q T,Th,Sa-HD   famotidine  20 mg Oral Daily   feeding supplement (NEPRO CARB STEADY)  237 mL Oral TID BM   heparin injection (subcutaneous)  5,000 Units Subcutaneous Q8H   multivitamin  1 tablet Oral QHS   nicotine  14 mg Transdermal Daily   predniSONE  40 mg Oral Q breakfast   sodium chloride flush  3 mL Intravenous Q12H   Continuous Infusions:  sodium chloride      ceFAZolin (ANCEF) IV     PRN Meds:.sodium chloride, acetaminophen **OR** acetaminophen, hydrALAZINE, naLOXone (NARCAN)  injection, oxyCODONE, sodium chloride flush   I have personally reviewed following labs and imaging studies  LABORATORY DATA:  Recent Labs  Lab 05/24/21 0119 05/25/21 0029 05/26/21 0252 05/27/21 0116 05/28/21 0204  WBC 11.2* 12.5* 12.1* 9.3 10.5  HGB 8.9* 10.0* 9.2* 9.3* 9.6*  HCT 27.7* 31.7* 28.3* 28.3* 30.8*  PLT 214 235 254 288 339  MCV 86.6 89.5 87.6 87.1 90.1  MCH 27.8 28.2 28.5 28.6 28.1  MCHC 32.1 31.5 32.5 32.9 31.2  RDW 19.1* 19.3* 19.3* 19.2* 19.3*  LYMPHSABS  --  1.2 0.8 1.4 0.7  MONOABS  --  1.0 0.6 0.8 0.4  EOSABS  --  0.0 0.0 0.0 0.0  BASOSABS  --  0.0 0.0 0.0 0.0    Recent Labs  Lab 05/24/21 0119 05/24/21 0728 05/25/21 0029 05/26/21 0252 05/27/21 0116 05/28/21 0427  NA 131*  --  133* 132* 133* 131*  K 4.6  --  4.3 6.0* 3.6 5.3*  CL 93*  --  95* 97* 94* 95*  CO2 27  --  30 23 28 25   GLUCOSE 98  --  100* 94 100* 104*  BUN 91*  --  51* 73* 37* 57*  CREATININE 8.60*  --   5.73* 7.65* 4.92* 6.93*  CALCIUM 8.7*  --  9.0 8.5* 8.1* 8.2*  AST  --   --  18 15 20 16   ALT  --   --  12 11 12 9   ALKPHOS  --   --  70 63 58 64  BILITOT  --   --  0.4 0.4 0.4 0.2*  ALBUMIN 1.8*  --  1.9* 1.8* 1.9* 1.9*  MG 2.8*  --  2.5* 2.5* 2.2 2.5*  CRP  --   --  7.1* 9.6* 7.0* 8.9*  PROCALCITON  --  6.99 4.20 2.77 1.79 1.52    RADIOLOGY STUDIES/RESULTS: MR WRIST LEFT WO CONTRAST  Result Date: 05/27/2021 CLINICAL DATA:  COVID related encephalopathy with progressive left wrist pain over the last week. History of end-stage renal disease and systemic lupus erythematosus. Bacteremia. EXAM: MR OF THE LEFT WRIST WITHOUT CONTRAST TECHNIQUE: Multiplanar, multisequence MR imaging of the left wrist was performed. No intravenous contrast was administered. COMPARISON:  Radiographs and CT 05/20/2021. FINDINGS: Ligaments: The scapholunate and lunotriquetral ligaments appear intact. Triangular fibrocartilage: The triangular fibrocartilage complex appears intact. The ulnar variance is neutral. Tendons: The flexor and extensor tendons appear unremarkable. No significant tenosynovitis identified. Carpal tunnel/median nerve: Unremarkable. Guyon's canal: Unremarkable. Joint/cartilage: Moderate to large radiocarpal and midcarpal joint effusions, not clearly demonstrated on previous CT. No significant fluid within the distal radioulnar joint. Bones/carpal alignment: The carpal bone alignment is normal.Patchy subchondral edema within the scaphoid, lunate, triquetrum and hamate bone. There are possible small erosions. No cortical destruction or acute fracture identified. Other: Generalized subcutaneous edema surrounding the wrist, nonspecific, although suspicious for cellulitis. No focal fluid collections identified. IMPRESSION: 1. Moderate to large radiocarpal and midcarpal joint effusions with nonspecific patchy subchondral edema and possible erosions in the carpal bones. In this clinical context, these findings are  suspicious for septic arthritis and possible early osteomyelitis. Joint aspiration recommended. 2. In addition, there is generalized subcutaneous edema surrounding the wrist which is nonspecific, although suggestive of cellulitis. 3. No focal drainable soft tissue fluid collection or tenosynovitis. The wrist ligaments appear intact. 4. These results will be called to the ordering clinician or representative by the Radiologist Assistant, and communication documented in the PACS or [employer]. Electronically Signed   By: Richardean Sale M.D.   On: 05/27/2021 15:47     LOS: 12 days   Signature  Lala Lund M.D on 05/28/2021 at 10:16 AM   -  To page go to [email or web address]

## 2021-05-28 NOTE — Progress Notes (Signed)
Physical Therapy Treatment Patient Details Name: Kathleen [name] MRN: [id number] DOB: Mar 19, 1961 Today's Date: 05/28/2021   History of Present Illness Pt is a 61 y.o. female who presented 05/15/21 with weakness. Pt with GI bleed. work up underway, but current diagnosis:  FTT, chronic combined systolic/diastolic HF, COVID -19  PMH: ESRD on HD, COPD, HTN, migraine, lupus, seziures, anemia, cardiac arrest hx, cellulitis, cerebral vasculitis, hx of suicide attempt, TB, TIA    PT Comments    Pt admitted with above diagnosis. Pt progressing.  Pt met 0/6 goals but is making progress toward goals. Goals revised.  Still needs SNF for rehab prior to going home where she must be independent.  Pt currently with functional limitations due to balance and endurance deficits. Pt will benefit from skilled PT to increase their independence and safety with mobility to allow discharge to the venue listed below.      Recommendations for follow up therapy are one component of a multi-disciplinary discharge planning process, led by the attending physician.  Recommendations may be updated based on patient status, additional functional criteria and insurance authorization.  Follow Up Recommendations  Skilled nursing-short term rehab (<3 hours/day)     Assistance Recommended at Discharge Intermittent Supervision/Assistance  Patient can return home with the following A little help with walking and/or transfers;A little help with bathing/dressing/bathroom;Assistance with cooking/housework;Assist for transportation;Help with stairs or ramp for entrance   Equipment Recommendations  Rolling walker (2 wheels);BSC/3in1    Recommendations for Other Services       Precautions / Restrictions Precautions Precautions: Fall Precaution Comments: monitor vitals, COVID Restrictions Weight Bearing Restrictions: No     Mobility  Bed Mobility Overal bed mobility: Needs Assistance Bed Mobility: Sit to Sidelying Rolling:  Min guard Sidelying to sit: Min guard Supine to sit: Min guard     General bed mobility comments: Needed only a little assist    Transfers Overall transfer level: Needs assistance Equipment used: 1 person hand held assist Transfers: Sit to/from Stand Sit to Stand: Min guard           General transfer comment: steadying assist    Ambulation/Gait Ambulation/Gait assistance: Min assist Gait Distance (Feet): 280 Feet Assistive device: 1 person hand held assist Gait Pattern/deviations: Decreased stride length, Drifts right/left, Step-through pattern Gait velocity: reduced Gait velocity interpretation: <1.31 ft/sec, indicative of household ambulator   General Gait Details: Pt was able to ambulate but could not use RW due to left UE pain. Pt held onto PT with right Hand and was able to ambulate incr distance with unsteadiness on feet at times with definite need for HHA.  Fatigued at end of walk. Cannot accept challenge to balance.   Stairs             Wheelchair Mobility    Modified Rankin (Stroke Patients Only)       Balance Overall balance assessment: Needs assistance Sitting-balance support: No upper extremity supported, Feet supported Sitting balance-Leahy Scale: Fair Sitting balance - Comments: Static sitting EOB with min guard for safety   Standing balance support: Single extremity supported Standing balance-Leahy Scale: Poor Standing balance comment: relies on at least 1 UE support.                            Cognition Arousal/Alertness: Awake/alert Behavior During Therapy: WFL for tasks assessed/performed Overall Cognitive Status: Impaired/Different from baseline Area of Impairment: Problem solving  Safety/Judgement: Decreased awareness of safety   Problem Solving: Slow processing General Comments: Pt following commands without difficulty. Pt continues to  demonstrate impulsivity.        Exercises  General Exercises - Lower Extremity Long Arc Quad: AROM, Both, 10 reps, Seated Heel Slides: AAROM, PROM, Both, 5 reps, Supine    General Comments        Pertinent Vitals/Pain Pain Assessment Pain Assessment: Faces Faces Pain Scale: Hurts little more Breathing: normal Negative Vocalization: none Facial Expression: smiling or inexpressive Body Language: relaxed Consolability: no need to console PAINAD Score: 0 Pain Location: left hand/wrist Pain Descriptors / Indicators: Grimacing, Guarding Pain Intervention(s): Limited activity within patient's tolerance, Monitored during session, Repositioned    Home Living                          Prior Function            PT Goals (current goals can now be found in the care plan section) Acute Rehab PT Goals Patient Stated Goal: to find a new home PT Goal Formulation: With patient Time For Goal Achievement: 06/11/21 Potential to Achieve Goals: Fair Progress towards PT goals: Progressing toward goals    Frequency    Min 2X/week      PT Plan Current plan remains appropriate    Co-evaluation              AM-PAC PT "6 Clicks" Mobility   Outcome Measure  Help needed turning from your back to your side while in a flat bed without using bedrails?: A Lot Help needed moving from lying on your back to sitting on the side of a flat bed without using bedrails?: A Lot Help needed moving to and from a bed to a chair (including a wheelchair)?: A Lot Help needed standing up from a chair using your arms (e.g., wheelchair or bedside chair)?: A Little Help needed to walk in hospital room?: A Little Help needed climbing 3-5 steps with a railing? : Total 6 Click Score: 13    End of Session Equipment Utilized During Treatment: Gait belt Activity Tolerance: Patient limited by fatigue;Patient limited by pain Patient left: with call bell/phone within reach;in chair;with chair alarm set Nurse Communication: Mobility status PT  Visit Diagnosis: Unsteadiness on feet (R26.81);Other abnormalities of gait and mobility (R26.89);Muscle weakness (generalized) (M62.81);History of falling (Z91.81);Difficulty in walking, not elsewhere classified (R26.2);Pain Pain - part of body:  (abdomen)     Time: 1610-9604 PT Time Calculation (min) (ACUTE ONLY): 23 min  Charges:  $Gait Training: 8-22 mins $Therapeutic Exercise: 8-22 mins                     Juanjose Mojica M,PT Acute Rehab Services [phone] (pager)    Bevelyn Buckles 05/28/2021, 2:23 PM

## 2021-05-28 NOTE — Progress Notes (Signed)
Nutrition Follow-up  DOCUMENTATION CODES:   Underweight, Non-severe (moderate) malnutrition in context of chronic illness  INTERVENTION:   Encourage good PO intake Continue Renal Multivitamin w/ minerals daily Decrease Nepro Shake to po BID, each supplement provides 425 kcal and 19 grams protein  NUTRITION DIAGNOSIS:   Moderate Malnutrition related to chronic illness (ESRD, CHF, COPD) as evidenced by severe muscle depletion, moderate fat depletion. - Ongoing  GOAL:   Patient will meet greater than or equal to 90% of their needs - Ongoing  MONITOR:   PO intake, Supplement acceptance, Labs, Weight trends  REASON FOR ASSESSMENT:   Consult Assessment of nutrition requirement/status, Poor PO  ASSESSMENT:   61 y.o. female presented to the ED with rectal bleeding x3-5 days, poor PO intake, weakness, and fatigue. Pt reports missing at least the last 3 dialysis treatments due to not feeling well. PMH includes ESRD on HD (TTS), CHF, HTN, COPD, Lupus, and TIA. Pt admitted with failure to thrive and rectal bleeding.   01/18 - L wrist joint arthrotomy exploration and drainage  Pt reports that she is doing well. Pt currently eating lunch during RD visit. Pt reports that she is currently drinking 1-2 Nepro shakes per day. Pt denies any nausea or vomiting.  Per EMR, pt intake includes: 01/17 - Breakfast 100%, Lunch 75%  Medications reviewed and include: Cinacalcet, Pepcid, Rena-Vit, Prednisone, IV antibiotic Labs reviewed:  - Sodium 131  - Potassium 5.3 - Magnesium 2.5  HD on 01/17 EDW: 50.8 kg Net UF: 1000 mL Post Dialysis weight: 50 kg Current weight: 54.3 kg  Diet Order:   Diet Order             Diet regular Room service appropriate? Yes; Fluid consistency: Thin  Diet effective now                   EDUCATION NEEDS:   No education needs have been identified at this time  Skin:  Skin Assessment: Reviewed RN Assessment (MASD - Buttocks)  Last BM:   01/17  Height:   Ht Readings from Last 1 Encounters:  05/25/21 5\' 4"  (1.626 m)    Weight:   Wt Readings from Last 1 Encounters:  05/28/21 54.3 kg    Ideal Body Weight:  54.6 kg  BMI:  Body mass index is 20.55 kg/m.  Estimated Nutritional Needs:  Kcal:  1600-1800 Protein:  80-95 grams Fluid:  UOP + 1L    Mustafa Potts Louie Casa, RD, LDN Clinical Dietitian See [facility] for contact information.

## 2021-05-28 NOTE — Progress Notes (Signed)
Contacted CSW to f/u on snf search and if there is a need to assist with out-pt HD needs at d/c. Will await response.   Melven Sartorius Renal Navigator [phone]

## 2021-05-29 DIAGNOSIS — R627 Adult failure to thrive: Secondary | ICD-10-CM | POA: Diagnosis not present

## 2021-05-29 LAB — PHOSPHORUS: Phosphorus: 3.2 mg/dL (ref 2.5–4.6)

## 2021-05-29 MED ORDER — CEFAZOLIN SODIUM-DEXTROSE 1-4 GM/50ML-% IV SOLN
1.0000 g | INTRAVENOUS | Status: DC
  Administered 2021-05-29 – 2021-06-01 (×4): 1 g via INTRAVENOUS
  Filled 2021-05-29 (×6): qty 50

## 2021-05-29 NOTE — Progress Notes (Addendum)
Antimicrobial Stewardship Pharmacy Note  Assessment: 79 YOF with streptococcus pneumoniae bacteremia. Treating for 6 weeks.    Regimen: cefazolin 2 g IV every HD TTS   Stop date: 07/01/21   Please fax labs to [phone].

## 2021-05-29 NOTE — Progress Notes (Signed)
Occupational Therapy Treatment Patient Details Name: Kathleen [name] MRN: [id number] DOB: May 09, 1961 Today's Date: 05/29/2021   History of present illness Pt is a 61 y.o. female who presented 05/15/21 with weakness. Pt with GI bleed. work up underway, but current diagnosis:  FTT, chronic combined systolic/diastolic HF, COVID -35  PMH: ESRD on HD, COPD, HTN, migraine, lupus, seziures, anemia, cardiac arrest hx, cellulitis, cerebral vasculitis, hx of suicide attempt, TB, TIA   OT comments  Pt highly focused on getting someone to bring her cell phone to the hospital, but did ambulate with hand held assistance to bathroom for toileting and grooming. Donned front opening gown with min assist. Instructed pt in donning socks with R hand. Continues to be appropriate for SNF level rehab.    Recommendations for follow up therapy are one component of a multi-disciplinary discharge planning process, led by the attending physician.  Recommendations may be updated based on patient status, additional functional criteria and insurance authorization.    Follow Up Recommendations  Skilled nursing-short term rehab (<3 hours/day)    Assistance Recommended at Discharge Frequent or constant Supervision/Assistance  Patient can return home with the following  A little help with walking and/or transfers;A lot of help with bathing/dressing/bathroom   Equipment Recommendations  None recommended by OT    Recommendations for Other Services      Precautions / Restrictions Precautions Precautions: Fall Precaution Comments: COVID Required Braces or Orthoses: Splint/Cast Splint/Cast: L wrist splinted and ace wrapped Restrictions Weight Bearing Restrictions: No       Mobility Bed Mobility Overal bed mobility: Needs Assistance Bed Mobility: Supine to Sit, Sit to Supine     Supine to sit: Supervision Sit to supine: Supervision   General bed mobility comments: increased time, HOB up    Transfers Overall  transfer level: Needs assistance Equipment used: 1 person hand held assist Transfers: Sit to/from Stand Sit to Stand: Min guard           General transfer comment: steadying assist     Balance Overall balance assessment: Needs assistance   Sitting balance-Leahy Scale: Good     Standing balance support: Single extremity supported Standing balance-Leahy Scale: Poor Standing balance comment: relies on at least 1 UE support for dynamic balance                           ADL either performed or assessed with clinical judgement   ADL Overall ADL's : Needs assistance/impaired     Grooming: Wash/dry hands;Standing;Min guard           Upper Body Dressing : Minimal assistance;Sitting Upper Body Dressing Details (indicate cue type and reason): front opening gown   Lower Body Dressing Details (indicate cue type and reason): instructed pt in donning sock with R hand only Toilet Transfer: Minimal assistance;Ambulation;Comfort height toilet Toilet Transfer Details (indicate cue type and reason): hand held assist Toileting- Clothing Manipulation and Hygiene: Set up;Sitting/lateral lean       Functional mobility during ADLs: Minimal assistance (hand held)      Extremity/Trunk Assessment              Vision       Perception     Praxis      Cognition Arousal/Alertness: Awake/alert Behavior During Therapy: WFL for tasks assessed/performed Overall Cognitive Status: Impaired/Different from baseline Area of Impairment: Problem solving, Attention  Current Attention Level: Selective         Problem Solving: Slow processing, Requires verbal cues General Comments: pt highly focused on getting her phone from her friend's house        Exercises      Shoulder Instructions       General Comments      Pertinent Vitals/ Pain       Pain Assessment Pain Assessment: Faces Faces Pain Scale: Hurts little more Pain Location: left  hand/wrist Pain Descriptors / Indicators: Sore Pain Intervention(s): Monitored during session  Home Living                                          Prior Functioning/Environment              Frequency  Min 2X/week        Progress Toward Goals  OT Goals(current goals can now be found in the care plan section)  Progress towards OT goals: Progressing toward goals  Acute Rehab OT Goals OT Goal Formulation: With patient Time For Goal Achievement: 06/12/21 Potential to Achieve Goals: Good ADL Goals Pt Will Perform Grooming: with supervision;standing Pt Will Perform Lower Body Bathing: with supervision;sit to/from stand Pt Will Perform Lower Body Dressing: with supervision;sit to/from stand Pt Will Transfer to Toilet: with supervision;ambulating;regular height toilet Pt Will Perform Toileting - Clothing Manipulation and hygiene: with set-up;sit to/from stand  Plan Discharge plan remains appropriate    Co-evaluation                 AM-PAC OT "6 Clicks" Daily Activity     Outcome Measure   Help from another person eating meals?: A Little Help from another person taking care of personal grooming?: A Little Help from another person toileting, which includes using toliet, bedpan, or urinal?: A Little Help from another person bathing (including washing, rinsing, drying)?: A Lot Help from another person to put on and taking off regular upper body clothing?: A Little Help from another person to put on and taking off regular lower body clothing?: A Lot 6 Click Score: 16    End of Session Equipment Utilized During Treatment: Gait belt  OT Visit Diagnosis: Unsteadiness on feet (R26.81);Pain Pain - Right/Left: Left Pain - part of body: Arm   Activity Tolerance Patient tolerated treatment well   Patient Left in bed;with call bell/phone within reach;with bed alarm set   Nurse Communication          Time: 5638-9373 OT Time Calculation (min): 20  min  Charges: OT General Charges $OT Visit: 1 Visit OT Treatments $Self Care/Home Management : 8-22 mins  Nestor Lewandowsky, OTR/L Acute Rehabilitation Services Pager: [phone] Office: [phone]   [name] 05/29/2021, 11:02 AM

## 2021-05-29 NOTE — Plan of Care (Signed)
°  Problem: Clinical Measurements: Goal: Ability to maintain clinical measurements within normal limits will improve Outcome: Progressing Goal: Will remain free from infection Outcome: Progressing   Problem: Elimination: Goal: Will not experience complications related to bowel motility Outcome: Progressing

## 2021-05-29 NOTE — Social Work (Signed)
Pt is on day 14 of 21 days of covid precautions due be being immunocompromised. Pt has a bed when ready.   Emeterio Reeve, LCSW Clinical Social Worker

## 2021-05-29 NOTE — Progress Notes (Signed)
PROGRESS NOTE        PATIENT DETAILS Name: Kathleen [name] Age: 61 y.o. Sex: female Date of Birth: Apr 08, 1961 Admit Date: 05/15/2021 Admitting Physician Orma Flaming, MD EVO:JJKKX, Beverly Gust, NP  Brief Narrative:  Patient is a 61 y.o. female with history of ESRD on HD TTS, HFrEF, SLE, anemia due to CKD, HTN-who presented with at least 1 week history of worsening weakness/lethargy, intermittent diarrhea, hematochezia-she had missed her last 3 HD sessions prior to hospitalization due to weakness/lethargy.  She was found to have COVID-19 infection-and thought to have uremic encephalopathy in the setting of missed HD.  She stabilized with Remdesivir and hemodialysis.  Unfortunately-Hospital course was complicated by severe arthralgias mostly in her left wrist/spine-low-grade fever-she was subsequently found to have streptococcal pneumonia bacteremia.  See below for further details.  Significant Hospital events 1/06>> admit with diarrhea/intermittent hematochezia from COVID-uremic encephalopathy from missed HD. 1/10>> developed arthralgias-mostly in her left wrist.Prednisone ordered 1/11>> low-grade fever-worsening left wrist arthralgia-CT left wrist negative for effusion.   1/11>> blood culture positive Streptococcus pneumoniae-started on IV Rocephin. 1/19 Dr Apolonio Schneiders - Left wrist radiocarpal and midcarpal joint arthrotomy exploration and drainage  Subjective:  Patient in bed, appears comfortable, denies any headache, no fever, no chest pain or pressure, no shortness of breath , no abdominal pain. No new focal weakness. Improved left wrist pain.  Objective: Vitals: Blood pressure 119/72, pulse 83, temperature 98.8 F (37.1 C), resp. rate 18, height 5\' 4"  (1.626 m), weight 50.7 kg, SpO2 100 %.   Exam:  Awake Alert, No new F.N deficits, Normal affect [place].AT,PERRAL Supple Neck, No JVD,   Symmetrical Chest wall movement, Good air movement bilaterally, CTAB RRR,No  Gallops, Rubs or new Murmurs,  +ve B.Sounds, Abd Soft, No tenderness, Left hand and wrist along with forearm under bandage.    Assessment/Plan:  COVID-19 infection: Improved-diarrhea has resolved-no PNA on CXR-completed 3 days of Remdesivir, she is on 21 days of isolation due to her immunocompromise state from prolonged steroid use.  Diarrhea with intermittent hematochezia: Diarrhea due to COVID-19 infection-suspect hematochezia was in the setting of some mucosal sloughing.  Hematochezia and diarrhea have resolved.  Given significant debility/deconditioning/stable hemoglobin-suspect we can postpone endoscopic evaluation to the outpatient setting.  Discussed this with patient-she agrees.    Acute metabolic encephalopathy: Resolved.  Due to uremia in the setting of missed HD with some contribution from COVID-19.    Streptococcal pneumonia with bacteremia: Developed low-grade fever/left wrist arthralgia/back pain on 1/10-1/11-blood cultures are positive for Streptococcus pneumonia.  No PNA on CXR.  TTE without vegetations.  MRI with suspicious septic arthritis at L4-L5 level-Per IR-this is not amenable to drainage.  TEE stable on 05/25/2021.  Currently on IV cefazolin per ID.  Repeat blood cultures drawn on 05/21/2021 are negative so far.  Case discussed with neurosurgeon Dr. Kathyrn Sheriff including the MRI findings, does not think these findings are of any significance.  No further work-up for this issue of nonspecific MRI changes.   Left wrist arthralgia: Slowly improving after initiation of steroids on 1/10 and IV antibiotics on 1/11.  Although she has a history of lupus with positive ANA-dsDNA level/complement levels are not suggestive of lupus flare.  Pain improved on steroids, CT left wrist unremarkable, wrist on exam is tender to touch with limited active and passive range of motion due to pain MRI of the  left wrist was done which was suspicious for septic arthritis she was seen by orthopedic surgeon  Dr. Apolonio Schneiders and underwent Left wrist radiocarpal and midcarpal joint arthrotomy exploration and drainage on 05/27/2021.  Clinically improving now continue to monitor with IV antibiotics.  Follow culture results from the joint washout, continue cefazolin tentative stop date 07/01/21.  SLE: Claims to have a longstanding history of lupus-followed by rheumatology at [facility].  Per patient-her Plaquenil was discontinued by her nephrologist several months ago-I currently on oral prednisone 40 mg daily, plan is post discharge follow-up with her rheumatologist at [facility].  ESRD: Nephrology following-we will defer hemodialysis and electrolyte management to nephrology service.  Chronic systolic heart failure (EF 45-50% on 03/13/2021): Volume status is stable.  Watch closely.  HTN: BP high on Norvasc have added Coreg and as needed hydralazine.  Prolonged QTC: Appears to have develops some amount of QTC elongation over the past few months-repeat EKG on 1/11 and 1/13 with stable/normalized QTC.    We will attempt to keep K> 4, and Mg>2.  Monitor EKGs periodically-continue telemetry monitoring.    Tobacco abuse: Continue transdermal nicotine  LLL Nodule - outpt Pulm follow up, within a month of DC.  Deconditioning/debility: Likely due to COVID-19 infection-and worsening uremia from missed HD.  Hopefully will continue to improve with further hemodialysis-improvement in underlying COVID-19 infection.  Evaluated by PT services-felt to require SNF on discharge.  Social worker following.   BMI Estimated body mass index is 19.19 kg/m as calculated from the following:   Height as of this encounter: 5\' 4"  (1.626 m).   Weight as of this encounter: 50.7 kg.     Consults: ID, nephrology.  Neurosurgeon Dr. Kathyrn Sheriff on 05/24/2021 over the phone, orthopedics DVT Prophylaxis: SQ heparin Code Status:Full code  Family Communication: None at bedside  Disposition Plan: Status is:  Inpatient  Severe debility/deconditioning-now with worsening encephalopathy due to uremia-needs hemodialysis today.  On IV Remdesivir for COVID.  SNF planned on discharge.  Diet: Diet Order             Diet regular Room service appropriate? Yes; Fluid consistency: Thin  Diet effective now                    MEDICATIONS: Scheduled Meds:  amLODipine  10 mg Oral Daily   carvedilol  3.125 mg Oral BID WC   Chlorhexidine Gluconate Cloth  6 each Topical Q0600   cinacalcet  60 mg Oral Q T,Th,Sa-HD   doxercalciferol  6 mcg Intravenous Q T,Th,Sa-HD   famotidine  20 mg Oral Daily   feeding supplement (NEPRO CARB STEADY)  237 mL Oral BID BM   heparin injection (subcutaneous)  5,000 Units Subcutaneous Q8H   multivitamin  1 tablet Oral QHS   nicotine  14 mg Transdermal Daily   predniSONE  40 mg Oral Q breakfast   sodium chloride flush  3 mL Intravenous Q12H   Continuous Infusions:  sodium chloride      ceFAZolin (ANCEF) IV     PRN Meds:.sodium chloride, acetaminophen **OR** acetaminophen, hydrALAZINE, naLOXone (NARCAN)  injection, oxyCODONE, sodium chloride flush   I have personally reviewed following labs and imaging studies  LABORATORY DATA:  Recent Labs  Lab 05/24/21 0119 05/25/21 0029 05/26/21 0252 05/27/21 0116 05/28/21 0204  WBC 11.2* 12.5* 12.1* 9.3 10.5  HGB 8.9* 10.0* 9.2* 9.3* 9.6*  HCT 27.7* 31.7* 28.3* 28.3* 30.8*  PLT 214 235 254 288 339  MCV 86.6 89.5 87.6  87.1 90.1  MCH 27.8 28.2 28.5 28.6 28.1  MCHC 32.1 31.5 32.5 32.9 31.2  RDW 19.1* 19.3* 19.3* 19.2* 19.3*  LYMPHSABS  --  1.2 0.8 1.4 0.7  MONOABS  --  1.0 0.6 0.8 0.4  EOSABS  --  0.0 0.0 0.0 0.0  BASOSABS  --  0.0 0.0 0.0 0.0    Recent Labs  Lab 05/24/21 0119 05/24/21 0728 05/25/21 0029 05/26/21 0252 05/27/21 0116 05/28/21 0427  NA 131*  --  133* 132* 133* 131*  K 4.6  --  4.3 6.0* 3.6 5.3*  CL 93*  --  95* 97* 94* 95*  CO2 27  --  30 23 28 25   GLUCOSE 98  --  100* 94 100* 104*  BUN 91*   --  51* 73* 37* 57*  CREATININE 8.60*  --  5.73* 7.65* 4.92* 6.93*  CALCIUM 8.7*  --  9.0 8.5* 8.1* 8.2*  AST  --   --  18 15 20 16   ALT  --   --  12 11 12 9   ALKPHOS  --   --  70 63 58 64  BILITOT  --   --  0.4 0.4 0.4 0.2*  ALBUMIN 1.8*  --  1.9* 1.8* 1.9* 1.9*  MG 2.8*  --  2.5* 2.5* 2.2 2.5*  CRP  --   --  7.1* 9.6* 7.0* 8.9*  PROCALCITON  --  6.99 4.20 2.77 1.79 1.52    RADIOLOGY STUDIES/RESULTS: MR WRIST LEFT WO CONTRAST  Result Date: 05/27/2021 CLINICAL DATA:  COVID related encephalopathy with progressive left wrist pain over the last week. History of end-stage renal disease and systemic lupus erythematosus. Bacteremia. EXAM: MR OF THE LEFT WRIST WITHOUT CONTRAST TECHNIQUE: Multiplanar, multisequence MR imaging of the left wrist was performed. No intravenous contrast was administered. COMPARISON:  Radiographs and CT 05/20/2021. FINDINGS: Ligaments: The scapholunate and lunotriquetral ligaments appear intact. Triangular fibrocartilage: The triangular fibrocartilage complex appears intact. The ulnar variance is neutral. Tendons: The flexor and extensor tendons appear unremarkable. No significant tenosynovitis identified. Carpal tunnel/median nerve: Unremarkable. Guyon's canal: Unremarkable. Joint/cartilage: Moderate to large radiocarpal and midcarpal joint effusions, not clearly demonstrated on previous CT. No significant fluid within the distal radioulnar joint. Bones/carpal alignment: The carpal bone alignment is normal.Patchy subchondral edema within the scaphoid, lunate, triquetrum and hamate bone. There are possible small erosions. No cortical destruction or acute fracture identified. Other: Generalized subcutaneous edema surrounding the wrist, nonspecific, although suspicious for cellulitis. No focal fluid collections identified. IMPRESSION: 1. Moderate to large radiocarpal and midcarpal joint effusions with nonspecific patchy subchondral edema and possible erosions in the carpal bones.  In this clinical context, these findings are suspicious for septic arthritis and possible early osteomyelitis. Joint aspiration recommended. 2. In addition, there is generalized subcutaneous edema surrounding the wrist which is nonspecific, although suggestive of cellulitis. 3. No focal drainable soft tissue fluid collection or tenosynovitis. The wrist ligaments appear intact. 4. These results will be called to the ordering clinician or representative by the Radiologist Assistant, and communication documented in the PACS or [employer]. Electronically Signed   By: Richardean Sale M.D.   On: 05/27/2021 15:47     LOS: 13 days   Signature  Lala Lund M.D on 05/29/2021 at 9:52 AM   -  To page go to [email or web address]

## 2021-05-29 NOTE — Progress Notes (Signed)
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    # Strep pneumoniae bacteremia #COVID infection SP remdesivir x 3days #ESRD on HD # Possible septic arthritis of L4-L5 -Pt has a productive cough, suspect that she has superimposed pneumonia leading to bacteremia. Although CXR today did not show consolidation and pt is no RA. Ordered sputum Cx. Repeated blood Cx to ensure clearance.  -TTE did not show valvular vegetation (05/21/21). TEE which did not show vegetation -MRI spine showed possible septic arthritis of L4-L5. IR engaged and fluid is not amenable to drainage.      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 Annabelle Harman at [facility] on 03/02/2018. Noted she was Dx at age 59 with ANA titer 1:1280 with centromere pattern and +dsDNA. Reports she has been off on plaquenil for 1.5 years. -  Pt reported she has wrist pain for "a while". The CT w/ contrast(1/11) of left wrist did not show an effusion or signs of infection. Clinical exam and imaging was not concernign for infection at the time. Of note pt reports she had similar sypmtoms with right knee swelling years ago, which was though to be related to her lupus. She isis not undergo surgical intervention. -Pt was started on  prednisone. Follow-up:  -Pt had increased right hand swelling pain this week. MRI(1/18) showed moderate to large radiocarpal midcarpal joint effusion with erosive changes in carpal bones suspicious for septic arthritis/possible early osteomyelitis. -Pt underwent exploration and drainage with orthopaedics on 05/27/20 and fluid and synovial tissue Bx was sent, no organisms seen on gram stain.  Per OR note fluid did not appear infections.  Recommendations:  -Continue cefazolin with HD, anticipate 6 weeks of antibiotics form negative Cx(EOT 07/01/21).  This course will be sufficient to cover septic arthritis(hand and spine)/possible OM. -Follow OR Cx, Re-engage ID if Cx grow organisms besides strep pneumoniae. -ID follow-up appointment scheduled on 1/26 at 10:45AM     Microbiology:   Antibiotics: Ceftriaxone 1/11-1/16 Penicillin 1/13-p Cefazolin 1/19-p   Cultures: Blood 1/11 strep pneumoniae  1/12 NGTD  SUBJECTIVE: No new complaints. Reports her hand still hurts. She reports increased ROM in her fingers.  Interval: Afebrile overnight. Wbc 9.3K. Review of Systems: Review of Systems  All other systems reviewed and are negative.   Scheduled Meds:  amLODipine  10 mg Oral Daily   carvedilol  3.125 mg Oral BID WC   Chlorhexidine Gluconate Cloth  6 each Topical Q0600   cinacalcet  60 mg Oral Q T,Th,Sa-HD   doxercalciferol  6 mcg Intravenous Q T,Th,Sa-HD   famotidine  20 mg Oral Daily   feeding supplement (NEPRO CARB STEADY)  237 mL Oral BID BM   heparin injection (subcutaneous)  5,000 Units Subcutaneous Q8H   multivitamin  1 tablet Oral QHS   nicotine  14 mg Transdermal Daily   predniSONE  40 mg Oral Q breakfast   sodium chloride flush  3 mL Intravenous Q12H   Continuous Infusions:  sodium chloride      ceFAZolin (ANCEF) IV     PRN Meds:.sodium chloride, acetaminophen **OR** acetaminophen, hydrALAZINE, naLOXone (NARCAN)  injection, oxyCODONE, sodium chloride flush No Known  Allergies  OBJECTIVE: Vitals:   05/29/21 0630 05/29/21 0639 05/29/21 0812 05/29/21 0821  BP: 111/70 126/74 (!) 160/79 119/72  Pulse: 81 86 81 83  Resp:  16 19 18   Temp:  97.6 F (36.4 C) 99.1 F (37.3 C) 98.8 F (37.1 C)  TempSrc:  Oral    SpO2:  98% 97% 100%  Weight:  50.7 kg    Height:       Body mass index is 19.19 kg/m.  Physical Exam Constitutional:      Appearance: Normal appearance.  HENT:     Head: Normocephalic and atraumatic.     Right Ear: Tympanic membrane normal.     Left Ear: Tympanic membrane normal.     Nose: Nose normal.     Mouth/Throat:     Mouth: Mucous membranes are moist.  Eyes:     Extraocular Movements: Extraocular movements intact.     Conjunctiva/sclera: Conjunctivae normal.     Pupils: Pupils are equal, round, and reactive to light.  Cardiovascular:     Rate and Rhythm: Normal rate and regular rhythm.     Heart sounds: No murmur heard.   No friction rub. No gallop.  Pulmonary:     Effort: Pulmonary effort is normal.     Breath sounds: Normal breath sounds.  Abdominal:     General: Abdomen is flat.     Palpations: Abdomen is soft.  Musculoskeletal:     Comments: Left wrist bandaged. Pt is able to move her fingers.   Skin:    General: Skin is warm and dry.  Neurological:     General: No focal deficit present.     Mental Status: She is alert and oriented to person, place, and time.  Psychiatric:        Mood and Affect: Mood normal.      Lab Results Lab Results  Component Value Date   WBC 10.5 05/28/2021   HGB 9.6 (L) 05/28/2021   HCT 30.8 (L) 05/28/2021   MCV 90.1 05/28/2021   PLT 339 05/28/2021    Lab Results  Component Value Date   CREATININE 6.93 (H) 05/28/2021   BUN 57 (H) 05/28/2021   NA 131 (L) 05/28/2021   K 5.3 (H) 05/28/2021   CL 95 (L) 05/28/2021   CO2 25 05/28/2021    Lab Results  Component Value Date   ALT 9 05/28/2021   AST 16 05/28/2021   ALKPHOS 64 05/28/2021   BILITOT 0.2 (L) 05/28/2021         Laurice Record, MD [facility] 05/29/2021, 10:32 AM

## 2021-05-29 NOTE — Progress Notes (Signed)
Nephrology Follow-Up Consult note   Assessment/Recommendations: Kathleen [name] is a/an 61 y.o. female with a past medical history significant for ESRD, admitted for COVID, strep pneumo bacteremia.     [facility] HD: [name]   3h 43min  50.8kg 350/1.5  2/2 bath F160  LUE AVG    - last post weight 1/3 was 48.2 kg  - sensipar 60 mg three times a week  - hectorol 6 mcg IV tiw  - venofer 100 mg weekly  - mircera 75 mcg q 2wks last 12/20, held 1/3 due to [place]  ESRD: HD on TTS schedule.  Strep pneumo bacteremia: On IV antibiotics.  Followed by ID.  TEE negative.  COVID-pneumonia: s/p treatment by primary team  EDW/BP: Under dry weight intermittently, slightly volume up at this time.  Continue to remove fluid as able.  GI bleeding: intermittent hematochezia. Outpatient endoscopic eval. CTM per primary  Anemia of CKD: Hgb 9.6, no esa currently but will consider. No iron given infection  Secondary hyperparathyroidism: Continue Hectorol and Sensipar.  Phos has been low. Hold binder  SLE: on prednisone per primary  Wrist swelling: with concern of septic arthritis vs auto immune related. S/p washout. F/u cultures.   Recommendations conveyed to primary service.    [facility] 05/29/2021 10:25 AM  ___________________________________________________________  CC: ESRD, COVID, strep pneumo bacteremia  Interval History/Subjective: Patient again very tired.  Tolerated dialysis this morning without significant issues.  Continues to have left wrist pain and swelling.   Medications:  Current Facility-Administered Medications  Medication Dose Route Frequency Provider Last Rate Last Admin   0.9 %  sodium chloride infusion  250 mL Intravenous PRN [name], [name], MD       acetaminophen (TYLENOL) tablet 650 mg  650 mg Oral Q6H PRN Geralynn Rile, MD   650 mg at 05/20/21 6222   Or   acetaminophen (TYLENOL) suppository 650 mg  650 mg Rectal Q6H PRN  Geralynn Rile, MD       amLODipine (NORVASC) tablet 10 mg  10 mg Oral Daily [name], [name], MD   10 mg at 05/28/21 1010   carvedilol (COREG) tablet 3.125 mg  3.125 mg Oral BID WC Thurnell Lose, MD   3.125 mg at 05/28/21 1634   ceFAZolin (ANCEF) IVPB 1 g/50 mL premix  1 g Intravenous Q24H Jackson, Courtney P, Student-PharmD       Chlorhexidine Gluconate Cloth 2 % PADS 6 each  6 each Topical Q0600 [name], [name], MD   6 each at 05/28/21 1011   cinacalcet (SENSIPAR) tablet 60 mg  60 mg Oral Q T,Th,Sa-HD Geralynn Rile, MD   60 mg at 05/26/21 2011   doxercalciferol (HECTOROL) injection 6 mcg  6 mcg Intravenous Q T,Th,Sa-HD Geralynn Rile, MD   6 mcg at 05/29/21 0630   famotidine (PEPCID) tablet 20 mg  20 mg Oral Daily Geralynn Rile, MD   20 mg at 05/28/21 1010   feeding supplement (NEPRO CARB STEADY) liquid 237 mL  237 mL Oral BID BM Thurnell Lose, MD       heparin injection 5,000 Units  5,000 Units Subcutaneous Q8H Geralynn Rile, MD   5,000 Units at 05/28/21 1634   hydrALAZINE (APRESOLINE) injection 10 mg  10 mg Intravenous Q6H PRN Geralynn Rile, MD   10 mg at 05/25/21 1101   multivitamin (RENA-VIT) tablet 1 tablet  1 tablet Oral QHS Geralynn Rile, MD  1 tablet at 05/28/21 0132   naloxone [facility]) injection 0.4 mg  0.4 mg Intravenous PRN Geralynn Rile, MD       nicotine (NICODERM CQ - dosed in mg/24 hours) patch 14 mg  14 mg Transdermal Daily [name], [name], MD   14 mg at 05/28/21 1011   oxyCODONE (Oxy IR/ROXICODONE) immediate release tablet 10 mg  10 mg Oral Q4H PRN Geralynn Rile, MD   10 mg at 05/28/21 1635   predniSONE (DELTASONE) tablet 40 mg  40 mg Oral Q breakfast Thurnell Lose, MD   40 mg at 05/28/21 1010   sodium chloride flush (NS) 0.9 % injection 3 mL  3 mL Intravenous Q12H Geralynn Rile, MD   3 mL at 05/27/21 1101   sodium chloride flush (NS) 0.9 % injection 3 mL  3 mL Intravenous  PRN Geralynn Rile, MD          Review of Systems: 10 systems reviewed and negative except per interval history/subjective  Physical Exam: Vitals:   05/29/21 0812 05/29/21 0821  BP: (!) 160/79 119/72  Pulse: 81 83  Resp: 19 18  Temp: 99.1 F (37.3 C) 98.8 F (37.1 C)  SpO2: 97% 100%   No intake/output data recorded.  Intake/Output Summary (Last 24 hours) at 05/29/2021 1025 Last data filed at 05/29/2021 9562 Gross per 24 hour  Intake --  Output 2500 ml  Net -2500 ml   Constitutional: well-appearing, tired but no distress ENMT: ears and nose without scars or lesions, MMM CV: normal rate, left wrist covered in bandage Respiratory: bilateral chest rise, normal work of breathing Gastrointestinal: soft, no distension Skin: no visible lesions or rashes Psych: alert, appropriate mood and affect   Test Results I personally reviewed new and old clinical labs and radiology tests Lab Results  Component Value Date   NA 131 (L) 05/28/2021   K 5.3 (H) 05/28/2021   CL 95 (L) 05/28/2021   CO2 25 05/28/2021   BUN 57 (H) 05/28/2021   CREATININE 6.93 (H) 05/28/2021   CALCIUM 8.2 (L) 05/28/2021   ALBUMIN 1.9 (L) 05/28/2021   PHOS 2.3 (L) 05/24/2021

## 2021-05-29 NOTE — Anesthesia Postprocedure Evaluation (Signed)
Anesthesia Post Note  Patient: Kathleen [name]  Procedure(s) Performed: IRRIGATION AND DEBRIDEMENT OF WRIST (Left)     Patient location during evaluation: PACU Anesthesia Type: General Level of consciousness: sedated and patient cooperative Pain management: pain level controlled Vital Signs Assessment: post-procedure vital signs reviewed and stable Respiratory status: spontaneous breathing Cardiovascular status: stable Anesthetic complications: no   No notable events documented.  Last Vitals:  Vitals:   05/29/21 0812 05/29/21 0821  BP: (!) 160/79 119/72  Pulse: 81 83  Resp: 19 18  Temp: 37.3 C 37.1 C  SpO2: 97% 100%    Last Pain:  Vitals:   05/29/21 0639  TempSrc: Oral  PainSc: 0-No pain                 Nolon Nations

## 2021-05-30 LAB — RENAL FUNCTION PANEL
Albumin: 2.2 g/dL — ABNORMAL LOW (ref 3.5–5.0)
Anion gap: 11 (ref 5–15)
BUN: 21 mg/dL — ABNORMAL HIGH (ref 6–20)
CO2: 27 mmol/L (ref 22–32)
Calcium: 8.8 mg/dL — ABNORMAL LOW (ref 8.9–10.3)
Chloride: 96 mmol/L — ABNORMAL LOW (ref 98–111)
Creatinine, Ser: 3.53 mg/dL — ABNORMAL HIGH (ref 0.44–1.00)
GFR, Estimated: 14 mL/min — ABNORMAL LOW (ref >60)
Glucose, Bld: 133 mg/dL — ABNORMAL HIGH (ref 70–99)
Phosphorus: 3.4 mg/dL (ref 2.5–4.6)
Potassium: 4 mmol/L (ref 3.5–5.1)
Sodium: 134 mmol/L — ABNORMAL LOW (ref 135–145)

## 2021-05-30 LAB — RESP PANEL BY RT-PCR (FLU A&B, COVID) ARPGX2
Influenza A by PCR: NEGATIVE
Influenza B by PCR: NEGATIVE
SARS Coronavirus 2 by RT PCR: NEGATIVE

## 2021-05-30 LAB — CBC
HCT: 29.7 % — ABNORMAL LOW (ref 36.0–46.0)
Hemoglobin: 9.5 g/dL — ABNORMAL LOW (ref 12.0–15.0)
MCH: 29 pg (ref 26.0–34.0)
MCHC: 32 g/dL (ref 30.0–36.0)
MCV: 90.5 fL (ref 80.0–100.0)
Platelets: 409 10*3/uL — ABNORMAL HIGH (ref 150–400)
RBC: 3.28 MIL/uL — ABNORMAL LOW (ref 3.87–5.11)
RDW: 19.5 % — ABNORMAL HIGH (ref 11.5–15.5)
WBC: 11.9 10*3/uL — ABNORMAL HIGH (ref 4.0–10.5)
nRBC: 0 % (ref 0.0–0.2)

## 2021-05-30 NOTE — Progress Notes (Addendum)
PROGRESS NOTE        PATIENT DETAILS Name: Kathleen [name] Age: 61 y.o. Sex: female Date of Birth: 07/14/1960 Admit Date: 05/15/2021 Admitting Physician Orma Flaming, MD [id number], Beverly Gust, NP  Brief Narrative:  Patient is a 61 y.o. female with history of ESRD on HD TTS, HFrEF, SLE, anemia due to CKD, HTN-who presented with at least 1 week history of worsening weakness/lethargy, intermittent diarrhea, hematochezia-she had missed her last 3 HD sessions prior to hospitalization due to weakness/lethargy.  She was found to have COVID-19 infection-and thought to have uremic encephalopathy in the setting of missed HD.  She stabilized with Remdesivir and hemodialysis.  Unfortunately-Hospital course was complicated by severe arthralgias mostly in her left wrist/spine-low-grade fever-she was subsequently found to have streptococcal pneumonia bacteremia.  See below for further details.  Significant Hospital events 1/06>> admit with diarrhea/intermittent hematochezia from COVID-uremic encephalopathy from missed HD. 1/10>> developed arthralgias-mostly in her left wrist.Prednisone ordered 1/11>> low-grade fever-worsening left wrist arthralgia-CT left wrist negative for effusion.   1/11>> blood culture positive Streptococcus pneumoniae-started on IV Rocephin. 1/19 Dr Apolonio Schneiders - Left wrist radiocarpal and midcarpal joint arthrotomy exploration and drainage  Subjective:  Patient in bed watching television appears to be in no distress denies any headache chest or abdominal pain, still has some left hand postop pain and discomfort  Objective: Vitals: Blood pressure 103/64, pulse 74, temperature 97.9 F (36.6 C), temperature source Oral, resp. rate 18, height 5\' 4"  (1.626 m), weight 49.9 kg, SpO2 90 %.   Exam:  Awake Alert, No new F.N deficits, Normal affect [place].AT,PERRAL Supple Neck, No JVD,   Symmetrical Chest wall movement, Good air movement bilaterally, CTAB RRR,No  Gallops, Rubs or new Murmurs,  +ve B.Sounds, Abd Soft, No tenderness,   Left hand and wrist along with forearm under bandage.  Mild swelling in her left hand, sensation intact in her fingers with good range of motion.    Assessment/Plan:  COVID-19 infection: Improved-diarrhea has resolved-no PNA on CXR-completed 3 days of Remdesivir, she is on 21 days of isolation due to her immunocompromise state from prolonged steroid use.  Will repeat a COVID test with CT threshold count on 05/30/2021 to see if she can come off of isolation earlier.  Diarrhea with intermittent hematochezia: Diarrhea due to COVID-19 infection-suspect hematochezia was in the setting of some mucosal sloughing.  Hematochezia and diarrhea have resolved.  Given significant debility/deconditioning/stable hemoglobin-suspect we can postpone endoscopic evaluation to the outpatient setting.  Discussed this with patient-she agrees.    Acute metabolic encephalopathy: Resolved.  Due to uremia in the setting of missed HD with some contribution from COVID-19.    Streptococcal pneumonia with bacteremia: Developed low-grade fever/left wrist arthralgia/back pain on 1/10-1/11-blood cultures are positive for Streptococcus pneumonia.  No PNA on CXR.  TTE without vegetations.  MRI with suspicious septic arthritis at L4-L5 level-Per IR-this is not amenable to drainage.  TEE stable on 05/25/2021.  Currently on IV cefazolin per ID with stop date of 07/01/2021.  Repeat blood cultures drawn on 05/21/2021 are negative so far.  Case discussed with neurosurgeon Dr. Kathyrn Sheriff including the MRI findings, does not think these findings are of any significance.  No further work-up for this issue of nonspecific MRI changes.   Left wrist arthralgia: Initially her exam was unremarkable and CT left wrist unremarkable, subsequently developed more swelling there after MRI  was repeated suggesting septic arthritis, she was seen by orthopedic surgeon Dr. Apolonio Schneiders and underwent  Left wrist radiocarpal and midcarpal joint arthrotomy exploration and drainage on 05/27/2021.  Clinically improving now continue to monitor with IV antibiotics.  Follow culture results from the joint washout, continue cefazolin tentative stop date 07/01/21.  Have requested orthopedics to evaluate her 1 more time on 05/30/2021 as she is having some postop pain and swelling.    Case discussed with Dr. Apolonio Schneiders over the phone on 05/30/2021 also with his partner on call Dr. Susie Cassette PA on the same date.   SLE: Claims to have a longstanding history of lupus-followed by rheumatology at [facility].  Per patient-her Plaquenil was discontinued by her nephrologist several months ago-I currently on oral prednisone 40 mg daily, plan is post discharge follow-up with her rheumatologist at [facility].  ESRD: Nephrology following-we will defer hemodialysis and electrolyte management to nephrology service.  Chronic systolic heart failure (EF 45-50% on 03/13/2021): Volume status is stable.  Watch closely.  HTN: BP high on Norvasc have added Coreg and as needed hydralazine.  Prolonged QTC: Appears to have develops some amount of QTC elongation over the past few months-repeat EKG on 1/11 and 1/13 with stable/normalized QTC.    We will attempt to keep K> 4, and Mg>2.  Monitor EKGs periodically-continue telemetry monitoring.    Tobacco abuse: Continue transdermal nicotine  LLL Nodule - outpt Pulm follow up, within a month of DC.  Deconditioning/debility: Likely due to COVID-19 infection-and worsening uremia from missed HD.  Hopefully will continue to improve with further hemodialysis-improvement in underlying COVID-19 infection.  Evaluated by PT services-felt to require SNF on discharge.  Social worker following.   BMI Estimated body mass index is 18.88 kg/m as calculated from the following:   Height as of this encounter: 5\' 4"  (1.626 m).   Weight as of this encounter: 49.9 kg.     Consults: ID,  nephrology.  Neurosurgeon Dr. Kathyrn Sheriff on 05/24/2021 over the phone, orthopedics DVT Prophylaxis: SQ heparin Code Status:Full code  Family Communication: None at bedside  Disposition Plan: Status is: Inpatient  Severe debility/deconditioning-now with worsening encephalopathy due to uremia-needs hemodialysis today.  On IV Remdesivir for COVID.  SNF planned on discharge.  Diet: Diet Order             Diet regular Room service appropriate? Yes; Fluid consistency: Thin  Diet effective now                    MEDICATIONS: Scheduled Meds:  amLODipine  10 mg Oral Daily   carvedilol  3.125 mg Oral BID WC   Chlorhexidine Gluconate Cloth  6 each Topical Q0600   cinacalcet  60 mg Oral Q T,Th,Sa-HD   doxercalciferol  6 mcg Intravenous Q T,Th,Sa-HD   famotidine  20 mg Oral Daily   feeding supplement (NEPRO CARB STEADY)  237 mL Oral BID BM   heparin injection (subcutaneous)  5,000 Units Subcutaneous Q8H   multivitamin  1 tablet Oral QHS   nicotine  14 mg Transdermal Daily   predniSONE  40 mg Oral Q breakfast   sodium chloride flush  3 mL Intravenous Q12H   Continuous Infusions:  sodium chloride      ceFAZolin (ANCEF) IV 1 g (05/29/21 2252)   PRN Meds:.sodium chloride, acetaminophen **OR** acetaminophen, hydrALAZINE, naLOXone (NARCAN)  injection, oxyCODONE, sodium chloride flush   I have personally reviewed following labs and imaging studies  LABORATORY DATA:  Recent Labs  Lab 05/24/21 0119 05/25/21 0029 05/26/21 0252 05/27/21 0116 05/28/21 0204  WBC 11.2* 12.5* 12.1* 9.3 10.5  HGB 8.9* 10.0* 9.2* 9.3* 9.6*  HCT 27.7* 31.7* 28.3* 28.3* 30.8*  PLT 214 235 254 288 339  MCV 86.6 89.5 87.6 87.1 90.1  MCH 27.8 28.2 28.5 28.6 28.1  MCHC 32.1 31.5 32.5 32.9 31.2  RDW 19.1* 19.3* 19.3* 19.2* 19.3*  LYMPHSABS  --  1.2 0.8 1.4 0.7  MONOABS  --  1.0 0.6 0.8 0.4  EOSABS  --  0.0 0.0 0.0 0.0  BASOSABS  --  0.0 0.0 0.0 0.0    Recent Labs  Lab 05/24/21 0119 05/24/21 0728  05/25/21 0029 05/26/21 0252 05/27/21 0116 05/28/21 0427  NA 131*  --  133* 132* 133* 131*  K 4.6  --  4.3 6.0* 3.6 5.3*  CL 93*  --  95* 97* 94* 95*  CO2 27  --  30 23 28 25   GLUCOSE 98  --  100* 94 100* 104*  BUN 91*  --  51* 73* 37* 57*  CREATININE 8.60*  --  5.73* 7.65* 4.92* 6.93*  CALCIUM 8.7*  --  9.0 8.5* 8.1* 8.2*  AST  --   --  18 15 20 16   ALT  --   --  12 11 12 9   ALKPHOS  --   --  70 63 58 64  BILITOT  --   --  0.4 0.4 0.4 0.2*  ALBUMIN 1.8*  --  1.9* 1.8* 1.9* 1.9*  MG 2.8*  --  2.5* 2.5* 2.2 2.5*  CRP  --   --  7.1* 9.6* 7.0* 8.9*  PROCALCITON  --  6.99 4.20 2.77 1.79 1.52    RADIOLOGY STUDIES/RESULTS: No results found.   LOS: 14 days   Signature  Lala Lund M.D on 05/30/2021 at 8:59 AM   -  To page go to [email or web address]

## 2021-05-31 NOTE — Progress Notes (Signed)
PROGRESS NOTE        PATIENT DETAILS Name: Kathleen [name] Age: 61 y.o. Sex: female Date of Birth: 1960-07-15 Admit Date: 05/15/2021 Admitting Physician Orma Flaming, MD PZW:CHENI, Beverly Gust, NP  Brief Narrative:  Patient is a 61 y.o. female with history of ESRD on HD TTS, HFrEF, SLE, anemia due to CKD, HTN-who presented with at least 1 week history of worsening weakness/lethargy, intermittent diarrhea, hematochezia-she had missed her last 3 HD sessions prior to hospitalization due to weakness/lethargy.  She was found to have COVID-19 infection-and thought to have uremic encephalopathy in the setting of missed HD.  She stabilized with Remdesivir and hemodialysis.  Unfortunately-Hospital course was complicated by severe arthralgias mostly in her left wrist/spine-low-grade fever-she was subsequently found to have streptococcal pneumonia bacteremia.  See below for further details.  Significant Hospital events 1/06>> admit with diarrhea/intermittent hematochezia from COVID-uremic encephalopathy from missed HD. 1/10>> developed arthralgias-mostly in her left wrist.Prednisone ordered 1/11>> low-grade fever-worsening left wrist arthralgia-CT left wrist negative for effusion.   1/11>> blood culture positive Streptococcus pneumoniae-started on IV Rocephin. 1/19 Dr Apolonio Schneiders - Left wrist radiocarpal and midcarpal joint arthrotomy exploration and drainage  Subjective:  Patient in bed, appears comfortable, denies any headache, no fever, no chest pain or pressure, no shortness of breath , no abdominal pain. No new focal weakness, still has some left hand postop pain and discomfort.  Objective: Vitals: Blood pressure 118/62, pulse 84, temperature 97.7 F (36.5 C), temperature source Oral, resp. rate 16, height 5\' 4"  (1.626 m), weight 51.1 kg, SpO2 100 %.   Exam:  Awake Alert, No new F.N deficits, Normal affect [place].AT,PERRAL Supple Neck, No JVD,   Symmetrical Chest wall  movement, Good air movement bilaterally, CTAB RRR,No Gallops, Rubs or new Murmurs,  +ve B.Sounds, Abd Soft, No tenderness,   Left hand and wrist along with forearm under bandage.  Mild swelling in her left hand, sensation intact in her fingers with good range of motion.    Assessment/Plan:  COVID-19 infection: Improved-diarrhea has resolved-no PNA on CXR-completed 3 days of Remdesivir, she is on 21 days of isolation due to her immunocompromise state from prolonged steroid use.  Will repeat a COVID test with CT threshold count on 05/30/2021 to see if she can come off of isolation earlier.  Diarrhea with intermittent hematochezia: Diarrhea due to COVID-19 infection-suspect hematochezia was in the setting of some mucosal sloughing.  Hematochezia and diarrhea have resolved.  Given significant debility/deconditioning/stable hemoglobin-suspect we can postpone endoscopic evaluation to the outpatient setting.  Discussed this with patient-she agrees.    Acute metabolic encephalopathy: Resolved.  Due to uremia in the setting of missed HD with some contribution from COVID-19.    Streptococcal pneumonia with bacteremia: Developed low-grade fever/left wrist arthralgia/back pain on 1/10-1/11-blood cultures are positive for Streptococcus pneumonia.  No PNA on CXR.  TTE without vegetations.  MRI with suspicious septic arthritis at L4-L5 level-Per IR-this is not amenable to drainage.  TEE stable on 05/25/2021.  Currently on IV cefazolin per ID with stop date of 07/01/2021.  Repeat blood cultures drawn on 05/21/2021 are negative so far.  Case discussed with neurosurgeon Dr. Kathyrn Sheriff including the MRI findings, does not think these findings are of any significance.  No further work-up for this issue of nonspecific MRI changes.   Left wrist septic arthritis: Initially her exam was unremarkable and CT  left wrist unremarkable, subsequently developed more swelling there after MRI was repeated suggesting septic arthritis, she  was seen by orthopedic surgeon Dr. Apolonio Schneiders and underwent Left wrist radiocarpal and midcarpal joint arthrotomy exploration and drainage on 05/27/2021.  Clinically improving now continue to monitor with IV antibiotics.  Follow culture results from the joint washout, continue cefazolin tentative stop date 07/01/21.  Have requested orthopedics to evaluate her 1 more time on 05/30/2021 as she is having some postop pain and swelling.    Case discussed with Dr. Apolonio Schneiders over the phone on 05/30/2021 also with his partner on call Dr. Susie Cassette PA on the same date and Dr. Apolonio Schneiders on 05/30/2021.   SLE: Claims to have a longstanding history of lupus-followed by rheumatology at [facility].  Per patient-her Plaquenil was discontinued by her nephrologist several months ago-I currently on oral prednisone 40 mg daily, plan is post discharge follow-up with her rheumatologist at [facility].  ESRD: Nephrology following-we will defer hemodialysis and electrolyte management to nephrology service.  Chronic systolic heart failure (EF 45-50% on 03/13/2021): Volume status is stable.  Watch closely.  HTN: BP high on Norvasc have added Coreg and as needed hydralazine.  Prolonged QTC: Appears to have develops some amount of QTC elongation over the past few months-repeat EKG on 1/11 and 1/13 with stable/normalized QTC.    We will attempt to keep K> 4, and Mg>2.  Monitor EKGs periodically-continue telemetry monitoring.    Tobacco abuse: Continue transdermal nicotine  LLL Nodule - outpt Pulm follow up, within a month of DC.    Deconditioning/debility: Likely due to COVID-19 infection-and worsening uremia from missed HD.  Hopefully will continue to improve with further hemodialysis-improvement in underlying COVID-19 infection.  Evaluated by PT services-felt to require SNF on discharge.  Social worker following.   BMI Estimated body mass index is 19.34 kg/m as calculated from the following:   Height as of this  encounter: 5\' 4"  (1.626 m).   Weight as of this encounter: 51.1 kg.     Consults: ID, nephrology.  Neurosurgeon Dr. Kathyrn Sheriff on 05/24/2021 over the phone, orthopedics DVT Prophylaxis: SQ heparin Code Status:Full code  Family Communication: None at bedside  Disposition Plan: Status is: Inpatient  Severe debility/deconditioning-now with worsening encephalopathy due to uremia-needs hemodialysis today.  On IV Remdesivir for COVID.  SNF planned on discharge.  Diet: Diet Order             Diet regular Room service appropriate? Yes; Fluid consistency: Thin  Diet effective now                    MEDICATIONS: Scheduled Meds:  amLODipine  10 mg Oral Daily   carvedilol  3.125 mg Oral BID WC   Chlorhexidine Gluconate Cloth  6 each Topical Q0600   cinacalcet  60 mg Oral Q T,Th,Sa-HD   doxercalciferol  6 mcg Intravenous Q T,Th,Sa-HD   famotidine  20 mg Oral Daily   feeding supplement (NEPRO CARB STEADY)  237 mL Oral BID BM   heparin injection (subcutaneous)  5,000 Units Subcutaneous Q8H   multivitamin  1 tablet Oral QHS   nicotine  14 mg Transdermal Daily   predniSONE  40 mg Oral Q breakfast   sodium chloride flush  3 mL Intravenous Q12H   Continuous Infusions:  sodium chloride      ceFAZolin (ANCEF) IV 1 g (05/30/21 2005)   PRN Meds:.sodium chloride, acetaminophen **OR** acetaminophen, hydrALAZINE, naLOXone (NARCAN)  injection, oxyCODONE, sodium chloride flush  I have personally reviewed following labs and imaging studies  LABORATORY DATA:  Recent Labs  Lab 05/25/21 0029 05/26/21 0252 05/27/21 0116 05/28/21 0204 05/30/21 1641  WBC 12.5* 12.1* 9.3 10.5 11.9*  HGB 10.0* 9.2* 9.3* 9.6* 9.5*  HCT 31.7* 28.3* 28.3* 30.8* 29.7*  PLT 235 254 288 339 409*  MCV 89.5 87.6 87.1 90.1 90.5  MCH 28.2 28.5 28.6 28.1 29.0  MCHC 31.5 32.5 32.9 31.2 32.0  RDW 19.3* 19.3* 19.2* 19.3* 19.5*  LYMPHSABS 1.2 0.8 1.4 0.7  --   MONOABS 1.0 0.6 0.8 0.4  --   EOSABS 0.0 0.0 0.0 0.0   --   BASOSABS 0.0 0.0 0.0 0.0  --     Recent Labs  Lab 05/25/21 0029 05/26/21 0252 05/27/21 0116 05/28/21 0427 05/30/21 1827  NA 133* 132* 133* 131* 134*  K 4.3 6.0* 3.6 5.3* 4.0  CL 95* 97* 94* 95* 96*  CO2 30 23 28 25 27   GLUCOSE 100* 94 100* 104* 133*  BUN 51* 73* 37* 57* 21*  CREATININE 5.73* 7.65* 4.92* 6.93* 3.53*  CALCIUM 9.0 8.5* 8.1* 8.2* 8.8*  AST 18 15 20 16   --   ALT 12 11 12 9   --   ALKPHOS 70 63 58 64  --   BILITOT 0.4 0.4 0.4 0.2*  --   ALBUMIN 1.9* 1.8* 1.9* 1.9* 2.2*  MG 2.5* 2.5* 2.2 2.5*  --   CRP 7.1* 9.6* 7.0* 8.9*  --   PROCALCITON 4.20 2.77 1.79 1.52  --     RADIOLOGY STUDIES/RESULTS: No results found.   LOS: 15 days   Signature  Lala Lund M.D on 05/31/2021 at 10:11 AM   -  To page go to [email or web address]

## 2021-05-31 NOTE — Progress Notes (Signed)
Nephrology Follow-Up Consult note   Assessment/Recommendations: Kathleen [name] is a/an 61 y.o. female with a past medical history significant for ESRD, admitted for COVID, strep pneumo bacteremia.     [name]   3h 58min  50.8kg 350/1.5  2/2 bath F160  LUE AVG    - last post weight 1/3 was 48.2 kg  - sensipar 60 mg three times a week  - hectorol 6 mcg IV tiw  - venofer 100 mg weekly  - mircera 75 mcg q 2wks last 12/20, held 1/3 due to [place]  ESRD: HD on TTS schedule. HD today.   Strep pneumo bacteremia: On IV antibiotics thru 07/01/21.  Followed by ID.  TEE negative.  COVID-pneumonia: s/p treatment by primary team  EDW/BP: Under dry weight intermittently, slightly volume up at this time.  Close to dry wt  GI bleeding: intermittent hematochezia. Outpatient endoscopic eval. CTM per primary  Anemia of CKD: Hgb 9.6, no esa currently but will consider. No iron given infection  Secondary hyperparathyroidism: Continue Hectorol and Sensipar.  Phos has been low. Hold binder  SLE: on prednisone per primary  Wrist swelling: with concern of septic arthritis vs auto immune related. S/p washout. F/u cultures are negative.    [facility] 05/30/2021 10:23 AM  ___________________________________________________________  CC: ESRD, COVID, strep pneumo bacteremia  Interval History/Subjective: no new c/o's, in good spirits   Medications:  Current Facility-Administered Medications  Medication Dose Route Frequency Provider Last Rate Last Admin   0.9 %  sodium chloride infusion  250 mL Intravenous PRN [name], [name], MD       acetaminophen (TYLENOL) tablet 650 mg  650 mg Oral Q6H PRN Geralynn Rile, MD   650 mg at 05/20/21 6734   Or   acetaminophen (TYLENOL) suppository 650 mg  650 mg Rectal Q6H PRN Geralynn Rile, MD       amLODipine (NORVASC) tablet 10 mg  10 mg Oral Daily Geralynn Rile, MD   10 mg at 05/31/21 0933    carvedilol (COREG) tablet 3.125 mg  3.125 mg Oral BID WC Lala Lund K, MD   3.125 mg at 05/31/21 0900   ceFAZolin (ANCEF) IVPB 1 g/50 mL premix  1 g Intravenous Q24H Laurice Record, MD 100 mL/hr at 05/30/21 2005 1 g at 05/30/21 2005   Chlorhexidine Gluconate Cloth 2 % PADS 6 each  6 each Topical Q0600 Geralynn Rile, MD   6 each at 05/31/21 0900   cinacalcet (SENSIPAR) tablet 60 mg  60 mg Oral Q T,Th,Sa-HD Geralynn Rile, MD   60 mg at 05/30/21 1728   doxercalciferol (HECTOROL) injection 6 mcg  6 mcg Intravenous Q T,Th,Sa-HD Geralynn Rile, MD   6 mcg at 05/30/21 1720   famotidine (PEPCID) tablet 20 mg  20 mg Oral Daily Geralynn Rile, MD   20 mg at 05/31/21 0932   feeding supplement (NEPRO CARB STEADY) liquid 237 mL  237 mL Oral BID BM Thurnell Lose, MD   237 mL at 05/31/21 0933   heparin injection 5,000 Units  5,000 Units Subcutaneous Q8H Geralynn Rile, MD   5,000 Units at 05/31/21 0544   hydrALAZINE (APRESOLINE) injection 10 mg  10 mg Intravenous Q6H PRN Geralynn Rile, MD   10 mg at 05/25/21 1101   multivitamin (RENA-VIT) tablet 1 tablet  1 tablet Oral QHS Geralynn Rile, MD   1 tablet at 05/30/21 2023   naloxone [facility])  injection 0.4 mg  0.4 mg Intravenous PRN [name], [name], MD       nicotine (NICODERM CQ - dosed in mg/24 hours) patch 14 mg  14 mg Transdermal Daily [name], [name], MD   14 mg at 05/31/21 7939   oxyCODONE (Oxy IR/ROXICODONE) immediate release tablet 10 mg  10 mg Oral Q4H PRN Geralynn Rile, MD   10 mg at 05/30/21 2354   predniSONE (DELTASONE) tablet 40 mg  40 mg Oral Q breakfast Thurnell Lose, MD   40 mg at 05/31/21 0900   sodium chloride flush (NS) 0.9 % injection 3 mL  3 mL Intravenous Q12H Geralynn Rile, MD   3 mL at 05/31/21 0933   sodium chloride flush (NS) 0.9 % injection 3 mL  3 mL Intravenous PRN Geralynn Rile, MD          Review of Systems: 10 systems reviewed and  negative except per interval history/subjective  Physical Exam: Vitals:   05/31/21 0757 05/31/21 0932  BP: 92/63 118/62  Pulse: 84   Resp: 16   Temp: 97.7 F (36.5 C)   SpO2: 100%    No intake/output data recorded.  Intake/Output Summary (Last 24 hours) at 05/31/2021 1023 Last data filed at 05/30/2021 2023 Gross per 24 hour  Intake 410 ml  Output 2465 ml  Net -2055 ml    Constitutional: well-appearing, tired but no distress ENMT: ears and nose without scars or lesions, MMM CV: normal rate, left wrist covered in bandage Respiratory: bilateral chest rise, normal work of breathing Gastrointestinal: soft, no distension Skin: no visible lesions or rashes Psych: alert, appropriate mood and affect   Test Results I personally reviewed new and old clinical labs and radiology tests Lab Results  Component Value Date   NA 134 (L) 05/30/2021   K 4.0 05/30/2021   CL 96 (L) 05/30/2021   CO2 27 05/30/2021   BUN 21 (H) 05/30/2021   CREATININE 3.53 (H) 05/30/2021   CALCIUM 8.8 (L) 05/30/2021   ALBUMIN 2.2 (L) 05/30/2021   PHOS 3.4 05/30/2021

## 2021-05-31 NOTE — Progress Notes (Signed)
Nephrology Follow-Up Progress note   HPI: Kathleen [name] is a/an 61 y.o. female with a past medical history significant for ESRD, admitted for COVID, strep pneumo bacteremia.     [name]   3h 1min  50.8kg 350/1.5  2/2 bath F160  LUE AVG    - last post weight 1/3 was 48.2 kg  - sensipar 60 mg three times a week  - hectorol 6 mcg IV tiw  - venofer 100 mg weekly  - mircera 75 mcg q 2wks last 12/20, held 1/3 due to [place]  Assessment/ Recommendations:  ESRD: HD on TTS schedule. HD today.   Strep pneumo bacteremia: On IV antibiotics through 07/01/21.  Followed by ID.  TEE negative.  COVID-pneumonia: s/p treatment by primary team. Off precautions now. F/u Covid test was negative.   L wrist pain: sp washout by ortho team on 1/18 for severe pain. Per OP note fluid was present but not purulent, sent for cx's which are negative at 3 days today.    EDW/BP: mostly euvolemic here, no vol ^ on exam.  Close to dry wt.   GI bleeding: intermittent hematochezia. Outpatient endoscopic eval. CTM per primary  Anemia of CKD: Hgb 9.6, no esa currently but will consider. No iron given infection  Secondary hyperparathyroidism: Continue Hectorol and Sensipar.  Phos has been low. Hold binder  SLE: on prednisone per primary     [facility] 05/30/2021 3:35 PM  ___________________________________________________________  CC: ESRD, COVID, strep pneumo bacteremia  Interval History/Subjective: seen on HD, in good spirits. Continues to have left wrist pain and swelling.   Medications:  Current Facility-Administered Medications  Medication Dose Route Frequency Provider Last Rate Last Admin   0.9 %  sodium chloride infusion  250 mL Intravenous PRN [name], [name], MD       acetaminophen (TYLENOL) tablet 650 mg  650 mg Oral Q6H PRN Geralynn Rile, MD   650 mg at 05/20/21 4235   Or   acetaminophen (TYLENOL) suppository 650 mg  650 mg Rectal Q6H PRN  Geralynn Rile, MD       amLODipine (NORVASC) tablet 10 mg  10 mg Oral Daily Geralynn Rile, MD   10 mg at 05/31/21 0933   carvedilol (COREG) tablet 3.125 mg  3.125 mg Oral BID WC Lala Lund K, MD   3.125 mg at 05/31/21 0900   ceFAZolin (ANCEF) IVPB 1 g/50 mL premix  1 g Intravenous Q24H Laurice Record, MD 100 mL/hr at 05/30/21 2005 1 g at 05/30/21 2005   Chlorhexidine Gluconate Cloth 2 % PADS 6 each  6 each Topical Q0600 Geralynn Rile, MD   6 each at 05/31/21 0900   cinacalcet (SENSIPAR) tablet 60 mg  60 mg Oral Q T,Th,Sa-HD Geralynn Rile, MD   60 mg at 05/30/21 1728   doxercalciferol (HECTOROL) injection 6 mcg  6 mcg Intravenous Q T,Th,Sa-HD Geralynn Rile, MD   6 mcg at 05/30/21 1720   famotidine (PEPCID) tablet 20 mg  20 mg Oral Daily Geralynn Rile, MD   20 mg at 05/31/21 0932   feeding supplement (NEPRO CARB STEADY) liquid 237 mL  237 mL Oral BID BM Thurnell Lose, MD   237 mL at 05/31/21 0933   heparin injection 5,000 Units  5,000 Units Subcutaneous Q8H Geralynn Rile, MD   5,000 Units at 05/31/21 0544   hydrALAZINE (APRESOLINE) injection 10 mg  10 mg Intravenous Q6H PRN [name],  [name], MD   10 mg at 05/25/21 1101   multivitamin (RENA-VIT) tablet 1 tablet  1 tablet Oral QHS Geralynn Rile, MD   1 tablet at 05/30/21 2023   naloxone [facility]) injection 0.4 mg  0.4 mg Intravenous PRN Geralynn Rile, MD       nicotine (NICODERM CQ - dosed in mg/24 hours) patch 14 mg  14 mg Transdermal Daily Geralynn Rile, MD   14 mg at 05/31/21 1308   oxyCODONE (Oxy IR/ROXICODONE) immediate release tablet 10 mg  10 mg Oral Q4H PRN Geralynn Rile, MD   10 mg at 05/30/21 2354   predniSONE (DELTASONE) tablet 40 mg  40 mg Oral Q breakfast Thurnell Lose, MD   40 mg at 05/31/21 0900   sodium chloride flush (NS) 0.9 % injection 3 mL  3 mL Intravenous Q12H Geralynn Rile, MD   3 mL at 05/31/21 0933   sodium chloride  flush (NS) 0.9 % injection 3 mL  3 mL Intravenous PRN Geralynn Rile, MD          Review of Systems: 10 systems reviewed and negative except per interval history/subjective  Physical Exam: Vitals:   05/31/21 0757 05/31/21 0932  BP: 92/63 118/62  Pulse: 84   Resp: 16   Temp: 97.7 F (36.5 C)   SpO2: 100%    No intake/output data recorded.  Intake/Output Summary (Last 24 hours) at 05/31/2021 1014 Last data filed at 05/30/2021 2023 Gross per 24 hour  Intake 410 ml  Output 2465 ml  Net -2055 ml    Constitutional: well-appearing, tired but no distress ENMT: ears and nose without scars or lesions, MMM CV: normal rate, left wrist covered in bandage Respiratory: bilateral chest rise, normal work of breathing Gastrointestinal: soft, no distension Skin: no visible lesions or rashes Psych: alert, appropriate mood and affect   Test Results I personally reviewed new and old clinical labs and radiology tests Lab Results  Component Value Date   NA 134 (L) 05/30/2021   K 4.0 05/30/2021   CL 96 (L) 05/30/2021   CO2 27 05/30/2021   BUN 21 (H) 05/30/2021   CREATININE 3.53 (H) 05/30/2021   CALCIUM 8.8 (L) 05/30/2021   ALBUMIN 2.2 (L) 05/30/2021   PHOS 3.4 05/30/2021

## 2021-05-31 NOTE — Progress Notes (Signed)
Mobility Specialist Progress Note:   05/31/21 1100  Mobility  Activity Ambulated with assistance in hallway;Ambulated independently in hallway  Level of Assistance Minimal assist, patient does 75% or more  Assistive Device None;Other (Comment) (HHA)  Distance Ambulated (ft) 900 ft  Activity Response Tolerated well  $Mobility charge 1 Mobility   Pt requiring HHA for steadying, eventually ambulated without assist in hallway without any overt LOBs. Pt asx during session, left sitting up in chair with all needs met.   Nelta Numbers Mobility Specialist  Phone [phone]

## 2021-05-31 NOTE — Progress Notes (Signed)
Nephrology Follow-Up Consult note   Assessment/Recommendations: Kathleen [name] is a/an 61 y.o. female with a past medical history significant for ESRD, admitted for COVID, strep pneumo bacteremia.     [name]   3h 1min  50.8kg 350/1.5  2/2 bath F160  LUE AVG    - last post weight 1/3 was 48.2 kg  - sensipar 60 mg three times a week  - hectorol 6 mcg IV tiw  - venofer 100 mg weekly  - mircera 75 mcg q 2wks last 12/20, held 1/3 due to [place]  ESRD: HD on TTS schedule. Had HD yesterday. Next HD 1/24.    Strep pneumo bacteremia: On IV antibiotics thru 07/01/21.  Followed by ID.  TEE negative.  COVID-pneumonia: s/p treatment by primary team  EDW/BP: Under dry weight intermittently, slightly volume up at this time.  Close to dry wt  GI bleeding: intermittent hematochezia. Outpatient endoscopic eval. CTM per primary  Anemia of CKD: Hgb 9.6, no esa currently but will consider. No iron given infection  Secondary hyperparathyroidism: Continue Hectorol and Sensipar.  Phos has been low. Hold binder  SLE: on prednisone per primary  Wrist swelling: with concern of septic arthritis vs auto immune related. S/p washout. F/u cultures are negative.    [facility] 05/30/2021 10:25 AM  ___________________________________________________________  CC: ESRD, COVID, strep pneumo bacteremia  Interval History/Subjective: no new c/o's   Medications:  Current Facility-Administered Medications  Medication Dose Route Frequency Provider Last Rate Last Admin   0.9 %  sodium chloride infusion  250 mL Intravenous PRN [name], [name], MD       acetaminophen (TYLENOL) tablet 650 mg  650 mg Oral Q6H PRN Geralynn Rile, MD   650 mg at 05/20/21 1478   Or   acetaminophen (TYLENOL) suppository 650 mg  650 mg Rectal Q6H PRN Geralynn Rile, MD       amLODipine (NORVASC) tablet 10 mg  10 mg Oral Daily Geralynn Rile, MD   10 mg at 05/31/21 0933    carvedilol (COREG) tablet 3.125 mg  3.125 mg Oral BID WC Lala Lund K, MD   3.125 mg at 05/31/21 0900   ceFAZolin (ANCEF) IVPB 1 g/50 mL premix  1 g Intravenous Q24H Laurice Record, MD 100 mL/hr at 05/30/21 2005 1 g at 05/30/21 2005   Chlorhexidine Gluconate Cloth 2 % PADS 6 each  6 each Topical Q0600 Geralynn Rile, MD   6 each at 05/31/21 0900   cinacalcet (SENSIPAR) tablet 60 mg  60 mg Oral Q T,Th,Sa-HD Geralynn Rile, MD   60 mg at 05/30/21 1728   doxercalciferol (HECTOROL) injection 6 mcg  6 mcg Intravenous Q T,Th,Sa-HD Geralynn Rile, MD   6 mcg at 05/30/21 1720   famotidine (PEPCID) tablet 20 mg  20 mg Oral Daily Geralynn Rile, MD   20 mg at 05/31/21 0932   feeding supplement (NEPRO CARB STEADY) liquid 237 mL  237 mL Oral BID BM Thurnell Lose, MD   237 mL at 05/31/21 0933   heparin injection 5,000 Units  5,000 Units Subcutaneous Q8H Geralynn Rile, MD   5,000 Units at 05/31/21 0544   hydrALAZINE (APRESOLINE) injection 10 mg  10 mg Intravenous Q6H PRN Geralynn Rile, MD   10 mg at 05/25/21 1101   multivitamin (RENA-VIT) tablet 1 tablet  1 tablet Oral QHS Geralynn Rile, MD   1 tablet at 05/30/21 2023  naloxone (NARCAN) injection 0.4 mg  0.4 mg Intravenous PRN [name], [name], MD       nicotine (NICODERM CQ - dosed in mg/24 hours) patch 14 mg  14 mg Transdermal Daily [name], [name], MD   14 mg at 05/31/21 6283   oxyCODONE (Oxy IR/ROXICODONE) immediate release tablet 10 mg  10 mg Oral Q4H PRN Geralynn Rile, MD   10 mg at 05/30/21 2354   predniSONE (DELTASONE) tablet 40 mg  40 mg Oral Q breakfast Thurnell Lose, MD   40 mg at 05/31/21 0900   sodium chloride flush (NS) 0.9 % injection 3 mL  3 mL Intravenous Q12H Geralynn Rile, MD   3 mL at 05/31/21 0933   sodium chloride flush (NS) 0.9 % injection 3 mL  3 mL Intravenous PRN Geralynn Rile, MD          Review of Systems: 10 systems reviewed and  negative except per interval history/subjective  Physical Exam: Vitals:   05/31/21 0757 05/31/21 0932  BP: 92/63 118/62  Pulse: 84   Resp: 16   Temp: 97.7 F (36.5 C)   SpO2: 100%    No intake/output data recorded.  Intake/Output Summary (Last 24 hours) at 05/31/2021 1025 Last data filed at 05/30/2021 2023 Gross per 24 hour  Intake 410 ml  Output 2465 ml  Net -2055 ml    Constitutional: well-appearing, tired but no distress ENMT: ears and nose without scars or lesions, MMM CV: normal rate, left wrist covered in bandage Respiratory: bilateral chest rise, normal work of breathing Gastrointestinal: soft, no distension Skin: no visible lesions or rashes Psych: alert, appropriate mood and affect   Test Results I personally reviewed new and old clinical labs and radiology tests Lab Results  Component Value Date   NA 134 (L) 05/30/2021   K 4.0 05/30/2021   CL 96 (L) 05/30/2021   CO2 27 05/30/2021   BUN 21 (H) 05/30/2021   CREATININE 3.53 (H) 05/30/2021   CALCIUM 8.8 (L) 05/30/2021   ALBUMIN 2.2 (L) 05/30/2021   PHOS 3.4 05/30/2021

## 2021-06-01 LAB — COMPREHENSIVE METABOLIC PANEL WITH GFR
ALT: <5 U/L (ref 0–44)
AST: 17 U/L (ref 15–41)
Albumin: 2.1 g/dL — ABNORMAL LOW (ref 3.5–5.0)
Alkaline Phosphatase: 54 U/L (ref 38–126)
Anion gap: 13 (ref 5–15)
BUN: 55 mg/dL — ABNORMAL HIGH (ref 6–20)
CO2: 25 mmol/L (ref 22–32)
Calcium: 8.5 mg/dL — ABNORMAL LOW (ref 8.9–10.3)
Chloride: 94 mmol/L — ABNORMAL LOW (ref 98–111)
Creatinine, Ser: 7.16 mg/dL — ABNORMAL HIGH (ref 0.44–1.00)
GFR, Estimated: 6 mL/min — ABNORMAL LOW
Glucose, Bld: 104 mg/dL — ABNORMAL HIGH (ref 70–99)
Potassium: 4.7 mmol/L (ref 3.5–5.1)
Sodium: 132 mmol/L — ABNORMAL LOW (ref 135–145)
Total Bilirubin: 0.2 mg/dL — ABNORMAL LOW (ref 0.3–1.2)
Total Protein: 6.4 g/dL — ABNORMAL LOW (ref 6.5–8.1)

## 2021-06-01 LAB — CBC WITH DIFFERENTIAL/PLATELET
Abs Immature Granulocytes: 0.19 10*3/uL — ABNORMAL HIGH (ref 0.00–0.07)
Basophils Absolute: 0 10*3/uL (ref 0.0–0.1)
Basophils Relative: 0 %
Eosinophils Absolute: 0 10*3/uL (ref 0.0–0.5)
Eosinophils Relative: 0 %
HCT: 25.6 % — ABNORMAL LOW (ref 36.0–46.0)
Hemoglobin: 8.1 g/dL — ABNORMAL LOW (ref 12.0–15.0)
Immature Granulocytes: 2 %
Lymphocytes Relative: 12 %
Lymphs Abs: 1.3 10*3/uL (ref 0.7–4.0)
MCH: 29.2 pg (ref 26.0–34.0)
MCHC: 31.6 g/dL (ref 30.0–36.0)
MCV: 92.4 fL (ref 80.0–100.0)
Monocytes Absolute: 0.9 10*3/uL (ref 0.1–1.0)
Monocytes Relative: 9 %
Neutro Abs: 8.3 10*3/uL — ABNORMAL HIGH (ref 1.7–7.7)
Neutrophils Relative %: 77 %
Platelets: 343 10*3/uL (ref 150–400)
RBC: 2.77 MIL/uL — ABNORMAL LOW (ref 3.87–5.11)
RDW: 19.9 % — ABNORMAL HIGH (ref 11.5–15.5)
WBC: 10.7 10*3/uL — ABNORMAL HIGH (ref 4.0–10.5)
nRBC: 0 % (ref 0.0–0.2)

## 2021-06-01 LAB — AEROBIC/ANAEROBIC CULTURE W GRAM STAIN (SURGICAL/DEEP WOUND)
Culture: NO GROWTH
Gram Stain: NONE SEEN

## 2021-06-01 LAB — PROCALCITONIN: Procalcitonin: 0.62 ng/mL

## 2021-06-01 LAB — C-REACTIVE PROTEIN: CRP: 2.5 mg/dL — ABNORMAL HIGH

## 2021-06-01 NOTE — Progress Notes (Signed)
Contacted TOC staff to confirm that snf that pt will be going to at d/c is able to provide transport to [place] on T[facility] (6:10 arrival/6:30 chair). Will await response. Will assist as needed.    Melven Sartorius Renal Navigator [phone]

## 2021-06-01 NOTE — Progress Notes (Signed)
PROGRESS NOTE        PATIENT DETAILS Name: Kathleen [name] Age: 61 y.o. Sex: female Date of Birth: [birth year] Admit Date: 05/15/2021 Admitting Physician Orma Flaming, MD [id number], Beverly Gust, NP  Brief Narrative:  Patient is a 61 y.o. female with history of ESRD on HD TTS, HFrEF, SLE, anemia due to CKD, HTN-who presented with at least 1 week history of worsening weakness/lethargy, intermittent diarrhea, hematochezia-she had missed her last 3 HD sessions prior to hospitalization due to weakness/lethargy.  She was found to have COVID-19 infection-and thought to have uremic encephalopathy in the setting of missed HD.  She stabilized with Remdesivir and hemodialysis.  Unfortunately-Hospital course was complicated by severe arthralgias mostly in her left wrist/spine-low-grade fever-she was subsequently found to have streptococcal pneumonia bacteremia.  See below for further details.  Significant Hospital events 1/06>> admit with diarrhea/intermittent hematochezia from COVID-uremic encephalopathy from missed HD. 1/10>> developed arthralgias-mostly in her left wrist.Prednisone ordered 1/11>> low-grade fever-worsening left wrist arthralgia-CT left wrist negative for effusion.   1/11>> blood culture positive Streptococcus pneumoniae-started on IV Rocephin. 1/19 Dr Apolonio Schneiders - Left wrist radiocarpal and midcarpal joint arthrotomy exploration and drainage  Subjective:  Patient in bed, appears comfortable, denies any headache, no fever, no chest pain or pressure, no shortness of breath , no abdominal pain. No new focal weakness. Improved L. Wrist pain.   Objective: Vitals: Blood pressure 107/81, pulse 82, temperature (!) 97.4 F (36.3 C), temperature source Oral, resp. rate 17, height 5\' 4"  (1.626 m), weight 51.1 kg, SpO2 100 %.   Exam:  Awake Alert, No new F.N deficits, Normal affect Lucien.AT,PERRAL Supple Neck, No JVD,   Symmetrical Chest wall movement, Good air movement  bilaterally, CTAB RRR,No Gallops, Rubs or new Murmurs,  +ve B.Sounds, Abd Soft, No tenderness,   Left hand and wrist along with forearm under bandage.  Mild swelling in her left hand, sensation intact in her fingers with good range of motion.    Assessment/Plan:  COVID-19 infection: Improved-diarrhea has resolved-no PNA on CXR-completed 3 days of Remdesivir, repeat Covid test -ve.  Streptococcal pneumonia with bacteremia: Developed low-grade fever/left wrist arthralgia/back pain on 1/10-1/11-blood cultures are positive for Streptococcus pneumonia.  No PNA on CXR.  TTE without vegetations.  MRI with suspicious septic arthritis at L4-L5 level-Per IR-this is not amenable to drainage.  TEE stable on 05/25/2021.  Currently on IV cefazolin per ID with stop date of 07/01/2021.  Repeat blood cultures drawn on 05/21/2021 are negative so far.  Case discussed with neurosurgeon Dr. Kathyrn Sheriff including the MRI findings, does not think these findings are of any significance.  No further work-up for this issue of nonspecific MRI changes.   Left wrist septic arthritis: Initially her exam was unremarkable and CT left wrist unremarkable, subsequently developed more swelling there after MRI was repeated suggesting septic arthritis, she was seen by orthopedic surgeon Dr. Apolonio Schneiders and underwent Left wrist radiocarpal and midcarpal joint arthrotomy exploration and drainage on 05/27/2021.  Clinically improving now continue to monitor with IV antibiotics.  Follow culture results from the joint washout, continue IV Cefazolin tentative stop date 07/01/21.  Have requested orthopedics to evaluate her 1 more time on 05/30/2021 as she is having some postop pain and swelling, continue to monitor closely for the next 1 to 2 days    Case discussed with Dr. Apolonio Schneiders over the phone on  05/30/2021 also with his partner on call Dr. Susie Cassette PA on the same date.   SLE: Claims to have a longstanding history of lupus-followed by rheumatology at [facility].  Per patient-her Plaquenil was discontinued by her nephrologist several months ago-I currently on oral prednisone 40 mg daily, plan is post discharge follow-up with her rheumatologist at [facility].  ESRD: Nephrology following-we will defer hemodialysis and electrolyte management to nephrology service.  Chronic systolic heart failure (EF 45-50% on 03/13/2021): Volume status is stable.  Watch closely.  Diarrhea with intermittent hematochezia: Diarrhea due to COVID-19 infection-suspect hematochezia was in the setting of some mucosal sloughing.  Hematochezia and diarrhea have resolved.  Given significant debility/deconditioning/stable hemoglobin-suspect we can postpone endoscopic evaluation to the outpatient setting.  Discussed this with patient-she agrees.    Acute metabolic encephalopathy: Resolved.  Due to uremia in the setting of missed HD with some contribution from COVID-19.    HTN: BP high on Norvasc have added Coreg and as needed hydralazine.  Prolonged QTC: Appears to have develops some amount of QTC elongation over the past few months-repeat EKG on 1/11 and 1/13 with stable/normalized QTC.    We will attempt to keep K> 4, and Mg>2.  Monitor EKGs periodically-continue telemetry monitoring.    Tobacco abuse: Continue transdermal nicotine  LLL Nodule - outpt Pulm follow up, within a month of DC.    Deconditioning/debility: Likely due to COVID-19 infection-and worsening uremia from missed HD.  Hopefully will continue to improve with further hemodialysis-improvement in underlying COVID-19 infection.  Evaluated by PT services-felt to require SNF on discharge.  Social worker following.   BMI Estimated body mass index is 19.34 kg/m as calculated from the following:   Height as of this encounter: 5\' 4"  (1.626 m).   Weight as of this encounter: 51.1 kg.     Consults: ID, nephrology.  Neurosurgeon Dr. Kathyrn Sheriff on 05/24/2021 over the phone, orthopedics DVT  Prophylaxis: SQ heparin Code Status:Full code  Family Communication: None at bedside  Disposition Plan: Status is: Inpatient  Severe debility/deconditioning-now with worsening encephalopathy due to uremia-needs hemodialysis today.  On IV Remdesivir for COVID.  SNF planned on discharge.  Diet: Diet Order             Diet regular Room service appropriate? Yes; Fluid consistency: Thin  Diet effective now                    MEDICATIONS: Scheduled Meds:  amLODipine  10 mg Oral Daily   carvedilol  3.125 mg Oral BID WC   Chlorhexidine Gluconate Cloth  6 each Topical Q0600   cinacalcet  60 mg Oral Q T,Th,Sa-HD   doxercalciferol  6 mcg Intravenous Q T,Th,Sa-HD   famotidine  20 mg Oral Daily   feeding supplement (NEPRO CARB STEADY)  237 mL Oral BID BM   heparin injection (subcutaneous)  5,000 Units Subcutaneous Q8H   multivitamin  1 tablet Oral QHS   nicotine  14 mg Transdermal Daily   predniSONE  40 mg Oral Q breakfast   sodium chloride flush  3 mL Intravenous Q12H   Continuous Infusions:  sodium chloride      ceFAZolin (ANCEF) IV 1 g (05/31/21 2024)   PRN Meds:.sodium chloride, acetaminophen **OR** acetaminophen, hydrALAZINE, naLOXone (NARCAN)  injection, oxyCODONE, sodium chloride flush   I have personally reviewed following labs and imaging studies  LABORATORY DATA:  Recent Labs  Lab 05/26/21 0252 05/27/21 0116 05/28/21 0204 05/30/21 1641 06/01/21 0201  WBC 12.1* 9.3 10.5 11.9* 10.7*  HGB 9.2* 9.3* 9.6* 9.5* 8.1*  HCT 28.3* 28.3* 30.8* 29.7* 25.6*  PLT 254 288 339 409* 343  MCV 87.6 87.1 90.1 90.5 92.4  MCH 28.5 28.6 28.1 29.0 29.2  MCHC 32.5 32.9 31.2 32.0 31.6  RDW 19.3* 19.2* 19.3* 19.5* 19.9*  LYMPHSABS 0.8 1.4 0.7  --  1.3  MONOABS 0.6 0.8 0.4  --  0.9  EOSABS 0.0 0.0 0.0  --  0.0  BASOSABS 0.0 0.0 0.0  --  0.0    Recent Labs  Lab 05/26/21 0252 05/27/21 0116 05/28/21 0427 05/30/21 1827 06/01/21 0201  NA 132* 133* 131* 134* 132*  K 6.0* 3.6  5.3* 4.0 4.7  CL 97* 94* 95* 96* 94*  CO2 23 28 25 27 25   GLUCOSE 94 100* 104* 133* 104*  BUN 73* 37* 57* 21* 55*  CREATININE 7.65* 4.92* 6.93* 3.53* 7.16*  CALCIUM 8.5* 8.1* 8.2* 8.8* 8.5*  AST 15 20 16   --  17  ALT 11 12 9   --  <5  ALKPHOS 63 58 64  --  54  BILITOT 0.4 0.4 0.2*  --  0.2*  ALBUMIN 1.8* 1.9* 1.9* 2.2* 2.1*  MG 2.5* 2.2 2.5*  --   --   CRP 9.6* 7.0* 8.9*  --  2.5*  PROCALCITON 2.77 1.79 1.52  --  0.62    RADIOLOGY STUDIES/RESULTS: No results found.   LOS: 16 days   Signature  Lala Lund M.D on 06/01/2021 at 10:19 AM   -  To page go to [email or web address]

## 2021-06-01 NOTE — TOC Progression Note (Signed)
Transition of Care [facility]) - Progression Note    Patient Details  Name: Latonda Larrivee MRN: [id number] Date of Birth: 1960/07/04  Transition of Care [facility]) CM/SW Contact  Emeterio Reeve, [place] Phone Number: 06/01/2021, 4:36 PM  Clinical Narrative:     CSW gave pt bed offers. Pt chose [facility] due to it being in [place]. CSW confirmed that they can accept pt at DC.   [facility] requested pts HD time be later in the day, CSW relayed request to HD coordinator.   Expected Discharge Plan: Skilled Nursing Facility Barriers to Discharge: Ship broker, Continued Medical Work up, SNF Covid  Expected Discharge Plan and Services Expected Discharge Plan: [place] arrangements for the past 2 months: Single Family Home                                       Social Determinants of Health (SDOH) Interventions    Readmission Risk Interventions No flowsheet data found.  Emeterio Reeve, LCSW Clinical Social Worker

## 2021-06-01 NOTE — Progress Notes (Signed)
Subjective: 5 Days Post-Op Procedure(s) (LRB): IRRIGATION AND DEBRIDEMENT OF WRIST (Left) Patient reports pain as moderate and aching .  She denies numbness or tingling but does continue to have swelling in the fingers.  She denies having any fever, chills, or night sweats.   Objective: Vital signs in last 24 hours: Temp:  [97.4 F (36.3 C)-98.3 F (36.8 C)] 97.4 F (36.3 C) (01/23 0735) Pulse Rate:  [82-84] 82 (01/23 0735) Resp:  [16-17] 17 (01/23 0735) BP: (107-129)/(62-81) 107/81 (01/23 0735) SpO2:  [87 %-100 %] 100 % (01/23 0735)  Intake/Output from previous day: 01/22 0701 - 01/23 0700 In: 880 [P.O.:780; IV Piggyback:100] Out: -  Intake/Output this shift: No intake/output data recorded.  Recent Labs    05/30/21 1641 06/01/21 0201  HGB 9.5* 8.1*   Recent Labs    05/30/21 1641 06/01/21 0201  WBC 11.9* 10.7*  RBC 3.28* 2.77*  HCT 29.7* 25.6*  PLT 409* 343   Recent Labs    05/30/21 1827 06/01/21 0201  NA 134* 132*  K 4.0 4.7  CL 96* 94*  CO2 27 25  BUN 21* 55*  CREATININE 3.53* 7.16*  GLUCOSE 133* 104*  CALCIUM 8.8* 8.5*   No results for input(s): LABPT, INR in the last 72 hours.    A&O x 3  No erythema or drainage from the incision Bandage is C/D/I Sutures remain intact.  Capillary refill less than 2 seconds Sensation intact to light touch distally.  Able to gently wiggle all digits with mild discomfort.   Assessment/Plan: 5 Days Post-Op Procedure(s) (LRB): IRRIGATION AND DEBRIDEMENT OF WRIST (Left)  The bandage was changed and the splint put back on.  Continue to keep the splint in place and keep it clean and dry.  Able to gently wiggle all digits as tolerated.  Continue with treatment per IM. She will follow up with our office as an outpatient.      Kathleen [name] 06/01/2021, 8:43 AM

## 2021-06-01 NOTE — Progress Notes (Signed)
Nephrology Follow-Up Consult note   Assessment/Recommendations: Kathleen [name] is a/an 61 y.o. female with a past medical history significant for ESRD, admitted for COVID and was found to have strep pneumo bacteremia.     [name]   3h 57min  50.8kg 350/1.5  2/2 bath F160  LUE AVG    - last post weight 1/3 was 48.2 kg  - sensipar 60 mg three times a week  - hectorol 6 mcg IV tiw  - venofer 100 mg weekly  - mircera 75 mcg q 2wks last 12/20, held 1/3 due to [place]  ESRD: HD on TTS schedule. HD tomorrow.   Strep pneumo bacteremia: finished course of IV antibiotics thru 07/01/21.  Followed by ID.  TEE negative.  COVID-pneumonia: s/p treatment by primary team  EDW/BP: minimal wt gains, close to dry wt here. No vol issues. Several times here was at 48kg, possibly needs new lower dry wt around 48- 49kg  GI bleeding: intermittent hematochezia. Outpatient endoscopic eval. CTM per primary  Anemia of CKD:  no esa currently but will consider. No iron given infection  Secondary hyperparathyroidism: Continue Hectorol and Sensipar.  Phos has been low. Hold binder  SLE: on prednisone per primary  Wrist swelling: with concern of septic arthritis vs auto immune related. S/p washout. F/u cultures are negative.   Dispo: awaiting SNF placement I believe   [facility] 05/30/2021 2:43 PM  ___________________________________________________________  CC: ESRD, COVID, strep pneumo bacteremia  Interval History/Subjective: no new c/o's, walking in the halls w/ PT   Medications:  Current Facility-Administered Medications  Medication Dose Route Frequency Provider Last Rate Last Admin   0.9 %  sodium chloride infusion  250 mL Intravenous PRN [name], [name], MD       acetaminophen (TYLENOL) tablet 650 mg  650 mg Oral Q6H PRN Geralynn Rile, MD   650 mg at 05/20/21 7341   Or   acetaminophen (TYLENOL) suppository 650 mg  650 mg Rectal Q6H PRN  Geralynn Rile, MD       amLODipine (NORVASC) tablet 10 mg  10 mg Oral Daily [name], [name], MD   10 mg at 06/01/21 1022   carvedilol (COREG) tablet 3.125 mg  3.125 mg Oral BID WC Lala Lund K, MD   3.125 mg at 06/01/21 1021   ceFAZolin (ANCEF) IVPB 1 g/50 mL premix  1 g Intravenous Q24H Laurice Record, MD 100 mL/hr at 05/31/21 2024 1 g at 05/31/21 2024   Chlorhexidine Gluconate Cloth 2 % PADS 6 each  6 each Topical Q0600 Geralynn Rile, MD   6 each at 06/01/21 1023   cinacalcet (SENSIPAR) tablet 60 mg  60 mg Oral Q T,Th,Sa-HD Geralynn Rile, MD   60 mg at 05/30/21 1728   doxercalciferol (HECTOROL) injection 6 mcg  6 mcg Intravenous Q T,Th,Sa-HD Geralynn Rile, MD   6 mcg at 05/30/21 1720   famotidine (PEPCID) tablet 20 mg  20 mg Oral Daily Geralynn Rile, MD   20 mg at 06/01/21 1022   feeding supplement (NEPRO CARB STEADY) liquid 237 mL  237 mL Oral BID BM Thurnell Lose, MD   237 mL at 06/01/21 1024   heparin injection 5,000 Units  5,000 Units Subcutaneous Q8H Geralynn Rile, MD   5,000 Units at 06/01/21 0519   hydrALAZINE (APRESOLINE) injection 10 mg  10 mg Intravenous Q6H PRN Geralynn Rile, MD   10 mg at 05/25/21 1101  multivitamin (RENA-VIT) tablet 1 tablet  1 tablet Oral QHS Geralynn Rile, MD   1 tablet at 05/31/21 2020   naloxone [facility]) injection 0.4 mg  0.4 mg Intravenous PRN Geralynn Rile, MD       nicotine (NICODERM CQ - dosed in mg/24 hours) patch 14 mg  14 mg Transdermal Daily [name], [name], MD   14 mg at 06/01/21 1022   oxyCODONE (Oxy IR/ROXICODONE) immediate release tablet 10 mg  10 mg Oral Q4H PRN Geralynn Rile, MD   10 mg at 06/01/21 1022   predniSONE (DELTASONE) tablet 40 mg  40 mg Oral Q breakfast Thurnell Lose, MD   40 mg at 06/01/21 1022   sodium chloride flush (NS) 0.9 % injection 3 mL  3 mL Intravenous Q12H Geralynn Rile, MD   3 mL at 05/31/21 2024   sodium chloride  flush (NS) 0.9 % injection 3 mL  3 mL Intravenous PRN Geralynn Rile, MD          Review of Systems: 10 systems reviewed and negative except per interval history/subjective  Physical Exam: Vitals:   06/01/21 0735 06/01/21 1441  BP: 107/81 139/84  Pulse: 82 83  Resp: 17 16  Temp: (!) 97.4 F (36.3 C) (!) 97.3 F (36.3 C)  SpO2: 100%    Total I/O In: 400 [P.O.:400] Out: -   Intake/Output Summary (Last 24 hours) at 06/01/2021 1443 Last data filed at 06/01/2021 1300 Gross per 24 hour  Intake 860 ml  Output --  Net 860 ml    Constitutional: well-appearing, tired but no distress ENMT: ears and nose without scars or lesions, MMM CV: normal rate, left wrist covered in bandage Respiratory: bilateral chest rise, normal work of breathing Gastrointestinal: soft, no distension Skin: no visible lesions or rashes Psych: alert, appropriate mood and affect   Test Results I personally reviewed new and old clinical labs and radiology tests Lab Results  Component Value Date   NA 132 (L) 06/01/2021   K 4.7 06/01/2021   CL 94 (L) 06/01/2021   CO2 25 06/01/2021   BUN 55 (H) 06/01/2021   CREATININE 7.16 (H) 06/01/2021   CALCIUM 8.5 (L) 06/01/2021   ALBUMIN 2.1 (L) 06/01/2021   PHOS 3.4 05/30/2021

## 2021-06-01 NOTE — Progress Notes (Signed)
Mobility Specialist Progress Note:   06/01/21 1400  Mobility  Activity Ambulated independently in hallway  Level of Assistance Independent  Assistive Device None  Distance Ambulated (ft) 2200 ft  Activity Response Tolerated well  $Mobility charge 1 Mobility   Pt asx during ambulation. No LOBs or unsteadiness noted. Pt back sitting EOB with all needs met.   Nelta Numbers Mobility Specialist  Phone [phone]

## 2021-06-02 LAB — COMPREHENSIVE METABOLIC PANEL
ALT: <5 U/L (ref 0–44)
AST: 14 U/L — ABNORMAL LOW (ref 15–41)
Albumin: 2.1 g/dL — ABNORMAL LOW (ref 3.5–5.0)
Alkaline Phosphatase: 56 U/L (ref 38–126)
Anion gap: 12 (ref 5–15)
BUN: 74 mg/dL — ABNORMAL HIGH (ref 6–20)
CO2: 26 mmol/L (ref 22–32)
Calcium: 8.7 mg/dL — ABNORMAL LOW (ref 8.9–10.3)
Chloride: 95 mmol/L — ABNORMAL LOW (ref 98–111)
Creatinine, Ser: 9.28 mg/dL — ABNORMAL HIGH (ref 0.44–1.00)
GFR, Estimated: 4 mL/min — ABNORMAL LOW (ref >60)
Glucose, Bld: 97 mg/dL (ref 70–99)
Potassium: 5 mmol/L (ref 3.5–5.1)
Sodium: 133 mmol/L — ABNORMAL LOW (ref 135–145)
Total Bilirubin: 0.4 mg/dL (ref 0.3–1.2)
Total Protein: 6.1 g/dL — ABNORMAL LOW (ref 6.5–8.1)

## 2021-06-02 LAB — CBC WITH DIFFERENTIAL/PLATELET
Abs Immature Granulocytes: 0.07 10*3/uL (ref 0.00–0.07)
Basophils Absolute: 0 10*3/uL (ref 0.0–0.1)
Basophils Relative: 0 %
Eosinophils Absolute: 0 10*3/uL (ref 0.0–0.5)
Eosinophils Relative: 0 %
HCT: 25.4 % — ABNORMAL LOW (ref 36.0–46.0)
Hemoglobin: 8.1 g/dL — ABNORMAL LOW (ref 12.0–15.0)
Immature Granulocytes: 1 %
Lymphocytes Relative: 11 %
Lymphs Abs: 1.1 10*3/uL (ref 0.7–4.0)
MCH: 29.2 pg (ref 26.0–34.0)
MCHC: 31.9 g/dL (ref 30.0–36.0)
MCV: 91.7 fL (ref 80.0–100.0)
Monocytes Absolute: 0.8 10*3/uL (ref 0.1–1.0)
Monocytes Relative: 8 %
Neutro Abs: 7.4 10*3/uL (ref 1.7–7.7)
Neutrophils Relative %: 80 %
Platelets: 335 10*3/uL (ref 150–400)
RBC: 2.77 MIL/uL — ABNORMAL LOW (ref 3.87–5.11)
RDW: 19.9 % — ABNORMAL HIGH (ref 11.5–15.5)
WBC: 9.3 10*3/uL (ref 4.0–10.5)
nRBC: 0 % (ref 0.0–0.2)

## 2021-06-02 LAB — PROCALCITONIN: Procalcitonin: 0.52 ng/mL

## 2021-06-02 LAB — C-REACTIVE PROTEIN: CRP: 2 mg/dL — ABNORMAL HIGH (ref <1.0)

## 2021-06-02 MED ORDER — CEFAZOLIN SODIUM-DEXTROSE 2-4 GM/100ML-% IV SOLN
2.0000 g | INTRAVENOUS | Status: DC
  Administered 2021-06-04: 2 g via INTRAVENOUS
  Filled 2021-06-02 (×2): qty 100

## 2021-06-02 MED ORDER — PREDNISONE 5 MG PO TABS
15.0000 mg | ORAL_TABLET | Freq: Every day | ORAL | Status: DC
  Administered 2021-06-02 – 2021-06-03 (×2): 15 mg via ORAL
  Filled 2021-06-02 (×2): qty 1

## 2021-06-02 MED ORDER — NEPRO/CARBSTEADY PO LIQD
237.0000 mL | ORAL | Status: AC
  Administered 2021-06-02: 09:00:00 237 mL via ORAL

## 2021-06-02 NOTE — Progress Notes (Signed)
Nephrology Follow-Up Consult note   Summary: Kathleen [name] is a/an 61 y.o. female with a past medical history significant for ESRD, admitted for COVID and was found to have strep pneumo bacteremia.     [name]   3h 41min  50.8kg 350/1.5  2/2 bath F160  LUE AVG    - last post weight 1/3 was 48.2 kg  - sensipar 60 mg three times a week  - hectorol 6 mcg IV tiw  - venofer 100 mg weekly  - mircera 75 mcg q 2wks last 12/20, held 1/3 due to [place]   Assessment/Recommendations: ESRD: HD on TTS schedule. HD today.    Strep pneumo bacteremia: getting course of IV Ancef, thru 07/01/21.  Followed by ID.  TEE negative.  COVID-pneumonia: s/p treatment by primary team. Off precautions.   EDW/BP: minimal wt gains, close to dry wt here. No vol excess, possibly new lower dry wt around 48- 49kg  GI bleeding: intermittent hematochezia. Outpatient endoscopic eval. CTM per primary  Anemia of CKD:  no esa currently but will consider. No iron given infection  Secondary hyperparathyroidism: Continue Hectorol and Sensipar.  Phos has been low. Hold binder  SLE: on prednisone per primary  Wrist swelling: with concern of septic arthritis vs auto immune related. S/p washout. F/u cultures are negative.   Dispo: possibly going home, per [facility] 05/30/2021 1:34 PM  ___________________________________________________________  CC: ESRD, COVID, strep pneumo bacteremia  Interval History/Subjective: no new c/o's, walking in the halls w/ PT   Medications:  Current Facility-Administered Medications  Medication Dose Route Frequency Provider Last Rate Last Admin   0.9 %  sodium chloride infusion  250 mL Intravenous PRN [name], [name], MD       acetaminophen (TYLENOL) tablet 650 mg  650 mg Oral Q6H PRN Geralynn Rile, MD   650 mg at 05/20/21 1950   Or   acetaminophen (TYLENOL) suppository 650 mg  650 mg Rectal Q6H PRN Geralynn Rile,  MD       amLODipine (NORVASC) tablet 10 mg  10 mg Oral Daily Geralynn Rile, MD   10 mg at 06/02/21 0928   carvedilol (COREG) tablet 3.125 mg  3.125 mg Oral BID WC Thurnell Lose, MD   3.125 mg at 06/02/21 9326   ceFAZolin (ANCEF) IVPB 2g/100 mL premix  2 g Intravenous Q T,Th,Sa-HD Franky Macho, RPH       Chlorhexidine Gluconate Cloth 2 % PADS 6 each  6 each Topical Q0600 [name], [name], MD   6 each at 06/02/21 0928   cinacalcet (SENSIPAR) tablet 60 mg  60 mg Oral Q T,Th,Sa-HD Geralynn Rile, MD   60 mg at 05/30/21 1728   doxercalciferol (HECTOROL) injection 6 mcg  6 mcg Intravenous Q T,Th,Sa-HD Geralynn Rile, MD   6 mcg at 05/30/21 1720   famotidine (PEPCID) tablet 20 mg  20 mg Oral Daily Geralynn Rile, MD   20 mg at 06/02/21 0928   feeding supplement (NEPRO CARB STEADY) liquid 237 mL  237 mL Oral BID BM Thurnell Lose, MD   237 mL at 06/02/21 0929   heparin injection 5,000 Units  5,000 Units Subcutaneous Q8H Geralynn Rile, MD   5,000 Units at 06/02/21 7124   hydrALAZINE (APRESOLINE) injection 10 mg  10 mg Intravenous Q6H PRN Geralynn Rile, MD   10 mg at 05/25/21 1101   multivitamin (RENA-VIT) tablet 1  tablet  1 tablet Oral QHS Geralynn Rile, MD   1 tablet at 06/01/21 2218   naloxone [facility]) injection 0.4 mg  0.4 mg Intravenous PRN Geralynn Rile, MD       nicotine (NICODERM CQ - dosed in mg/24 hours) patch 14 mg  14 mg Transdermal Daily Geralynn Rile, MD   14 mg at 06/02/21 7741   oxyCODONE (Oxy IR/ROXICODONE) immediate release tablet 10 mg  10 mg Oral Q4H PRN Geralynn Rile, MD   10 mg at 06/02/21 2878   predniSONE (DELTASONE) tablet 15 mg  15 mg Oral Q breakfast Thurnell Lose, MD   15 mg at 06/02/21 6767   sodium chloride flush (NS) 0.9 % injection 3 mL  3 mL Intravenous Q12H Geralynn Rile, MD   3 mL at 06/02/21 0929   sodium chloride flush (NS) 0.9 % injection 3 mL  3 mL Intravenous PRN  Geralynn Rile, MD          Review of Systems: 10 systems reviewed and negative except per interval history/subjective  Physical Exam: Vitals:   06/02/21 0440 06/02/21 0743  BP: 133/68 113/82  Pulse: 79   Resp: 18 17  Temp: 97.8 F (36.6 C) 97.8 F (36.6 C)  SpO2: 100%    No intake/output data recorded.  Intake/Output Summary (Last 24 hours) at 06/02/2021 1334 Last data filed at 06/01/2021 1700 Gross per 24 hour  Intake 474 ml  Output --  Net 474 ml    Constitutional: well-appearing, tired but no distress ENMT: ears and nose without scars or lesions, MMM CV: normal rate, left wrist covered in bandage Respiratory: bilateral chest rise, normal work of breathing Gastrointestinal: soft, no distension Skin: no visible lesions or rashes Psych: alert, appropriate mood and affect   Test Results I personally reviewed new and old clinical labs and radiology tests Lab Results  Component Value Date   NA 133 (L) 06/02/2021   K 5.0 06/02/2021   CL 95 (L) 06/02/2021   CO2 26 06/02/2021   BUN 74 (H) 06/02/2021   CREATININE 9.28 (H) 06/02/2021   CALCIUM 8.7 (L) 06/02/2021   ALBUMIN 2.1 (L) 06/02/2021   PHOS 3.4 05/30/2021

## 2021-06-02 NOTE — Progress Notes (Signed)
PHARMACY NOTE:  ANTIMICROBIAL RENAL DOSAGE ADJUSTMENT  Current antimicrobial regimen includes a mismatch between antimicrobial dosage and estimated renal function.  As per policy approved by the Pharmacy & Therapeutics and Medical Executive Committees, the antimicrobial dosage will be adjusted accordingly.  Current antimicrobial dosage:  Ancef 1gm IV q24h   Indication: strep pneumo bacteremia  Renal Function:  Estimated Creatinine Clearance: 5.2 mL/min (A) (by C-G formula based on SCr of 9.28 mg/dL (H)). [x]      On intermittent HD, scheduled: T/T/S []      On CRRT    Antimicrobial dosage has been changed to:  Ancef 2gm IV qHD since pt back on scheduled HD (ends 2/22)   Thank you for allowing pharmacy to be a part of this patient's care.  Sherlon Handing, PharmD, BCPS Please see amion for complete clinical pharmacist phone list 06/02/2021 11:29 AM

## 2021-06-02 NOTE — Progress Notes (Signed)
Physical Therapy Discharge Patient Details Name: Kathleen [name] MRN: [id number] DOB: 1960-07-06 Today's Date: 06/02/2021 Time: 0510-7125 PT Time Calculation (min) (ACUTE ONLY): 17 min  Patient discharged from PT services secondary to goals met and no further PT needs identified.  Please see latest therapy progress note for current level of functioning and progress toward goals.    Progress and discharge plan discussed with patient and/or caregiver: Patient/Caregiver agrees with plan  GP   [name] A. Escarlet Saathoff, PT, DPT Acute Rehabilitation Services Office: [place] 06/02/2021, 11:47 AM

## 2021-06-02 NOTE — Progress Notes (Signed)
Mobility Specialist Progress Note:   06/02/21 1630  Mobility  Activity Ambulated independently in hallway  Level of Assistance Independent  Assistive Device None  Distance Ambulated (ft) 1650 ft  Activity Response Tolerated well  $Mobility charge 1 Mobility   Pt eager for OOB mobility. No LOBs/unsteadiness noted. Asx during ambulation, pt back sitting EOB with all needs met.   Nelta Numbers Mobility Specialist  Phone [phone]

## 2021-06-02 NOTE — Progress Notes (Addendum)
Contacted by CSW regarding snf request for a later out-pt HD time. Contacted [facility] and left a message for clinic manager to check clinic's availability. Will await a return call.  Melven Sartorius Renal Navigator [phone]  Addendum at 3:16 pm: Contacted by CSW that pt will likely return home with [facility] instead of going to snf. Pt can receive iv abx with HD as out-pt per nephrologist. CSW aware. Update provided to Shawn at [facility]. Will assist as needed.

## 2021-06-02 NOTE — Progress Notes (Signed)
Physical Therapy Treatment Patient Details Name: Kathleen [name] MRN: [id number] DOB: 1960/08/22 Today's Date: 06/02/2021   History of Present Illness Pt is a 61 y.o. female who presented 05/15/21 with weakness. Pt with GI bleed. work up underway, but current diagnosis:  FTT, chronic combined systolic/diastolic HF, COVID -19  PMH: ESRD on HD, COPD, HTN, migraine, lupus, seziures, anemia, cardiac arrest hx, cellulitis, cerebral vasculitis, hx of suicide attempt, TB, TIA    PT Comments    Pt amb with complete independence x 2 laps around NSG floor.  No major gait deviations or LOB with activity.  Pt is no longer appropriate for PT caseload and will be D/C'd from acute care therapy at this time.  Pt can continue to mobilize with mobility team.  Safe to return home.  Recommendations for follow up therapy are one component of a multi-disciplinary discharge planning process, led by the attending physician.  Recommendations may be updated based on patient status, additional functional criteria and insurance authorization.  Follow Up Recommendations  No PT follow up     Assistance Recommended at Discharge None  Patient can return home with the following     Equipment Recommendations       Recommendations for Other Services       Precautions / Restrictions Precautions Precautions: None     Mobility  Bed Mobility Overal bed mobility: Independent Bed Mobility: Supine to Sit     Supine to sit: Independent       Patient Response: Cooperative  Transfers Overall transfer level: Independent   Transfers: Sit to/from Stand Sit to Stand: Independent                Ambulation/Gait Ambulation/Gait assistance: Independent Gait Distance (Feet): 1200 Feet Assistive device: None Gait Pattern/deviations: WFL(Within Functional Limits)       General Gait Details: Amb 2 laps around NSG floor with complete independence.  Good cadence, no gait deviaitions, no LOB.   Stairs              Wheelchair Mobility    Modified Rankin (Stroke Patients Only)       Balance Overall balance assessment: Independent                                          Cognition Arousal/Alertness: Lethargic Behavior During Therapy: WFL for tasks assessed/performed Overall Cognitive Status: Within Functional Limits for tasks assessed                                 General Comments: Pt. sleeping when PT arrives.  Arouses easily but initially refuses PT.  States she is too tired.  Needs inc encouragement to amb in halls.        Exercises      General Comments        Pertinent Vitals/Pain Pain Assessment Pain Assessment: Faces Faces Pain Scale: Hurts a little bit Pain Location: L hand Pain Descriptors / Indicators: Aching Pain Intervention(s): Monitored during session    Home Living                          Prior Function            PT Goals (current goals can now be found in the care plan section) Progress towards PT goals:  Goals met/education completed, patient discharged from PT    Frequency           PT Plan Discharge plan needs to be updated    Co-evaluation              AM-PAC PT "6 Clicks" Mobility   Outcome Measure  Help needed turning from your back to your side while in a flat bed without using bedrails?: None Help needed moving from lying on your back to sitting on the side of a flat bed without using bedrails?: None Help needed moving to and from a bed to a chair (including a wheelchair)?: None Help needed standing up from a chair using your arms (e.g., wheelchair or bedside chair)?: None Help needed to walk in hospital room?: None Help needed climbing 3-5 steps with a railing? : None 6 Click Score: 24    End of Session   Activity Tolerance: Patient tolerated treatment well Patient left: Other (comment) (in bathroom)   PT Visit Diagnosis: History of falling (Z91.81) Pain - part of  body: Hand     Time: 1123-1140 PT Time Calculation (min) (ACUTE ONLY): 17 min  Charges:  $Gait Training: 8-22 mins                     Ahan Eisenberger A. Hailey Miles, PT, DPT Acute Rehabilitation Services Office: [phone]    Lawson Isabell A Livingston Denner 06/02/2021, 11:45 AM

## 2021-06-02 NOTE — TOC Progression Note (Signed)
Transition of Care [facility]) - Progression Note    Patient Details  Name: Kathleen [name] MRN: [id number] Date of Birth: August 14, 1960  [facility] CM/SW Contact  Emeterio Reeve, [place] Phone Number: 06/02/2021, 4:03 PM  Clinical Narrative:     CSW spoke with pt at bedside. CSW explained to pt that she has progressed and no longer needs SNF. Pt states she understands. CSW explained she is able to return home at DC. CSW explained that pt will continue to get IVABX and she will receive them at her HD clinic. Pt stated she understands.   Pt inquired about help at home for her brother that has cancer. CSW explained that he will have to discuss it with his PCP about services. Pt stated she understood. Pt inquired about someone to help with cleaning and meals. CSW explained that insurance does not cover that services, it will be a private pay expense. Pt stated she understood. Pt shared that she would like to move out and have her own place and not live with her brother. CSW explained to pt that she can apply for housing but their is a waitlist. Pt stated she understood.   Pt inquired about transportation to HD. Pt reports PTA she was driving herself to HD. CSW and pt dicussed getting transportation through [place]. Pt stated she needs that but does not want big wheels due to a past bad experiences. CSW informed pt that she will explore pts options and follow up.   Expected Discharge Plan: Skilled Nursing Facility Barriers to Discharge: Ship broker, Continued Medical Work up, SNF Covid  Expected Discharge Plan and Services Expected Discharge Plan: [place] arrangements for the past 2 months: Single Family Home                                       Social Determinants of Health (SDOH) Interventions    Readmission Risk Interventions No flowsheet data found.

## 2021-06-02 NOTE — Progress Notes (Signed)
This RN called HD RN, Pt is not scheduled for dialysis tonight. Will be placed on AM schedule per HD RN. Will proceed with HS medications.

## 2021-06-02 NOTE — Progress Notes (Signed)
PROGRESS NOTE        PATIENT DETAILS Name: Kathleen [name] Age: 61 y.o. Sex: female Date of Birth: [birth year] Admit Date: 05/15/2021 Admitting Physician Orma Flaming, MD IRW:[id number], Beverly Gust, NP  Brief Narrative:  Patient is a 61 y.o. female with history of ESRD on HD TTS, HFrEF, SLE, anemia due to CKD, HTN-who presented with at least 1 week history of worsening weakness/lethargy, intermittent diarrhea, hematochezia-she had missed her last 3 HD sessions prior to hospitalization due to weakness/lethargy.  She was found to have COVID-19 infection-and thought to have uremic encephalopathy in the setting of missed HD.  She stabilized with Remdesivir and hemodialysis.  Unfortunately-Hospital course was complicated by severe arthralgias mostly in her left wrist/spine-low-grade fever-she was subsequently found to have streptococcal pneumonia bacteremia.  See below for further details.  Significant Hospital events 1/06>> admit with diarrhea/intermittent hematochezia from COVID-uremic encephalopathy from missed HD. 1/10>> developed arthralgias-mostly in her left wrist.Prednisone ordered 1/11>> low-grade fever-worsening left wrist arthralgia-CT left wrist negative for effusion.   1/11>> blood culture positive Streptococcus pneumoniae-started on IV Rocephin. 1/19 Dr Apolonio Schneiders - Left wrist radiocarpal and midcarpal joint arthrotomy exploration and drainage  Subjective:   Patient in bed, appears comfortable, denies any headache, no fever, no chest pain or pressure, no shortness of breath , no abdominal pain. No new focal weakness.  Improving left wrist discomfort.   Objective: Vitals: Blood pressure 113/82, pulse 79, temperature 97.8 F (36.6 C), resp. rate 17, height 5\' 4"  (1.626 m), weight 51.1 kg, SpO2 100 %.   Exam:  Awake Alert, No new F.N deficits, Normal affect Magazine.AT,PERRAL Supple Neck, No JVD,   Symmetrical Chest wall movement, Good air movement bilaterally,  CTAB RRR,No Gallops, Rubs or new Murmurs,  +ve B.Sounds, Abd Soft, No tenderness,   Left hand and wrist along with forearm under bandage.  Mild swelling in her left hand, sensation intact in her fingers with good range of motion.    Assessment/Plan:  COVID-19 infection: Improved-diarrhea has resolved-no PNA on CXR-completed 3 days of Remdesivir, repeat Covid test -ve.  Streptococcal pneumonia with bacteremia: Developed low-grade fever/left wrist arthralgia/back pain on 1/10-1/11-blood cultures are positive for Streptococcus pneumonia.  No PNA on CXR.  TTE without vegetations.  MRI with suspicious septic arthritis at L4-L5 level-Per IR-this is not amenable to drainage.  TEE stable on 05/25/2021.  Currently on IV cefazolin per ID with stop date of 07/01/2021.  Repeat blood cultures drawn on 05/21/2021 are negative so far.  Case discussed with neurosurgeon Dr. Kathyrn Sheriff including the MRI findings, does not think these findings are of any significance.  No further work-up for this issue of nonspecific MRI changes.   Left wrist septic arthritis: Initially her exam was unremarkable and CT left wrist unremarkable, subsequently developed more swelling there after MRI was repeated suggesting septic arthritis, she was seen by orthopedic surgeon Dr. Apolonio Schneiders and underwent Left wrist radiocarpal and midcarpal joint arthrotomy exploration and drainage on 05/27/2021.  Clinically improving now continue to monitor with IV antibiotics.  Follow final culture results from the joint washout, continue IV Cefazolin tentative stop date 07/01/21.  Appreciate orthopedic surgery follow-up. Seen by ID as well.  SLE: Claims to have a longstanding history of lupus-followed by rheumatology at [facility].  Per patient-her Plaquenil was discontinued by her nephrologist several months ago-I currently on oral prednisone 15 mg  daily, plan is post discharge follow-up with her rheumatologist at [facility].  ESRD: Nephrology  following-we will defer hemodialysis and electrolyte management to nephrology service.  Chronic systolic heart failure (EF 45-50% on 03/13/2021): Volume status is stable.  Watch closely.  Diarrhea with intermittent hematochezia: Diarrhea due to COVID-19 infection-suspect hematochezia was in the setting of some mucosal sloughing.  Hematochezia and diarrhea have resolved.  Given significant debility/deconditioning/stable hemoglobin-suspect we can postpone endoscopic evaluation to the outpatient setting.  Discussed this with patient-she agrees.    Acute metabolic encephalopathy: Resolved.  Due to uremia in the setting of missed HD with some contribution from COVID-19.    HTN: BP high on Norvasc have added Coreg and as needed hydralazine.  Prolonged QTC: Appears to have develops some amount of QTC elongation over the past few months-repeat EKG on 1/11 and 1/13 with stable/normalized QTC.    We will attempt to keep K> 4, and Mg>2.  Monitor EKGs periodically-continue telemetry monitoring.    Tobacco abuse: Continue transdermal nicotine  LLL Nodule - outpt Pulm follow up, within a month of DC.    Deconditioning/debility: Likely due to COVID-19 infection-and worsening uremia from missed HD.  Hopefully will continue to improve with further hemodialysis-improvement in underlying COVID-19 infection.  Evaluated by PT services-felt to require SNF on discharge.  Social worker following.   BMI Estimated body mass index is 19.34 kg/m as calculated from the following:   Height as of this encounter: 5\' 4"  (1.626 m).   Weight as of this encounter: 51.1 kg.     Consults: ID, nephrology.  Neurosurgeon Dr. Kathyrn Sheriff on 05/24/2021 over the phone, orthopedics DVT Prophylaxis: SQ heparin Code Status:Full code  Family Communication: None at bedside  Disposition Plan: Status is: Inpatient  Severe debility/deconditioning-now with worsening encephalopathy due to uremia-needs hemodialysis today.  On IV  Remdesivir for COVID.  SNF planned on discharge.  Diet: Diet Order             Diet regular Room service appropriate? Yes; Fluid consistency: Thin  Diet effective now                    MEDICATIONS: Scheduled Meds:  amLODipine  10 mg Oral Daily   carvedilol  3.125 mg Oral BID WC   Chlorhexidine Gluconate Cloth  6 each Topical Q0600   cinacalcet  60 mg Oral Q T,Th,Sa-HD   doxercalciferol  6 mcg Intravenous Q T,Th,Sa-HD   famotidine  20 mg Oral Daily   feeding supplement (NEPRO CARB STEADY)  237 mL Oral BID BM   feeding supplement (NEPRO CARB STEADY)  237 mL Oral NOW   heparin injection (subcutaneous)  5,000 Units Subcutaneous Q8H   multivitamin  1 tablet Oral QHS   nicotine  14 mg Transdermal Daily   predniSONE  15 mg Oral Q breakfast   sodium chloride flush  3 mL Intravenous Q12H   Continuous Infusions:  sodium chloride      ceFAZolin (ANCEF) IV 1 g (06/01/21 1947)   PRN Meds:.sodium chloride, acetaminophen **OR** acetaminophen, hydrALAZINE, naLOXone (NARCAN)  injection, oxyCODONE, sodium chloride flush   I have personally reviewed following labs and imaging studies  LABORATORY DATA:  Recent Labs  Lab 05/27/21 0116 05/28/21 0204 05/30/21 1641 06/01/21 0201 06/02/21 0128  WBC 9.3 10.5 11.9* 10.7* 9.3  HGB 9.3* 9.6* 9.5* 8.1* 8.1*  HCT 28.3* 30.8* 29.7* 25.6* 25.4*  PLT 288 339 409* 343 335  MCV 87.1 90.1 90.5 92.4 91.7  MCH 28.6 28.1  29.0 29.2 29.2  MCHC 32.9 31.2 32.0 31.6 31.9  RDW 19.2* 19.3* 19.5* 19.9* 19.9*  LYMPHSABS 1.4 0.7  --  1.3 1.1  MONOABS 0.8 0.4  --  0.9 0.8  EOSABS 0.0 0.0  --  0.0 0.0  BASOSABS 0.0 0.0  --  0.0 0.0    Recent Labs  Lab 05/27/21 0116 05/28/21 0427 05/30/21 1827 06/01/21 0201 06/02/21 0128  NA 133* 131* 134* 132* 133*  K 3.6 5.3* 4.0 4.7 5.0  CL 94* 95* 96* 94* 95*  CO2 28 25 27 25 26   GLUCOSE 100* 104* 133* 104* 97  BUN 37* 57* 21* 55* 74*  CREATININE 4.92* 6.93* 3.53* 7.16* 9.28*  CALCIUM 8.1* 8.2* 8.8* 8.5*  8.7*  AST 20 16  --  17 14*  ALT 12 9  --  <5 <5  ALKPHOS 58 64  --  54 56  BILITOT 0.4 0.2*  --  0.2* 0.4  ALBUMIN 1.9* 1.9* 2.2* 2.1* 2.1*  MG 2.2 2.5*  --   --   --   CRP 7.0* 8.9*  --  2.5* 2.0*  PROCALCITON 1.79 1.52  --  0.62 0.52    RADIOLOGY STUDIES/RESULTS: No results found.   LOS: 17 days   Signature  Lala Lund M.D on 06/02/2021 at 8:37 AM   -  To page go to [email or web address]

## 2021-06-03 LAB — COMPREHENSIVE METABOLIC PANEL
ALT: <5 U/L (ref 0–44)
AST: 18 U/L (ref 15–41)
Albumin: 2.2 g/dL — ABNORMAL LOW (ref 3.5–5.0)
Alkaline Phosphatase: 53 U/L (ref 38–126)
Anion gap: 16 — ABNORMAL HIGH (ref 5–15)
BUN: 97 mg/dL — ABNORMAL HIGH (ref 6–20)
CO2: 22 mmol/L (ref 22–32)
Calcium: 8.8 mg/dL — ABNORMAL LOW (ref 8.9–10.3)
Chloride: 92 mmol/L — ABNORMAL LOW (ref 98–111)
Creatinine, Ser: 10.66 mg/dL — ABNORMAL HIGH (ref 0.44–1.00)
GFR, Estimated: 4 mL/min — ABNORMAL LOW (ref >60)
Glucose, Bld: 99 mg/dL (ref 70–99)
Potassium: 5.7 mmol/L — ABNORMAL HIGH (ref 3.5–5.1)
Sodium: 130 mmol/L — ABNORMAL LOW (ref 135–145)
Total Bilirubin: 0.4 mg/dL (ref 0.3–1.2)
Total Protein: 6 g/dL — ABNORMAL LOW (ref 6.5–8.1)

## 2021-06-03 LAB — CBC WITH DIFFERENTIAL/PLATELET
Abs Immature Granulocytes: 0.07 10*3/uL (ref 0.00–0.07)
Basophils Absolute: 0 10*3/uL (ref 0.0–0.1)
Basophils Relative: 0 %
Eosinophils Absolute: 0 10*3/uL (ref 0.0–0.5)
Eosinophils Relative: 0 %
HCT: 25.9 % — ABNORMAL LOW (ref 36.0–46.0)
Hemoglobin: 7.8 g/dL — ABNORMAL LOW (ref 12.0–15.0)
Immature Granulocytes: 1 %
Lymphocytes Relative: 12 %
Lymphs Abs: 1.2 10*3/uL (ref 0.7–4.0)
MCH: 28.1 pg (ref 26.0–34.0)
MCHC: 30.1 g/dL (ref 30.0–36.0)
MCV: 93.2 fL (ref 80.0–100.0)
Monocytes Absolute: 0.8 10*3/uL (ref 0.1–1.0)
Monocytes Relative: 8 %
Neutro Abs: 7.7 10*3/uL (ref 1.7–7.7)
Neutrophils Relative %: 79 %
Platelets: 338 10*3/uL (ref 150–400)
RBC: 2.78 MIL/uL — ABNORMAL LOW (ref 3.87–5.11)
RDW: 20.3 % — ABNORMAL HIGH (ref 11.5–15.5)
WBC: 9.8 10*3/uL (ref 4.0–10.5)
nRBC: 0 % (ref 0.0–0.2)

## 2021-06-03 LAB — PROCALCITONIN: Procalcitonin: 0.43 ng/mL

## 2021-06-03 LAB — C-REACTIVE PROTEIN: CRP: 1.8 mg/dL — ABNORMAL HIGH (ref <1.0)

## 2021-06-03 MED ORDER — SODIUM ZIRCONIUM CYCLOSILICATE 10 G PO PACK
10.0000 g | PACK | Freq: Three times a day (TID) | ORAL | Status: AC
  Administered 2021-06-03 (×2): 10 g via ORAL
  Filled 2021-06-03 (×2): qty 1

## 2021-06-03 NOTE — Progress Notes (Signed)
Mobility Specialist Progress Note:   06/03/21 1400  Mobility  Activity Ambulated independently in hallway  Level of Assistance Independent  Assistive Device None  Distance Ambulated (ft) 2000 ft  Activity Response Tolerated well  $Mobility charge 1 Mobility   Pt c/o generalized weakness d/t HD this am. No LOBs/unsteadiness noted during ambulation. Pt back in room with all needs met.  Nelta Numbers Mobility Specialist  Phone [phone]

## 2021-06-03 NOTE — Progress Notes (Signed)
°   06/03/21 1020  Vitals  Temp (!) 97.3 F (36.3 C)  Temp Source Oral  BP (!) 135/59  BP Location Right Leg  BP Method Automatic  Patient Position (if appropriate) Sitting  Pulse Rate 67  Pulse Rate Source Monitor  Resp 13  Oxygen Therapy  SpO2 97 %  O2 Device Room Air  Dialysis Weight  Weight 50 kg  Type of Weight Post-Dialysis  Post-Hemodialysis Assessment  Rinseback Volume (mL) 250 mL  KECN 242 V  Dialyzer Clearance Lightly streaked  Duration of HD Treatment -hour(s) 3 hour(s)  Hemodialysis Intake (mL) 500 mL  UF Total -Machine (mL) 3000 mL  Net UF (mL) 2500 mL  Tolerated HD Treatment Yes  Post-Hemodialysis Comments tx complete-pt stable  AVG/AVF Arterial Site Held (minutes) 7 minutes  AVG/AVF Venous Site Held (minutes) 7 minutes  Fistula / Graft Left Upper arm Arteriovenous vein graft  Placement Date/Time: (c) 05/16/21 1535   Placed prior to admission: Yes  Orientation: Left  Access Location: Upper arm  Access Type: (c) Arteriovenous vein graft  Site Condition No complications  Fistula / Graft Assessment Present;Thrill;Bruit  Status Deaccessed;Flushed  Drainage Description None   HD tx complete, pt stable. No problems noted throughout tx.

## 2021-06-03 NOTE — Hospital Course (Signed)
61 y.o. female with history of ESRD on HD TTS, HFrEF, SLE, anemia due to CKD, HTN-who presented with at least 1 week history of worsening weakness/lethargy, intermittent diarrhea, hematochezia-she had missed her last 3 HD sessions prior to hospitalization due to weakness/lethargy.  She was found to have COVID-19 infection-and thought to have uremic encephalopathy in the setting of missed HD.  She stabilized with Remdesivir and hemodialysis.  Unfortunately-Hospital course was complicated by severe arthralgias mostly in her left wrist/spine-low-grade fever-she was subsequently found to have streptococcal pneumonia bacteremia.

## 2021-06-03 NOTE — Progress Notes (Signed)
PROGRESS NOTE        PATIENT DETAILS Name: Kathleen [name] Age: 61 y.o. Sex: female Date of Birth: 12/06/1960 Admit Date: 05/15/2021 Admitting Physician Orma Flaming, MD DQQ:[id number], Beverly Gust, NP   Brief Narrative:  Patient is a 61 y.o. female with history of ESRD on HD TTS, HFrEF, SLE, anemia due to CKD, HTN-who presented with at least 1 week history of worsening weakness/lethargy, intermittent diarrhea, hematochezia-she had missed her last 3 HD sessions prior to hospitalization due to weakness/lethargy.  She was found to have COVID-19 infection-and thought to have uremic encephalopathy in the setting of missed HD.  She stabilized with Remdesivir and hemodialysis.  Unfortunately-Hospital course was complicated by severe arthralgias mostly in her left wrist/spine-low-grade fever-she was subsequently found to have streptococcal pneumonia bacteremia.   Significant Hospital events 1/06>> admit with diarrhea/intermittent hematochezia from COVID-uremic encephalopathy from missed HD. 1/10>> developed arthralgias-mostly in her left wrist.Prednisone ordered 1/11>> low-grade fever-worsening left wrist arthralgia-CT left wrist negative for effusion.   1/11>> blood culture positive Streptococcus pneumoniae-started on IV Rocephin. 1/19 >>Dr Apolonio Schneiders - Left wrist radiocarpal and midcarpal joint arthrotomy exploration and drainage  Subjective:   seen in HD Thought she was going to SNF   Objective: Vitals: Blood pressure (!) 135/59, pulse 67, temperature (!) 97.3 F (36.3 C), temperature source Oral, resp. rate 13, height 5\' 4"  (1.626 m), weight 50 kg, SpO2 97 %.   Exam:   General: Appearance:    Frail female in no acute distress     Lungs:     respirations unlabored  Heart:    Normal heart rate. Normal rhythm. No murmurs, rubs, or gallops.    MS:   All extremities are intact. Left wrist wrapped   Neurologic:   Awake, alert       Assessment/Plan:  COVID-19  infection: Improved-diarrhea has resolved --no PNA on CXR-completed 3 days of Remdesivir - repeat Covid test -ve.  Streptococcal pneumonia with bacteremia:  -Developed low-grade fever/left wrist arthralgia/back pain on 1/10-1/11 --blood cultures are positive for Streptococcus pneumonia.   -No PNA on CXR.   -TTE without vegetations.   -MRI with suspicious septic arthritis at L4-L5 level-Per IR-this is not amenable to drainage.   -TEE stable on 05/25/2021.   -Currently on IV cefazolin per ID with stop date of 07/01/2021.  Repeat blood cultures drawn on 05/21/2021 are negative so far.  Case discussed with neurosurgeon Dr. Kathyrn Sheriff including the MRI findings, does not think these findings are of any significance.  No further work-up for this issue of nonspecific MRI changes.   Left wrist septic arthritis:  Initially her exam was unremarkable and CT left wrist unremarkable, subsequently developed more swelling there after MRI was repeated suggesting septic arthritis -seen by orthopedic surgeon Dr. Apolonio Schneiders  -underwent Left wrist radiocarpal and midcarpal joint arthrotomy exploration and drainage on 05/27/2021.  Clinically improving now continue to monitor with IV antibiotics.   - continue IV Cefazolin tentative stop date 07/01/21.  Appreciate orthopedic surgery follow-up. Seen by ID as well.  SLE: Claims to have a longstanding history of lupus-followed by rheumatology at [facility].  Per patient-her Plaquenil was discontinued by her nephrologist several months ago - currently on oral prednisone 15 mg daily, plan is post discharge follow-up with her rheumatologist at [facility] for taper  ESRD:  -HD: 8/92  Chronic systolic heart  failure (EF 45-50% on 03/13/2021):  -Volume status is stable- fluid removed with HD  Diarrhea with intermittent hematochezia: Diarrhea due to COVID-19 infection-suspect hematochezia was in the setting of some mucosal sloughing.  Hematochezia and diarrhea  have resolved.  Given significant debility/deconditioning/stable hemoglobin-suspect we can postpone endoscopic evaluation to the outpatient setting.   -GI referral   Acute metabolic encephalopathy: Resolved.  Due to uremia in the setting of missed HD with some contribution from COVID-19.    HTN: BP high on Norvasc have added Coreg and as needed hydralazine.  Prolonged QTC: Appears to have develops some amount of QTC elongation over the past few months-repeat EKG on 1/11 and 1/13 with stable/normalized QTC.    We will attempt to keep K> 4, and Mg>2.  Monitor EKGs periodically-continue telemetry monitoring.    Tobacco abuse: Continue transdermal nicotine  LLL Nodule - outpt Pulm follow up, within a month of DC.    Deconditioning/debility: has progressed to be able to go home    Consults: ID, nephrology.  Neurosurgeon Dr. Kathyrn Sheriff on 05/24/2021 over the phone, orthopedics DVT Prophylaxis: SQ heparin Code Status:Full code    Disposition Plan: Status is: Inpatient  Home in the AM?      MEDICATIONS: Scheduled Meds:  amLODipine  10 mg Oral Daily   carvedilol  3.125 mg Oral BID WC   Chlorhexidine Gluconate Cloth  6 each Topical Q0600   cinacalcet  60 mg Oral Q T,Th,Sa-HD   doxercalciferol  6 mcg Intravenous Q T,Th,Sa-HD   famotidine  20 mg Oral Daily   feeding supplement (NEPRO CARB STEADY)  237 mL Oral BID BM   heparin injection (subcutaneous)  5,000 Units Subcutaneous Q8H   multivitamin  1 tablet Oral QHS   nicotine  14 mg Transdermal Daily   predniSONE  15 mg Oral Q breakfast   sodium chloride flush  3 mL Intravenous Q12H   Continuous Infusions:  sodium chloride      ceFAZolin (ANCEF) IV     PRN Meds:.sodium chloride, acetaminophen **OR** acetaminophen, hydrALAZINE, naLOXone (NARCAN)  injection, oxyCODONE, sodium chloride flush   I have personally reviewed following labs and imaging studies  LABORATORY DATA:  Recent Labs  Lab 05/28/21 0204 05/30/21 1641  06/01/21 0201 06/02/21 0128 06/03/21 0118  WBC 10.5 11.9* 10.7* 9.3 9.8  HGB 9.6* 9.5* 8.1* 8.1* 7.8*  HCT 30.8* 29.7* 25.6* 25.4* 25.9*  PLT 339 409* 343 335 338  MCV 90.1 90.5 92.4 91.7 93.2  MCH 28.1 29.0 29.2 29.2 28.1  MCHC 31.2 32.0 31.6 31.9 30.1  RDW 19.3* 19.5* 19.9* 19.9* 20.3*  LYMPHSABS 0.7  --  1.3 1.1 1.2  MONOABS 0.4  --  0.9 0.8 0.8  EOSABS 0.0  --  0.0 0.0 0.0  BASOSABS 0.0  --  0.0 0.0 0.0    Recent Labs  Lab 05/28/21 0427 05/30/21 1827 06/01/21 0201 06/02/21 0128 06/03/21 0118  NA 131* 134* 132* 133* 130*  K 5.3* 4.0 4.7 5.0 5.7*  CL 95* 96* 94* 95* 92*  CO2 25 27 25 26 22   GLUCOSE 104* 133* 104* 97 99  BUN 57* 21* 55* 74* 97*  CREATININE 6.93* 3.53* 7.16* 9.28* 10.66*  CALCIUM 8.2* 8.8* 8.5* 8.7* 8.8*  AST 16  --  17 14* 18  ALT 9  --  <5 <5 <5  ALKPHOS 64  --  54 56 53  BILITOT 0.2*  --  0.2* 0.4 0.4  ALBUMIN 1.9* 2.2* 2.1* 2.1* 2.2*  MG 2.5*  --   --   --   --  CRP 8.9*  --  2.5* 2.0* 1.8*  PROCALCITON 1.52  --  0.62 0.52 0.43    RADIOLOGY STUDIES/RESULTS: No results found.   LOS: 18 days   [place] DO on 06/03/2021 at 1:20 PM   -  To page go to [email or web address]

## 2021-06-03 NOTE — Progress Notes (Addendum)
Nephrology Follow-Up Consult note   Summary: Kathleen [name] is a/an 61 y.o. female with a past medical history significant for ESRD, admitted for COVID and was found to have strep pneumo bacteremia.     OP HD: [name]   3h 49min  50.8kg 350/1.5  2/2 bath F160  LUE AVG    - last post weight 1/3 was 48.2 kg  - sensipar 60 mg three times a week  - hectorol 6 mcg IV tiw  - venofer 100 mg weekly  - mircera 75 mcg q 2wks last 12/20, held 1/3 due to [place]   Assessment/Recommendations: ESRD: HD on TTS schedule. HD tomorrow.   Hyperkalemia: changed to renal diet, give lokelma 10 gm tonight.   Strep pneumo bacteremia: getting course of IV Ancef, thru 07/01/21.  Followed by ID.  TEE negative.  COVID-pneumonia: s/p treatment by primary team. Off precautions.   EDW/BP: minimal wt gains, close to dry wt here. No vol excess, possibly new lower dry wt around 48- 49kg  GI bleeding: intermittent hematochezia. Outpatient endoscopic eval. CTM per primary  Anemia of CKD:  no esa currently but will consider. No iron given infection  Secondary hyperparathyroidism: Continue Hectorol and Sensipar.  Phos has been low. Hold binder  SLE: on prednisone per primary  Wrist swelling: with concern of septic arthritis vs auto immune related. S/p washout. Cultures were negative.   Dispo: OK for discharge   [facility] 05/30/2021 3:26 PM  ___________________________________________________________  CC: ESRD, COVID, strep pneumo bacteremia  Interval History/Subjective: no new c/o's, walking in the halls w/ PT   Medications:  Current Facility-Administered Medications  Medication Dose Route Frequency Provider Last Rate Last Admin   0.9 %  sodium chloride infusion  250 mL Intravenous PRN [name], [name], MD       acetaminophen (TYLENOL) tablet 650 mg  650 mg Oral Q6H PRN Geralynn Rile, MD   650 mg at 05/20/21 9983   Or   acetaminophen (TYLENOL)  suppository 650 mg  650 mg Rectal Q6H PRN Geralynn Rile, MD       amLODipine (NORVASC) tablet 10 mg  10 mg Oral Daily [name], [name], MD   10 mg at 06/03/21 1126   carvedilol (COREG) tablet 3.125 mg  3.125 mg Oral BID WC Lala Lund K, MD   3.125 mg at 06/03/21 1126   ceFAZolin (ANCEF) IVPB 2g/100 mL premix  2 g Intravenous Q T,Th,Sa-HD Franky Macho, RPH       Chlorhexidine Gluconate Cloth 2 % PADS 6 each  6 each Topical Q0600 [name], [name], MD   6 each at 06/03/21 1131   cinacalcet (SENSIPAR) tablet 60 mg  60 mg Oral Q T,Th,Sa-HD Geralynn Rile, MD   60 mg at 05/30/21 1728   doxercalciferol (HECTOROL) injection 6 mcg  6 mcg Intravenous Q T,Th,Sa-HD Geralynn Rile, MD   6 mcg at 05/30/21 1720   famotidine (PEPCID) tablet 20 mg  20 mg Oral Daily Geralynn Rile, MD   20 mg at 06/03/21 1126   feeding supplement (NEPRO CARB STEADY) liquid 237 mL  237 mL Oral BID BM Thurnell Lose, MD   237 mL at 06/02/21 0929   heparin injection 5,000 Units  5,000 Units Subcutaneous Q8H Geralynn Rile, MD   5,000 Units at 06/03/21 1446   hydrALAZINE (APRESOLINE) injection 10 mg  10 mg Intravenous Q6H PRN Geralynn Rile, MD   10 mg at  05/25/21 1101   multivitamin (RENA-VIT) tablet 1 tablet  1 tablet Oral QHS Geralynn Rile, MD   1 tablet at 06/02/21 2110   naloxone [facility]) injection 0.4 mg  0.4 mg Intravenous PRN Geralynn Rile, MD       nicotine (NICODERM CQ - dosed in mg/24 hours) patch 14 mg  14 mg Transdermal Daily [name], [name], MD   14 mg at 06/03/21 1126   oxyCODONE (Oxy IR/ROXICODONE) immediate release tablet 10 mg  10 mg Oral Q4H PRN Geralynn Rile, MD   10 mg at 06/03/21 1447   predniSONE (DELTASONE) tablet 15 mg  15 mg Oral Q breakfast Thurnell Lose, MD   15 mg at 06/03/21 1126   sodium chloride flush (NS) 0.9 % injection 3 mL  3 mL Intravenous Q12H Geralynn Rile, MD   3 mL at 06/03/21 1131   sodium  chloride flush (NS) 0.9 % injection 3 mL  3 mL Intravenous PRN Geralynn Rile, MD          Review of Systems: 10 systems reviewed and negative except per interval history/subjective  Physical Exam: Vitals:   06/03/21 1014 06/03/21 1020  BP: 138/69 (!) 135/59  Pulse: 65 67  Resp:  13  Temp:  (!) 97.3 F (36.3 C)  SpO2:  97%   Total I/O In: -  Out: 2500 [Other:2500]  Intake/Output Summary (Last 24 hours) at 06/03/2021 1526 Last data filed at 06/03/2021 1020 Gross per 24 hour  Intake 350 ml  Output 2500 ml  Net -2150 ml    Constitutional: well-appearing, tired but no distress ENMT: ears and nose without scars or lesions, MMM CV: normal rate, left wrist covered in bandage Respiratory: bilateral chest rise, normal work of breathing Gastrointestinal: soft, no distension Skin: no visible lesions or rashes Psych: alert, appropriate mood and affect   Test Results I personally reviewed new and old clinical labs and radiology tests Lab Results  Component Value Date   NA 130 (L) 06/03/2021   K 5.7 (H) 06/03/2021   CL 92 (L) 06/03/2021   CO2 22 06/03/2021   BUN 97 (H) 06/03/2021   CREATININE 10.66 (H) 06/03/2021   CALCIUM 8.8 (L) 06/03/2021   ALBUMIN 2.2 (L) 06/03/2021   PHOS 3.4 05/30/2021

## 2021-06-04 ENCOUNTER — Other Ambulatory Visit (HOSPITAL_COMMUNITY): Payer: Self-pay

## 2021-06-04 ENCOUNTER — Inpatient Hospital Stay: Payer: Medicaid Other | Admitting: Internal Medicine

## 2021-06-04 LAB — CBC WITH DIFFERENTIAL/PLATELET
Abs Immature Granulocytes: 0.08 10*3/uL — ABNORMAL HIGH (ref 0.00–0.07)
Basophils Absolute: 0 10*3/uL (ref 0.0–0.1)
Basophils Relative: 0 %
Eosinophils Absolute: 0 10*3/uL (ref 0.0–0.5)
Eosinophils Relative: 0 %
HCT: 26.4 % — ABNORMAL LOW (ref 36.0–46.0)
Hemoglobin: 8.3 g/dL — ABNORMAL LOW (ref 12.0–15.0)
Immature Granulocytes: 1 %
Lymphocytes Relative: 13 %
Lymphs Abs: 1.2 10*3/uL (ref 0.7–4.0)
MCH: 29 pg (ref 26.0–34.0)
MCHC: 31.4 g/dL (ref 30.0–36.0)
MCV: 92.3 fL (ref 80.0–100.0)
Monocytes Absolute: 0.9 10*3/uL (ref 0.1–1.0)
Monocytes Relative: 10 %
Neutro Abs: 6.7 10*3/uL (ref 1.7–7.7)
Neutrophils Relative %: 76 %
Platelets: 321 10*3/uL (ref 150–400)
RBC: 2.86 MIL/uL — ABNORMAL LOW (ref 3.87–5.11)
RDW: 20.2 % — ABNORMAL HIGH (ref 11.5–15.5)
WBC: 8.9 10*3/uL (ref 4.0–10.5)
nRBC: 0 % (ref 0.0–0.2)

## 2021-06-04 LAB — COMPREHENSIVE METABOLIC PANEL
ALT: <5 U/L (ref 0–44)
AST: 19 U/L (ref 15–41)
Albumin: 2.2 g/dL — ABNORMAL LOW (ref 3.5–5.0)
Alkaline Phosphatase: 59 U/L (ref 38–126)
Anion gap: 13 (ref 5–15)
BUN: 48 mg/dL — ABNORMAL HIGH (ref 6–20)
CO2: 27 mmol/L (ref 22–32)
Calcium: 8.5 mg/dL — ABNORMAL LOW (ref 8.9–10.3)
Chloride: 93 mmol/L — ABNORMAL LOW (ref 98–111)
Creatinine, Ser: 6.62 mg/dL — ABNORMAL HIGH (ref 0.44–1.00)
GFR, Estimated: 7 mL/min — ABNORMAL LOW (ref >60)
Glucose, Bld: 95 mg/dL (ref 70–99)
Potassium: 4.9 mmol/L (ref 3.5–5.1)
Sodium: 133 mmol/L — ABNORMAL LOW (ref 135–145)
Total Bilirubin: 0.3 mg/dL (ref 0.3–1.2)
Total Protein: 6.3 g/dL — ABNORMAL LOW (ref 6.5–8.1)

## 2021-06-04 LAB — PROCALCITONIN: Procalcitonin: 0.44 ng/mL

## 2021-06-04 LAB — C-REACTIVE PROTEIN: CRP: 2.7 mg/dL — ABNORMAL HIGH (ref <1.0)

## 2021-06-04 MED ORDER — OXYCODONE HCL 10 MG PO TABS
10.0000 mg | ORAL_TABLET | Freq: Four times a day (QID) | ORAL | 0 refills | Status: DC | PRN
  Filled 2021-06-04: qty 20, 5d supply, fill #0

## 2021-06-04 MED ORDER — CEFAZOLIN SODIUM-DEXTROSE 2-4 GM/100ML-% IV SOLN: 2.0000 g | INTRAVENOUS | Status: DC

## 2021-06-04 MED ORDER — CARVEDILOL 3.125 MG PO TABS
3.1250 mg | ORAL_TABLET | Freq: Two times a day (BID) | ORAL | 0 refills | Status: DC
  Filled 2021-06-04: qty 60, 30d supply, fill #0

## 2021-06-04 MED ORDER — FAMOTIDINE 10 MG PO TABS: 10.0000 mg | ORAL_TABLET | Freq: Every day | ORAL | Status: DC

## 2021-06-04 MED ORDER — AMLODIPINE BESYLATE 10 MG PO TABS
10.0000 mg | ORAL_TABLET | Freq: Every day | ORAL | 0 refills | Status: DC
  Filled 2021-06-04: qty 30, 30d supply, fill #0

## 2021-06-04 MED ORDER — PREDNISONE 5 MG PO TABS
15.0000 mg | ORAL_TABLET | Freq: Every day | ORAL | 0 refills | Status: DC
  Filled 2021-06-04: qty 90, 30d supply, fill #0

## 2021-06-04 NOTE — Progress Notes (Signed)
Nephrology Follow-Up Consult note   Summary: Kathleen [name] is a/an 61 y.o. female with a past medical history significant for ESRD, admitted for COVID and was found to have strep pneumo bacteremia.     [facility] HD: [name]   3h 93min  50.8kg 350/1.5  2/2 bath F160  LUE AVG    - last post weight 1/3 was 48.2 kg  - sensipar 60 mg three times a week  - hectorol 6 mcg IV tiw  - venofer 100 mg weekly  - mircera 75 mcg q 2wks last 12/20, held 1/3 due to [place]   Assessment/Recommendations: ESRD: HD on TTS schedule. HD today.   Hyperkalemia: resolved w/ renal diet  Strep pneumo bacteremia: getting course of IV Ancef, thru 07/01/21.  TEE negative.  COVID-pneumonia: s/p treatment by primary team. Off precautions.   EDW/BP: minimal wt gains, close to dry wt here. No vol excess, possibly lower to 49kg  GI bleeding: intermittent hematochezia. Outpatient endoscopic eval. CTM per primary  Anemia of CKD:  no esa currently but will consider. No iron given infection  Secondary hyperparathyroidism: Continue Hectorol and Sensipar.  Phos has been low. Hold binder  SLE: on prednisone per primary  Wrist swelling: with concern of septic arthritis vs auto immune related. S/p washout. Cultures were negative.   Dispo: going home today   [facility] 05/30/2021 12:16 PM  ___________________________________________________________  CC: ESRD, COVID, strep pneumo bacteremia  Interval History/Subjective: no new c/o's   Medications:  Current Facility-Administered Medications  Medication Dose Route Frequency Provider Last Rate Last Admin   0.9 %  sodium chloride infusion  250 mL Intravenous PRN [name], [name], MD       acetaminophen (TYLENOL) tablet 650 mg  650 mg Oral Q6H PRN Geralynn Rile, MD   650 mg at 05/20/21 5361   Or   acetaminophen (TYLENOL) suppository 650 mg  650 mg Rectal Q6H PRN Geralynn Rile, MD       amLODipine (NORVASC) tablet  10 mg  10 mg Oral Daily Geralynn Rile, MD   10 mg at 06/03/21 1126   carvedilol (COREG) tablet 3.125 mg  3.125 mg Oral BID WC Lala Lund K, MD   3.125 mg at 06/03/21 1126   ceFAZolin (ANCEF) IVPB 2g/100 mL premix  2 g Intravenous Q T,Th,Sa-HD Franky Macho, [place] at 06/04/21 1215   Chlorhexidine Gluconate Cloth 2 % PADS 6 each  6 each Topical Q0600 Geralynn Rile, MD   6 each at 06/04/21 1134   cinacalcet (SENSIPAR) tablet 60 mg  60 mg Oral Q T,Th,Sa-HD Geralynn Rile, MD   60 mg at 06/04/21 1014   doxercalciferol (HECTOROL) injection 6 mcg  6 mcg Intravenous Q T,Th,Sa-HD Geralynn Rile, MD   6 mcg at 06/04/21 1015   famotidine (PEPCID) tablet 20 mg  20 mg Oral Daily Geralynn Rile, MD   20 mg at 06/03/21 1126   feeding supplement (NEPRO CARB STEADY) liquid 237 mL  237 mL Oral BID BM Thurnell Lose, MD   237 mL at 06/02/21 0929   heparin injection 5,000 Units  5,000 Units Subcutaneous Q8H Geralynn Rile, MD   5,000 Units at 06/04/21 0540   hydrALAZINE (APRESOLINE) injection 10 mg  10 mg Intravenous Q6H PRN Geralynn Rile, MD   10 mg at 05/25/21 1101   multivitamin (RENA-VIT) tablet 1 tablet  1 tablet Oral QHS Geralynn Rile, MD  1 tablet at 06/03/21 2213   naloxone [facility]) injection 0.4 mg  0.4 mg Intravenous PRN Geralynn Rile, MD       nicotine (NICODERM CQ - dosed in mg/24 hours) patch 14 mg  14 mg Transdermal Daily [name], [name], MD   14 mg at 06/03/21 1126   oxyCODONE (Oxy IR/ROXICODONE) immediate release tablet 10 mg  10 mg Oral Q4H PRN Geralynn Rile, MD   10 mg at 06/03/21 1447   predniSONE (DELTASONE) tablet 15 mg  15 mg Oral Q breakfast Thurnell Lose, MD   15 mg at 06/03/21 1126   sodium chloride flush (NS) 0.9 % injection 3 mL  3 mL Intravenous Q12H Geralynn Rile, MD   3 mL at 06/03/21 1131   sodium chloride flush (NS) 0.9 % injection 3 mL  3 mL Intravenous PRN Geralynn Rile, MD          Review of Systems: 10 systems reviewed and negative except per interval history/subjective  Physical Exam: Vitals:   06/04/21 1030 06/04/21 1059  BP: 117/67 120/65  Pulse: 75 75  Resp: 12 13  Temp:    SpO2:     Total I/O In: -  Out: 3000 [Other:3000]  Intake/Output Summary (Last 24 hours) at 06/04/2021 1216 Last data filed at 06/04/2021 1059 Gross per 24 hour  Intake --  Output 3000 ml  Net -3000 ml    Constitutional: well-appearing, tired but no distress ENMT: ears and nose without scars or lesions, MMM CV: normal rate, left wrist covered in bandage Respiratory: bilateral chest rise, normal work of breathing Gastrointestinal: soft, no distension Skin: no visible lesions or rashes Psych: alert, appropriate mood and affect   Test Results I personally reviewed new and old clinical labs and radiology tests Lab Results  Component Value Date   NA 133 (L) 06/04/2021   K 4.9 06/04/2021   CL 93 (L) 06/04/2021   CO2 27 06/04/2021   BUN 48 (H) 06/04/2021   CREATININE 6.62 (H) 06/04/2021   CALCIUM 8.5 (L) 06/04/2021   ALBUMIN 2.2 (L) 06/04/2021   PHOS 3.4 05/30/2021

## 2021-06-04 NOTE — Discharge Summary (Signed)
Physician Discharge Summary  Kathleen [name] IEP:[id number] DOB: 24-Jul-1960 DOA: 05/15/2021  PCP: Angela Burke, NP  Admit date: 05/15/2021 Discharge date: 06/04/2021  Admitted From: home Discharge disposition: home   Recommendations for Outpatient Follow-Up:   Outpatient orthopedics follow up for wrist Needs to be seen by rheumatology and have steroid taper done Referral to GI for endoscopy studies IV abx with HD through 2/23 Outpatient pulm follow up for lung nodules   Discharge Diagnosis:   Principal Problem:   FTT (failure to thrive) in adult Active Problems:   Rectal bleeding   Lupus ([place])   ESRD (end stage renal disease) ([place])   Anemia in ESRD (end-stage renal disease) ([place])   Prolonged QT interval   Chronic combined systolic and diastolic CHF (congestive heart failure) ([place])   Hypertension   Tobacco dependence   Uremia    Discharge Condition: Improved.  Diet recommendation: renal  Code status: Full.   History of Present Illness:   Patient is a 61 y.o. female with history of ESRD on HD TTS, HFrEF, SLE, anemia due to CKD, HTN-who presented with at least 1 week history of worsening weakness/lethargy, intermittent diarrhea, hematochezia-she had missed her last 3 HD sessions prior to hospitalization due to weakness/lethargy.  She was found to have COVID-19 infection-and thought to have uremic encephalopathy in the setting of missed HD.  She stabilized with Remdesivir and hemodialysis.  Unfortunately-Hospital course was complicated by severe arthralgias mostly in her left wrist/spine-low-grade fever-she was subsequently found to have streptococcal pneumonia bacteremia.   1/06>> admit with diarrhea/intermittent hematochezia from COVID-uremic encephalopathy from missed HD. 1/10>> developed arthralgias-mostly in her left wrist.Prednisone ordered 1/11>> low-grade fever-worsening left wrist arthralgia-CT left wrist negative for effusion.   1/11>> blood culture  positive Streptococcus pneumoniae-started on IV Rocephin. 1/19 >>Dr Apolonio Schneiders - Left wrist radiocarpal and midcarpal joint arthrotomy exploration and drainage  Hospital Course by Problem:   COVID-19 infection: Improved-diarrhea has resolved --no PNA on CXR-completed 3 days of Remdesivir - repeat Covid test -ve.   Streptococcal pneumonia with bacteremia:  -Developed low-grade fever/left wrist arthralgia/back pain on 1/10-1/11 --blood cultures are positive for Streptococcus pneumonia.   -No PNA on CXR.   -TTE without vegetations.   -MRI with suspicious septic arthritis at L4-L5 level-Per IR-this is not amenable to drainage.   -TEE stable on 05/25/2021.   -Currently on IV cefazolin per ID with stop date of 07/01/2021.  Repeat blood cultures drawn on 05/21/2021 are negative so far.  Case discussed with neurosurgeon Dr. Kathyrn Sheriff including the MRI findings, does not think these findings are of any significance.  No further work-up for this issue of nonspecific MRI changes.    Left wrist septic arthritis:  Initially her exam was unremarkable and CT left wrist unremarkable, subsequently developed more swelling there after MRI was repeated suggesting septic arthritis -seen by orthopedic surgeon Dr. Apolonio Schneiders  -underwent Left wrist radiocarpal and midcarpal joint arthrotomy exploration and drainage on 05/27/2021.  Clinically improving now continue to monitor with IV antibiotics.   - continue IV Cefazolin tentative stop date 07/01/21.  Appreciate orthopedic surgery follow-up. Seen by ID as well.   SLE: Claims to have a longstanding history of lupus-followed by rheumatology at [facility].  Per patient-her Plaquenil was discontinued by her nephrologist several months ago - currently on oral prednisone 15 mg daily, plan is post discharge follow-up with her rheumatologist at [facility] for taper/change in meds -acid blocker while on steroids   ESRD:  -  HD: 1/26 T/th/sat   Chronic systolic  heart failure (EF 45-50% on 03/13/2021):  -Volume status is stable- fluid removed with HD   Diarrhea with intermittent hematochezia: Diarrhea due to COVID-19 infection-suspect hematochezia was in the setting of some mucosal sloughing.  Hematochezia and diarrhea have resolved.  Given significant debility/deconditioning/stable hemoglobin-suspect we can postpone endoscopic evaluation to the outpatient setting.   -GI referral per PCP   Acute metabolic encephalopathy: Resolved.  Due to uremia in the setting of missed HD with some contribution from COVID-19.     HTN: BP high on Norvasc have added Coreg with improvement   Prolonged QTC: Appears to have develops some amount of QTC elongation over the past few months-repeat EKG on 1/11 and 1/13 with stable/normalized QTC.    We will attempt to keep K> 4, and Mg>2.  Monitor EKGs periodically  Tobacco abuse:  -continue to encourage cessation   LLL Nodule - outpt Pulm follow up       Medical Consultants:   Ortho ID renal    Discharge Exam:   Vitals:   06/04/21 1030 06/04/21 1059  BP: 117/67 120/65  Pulse: 75 75  Resp: 12 13  Temp:    SpO2:     Vitals:   06/04/21 0930 06/04/21 1000 06/04/21 1030 06/04/21 1059  BP: 135/73 120/64 117/67 120/65  Pulse: 74 71 75 75  Resp: 15 16 12 13   Temp:      TempSrc:      SpO2:      Weight:      Height:        General exam: Appears calm and comfortable.   The results of significant diagnostics from this hospitalization (including imaging, microbiology, ancillary and laboratory) are listed below for reference.     Procedures and Diagnostic Studies:   CT ABDOMEN PELVIS WO CONTRAST  Result Date: 05/15/2021 CLINICAL DATA:  Abdominal pain EXAM: CT ABDOMEN AND PELVIS WITHOUT CONTRAST TECHNIQUE: Multidetector CT imaging of the abdomen and pelvis was performed following the standard protocol without IV contrast. COMPARISON:  CT abdomen and pelvis 03/12/2018 FINDINGS: Lower chest: Hyperinflated  lungs with minor dependent subsegmental atelectatic changes. Hepatobiliary: Liver is normal in size and contour with no suspicious mass appreciated. Calcified gallstones visualized measuring up to 12 mm in size. No definite gallbladder wall thickening or pericholecystic edema appreciated. No biliary ductal dilatation identified. Pancreas: Unremarkable. No pancreatic ductal dilatation or surrounding inflammatory changes. Spleen: Normal in size without focal abnormality. Adrenals/Urinary Tract: No adrenal mass visualized. No nephrolithiasis or hydronephrosis identified bilaterally. Urinary bladder is nondistended and not well evaluated. Stomach/Bowel: No bowel obstruction, free air or pneumatosis. No definite evidence of bowel edema. No evidence of acute appendicitis in the right lower quadrant. Vascular/Lymphatic: Severe atherosclerotic disease. No bulky lymphadenopathy identified. Reproductive: Uterus and bilateral adnexa are unremarkable. Other: No ascites. Musculoskeletal: No suspicious bony lesions identified. IMPRESSION: 1. No acute process identified. 2. Cholelithiasis. Electronically Signed   By: Ofilia Neas M.D.   On: 05/15/2021 16:10   DG Chest 2 View  Result Date: 05/15/2021 CLINICAL DATA:  Shortness of breath EXAM: CHEST - 2 VIEW COMPARISON:  Chest radiograph dated March 15, 2021 FINDINGS: The heart size and mediastinal contours are within normal limits. Aorta is tortuous with atherosclerotic calcifications. Both lungs are clear. The visualized skeletal structures are unremarkable. IMPRESSION: No active cardiopulmonary disease. Electronically Signed   By: Keane Police D.O.   On: 05/15/2021 16:19     Labs:   Basic Metabolic Panel: Recent Labs  Lab 05/29/21 1035 05/30/21 1827 05/30/21 1827 06/01/21 0201 06/02/21 0128 06/03/21 0118 06/04/21 0103  NA  --  134*  --  132* 133* 130* 133*  K  --  4.0   < > 4.7 5.0 5.7* 4.9  CL  --  96*  --  94* 95* 92* 93*  CO2  --  27  --  25 26 22  27   GLUCOSE  --  133*  --  104* 97 99 95  BUN  --  21*  --  55* 74* 97* 48*  CREATININE  --  3.53*  --  7.16* 9.28* 10.66* 6.62*  CALCIUM  --  8.8*  --  8.5* 8.7* 8.8* 8.5*  PHOS 3.2 3.4  --   --   --   --   --    < > = values in this interval not displayed.   GFR Estimated Creatinine Clearance: 7.1 mL/min (A) (by C-G formula based on SCr of 6.62 mg/dL (H)). Liver Function Tests: Recent Labs  Lab 05/30/21 1827 06/01/21 0201 06/02/21 0128 06/03/21 0118 06/04/21 0103  AST  --  17 14* 18 19  ALT  --  <5 <5 <5 <5  ALKPHOS  --  54 56 53 59  BILITOT  --  0.2* 0.4 0.4 0.3  PROT  --  6.4* 6.1* 6.0* 6.3*  ALBUMIN 2.2* 2.1* 2.1* 2.2* 2.2*   No results for input(s): LIPASE, AMYLASE in the last 168 hours. No results for input(s): AMMONIA in the last 168 hours. Coagulation profile No results for input(s): INR, PROTIME in the last 168 hours.  CBC: Recent Labs  Lab 05/30/21 1641 06/01/21 0201 06/02/21 0128 06/03/21 0118 06/04/21 0103  WBC 11.9* 10.7* 9.3 9.8 8.9  NEUTROABS  --  8.3* 7.4 7.7 6.7  HGB 9.5* 8.1* 8.1* 7.8* 8.3*  HCT 29.7* 25.6* 25.4* 25.9* 26.4*  MCV 90.5 92.4 91.7 93.2 92.3  PLT 409* 343 335 338 321   Cardiac Enzymes: No results for input(s): CKTOTAL, CKMB, CKMBINDEX, TROPONINI in the last 168 hours. BNP: Invalid input(s): POCBNP CBG: No results for input(s): GLUCAP in the last 168 hours. D-Dimer No results for input(s): DDIMER in the last 72 hours. Hgb A1c No results for input(s): HGBA1C in the last 72 hours. Lipid Profile No results for input(s): CHOL, HDL, LDLCALC, TRIG, CHOLHDL, LDLDIRECT in the last 72 hours. Thyroid function studies No results for input(s): TSH, T4TOTAL, T3FREE, THYROIDAB in the last 72 hours.  Invalid input(s): FREET3 Anemia work up No results for input(s): VITAMINB12, FOLATE, FERRITIN, TIBC, IRON, RETICCTPCT in the last 72 hours. Microbiology Recent Results (from the past 240 hour(s))  Aerobic/Anaerobic Culture w Gram Stain  (surgical/deep wound)     Status: None   Collection Time: 05/27/21  7:06 PM   Specimen: PATH Soft tissue  Result Value Ref Range Status   Specimen Description TISSUE WRIST LEFT SYNOVIAL  Final   Special Requests IN A CUP  Final   Gram Stain NO WBC SEEN NO ORGANISMS SEEN   Final   Culture   Final    RARE PROPIONIBACTERIUM ACNES Standardized susceptibility testing for this organism is not available. Performed at [facility], [place] [address], [place], [place] [address]    Report Status 06/01/2021 FINAL  Final  Aerobic/Anaerobic Culture w Gram Stain (surgical/deep wound)     Status: None   Collection Time: 05/27/21  7:06 PM   Specimen: WRIST  Result Value Ref Range Status   Specimen Description WRIST  LEFT SYNOVIAL  Final   Special Requests SWABS  Final   Gram Stain   Final    FEW WBC PRESENT, PREDOMINANTLY MONONUCLEAR NO ORGANISMS SEEN    Culture   Final    No growth aerobically or anaerobically. Performed at [facility], [place] [address], [place], [place] [address]    Report Status 06/01/2021 FINAL  Final  Resp Panel by RT-PCR (Flu A&B, Covid) Nasopharyngeal Swab     Status: None   Collection Time: 05/30/21  8:22 AM   Specimen: Nasopharyngeal Swab; Nasopharyngeal(NP) swabs in vial transport medium  Result Value Ref Range Status   SARS Coronavirus 2 by RT PCR NEGATIVE NEGATIVE Final    Comment: (NOTE) SARS-CoV-2 target nucleic acids are NOT DETECTED.  The SARS-CoV-2 RNA is generally detectable in upper respiratory specimens during the acute phase of infection. The lowest concentration of SARS-CoV-2 viral copies this assay can detect is 138 copies/mL. A negative result does not preclude SARS-Cov-2 infection and should not be used as the sole basis for treatment or other patient management decisions. A negative result may occur with  improper specimen collection/handling, submission of specimen other than nasopharyngeal swab, presence of viral mutation(s) within  the areas targeted by this assay, and inadequate number of viral copies(<138 copies/mL). A negative result must be combined with clinical observations, patient history, and epidemiological information. The expected result is Negative.  Fact Sheet for Patients:  EntrepreneurPulse.com.au  Fact Sheet for Healthcare Providers:  IncredibleEmployment.be  This test is no t yet approved or cleared by the [place] FDA and  has been authorized for detection and/or diagnosis of SARS-CoV-2 by FDA under an Emergency Use Authorization (EUA). This EUA will remain  in effect (meaning this test can be used) for the duration of the COVID-19 declaration under Section 564(b)(1) of the Act, 21 U.S.C.section 360bbb-3(b)(1), unless the authorization is terminated  or revoked sooner.       Influenza A by PCR NEGATIVE NEGATIVE Final   Influenza B by PCR NEGATIVE NEGATIVE Final    Comment: (NOTE) The Xpert Xpress SARS-CoV-2/FLU/RSV plus assay is intended as an aid in the diagnosis of influenza from Nasopharyngeal swab specimens and should not be used as a sole basis for treatment. Nasal washings and aspirates are unacceptable for Xpert Xpress SARS-CoV-2/FLU/RSV testing.  Fact Sheet for Patients: EntrepreneurPulse.com.au  Fact Sheet for Healthcare Providers: IncredibleEmployment.be  This test is not yet approved or cleared by the [place] FDA and has been authorized for detection and/or diagnosis of SARS-CoV-2 by FDA under an Emergency Use Authorization (EUA). This EUA will remain in effect (meaning this test can be used) for the duration of the COVID-19 declaration under Section 564(b)(1) of the Act, 21 U.S.C. section 360bbb-3(b)(1), unless the authorization is terminated or revoked.  Performed at [facility], [place] [address], [place], [place] [address]      Discharge Instructions:   Discharge  Instructions     Discharge instructions   Complete by: As directed    Renal diet  getting course of IV Ancef, thru 07/01/21 Need to see [facility]ology at [facility] - taper steroids there   Discharge wound care:   Complete by: As directed    Continue to keep the splint in place and keep it clean and dry.   Increase activity slowly   Complete by: As directed       Allergies as of 06/04/2021   No Known Allergies      Medication List  STOP taking these medications    sevelamer carbonate 800 MG tablet Commonly known as: RENVELA       TAKE these medications    acetaminophen 325 MG tablet Commonly known as: TYLENOL Take 650 mg by mouth every 6 (six) hours as needed for pain.   amLODipine 10 MG tablet Commonly known as: NORVASC Take 1 tablet (10 mg total) by mouth daily. What changed:  medication strength how much to take when to take this   carvedilol 3.125 MG tablet Commonly known as: COREG Take 1 tablet (3.125 mg total) by mouth 2 (two) times daily with a meal.   ceFAZolin 2-4 GM/100ML-% IVPB Commonly known as: ANCEF Inject 100 mLs (2 g total) into the vein Every Tuesday,Thursday,and Saturday with dialysis. Start taking on: June 06, 2021   famotidine 10 MG tablet Commonly known as: PEPCID Take 1 tablet (10 mg total) by mouth daily.   Oxycodone HCl 10 MG Tabs Take 1 tablet (10 mg total) by mouth every 6 (six) hours as needed for moderate pain.   predniSONE 5 MG tablet Commonly known as: DELTASONE Take 3 tablets (15 mg total) by mouth daily with breakfast.               Discharge Care Instructions  (From admission, onward)           Start     Ordered   06/04/21 0000  Discharge wound care:       Comments: Continue to keep the splint in place and keep it clean and dry.   06/04/21 1231            Follow-up Information     Icard, Bradley L, DO. Schedule an appointment as soon as possible for a visit in 1 week(s).   Specialty: Pulmonary  Disease Why: lung nodule Contact information: [address] [place] [address] [phone]         Angela Burke, NP. Schedule an appointment as soon as possible for a visit in 1 week(s).   Specialty: Internal Medicine Why: and your Rheumatologist in 1 week Contact information: [address] [place] [address] [phone]         Iran Planas, MD. Schedule an appointment as soon as possible for a visit.   Specialty: Orthopedic Surgery Contact information: [address] [place] [address] [phone]                  Time coordinating discharge: 35 min  Signed:  Geradine Girt DO  [facility] 06/04/2021, 12:31 PM

## 2021-06-04 NOTE — Progress Notes (Signed)
Patient arrived back from dialysis alert and oriented x4. Bed in lowest position call light in reach. Patient currently eating

## 2021-06-04 NOTE — Progress Notes (Signed)
Occupational Therapy Treatment Patient Details Name: Kathleen [name] MRN: [id number] DOB: 1960-11-17 Today's Date: 06/04/2021   History of present illness Pt is a 61 y.o. female who presented 05/15/21 with weakness. Pt with GI bleed. work up underway, but current diagnosis:  FTT, chronic combined systolic/diastolic HF, COVID -2  PMH: ESRD on HD, COPD, HTN, migraine, lupus, seziures, anemia, cardiac arrest hx, cellulitis, cerebral vasculitis, hx of suicide attempt, TB, TIA   OT comments  Pt with improved cognition and steadiness with OOB activity. Supervised for safety during ADLs, post HD earlier today. No c/o pain in L UE this visit, but tends to guard it and keep elbow flexed when ambulating.    Recommendations for follow up therapy are one component of a multi-disciplinary discharge planning process, led by the attending physician.  Recommendations may be updated based on patient status, additional functional criteria and insurance authorization.    Follow Up Recommendations  Skilled nursing-short term rehab (<3 hours/day)    Assistance Recommended at Discharge Frequent or constant Supervision/Assistance  Patient can return home with the following  A little help with walking and/or transfers;A little help with bathing/dressing/bathroom   Equipment Recommendations  None recommended by OT    Recommendations for Other Services      Precautions / Restrictions Precautions Precautions: None Required Braces or Orthoses: Splint/Cast Splint/Cast: L wrist splinted and ace wrapped       Mobility Bed Mobility Overal bed mobility: Modified Independent             General bed mobility comments: HOB up    Transfers Overall transfer level: Independent Equipment used: None   Sit to Stand: Modified independent (Device/Increase time)                 Balance     Sitting balance-Leahy Scale: Good     Standing balance support: No upper extremity supported Standing  balance-Leahy Scale: Fair                             ADL either performed or assessed with clinical judgement   ADL Overall ADL's : Needs assistance/impaired     Grooming: Oral care;Wash/dry hands;Standing;Supervision/safety           Upper Body Dressing : Set up;Sitting   Lower Body Dressing: Set up;Sitting/lateral leans   Toilet Transfer: Supervision/safety;Ambulation;Comfort height toilet   Toileting- Clothing Manipulation and Hygiene: Supervision/safety;Sit to/from stand       Functional mobility during ADLs: Supervision/safety      Extremity/Trunk Assessment              Vision       Perception     Praxis      Cognition Arousal/Alertness: Awake/alert Behavior During Therapy: WFL for tasks assessed/performed Overall Cognitive Status: Within Functional Limits for tasks assessed                                          Exercises      Shoulder Instructions       General Comments      Pertinent Vitals/ Pain       Pain Assessment Pain Assessment: No/denies pain  Home Living  Prior Functioning/Environment              Frequency  Min 2X/week        Progress Toward Goals  OT Goals(current goals can now be found in the care plan section)  Progress towards OT goals: Progressing toward goals  Acute Rehab OT Goals OT Goal Formulation: With patient Time For Goal Achievement: 06/12/21 Potential to Achieve Goals: Good  Plan Discharge plan remains appropriate    Co-evaluation                 AM-PAC OT "6 Clicks" Daily Activity     Outcome Measure   Help from another person eating meals?: A Little Help from another person taking care of personal grooming?: A Little Help from another person toileting, which includes using toliet, bedpan, or urinal?: A Little Help from another person bathing (including washing, rinsing, drying)?: A  Little Help from another person to put on and taking off regular upper body clothing?: A Little Help from another person to put on and taking off regular lower body clothing?: None 6 Click Score: 19    End of Session    OT Visit Diagnosis: Unsteadiness on feet (R26.81)   Activity Tolerance Patient tolerated treatment well   Patient Left in bed;with call bell/phone within reach;with bed alarm set   Nurse Communication          Time: 1316-1330 OT Time Calculation (min): 14 min  Charges: OT General Charges $OT Visit: 1 Visit OT Treatments $Self Care/Home Management : 8-22 mins  Nestor Lewandowsky, OTR/L Acute Rehabilitation Services Pager: [phone] Office: [phone]   [name] 06/04/2021, 2:10 PM

## 2021-06-04 NOTE — TOC Transition Note (Signed)
Transition of Care [facility]) - CM/SW Discharge Note   Patient Details  Name: Kathleen [name] MRN: [id number] Date of Birth: 1961/03/26  [facility] CM/SW Contact:  Emeterio Reeve, LCSW Phone Number: 06/04/2021, 3:37 PM   Clinical Narrative:     Pt will discharge to her home address. Medicaid transportation will begin pick up on 06/06/21. Pt is aware that she needs to be dressed and ready an hour before her appointment and answer her phone when they call. Pt states she has transportation home. CSW gave Nurse taxi voucher as a backup.   Final next level of care: Home/Self Care Barriers to Discharge: Barriers Resolved   Patient Goals and CMS Choice Patient states their goals for this hospitalization and ongoing recovery are:: to get better CMS Medicare.gov Compare Post Acute Care list provided to:: Patient Choice offered to / list presented to : Patient  Discharge Placement              Patient chooses bed at:  [facility]) Patient to be transferred to facility by: Taxi Name of family member notified: none Patient and family notified of of transfer: 06/04/21  Discharge Plan and Services                                     Social Determinants of Health (SDOH) Interventions     Readmission Risk Interventions No flowsheet data found.   Emeterio Reeve, LCSW Clinical Social Worker

## 2021-06-04 NOTE — Care Management Important Message (Signed)
Important Message  Patient Details  Name: Kathleen [name] MRN: [id number] Date of Birth: 1960-05-22   Medicare Important Message Given:  Yes     Hannah Beat 06/04/2021, 2:45 PM

## 2021-06-04 NOTE — Progress Notes (Signed)
Patient discharged to discharge lounge with all belongings.

## 2021-06-04 NOTE — Progress Notes (Signed)
OT Cancellation Note  Patient Details Name: Kathleen [name] MRN: [id number] DOB: 1960/06/08   Cancelled Treatment:    Reason Eval/Treat Not Completed: Patient at procedure or test/ unavailable (HD)  [name] 06/04/2021, 8:39 AM Nestor Lewandowsky, OTR/L Acute Rehabilitation Services Pager: [phone] Office: [phone]

## 2021-06-04 NOTE — Progress Notes (Signed)
D/C order noted. Contacted [facility] to make clinic aware pt for d/c today and will resume care on Saturday.   Melven Sartorius Renal Navigator [phone]

## 2021-06-05 ENCOUNTER — Telehealth: Payer: Self-pay | Admitting: Nurse Practitioner

## 2021-06-05 NOTE — Telephone Encounter (Signed)
Transition of care contact from inpatient facility  Date of Discharge: 06/04/2021 Date of Contact: 06/05/2021 Method of contact: Phone  Attempted to contact patient to discuss transition of care from inpatient admission. Patient did not answer the phone. Call straight to VM. Unable to leave message. Mail box is full.

## 2021-06-08 ENCOUNTER — Inpatient Hospital Stay: Payer: Medicaid Other | Admitting: Internal Medicine

## 2021-06-11 ENCOUNTER — Emergency Department (HOSPITAL_COMMUNITY)
Admission: EM | Admit: 2021-06-11 | Discharge: 2021-06-11 | Disposition: A | Discharge status: Home / Self Care | Payer: Medicare Other | Attending: Student | Admitting: Student

## 2021-06-11 ENCOUNTER — Encounter (HOSPITAL_COMMUNITY): Payer: Self-pay | Admitting: Emergency Medicine

## 2021-06-11 ENCOUNTER — Emergency Department (HOSPITAL_COMMUNITY): Payer: Medicare Other

## 2021-06-11 DIAGNOSIS — Z79899 Other long term (current) drug therapy: Secondary | ICD-10-CM | POA: Diagnosis not present

## 2021-06-11 DIAGNOSIS — R1084 Generalized abdominal pain: Secondary | ICD-10-CM

## 2021-06-11 DIAGNOSIS — I132 Hypertensive heart and chronic kidney disease with heart failure and with stage 5 chronic kidney disease, or end stage renal disease: Secondary | ICD-10-CM | POA: Diagnosis not present

## 2021-06-11 DIAGNOSIS — J449 Chronic obstructive pulmonary disease, unspecified: Secondary | ICD-10-CM | POA: Diagnosis not present

## 2021-06-11 DIAGNOSIS — Z992 Dependence on renal dialysis: Secondary | ICD-10-CM | POA: Diagnosis not present

## 2021-06-11 DIAGNOSIS — I5042 Chronic combined systolic (congestive) and diastolic (congestive) heart failure: Secondary | ICD-10-CM | POA: Insufficient documentation

## 2021-06-11 DIAGNOSIS — R079 Chest pain, unspecified: Secondary | ICD-10-CM

## 2021-06-11 DIAGNOSIS — Z8616 Personal history of COVID-19: Secondary | ICD-10-CM | POA: Diagnosis not present

## 2021-06-11 DIAGNOSIS — R110 Nausea: Secondary | ICD-10-CM | POA: Insufficient documentation

## 2021-06-11 DIAGNOSIS — N186 End stage renal disease: Secondary | ICD-10-CM | POA: Diagnosis not present

## 2021-06-11 LAB — COMPREHENSIVE METABOLIC PANEL
ALT: <5 U/L (ref 0–44)
AST: 20 U/L (ref 15–41)
Albumin: 2.6 g/dL — ABNORMAL LOW (ref 3.5–5.0)
Alkaline Phosphatase: 61 U/L (ref 38–126)
Anion gap: 14 (ref 5–15)
BUN: 11 mg/dL (ref 6–20)
CO2: 33 mmol/L — ABNORMAL HIGH (ref 22–32)
Calcium: 7.9 mg/dL — ABNORMAL LOW (ref 8.9–10.3)
Chloride: 91 mmol/L — ABNORMAL LOW (ref 98–111)
Creatinine, Ser: 3.84 mg/dL — ABNORMAL HIGH (ref 0.44–1.00)
GFR, Estimated: 13 mL/min — ABNORMAL LOW (ref >60)
Glucose, Bld: 82 mg/dL (ref 70–99)
Potassium: 3.5 mmol/L (ref 3.5–5.1)
Sodium: 138 mmol/L (ref 135–145)
Total Bilirubin: 0.3 mg/dL (ref 0.3–1.2)
Total Protein: 7.4 g/dL (ref 6.5–8.1)

## 2021-06-11 LAB — CBC WITH DIFFERENTIAL/PLATELET
Abs Immature Granulocytes: 0.02 10*3/uL (ref 0.00–0.07)
Basophils Absolute: 0 10*3/uL (ref 0.0–0.1)
Basophils Relative: 1 %
Eosinophils Absolute: 0.2 10*3/uL (ref 0.0–0.5)
Eosinophils Relative: 2 %
HCT: 26.5 % — ABNORMAL LOW (ref 36.0–46.0)
Hemoglobin: 8.1 g/dL — ABNORMAL LOW (ref 12.0–15.0)
Immature Granulocytes: 0 %
Lymphocytes Relative: 23 %
Lymphs Abs: 1.5 10*3/uL (ref 0.7–4.0)
MCH: 28.9 pg (ref 26.0–34.0)
MCHC: 30.6 g/dL (ref 30.0–36.0)
MCV: 94.6 fL (ref 80.0–100.0)
Monocytes Absolute: 0.8 10*3/uL (ref 0.1–1.0)
Monocytes Relative: 12 %
Neutro Abs: 3.9 10*3/uL (ref 1.7–7.7)
Neutrophils Relative %: 62 %
Platelets: 246 10*3/uL (ref 150–400)
RBC: 2.8 MIL/uL — ABNORMAL LOW (ref 3.87–5.11)
RDW: 20 % — ABNORMAL HIGH (ref 11.5–15.5)
WBC: 6.4 10*3/uL (ref 4.0–10.5)
nRBC: 0 % (ref 0.0–0.2)

## 2021-06-11 LAB — TROPONIN I (HIGH SENSITIVITY)
Troponin I (High Sensitivity): 17 ng/L (ref <18)
Troponin I (High Sensitivity): 16 ng/L (ref <18)

## 2021-06-11 MED ORDER — ONDANSETRON HCL 4 MG/2ML IJ SOLN
4.0000 mg | Freq: Once | INTRAMUSCULAR | Status: AC
  Administered 2021-06-11: 4 mg via INTRAVENOUS
  Filled 2021-06-11: qty 2

## 2021-06-11 MED ORDER — HYDROMORPHONE HCL 1 MG/ML IJ SOLN
0.5000 mg | Freq: Once | INTRAMUSCULAR | Status: AC
  Administered 2021-06-11: 0.5 mg via INTRAVENOUS
  Filled 2021-06-11: qty 1

## 2021-06-11 MED ORDER — IOHEXOL 350 MG/ML SOLN
100.0000 mL | Freq: Once | INTRAVENOUS | Status: AC | PRN
  Administered 2021-06-11: 66 mL via INTRAVENOUS

## 2021-06-11 MED ORDER — ACETAMINOPHEN 325 MG PO TABS
650.0000 mg | ORAL_TABLET | Freq: Four times a day (QID) | ORAL | Status: DC | PRN
  Administered 2021-06-11: 650 mg via ORAL
  Filled 2021-06-11: qty 2

## 2021-06-11 NOTE — ED Provider Notes (Signed)
[facility] [facility] EMERGENCY DEPARTMENT Provider Note   CSN: [id number] Arrival date & time:        History  Chief Complaint  Patient presents with   Chest Pain    Kathleen [name] is a 61 y.o. female with a history of SLE, ESRD on HD TTS, COPD, IDA, prolonged QT, chronic anemia, chronic combined systolic and diastolic HF, HTN, secondary hyperparathyroidism presenting today for new onset chest pain when she was in dialysis today.  Patient mostly completed her dialysis treatment when she began experiencing chest pain that radiated to her left shoulder blade.  Was given 0.4 nitro and 324 mg ASA.  Pain from onset was a 4/10 and is now rated an 8/10.  No radiation to the jaw or shoulder.  Patient states she has not been taking her medications as prescribed, and has not taken any today.  Denies stomach pain, shortness of breath, syncope, fever, lightheadedness.  Was admitted on 05/15/2021 for a rectal bleed, COVID, uremic encephalopathy, and septic arthritis of strep pneumo.  Had a left wrist radiocarpal and midcarpal joint arthrotomy exploration and drainage.  Was discharged 06/04/2021 and is supposedly continuing IV cefazolin outpatient.  The history is provided by the patient and medical records.  Chest Pain Associated symptoms: nausea   Associated symptoms: no abdominal pain, no back pain, no cough, no fever, no palpitations, no shortness of breath and no vomiting       Home Medications Prior to Admission medications   Medication Sig Start Date End Date Taking? Authorizing Provider  acetaminophen (TYLENOL) 325 MG tablet Take 650 mg by mouth every 6 (six) hours as needed for pain.    [provider]  amLODipine (NORVASC) 10 MG tablet Take 1 tablet (10 mg total) by mouth daily. 06/04/21   Geradine Girt, DO  carvedilol (COREG) 3.125 MG tablet Take 1 tablet (3.125 mg total) by mouth 2 (two) times daily with a meal. 06/04/21   Geradine Girt, DO  ceFAZolin (ANCEF) 2-4  GM/100ML-% IVPB Inject 100 mLs (2 g total) into the vein Every Tuesday,Thursday,and Saturday with dialysis. 06/06/21   Geradine Girt, DO  famotidine (PEPCID) 10 MG tablet Take 1 tablet (10 mg total) by mouth daily. 06/04/21   Geradine Girt, DO  Oxycodone HCl 10 MG TABS Take 1 tablet (10 mg total) by mouth every 6 (six) hours as needed for moderate pain. 06/04/21   Geradine Girt, DO  predniSONE (DELTASONE) 5 MG tablet Take 3 tablets (15 mg total) by mouth daily with breakfast. 06/04/21   Geradine Girt, DO      Allergies    Patient has no known allergies.    Review of Systems   Review of Systems  Constitutional:  Negative for chills and fever.  HENT:  Negative for ear pain, rhinorrhea and sore throat.   Eyes:  Negative for pain and visual disturbance.  Respiratory:  Negative for cough, chest tightness and shortness of breath.   Cardiovascular:  Positive for chest pain. Negative for palpitations and leg swelling.  Gastrointestinal:  Positive for diarrhea and nausea. Negative for abdominal pain, constipation and vomiting.  Genitourinary:  Negative for dysuria, flank pain and hematuria.  Musculoskeletal:  Negative for arthralgias, back pain and neck pain.  Skin:  Negative for color change and rash.  Neurological:  Negative for seizures, syncope, facial asymmetry and light-headedness.  All other systems reviewed and are negative.  Physical Exam Updated Vital Signs BP 110/68 (BP Location: Right  Arm)    Pulse 65    Temp 98.1 F (36.7 C) (Oral)    Resp 15    Ht 5\' 4"  (1.626 m)    Wt 50 kg    SpO2 98%    BMI 18.92 kg/m  Physical Exam Vitals and nursing note reviewed. Exam conducted with a chaperone present.  Constitutional:      General: She is not in acute distress.    Appearance: She is well-developed. She is not diaphoretic.     Comments: Left AV fistula  HENT:     Head: Normocephalic and atraumatic.  Eyes:     Conjunctiva/sclera: Conjunctivae normal.  Cardiovascular:     Rate and  Rhythm: Normal rate and regular rhythm.     Pulses:          Radial pulses are 1+ on the right side.     Heart sounds: No murmur heard.    Comments: Unable to assess L radial pulse due to bandage/wrapping Pulmonary:     Effort: Pulmonary effort is normal. No tachypnea, accessory muscle usage or respiratory distress.     Breath sounds: Normal breath sounds.  Chest:     Chest wall: Tenderness present. No deformity or edema.     Comments: TTP radiates to mid-back Abdominal:     Palpations: Abdomen is soft.     Tenderness: There is no abdominal tenderness.  Musculoskeletal:        General: No swelling.     Cervical back: Neck supple.  Skin:    General: Skin is warm and dry.     Capillary Refill: Capillary refill takes less than 2 seconds.  Neurological:     Mental Status: She is alert and oriented to person, place, and time.  Psychiatric:        Mood and Affect: Mood normal.    ED Results / Procedures / Treatments   Labs (all labs ordered are listed, but only abnormal results are displayed) Labs Reviewed  COMPREHENSIVE METABOLIC PANEL - Abnormal; Notable for the following components:      Result Value   Chloride 91 (*)    CO2 33 (*)    Creatinine, Ser 3.84 (*)    Calcium 7.9 (*)    Albumin 2.6 (*)    GFR, Estimated 13 (*)    All other components within normal limits  CBC WITH DIFFERENTIAL/PLATELET - Abnormal; Notable for the following components:   RBC 2.80 (*)    Hemoglobin 8.1 (*)    HCT 26.5 (*)    RDW 20.0 (*)    All other components within normal limits  URINALYSIS, ROUTINE W REFLEX MICROSCOPIC  TROPONIN I (HIGH SENSITIVITY)  TROPONIN I (HIGH SENSITIVITY)    EKG None  Radiology DG Chest 2 View  Result Date: 06/11/2021 CLINICAL DATA:  Chest pain EXAM: CHEST - 2 VIEW COMPARISON:  05/21/2021 FINDINGS: No focal consolidation. No pleural effusion or pneumothorax. Stable cardiomegaly. No acute osseous abnormality. IMPRESSION: No active cardiopulmonary disease.  Electronically Signed   By: Kathreen Devoid M.D.   On: 06/11/2021 14:11   CT Angio Chest PE W/Cm &/Or Wo Cm  Result Date: 06/11/2021 CLINICAL DATA:  Left chest pain EXAM: CT ANGIOGRAPHY CHEST WITH CONTRAST TECHNIQUE: Multidetector CT imaging of the chest was performed using the standard protocol during bolus administration of intravenous contrast. Multiplanar CT image reconstructions and MIPs were obtained to evaluate the vascular anatomy. RADIATION DOSE REDUCTION: This exam was performed according to the departmental dose-optimization program which includes automated  exposure control, adjustment of the mA and/or kV according to patient size and/or use of iterative reconstruction technique. CONTRAST:  51mL OMNIPAQUE IOHEXOL 350 MG/ML SOLN COMPARISON:  08/03/2004 FINDINGS: Cardiovascular: Heart is enlarged in size. There are scattered coronary artery calcifications. There is homogeneous enhancement in thoracic aorta. There is no demonstrable intimal flap. Atherosclerotic plaques and calcifications are seen in the thoracic aorta. There is ectasia of ascending thoracic aorta measuring 4.1 cm. There is mild ectasia of main pulmonary artery measuring 3.3 cm. There are no intraluminal filling defects in the pulmonary artery branches. Mediastinum/Nodes: No significant lymphadenopathy seen. Lungs/Pleura: Scattered blebs and bullae are seen in both lungs, more so in the left apical region. Small linear patchy infiltrate in the posterior right lower lung fields may suggest scarring or subsegmental atelectasis. There is no focal pulmonary consolidation. There is no pleural effusion or pneumothorax. Upper Abdomen: There are small pockets of air in the porta hepatis. Significance of this finding is not clear. Gallbladder fossa is not included in the images. There is no dilation of intrahepatic bile ducts. Extrahepatic bile ducts are not included in the images. Musculoskeletal: Unremarkable. Review of the MIP images confirms the  above findings. IMPRESSION: There is no evidence of pulmonary artery embolism. There is ectasia of main pulmonary artery suggesting possible pulmonary arterial hypertension. There is ectasia of ascending thoracic aorta. There is no evidence of thoracic aortic dissection. Small patchy infiltrate in the posterior right lower lung fields may suggest scarring or subsegmental atelectasis or early pneumonia. Cardiomegaly. COPD. There are small pockets of air in the porta hepatis. This may suggest presence of air in the lumen of bile ducts related to previous sphincterotomy or other etiology such as bowel perforation. Please correlate with clinical symptoms and consider CT of abdomen and pelvis if warranted. Electronically Signed   By: Elmer Picker M.D.   On: 06/11/2021 17:03    Procedures Procedures    Medications Ordered in ED Medications  acetaminophen (TYLENOL) tablet 650 mg (650 mg Oral Given 06/11/21 1439)  iohexol (OMNIPAQUE) 350 MG/ML injection 100 mL (66 mLs Intravenous Contrast Given 06/11/21 1641)  HYDROmorphone (DILAUDID) injection 0.5 mg (0.5 mg Intravenous Given 06/11/21 1802)  ondansetron (ZOFRAN) injection 4 mg (4 mg Intravenous Given 06/11/21 1842)    ED Course/ Medical Decision Making/ A&P                           Medical Decision Making Amount and/or Complexity of Data Reviewed Labs: ordered. Radiology: ordered.  Risk OTC drugs. Prescription drug management.   61 year old female presents to the ED for concern of new onset chest pain during dialysis.  This involves an extensive number of treatment options, and is a complaint that carries with it a high risk of complications and morbidity.  The differential diagnosis includes myocardial event, acute coronary syndrome, pulmonary embolism, pneumothorax, pectoralis muscle strain.  Comorbidities that complicate the patient evaluation include SLE, ESRD on hemodialysis, anemia due to chronic kidney disease, COPD, prolonged QT  syndrome, chronic combined systolic and diastolic heart failure, HTN  Additional history obtained from internal/external records available via epic  Interpretation: I ordered, and personally interpreted labs.  The pertinent results include: Negative troponin of 17, thus unlikely myocardial infarction or ischemia.  Creatinine of 3.84 which is consistent with ESRD on hemodialysis.  Calcium of 7.9, but corrected calcium of 9.0.  Hemoglobin of 8.1 and HCT of 26.5, which is consistent with her previous labs of the  last month or so.  I ordered imaging studies including chest x-ray, CTA of the chest, and CT of the abdomen/pelvis.  I independently visualized and interpreted imaging which showed no acute cardiopulmonary disease on chest x-ray, so unlikely pneumothorax.  CTA of the chest also did not indicate pulmonary embolism, but did suggest possible pulmonary arterial hypertension ectasia of the ascending thoracic aorta, possible early pneumonia or scarring of the lungs, and small pockets of air in the porta hepatis possibly related to a bowel perforation.  CT of the abdomen/pelvis was recommended with her assessment.  I agree with the radiologist interpretation.    I considered ordering CT of the abdomen with contrast but due to the patient's renal function and being on dialysis, decided to wait at this time.  Instead, the CT of the abdomen/pelvis was ordered without contrast.  CT of the abdomen/pelvis without contrast pending  Interventions: I ordered medication including Tylenol for pain management.  Reevaluation of the patient after these medicines showed that the patient's pain has mildly improved from an 8/10 to a 6/10.  I ordered Dilaudid for additional pain management and as needed Zofran for possible associated nausea.  Patient does have a history of prolonged QT syndrome, but upon multiple EKG evaluations today QTc is < 500 ms.  Discussed this with Benedetto Goad PA-C and believe the patient's QTC is  low enough to receive a one time low-dose of Zofran if needed.  Benedetto Goad PA-C requested consultation with the IV team due to retained second access point of the AV fistula upon arrival.  Access had remained due to continued bleeding when the first access point was removed.  Area appeared stabilized upon first encounter and access closed with a syringe.  IV team removed the second access point of the fistula, and bandage was placed to stop bleeding.  Upon multiple reevaluations, bleeding was controlled and only mild tenderness of area remained.  Area did not appear enlarged, erythematous, or hemorrhagic  ED Course Patient has an extensive history of SLE and associated nephritis and is on hemodialysis 3 days a week.  She also has chronic congestive heart failure and hypertension.  She recently had a long admission due to septic arthritis of the left wrist, COVID, rectal bleeding and diarrhea.   Upon discharge from her recent admission, she was recommended to follow-up with: GI for an endoscopy, outpatient orthopedics to reassess her wrist, rheumatology for steroid taper, and pulmonology for lung nodule evaluation.  She has not attended any of these and has not been taking her medications as prescribed, which I suspect may be due to noncompliance.    Abdomen suspiciously tender on physical exam.  Cause is unknown etiology at this time.  CTA of the chest suggested visible air pockets in the porta hepatis which may or may not be related to a bowel perforation.  Considering the patient's recent hospital admission course, significant and complex history, and notable TTP on physical exam, I believe the patient may need admission for further work-up and treatment.  *Social Determinants of Health include reduced access to healthcare and ability of transportation  Care transferred to oncoming provider Dr. Tomi Bamberger who will follow up on remaining work-up and will determine disposition.        Final Clinical  Impression(s) / ED Diagnoses Final diagnoses:  Chest pain, unspecified type  Generalized abdominal pain    Rx / DC Orders ED Discharge Orders     None  Prince Rome, PA-C 42/68/34 [id number]    Teressa Lower, MD 06/12/21 [phone]

## 2021-06-11 NOTE — ED Triage Notes (Signed)
Pt was in dialysis today. Finished treatment then pt started complaining of left sided chest pain to left flank area. Received 1 nitro SL and 324mg  Aspirin. Left arm fistula bleeding at this time with pressure dressing by EMS.   BP 130/80 HR 80 2LPM Eva chronic O2

## 2021-06-11 NOTE — Discharge Instructions (Signed)
Follow up with your primary care doctor to be rechecked

## 2021-06-11 NOTE — ED Provider Notes (Signed)
°  Abdominal pelvic CT does not show any acute findings.  The questionable areas of portal venous gas were actually within the bowel.   Pt asked about her surgeon while she was in the hospital recently.  Provided name regarding follow up.  Evaluation and diagnostic testing in the emergency department does not suggest an emergent condition requiring admission or immediate intervention beyond what has been performed at this time.  The patient is safe for discharge and has been instructed to return immediately for worsening symptoms, change in symptoms or any other concerns.    Dorie Rank, MD 06/11/21 2115

## 2021-06-25 ENCOUNTER — Encounter (HOSPITAL_COMMUNITY): Payer: Self-pay | Admitting: Emergency Medicine

## 2021-06-25 ENCOUNTER — Emergency Department (HOSPITAL_COMMUNITY)
Admission: EM | Admit: 2021-06-25 | Discharge: 2021-06-26 | Disposition: A | Discharge status: Home / Self Care | Payer: Medicare Other | Attending: Emergency Medicine | Admitting: Emergency Medicine

## 2021-06-25 DIAGNOSIS — D649 Anemia, unspecified: Secondary | ICD-10-CM | POA: Insufficient documentation

## 2021-06-25 LAB — CBC
HCT: 23.7 % — ABNORMAL LOW (ref 36.0–46.0)
Hemoglobin: 7.1 g/dL — ABNORMAL LOW (ref 12.0–15.0)
MCH: 30.1 pg (ref 26.0–34.0)
MCHC: 30 g/dL (ref 30.0–36.0)
MCV: 100.4 fL — ABNORMAL HIGH (ref 80.0–100.0)
Platelets: 284 10*3/uL (ref 150–400)
RBC: 2.36 MIL/uL — ABNORMAL LOW (ref 3.87–5.11)
RDW: 21 % — ABNORMAL HIGH (ref 11.5–15.5)
WBC: 6.6 10*3/uL (ref 4.0–10.5)
nRBC: 0.3 % — ABNORMAL HIGH (ref 0.0–0.2)

## 2021-06-25 LAB — COMPREHENSIVE METABOLIC PANEL
ALT: <5 U/L (ref 0–44)
AST: 20 U/L (ref 15–41)
Albumin: 2.6 g/dL — ABNORMAL LOW (ref 3.5–5.0)
Alkaline Phosphatase: 55 U/L (ref 38–126)
Anion gap: 12 (ref 5–15)
BUN: 7 mg/dL (ref 6–20)
CO2: 33 mmol/L — ABNORMAL HIGH (ref 22–32)
Calcium: 8.1 mg/dL — ABNORMAL LOW (ref 8.9–10.3)
Chloride: 94 mmol/L — ABNORMAL LOW (ref 98–111)
Creatinine, Ser: 3.9 mg/dL — ABNORMAL HIGH (ref 0.44–1.00)
GFR, Estimated: 13 mL/min — ABNORMAL LOW (ref >60)
Glucose, Bld: 88 mg/dL (ref 70–99)
Potassium: 3.3 mmol/L — ABNORMAL LOW (ref 3.5–5.1)
Sodium: 139 mmol/L (ref 135–145)
Total Bilirubin: 0.2 mg/dL — ABNORMAL LOW (ref 0.3–1.2)
Total Protein: 7 g/dL (ref 6.5–8.1)

## 2021-06-25 LAB — POC OCCULT BLOOD, ED: Fecal Occult Bld: NEGATIVE

## 2021-06-25 LAB — PREPARE RBC (CROSSMATCH)

## 2021-06-25 MED ORDER — SODIUM CHLORIDE 0.9% IV SOLUTION: Freq: Once | INTRAVENOUS | Status: DC

## 2021-06-25 NOTE — ED Provider Notes (Signed)
[facility] EMERGENCY DEPARTMENT Provider Note   CSN: [id number] Arrival date & time: 06/25/21  1320     History  Chief Complaint  Patient presents with   Anemia    Kathleen [name] is a 61 y.o. female.  61 yo F with a chief complaints of anemia.  The patient had a blood level checked at dialysis and was told that it was lower than it had been and she decided come to the ED for evaluation.  She tells me she has been off of it for the past week or so.  She is a little bit more short of breath and having some trouble concentrating.  She denies any chest pain or pressure.  Has had some abdominal pain that is been ongoing for weeks to months.  She had an episode of grossly bloody stools and was admitted to the hospital for this.  She has not had any episodes like that since.  Denies any dark stool.   Anemia      Home Medications Prior to Admission medications   Medication Sig Start Date End Date Taking? Authorizing Provider  acetaminophen (TYLENOL) 325 MG tablet Take 650 mg by mouth every 6 (six) hours as needed for pain.    [provider]  amLODipine (NORVASC) 10 MG tablet Take 1 tablet (10 mg total) by mouth daily. 06/04/21   Geradine Girt, DO  carvedilol (COREG) 3.125 MG tablet Take 1 tablet (3.125 mg total) by mouth 2 (two) times daily with a meal. 06/04/21   Geradine Girt, DO  ceFAZolin (ANCEF) 2-4 GM/100ML-% IVPB Inject 100 mLs (2 g total) into the vein Every Tuesday,Thursday,and Saturday with dialysis. 06/06/21   Geradine Girt, DO  famotidine (PEPCID) 10 MG tablet Take 1 tablet (10 mg total) by mouth daily. 06/04/21   Geradine Girt, DO  Oxycodone HCl 10 MG TABS Take 1 tablet (10 mg total) by mouth every 6 (six) hours as needed for moderate pain. 06/04/21   Geradine Girt, DO  predniSONE (DELTASONE) 5 MG tablet Take 3 tablets (15 mg total) by mouth daily with breakfast. 06/04/21   Geradine Girt, DO      Allergies    Patient has no known  allergies.    Review of Systems   Review of Systems  Physical Exam Updated Vital Signs BP (!) 148/93    Pulse 91    Temp 99.1 F (37.3 C) (Oral)    Resp (!) 23    SpO2 100%  Physical Exam Vitals and nursing note reviewed.  Constitutional:      General: She is not in acute distress.    Appearance: She is well-developed. She is not diaphoretic.  HENT:     Head: Normocephalic and atraumatic.  Eyes:     Pupils: Pupils are equal, round, and reactive to light.  Cardiovascular:     Rate and Rhythm: Normal rate and regular rhythm.     Heart sounds: No murmur heard.   No friction rub. No gallop.  Pulmonary:     Effort: Pulmonary effort is normal.     Breath sounds: No wheezing or rales.  Abdominal:     General: There is no distension.     Palpations: Abdomen is soft.     Tenderness: There is no abdominal tenderness.  Genitourinary:    Comments: No stool in the vault Musculoskeletal:        General: No tenderness.     Cervical back: Normal  range of motion and neck supple.  Skin:    General: Skin is warm and dry.  Neurological:     Mental Status: She is alert and oriented to person, place, and time.  Psychiatric:        Behavior: Behavior normal.    ED Results / Procedures / Treatments   Labs (all labs ordered are listed, but only abnormal results are displayed) Labs Reviewed  COMPREHENSIVE METABOLIC PANEL - Abnormal; Notable for the following components:      Result Value   Potassium 3.3 (*)    Chloride 94 (*)    CO2 33 (*)    Creatinine, Ser 3.90 (*)    Calcium 8.1 (*)    Albumin 2.6 (*)    Total Bilirubin 0.2 (*)    GFR, Estimated 13 (*)    All other components within normal limits  CBC - Abnormal; Notable for the following components:   RBC 2.36 (*)    Hemoglobin 7.1 (*)    HCT 23.7 (*)    MCV 100.4 (*)    RDW 21.0 (*)    nRBC 0.3 (*)    All other components within normal limits  RESP PANEL BY RT-PCR (FLU A&B, COVID) ARPGX2  POC OCCULT BLOOD, ED  TYPE AND  SCREEN  PREPARE RBC (CROSSMATCH)    EKG None  Radiology No results found.  Procedures Procedures    Medications Ordered in ED Medications  0.9 %  sodium chloride infusion (Manually program via Guardrails IV Fluids) (has no administration in time range)    ED Course/ Medical Decision Making/ A&P                           Medical Decision Making Amount and/or Complexity of Data Reviewed Labs: ordered.  Risk Prescription drug management.   61 yo F with a chief complaints of anemia.  This was checked at dialysis today.  She completed full dialysis session.  Hemoglobin today is 7.1.  When I looked back at her blood work she had normal hemoglobins in the beginning of January and is slowly down trended.  At one point she had a GI bleed in January.  Not sure why she continues to trend downward.  She is slightly macrocytic today.  We will give a unit of blood.  She is somewhat symptomatic.  Will discuss with medicine for admission.  CRITICAL CARE Performed by: Cecilio Asper   Total critical care time: 35 minutes  Critical care time was exclusive of separately billable procedures and treating other patients.  Critical care was necessary to treat or prevent imminent or life-threatening deterioration.  Critical care was time spent personally by me on the following activities: development of treatment plan with patient and/or surrogate as well as nursing, discussions with consultants, evaluation of patient's response to treatment, examination of patient, obtaining history from patient or surrogate, ordering and performing treatments and interventions, ordering and review of laboratory studies, ordering and review of radiographic studies, pulse oximetry and re-evaluation of patient's condition.  The patients results and plan were reviewed and discussed.   Any x-rays performed were independently reviewed by myself.   Differential diagnosis were considered with the presenting  HPI.  Medications  0.9 %  sodium chloride infusion (Manually program via Guardrails IV Fluids) (has no administration in time range)    Vitals:   06/25/21 1350 06/25/21 1654 06/25/21 1934 06/25/21 2018  BP: 120/78 127/77 (!) 162/98 (!) 148/93  Pulse: (!) 106 83 97 91  Resp: 18 20 (!) 23 (!) 23  Temp: 99.2 F (37.3 C)   99.1 F (37.3 C)  TempSrc: Oral   Oral  SpO2: 100% 100% 100% 100%    Final diagnoses:  Symptomatic anemia    Admission/ observation were discussed with the admitting physician, patient and/or family and they are comfortable with the plan.         Final Clinical Impression(s) / ED Diagnoses Final diagnoses:  Symptomatic anemia    Rx / DC Orders ED Discharge Orders     None         Deno Etienne, DO 06/25/21 2031

## 2021-06-25 NOTE — ED Triage Notes (Signed)
Patient sent to ED from dialysis center after routine blood draw shows patient to be anemic. Patient denies any complaints at this time. Dialysis access in left arm, last treatment today. Patient alert, oriented, ambulatory, and in no apparent distress at this time.

## 2021-06-25 NOTE — ED Provider Triage Note (Signed)
Emergency Medicine Provider Triage Evaluation Note  Kathleen [name] , a 61 y.o. female  was evaluated in triage.  Pt complains of low hemoglobin.  States that she was at dialysis earlier today and received full treatment, however was found to have low hemoglobin and was sent here for evaluation.  She denies any associated symptoms of wound shortness of breath or weakness.  Review of Systems  Positive:  Negative: Hematochezia, melena  Physical Exam  BP 120/78 (BP Location: Right Arm)    Pulse (!) 106    Temp 99.2 F (37.3 C) (Oral)    Resp 18    SpO2 100%  Gen:   Awake, no distress   Resp:  Normal effort  MSK:   Moves extremities without difficulty  Other:    Medical Decision Making  Medically screening exam initiated at 2:13 PM.  Appropriate orders placed.  Kathleen [name] was informed that the remainder of the evaluation will be completed by another provider, this initial triage assessment does not replace that evaluation, and the importance of remaining in the ED until their evaluation is complete.     Bud Face, PA-C 06/25/21 1415

## 2021-06-26 LAB — TYPE AND SCREEN
ABO/RH(D): B POS
Antibody Screen: NEGATIVE
Unit division: 0

## 2021-06-26 LAB — BPAM RBC
Blood Product Expiration Date: 202302232359
ISSUE DATE / TIME: 202302162009
Unit Type and Rh: 7300

## 2021-09-07 ENCOUNTER — Inpatient Hospital Stay (HOSPITAL_COMMUNITY)
Admission: EM | Admit: 2021-09-07 | Discharge: 2021-09-11 | DRG: 252 | Disposition: A | Discharge status: Home / Self Care | Payer: Medicare Other | Attending: Internal Medicine | Admitting: Internal Medicine

## 2021-09-07 ENCOUNTER — Emergency Department (HOSPITAL_COMMUNITY): Payer: Medicare Other

## 2021-09-07 DIAGNOSIS — J810 Acute pulmonary edema: Secondary | ICD-10-CM

## 2021-09-07 DIAGNOSIS — Z803 Family history of malignant neoplasm of breast: Secondary | ICD-10-CM

## 2021-09-07 DIAGNOSIS — N186 End stage renal disease: Secondary | ICD-10-CM | POA: Diagnosis present

## 2021-09-07 DIAGNOSIS — G4730 Sleep apnea, unspecified: Secondary | ICD-10-CM | POA: Diagnosis present

## 2021-09-07 DIAGNOSIS — Z7952 Long term (current) use of systemic steroids: Secondary | ICD-10-CM

## 2021-09-07 DIAGNOSIS — M898X9 Other specified disorders of bone, unspecified site: Secondary | ICD-10-CM | POA: Diagnosis present

## 2021-09-07 DIAGNOSIS — D631 Anemia in chronic kidney disease: Secondary | ICD-10-CM | POA: Diagnosis present

## 2021-09-07 DIAGNOSIS — IMO0002 Reserved for concepts with insufficient information to code with codable children: Secondary | ICD-10-CM | POA: Diagnosis present

## 2021-09-07 DIAGNOSIS — Z8673 Personal history of transient ischemic attack (TIA), and cerebral infarction without residual deficits: Secondary | ICD-10-CM

## 2021-09-07 DIAGNOSIS — E440 Moderate protein-calorie malnutrition: Secondary | ICD-10-CM | POA: Diagnosis present

## 2021-09-07 DIAGNOSIS — M3214 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: Secondary | ICD-10-CM | POA: Diagnosis present

## 2021-09-07 DIAGNOSIS — Y832 Surgical operation with anastomosis, bypass or graft as the cause of abnormal reaction of the patient, or of later complication, without mention of misadventure at the time of the procedure: Secondary | ICD-10-CM | POA: Diagnosis present

## 2021-09-07 DIAGNOSIS — J9601 Acute respiratory failure with hypoxia: Secondary | ICD-10-CM | POA: Diagnosis present

## 2021-09-07 DIAGNOSIS — Z8041 Family history of malignant neoplasm of ovary: Secondary | ICD-10-CM

## 2021-09-07 DIAGNOSIS — Z20822 Contact with and (suspected) exposure to covid-19: Secondary | ICD-10-CM | POA: Diagnosis present

## 2021-09-07 DIAGNOSIS — T82818A Embolism of vascular prosthetic devices, implants and grafts, initial encounter: Secondary | ICD-10-CM | POA: Diagnosis present

## 2021-09-07 DIAGNOSIS — I132 Hypertensive heart and chronic kidney disease with heart failure and with stage 5 chronic kidney disease, or end stage renal disease: Principal | ICD-10-CM | POA: Diagnosis present

## 2021-09-07 DIAGNOSIS — J9602 Acute respiratory failure with hypercapnia: Secondary | ICD-10-CM | POA: Diagnosis present

## 2021-09-07 DIAGNOSIS — Z8674 Personal history of sudden cardiac arrest: Secondary | ICD-10-CM

## 2021-09-07 DIAGNOSIS — Z681 Body mass index (BMI) 19 or less, adult: Secondary | ICD-10-CM

## 2021-09-07 DIAGNOSIS — M329 Systemic lupus erythematosus, unspecified: Secondary | ICD-10-CM | POA: Diagnosis present

## 2021-09-07 DIAGNOSIS — Z87898 Personal history of other specified conditions: Secondary | ICD-10-CM

## 2021-09-07 DIAGNOSIS — I10 Essential (primary) hypertension: Secondary | ICD-10-CM | POA: Diagnosis present

## 2021-09-07 DIAGNOSIS — I5043 Acute on chronic combined systolic (congestive) and diastolic (congestive) heart failure: Secondary | ICD-10-CM | POA: Diagnosis present

## 2021-09-07 DIAGNOSIS — Z992 Dependence on renal dialysis: Secondary | ICD-10-CM

## 2021-09-07 DIAGNOSIS — Z79899 Other long term (current) drug therapy: Secondary | ICD-10-CM

## 2021-09-07 DIAGNOSIS — Z808 Family history of malignant neoplasm of other organs or systems: Secondary | ICD-10-CM

## 2021-09-07 DIAGNOSIS — J441 Chronic obstructive pulmonary disease with (acute) exacerbation: Secondary | ICD-10-CM | POA: Diagnosis present

## 2021-09-07 DIAGNOSIS — F17210 Nicotine dependence, cigarettes, uncomplicated: Secondary | ICD-10-CM | POA: Diagnosis present

## 2021-09-07 LAB — I-STAT VENOUS BLOOD GAS, ED
Acid-Base Excess: 3 mmol/L — ABNORMAL HIGH (ref 0.0–2.0)
Acid-base deficit: 5 mmol/L — ABNORMAL HIGH (ref 0.0–2.0)
Bicarbonate: 25.1 mmol/L (ref 20.0–28.0)
Bicarbonate: 28.4 mmol/L — ABNORMAL HIGH (ref 20.0–28.0)
Calcium, Ion: 1.09 mmol/L — ABNORMAL LOW (ref 1.15–1.40)
Calcium, Ion: 1.07 mmol/L — ABNORMAL LOW (ref 1.15–1.40)
HCT: 38 % (ref 36.0–46.0)
HCT: 35 % — ABNORMAL LOW (ref 36.0–46.0)
Hemoglobin: 12.9 g/dL (ref 12.0–15.0)
Hemoglobin: 11.9 g/dL — ABNORMAL LOW (ref 12.0–15.0)
O2 Saturation: 93 %
O2 Saturation: 86 %
Potassium: 4.7 mmol/L (ref 3.5–5.1)
Potassium: 3.8 mmol/L (ref 3.5–5.1)
Sodium: 139 mmol/L (ref 135–145)
Sodium: 139 mmol/L (ref 135–145)
TCO2: 27 mmol/L (ref 22–32)
TCO2: 30 mmol/L (ref 22–32)
pCO2, Ven: 68.2 mmHg — ABNORMAL HIGH (ref 44–60)
pCO2, Ven: 46.9 mmHg (ref 44–60)
pH, Ven: 7.174 — CL (ref 7.25–7.43)
pH, Ven: 7.391 (ref 7.25–7.43)
pO2, Ven: 85 mmHg — ABNORMAL HIGH (ref 32–45)
pO2, Ven: 53 mmHg — ABNORMAL HIGH (ref 32–45)

## 2021-09-07 LAB — CBC WITH DIFFERENTIAL/PLATELET
Abs Immature Granulocytes: 0 10*3/uL (ref 0.00–0.07)
Basophils Absolute: 0.6 10*3/uL — ABNORMAL HIGH (ref 0.0–0.1)
Basophils Relative: 4 %
Eosinophils Absolute: 0.6 10*3/uL — ABNORMAL HIGH (ref 0.0–0.5)
Eosinophils Relative: 4 %
HCT: 39 % (ref 36.0–46.0)
Hemoglobin: 10.7 g/dL — ABNORMAL LOW (ref 12.0–15.0)
Lymphocytes Relative: 48 %
Lymphs Abs: 7.6 10*3/uL — ABNORMAL HIGH (ref 0.7–4.0)
MCH: 26.7 pg (ref 26.0–34.0)
MCHC: 27.4 g/dL — ABNORMAL LOW (ref 30.0–36.0)
MCV: 97.3 fL (ref 80.0–100.0)
Monocytes Absolute: 0.6 10*3/uL (ref 0.1–1.0)
Monocytes Relative: 4 %
Neutro Abs: 6.3 10*3/uL (ref 1.7–7.7)
Neutrophils Relative %: 40 %
Platelets: 295 10*3/uL (ref 150–400)
RBC: 4.01 MIL/uL (ref 3.87–5.11)
RDW: 17.5 % — ABNORMAL HIGH (ref 11.5–15.5)
WBC: 15.8 10*3/uL — ABNORMAL HIGH (ref 4.0–10.5)
nRBC: 0 % (ref 0.0–0.2)
nRBC: 0 /100 WBC

## 2021-09-07 LAB — COMPREHENSIVE METABOLIC PANEL
ALT: 14 U/L (ref 0–44)
AST: 51 U/L — ABNORMAL HIGH (ref 15–41)
Albumin: 3.1 g/dL — ABNORMAL LOW (ref 3.5–5.0)
Alkaline Phosphatase: 63 U/L (ref 38–126)
Anion gap: 15 (ref 5–15)
BUN: 23 mg/dL — ABNORMAL HIGH (ref 6–20)
CO2: 21 mmol/L — ABNORMAL LOW (ref 22–32)
Calcium: 8.8 mg/dL — ABNORMAL LOW (ref 8.9–10.3)
Chloride: 105 mmol/L (ref 98–111)
Creatinine, Ser: 9.32 mg/dL — ABNORMAL HIGH (ref 0.44–1.00)
GFR, Estimated: 4 mL/min — ABNORMAL LOW (ref >60)
Glucose, Bld: 219 mg/dL — ABNORMAL HIGH (ref 70–99)
Potassium: 4.7 mmol/L (ref 3.5–5.1)
Sodium: 141 mmol/L (ref 135–145)
Total Bilirubin: 0.7 mg/dL (ref 0.3–1.2)
Total Protein: 7 g/dL (ref 6.5–8.1)

## 2021-09-07 LAB — TROPONIN I (HIGH SENSITIVITY): Troponin I (High Sensitivity): 35 ng/L — ABNORMAL HIGH (ref <18)

## 2021-09-07 LAB — BRAIN NATRIURETIC PEPTIDE: B Natriuretic Peptide: 3248.1 pg/mL — ABNORMAL HIGH (ref 0.0–100.0)

## 2021-09-07 MED ORDER — SODIUM CHLORIDE 0.9 % IV SOLN: 100.0000 mL | INTRAVENOUS | Status: DC | PRN

## 2021-09-07 MED ORDER — NITROGLYCERIN IN D5W 200-5 MCG/ML-% IV SOLN
0.0000 ug/min | INTRAVENOUS | Status: DC
  Administered 2021-09-07: 50 ug/min via INTRAVENOUS
  Filled 2021-09-07: qty 250

## 2021-09-07 MED ORDER — CHLORHEXIDINE GLUCONATE CLOTH 2 % EX PADS
6.0000 | MEDICATED_PAD | Freq: Every day | CUTANEOUS | Status: DC
  Administered 2021-09-10 – 2021-09-11 (×2): 6 via TOPICAL

## 2021-09-07 MED ORDER — PENTAFLUOROPROP-TETRAFLUOROETH EX AERO: 1.0000 "application " | INHALATION_SPRAY | CUTANEOUS | Status: DC | PRN

## 2021-09-07 MED ORDER — METHYLPREDNISOLONE SODIUM SUCC 125 MG IJ SOLR: 125.0000 mg | Freq: Once | INTRAMUSCULAR | Status: AC

## 2021-09-07 MED ORDER — MAGNESIUM SULFATE 2 GM/50ML IV SOLN
2.0000 g | Freq: Once | INTRAVENOUS | Status: AC
  Administered 2021-09-07: 2 g via INTRAVENOUS
  Filled 2021-09-07: qty 50

## 2021-09-07 MED ORDER — ENALAPRILAT 1.25 MG/ML IV SOLN
1.2500 mg | Freq: Once | INTRAVENOUS | Status: DC
  Filled 2021-09-07: qty 1

## 2021-09-07 MED ORDER — LIDOCAINE HCL (PF) 1 % IJ SOLN: 5.0000 mL | INTRAMUSCULAR | Status: DC | PRN

## 2021-09-07 MED ORDER — METHYLPREDNISOLONE SODIUM SUCC 125 MG IJ SOLR
INTRAMUSCULAR | Status: AC
  Administered 2021-09-07: 125 mg via INTRAVENOUS
  Filled 2021-09-07: qty 2

## 2021-09-07 MED ORDER — LORAZEPAM 2 MG/ML IJ SOLN
1.0000 mg | Freq: Once | INTRAMUSCULAR | Status: AC
  Administered 2021-09-07: 1 mg via INTRAVENOUS
  Filled 2021-09-07: qty 1

## 2021-09-07 MED ORDER — ALBUTEROL SULFATE (2.5 MG/3ML) 0.083% IN NEBU
10.0000 mg | INHALATION_SOLUTION | RESPIRATORY_TRACT | Status: DC
  Administered 2021-09-07: 10 mg via RESPIRATORY_TRACT
  Filled 2021-09-07: qty 12

## 2021-09-07 MED ORDER — ENALAPRILAT 1.25 MG/ML IV SOLN
0.6250 mg | Freq: Once | INTRAVENOUS | Status: DC
  Filled 2021-09-07: qty 0.5

## 2021-09-07 MED ORDER — LIDOCAINE-PRILOCAINE 2.5-2.5 % EX CREA: 1.0000 "application " | TOPICAL_CREAM | CUTANEOUS | Status: DC | PRN

## 2021-09-07 NOTE — ED Triage Notes (Signed)
Pt BIB EMS, pt arrived on a aerosol mask thrashing around, yelling "I can't breathe, what do I have to do to have you help me breathe." Pt stopping IV attempts, refusing CPAP w/ EMS.  ?

## 2021-09-07 NOTE — Consult Note (Addendum)
Reason for Consult: To manage dialysis and dialysis related needs Referring Physician: Dr Christoper Allegra Kathleen [name] is an 61 y.o. female.  HPI: Pt is a 31F with ESRD on HD TTS at [facility], HTN, COPD, lupus nephritis/ cerebritis, h/o seizures, and h/o septic arthritis who is now seen in consultation at the request of Dr Adela Lank for management of ESRD and provision of HD.  Pt herself doesn't provide a lot of history.  She was brought in by EMS acutely SOB- wasn't able to answer a lot of questions on arrival.  Didn't tolerate CPAP very well and was given some Ativan for anxiolysis--> Placed on BiPaP.  Given solumedrol, inhalers, Mg.  Labs remarkable for VBG 7.17/ 68.2/85; BUN 23, Cr 9.32, K 4.7 CO2 21 BNP 3248.  WBC ct 15.1 Hgb 10.7 and plts 295.  CXR showed mild cardiomegaly with R >L interstitial opacity.  In this setting we are asked to see.  Pt reports her last HD was Saturday.  She typically stays her full time.  Left 1 kg under her EDW Saturday.  She is on BiPaP and a nitro gtt now.  Repeat VBG with 7.39/ 46/53  Dialyzes at [facility] TTS 3 hr 15 min EDW 49.5 kg, AVG 2K/ 2Ca bath BFR 350 mL/ min/ DFR A 1.5 F160 UF Profile 2  Mircera 200 q 2 weeks Sensipar 60 mg TIW  Past Medical History:  Diagnosis Date   Anemia    Arthritis    Cardiac arrest (HCC)    Cellulitis    Cerebral vasculitis    COPD (chronic obstructive pulmonary disease) (HCC)    Elevated antinuclear antibody (ANA) level    Endometriosis    ESRD (end stage renal disease) on dialysis (HCC)    Gallstones    Hypertension    Lupus (HCC)    Migraine    PONV (postoperative nausea and vomiting)    Seizures (HCC)    Sepsis (HCC)    Sleep apnea    Suicide attempt (HCC)    TB (tuberculosis)    Transient ischemic attack (TIA)    Vasculitis of skin     Past Surgical History:  Procedure Laterality Date   AV FISTULA PLACEMENT Left 07/04/2017   Procedure: ARTERIOVENOUS (AV) FISTULA CREATION;  Surgeon: Fransisco Hertz, MD;  Location: [facility]  OR;  Service: Vascular;  Laterality: Left;   AV FISTULA PLACEMENT Left 11/27/2019   Procedure: INSERTION OF ARTERIOVENOUS (AV) GORE-TEX GRAFT LEFT UPPER ARM;  Surgeon: Chuck Hint, MD;  Location: [facility] OR;  Service: Vascular;  Laterality: Left;   AV FISTULA PLACEMENT Left 07/29/2020   Procedure: INSERTION OF LEFT UPPER ARM ARTERIOVENOUS (AV) GORE-TEX GRAFT;  Surgeon: Chuck Hint, MD;  Location: [facility] OR;  Service: Vascular;  Laterality: Left;   BUBBLE STUDY  05/25/2021   Procedure: BUBBLE STUDY;  Surgeon: Sande Rives, MD;  Location: [facility] ENDOSCOPY;  Service: Cardiovascular;;   CESAREAN SECTION     ESOPHAGOGASTRODUODENOSCOPY N/A 07/15/2016   Procedure: ESOPHAGOGASTRODUODENOSCOPY (EGD);  Surgeon: Carman Ching, MD;  Location: [facility] ENDOSCOPY;  Service: Endoscopy;  Laterality: N/A;   I & D EXTREMITY Left 05/27/2021   Procedure: IRRIGATION AND DEBRIDEMENT OF WRIST;  Surgeon: Bradly Bienenstock, MD;  Location: [facility] OR;  Service: Orthopedics;  Laterality: Left;   INSERTION OF DIALYSIS CATHETER Right 07/04/2017   Procedure: INSERTION OF TUNNELED  DIALYSIS CATHETER - RIGHT INTERNAL JUGULAR PLACEMENT;  Surgeon: Fransisco Hertz, MD;  Location: [facility] OR;  Service: Vascular;  Laterality: Right;  LUNG BIOPSY     TEE WITHOUT CARDIOVERSION N/A 05/25/2021   Procedure: TRANSESOPHAGEAL ECHOCARDIOGRAM (TEE);  Surgeon: Sande Rives, MD;  Location: [facility] ENDOSCOPY;  Service: Cardiovascular;  Laterality: N/A;   TUBAL LIGATION      Family History  Problem Relation Age of Onset   Ovarian cancer Mother    Breast cancer Sister    Throat cancer Brother     Social History:  reports that she has been smoking cigarettes. She has been smoking an average of .25 packs per day. She has never used smokeless tobacco. She reports that she does not drink alcohol and does not use drugs.  Allergies: No Known Allergies  Medications: Prior to Admission: (Not in a hospital admission)    Results for orders placed or  performed during the hospital encounter of 09/07/21 (from the past 48 hour(s))  CBC with Differential     Status: Abnormal   Collection Time: 09/07/21  7:55 PM  Result Value Ref Range   WBC 15.8 (H) 4.0 - 10.5 K/uL   RBC 4.01 3.87 - 5.11 MIL/uL   Hemoglobin 10.7 (L) 12.0 - 15.0 g/dL   HCT 78.2 95.6 - 21.3 %   MCV 97.3 80.0 - 100.0 fL   MCH 26.7 26.0 - 34.0 pg   MCHC 27.4 (L) 30.0 - 36.0 g/dL   RDW 08.6 (H) 57.8 - 46.9 %   Platelets 295 150 - 400 K/uL   nRBC 0.0 0.0 - 0.2 %   Neutrophils Relative % 40 %   Neutro Abs 6.3 1.7 - 7.7 K/uL   Lymphocytes Relative 48 %   Lymphs Abs 7.6 (H) 0.7 - 4.0 K/uL   Monocytes Relative 4 %   Monocytes Absolute 0.6 0.1 - 1.0 K/uL   Eosinophils Relative 4 %   Eosinophils Absolute 0.6 (H) 0.0 - 0.5 K/uL   Basophils Relative 4 %   Basophils Absolute 0.6 (H) 0.0 - 0.1 K/uL   nRBC 0 0 /100 WBC   Abs Immature Granulocytes 0.00 0.00 - 0.07 K/uL   Polychromasia PRESENT     Comment: Performed at [facility], [address] [place], [place] [address]  Comprehensive metabolic panel     Status: Abnormal   Collection Time: 09/07/21  7:55 PM  Result Value Ref Range   Sodium 141 135 - 145 mmol/L   Potassium 4.7 3.5 - 5.1 mmol/L   Chloride 105 98 - 111 mmol/L   CO2 21 (L) 22 - 32 mmol/L   Glucose, Bld 219 (H) 70 - 99 mg/dL    Comment: Glucose reference range applies only to samples taken after fasting for at least 8 hours.   BUN 23 (H) 6 - 20 mg/dL   Creatinine, Ser 8.41 (H) 0.44 - 1.00 mg/dL   Calcium 8.8 (L) 8.9 - 10.3 mg/dL   Total Protein 7.0 6.5 - 8.1 g/dL   Albumin 3.1 (L) 3.5 - 5.0 g/dL   AST 51 (H) 15 - 41 U/L   ALT 14 0 - 44 U/L   Alkaline Phosphatase 63 38 - 126 U/L   Total Bilirubin 0.7 0.3 - 1.2 mg/dL   GFR, Estimated 4 (L) >60 mL/min    Comment: (NOTE) Calculated using the CKD-EPI Creatinine Equation (2021)    Anion gap 15 5 - 15    Comment: Performed at [facility], [address] [place], [place] [address]  Troponin I  (High Sensitivity)     Status: Abnormal   Collection Time:  09/07/21  7:55 PM  Result Value Ref Range   Troponin I (High Sensitivity) 35 (H) <18 ng/L    Comment: (NOTE) Elevated high sensitivity troponin I (hsTnI) values and significant  changes across serial measurements may suggest ACS but many other  chronic and acute conditions are known to elevate hsTnI results.  Refer to the "Links" section for chest pain algorithms and additional  guidance. Performed at [facility], [address] [place], [place] [address]   Brain natriuretic peptide     Status: Abnormal   Collection Time: 09/07/21  7:55 PM  Result Value Ref Range   B Natriuretic Peptide 3,248.1 (H) 0.0 - 100.0 pg/mL    Comment: Performed at [facility], [address], [place], [place] [address]  I-Stat venous blood gas, ED     Status: Abnormal   Collection Time: 09/07/21  8:08 PM  Result Value Ref Range   pH, Ven 7.174 (LL) 7.25 - 7.43   pCO2, Ven 68.2 (H) 44 - 60 mmHg   pO2, Ven 85 (H) 32 - 45 mmHg   Bicarbonate 25.1 20.0 - 28.0 mmol/L   TCO2 27 22 - 32 mmol/L   O2 Saturation 93 %   Acid-base deficit 5.0 (H) 0.0 - 2.0 mmol/L   Sodium 139 135 - 145 mmol/L   Potassium 4.7 3.5 - 5.1 mmol/L   Calcium, Ion 1.09 (L) 1.15 - 1.40 mmol/L   HCT 38.0 36.0 - 46.0 %   Hemoglobin 12.9 12.0 - 15.0 g/dL   Sample type VENOUS    Comment NOTIFIED PHYSICIAN   I-Stat venous blood gas, ED     Status: Abnormal   Collection Time: 09/07/21 10:43 PM  Result Value Ref Range   pH, Ven 7.391 7.25 - 7.43   pCO2, Ven 46.9 44 - 60 mmHg   pO2, Ven 53 (H) 32 - 45 mmHg   Bicarbonate 28.4 (H) 20.0 - 28.0 mmol/L   TCO2 30 22 - 32 mmol/L   O2 Saturation 86 %   Acid-Base Excess 3.0 (H) 0.0 - 2.0 mmol/L   Sodium 139 135 - 145 mmol/L   Potassium 3.8 3.5 - 5.1 mmol/L   Calcium, Ion 1.07 (L) 1.15 - 1.40 mmol/L   HCT 35.0 (L) 36.0 - 46.0 %   Hemoglobin 11.9 (L) 12.0 - 15.0 g/dL   Sample type VENOUS     DG Chest Port 1 View  Result  Date: 09/07/2021 CLINICAL DATA:  Shortness of breath EXAM: PORTABLE CHEST 1 VIEW COMPARISON:  07/04/2021 FINDINGS: Mild cardiomegaly. Mild right greater than left interstitial opacity. No pneumothorax or sizable pleural effusion. No focal airspace consolidation. IMPRESSION: Mild cardiomegaly and mild right greater than left interstitial opacity, which may indicate mild pulmonary edema. Electronically Signed   By: Deatra Robinson M.D.   On: 09/07/2021 20:22    ROS: not able to get a great ROS but notes SOB and maybe some increased fluids Blood pressure (!) 135/93, pulse 83, resp. rate (!) 24, SpO2 100 %. GEN sleepy, on BiPaP HEENT eyes intermittently open NECK + JVD PULM some wheezing CV tachycardic ABD soft EXT 1+ LE edema NEURO arousable to direct questioning ACCESS L AVG no bruit  Assessment/Plan: 1 Acute hypoxic and hypercarbic RF: Improved with Mg. Solumedrol, inhalers, BiPaP.  Potentially some fluid component to it but left 1 kg under her EDW last rx- was clearly being challenged as an OP.  Will need to declot graft vs obtain temp line access- will put  NPO past MN and hopefully can be weaned off BiPaP overnight so we can declot.  If not, then will need temp line (nontunneled would be OK) to get her dialysis and then deal with AVG. 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 Metabolic Bone Disease: binders/ vitamins when eating 6.  Dispo: pending  Bufford Buttner 09/07/2021, 10:54 PM

## 2021-09-07 NOTE — ED Notes (Signed)
Verbal from Deno Etienne, DO to hold enalaprilat ? ?

## 2021-09-07 NOTE — ED Provider Notes (Signed)
?[place] ?Provider Note ? ? ?CSN: [id number] ?Arrival date & time: 09/07/21  1938 ? ?  ? ?History ? ?Chief Complaint  ?Patient presents with  ? Respiratory Distress  ? ? ?Kathleen [name] is a 61 y.o. female. ? ?61 yo F with a chief complaint of shortness of breath.  Been going on all day today.  Very agitated on EMS arrival.  Unable to tolerate CPAP.  Unable to establish IV access.  Patient will answer very short questions.  Tells me that she has been short of breath today.  Tells me she is having trouble breathing now.  History is limited due to acute shortness of breath. ? ? ? ?  ? ?Home Medications ?Prior to Admission medications   ?Medication Sig Start Date End Date Taking? Authorizing Provider  ?acetaminophen (TYLENOL) 325 MG tablet Take 650 mg by mouth every 6 (six) hours as needed for pain.    [provider]  ?amLODipine (NORVASC) 10 MG tablet Take 1 tablet (10 mg total) by mouth daily. 06/04/21   Geradine Girt, DO  ?carvedilol (COREG) 3.125 MG tablet Take 1 tablet (3.125 mg total) by mouth 2 (two) times daily with a meal. 06/04/21   Geradine Girt, DO  ?ceFAZolin (ANCEF) 2-4 GM/100ML-% IVPB Inject 100 mLs (2 g total) into the vein Every Tuesday,Thursday,and Saturday with dialysis. 06/06/21   Geradine Girt, DO  ?famotidine (PEPCID) 10 MG tablet Take 1 tablet (10 mg total) by mouth daily. 06/04/21   Geradine Girt, DO  ?Oxycodone HCl 10 MG TABS Take 1 tablet (10 mg total) by mouth every 6 (six) hours as needed for moderate pain. 06/04/21   Geradine Girt, DO  ?predniSONE (DELTASONE) 5 MG tablet Take 3 tablets (15 mg total) by mouth daily with breakfast. 06/04/21   Geradine Girt, DO  ?   ? ?Allergies    ?Patient has no known allergies.   ? ?Review of Systems   ?Review of Systems ? ?Physical Exam ?Updated Vital Signs ?BP (!) 135/93   Pulse 83   Resp (!) 24   SpO2 100%  ?Physical Exam ?Vitals and nursing note reviewed.  ?Constitutional:   ?   General: She  is not in acute distress. ?   Appearance: She is well-developed. She is not diaphoretic.  ?HENT:  ?   Head: Normocephalic and atraumatic.  ?   Comments: JVD to the angle of the jaw. ?Eyes:  ?   Pupils: Pupils are equal, round, and reactive to light.  ?Cardiovascular:  ?   Rate and Rhythm: Normal rate and regular rhythm.  ?   Heart sounds: No murmur heard. ?  No friction rub. No gallop.  ?Pulmonary:  ?   Effort: Pulmonary effort is normal.  ?   Breath sounds: Wheezing present. No rales.  ?   Comments: Tachypnea with diffuse wheezes. ?Abdominal:  ?   General: There is no distension.  ?   Palpations: Abdomen is soft.  ?   Tenderness: There is no abdominal tenderness.  ?Musculoskeletal:     ?   General: No tenderness.  ?   Cervical back: Normal range of motion and neck supple.  ?Skin: ?   General: Skin is warm and dry.  ?Neurological:  ?   Mental Status: She is alert and oriented to person, place, and time.  ?Psychiatric:     ?   Behavior: Behavior normal.  ? ? ?ED Results / Procedures / Treatments   ?  Labs ?(all labs ordered are listed, but only abnormal results are displayed) ?Labs Reviewed  ?CBC WITH DIFFERENTIAL/PLATELET - Abnormal; Notable for the following components:  ?    Result Value  ? WBC 15.8 (*)   ? Hemoglobin 10.7 (*)   ? MCHC 27.4 (*)   ? RDW 17.5 (*)   ? Lymphs Abs 7.6 (*)   ? Eosinophils Absolute 0.6 (*)   ? Basophils Absolute 0.6 (*)   ? All other components within normal limits  ?COMPREHENSIVE METABOLIC PANEL - Abnormal; Notable for the following components:  ? CO2 21 (*)   ? Glucose, Bld 219 (*)   ? BUN 23 (*)   ? Creatinine, Ser 9.32 (*)   ? Calcium 8.8 (*)   ? Albumin 3.1 (*)   ? AST 51 (*)   ? GFR, Estimated 4 (*)   ? All other components within normal limits  ?BRAIN NATRIURETIC PEPTIDE - Abnormal; Notable for the following components:  ? B Natriuretic Peptide 3,248.1 (*)   ? All other components within normal limits  ?I-STAT VENOUS BLOOD GAS, ED - Abnormal; Notable for the following components:  ?  pH, Ven 7.174 (*)   ? pCO2, Ven 68.2 (*)   ? pO2, Ven 85 (*)   ? Acid-base deficit 5.0 (*)   ? Calcium, Ion 1.09 (*)   ? All other components within normal limits  ?I-STAT VENOUS BLOOD GAS, ED - Abnormal; Notable for the following components:  ? pO2, Ven 53 (*)   ? Bicarbonate 28.4 (*)   ? Acid-Base Excess 3.0 (*)   ? Calcium, Ion 1.07 (*)   ? HCT 35.0 (*)   ? Hemoglobin 11.9 (*)   ? All other components within normal limits  ?TROPONIN I (HIGH SENSITIVITY) - Abnormal; Notable for the following components:  ? Troponin I (High Sensitivity) 35 (*)   ? All other components within normal limits  ?PATHOLOGIST SMEAR REVIEW  ?BLOOD GAS, VENOUS  ? ? ?EKG ?EKG Interpretation ? ?Date/Time:  Monday Sep 07 2021 20:02:30 EDT ?Ventricular Rate:  113 ?PR Interval:  161 ?QRS Duration: 103 ?QT Interval:  362 ?QTC Calculation: 497 ?R Axis:   72 ?Text Interpretation: Sinus tachycardia Probable left atrial enlargement Anteroseptal infarct, age indeterminate No significant change since last tracing Confirmed by Deno Etienne [phone]) on 09/07/2021 8:11:52 PM ? ?Radiology ?DG Chest Port 1 View ? ?Result Date: 09/07/2021 ?CLINICAL DATA:  Shortness of breath EXAM: PORTABLE CHEST 1 VIEW COMPARISON:  07/04/2021 FINDINGS: Mild cardiomegaly. Mild right greater than left interstitial opacity. No pneumothorax or sizable pleural effusion. No focal airspace consolidation. IMPRESSION: Mild cardiomegaly and mild right greater than left interstitial opacity, which may indicate mild pulmonary edema. Electronically Signed   By: Ulyses Jarred M.D.   On: 09/07/2021 20:22   ? ?Procedures ?Procedures  ? ?EJ placement: 18 gauge IV placed in R EJ. Skin prepped with alcohol pads, R EJ identified with Valsalva. Cannulated with good return of dark, non-pulsatile blood. Tachyderm placed after easily flushed with NS. ? ? ? ?Medications Ordered in ED ?Medications  ?albuterol (PROVENTIL) (2.5 MG/3ML) 0.083% nebulizer solution 10 mg (0 mg Nebulization Stopped 09/07/21 2055)   ?nitroGLYCERIN 50 mg in dextrose 5 % 250 mL (0.2 mg/mL) infusion (50 mcg/min Intravenous Infusion Verify 09/07/21 2116)  ?enalaprilat (VASOTEC) injection 0.625 mg (0 mg Intravenous Hold 09/07/21 2118)  ?LORazepam (ATIVAN) injection 1 mg (1 mg Intravenous Given 09/07/21 1952)  ?magnesium sulfate IVPB 2 g 50 mL (0 g Intravenous Stopped 09/07/21  2053)  ?methylPREDNISolone sodium succinate (SOLU-MEDROL) 125 mg/2 mL injection 125 mg (125 mg Intravenous Given 09/07/21 2001)  ? ? ?ED Course/ Medical Decision Making/ A&P ?  ?                        ?Medical Decision Making ?Amount and/or Complexity of Data Reviewed ?Labs: ordered. ?Radiology: ordered. ?ECG/medicine tests: ordered. ? ?Risk ?Prescription drug management. ? ? ?61 yo F with a chief complaints of shortness of breath.  Been going on since this morning.  The patient is unable to provide much history due to her severe shortness of breath.  On my record review the patient has had multiple presentations for something similar and was in acute pulmonary edema.  This time however she has significant wheezes, for her social history in the chart she has been smoking a pack of cigarettes for 46 years.  She has no history of COPD and is not on any beta agonist.  Was given a DuoNeb and a 5 mg albuterol treatment with some mild improvement. ? ?We will attempt to put her on CPAP.  Chest x-ray blood work.  Reassess. ? ?The patient's fianc? has shown up.  Tells me that she was well yesterday and this morning had some mild complaints and then after breakfast started complaining more severely about difficulty breathing.  She initially told him she did not need to come to the hospital and then things got rapidly worse and EMS were called. ? ?Patient's repeat VBG with significant improvement.  Will discuss with medicine. ? ?CRITICAL CARE ?Performed by: Cecilio Asper ? ? ?Total critical care time: 80 minutes ? ?Critical care time was exclusive of separately billable procedures and  treating other patients. ? ?Critical care was necessary to treat or prevent imminent or life-threatening deterioration. ? ?Critical care was time spent personally by me on the following activities: developme

## 2021-09-08 ENCOUNTER — Encounter (HOSPITAL_COMMUNITY): Payer: Self-pay | Admitting: Internal Medicine

## 2021-09-08 ENCOUNTER — Observation Stay (HOSPITAL_COMMUNITY): Payer: Medicare Other

## 2021-09-08 ENCOUNTER — Other Ambulatory Visit: Payer: Self-pay

## 2021-09-08 DIAGNOSIS — J810 Acute pulmonary edema: Secondary | ICD-10-CM

## 2021-09-08 DIAGNOSIS — Z808 Family history of malignant neoplasm of other organs or systems: Secondary | ICD-10-CM | POA: Diagnosis not present

## 2021-09-08 DIAGNOSIS — G4730 Sleep apnea, unspecified: Secondary | ICD-10-CM | POA: Diagnosis present

## 2021-09-08 DIAGNOSIS — Z79899 Other long term (current) drug therapy: Secondary | ICD-10-CM | POA: Diagnosis not present

## 2021-09-08 DIAGNOSIS — T82818A Embolism of vascular prosthetic devices, implants and grafts, initial encounter: Secondary | ICD-10-CM | POA: Diagnosis present

## 2021-09-08 DIAGNOSIS — J9602 Acute respiratory failure with hypercapnia: Secondary | ICD-10-CM

## 2021-09-08 DIAGNOSIS — F17210 Nicotine dependence, cigarettes, uncomplicated: Secondary | ICD-10-CM | POA: Diagnosis present

## 2021-09-08 DIAGNOSIS — Z8674 Personal history of sudden cardiac arrest: Secondary | ICD-10-CM | POA: Diagnosis not present

## 2021-09-08 DIAGNOSIS — M898X9 Other specified disorders of bone, unspecified site: Secondary | ICD-10-CM | POA: Diagnosis present

## 2021-09-08 DIAGNOSIS — I132 Hypertensive heart and chronic kidney disease with heart failure and with stage 5 chronic kidney disease, or end stage renal disease: Secondary | ICD-10-CM | POA: Diagnosis present

## 2021-09-08 DIAGNOSIS — Z992 Dependence on renal dialysis: Secondary | ICD-10-CM | POA: Diagnosis not present

## 2021-09-08 DIAGNOSIS — N186 End stage renal disease: Secondary | ICD-10-CM

## 2021-09-08 DIAGNOSIS — D631 Anemia in chronic kidney disease: Secondary | ICD-10-CM | POA: Diagnosis present

## 2021-09-08 DIAGNOSIS — J9601 Acute respiratory failure with hypoxia: Secondary | ICD-10-CM | POA: Diagnosis present

## 2021-09-08 DIAGNOSIS — Z8673 Personal history of transient ischemic attack (TIA), and cerebral infarction without residual deficits: Secondary | ICD-10-CM | POA: Diagnosis not present

## 2021-09-08 DIAGNOSIS — I5043 Acute on chronic combined systolic (congestive) and diastolic (congestive) heart failure: Secondary | ICD-10-CM | POA: Diagnosis present

## 2021-09-08 DIAGNOSIS — Z20822 Contact with and (suspected) exposure to covid-19: Secondary | ICD-10-CM | POA: Diagnosis present

## 2021-09-08 DIAGNOSIS — J441 Chronic obstructive pulmonary disease with (acute) exacerbation: Secondary | ICD-10-CM | POA: Diagnosis present

## 2021-09-08 DIAGNOSIS — Z681 Body mass index (BMI) 19 or less, adult: Secondary | ICD-10-CM | POA: Diagnosis not present

## 2021-09-08 DIAGNOSIS — Y832 Surgical operation with anastomosis, bypass or graft as the cause of abnormal reaction of the patient, or of later complication, without mention of misadventure at the time of the procedure: Secondary | ICD-10-CM | POA: Diagnosis present

## 2021-09-08 DIAGNOSIS — I10 Essential (primary) hypertension: Secondary | ICD-10-CM

## 2021-09-08 DIAGNOSIS — M3214 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: Secondary | ICD-10-CM | POA: Diagnosis present

## 2021-09-08 DIAGNOSIS — Z8041 Family history of malignant neoplasm of ovary: Secondary | ICD-10-CM | POA: Diagnosis not present

## 2021-09-08 DIAGNOSIS — Z803 Family history of malignant neoplasm of breast: Secondary | ICD-10-CM | POA: Diagnosis not present

## 2021-09-08 DIAGNOSIS — Z7952 Long term (current) use of systemic steroids: Secondary | ICD-10-CM | POA: Diagnosis not present

## 2021-09-08 DIAGNOSIS — E440 Moderate protein-calorie malnutrition: Secondary | ICD-10-CM | POA: Diagnosis present

## 2021-09-08 DIAGNOSIS — Z87898 Personal history of other specified conditions: Secondary | ICD-10-CM

## 2021-09-08 HISTORY — PX: IR US GUIDE VASC ACCESS RIGHT: IMG2390

## 2021-09-08 HISTORY — PX: IR FLUORO GUIDE CV LINE RIGHT: IMG2283

## 2021-09-08 LAB — HEPATITIS B SURFACE ANTIGEN: Hepatitis B Surface Ag: NONREACTIVE

## 2021-09-08 LAB — RENAL FUNCTION PANEL
Albumin: 3.1 g/dL — ABNORMAL LOW (ref 3.5–5.0)
Anion gap: 20 — ABNORMAL HIGH (ref 5–15)
BUN: 28 mg/dL — ABNORMAL HIGH (ref 6–20)
CO2: 18 mmol/L — ABNORMAL LOW (ref 22–32)
Calcium: 9.1 mg/dL (ref 8.9–10.3)
Chloride: 104 mmol/L (ref 98–111)
Creatinine, Ser: 9.88 mg/dL — ABNORMAL HIGH (ref 0.44–1.00)
GFR, Estimated: 4 mL/min — ABNORMAL LOW (ref >60)
Glucose, Bld: 123 mg/dL — ABNORMAL HIGH (ref 70–99)
Phosphorus: 2.9 mg/dL (ref 2.5–4.6)
Potassium: 4.3 mmol/L (ref 3.5–5.1)
Sodium: 142 mmol/L (ref 135–145)

## 2021-09-08 LAB — SEDIMENTATION RATE: Sed Rate: 15 mm/hr (ref 0–22)

## 2021-09-08 LAB — CBC
HCT: 38.6 % (ref 36.0–46.0)
Hemoglobin: 11 g/dL — ABNORMAL LOW (ref 12.0–15.0)
MCH: 27 pg (ref 26.0–34.0)
MCHC: 28.5 g/dL — ABNORMAL LOW (ref 30.0–36.0)
MCV: 94.8 fL (ref 80.0–100.0)
Platelets: 172 10*3/uL (ref 150–400)
RBC: 4.07 MIL/uL (ref 3.87–5.11)
RDW: 17.2 % — ABNORMAL HIGH (ref 11.5–15.5)
WBC: 5.3 10*3/uL (ref 4.0–10.5)
nRBC: 0 % (ref 0.0–0.2)

## 2021-09-08 LAB — BASIC METABOLIC PANEL
Anion gap: 14 (ref 5–15)
BUN: 26 mg/dL — ABNORMAL HIGH (ref 6–20)
CO2: 23 mmol/L (ref 22–32)
Calcium: 8.8 mg/dL — ABNORMAL LOW (ref 8.9–10.3)
Chloride: 101 mmol/L (ref 98–111)
Creatinine, Ser: 9.74 mg/dL — ABNORMAL HIGH (ref 0.44–1.00)
GFR, Estimated: 4 mL/min — ABNORMAL LOW (ref >60)
Glucose, Bld: 124 mg/dL — ABNORMAL HIGH (ref 70–99)
Potassium: 3.9 mmol/L (ref 3.5–5.1)
Sodium: 138 mmol/L (ref 135–145)

## 2021-09-08 LAB — HIV ANTIBODY (ROUTINE TESTING W REFLEX): HIV Screen 4th Generation wRfx: NONREACTIVE

## 2021-09-08 LAB — HEPATITIS B SURFACE ANTIBODY,QUALITATIVE: Hep B S Ab: NONREACTIVE

## 2021-09-08 LAB — C-REACTIVE PROTEIN: CRP: 0.5 mg/dL (ref <1.0)

## 2021-09-08 LAB — RESP PANEL BY RT-PCR (FLU A&B, COVID) ARPGX2
Influenza A by PCR: NEGATIVE
Influenza B by PCR: NEGATIVE
SARS Coronavirus 2 by RT PCR: NEGATIVE

## 2021-09-08 LAB — PROCALCITONIN: Procalcitonin: 1.23 ng/mL

## 2021-09-08 LAB — PATHOLOGIST SMEAR REVIEW

## 2021-09-08 MED ORDER — IPRATROPIUM-ALBUTEROL 0.5-2.5 (3) MG/3ML IN SOLN
3.0000 mL | Freq: Three times a day (TID) | RESPIRATORY_TRACT | Status: DC
  Administered 2021-09-08: 3 mL via RESPIRATORY_TRACT
  Filled 2021-09-08: qty 3

## 2021-09-08 MED ORDER — BUDESONIDE 0.25 MG/2ML IN SUSP
0.2500 mg | Freq: Two times a day (BID) | RESPIRATORY_TRACT | Status: DC
  Administered 2021-09-08 – 2021-09-11 (×6): 0.25 mg via RESPIRATORY_TRACT
  Filled 2021-09-08 (×7): qty 2

## 2021-09-08 MED ORDER — MIDAZOLAM HCL 2 MG/2ML IJ SOLN
INTRAMUSCULAR | Status: AC
  Filled 2021-09-08: qty 2

## 2021-09-08 MED ORDER — PREDNISONE 20 MG PO TABS: 40.0000 mg | ORAL_TABLET | Freq: Every day | ORAL | Status: DC

## 2021-09-08 MED ORDER — SEVELAMER CARBONATE 800 MG PO TABS
2400.0000 mg | ORAL_TABLET | Freq: Three times a day (TID) | ORAL | Status: DC
  Administered 2021-09-08 – 2021-09-11 (×7): 2400 mg via ORAL
  Filled 2021-09-08 (×7): qty 3

## 2021-09-08 MED ORDER — GELATIN ABSORBABLE 12-7 MM EX MISC
CUTANEOUS | Status: AC
  Administered 2021-09-08: 1
  Filled 2021-09-08: qty 1

## 2021-09-08 MED ORDER — CEFAZOLIN SODIUM-DEXTROSE 1-4 GM/50ML-% IV SOLN
1.0000 g | Freq: Once | INTRAVENOUS | Status: AC
  Administered 2021-09-08: 1 g via INTRAVENOUS

## 2021-09-08 MED ORDER — UMECLIDINIUM BROMIDE 62.5 MCG/ACT IN AEPB
1.0000 | INHALATION_SPRAY | Freq: Every day | RESPIRATORY_TRACT | Status: DC
  Filled 2021-09-08: qty 7

## 2021-09-08 MED ORDER — HEPARIN SODIUM (PORCINE) 1000 UNIT/ML DIALYSIS
1000.0000 [IU] | INTRAMUSCULAR | Status: DC | PRN
  Administered 2021-09-08: 1000 [IU] via INTRAVENOUS_CENTRAL
  Filled 2021-09-08 (×3): qty 1

## 2021-09-08 MED ORDER — CEFTRIAXONE SODIUM 2 G IJ SOLR
2.0000 g | INTRAMUSCULAR | Status: DC
  Administered 2021-09-08 – 2021-09-09 (×2): 2 g via INTRAVENOUS
  Filled 2021-09-08 (×2): qty 20

## 2021-09-08 MED ORDER — SEVELAMER CARBONATE 800 MG PO TABS
800.0000 mg | ORAL_TABLET | ORAL | Status: DC | PRN
  Administered 2021-09-08: 800 mg via ORAL
  Filled 2021-09-08: qty 1

## 2021-09-08 MED ORDER — IPRATROPIUM-ALBUTEROL 0.5-2.5 (3) MG/3ML IN SOLN
3.0000 mL | Freq: Two times a day (BID) | RESPIRATORY_TRACT | Status: DC
  Administered 2021-09-09 – 2021-09-11 (×4): 3 mL via RESPIRATORY_TRACT
  Filled 2021-09-08 (×5): qty 3

## 2021-09-08 MED ORDER — MIDAZOLAM HCL 2 MG/2ML IJ SOLN
INTRAMUSCULAR | Status: AC | PRN
  Administered 2021-09-08 (×2): .5 mg via INTRAVENOUS

## 2021-09-08 MED ORDER — RENA-VITE PO TABS
1.0000 | ORAL_TABLET | Freq: Every day | ORAL | Status: DC
  Administered 2021-09-08 – 2021-09-11 (×4): 1 via ORAL
  Filled 2021-09-08 (×4): qty 1

## 2021-09-08 MED ORDER — CEFAZOLIN SODIUM-DEXTROSE 2-4 GM/100ML-% IV SOLN
INTRAVENOUS | Status: AC
  Filled 2021-09-08: qty 100

## 2021-09-08 MED ORDER — ALBUTEROL SULFATE (2.5 MG/3ML) 0.083% IN NEBU: 2.5000 mg | INHALATION_SOLUTION | RESPIRATORY_TRACT | Status: DC | PRN

## 2021-09-08 MED ORDER — LIDOCAINE HCL 1 % IJ SOLN
INTRAMUSCULAR | Status: AC
  Administered 2021-09-08: 10 mL
  Filled 2021-09-08: qty 20

## 2021-09-08 MED ORDER — AMLODIPINE BESYLATE 10 MG PO TABS
10.0000 mg | ORAL_TABLET | Freq: Every day | ORAL | Status: DC
  Administered 2021-09-08 – 2021-09-11 (×4): 10 mg via ORAL
  Filled 2021-09-08 (×3): qty 1
  Filled 2021-09-08: qty 2

## 2021-09-08 MED ORDER — HYDROXYCHLOROQUINE SULFATE 200 MG PO TABS
200.0000 mg | ORAL_TABLET | Freq: Every day | ORAL | Status: DC
  Administered 2021-09-08 – 2021-09-11 (×4): 200 mg via ORAL
  Filled 2021-09-08 (×5): qty 1

## 2021-09-08 MED ORDER — HEPARIN SODIUM (PORCINE) 1000 UNIT/ML IJ SOLN
INTRAMUSCULAR | Status: AC
  Administered 2021-09-08: 2.12 [IU]
  Filled 2021-09-08: qty 10

## 2021-09-08 MED ORDER — FLUTICASONE PROPIONATE 50 MCG/ACT NA SUSP
2.0000 | Freq: Every day | NASAL | Status: DC
  Administered 2021-09-09 – 2021-09-11 (×3): 2 via NASAL
  Filled 2021-09-08 (×2): qty 16

## 2021-09-08 MED ORDER — SODIUM CHLORIDE 0.9 % IV SOLN
1.0000 g | INTRAVENOUS | Status: DC
  Filled 2021-09-08: qty 10

## 2021-09-08 MED ORDER — FENTANYL CITRATE (PF) 100 MCG/2ML IJ SOLN
INTRAMUSCULAR | Status: AC | PRN
  Administered 2021-09-08: 25 ug via INTRAVENOUS

## 2021-09-08 MED ORDER — CEFTRIAXONE SODIUM 1 G IJ SOLR: 1.0000 g | INTRAMUSCULAR | Status: DC

## 2021-09-08 MED ORDER — ALTEPLASE 2 MG IJ SOLR: 2.0000 mg | Freq: Once | INTRAMUSCULAR | Status: DC | PRN

## 2021-09-08 MED ORDER — FENTANYL CITRATE (PF) 100 MCG/2ML IJ SOLN
INTRAMUSCULAR | Status: AC
  Filled 2021-09-08: qty 2

## 2021-09-08 MED ORDER — METHYLPREDNISOLONE SODIUM SUCC 125 MG IJ SOLR
120.0000 mg | Freq: Every day | INTRAMUSCULAR | Status: DC
  Administered 2021-09-08: 120 mg via INTRAVENOUS
  Filled 2021-09-08: qty 2

## 2021-09-08 MED ORDER — PANTOPRAZOLE SODIUM 40 MG PO TBEC
40.0000 mg | DELAYED_RELEASE_TABLET | Freq: Every day | ORAL | Status: DC
  Administered 2021-09-08 – 2021-09-11 (×4): 40 mg via ORAL
  Filled 2021-09-08 (×4): qty 1

## 2021-09-08 MED ORDER — MOMETASONE FURO-FORMOTEROL FUM 200-5 MCG/ACT IN AERO
2.0000 | INHALATION_SPRAY | Freq: Two times a day (BID) | RESPIRATORY_TRACT | Status: DC
  Filled 2021-09-08: qty 8.8

## 2021-09-08 MED ORDER — CARVEDILOL 3.125 MG PO TABS
3.1250 mg | ORAL_TABLET | Freq: Two times a day (BID) | ORAL | Status: DC
  Administered 2021-09-08 – 2021-09-11 (×6): 3.125 mg via ORAL
  Filled 2021-09-08 (×7): qty 1

## 2021-09-08 MED ORDER — METHYLPREDNISOLONE SODIUM SUCC 125 MG IJ SOLR
80.0000 mg | Freq: Every day | INTRAMUSCULAR | Status: DC
  Administered 2021-09-09: 80 mg via INTRAVENOUS
  Filled 2021-09-08: qty 2

## 2021-09-08 MED ORDER — LORATADINE 10 MG PO TABS
10.0000 mg | ORAL_TABLET | Freq: Every day | ORAL | Status: DC
  Administered 2021-09-08 – 2021-09-11 (×4): 10 mg via ORAL
  Filled 2021-09-08 (×4): qty 1

## 2021-09-08 NOTE — Assessment & Plan Note (Addendum)
Volume overload and acute pulmonary edema due to ESRD. ?Her volume status has improved after renal replacement therapy and ultraafiltration. ? ?Access  IJ HD cathter on the right IJ ?05/04 successful declotting left upper extremity fistula.  ?Plan to continue renal replacement therapy tomorrow at the outpatient unit.   ?

## 2021-09-08 NOTE — ED Notes (Signed)
Paged Nephrology for RN ?

## 2021-09-08 NOTE — Assessment & Plan Note (Signed)
-   Continue Plaquenil 

## 2021-09-08 NOTE — Assessment & Plan Note (Addendum)
Respiratory failure has improved. ?Follow up VBG with pH 7,39 and Pco2 46,9 ? ?Patient out of bed and ambulating ?Continue oxymetry monitoring.  ?Her oxymetry today is 100% on room air.  ?

## 2021-09-08 NOTE — Procedures (Signed)
? ?  I was present at this dialysis session, have reviewed the session itself and made  appropriate changes ?[name] MD ?[employer] ?pager [phone]   ?09/08/2021, 4:31 PM ? ? ?

## 2021-09-08 NOTE — Progress Notes (Signed)
? ? ?Referring Physician(s): ?Dr Mickel Crow ? ?Supervising Physician: Corrie Mckusick ? ?Patient Status:  [facility] - In-pt ? ?Chief Complaint: ? ?Clotted LUE dialysis fistula ? ?Subjective: ? ?Pt was seen in IR today---had tunneled cath placed 5/2 ?Procedure: Placement of a right IJ approach tunneled HD catheter. 19cm tip to cuff.  ?Tip is positioned at the superior cavoatrial junction and catheter is ready for immediate use.  ? ?To be scheduled 5/3 for declot in IR ? ?She is aware of procedure and agreeable ? ?Allergies: ?Patient has no known allergies. ? ?Medications: ?Prior to Admission medications   ?Medication Sig Start Date End Date Taking? Authorizing Provider  ?acetaminophen (TYLENOL) 325 MG tablet Take 650 mg by mouth every 6 (six) hours as needed for pain.   Yes [provider]  ?amLODipine (NORVASC) 10 MG tablet Take 1 tablet (10 mg total) by mouth daily. 06/04/21  Yes Geradine Girt, DO  ?carvedilol (COREG) 3.125 MG tablet Take 1 tablet (3.125 mg total) by mouth 2 (two) times daily with a meal. 06/04/21  Yes Geradine Girt, DO  ?ceFAZolin (ANCEF) 2-4 GM/100ML-% IVPB Inject 100 mLs (2 g total) into the vein Every Tuesday,Thursday,and Saturday with dialysis. 06/06/21  Yes Geradine Girt, DO  ?hydroxychloroquine (PLAQUENIL) 200 MG tablet Take 200 mg by mouth daily. 09/06/21  Yes [provider]  ?multivitamin (RENA-VIT) TABS tablet Take 1 tablet by mouth daily. 08/21/21  Yes [provider]  ?sevelamer carbonate (RENVELA) 800 MG tablet Take 800-2,400 mg by mouth See admin instructions. 2400 mg with meals ?800 mg with snacks 08/28/21  Yes [provider]  ? ? ? ?Vital Signs: ?BP (!) 151/95   Pulse 82   Temp 97.8 ?F (36.6 ?C) (Temporal)   Resp 17   Wt 113 lb 1.5 oz (51.3 kg)   SpO2 100%   BMI 19.41 kg/m?  ? ?Physical Exam ?Vitals reviewed.  ?Cardiovascular:  ?   Rate and Rhythm: Normal rate and regular rhythm.  ?Musculoskeletal:  ?   Comments: LUE fistula clotted ?No pulse; no  thrill ?  ?Skin: ?   General: Skin is warm.  ?Neurological:  ?   Mental Status: She is alert and oriented to person, place, and time.  ?Psychiatric:     ?   Behavior: Behavior normal.  ? ? ?Imaging: ?IR Fluoro Guide CV Line Right ? ?Result Date: 09/08/2021 ?INDICATION: 61 year old female referred for hemodialysis catheter for urgent dialysis EXAM: TUNNELED CENTRAL VENOUS HEMODIALYSIS CATHETER PLACEMENT WITH ULTRASOUND AND FLUOROSCOPIC GUIDANCE MEDICATIONS: 1 g Ancef. The antibiotic was given in an appropriate time interval prior to skin puncture. ANESTHESIA/SEDATION: Moderate (conscious) sedation was employed during this procedure. A total of Versed 1.0 mg and Fentanyl 25 mcg was administered intravenously by the radiology nurse. Total intra-service moderate Sedation Time: 19 minutes. The patient's level of consciousness and vital signs were monitored continuously by radiology nursing throughout the procedure under my direct supervision. FLUOROSCOPY: Radiation Exposure Index (as provided by the fluoroscopic device): 76.2 mGy Kerma COMPLICATIONS: None PROCEDURE: Informed written consent was obtained from the patient after a discussion of the risks, benefits, and alternatives to treatment. Questions regarding the procedure were encouraged and answered. The right neck and chest were prepped with chlorhexidine in a sterile fashion, and a sterile drape was applied covering the operative field. Maximum barrier sterile technique with sterile gowns and gloves were used for the procedure. A timeout was performed prior to the initiation of the procedure. Ultrasound survey was performed. The right  internal jugular vein was confirmed to be patent, with images stored and sent to PACS. Micropuncture kit was utilized to access the right internal jugular vein under direct, real-time ultrasound guidance after the overlying soft tissues were anesthetized with 1% lidocaine with epinephrine. Stab incision was made with 11 blade scalpel.  Microwire was passed centrally. The microwire was then marked to measure appropriate internal catheter length. External tunneled length was estimated. A total tip to cuff length of 19 cm was selected. 035 guidewire was advanced to the level of the IVC. Skin and subcutaneous tissues of chest wall below the clavicle were generously infiltrated with 1% lidocaine for local anesthesia. A small stab incision was made with 11 blade scalpel. The selected hemodialysis catheter was tunneled in a retrograde fashion from the anterior chest wall to the venotomy incision. Serial dilation was performed and then a peel-away sheath was placed. The catheter was then placed through the peel-away sheath with tips ultimately positioned within the superior aspect of the right atrium. Final catheter positioning was confirmed and documented with a spot radiographic image. The catheter aspirates and flushes normally. The catheter was flushed with appropriate volume heparin dwells. The catheter exit site was secured with a 0-Prolene retention suture. Gel-Foam slurry was infused into the soft tissue tract. The venotomy incision was closed Derma bond and sterile dressing. Dressings were applied at the chest wall. Patient tolerated the procedure well and remained hemodynamically stable throughout. No complications were encountered and no significant blood loss encountered. IMPRESSION: Status post right IJ tunneled hemodialysis catheter Signed, Dulcy Fanny. Dellia Nims, RPVI [facility] Radiology Electronically Signed   By: Corrie Mckusick D.O.   On: 09/08/2021 13:03  ? ?IR US Guide Vasc Access Right ? ?Result Date: 09/08/2021 ?INDICATION: 61 year old female referred for hemodialysis catheter for urgent dialysis EXAM: TUNNELED CENTRAL VENOUS HEMODIALYSIS CATHETER PLACEMENT WITH ULTRASOUND AND FLUOROSCOPIC GUIDANCE MEDICATIONS: 1 g Ancef. The antibiotic was given in an appropriate time interval prior to  skin puncture. ANESTHESIA/SEDATION: Moderate (conscious) sedation was employed during this procedure. A total of Versed 1.0 mg and Fentanyl 25 mcg was administered intravenously by the radiology nurse. Total intra-service moderate Sedation Time: 19 minutes. The patient's level of consciousness and vital signs were monitored continuously by radiology nursing throughout the procedure under my direct supervision. FLUOROSCOPY: Radiation Exposure Index (as provided by the fluoroscopic device): 86.7 mGy Kerma COMPLICATIONS: None PROCEDURE: Informed written consent was obtained from the patient after a discussion of the risks, benefits, and alternatives to treatment. Questions regarding the procedure were encouraged and answered. The right neck and chest were prepped with chlorhexidine in a sterile fashion, and a sterile drape was applied covering the operative field. Maximum barrier sterile technique with sterile gowns and gloves were used for the procedure. A timeout was performed prior to the initiation of the procedure. Ultrasound survey was performed. The right internal jugular vein was confirmed to be patent, with images stored and sent to PACS. Micropuncture kit was utilized to access the right internal jugular vein under direct, real-time ultrasound guidance after the overlying soft tissues were anesthetized with 1% lidocaine with epinephrine. Stab incision was made with 11 blade scalpel. Microwire was passed centrally. The microwire was then marked to measure appropriate internal catheter length. External tunneled length was estimated. A total tip to cuff length of 19 cm was selected. 035 guidewire was advanced to the level of the IVC. Skin and subcutaneous tissues of chest wall below the clavicle were generously infiltrated  with 1% lidocaine for local anesthesia. A small stab incision was made with 11 blade scalpel. The selected hemodialysis catheter was tunneled in a retrograde fashion from the anterior chest  wall to the venotomy incision. Serial dilation was performed and then a peel-away sheath was placed. The catheter was then placed through the peel-away sheath with tips ultimately positioned within the superior

## 2021-09-08 NOTE — Progress Notes (Signed)
Patient came to the floor alert and oriented. HD Tunneled catheter was bleeding, IR on call notified. Pressure hold on the surgical site. Bleeding stopped and dressing changed. Report given to night nurse. ?

## 2021-09-08 NOTE — Progress Notes (Signed)
I hae seen and assessed patient and I agree with Dr. Juleen China assessment and plan.  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  Patient noted to have been at a HD on Saturday and usually stays full-time however left 1 kg under her dry weight.  Patient does endorse eating ham at [employer] which she is attributing to her shortness of breath.  Currently off BiPAP and on 3 L nasal cannula.  Patient seen by nephrology and being set up for hemodialysis today.  Patient will need declotting of left upper extremity dialysis fistula versus temporary HD cath.  Continue treatment for COPD exacerbation.  Supportive care. ? ?No charge. ?

## 2021-09-08 NOTE — Assessment & Plan Note (Addendum)
Had S.Pneumo bacteremia back in jan in setting of PNA. ?Was on Ancef with stop date of 07/01/21 per DC summary in Jan. ?1. Not sure why this is still on her home med-rec. ?2. Getting rocephin anyhow for acute COPD exacerbation. ? ?

## 2021-09-08 NOTE — Progress Notes (Signed)
[facility] ?Progress Note ? ?Subjective: pt seen in ED.  Off of bipap now on 2L [place] O2. Getting nebs for COPD issues.  ? ?Vitals:  ? 09/08/21 0845 09/08/21 0915 09/08/21 0930 09/08/21 1000  ?BP: (!) 151/99 (!) 143/103 (!) 149/98 (!) 148/88  ?Pulse: 86 85 67 83  ?Resp: '16 20 19 17  '$ ?Temp:      ?TempSrc:      ?SpO2: 100% 100%  100%  ? ? ?Exam: ?  alert, nad  ? +jvd ? Chest poor air movement, occ crackles mostly clear ? Cor reg no RG ? Abd soft ntnd no ascites ?  Ext trace LE edema ?  Alert, NF, ox3 ? LUA AVG no bruit  ? ? ? ? OP HD: [facility] ?  3h 59mn  49.5kg  P2  350/1.5  2/2 bath  LUA AVG Hep 3600  ?- mircera 200 q2, last 4/22 ?- sensipar 60 mg tiw ?- last Hb 10.0 on 4/27 ? ?   CXR 5/01 - IMPRESSION: Mild cardiomegaly and mild right greater than left interstitial opacity, which may indicate mild pulmonary edema. ?  ? ?Assessment/ Plan: ?Acute hypoxic and hypercarbic RF - improved w/ bipap, Mg, solumedrol, inhalers in ED. CXR showed bilat pulm edema. Will need HD today once access is established.  ?Clotted LUA AVG - IR consulting for declot and/or temp cath as needed ?ESRD - on HD TTS.  HD later today is the plan.  ?HTN/ vol - came off under dry wt 1kg last session. Max UF w/ HD here.  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 ? ? ? ?Rob SDoctor, hospital?09/08/2021, 11:17 AM ? ? ?Recent Labs  ?Lab 09/07/21 ?1955 09/07/21 ?2008 09/07/21 ?2243 09/08/21 ?0500  ?HGB 10.7*   < > 11.9* 11.0*  ?ALBUMIN 3.1*  --   --   --   ?CALCIUM 8.8*  --   --  8.8*  ?CREATININE 9.32*  --   --  9.74*  ?K 4.7   < > 3.8 3.9  ? < > = values in this interval not displayed.  ? ?Inpatient medications: ? amLODipine  10 mg Oral Daily  ? budesonide (PULMICORT) nebulizer solution  0.25 mg Nebulization BID  ? carvedilol  3.125 mg Oral BID WC  ? Chlorhexidine Gluconate Cloth  6 each Topical Q0600  ? enalaprilat  0.625 mg Intravenous Once  ? fluticasone  2  spray Each Nare Daily  ? hydroxychloroquine  200 mg Oral Daily  ? ipratropium-albuterol  3 mL Nebulization TID  ? loratadine  10 mg Oral Daily  ? methylPREDNISolone (SOLU-MEDROL) injection  120 mg Intravenous Daily  ? multivitamin  1 tablet Oral Daily  ? pantoprazole  40 mg Oral Q0600  ? sevelamer carbonate  2,400 mg Oral TID WC  ? ? sodium chloride    ? sodium chloride    ? cefTRIAXone (ROCEPHIN)  IV Stopped (09/08/21 1005)  ? ?sodium chloride, sodium chloride, albuterol, alteplase, heparin, lidocaine (PF), lidocaine-prilocaine, pentafluoroprop-tetrafluoroeth, sevelamer carbonate ? ? ? ? ? ? ?

## 2021-09-08 NOTE — H&P (Addendum)
?History and Physical  ? ? ?Patient: Kathleen [name] ACZ:[id number] DOB: 11/27/1960 ?DOA: 09/07/2021 ?DOS: the patient was seen and examined on 09/08/2021 ?PCP: Angela Burke, NP  ?Patient coming from: Home ? ?Chief Complaint:  ?Chief Complaint  ?Patient presents with  ? Respiratory Distress  ? ?HPI: Yania Bogie is a 61 y.o. 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 ? ?Pt presents to ED with onset of respiratory distress this evening. ? ?Pt did last HD on Sat, typically stays her full time, left 1kg under her dry wt. ? ?However doesn't sound like she was necessarily adherent to her fluid restriction over the past day or so at home.  Sounds like she drank a large volume of sprite per family. ? ?In ED stabilized with BIPAP, given ativan. ? ?Review of Systems: As mentioned in the history of present illness. All other systems reviewed and are negative. ?Past Medical History:  ?Diagnosis Date  ? Anemia   ? Arthritis   ? Cardiac arrest [facility])   ? Cellulitis   ? Cerebral vasculitis   ? COPD (chronic obstructive pulmonary disease) ([place])   ? Elevated antinuclear antibody (ANA) level   ? Endometriosis   ? ESRD (end stage renal disease) on dialysis [facility])   ? Gallstones   ? Hypertension   ? Lupus ([place])   ? Migraine   ? PONV (postoperative nausea and vomiting)   ? Seizures ([place])   ? Sepsis ([place])   ? Sleep apnea   ? Suicide attempt [facility])   ? TB (tuberculosis)   ? Transient ischemic attack (TIA)   ? Vasculitis of skin   ? ?Past Surgical History:  ?Procedure Laterality Date  ? AV FISTULA PLACEMENT Left 07/04/2017  ? Procedure: ARTERIOVENOUS (AV) FISTULA CREATION;  Surgeon: Conrad Corsica, MD;  Location: [place];  Service: Vascular;  Laterality: Left;  ? AV FISTULA PLACEMENT Left 11/27/2019  ? Procedure: INSERTION OF ARTERIOVENOUS (AV) GORE-TEX GRAFT LEFT UPPER ARM;  Surgeon: Angelia Mould, MD;  Location: [facility] OR;   Service: Vascular;  Laterality: Left;  ? AV FISTULA PLACEMENT Left 07/29/2020  ? Procedure: INSERTION OF LEFT UPPER ARM ARTERIOVENOUS (AV) GORE-TEX GRAFT;  Surgeon: Angelia Mould, MD;  Location: [place];  Service: Vascular;  Laterality: Left;  ? BUBBLE STUDY  05/25/2021  ? Procedure: BUBBLE STUDY;  Surgeon: Geralynn Rile, MD;  Location: [place];  Service: Cardiovascular;;  ? CESAREAN SECTION    ? ESOPHAGOGASTRODUODENOSCOPY N/A 07/15/2016  ? Procedure: ESOPHAGOGASTRODUODENOSCOPY (EGD);  Surgeon: Laurence Spates, MD;  Location: [facility] ENDOSCOPY;  Service: Endoscopy;  Laterality: N/A;  ? I & D EXTREMITY Left 05/27/2021  ? Procedure: IRRIGATION AND DEBRIDEMENT OF WRIST;  Surgeon: Iran Planas, MD;  Location: [place];  Service: Orthopedics;  Laterality: Left;  ? INSERTION OF DIALYSIS CATHETER Right 07/04/2017  ? Procedure: INSERTION OF TUNNELED  DIALYSIS CATHETER - RIGHT INTERNAL JUGULAR PLACEMENT;  Surgeon: Conrad Elberta, MD;  Location: [place];  Service: Vascular;  Laterality: Right;  ? LUNG BIOPSY    ? TEE WITHOUT CARDIOVERSION N/A 05/25/2021  ? Procedure: TRANSESOPHAGEAL ECHOCARDIOGRAM (TEE);  Surgeon: Geralynn Rile, MD;  Location: [place];  Service: Cardiovascular;  Laterality: N/A;  ? TUBAL LIGATION    ? ?Social History:  reports that she has been smoking cigarettes. She has been smoking an average of .25 packs per day. She has never used  smokeless tobacco. She reports that she does not drink alcohol and does not use drugs. ? ?No Known Allergies ? ?Family History  ?Problem Relation Age of Onset  ? Ovarian cancer Mother   ? Breast cancer Sister   ? Throat cancer Brother   ? ? ?Prior to Admission medications   ?Medication Sig Start Date End Date Taking? Authorizing Provider  ?acetaminophen (TYLENOL) 325 MG tablet Take 650 mg by mouth every 6 (six) hours as needed for pain.    [provider]  ?amLODipine (NORVASC) 10 MG tablet Take 1 tablet (10 mg total) by mouth daily. 06/04/21   Geradine Girt, DO  ?carvedilol (COREG) 3.125 MG tablet Take 1 tablet (3.125 mg total) by mouth 2 (two) times daily with a meal. 06/04/21   Geradine Girt, DO  ?ceFAZolin (ANCEF) 2-4 GM/100ML-% IVPB Inject 100 mLs (2 g total) into the vein Every Tuesday,Thursday,and Saturday with dialysis. 06/06/21   Geradine Girt, DO  ?famotidine (PEPCID) 10 MG tablet Take 1 tablet (10 mg total) by mouth daily. 06/04/21   Geradine Girt, DO  ?Oxycodone HCl 10 MG TABS Take 1 tablet (10 mg total) by mouth every 6 (six) hours as needed for moderate pain. 06/04/21   Geradine Girt, DO  ?predniSONE (DELTASONE) 5 MG tablet Take 3 tablets (15 mg total) by mouth daily with breakfast. 06/04/21   Geradine Girt, DO  ? ? ?Physical Exam: ?Vitals:  ? 09/08/21 0100 09/08/21 0115 09/08/21 0130 09/08/21 0145  ?BP: (!) 149/93 132/86 131/86 (!) 141/91  ?Pulse:      ?Resp:  16 17 15   ?SpO2:      ? ?Constitutional: NAD, calm, comfortable ?Eyes: PERRL, lids and conjunctivae normal ?ENMT: Mucous membranes are moist. Posterior pharynx clear of any exudate or lesions.Normal dentition.  ?Neck: normal, supple, no masses, no thyromegaly, JVD present ?Respiratory: Diffuse wheezing ?Cardiovascular: Regular rate and rhythm, no murmurs / rubs / gallops. 1+ extremity edema. 2+ pedal pulses. No carotid bruits.  ?Abdomen: no tenderness, no masses palpated. No hepatosplenomegaly. Bowel sounds positive.  ?Musculoskeletal: no clubbing / cyanosis. No joint deformity upper and lower extremities. Good ROM, no contractures. Normal muscle tone.  ?Skin: no rashes, lesions, ulcers. No induration ?Neurologic: CN 2-12 grossly intact. Sensation intact, DTR normal. Strength 5/5 in all 4.  ?Psychiatric: Normal judgment and insight. Alert and oriented x 3. Normal mood.  ? ?Data Reviewed: ? ?BNP 3000 ? ?Initial VBG with pH 7.17, pCO2 68.2 ?After BIPAP pH 7.39, pCO2 46.9 ? ?CXR with possible mild pulm edema. ? ?WBC 15.8k ? ?Assessment and Plan: ?* Acute respiratory failure with hypoxia  and hypercapnia (HCC) ?Due to a combination of volume overload (CHF) and COPD likely also playing a role ?DDx also includes PNA (though no fever, does have WBC 15k) and Lupus Pneumonitis (though no history of SLE lung dz previously, no fever, and no inspiratory crackles) ?Volume management as per nephrology (see note) ?Pt requiring rescue BIPAP to maintain sats and CO2 clearance, wean as able. ?COPD treatment as below. ?Check ESR, CRP, and procalcitonin ?Check COVID and Flu ? ?Patient has acute respiratory failure with hypoxia due to having a new oxygen requirement.  That is the patient has a PaO2 < 60 (pulse Ox < 90%) on room air. ? ?Patient has acute respiratory failure with hypercapnea.  Patient has a pH < 7.35 and pCO2 > 50 ? ? ?Acute on chronic combined systolic and diastolic CHF (congestive heart failure) ([place]) ?Do think CHF  is playing a role: BNP 3k, pulm edema on CXR, JVD on exam. ?EF 45-50% ?Volume management per nephro ? ?COPD with acute exacerbation ([place]) ?COPD pathway ?Rocephin ?Prednisone (got solumedrol in ED) ?PRN SABA ?Scheduled LABA, LAMA, and inhaled steroid. ? ?ESRD (end stage renal disease) ([place]) ?In setting of SLE. ?Dialysis per nephrology, see note ? ?History of bacteremia ?Had S.Pneumo bacteremia back in jan in setting of PNA. ?Was on Ancef with stop date of 07/01/21 per DC summary in Jan. ?Not sure why this is still on her home med-rec. ?Getting rocephin anyhow for acute COPD exacerbation. ? ? ?Hypertension ?On NTG GTT initially for CHF. ?Resume home BP meds when med rec is completed. ? ?Lupus ([place]) ?Continue Plaquenil ? ? ? ? ? Advance Care Planning:   Code Status: Full Code ? ?Consults: Nephro ? ?Family Communication: No family in room ? ?Severity of Illness: ?The appropriate patient status for this patient is OBSERVATION. Observation status is judged to be reasonable and necessary in order to provide the required intensity of service to ensure the patient's safety. The patient's presenting  symptoms, physical exam findings, and initial radiographic and laboratory data in the context of their medical condition is felt to place them at decreased risk for further clinical deterioration. Furtherm

## 2021-09-08 NOTE — Consult Note (Signed)
? ?Chief Complaint: ?Patient was seen in consultation today for left arm dialysis fistula evaluation possible intervention ?Chief Complaint  ?Patient presents with  ? Respiratory Distress  ? at the request of Dr Mickel Crow ? ?Supervising Physician: Corrie Mckusick ? ?Patient Status: [facility] - ED ? ?History of Present Illness: ?Kathleen [name] is a 61 y.o. female  ? ?HTN; COPD; Lupus; Seizure Hx;  ?In ED since 5/1 with SOB; AMS ?ESRD ?Last dialysis Saturday ?Went well-- no issues ?Has been evaluated by Dr Hollie Salk yesterday-- and Dr Jonnie Finner today ?Normalizing; mental status now normal ?Off Bipap--- now 3L [place] ? ?Exam revealing clotted LUA dialysis fistula  ?Request for urgent dialysis per Nephrology ?Dr Earleen Newport aware and agreeable ? ? ?Past Medical History:  ?Diagnosis Date  ? Anemia   ? Arthritis   ? Cardiac arrest [facility])   ? Cellulitis   ? Cerebral vasculitis   ? COPD (chronic obstructive pulmonary disease) ([place])   ? Elevated antinuclear antibody (ANA) level   ? Endometriosis   ? ESRD (end stage renal disease) on dialysis [facility])   ? Gallstones   ? Hypertension   ? Lupus ([place])   ? Migraine   ? PONV (postoperative nausea and vomiting)   ? Seizures ([place])   ? Sepsis ([place])   ? Sleep apnea   ? Suicide attempt [facility])   ? TB (tuberculosis)   ? Transient ischemic attack (TIA)   ? Vasculitis of skin   ? ? ?Past Surgical History:  ?Procedure Laterality Date  ? AV FISTULA PLACEMENT Left 07/04/2017  ? Procedure: ARTERIOVENOUS (AV) FISTULA CREATION;  Surgeon: Conrad Toccoa, MD;  Location: [place];  Service: Vascular;  Laterality: Left;  ? AV FISTULA PLACEMENT Left 11/27/2019  ? Procedure: INSERTION OF ARTERIOVENOUS (AV) GORE-TEX GRAFT LEFT UPPER ARM;  Surgeon: Angelia Mould, MD;  Location: [facility] OR;  Service: Vascular;  Laterality: Left;  ? AV FISTULA PLACEMENT Left 07/29/2020  ? Procedure: INSERTION OF LEFT UPPER ARM ARTERIOVENOUS (AV) GORE-TEX GRAFT;  Surgeon: Angelia Mould, MD;  Location: [place];  Service: Vascular;   Laterality: Left;  ? BUBBLE STUDY  05/25/2021  ? Procedure: BUBBLE STUDY;  Surgeon: Geralynn Rile, MD;  Location: [place];  Service: Cardiovascular;;  ? CESAREAN SECTION    ? ESOPHAGOGASTRODUODENOSCOPY N/A 07/15/2016  ? Procedure: ESOPHAGOGASTRODUODENOSCOPY (EGD);  Surgeon: Laurence Spates, MD;  Location: [facility] ENDOSCOPY;  Service: Endoscopy;  Laterality: N/A;  ? I & D EXTREMITY Left 05/27/2021  ? Procedure: IRRIGATION AND DEBRIDEMENT OF WRIST;  Surgeon: Iran Planas, MD;  Location: [place];  Service: Orthopedics;  Laterality: Left;  ? INSERTION OF DIALYSIS CATHETER Right 07/04/2017  ? Procedure: INSERTION OF TUNNELED  DIALYSIS CATHETER - RIGHT INTERNAL JUGULAR PLACEMENT;  Surgeon: Conrad [place], MD;  Location: [place];  Service: Vascular;  Laterality: Right;  ? LUNG BIOPSY    ? TEE WITHOUT CARDIOVERSION N/A 05/25/2021  ? Procedure: TRANSESOPHAGEAL ECHOCARDIOGRAM (TEE);  Surgeon: Geralynn Rile, MD;  Location: [place];  Service: Cardiovascular;  Laterality: N/A;  ? TUBAL LIGATION    ? ? ?Allergies: ?Patient has no known allergies. ? ?Medications: ?Prior to Admission medications   ?Medication Sig Start Date End Date Taking? Authorizing Provider  ?acetaminophen (TYLENOL) 325 MG tablet Take 650 mg by mouth every 6 (six) hours as needed for pain.   Yes [provider]  ?amLODipine (NORVASC) 10 MG tablet Take 1 tablet (10 mg total) by mouth daily. 06/04/21  Yes Geradine Girt, DO  ?  carvedilol (COREG) 3.125 MG tablet Take 1 tablet (3.125 mg total) by mouth 2 (two) times daily with a meal. 06/04/21  Yes Geradine Girt, DO  ?ceFAZolin (ANCEF) 2-4 GM/100ML-% IVPB Inject 100 mLs (2 g total) into the vein Every Tuesday,Thursday,and Saturday with dialysis. 06/06/21  Yes Geradine Girt, DO  ?hydroxychloroquine (PLAQUENIL) 200 MG tablet Take 200 mg by mouth daily. 09/06/21  Yes [provider]  ?multivitamin (RENA-VIT) TABS tablet Take 1 tablet by mouth daily. 08/21/21  Yes [provider]   ?sevelamer carbonate (RENVELA) 800 MG tablet Take 800-2,400 mg by mouth See admin instructions. 2400 mg with meals ?800 mg with snacks 08/28/21  Yes [provider]  ?  ? ?Family History  ?Problem Relation Age of Onset  ? Ovarian cancer Mother   ? Breast cancer Sister   ? Throat cancer Brother   ? ? ?Social History  ? ?Socioeconomic History  ? Marital status: Significant Other  ?  Spouse name: Not on file  ? Number of children: Not on file  ? Years of education: Not on file  ? Highest education level: Not on file  ?Occupational History  ? Not on file  ?Tobacco Use  ? Smoking status: Some Days  ?  Packs/day: 0.25  ?  Types: Cigarettes  ? Smokeless tobacco: Never  ?Vaping Use  ? Vaping Use: Never used  ?Substance and Sexual Activity  ? Alcohol use: No  ? Drug use: No  ? Sexual activity: Not Currently  ?  Birth control/protection: Post-menopausal  ?Other Topics Concern  ? Not on file  ?Social History Narrative  ? Not on file  ? ?Social Determinants of Health  ? ?Financial Resource Strain: Not on file  ?Food Insecurity: Not on file  ?Transportation Needs: Not on file  ?Physical Activity: Not on file  ?Stress: Not on file  ?Social Connections: Not on file  ? ? ?Review of Systems: A 12 point ROS discussed and pertinent positives are indicated in the HPI above.  All other systems are negative. ? ?Review of Systems  ?Constitutional:  Positive for activity change and fatigue. Negative for fever.  ?Respiratory:  Positive for shortness of breath and wheezing. Negative for cough.   ?Cardiovascular:  Negative for chest pain.  ?Gastrointestinal:  Negative for abdominal pain.  ?Neurological:  Positive for weakness.  ?Psychiatric/Behavioral:  Negative for behavioral problems and confusion.   ? ?Vital Signs: ?BP (!) 143/103   Pulse 85   Temp (!) 97.4 ?F (36.3 ?C) (Oral)   Resp 20   SpO2 100%  ? ?Physical Exam ?Vitals reviewed.  ?HENT:  ?   Mouth/Throat:  ?   Mouth: Mucous membranes are moist.  ?Cardiovascular:  ?   Rate  and Rhythm: Normal rate and regular rhythm.  ?   Heart sounds: Normal heart sounds.  ?Pulmonary:  ?   Effort: Pulmonary effort is normal.  ?   Breath sounds: Wheezing present.  ?Abdominal:  ?   Palpations: Abdomen is soft.  ?Musculoskeletal:     ?   General: Normal range of motion.  ?Skin: ?   General: Skin is warm.  ?Neurological:  ?   Mental Status: She is alert and oriented to person, place, and time.  ?Psychiatric:     ?   Behavior: Behavior normal.     ?   Thought Content: Thought content normal.  ? ? ?Imaging: ?DG Chest Port 1 View ? ?Result Date: 09/07/2021 ?CLINICAL DATA:  Shortness of breath EXAM: PORTABLE  CHEST 1 VIEW COMPARISON:  07/04/2021 FINDINGS: Mild cardiomegaly. Mild right greater than left interstitial opacity. No pneumothorax or sizable pleural effusion. No focal airspace consolidation. IMPRESSION: Mild cardiomegaly and mild right greater than left interstitial opacity, which may indicate mild pulmonary edema. Electronically Signed   By: Ulyses Jarred M.D.   On: 09/07/2021 20:22   ? ?Labs: ? ?CBC: ?Recent Labs  ?  06/11/21 ?1336 06/25/21 ?1405 09/07/21 ?1955 09/07/21 ?2008 09/07/21 ?2243 09/08/21 ?0500  ?WBC 6.4 6.6 15.8*  --   --  5.3  ?HGB 8.1* 7.1* 10.7* 12.9 11.9* 11.0*  ?HCT 26.5* 23.7* 39.0 38.0 35.0* 38.6  ?PLT 246 284 295  --   --  172  ? ? ?COAGS: ?No results for input(s): INR, APTT in the last 8760 hours. ? ?BMP: ?Recent Labs  ?  06/11/21 ?1336 06/25/21 ?1405 09/07/21 ?1955 09/07/21 ?2008 09/07/21 ?2243 09/08/21 ?0500  ?NA 138 139 141 139 139 138  ?K 3.5 3.3* 4.7 4.7 3.8 3.9  ?CL 91* 94* 105  --   --  101  ?CO2 33* 33* 21*  --   --  23  ?GLUCOSE 82 88 219*  --   --  124*  ?BUN 11 7 23*  --   --  26*  ?CALCIUM 7.9* 8.1* 8.8*  --   --  8.8*  ?CREATININE 3.84* 3.90* 9.32*  --   --  9.74*  ?GFRNONAA 13* 13* 4*  --   --  4*  ? ? ?LIVER FUNCTION TESTS: ?Recent Labs  ?  06/04/21 ?0103 06/11/21 ?1336 06/25/21 ?1405 09/07/21 ?1955  ?BILITOT 0.3 0.3 0.2* 0.7  ?AST '19 20 20 '$ 51*  ?ALT <5 <5 <5 14   ?ALKPHOS 32 20 25 42  ?PROT 6.3* 7.4 7.0 7.0  ?ALBUMIN 2.2* 2.6* 2.6* 3.1*  ? ? ?TUMOR MARKERS: ?No results for input(s): AFPTM, CEA, CA199, CHROMGRNA in the last 8760 hours. ? ?Assessment and Plan: ? ?Clotted left a

## 2021-09-08 NOTE — Assessment & Plan Note (Addendum)
Blood pressure improved, plan to continue with amlodipine, and carvedilol  ?Continue with hydralazine 50 mg po bid. ?Continue ultrafiltration on HD  ? ?Blood pressure at discharge has improved.  ?

## 2021-09-08 NOTE — Progress Notes (Signed)
PT Cancellation Note ? ?Patient Details ?Name: Kathleen [name] ?MRN: [id number] ?DOB: August 11, 1960 ? ? ?Cancelled Treatment:    Reason Eval/Treat Not Completed: Other (comment);Patient at procedure or test/unavailable. Mult procedural conflicts, getting treated in IR for her clotted HD cath.  Follow up at another time. ? ? ?Ramond Dial ?09/08/2021, 11:17 AM ? ?Mee Hives, PT PhD ?Acute Rehab Dept. Number: [facility] [phone] and [place] [phone] ? ?

## 2021-09-08 NOTE — Assessment & Plan Note (Addendum)
Preserved LV systolic function, echocardiogram with LV systolic function 60 to 48% with preserved RV systolic function. Moderate dilatation of the aortic root.  ? ?Continue carvedilol, and blood pressure control with amlodipine and hydralazine.  ? ?Ultrafiltration per HD.  ? ?

## 2021-09-08 NOTE — Progress Notes (Addendum)
Pt receives out-pt HD at [facility] on TTS. Contacted clinic to inquire about pt's arrival/start time and awaiting response. Will assist as needed.  ? ?Melven Sartorius ?Renal Navigator ?[phone] ? ?Addendum at 3:04 pm: ?Pt's chair time is 11:20. ? ? ?

## 2021-09-08 NOTE — Assessment & Plan Note (Addendum)
On bronchodilator therapy. ? ?She is out of bed and ambulating, continue oxymetry monitoring ? ?No clinical signs of pneumonia, will discontinue antibiotic therapy.  ?

## 2021-09-08 NOTE — Procedures (Signed)
Interventional Radiology Procedure Note ? ?Procedure: Placement of a right IJ approach tunneled HD catheter. 19cm tip to cuff.  ?Tip is positioned at the superior cavoatrial junction and catheter is ready for immediate use.  ?Complications: None ?Recommendations:  ?- Ok to use ?- Do not submerge ?- Routine line care  ?- VIR to reassess for declot attempt ? ?Signed, ? ?Dulcy Fanny. Earleen Newport, DO ? ? ?

## 2021-09-08 NOTE — ED Notes (Signed)
Conversed w/ the respiratory therapist about trialing pt off of BiPAP. Pt tolerating 3L [place] ?

## 2021-09-09 ENCOUNTER — Inpatient Hospital Stay (HOSPITAL_COMMUNITY): Payer: Medicare Other

## 2021-09-09 DIAGNOSIS — J441 Chronic obstructive pulmonary disease with (acute) exacerbation: Secondary | ICD-10-CM | POA: Diagnosis not present

## 2021-09-09 DIAGNOSIS — N186 End stage renal disease: Secondary | ICD-10-CM | POA: Diagnosis not present

## 2021-09-09 DIAGNOSIS — I5043 Acute on chronic combined systolic (congestive) and diastolic (congestive) heart failure: Secondary | ICD-10-CM | POA: Diagnosis not present

## 2021-09-09 DIAGNOSIS — J9601 Acute respiratory failure with hypoxia: Secondary | ICD-10-CM | POA: Diagnosis not present

## 2021-09-09 DIAGNOSIS — M329 Systemic lupus erythematosus, unspecified: Secondary | ICD-10-CM

## 2021-09-09 LAB — RENAL FUNCTION PANEL
Albumin: 3 g/dL — ABNORMAL LOW (ref 3.5–5.0)
Anion gap: 11 (ref 5–15)
BUN: 31 mg/dL — ABNORMAL HIGH (ref 6–20)
CO2: 26 mmol/L (ref 22–32)
Calcium: 8.9 mg/dL (ref 8.9–10.3)
Chloride: 98 mmol/L (ref 98–111)
Creatinine, Ser: 6.09 mg/dL — ABNORMAL HIGH (ref 0.44–1.00)
GFR, Estimated: 7 mL/min — ABNORMAL LOW (ref >60)
Glucose, Bld: 100 mg/dL — ABNORMAL HIGH (ref 70–99)
Phosphorus: 3.1 mg/dL (ref 2.5–4.6)
Potassium: 4.9 mmol/L (ref 3.5–5.1)
Sodium: 135 mmol/L (ref 135–145)

## 2021-09-09 LAB — CBC WITH DIFFERENTIAL/PLATELET
Abs Immature Granulocytes: 0.04 10*3/uL (ref 0.00–0.07)
Basophils Absolute: 0 10*3/uL (ref 0.0–0.1)
Basophils Relative: 0 %
Eosinophils Absolute: 0 10*3/uL (ref 0.0–0.5)
Eosinophils Relative: 0 %
HCT: 35 % — ABNORMAL LOW (ref 36.0–46.0)
Hemoglobin: 10.5 g/dL — ABNORMAL LOW (ref 12.0–15.0)
Immature Granulocytes: 0 %
Lymphocytes Relative: 9 %
Lymphs Abs: 0.9 10*3/uL (ref 0.7–4.0)
MCH: 27.1 pg (ref 26.0–34.0)
MCHC: 30 g/dL (ref 30.0–36.0)
MCV: 90.2 fL (ref 80.0–100.0)
Monocytes Absolute: 0.9 10*3/uL (ref 0.1–1.0)
Monocytes Relative: 10 %
Neutro Abs: 8 10*3/uL — ABNORMAL HIGH (ref 1.7–7.7)
Neutrophils Relative %: 81 %
Platelets: 185 10*3/uL (ref 150–400)
RBC: 3.88 MIL/uL (ref 3.87–5.11)
RDW: 17.6 % — ABNORMAL HIGH (ref 11.5–15.5)
WBC: 9.8 10*3/uL (ref 4.0–10.5)
nRBC: 0 % (ref 0.0–0.2)

## 2021-09-09 LAB — HEPATITIS B SURFACE ANTIBODY, QUANTITATIVE: Hep B S AB Quant (Post): <3.1 m[IU]/mL — ABNORMAL LOW (ref >9.9)

## 2021-09-09 MED ORDER — ENSURE ENLIVE PO LIQD
237.0000 mL | Freq: Two times a day (BID) | ORAL | Status: DC
  Administered 2021-09-09 – 2021-09-11 (×4): 237 mL via ORAL

## 2021-09-09 MED ORDER — SALINE SPRAY 0.65 % NA SOLN
1.0000 | NASAL | Status: DC | PRN
Start: 2021-09-09 — End: 2021-09-11
  Filled 2021-09-09: qty 44

## 2021-09-09 NOTE — Progress Notes (Signed)
?[facility] ?Progress Note  ? ?Subjective:  Seen in room. Tunneled catheter placed by IR. Had dialysis yesterday. Breathing improved, on RA. Came in with clotted graft, says she won't have declot today because she ate.  ? ?Objective ?Vitals:  ? 09/09/21 0034 09/09/21 0424 09/09/21 4481 09/09/21 0901  ?BP:  (!) 156/110 (!) 148/123   ?Pulse: 88 88 78   ?Resp:  18 18   ?Temp:  98.3 ?F (36.8 ?C) 98 ?F (36.7 ?C)   ?TempSrc:  Oral Oral   ?SpO2: 100% 100% 95% 98%  ?Weight: 50.3 kg     ? ? ?Additional Objective ?Labs: ?Basic Metabolic Panel: ?Recent Labs  ?Lab 09/08/21 ?0241 09/08/21 ?0500 09/09/21 ?0430  ?NA 142 138 135  ?K 4.3 3.9 4.9  ?CL 104 101 98  ?CO2 18* 23 26  ?GLUCOSE 123* 124* 100*  ?BUN 28* 26* 31*  ?CREATININE 9.88* 9.74* 6.09*  ?CALCIUM 9.1 8.8* 8.9  ?PHOS 2.9  --  3.1  ? ?CBC: ?Recent Labs  ?Lab 09/07/21 ?1955 09/07/21 ?2008 09/07/21 ?2243 09/08/21 ?0500 09/09/21 ?0430  ?WBC 15.8*  --   --  5.3 9.8  ?NEUTROABS 6.3  --   --   --  8.0*  ?HGB 10.7*   < > 11.9* 11.0* 10.5*  ?HCT 39.0   < > 35.0* 38.6 35.0*  ?MCV 97.3  --   --  94.8 90.2  ?PLT 295  --   --  172 185  ? < > = values in this interval not displayed.  ? ?Blood Culture ?   ?Component Value Date/Time  ? SDES TISSUE WRIST LEFT SYNOVIAL 05/27/2021 1906  ? SDES WRIST LEFT SYNOVIAL 05/27/2021 1906  ? [place] IN A CUP 05/27/2021 1906  ? [place] SWABS 05/27/2021 1906  ? CULT  05/27/2021 1906  ?  RARE PROPIONIBACTERIUM ACNES ?Standardized susceptibility testing for this organism is not available. ?Performed at [facility], [place] [address] [place], [place] [address] ?  ? CULT  05/27/2021 1906  ?  No growth aerobically or anaerobically. ?Performed at [facility], [place] [address], [place], [place] [address] ?  ? REPTSTATUS 06/01/2021 FINAL 05/27/2021 1906  ? REPTSTATUS 06/01/2021 FINAL 05/27/2021 1906  ? ? ? ?Physical Exam ?General: Alert, nad ?Heart: RRR No m,r,g,  ?Lungs: Diminished air movement, no wheeze, rales ?Abdomen:  soft non-tender ?Extremities: No LE edema  ?Dialysis Access: LUE AVG no bruit; R IJ TDC in place  ? ?Medications: ? sodium chloride    ? sodium chloride    ? cefTRIAXone (ROCEPHIN)  IV Stopped (09/08/21 1005)  ? ? amLODipine  10 mg Oral Daily  ? budesonide (PULMICORT) nebulizer solution  0.25 mg Nebulization BID  ? carvedilol  3.125 mg Oral BID WC  ? Chlorhexidine Gluconate Cloth  6 each Topical Q0600  ? enalaprilat  0.625 mg Intravenous Once  ? fluticasone  2 spray Each Nare Daily  ? hydroxychloroquine  200 mg Oral Daily  ? ipratropium-albuterol  3 mL Nebulization BID  ? loratadine  10 mg Oral Daily  ? methylPREDNISolone (SOLU-MEDROL) injection  80 mg Intravenous Daily  ? multivitamin  1 tablet Oral Daily  ? pantoprazole  40 mg Oral Q0600  ? sevelamer carbonate  2,400 mg Oral TID WC  ? ? ?Dialysis Orders:  ?[facility] [place] ?  3h 51mn  49.5kg  P2  350/1.5  2/2 bath  LUA AVG Hep 3600  ?- mircera 200 q2, last 4/22 ?- sensipar 60 mg tiw ?-  last Hb 10.0 on 4/27 ? ?Assessment/Plan: ?Acute hypoxic and hypercarbic RF - improved w/ bipap, Mg, solumedrol, inhalers in ED. CXR showed bilat pulm edema. HD yesterday with 2.5L removed. Now on RA.  ?Clotted LUA AVG - IR placed TDC 5/2. For declot per IR  ?ESRD - on HD TTS.  Next HD 5/4 on schedule.  ?HTN/ vol - BP remains elevated. Now slightly under EDW. Challenge with next HD.  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  ? ?Lynnda Child PA-C ?[facility] ?09/09/2021,10:16 AM ? ? ? ? ? ? ? ?

## 2021-09-09 NOTE — Progress Notes (Signed)
Mobility Specialist Progress Note: ? ? 09/09/21 1120  ?Mobility  ?Activity Ambulated independently in room  ?Level of Assistance Standby assist, set-up cues, supervision of patient - no hands on  ?Assistive Device None  ?Distance Ambulated (ft) 50 ft  ?Activity Response Tolerated well  ?$Mobility charge 1 Mobility  ? ?Pt agreeable to short mobility session d/t food being here and pt hungry. No physical assistance needed. Pt left sitting EOB eating breakfast. ? ?Nelta Numbers ?Acute Rehab ?Phone: [phone] ?Office Phone: [phone] ? ?

## 2021-09-09 NOTE — Evaluation (Signed)
Physical Therapy Evaluation ?Patient Details ?Name: Kathleen [name] ?MRN: [id number] ?DOB: 12-24-1960 ?Today's Date: 09/09/2021 ? ?History of Present Illness ? Patient is a 61 y/o female who presents on 5/1 with SOB. Found to have acute respiratory failure with hypoxia and hypercapnia, acute on chronic CHF exacerbation, COPD exacerbation, clotted LUE fistula now s/p Rt IJ tunneled HD catheter 5/2. PMH includes chronic combined systolic/diastolic HF, OHYWV-37, ESRD on HD, COPD, HTN, migraine, lupus.  ?Clinical Impression ? Patient reports being independent for ADLs and IADLs at baseline and fluctuates between living in her car and staying with her brother. Today, pt tolerated ambulation and stair training with Mod I for safety due to impulsivity. Pt likely at functional and cognitive baseline. Tolerated head turns and changes in direction without LOB. Noted to have mild SOB with stair training needing 1 standing rest break. Difficult to get SP02 reading due to nails however once back in room, Sp02 100% on RA. Due to pt functioning close to baseline and mobilizing at Mod I level, will discharge from therapy services and defer to mobility tech team.   ?   ? ?Recommendations for follow up therapy are one component of a multi-disciplinary discharge planning process, led by the attending physician.  Recommendations may be updated based on patient status, additional functional criteria and insurance authorization. ? ?Follow Up Recommendations No PT follow up ? ?  ?Assistance Recommended at Discharge PRN  ?Patient can return home with the following ?   ? ?  ?Equipment Recommendations None recommended by PT  ?Recommendations for Other Services ?    ?  ?Functional Status Assessment Patient has not had a recent decline in their functional status  ? ?  ?Precautions / Restrictions Precautions ?Precautions: Fall ?Restrictions ?Weight Bearing Restrictions: No  ? ?  ? ?Mobility ? Bed Mobility ?Overal bed mobility: Modified  Independent ?  ?  ?  ?  ?  ?  ?General bed mobility comments: Mod I solely for safety due to minimal impulsivity with movement ?  ? ?Transfers ?Overall transfer level: Modified independent ?Equipment used: None ?  ?  ?  ?  ?  ?  ?  ?General transfer comment: Stood from EOB x1, impulsively jumping up and breaking into some dance moves ?  ? ?Ambulation/Gait ?Ambulation/Gait assistance: Independent ?Gait Distance (Feet): 300 Feet ?Assistive device: None ?Gait Pattern/deviations: Step-through pattern, Decreased stride length, Drifts right/left ?  ?Gait velocity interpretation: 1.31 - 2.62 ft/sec, indicative of limited community ambulator ?  ?General Gait Details: Mostly steady gait with mild drifting noted with head turns but no overt LOB. Likely at baseline. Difficult to get 02 reading due to nails. 2/4 DOE post stairs. ? ?Stairs ?Stairs: Yes ?Stairs assistance: Supervision ?Stair Management: Alternating pattern, Step to pattern, One rail Right ?Number of Stairs: 36 ?General stair comments: Alternating pattern to ascend steps and step to pattern to descend steps, rest break at top of stairs. ? ?Wheelchair Mobility ?  ? ?Modified Rankin (Stroke Patients Only) ?  ? ?  ? ?Balance Overall balance assessment: Mild deficits observed, not formally tested ?  ?  ?  ?  ?  ?  ?  ?  ?  ?  ?  ?  ?  ?  ?  ?  ?  ?  ?   ? ? ? ?Pertinent Vitals/Pain Pain Assessment ?Pain Assessment: No/denies pain ?Pain Location: where  ? ? ?Home Living Family/patient expects to be discharged to:: Private residence ?Living Arrangements:  (brother) ?Available Help  at Discharge: Family;Friend(s);Available PRN/intermittently ?Type of Home: Apartment ?Home Access: Stairs to enter ?  ?  ?  ?Home Layout: One level ?  ?Additional Comments: Brother has habits that are not clean, like urinating in sink and not changing clothes for weeks. Pt desires to find another place to stay. Sleeps in chair.  ?  ?Prior Function Prior Level of Function : Independent/Modified  Independent;Driving ?  ?  ?  ?  ?  ?  ?Mobility Comments: Able to walk in the house without assist. sleeps in car some of the time, reports being homeless ?ADLs Comments: Does all the housekeeping and cooking for her brother, assists with community mobility, able to perform her own self care.  Per patient, brother is very unkempt, relies on her for most IADL ?  ? ? ?Hand Dominance  ? Dominant Hand: Right ? ?  ?Extremity/Trunk Assessment  ? Upper Extremity Assessment ?Upper Extremity Assessment: Defer to OT evaluation ?  ? ?Lower Extremity Assessment ?Lower Extremity Assessment: Overall WFL for tasks assessed ?  ? ?Cervical / Trunk Assessment ?Cervical / Trunk Assessment: Normal  ?Communication  ? Communication: No difficulties  ?Cognition Arousal/Alertness: Awake/alert ?Behavior During Therapy: Impulsive ?Overall Cognitive Status: No family/caregiver present to determine baseline cognitive functioning ?  ?  ?  ?  ?  ?  ?  ?  ?  ?  ?  ?  ?  ?  ?  ?  ?General Comments: Impulsive. Tangential, emotional when talking about brother. Follows commands, poor ability to self monitor activity level/safety but likely at baseline. ?  ?  ? ?  ?General Comments General comments (skin integrity, edema, etc.): After changing pulse ox on finger, Sp02 reading 100% on RA. ? ?  ?Exercises    ? ?Assessment/Plan  ?  ?PT Assessment Patient does not need any further PT services  ?PT Problem List   ? ?   ?  ?PT Treatment Interventions     ? ?PT Goals (Current goals can be found in the Care Plan section)  ?Acute Rehab PT Goals ?Patient Stated Goal: to find a new place to live ?PT Goal Formulation: All assessment and education complete, DC therapy ? ?  ?Frequency   ?  ? ? ?Co-evaluation   ?  ?  ?  ?  ? ? ?  ?AM-PAC PT "6 Clicks" Mobility  ?Outcome Measure Help needed turning from your back to your side while in a flat bed without using bedrails?: None ?Help needed moving from lying on your back to sitting on the side of a flat bed without  using bedrails?: None ?Help needed moving to and from a bed to a chair (including a wheelchair)?: None ?Help needed standing up from a chair using your arms (e.g., wheelchair or bedside chair)?: None ?Help needed to walk in hospital room?: None ?Help needed climbing 3-5 steps with a railing? : A Little ?6 Click Score: 23 ? ?  ?End of Session Equipment Utilized During Treatment: Gait belt ?Activity Tolerance: Patient tolerated treatment well ?Patient left: in bed;with call bell/phone within reach ?Nurse Communication: Mobility status ?PT Visit Diagnosis: Difficulty in walking, not elsewhere classified (R26.2) ?  ? ?Time: 9379-0240 ?PT Time Calculation (min) (ACUTE ONLY): 24 min ? ? ?Charges:   PT Evaluation ?$PT Eval Moderate Complexity: 1 Mod ?PT Treatments ?$Gait Training: 8-22 mins ?  ?   ? ? ?Marisa Severin, PT, DPT ?Acute Rehabilitation Services ?Secure chat preferred ?Office [phone] ? ? ? ? ?[place] ?09/09/2021,  12:17 PM ? ?

## 2021-09-09 NOTE — TOC Initial Note (Signed)
Transition of Care (TOC) - Initial/Assessment Note  ? ? ?Patient Details  ?Name: Kathleen [name] ?MRN: [id number] ?Date of Birth: 03-05-1961 ? ?Transition of Care (TOC) CM/SW Contact:    ?Zenon Mayo, RN ?Phone Number: ?09/09/2021, 2:52 PM ? ?Clinical Narrative:                 ?Patient is from home with her brother, she is indep, she states she is thinking about finding her some where else to live.  She states her friend will transport her home at discharge.  She also states she has no issue with transport to HD.  She presents wit clot in her fistula and her for declotting.  TOC will continue to follow for dc needs.  ? ?Expected Discharge Plan: Home/Self Care ?Barriers to Discharge: Continued Medical Work up ? ? ?Patient Goals and CMS Choice ?Patient states their goals for this hospitalization and ongoing recovery are:: return home with brother ?  ?Choice offered to / list presented to : NA ? ?Expected Discharge Plan and Services ?Expected Discharge Plan: Home/Self Care ?  ?Discharge Planning Services: CM Consult ?Post Acute Care Choice: NA ?Living arrangements for the past 2 months: Apartment ?                ?  ?DME Agency: NA ?  ?  ?  ?HH Arranged: NA ?  ?  ?  ?  ? ?Prior Living Arrangements/Services ?Living arrangements for the past 2 months: Apartment ?Lives with:: Siblings (brother) ?Patient language and need for interpreter reviewed:: Yes ?Do you feel safe going back to the place where you live?: Yes      ?Need for Family Participation in Patient Care: No (Comment) ?Care giver support system in place?: Yes (comment) ?  ?Criminal Activity/Legal Involvement Pertinent to Current Situation/Hospitalization: No - Comment as needed ? ?Activities of Daily Living ?  ?  ? ?Permission Sought/Granted ?  ?  ?   ?   ?   ?   ? ?Emotional Assessment ?Appearance:: Appears stated age ?Attitude/Demeanor/Rapport: Engaged ?Affect (typically observed): Appropriate ?Orientation: : Oriented to Self, Oriented to Place,  Oriented to  Time, Oriented to Situation ?Alcohol / Substance Use: Not Applicable ?Psych Involvement: No (comment) ? ?Admission diagnosis:  Acute pulmonary edema ([place]) [J81.0] ?COPD exacerbation ([place]) [J44.1] ?Acute respiratory failure with hypoxia ([place]) [J96.01] ?Acute respiratory failure with hypoxia and hypercapnia (HCC) [J96.01, J96.02] ?Patient Active Problem List  ? Diagnosis Date Noted  ? Uremia 05/16/2021  ? Rectal bleeding 05/15/2021  ? Acute on chronic combined systolic and diastolic CHF (congestive heart failure) ([place]) 05/15/2021  ? FTT (failure to thrive) in adult 05/15/2021  ? Hyperkalemia 03/12/2021  ? Prolonged QT interval 03/12/2021  ? Acute respiratory failure with hypoxia and hypercapnia ([place]) 03/12/2021  ? Acute on chronic diastolic congestive heart failure ([place]) 03/12/2021  ? Onychomycosis 11/25/2018  ? Pre-ulcerative calluses 11/25/2018  ? Neuropathy 10/04/2018  ? Acute encephalopathy 07/11/2017  ? ESRD (end stage renal disease) ([place]) 07/11/2017  ? COPD with acute exacerbation ([place])   ? Anemia in ESRD (end-stage renal disease) ([place])   ? Coagulation defect, unspecified ([place]) 07/09/2017  ? 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 Iron deficiency anemia, unspecified 07/09/2017  ? Secondary hyperparathyroidism of renal origin ([place]) 07/09/2017  ? Tobacco use 07/09/2017  ? Tobacco dependence 11/30/2016  ? Malnutrition of moderate degree 07/14/2016  ? Lupus ([place]) 06/28/2016  ? Hypertension 06/28/2016  ? SLE exacerbation ([place]) 06/28/2016  ?  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 High risk medication use 05/31/2012  ? ?PCP:  Angela Burke, NP ?Pharmacy:   ?[id number] [place], [place] - [address]?[place] [address]?Phone: [phone]: [phone]? ? ? ? ?Social Determinants of Health (SDOH) Interventions ?  ? ?Readmission Risk Interventions ? ?  09/09/2021  ? 12:39  PM  ?Readmission Risk Prevention Plan  ?Transportation Screening Complete  ?Medication Review (Press photographer Complete  ?PCP or Specialist appointment within 3-5 days of discharge Complete  ?[facility] Consult Complete  ?SW Recovery Care/Counseling Consult Complete  ?Palliative Care Screening Not Applicable  ?[place] Applic[place]  ? ? ? ?

## 2021-09-09 NOTE — Evaluation (Signed)
Occupational Therapy Evaluation ?Patient Details ?Name: Kathleen [name] ?MRN: [id number] ?DOB: [birth year] ?Today's Date: 09/09/2021 ? ? ?History of Present Illness Patient is a 61 y/o female who presents on 5/1 with SOB. Found to have acute respiratory failure with hypoxia and hypercapnia, acute on chronic CHF exacerbation, COPD exacerbation, clotted LUE fistula now s/p Rt IJ tunneled HD catheter 5/2. PMH includes chronic combined systolic/diastolic HF, Covid-19, ESRD on HD, COPD, HTN, migraine, lupus.  ? ?Clinical Impression ?  ?Prior to this admission, patient living with brother and managing her own medication and driving herself to dialysis. Currently, patient is at her baseline for basic ADLs, but is minimally impulsive with all movement (patient attempting to stand on one leg to demonstrate to OT that she felt better). OT not recommending follow up at discharge, but will continue to follow acutely to assess cognition with regard to medications and provide education for energy conservation for safe discharge home.  ?   ? ?Recommendations for follow up therapy are one component of a multi-disciplinary discharge planning process, led by the attending physician.  Recommendations may be updated based on patient status, additional functional criteria and insurance authorization.  ? ?Follow Up Recommendations ? No OT follow up  ?  ?Assistance Recommended at Discharge PRN  ?Patient can return home with the following Direct supervision/assist for medications management;Assist for transportation;A little help with bathing/dressing/bathroom;Help with stairs or ramp for entrance;Direct supervision/assist for financial management;Assistance with cooking/housework ? ?  ?Functional Status Assessment ? Patient has had a recent decline in their functional status and demonstrates the ability to make significant improvements in function in a reasonable and predictable amount of time.  ?Equipment Recommendations ? None recommended  by OT  ?  ?Recommendations for Other Services   ? ? ?  ?Precautions / Restrictions Precautions ?Precautions: Fall ?Restrictions ?Weight Bearing Restrictions: No  ? ?  ? ?Mobility Bed Mobility ?Overal bed mobility: Modified Independent ?  ?  ?  ?  ?  ?  ?General bed mobility comments: Mod I solely for safety due to minimal impsulsivity with movement ?  ? ?Transfers ?Overall transfer level: Modified independent ?Equipment used: None ?  ?  ?  ?  ?  ?  ?  ?General transfer comment: Completed sit<>stand with OT, impulsive with movement, likely is her baseline ?  ? ?  ?Balance Overall balance assessment: Mild deficits observed, not formally tested ?  ?  ?  ?  ?  ?  ?  ?  ?  ?  ?  ?  ?  ?  ?  ?  ?  ?  ?   ? ?ADL either performed or assessed with clinical judgement  ? ?ADL Overall ADL's : At baseline ?  ?  ?  ?  ?  ?  ?  ?  ?  ?  ?  ?  ?  ?  ?  ?  ?  ?  ?  ?General ADL Comments: Patient is at her baseline for ADLs, able to engage in lower body dressing in standing, LOB noted, but moreso due to impulsivity and attempting to stand on one leg with OT present to demonstrate ability  ? ? ? ?Vision Baseline Vision/History: 0 No visual deficits ?Ability to See in Adequate Light: 0 Adequate ?Patient Visual Report: No change from baseline (Had appointment with eye doctor "across the street" but missed it, did not elaborate on vision) ?   ?   ?Perception   ?  ?Praxis   ?  ? ?  Pertinent Vitals/Pain Pain Assessment ?Pain Assessment: Faces ?Faces Pain Scale: Hurts even more ?Pain Location: IV site, stating the IV is burning RN notified ?Pain Descriptors / Indicators: Burning, Discomfort ?Pain Intervention(s): Limited activity within patient's tolerance, Monitored during session, Other (comment) (Notified RN)  ? ? ? ?Hand Dominance Right ?  ?Extremity/Trunk Assessment Upper Extremity Assessment ?Upper Extremity Assessment: Overall WFL for tasks assessed ?  ?Lower Extremity Assessment ?Lower Extremity Assessment: Defer to PT evaluation ?   ?Cervical / Trunk Assessment ?Cervical / Trunk Assessment: Normal ?  ?Communication Communication ?Communication: No difficulties ?  ?Cognition Arousal/Alertness: Awake/alert ?Behavior During Therapy: Impulsive ?Overall Cognitive Status: Within Functional Limits for tasks assessed ?  ?  ?  ?  ?  ?  ?  ?  ?  ?  ?  ?  ?  ?  ?  ?  ?General Comments: More than likely at her baseline, impulsive with movement, OT will conitnue to assess cognition ?  ?  ?General Comments    ? ?  ?Exercises   ?  ?Shoulder Instructions    ? ? ?Home Living Family/patient expects to be discharged to:: Private residence ?Living Arrangements: Other relatives ?Available Help at Discharge: Family;Friend(s) ?Type of Home: Apartment ?Home Access: Stairs to enter ?  ?  ?Home Layout: One level ?  ?  ?Bathroom Shower/Tub: Tub/shower unit ?  ?Bathroom Toilet: Standard ?  ?  ?  ?  ?Additional Comments: Brother has habits that are not clean, like urinating in sink and not changing clothes for weeks. Pt desires to find another place to stay. Sleeps in chair. ?  ? ?  ?Prior Functioning/Environment Prior Level of Function : Independent/Modified Independent;Driving ?  ?  ?  ?  ?  ?  ?Mobility Comments: Able to walk in the house without assist. ?ADLs Comments: Does all the housekeeping and cooking for her brother, assists with community mobility, able to perform her own self care.  Per patient, brother is very unkempt, relies on her for most IADL ?  ? ?  ?  ?OT Problem List: Decreased activity tolerance;Decreased coordination;Pain;Impaired balance (sitting and/or standing);Decreased safety awareness;Decreased cognition;Decreased knowledge of precautions ?  ?   ?OT Treatment/Interventions: Self-care/ADL training;Therapeutic exercise;Energy conservation;DME and/or AE instruction;Manual therapy;Therapeutic activities;Cognitive remediation/compensation;Patient/family education;Balance training  ?  ?OT Goals(Current goals can be found in the care plan section)  Acute Rehab OT Goals ?Patient Stated Goal: to go home ?OT Goal Formulation: With patient ?Time For Goal Achievement: 09/23/21 ?Potential to Achieve Goals: Good ?ADL Goals ?Pt Will Transfer to Toilet: Independently;ambulating ?Additional ADL Goal #1: Patient will pass pillbox test with no errors in order to manage medications safely at discharge. ?Additional ADL Goal #2: Patient will verbalize 3 strategies for energy conservation for safe discharge home.  ?OT Frequency: Min 2X/week ?  ? ?Co-evaluation   ?  ?  ?  ?  ? ?  ?AM-PAC OT "6 Clicks" Daily Activity     ?Outcome Measure Help from another person eating meals?: Total (NPO) ?Help from another person taking care of personal grooming?: None ?Help from another person toileting, which includes using toliet, bedpan, or urinal?: None ?Help from another person bathing (including washing, rinsing, drying)?: A Little ?Help from another person to put on and taking off regular upper body clothing?: None ?Help from another person to put on and taking off regular lower body clothing?: None ?6 Click Score: 20 ?  ?End of Session Nurse Communication: Mobility status;Other (comment) (grimacing in pain due to IV site  burning, RN notified with RN returning to room immediately following evaluation) ? ?Activity Tolerance: Patient tolerated treatment well ?Patient left: in bed;with call bell/phone within reach;Other (comment) (sitting EOB, grimacing in pain due to IV site burning, RN notified with RN returning to room immediately following evaluation) ? ?OT Visit Diagnosis: Unsteadiness on feet (R26.81);Pain ?Pain - Right/Left: Right ?Pain - part of body: Hand (IV site)  ?              ?Time: 2952-8413 ?OT Time Calculation (min): 10 min ?Charges:  OT General Charges ?$OT Visit: 1 Visit ?OT Evaluation ?$OT Eval Moderate Complexity: 1 Mod ? ?Kathleen [name], OTR/L ?Acute Rehabilitation Services ?[phone] ?[phone]  ? ?Kathleen Glen Sair Faulcon ?09/09/2021, 10:53 AM ?

## 2021-09-09 NOTE — Hospital Course (Signed)
Mrs. Guizar was admitted to the hospital with the working diagnosis of respiratory failure due to volume overload in the setting of ESRD on HD.  ? ?61 yo female with the past medical history of ESRD on HD, lupus, seizures and COPD who presented with respiratory distress. Patient not compliant with fluid restriction at home. In the Ed she was in respiratory distress and required non invasive mechanical ventilation. Her blood pressure 149.93, RR 15, positive JVD, diffuse wheezing bilaterally, heart with S1 and S2 present and rhythmic, abdomen not distended and positive lower extremity edema.  ? ?Chest radiograph with right rotation, positive cardiomegaly, bilateral interstitial infiltrates, lower lobes and right upper lobe.  ? ?EKG 113 bpm, normal axis and normal intervals, sinus rhythm with poor R wave progression, no significant ST segment or T wave changes. ? ?Patient evaluated by nephrology, needing declotting left upper extremity dialysis fistula.  ?Underwent urgent right IJ tunneled HD catheter placement and posteriorly hemodialysis. ? ?Patient was liberated from non invasive mechanical ventilation.  ?Pending declotting of AV fistula.  ?

## 2021-09-09 NOTE — Progress Notes (Signed)
?  Progress Note ? ? ?Kathleen [name]: Kathleen Frede HAL:[id number] DOB: 1960-09-12 DOA: 09/07/2021     1 ?DOS: the Kathleen [name] was seen and examined on 09/09/2021 ?  ?Brief hospital course: ?Kathleen [name] was admitted to the hospital with the working diagnosis of respiratory failure due to volume overload in the setting of ESRD on HD.  ? ?61 yo female with the past medical history of ESRD on HD, lupus, seizures and COPD who presented with respiratory distress. Kathleen [name] not compliant with fluid restriction at home. In the Ed Kathleen [name] was in respiratory distress and required non invasive mechanical ventilation. Her blood pressure 149.93, RR 15, positive JVD, diffuse wheezing bilaterally, heart with S1 and S2 present and rhythmic, abdomen not distended and positive lower extremity edema.  ? ?Chest radiograph with right rotation, positive cardiomegaly, bilateral interstitial infiltrates, lower lobes and right upper lobe.  ? ?EKG 113 bpm, normal axis and normal intervals, sinus rhythm with poor R wave progression, no significant ST segment or T wave changes. ? ?Kathleen [name] evaluated by nephrology, needing declotting left upper extremity dialysis fistula.  ?Underwent urgent right IJ tunneled HD catheter placement and posteriorly hemodialysis. ? ?Kathleen [name] was liberated from non invasive mechanical ventilation.  ?Pending declotting of AV fistula.  ? ?Assessment and Plan: ?* Acute respiratory failure with hypoxia and hypercapnia (HCC) ?Respiratory failure has improved. ?Follow up VBG with pH 7,39 and Pco2 46,9 ? ?Kathleen [name] out of bed and ambulating ?Continue oxymetry monitoring.  ? ?ESRD (end stage renal disease) ([place]) ?Volume overload and acute pulmonary edema due to ESRD. ?Her volume status has improved after renal replacement therapy.  ?Ultrafiltration 2,500 ml. ? ?Currently has a IJ HD cathter on the right IJ ?Plan for declotting left upper extremity fistula.  ?Follow up with nephrology for further ultrafiltration.   ? ?COPD with acute  exacerbation ([place]) ?Today with no wheezing or rhonchi ?Plan to continue bronchodilator therapy. ?Discontinue steroids. ?Kathleen [name] is out of bed and ambulating, continue oxymetry monitoring ?Discontinue Bipap.  ? ?No clinical signs of pneumonia, will discontinue antibiotic therapy.  ? ?Acute on chronic combined systolic and diastolic CHF (congestive heart failure) ([place]) ?Preserved LV systolic function, continue blood pressure monitoring ?Ultrafiltration per HD. ? ? ?Lupus ([place]) ?Continue Plaquenil ? ?Hypertension ?Blood pressure improved, plan to continue with amlodipine, and carvedilol  ? ? ? ? ?  ? ?Subjective: Kathleen [name] with improvement in dyspnea, no chest pain,.  ? ?Physical Exam: ?Vitals:  ? 09/09/21 0034 09/09/21 0424 09/09/21 7353 09/09/21 0901  ?BP:  (!) 156/110 (!) 148/123   ?Pulse: 88 88 78   ?Resp:  18 18   ?Temp:  98.3 ?F (36.8 ?C) 98 ?F (36.7 ?C)   ?TempSrc:  Oral Oral   ?SpO2: 100% 100% 95% 98%  ?Weight: 50.3 kg     ? ?Neurology awake and alert ?ENT with mild pallor ?Cardiovascular with S1 and S2 present and rhythmic ?Respiratory with no rales or wheezing, no rhonchi ?Abdomen not distended ?No lower extremity edema   ?Data Reviewed: ? ? ? ?Family Communication: no family at the bedside  ? ?Disposition: ?Status is: Inpatient ?Remains inpatient appropriate because: pending fistula declotting  ? Planned Discharge Destination: Home ? ? ?Author: ?Tawni Millers, MD ?09/09/2021 12:56 PM ? ?For on call review [email or web address].  ?

## 2021-09-09 NOTE — Progress Notes (Signed)
Initial Nutrition Assessment ? ?DOCUMENTATION CODES:  ? ?Non-severe (moderate) malnutrition in context of chronic illness ? ?INTERVENTION:  ?- Liberalize diet from a heart healthy to a 2g sodium diet to provide widest variety of menu options to enhance nutritional adequacy ? ?- Renal MVI daily ? ?- Ensure Enlive po BID, each supplement provides 350 kcal and 20 grams of protein. ? ?NUTRITION DIAGNOSIS:  ? ?Moderate Malnutrition related to chronic illness (ESRD, lupus, COPD) as evidenced by moderate fat depletion, severe muscle depletion, moderate muscle depletion. ? ?GOAL:  ? ?Patient will meet greater than or equal to 90% of their needs ? ?MONITOR:  ? ?PO intake, Supplement acceptance, Diet advancement, Labs, Weight trends, TF tolerance, I & O's ? ?REASON FOR ASSESSMENT:  ? ?Consult ?Assessment of nutrition requirement/status ? ?ASSESSMENT:  ? ?Pt admitted with respiratory distress secondary to acute respiratory failure with hypoxia and hypercapnia. 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 ?  ?05/02- tunneled cath placement; emergent dialysis session ? ?Noted plans to declot tunneled cath, however pt ate today, will need to postpone.  ? ?Provided therapeutic listening as pt explained and got emotional about her brother's health. She reports having a really great appetite and has eaten 100% of meals provided during admission. She states that she is mindful of her salt intake at home. Pt also reports that her food stamps expired 1 month ago and that her friend has been able to provide meals for her. Pt's lunch tray was delivered during visit. ? ?Pt is unsure of her EDW but states that her weight has ben stable.   ? ?Reviewed weight history. Pt's weight appears to remain stable between 48.7-50.3 kg. Will continue to monitor throughout admission. ? ?Given malnutrition, pt would benefit from a liberalized diet to provide widest variety of menu items to encourage adequate PO  intake.  ? ?Medications: plaquenil, solu-medrol, rena-vit, protonix, renvela, IV abx ? ?Labs: sodium 135 (WNL), potassium 4.9 (WNL), BUN 31, Cr 6.09, phosphorus 3.1 (WNL),  ionized calcium 1.07, AST 51, GFR 7 ? ?05/02: Post HD NET UF 2.5L ? ?I/O's: -232m since admission ? ?NUTRITION - FOCUSED PHYSICAL EXAM: ? ?Flowsheet Row Most Recent Value  ?Orbital Region Moderate depletion  ?Upper Arm Region Severe depletion  ?Thoracic and Lumbar Region Moderate depletion  ?Buccal Region Moderate depletion  ?Temple Region Moderate depletion  ?Clavicle Bone Region Moderate depletion  ?Clavicle and Acromion Bone Region Severe depletion  ?Scapular Bone Region Moderate depletion  ?Dorsal Hand Moderate depletion  ?Patellar Region Moderate depletion  ?Anterior Thigh Region Severe depletion  ?Posterior Calf Region Moderate depletion  ?Edema (RD Assessment) None  ?Hair Reviewed  ?Eyes Reviewed  ?Mouth Reviewed  ?Skin Reviewed  ?Nails Reviewed  ? ?  ? ? ?Diet Order:   ?Diet Order   ? ?       ?  Diet 2 gram sodium Room service appropriate? Yes; Fluid consistency: Thin  Diet effective now       ?  ? ?  ?  ? ?  ? ? ?EDUCATION NEEDS:  ? ?Education needs have been addressed ? ?Skin:  Skin Assessment: Reviewed RN Assessment ? ?Last BM:  5/1 ? ?Height:  ? ?Ht Readings from Last 1 Encounters:  ?06/11/21 '5\' 4"'$  (1.626 m)  ? ? ?Weight:  ? ?Wt Readings from Last 1 Encounters:  ?09/09/21 50.3 kg  ? ?BMI:  Body mass index is 19.02 kg/m?. ? ?Estimated Nutritional Needs:  ? ?Kcal:  1600-1800 ? ?  Protein:  80-95g ? ?Fluid:  1L + UOP ? ?Clayborne Dana, RDN, LDN ?Clinical Nutrition ?

## 2021-09-09 NOTE — Progress Notes (Signed)
Mobility Specialist Progress Note: ? ? 09/09/21 1300  ?Mobility  ?Activity Ambulated independently in hallway  ?Level of Assistance Independent  ?Assistive Device None  ?Distance Ambulated (ft) 900 ft  ?Activity Response Tolerated well  ?$Mobility charge 1 Mobility  ? ?Pt eager for mobility session. Required no physical assist. Ambulated at supervision level. Back in room with all needs met.  ? ?Nelta Numbers ?Acute Rehab ?Phone: [phone] ?Office Phone: [phone] ? ?

## 2021-09-10 ENCOUNTER — Inpatient Hospital Stay (HOSPITAL_COMMUNITY): Payer: Medicare Other

## 2021-09-10 DIAGNOSIS — J9601 Acute respiratory failure with hypoxia: Secondary | ICD-10-CM | POA: Diagnosis not present

## 2021-09-10 DIAGNOSIS — N186 End stage renal disease: Secondary | ICD-10-CM | POA: Diagnosis not present

## 2021-09-10 DIAGNOSIS — I5043 Acute on chronic combined systolic (congestive) and diastolic (congestive) heart failure: Secondary | ICD-10-CM | POA: Diagnosis not present

## 2021-09-10 DIAGNOSIS — J441 Chronic obstructive pulmonary disease with (acute) exacerbation: Secondary | ICD-10-CM | POA: Diagnosis not present

## 2021-09-10 HISTORY — PX: IR US GUIDE VASC ACCESS LEFT: IMG2389

## 2021-09-10 HISTORY — PX: IR THROMBECTOMY AV FISTULA W/THROMBOLYSIS/PTA INC/SHUNT/IMG LEFT: IMG6106

## 2021-09-10 LAB — BASIC METABOLIC PANEL
Anion gap: 13 (ref 5–15)
BUN: 55 mg/dL — ABNORMAL HIGH (ref 6–20)
CO2: 27 mmol/L (ref 22–32)
Calcium: 9.2 mg/dL (ref 8.9–10.3)
Chloride: 96 mmol/L — ABNORMAL LOW (ref 98–111)
Creatinine, Ser: 8.3 mg/dL — ABNORMAL HIGH (ref 0.44–1.00)
GFR, Estimated: 5 mL/min — ABNORMAL LOW (ref >60)
Glucose, Bld: 111 mg/dL — ABNORMAL HIGH (ref 70–99)
Potassium: 4.8 mmol/L (ref 3.5–5.1)
Sodium: 136 mmol/L (ref 135–145)

## 2021-09-10 MED ORDER — MIDAZOLAM HCL 2 MG/2ML IJ SOLN
INTRAMUSCULAR | Status: AC
  Filled 2021-09-10: qty 2

## 2021-09-10 MED ORDER — LIDOCAINE HCL 1 % IJ SOLN
INTRAMUSCULAR | Status: AC
  Administered 2021-09-10: 10 mL
  Filled 2021-09-10: qty 20

## 2021-09-10 MED ORDER — HEPARIN SODIUM (PORCINE) 1000 UNIT/ML IJ SOLN
INTRAMUSCULAR | Status: AC
Start: 2021-09-10 — End: 2021-09-11
  Filled 2021-09-10: qty 10

## 2021-09-10 MED ORDER — FENTANYL CITRATE (PF) 100 MCG/2ML IJ SOLN
INTRAMUSCULAR | Status: AC | PRN
  Administered 2021-09-10 (×3): 25 ug via INTRAVENOUS

## 2021-09-10 MED ORDER — HEPARIN SODIUM (PORCINE) 1000 UNIT/ML IJ SOLN
INTRAMUSCULAR | Status: AC | PRN
  Administered 2021-09-10: 3000 [IU] via INTRAVENOUS

## 2021-09-10 MED ORDER — PENTAFLUOROPROP-TETRAFLUOROETH EX AERO: 1.0000 "application " | INHALATION_SPRAY | CUTANEOUS | Status: DC | PRN

## 2021-09-10 MED ORDER — SODIUM CHLORIDE 0.9 % IV SOLN: 100.0000 mL | INTRAVENOUS | Status: DC | PRN

## 2021-09-10 MED ORDER — LIDOCAINE-PRILOCAINE 2.5-2.5 % EX CREA: 1.0000 "application " | TOPICAL_CREAM | CUTANEOUS | Status: DC | PRN

## 2021-09-10 MED ORDER — HYDRALAZINE HCL 50 MG PO TABS
50.0000 mg | ORAL_TABLET | Freq: Two times a day (BID) | ORAL | Status: DC
  Administered 2021-09-10 – 2021-09-11 (×2): 50 mg via ORAL
  Filled 2021-09-10 (×2): qty 1

## 2021-09-10 MED ORDER — LIDOCAINE HCL (PF) 1 % IJ SOLN: 5.0000 mL | INTRAMUSCULAR | Status: DC | PRN

## 2021-09-10 MED ORDER — ALTEPLASE 1 MG/ML SYRINGE FOR VASCULAR PROCEDURE
INTRAMUSCULAR | Status: AC | PRN
  Administered 2021-09-10: 2 mg via INTRA_ARTERIAL

## 2021-09-10 MED ORDER — IOHEXOL 300 MG/ML  SOLN
100.0000 mL | Freq: Once | INTRAMUSCULAR | Status: AC | PRN
  Administered 2021-09-10: 35 mL via INTRA_ARTERIAL

## 2021-09-10 MED ORDER — ALTEPLASE 2 MG IJ SOLR
INTRAMUSCULAR | Status: AC
  Filled 2021-09-10: qty 2

## 2021-09-10 MED ORDER — ACETAMINOPHEN 325 MG PO TABS
650.0000 mg | ORAL_TABLET | Freq: Four times a day (QID) | ORAL | Status: DC | PRN
  Administered 2021-09-10: 650 mg via ORAL

## 2021-09-10 MED ORDER — MIDAZOLAM HCL 2 MG/2ML IJ SOLN
INTRAMUSCULAR | Status: AC | PRN
  Administered 2021-09-10 (×4): .5 mg via INTRAVENOUS

## 2021-09-10 MED ORDER — FENTANYL CITRATE (PF) 100 MCG/2ML IJ SOLN
INTRAMUSCULAR | Status: AC
  Filled 2021-09-10: qty 2

## 2021-09-10 MED ORDER — ALTEPLASE 2 MG IJ SOLR: 2.0000 mg | Freq: Once | INTRAMUSCULAR | Status: DC | PRN

## 2021-09-10 MED ORDER — HEPARIN SODIUM (PORCINE) 1000 UNIT/ML DIALYSIS: 1000.0000 [IU] | INTRAMUSCULAR | Status: DC | PRN

## 2021-09-10 MED ORDER — HEPARIN SODIUM (PORCINE) 1000 UNIT/ML DIALYSIS
3000.0000 [IU]/h | Freq: Once | INTRAMUSCULAR | Status: DC
Start: 2021-09-11 — End: 2021-09-10

## 2021-09-10 NOTE — Plan of Care (Signed)

## 2021-09-10 NOTE — Procedures (Signed)
Pt arrived to hemodialysis with BP of 159/93, Pt complains of pain at permcath insertion site.  Education given that as Cathie Beams is still new it will ache and be sore for at least a week.  Dressing was changed as per hospital policy, both limbs flush and draw easily.  Following hemodialysis we were able to remove 2468 mls of ultrafiltrate.  BP at end of tx 168/95. Pt was transferred to her unit in stable condition with no complaints noted. ?

## 2021-09-10 NOTE — Progress Notes (Signed)
Mobility Specialist Progress Note: ? ? 09/10/21 1320  ?Mobility  ?Activity Ambulated independently in hallway  ?Level of Assistance Independent  ?Assistive Device None  ?Distance Ambulated (ft) 200 ft  ?Activity Response Tolerated well  ?$Mobility charge 1 Mobility  ? ?Pt ambulated independently in hallway. Asx throughout. Back in room, ready for procedure this afternoon.  ? ?Kathleen [name] ?Acute Rehab ?Phone: [phone] ?Office Phone: [phone] ? ?

## 2021-09-10 NOTE — Procedures (Signed)
Interventional Radiology Procedure Note ? ?Procedure: s/p successful LUE loop AVG lysis, thrmobectomy, and 68m PTA for declot   ? ?Complications: None ? ?Estimated Blood Loss:  min ? ?Findings: ?Full report in pacs ?   ? ?M. TDaryll Brod MD ? ? ? ?

## 2021-09-10 NOTE — Progress Notes (Signed)
?[facility] ?Progress Note  ? ?Subjective:  Seen in HD unit. Treatment just getting started. No new events overnight. C/o discomfort from new catheter. Clotted AVG -IR to see for declot.  ? ?Objective ?Vitals:  ? 09/10/21 0817 09/10/21 0821 09/10/21 0830 09/10/21 0900  ?BP:  (!) 159/93 (!) 148/93 (!) 158/97  ?Pulse:  68 66   ?Resp:  16    ?Temp:  97.9 ?F (36.6 ?C)    ?TempSrc:  Oral    ?SpO2:  100%    ?Weight: 56.3 kg     ? ? ?Additional Objective ?Labs: ?Basic Metabolic Panel: ?Recent Labs  ?Lab 09/08/21 ?0241 09/08/21 ?0500 09/09/21 ?0430 09/10/21 ?0231  ?NA 142 138 135 136  ?K 4.3 3.9 4.9 4.8  ?CL 104 101 98 96*  ?CO2 18* '23 26 27  '$ ?GLUCOSE 123* 124* 100* 111*  ?BUN 28* 26* 31* 55*  ?CREATININE 9.88* 9.74* 6.09* 8.30*  ?CALCIUM 9.1 8.8* 8.9 9.2  ?PHOS 2.9  --  3.1  --   ? ? ?CBC: ?Recent Labs  ?Lab 09/07/21 ?1955 09/07/21 ?2008 09/07/21 ?2243 09/08/21 ?0500 09/09/21 ?0430  ?WBC 15.8*  --   --  5.3 9.8  ?NEUTROABS 6.3  --   --   --  8.0*  ?HGB 10.7*   < > 11.9* 11.0* 10.5*  ?HCT 39.0   < > 35.0* 38.6 35.0*  ?MCV 97.3  --   --  94.8 90.2  ?PLT 295  --   --  172 185  ? < > = values in this interval not displayed.  ? ? ?Blood Culture ?   ?Component Value Date/Time  ? SDES TISSUE WRIST LEFT SYNOVIAL 05/27/2021 1906  ? SDES WRIST LEFT SYNOVIAL 05/27/2021 1906  ? [place] IN A CUP 05/27/2021 1906  ? [place] SWABS 05/27/2021 1906  ? CULT  05/27/2021 1906  ?  RARE PROPIONIBACTERIUM ACNES ?Standardized susceptibility testing for this organism is not available. ?Performed at [facility], [place] [address] [place], [place] [address] ?  ? CULT  05/27/2021 1906  ?  No growth aerobically or anaerobically. ?Performed at [facility], [place] [address], [place], [place] [address] ?  ? REPTSTATUS 06/01/2021 FINAL 05/27/2021 1906  ? REPTSTATUS 06/01/2021 FINAL 05/27/2021 1906  ? ? ? ?Physical Exam ?General: Alert, nad ?Heart: RRR No m,r,g,  ?Lungs: Diminished air movement, no wheeze, rales ?Abdomen:  soft non-tender ?Extremities: No LE edema  ?Dialysis Access: LUE AVG no bruit; R IJ TDC in place  ? ?Medications: ? [START ON 09/11/2021] sodium chloride    ? [START ON 09/11/2021] sodium chloride    ? ? acetaminophen      ? amLODipine  10 mg Oral Daily  ? budesonide (PULMICORT) nebulizer solution  0.25 mg Nebulization BID  ? carvedilol  3.125 mg Oral BID WC  ? Chlorhexidine Gluconate Cloth  6 each Topical Q0600  ? feeding supplement  237 mL Oral BID BM  ? fluticasone  2 spray Each Nare Daily  ? [START ON 09/11/2021] heparin  3,000 Units/hr Dialysis Once in dialysis  ? hydroxychloroquine  200 mg Oral Daily  ? ipratropium-albuterol  3 mL Nebulization BID  ? loratadine  10 mg Oral Daily  ? multivitamin  1 tablet Oral Daily  ? pantoprazole  40 mg Oral Q0600  ? sevelamer carbonate  2,400 mg Oral TID WC  ? ? ?Dialysis Orders:  ?[facility] [place] ?  3h 17mn  49.5kg  P2  350/1.5  2/2 bath  LUA AVG Hep 3600  ?- mircera 200 q2, last 4/22 ?- sensipar 60 mg tiw ?- last Hb 10.0 on 4/27 ? ?Assessment/Plan: ?Acute hypoxic and hypercarbic RF - improved w/ bipap, Mg, solumedrol, inhalers in ED. CXR showed bilat pulm edema. HD 5/2 with 2.5L removed. Improved, now on RA.  ?Clotted LUA AVG - IR placed TDC 5/2. For declot per IR ?today  ?ESRD - on HD TTS.  HD today.  ?HTN/ vol - BP remains elevated. Got slightly under EDW on. Challenge with next HD.  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  ? ?Lynnda Child PA-C ?[facility] ?09/10/2021,9:13 AM ? ? ? ? ? ? ? ?

## 2021-09-10 NOTE — Progress Notes (Signed)
?  Progress Note ? ? ?Patient: Kathleen [name] ZOX:[id number] DOB: [birth year] DOA: 09/07/2021     2 ?DOS: the patient was seen and examined on 09/10/2021 ?  ?Brief hospital course: ?Mrs. Guedes was admitted to the hospital with the working diagnosis of respiratory failure due to volume overload in the setting of ESRD on HD.  ? ?61 yo female with the past medical history of ESRD on HD, lupus, seizures and COPD who presented with respiratory distress. Patient not compliant with fluid restriction at home. In the Ed she was in respiratory distress and required non invasive mechanical ventilation. Her blood pressure 149.93, RR 15, positive JVD, diffuse wheezing bilaterally, heart with S1 and S2 present and rhythmic, abdomen not distended and positive lower extremity edema.  ? ?Chest radiograph with right rotation, positive cardiomegaly, bilateral interstitial infiltrates, lower lobes and right upper lobe.  ? ?EKG 113 bpm, normal axis and normal intervals, sinus rhythm with poor R wave progression, no significant ST segment or T wave changes. ? ?Patient evaluated by nephrology, needing declotting left upper extremity dialysis fistula.  ?Underwent urgent right IJ tunneled HD catheter placement and posteriorly hemodialysis. ? ?Patient was liberated from non invasive mechanical ventilation.  ?Pending declotting of AV fistula.  ? ?Assessment and Plan: ?* Acute respiratory failure with hypoxia and hypercapnia (HCC) ?Respiratory failure has improved. ?Follow up VBG with pH 7,39 and Pco2 46,9 ? ?Patient out of bed and ambulating ?Continue oxymetry monitoring.  ?Her oxymetry today is 100% on room air.  ? ?ESRD (end stage renal disease) ([place]) ?Volume overload and acute pulmonary edema due to ESRD. ?Her volume status has improved after renal replacement therapy.  ?Ultrafiltration 2,468 ml. ? ?Access  IJ HD cathter on the right IJ ?05/04 successful declotting left upper extremity fistula.  ?Follow up with nephrology for further  ultrafiltration.   ? ?COPD with acute exacerbation ([place]) ?On bronchodilator therapy. ? ?She is out of bed and ambulating, continue oxymetry monitoring ? ?No clinical signs of pneumonia, will discontinue antibiotic therapy.  ? ?Acute on chronic combined systolic and diastolic CHF (congestive heart failure) ([place]) ?Preserved LV systolic function, continue blood pressure monitoring ?Ultrafiltration per HD. ? ? ?Lupus ([place]) ?Continue Plaquenil ? ?Hypertension ?Blood pressure improved, plan to continue with amlodipine, and carvedilol  ?Continue uncontrolled hypertension, will add hydralazine 50 mg po bid. ?Continue ultrafiltration on HD  ? ? ? ? ?  ? ?Subjective: Patient is feeling better, had HD and declotting of AV fistula  ? ?Physical Exam: ?Vitals:  ? 09/10/21 1525 09/10/21 1535 09/10/21 1548 09/10/21 1601  ?BP: (!) 174/105 (!) 174/96 (!) 174/102 (!) 168/102  ?Pulse: (!) 101 84 87   ?Resp: (!) 21 (!) '21 20 18  '$ ?Temp:    98.2 ?F (36.8 ?C)  ?TempSrc:    Oral  ?SpO2: 100% 100% 100% 100%  ?Weight:      ? ?Neurology awake and alert ?ENT with no pallor ?Cardiovascular with S1 and S2 present and rhythmic ?Respiratory with no rales or wheezing ?Abdomen not distended ?No lower extremity edema  ?Data Reviewed: ? ? ? ?Family Communication: no family at the bedside  ? ?Disposition: ?Status is: Inpatient ?Remains inpatient appropriate because: heart failure  ? Planned Discharge Destination: Home ? ? ? ? ?Author: ?Tawni Millers, MD ?09/10/2021 4:51 PM ? ?For on call review [email or web address].  ?

## 2021-09-10 NOTE — Progress Notes (Signed)
Mobility Specialist Progress Note: ? ? 09/10/21 1240  ?Mobility  ?Activity Ambulated independently in hallway  ?Level of Assistance Standby assist, set-up cues, supervision of patient - no hands on  ?Assistive Device None  ?Distance Ambulated (ft) 470 ft  ?Activity Response Tolerated well  ?$Mobility charge 1 Mobility  ? ?Pt asx during session. Back in room with all needs met.  ? ?Nelta Numbers ?Acute Rehab ?Phone: [phone] ?Office Phone: [phone] ? ?

## 2021-09-11 ENCOUNTER — Other Ambulatory Visit (HOSPITAL_COMMUNITY): Payer: Self-pay

## 2021-09-11 DIAGNOSIS — I5043 Acute on chronic combined systolic (congestive) and diastolic (congestive) heart failure: Secondary | ICD-10-CM | POA: Diagnosis not present

## 2021-09-11 DIAGNOSIS — J9601 Acute respiratory failure with hypoxia: Secondary | ICD-10-CM | POA: Diagnosis not present

## 2021-09-11 DIAGNOSIS — J441 Chronic obstructive pulmonary disease with (acute) exacerbation: Secondary | ICD-10-CM | POA: Diagnosis not present

## 2021-09-11 DIAGNOSIS — N186 End stage renal disease: Secondary | ICD-10-CM | POA: Diagnosis not present

## 2021-09-11 MED ORDER — ENSURE ENLIVE PO LIQD
237.0000 mL | Freq: Two times a day (BID) | ORAL | 0 refills | Status: AC
  Filled 2021-09-11: qty 14220, 30d supply, fill #0

## 2021-09-11 MED ORDER — HYDRALAZINE HCL 50 MG PO TABS
50.0000 mg | ORAL_TABLET | Freq: Two times a day (BID) | ORAL | 0 refills | Status: DC
  Filled 2021-09-11: qty 60, 30d supply, fill #0

## 2021-09-11 MED ORDER — ACETAMINOPHEN 325 MG PO TABS
ORAL_TABLET | ORAL | Status: AC
  Filled 2021-09-11: qty 2

## 2021-09-11 NOTE — Progress Notes (Signed)
Mobility Specialist Progress Note: ? ? 09/11/21 1015  ?Mobility  ?Activity Ambulated independently in hallway  ?Level of Assistance Independent  ?Assistive Device None  ?Distance Ambulated (ft) 470 ft  ?Activity Response Tolerated well  ?$Mobility charge 1 Mobility  ? ?Pt agreeable to mobility session. Required no physical assistance. Asx throughout session. Pt back in room with all needs met.  ? ?Nelta Numbers ?Acute Rehab ?Phone: [phone] ?Office Phone: [phone] ? ?

## 2021-09-11 NOTE — Progress Notes (Signed)
Mobility Specialist Progress Note: ? ? 09/11/21 1305  ?Mobility  ?Activity Ambulated independently in room  ?Level of Assistance Independent  ?Assistive Device None  ?Distance Ambulated (ft) 200 ft  ?Activity Response Tolerated well  ?$Mobility charge 1 Mobility  ? ?Pt asx during ambulation. Left in room getting ready for d/c. ? ?Nelta Numbers ?Acute Rehab ?Phone: [phone] ?Office Phone: [phone] ? ?

## 2021-09-11 NOTE — Progress Notes (Signed)
?[facility] ?Progress Note  ? ?Subjective:    ?Patient seen and examined at bedside. Noted dry blood at new HD catheter site. No active bleeding noted. S/p LUE AVG declot 5/4 in IR. She denies SOB, CP, and NV. Tolerated yesterday's HD with net UF 2.4L. Spoke to [employer], plan for discharge today. ? ?Objective ?Vitals:  ? 09/10/21 2015 09/11/21 5053 09/11/21 9767 09/11/21 3419  ?BP:  120/75 (!) 160/99   ?Pulse:  82 85 94  ?Resp:  '18 19 16  '$ ?Temp:  98.3 ?F (36.8 ?C) 98.2 ?F (36.8 ?C)   ?TempSrc:  Oral Oral   ?SpO2: 100% 100% 100% 100%  ?Weight:  50.3 kg    ? ?Physical Exam ?General: Alert, Nad ?Heart: RRR No m,r,g,  ?Lungs: Clear poteriorly, no wheeze, rales, or rhonchi ?Abdomen: soft non-tender ?Extremities: No LE edema  ?Dialysis Access: LUE AVG no bruit; R IJ TDC in place-noted dry blood at cath site, no active bleeding ? ?Filed Weights  ? 09/10/21 0817 09/10/21 1140 09/11/21 0316  ?Weight: 56.3 kg 53.8 kg 50.3 kg  ? ? ?Intake/Output Summary (Last 24 hours) at 09/11/2021 1039 ?Last data filed at 09/11/2021 0800 ?Gross per 24 hour  ?Intake 1680 ml  ?Output 2668 ml  ?Net -988 ml  ? ? ?Additional Objective ?Labs: ?Basic Metabolic Panel: ?Recent Labs  ?Lab 09/08/21 ?0241 09/08/21 ?0500 09/09/21 ?0430 09/10/21 ?0231  ?NA 142 138 135 136  ?K 4.3 3.9 4.9 4.8  ?CL 104 101 98 96*  ?CO2 18* '23 26 27  '$ ?GLUCOSE 123* 124* 100* 111*  ?BUN 28* 26* 31* 55*  ?CREATININE 9.88* 9.74* 6.09* 8.30*  ?CALCIUM 9.1 8.8* 8.9 9.2  ?PHOS 2.9  --  3.1  --   ? ?Liver Function Tests: ?Recent Labs  ?Lab 09/07/21 ?1955 09/08/21 ?0241 09/09/21 ?0430  ?AST 51*  --   --   ?ALT 14  --   --   ?ALKPHOS 63  --   --   ?BILITOT 0.7  --   --   ?PROT 7.0  --   --   ?ALBUMIN 3.1* 3.1* 3.0*  ? ?No results for input(s): LIPASE, AMYLASE in the last 168 hours. ?CBC: ?Recent Labs  ?Lab 09/07/21 ?1955 09/07/21 ?2008 09/07/21 ?2243 09/08/21 ?0500 09/09/21 ?0430  ?WBC 15.8*  --   --  5.3 9.8  ?NEUTROABS 6.3  --   --   --  8.0*  ?HGB 10.7*   < > 11.9*  11.0* 10.5*  ?HCT 39.0   < > 35.0* 38.6 35.0*  ?MCV 97.3  --   --  94.8 90.2  ?PLT 295  --   --  172 185  ? < > = values in this interval not displayed.  ? ?Blood Culture ?   ?Component Value Date/Time  ? SDES TISSUE WRIST LEFT SYNOVIAL 05/27/2021 1906  ? SDES WRIST LEFT SYNOVIAL 05/27/2021 1906  ? [place] IN A CUP 05/27/2021 1906  ? [place] SWABS 05/27/2021 1906  ? CULT  05/27/2021 1906  ?  RARE PROPIONIBACTERIUM ACNES ?Standardized susceptibility testing for this organism is not available. ?Performed at [facility], [place] [address] [place], [place] [address] ?  ? CULT  05/27/2021 1906  ?  No growth aerobically or anaerobically. ?Performed at [facility], [place] [address] [place], [place] [address] ?  ? REPTSTATUS 06/01/2021 FINAL 05/27/2021 1906  ? REPTSTATUS 06/01/2021 FINAL 05/27/2021 1906  ? ? ?Cardiac Enzymes: ?No results for input(s): CKTOTAL, CKMB, CKMBINDEX, TROPONINI in  the last 168 hours. ?CBG: ?No results for input(s): GLUCAP in the last 168 hours. ?Iron Studies: No results for input(s): IRON, TIBC, TRANSFERRIN, FERRITIN in the last 72 hours. ?Lab Results  ?Component Value Date  ? INR 1.04 07/04/2017  ? INR 1.06 07/12/2016  ? INR 1.42 06/28/2016  ? ?Studies/Results: ?IR US Guide Vasc Access Left ? ?Result Date: 09/10/2021 ?INDICATION: Occluded left upper arm loop AV graft EXAM: ULTRASOUND GUIDANCE FOR VASCULAR ACCESS LEFT UPPER ARM LOOP AV GRAFT THROMBO LYSIS, THROMBECTOMY, AND VENOUS ANGIOPLASTY FOR SUCCESSFUL DECLOT PROCEDURE MEDICATIONS: 1% lidocaine local ANESTHESIA/SEDATION: Moderate (conscious) sedation was employed during this procedure. A total of Versed 2.0 mg and Fentanyl 75 mcg was administered intravenously by the radiology nurse. Total intra-service moderate Sedation Time: 35 minutes. The patient's level of consciousness and vital signs were monitored continuously by radiology nursing throughout the procedure under my direct supervision. FLUOROSCOPY: Radiation Exposure  Index (as provided by the fluoroscopic device): 8 mGy Kerma COMPLICATIONS: None immediate. PROCEDURE: Informed written consent was obtained from the patient after a thorough discussion of the procedural risks, benefits and alternatives. All questions were addressed. Maximal Sterile Barrier Technique was utilized including caps, mask, sterile gowns, sterile gloves, sterile drape, hand hygiene and skin antiseptic. A timeout was performed prior to the initiation of the procedure. Under sterile conditions and local anesthesia, ultrasound micropuncture access performed of the arterial limb of the graft at 2 separate locations. Proximal 7 French sheath inserted directed to the graft apex. Six French sheath inserted directed to the arterial anastomosis. Contrast injection confirms thrombosis of the graft. Kumpe catheter and Bentson guidewire advanced into the central veins. Initial central venogram confirms patency of the axillary, subclavian and innominate veins. SVC patent. No central stenosis or occlusion. Pull-back venogram confirms thrombotic occlusion of the venous anastomosis and throughout the graft. 3000 units heparin instilled. 2 mg tPA also instilled throughout the thrombosed graft. Mechanical thrombectomy performed with the cleaner device. Graft inflow re-established by passing a 5 [place] Fogarty catheter across the arterial anastomosis twice. 7 mm overlapping angioplasty also performed of the venous anastomosis and throughout the venous limb of the graft in a retrograde fashion. Following this, there is restore of graft flow. Both sheaths were back bled and syringe aspirated. Final shuntogram performed. Following the declot intervention, the graft is widely patent with brisk venous outflow. No reflux or evidence of elevated venous pressures. No residual intragraft stenosis or thrombus. Patent venous outflow into the central veins. Access removed. Hemostasis obtained with pursestring sutures at both sites.  There was return of a strong palpable pulse. Patient tolerated the procedure well. No immediate complication. IMPRESSION: Successful left upper arm AV loop graft declot procedure as above. ACCESS: This access remains amenable to future percutaneous interventions as clinically indicated. Electronically Signed   By: Jerilynn Mages.  Shick M.D.   On: 09/10/2021 16:13  ? ?IR THROMBECTOMY AV FISTULA W/THROMBOLYSIS/PTA INC/SHUNT/IMG LEFT ? ?Result Date: 09/10/2021 ?INDICATION: Occluded left upper arm loop AV graft EXAM: ULTRASOUND GUIDANCE FOR VASCULAR ACCESS LEFT UPPER ARM LOOP AV GRAFT THROMBO LYSIS, THROMBECTOMY, AND VENOUS ANGIOPLASTY FOR SUCCESSFUL DECLOT PROCEDURE MEDICATIONS: 1% lidocaine local ANESTHESIA/SEDATION: Moderate (conscious) sedation was employed during this procedure. A total of Versed 2.0 mg and Fentanyl 75 mcg was administered intravenously by the radiology nurse. Total intra-service moderate Sedation Time: 35 minutes. The patient's level of consciousness and vital signs were monitored continuously by radiology nursing throughout the procedure under my direct supervision. FLUOROSCOPY: Radiation Exposure Index (as provided by the fluoroscopic device): 8  mGy Kerma COMPLICATIONS: None immediate. PROCEDURE: Informed written consent was obtained from the patient after a thorough discussion of the procedural risks, benefits and alternatives. All questions were addressed. Maximal Sterile Barrier Technique was utilized including caps, mask, sterile gowns, sterile gloves, sterile drape, hand hygiene and skin antiseptic. A timeout was performed prior to the initiation of the procedure. Under sterile conditions and local anesthesia, ultrasound micropuncture access performed of the arterial limb of the graft at 2 separate locations. Proximal 7 French sheath inserted directed to the graft apex. Six French sheath inserted directed to the arterial anastomosis. Contrast injection confirms thrombosis of the graft. Kumpe catheter and  Bentson guidewire advanced into the central veins. Initial central venogram confirms patency of the axillary, subclavian and innominate veins. SVC patent. No central stenosis or occlusion. Pull-back venogra

## 2021-09-11 NOTE — TOC Transition Note (Signed)
Transition of Care (TOC) - CM/SW Discharge Note ? ? ?Patient Details  ?Name: Kathleen [name] ?MRN: [id number] ?Date of Birth: 1961-04-05 ? ?Transition of Care (TOC) CM/SW Contact:  ?Zenon Mayo, RN ?Phone Number: ?09/11/2021, 12:17 PM ? ? ?Clinical Narrative:    ?Patient is for dc today, NCM spoke with her friend who will transport her home, he is at work and states he can be here in 3 hrs to transport her home.   ? ? ?Final next level of care: Home/Self Care ?Barriers to Discharge: Continued Medical Work up ? ? ?Patient Goals and CMS Choice ?Patient states their goals for this hospitalization and ongoing recovery are:: return home with brother ?  ?Choice offered to / list presented to : NA ? ?Discharge Placement ?  ?           ?  ?  ?  ?  ? ?Discharge Plan and Services ?  ?Discharge Planning Services: CM Consult ?Post Acute Care Choice: NA          ?  ?DME Agency: NA ?  ?  ?  ?HH Arranged: NA ?  ?  ?  ?  ? ?Social Determinants of Health (SDOH) Interventions ?  ? ? ?Readmission Risk Interventions ? ?  09/09/2021  ? 12:39 PM  ?Readmission Risk Prevention Plan  ?Transportation Screening Complete  ?Medication Review Press photographer) Complete  ?PCP or Specialist appointment within 3-5 days of discharge Complete  ?[place] or Home Care Consult Complete  ?SW Recovery Care/Counseling Consult Complete  ?Palliative Care Screening Not Applicable  ?[place] Not Applicable  ? ? ? ? ? ?

## 2021-09-11 NOTE — Progress Notes (Signed)
Occupational Therapy Treatment ?Patient Details ?Name: Samanthamarie Galuska ?MRN: [id number] ?DOB: [birth year] ?Today's Date: 09/11/2021 ? ? ?History of present illness Patient is a 61 y/o female who presents on 5/1 with SOB. Found to have acute respiratory failure with hypoxia and hypercapnia, acute on chronic CHF exacerbation, COPD exacerbation, clotted LUE fistula now s/p Rt IJ tunneled HD catheter 5/2. PMH includes chronic combined systolic/diastolic HF, Covid-19, ESRD on HD, COPD, HTN, migraine, lupus. ?  ?OT comments ? Patient continues to make steady progress towards goals in skilled OT session. Patient's session encompassed  pillbox test assessment. Functional cognition further assessed with The Pillbox Test: A Measure of Executive Functioning and Estimate of Medication Management. A straight pass/fail designation is determined by 3 or more errors of omission or misplacement on the task. The pt completed the test with less than 3 errors. However, patient requiring reassurance and cues when reading through medication. Patient would not attempt to fill out pill boxes independently without OT stating if she was correct or mistaken. Patient with 1 error and needing 12 minutes to complete 1 weeks worth of medication. OT recommending patient get a pillbox at home or have someone help her with medications due to high risk for errors if completing independently. Patient in agreement, and has a friend who helped her last time with her medications when she left the hospital. Patient also educated on "pill packs" to ensure safety with medications. No further acute OT needs identified at this time; OT signing off.   ? ?Recommendations for follow up therapy are one component of a multi-disciplinary discharge planning process, led by the attending physician.  Recommendations may be updated based on patient status, additional functional criteria and insurance authorization. ?   ?Follow Up Recommendations ? No OT follow up  ?   ?Assistance Recommended at Discharge PRN  ?Patient can return home with the following ? Direct supervision/assist for medications management;Assist for transportation;A little help with bathing/dressing/bathroom;Help with stairs or ramp for entrance;Direct supervision/assist for financial management;Assistance with cooking/housework ?  ?Equipment Recommendations ? None recommended by OT  ?  ?Recommendations for Other Services   ? ?  ?Precautions / Restrictions Precautions ?Precautions: Fall ?Restrictions ?Weight Bearing Restrictions: No  ? ? ?  ? ?Mobility Bed Mobility ?  ?  ?  ?  ?  ?  ?  ?General bed mobility comments: Sitting EOB upon arrival and exit ?  ? ?Transfers ?  ?  ?  ?  ?  ?  ?  ?  ?  ?  ?  ?  ?Balance   ?  ?  ?  ?  ?  ?  ?  ?  ?  ?  ?  ?  ?  ?  ?  ?  ?  ?  ?   ? ?ADL either performed or assessed with clinical judgement  ? ?ADL Overall ADL's : At baseline ?  ?  ?  ?  ?  ?  ?  ?  ?  ?  ?  ?  ?  ?  ?  ?  ?  ?  ?  ?General ADL Comments: Session focus on pillbox test ?  ? ?Extremity/Trunk Assessment   ?  ?  ?  ?  ?  ? ?Vision   ?  ?  ?Perception   ?  ?Praxis   ?  ? ?Cognition Arousal/Alertness: Awake/alert ?Behavior During Therapy: [facility] for tasks assessed/performed ?Overall Cognitive Status: No family/caregiver present to determine baseline cognitive  functioning ?  ?  ?  ?  ?  ?  ?  ?  ?  ?  ?  ?  ?  ?  ?  ?  ?General Comments: Patient completing pillbox test with patient, needing step by step cues and reassurance to complete ?  ?  ?   ?Exercises   ? ?  ?Shoulder Instructions   ? ? ?  ?General Comments    ? ? ?Pertinent Vitals/ Pain       Pain Assessment ?Pain Assessment: No/denies pain ? ?Home Living   ?  ?  ?  ?  ?  ?  ?  ?  ?  ?  ?  ?  ?  ?  ?  ?  ?  ?  ? ?  ?Prior Functioning/Environment    ?  ?  ?  ?   ? ?Frequency ? Min 2X/week  ? ? ? ? ?  ?Progress Toward Goals ? ?OT Goals(current goals can now be found in the care plan section) ? Progress towards OT goals: Progressing toward goals ? ?Acute Rehab OT  Goals ?Patient Stated Goal: to go home ?OT Goal Formulation: With patient ?Time For Goal Achievement: 09/23/21 ?Potential to Achieve Goals: Good  ?Plan Discharge plan remains appropriate   ? ?Co-evaluation ? ? ?   ?  ?  ?  ?  ? ?  ?AM-PAC OT "6 Clicks" Daily Activity     ?Outcome Measure ? ? Help from another person eating meals?: None ?Help from another person taking care of personal grooming?: None ?Help from another person toileting, which includes using toliet, bedpan, or urinal?: None ?Help from another person bathing (including washing, rinsing, drying)?: A Little ?Help from another person to put on and taking off regular upper body clothing?: None ?Help from another person to put on and taking off regular lower body clothing?: None ?6 Click Score: 23 ? ?  ?End of Session Equipment Utilized During Treatment: Other (comment) (Pill box) ? ?OT Visit Diagnosis: Unsteadiness on feet (R26.81) ?  ?Activity Tolerance Patient tolerated treatment well ?  ?Patient Left in bed;with call bell/phone within reach ?  ?Nurse Communication Mobility status ?  ? ?   ? ?Time: 0981-1914 ?OT Time Calculation (min): 24 min ? ?Charges: OT General Charges ?$OT Visit: 1 Visit ?OT Treatments ?$Self Care/Home Management : 23-37 mins ? ?Pollyann Glen E. Itzy Adler, OTR/L ?Acute Rehabilitation Services ?[phone] ?[phone]  ? ?Pollyann Glen Crescentia Boutwell ?09/11/2021, 12:52 PM ?

## 2021-09-11 NOTE — Progress Notes (Signed)
Pt to d/c today. Contacted [facility] and spoke to [facility] advised of pt's d/c today and that pt will resume care tomorrow.  ? ?Melven Sartorius ?Renal Navigator ?[phone] ?

## 2021-09-11 NOTE — Discharge Summary (Addendum)
Physician Discharge Summary   Patient: Kathleen [name] MRN: [id number] DOB: Apr 13, 1961  Admit date:     09/07/2021  Discharge date: 09/11/21  Discharge Physician: York Ram Mairi Stagliano   PCP: Hildred Priest, NP   Recommendations at discharge:    Continue renal replacement therapy tomorrow as outpatient. Added hydralazine 50 mg po bid.  Patient has a right IJ tunneled HD cathter in place.   Discharge Diagnoses: Principal Problem:   Acute respiratory failure with hypoxia and hypercapnia (HCC) Active Problems:   ESRD (end stage renal disease) (HCC)   COPD with acute exacerbation (HCC)   Acute on chronic combined systolic and diastolic CHF (congestive heart failure) (HCC)   Lupus (HCC)   Hypertension  Resolved Problems:   * No resolved hospital problems. [facility] Course: Kathleen [name] was admitted to the hospital with the working diagnosis of respiratory failure due to volume overload in the setting of ESRD on HD.   61 yo female with the past medical history of ESRD on HD, lupus, seizures and COPD who presented with respiratory distress. Patient not compliant with fluid restriction at home. In the Ed she was in respiratory distress and required non invasive mechanical ventilation. Her blood pressure 149.93, RR 15, positive JVD, diffuse wheezing bilaterally, heart with S1 and S2 present and rhythmic, abdomen not distended and positive lower extremity edema.   Chest radiograph with right rotation, positive cardiomegaly, bilateral interstitial infiltrates, lower lobes and right upper lobe.   EKG 113 bpm, normal axis and normal intervals, sinus rhythm with poor R wave progression, no significant ST segment or T wave changes.  Patient evaluated by nephrology, needing declotting left upper extremity dialysis fistula.  Underwent urgent right IJ tunneled HD catheter placement and posteriorly hemodialysis.  Patient was liberated from non invasive mechanical ventilation.  Pending  declotting of AV fistula.   Assessment and Plan: * Acute respiratory failure with hypoxia and hypercapnia (HCC) Respiratory failure has improved. Follow up VBG with pH 7,39 and Pco2 46,9  Patient out of bed and ambulating Continue oxymetry monitoring.  Her oxymetry today is 100% on room air.   ESRD (end stage renal disease) (HCC) Volume overload and acute pulmonary edema due to ESRD. Her volume status has improved after renal replacement therapy and ultraafiltration.  Access  IJ HD cathter on the right IJ 05/04 successful declotting left upper extremity fistula.  Plan to continue renal replacement therapy tomorrow at the outpatient unit.    COPD with acute exacerbation (HCC) On bronchodilator therapy.  She is out of bed and ambulating, continue oxymetry monitoring  No clinical signs of pneumonia, will discontinue antibiotic therapy.   Acute on chronic combined systolic and diastolic CHF (congestive heart failure) (HCC) Preserved LV systolic function, echocardiogram with LV systolic function 60 to 65% with preserved RV systolic function. Moderate dilatation of the aortic root.   Continue carvedilol, and blood pressure control with amlodipine and hydralazine.   Ultrafiltration per HD.    Lupus (HCC) Continue Plaquenil  Hypertension Blood pressure improved, plan to continue with amlodipine, and carvedilol  Continue with hydralazine 50 mg po bid. Continue ultrafiltration on HD   Blood pressure at discharge has improved.          Consultants: nephrology and IR Procedures performed: right IJ HD tunneled cathter, right upper extremity fistula declotting   Disposition: Home Diet recommendation:  Cardiac diet DISCHARGE MEDICATION: Allergies as of 09/11/2021   No Known Allergies      Medication List  STOP taking these medications    ceFAZolin 2-4 GM/100ML-% IVPB Commonly known as: ANCEF       TAKE these medications    acetaminophen 325 MG  tablet Commonly known as: TYLENOL Take 650 mg by mouth every 6 (six) hours as needed for pain.   amLODipine 10 MG tablet Commonly known as: NORVASC Take 1 tablet (10 mg total) by mouth daily.   carvedilol 3.125 MG tablet Commonly known as: COREG Take 1 tablet (3.125 mg total) by mouth 2 (two) times daily with a meal.   feeding supplement Liqd Take 237 mLs by mouth 2 (two) times daily between meals.   hydrALAZINE 50 MG tablet Commonly known as: APRESOLINE Take 1 tablet (50 mg total) by mouth every 12 (twelve) hours.   hydroxychloroquine 200 MG tablet Commonly known as: PLAQUENIL Take 200 mg by mouth daily.   multivitamin Tabs tablet Take 1 tablet by mouth daily.   sevelamer carbonate 800 MG tablet Commonly known as: RENVELA Take 800-2,400 mg by mouth See admin instructions. 2400 mg with meals 800 mg with snacks        Follow-up Information     Hildred Priest, NP Follow up on 09/16/2021.   Specialty: Internal Medicine Why: will Dr. Stephens November PA, on 5/10 at 1:30 Contact information: [address] [place] [address] [phone]                Discharge Exam: Ceasar Mons Weights   09/10/21 0817 09/10/21 1140 09/11/21 0316  Weight: 56.3 kg 53.8 kg 50.3 kg   BP (!) 160/99 (BP Location: Right Arm)   Pulse 94   Temp 98.2 F (36.8 C) (Oral)   Resp 16   Wt 50.3 kg   SpO2 100%   BMI 19.05 kg/m   Patient is feeling better, no chest pain or dyspnea, she had mild bleed at the cathter site last night, this am no signs of ongoing bleeding  Neurology awake and alert ENT with no pallor Cardiovascular with S1 and S2 present and rhythmic with no gallops, rubs or murmurs No JVD No lower extremity edema Respiratory with no rales or wheezing Abdomen not distended Dry blood at the right cathter site, no hematoma noted.   Condition at discharge: stable  The results of significant diagnostics from this hospitalization (including imaging,  microbiology, ancillary and laboratory) are listed below for reference.   Imaging Studies: IR Fluoro Guide CV Line Right  Result Date: 09/08/2021 INDICATION: 61 year old female referred for hemodialysis catheter for urgent dialysis EXAM: TUNNELED CENTRAL VENOUS HEMODIALYSIS CATHETER PLACEMENT WITH ULTRASOUND AND FLUOROSCOPIC GUIDANCE MEDICATIONS: 1 g Ancef. The antibiotic was given in an appropriate time interval prior to skin puncture. ANESTHESIA/SEDATION: Moderate (conscious) sedation was employed during this procedure. A total of Versed 1.0 mg and Fentanyl 25 mcg was administered intravenously by the radiology nurse. Total intra-service moderate Sedation Time: 19 minutes. The patient's level of consciousness and vital signs were monitored continuously by radiology nursing throughout the procedure under my direct supervision. FLUOROSCOPY: Radiation Exposure Index (as provided by the fluoroscopic device): 12.1 mGy Kerma COMPLICATIONS: None PROCEDURE: Informed written consent was obtained from the patient after a discussion of the risks, benefits, and alternatives to treatment. Questions regarding the procedure were encouraged and answered. The right neck and chest were prepped with chlorhexidine in a sterile fashion, and a sterile drape was applied covering the operative field. Maximum barrier sterile technique with sterile gowns and gloves were used for the procedure. A timeout was  performed prior to the initiation of the procedure. Ultrasound survey was performed. The right internal jugular vein was confirmed to be patent, with images stored and sent to PACS. Micropuncture kit was utilized to access the right internal jugular vein under direct, real-time ultrasound guidance after the overlying soft tissues were anesthetized with 1% lidocaine with epinephrine. Stab incision was made with 11 blade scalpel. Microwire was passed centrally. The microwire was then marked to measure appropriate internal catheter  length. External tunneled length was estimated. A total tip to cuff length of 19 cm was selected. 035 guidewire was advanced to the level of the IVC. Skin and subcutaneous tissues of chest wall below the clavicle were generously infiltrated with 1% lidocaine for local anesthesia. A small stab incision was made with 11 blade scalpel. The selected hemodialysis catheter was tunneled in a retrograde fashion from the anterior chest wall to the venotomy incision. Serial dilation was performed and then a peel-away sheath was placed. The catheter was then placed through the peel-away sheath with tips ultimately positioned within the superior aspect of the right atrium. Final catheter positioning was confirmed and documented with a spot radiographic image. The catheter aspirates and flushes normally. The catheter was flushed with appropriate volume heparin dwells. The catheter exit site was secured with a 0-Prolene retention suture. Gel-Foam slurry was infused into the soft tissue tract. The venotomy incision was closed Derma bond and sterile dressing. Dressings were applied at the chest wall. Patient tolerated the procedure well and remained hemodynamically stable throughout. No complications were encountered and no significant blood loss encountered. IMPRESSION: Status post right IJ tunneled hemodialysis catheter Signed, Yvone Neu. Reyne Dumas, RPVI [facility] Radiology Electronically Signed   By: Gilmer Mor D.O.   On: 09/08/2021 13:03   IR US Guide Vasc Access Left  Result Date: 09/10/2021 INDICATION: Occluded left upper arm loop AV graft EXAM: ULTRASOUND GUIDANCE FOR VASCULAR ACCESS LEFT UPPER ARM LOOP AV GRAFT THROMBO LYSIS, THROMBECTOMY, AND VENOUS ANGIOPLASTY FOR SUCCESSFUL DECLOT PROCEDURE MEDICATIONS: 1% lidocaine local ANESTHESIA/SEDATION: Moderate (conscious) sedation was employed during this procedure. A total of Versed 2.0 mg and Fentanyl 75 mcg was  administered intravenously by the radiology nurse. Total intra-service moderate Sedation Time: 35 minutes. The patient's level of consciousness and vital signs were monitored continuously by radiology nursing throughout the procedure under my direct supervision. FLUOROSCOPY: Radiation Exposure Index (as provided by the fluoroscopic device): 8 mGy Kerma COMPLICATIONS: None immediate. PROCEDURE: Informed written consent was obtained from the patient after a thorough discussion of the procedural risks, benefits and alternatives. All questions were addressed. Maximal Sterile Barrier Technique was utilized including caps, mask, sterile gowns, sterile gloves, sterile drape, hand hygiene and skin antiseptic. A timeout was performed prior to the initiation of the procedure. Under sterile conditions and local anesthesia, ultrasound micropuncture access performed of the arterial limb of the graft at 2 separate locations. Proximal 7 French sheath inserted directed to the graft apex. Six French sheath inserted directed to the arterial anastomosis. Contrast injection confirms thrombosis of the graft. Kumpe catheter and Bentson guidewire advanced into the central veins. Initial central venogram confirms patency of the axillary, subclavian and innominate veins. SVC patent. No central stenosis or occlusion. Pull-back venogram confirms thrombotic occlusion of the venous anastomosis and throughout the graft. 3000 units heparin instilled. 2 mg tPA also instilled throughout the thrombosed graft. Mechanical thrombectomy performed with the cleaner device. Graft inflow re-established by passing a 5 [place] Fogarty catheter across the arterial  anastomosis twice. 7 mm overlapping angioplasty also performed of the venous anastomosis and throughout the venous limb of the graft in a retrograde fashion. Following this, there is restore of graft flow. Both sheaths were back bled and syringe aspirated. Final shuntogram performed. Following the  declot intervention, the graft is widely patent with brisk venous outflow. No reflux or evidence of elevated venous pressures. No residual intragraft stenosis or thrombus. Patent venous outflow into the central veins. Access removed. Hemostasis obtained with pursestring sutures at both sites. There was return of a strong palpable pulse. Patient tolerated the procedure well. No immediate complication. IMPRESSION: Successful left upper arm AV loop graft declot procedure as above. ACCESS: This access remains amenable to future percutaneous interventions as clinically indicated. Electronically Signed   By: Judie Petit.  Shick M.D.   On: 09/10/2021 16:13   IR US Guide Vasc Access Right  Result Date: 09/08/2021 INDICATION: 61 year old female referred for hemodialysis catheter for urgent dialysis EXAM: TUNNELED CENTRAL VENOUS HEMODIALYSIS CATHETER PLACEMENT WITH ULTRASOUND AND FLUOROSCOPIC GUIDANCE MEDICATIONS: 1 g Ancef. The antibiotic was given in an appropriate time interval prior to skin puncture. ANESTHESIA/SEDATION: Moderate (conscious) sedation was employed during this procedure. A total of Versed 1.0 mg and Fentanyl 25 mcg was administered intravenously by the radiology nurse. Total intra-service moderate Sedation Time: 19 minutes. The patient's level of consciousness and vital signs were monitored continuously by radiology nursing throughout the procedure under my direct supervision. FLUOROSCOPY: Radiation Exposure Index (as provided by the fluoroscopic device): 12.1 mGy Kerma COMPLICATIONS: None PROCEDURE: Informed written consent was obtained from the patient after a discussion of the risks, benefits, and alternatives to treatment. Questions regarding the procedure were encouraged and answered. The right neck and chest were prepped with chlorhexidine in a sterile fashion, and a sterile drape was applied covering the operative field. Maximum barrier sterile technique with sterile gowns and gloves were used for the  procedure. A timeout was performed prior to the initiation of the procedure. Ultrasound survey was performed. The right internal jugular vein was confirmed to be patent, with images stored and sent to PACS. Micropuncture kit was utilized to access the right internal jugular vein under direct, real-time ultrasound guidance after the overlying soft tissues were anesthetized with 1% lidocaine with epinephrine. Stab incision was made with 11 blade scalpel. Microwire was passed centrally. The microwire was then marked to measure appropriate internal catheter length. External tunneled length was estimated. A total tip to cuff length of 19 cm was selected. 035 guidewire was advanced to the level of the IVC. Skin and subcutaneous tissues of chest wall below the clavicle were generously infiltrated with 1% lidocaine for local anesthesia. A small stab incision was made with 11 blade scalpel. The selected hemodialysis catheter was tunneled in a retrograde fashion from the anterior chest wall to the venotomy incision. Serial dilation was performed and then a peel-away sheath was placed. The catheter was then placed through the peel-away sheath with tips ultimately positioned within the superior aspect of the right atrium. Final catheter positioning was confirmed and documented with a spot radiographic image. The catheter aspirates and flushes normally. The catheter was flushed with appropriate volume heparin dwells. The catheter exit site was secured with a 0-Prolene retention suture. Gel-Foam slurry was infused into the soft tissue tract. The venotomy incision was closed Derma bond and sterile dressing. Dressings were applied at the chest wall. Patient tolerated the procedure well and remained hemodynamically stable throughout. No complications were encountered and no significant blood loss  encountered. IMPRESSION: Status post right IJ tunneled hemodialysis catheter Signed, Yvone Neu. Reyne Dumas, RPVI [facility] Inter[facility]  Radiology Specialists [facility] Radiology Electronically Signed   By: Gilmer Mor D.O.   On: 09/08/2021 13:03   DG CHEST PORT 1 VIEW  Result Date: 09/09/2021 CLINICAL DATA:  Respiratory distress. EXAM: PORTABLE CHEST 1 VIEW COMPARISON:  Sep 07, 2021. FINDINGS: Stable cardiomegaly. Interval placement of right internal jugular dialysis catheter with distal tip in expected position of cavoatrial junction. Left lung is clear. Minimal right basilar subsegmental atelectasis is noted with possible small right pleural effusion. Bony thorax is unremarkable. IMPRESSION: Minimal right basilar subsegmental atelectasis is noted with probable small right pleural effusion. Electronically Signed   By: Lupita Raider M.D.   On: 09/09/2021 10:22   DG Chest Port 1 View  Result Date: 09/07/2021 CLINICAL DATA:  Shortness of breath EXAM: PORTABLE CHEST 1 VIEW COMPARISON:  07/04/2021 FINDINGS: Mild cardiomegaly. Mild right greater than left interstitial opacity. No pneumothorax or sizable pleural effusion. No focal airspace consolidation. IMPRESSION: Mild cardiomegaly and mild right greater than left interstitial opacity, which may indicate mild pulmonary edema. Electronically Signed   By: Deatra Robinson M.D.   On: 09/07/2021 20:22   IR THROMBECTOMY AV FISTULA W/THROMBOLYSIS/PTA INC/SHUNT/IMG LEFT  Result Date: 09/10/2021 INDICATION: Occluded left upper arm loop AV graft EXAM: ULTRASOUND GUIDANCE FOR VASCULAR ACCESS LEFT UPPER ARM LOOP AV GRAFT THROMBO LYSIS, THROMBECTOMY, AND VENOUS ANGIOPLASTY FOR SUCCESSFUL DECLOT PROCEDURE MEDICATIONS: 1% lidocaine local ANESTHESIA/SEDATION: Moderate (conscious) sedation was employed during this procedure. A total of Versed 2.0 mg and Fentanyl 75 mcg was administered intravenously by the radiology nurse. Total intra-service moderate Sedation Time: 35 minutes. The patient's level of consciousness and vital signs were monitored continuously by radiology nursing throughout the procedure under  my direct supervision. FLUOROSCOPY: Radiation Exposure Index (as provided by the fluoroscopic device): 8 mGy Kerma COMPLICATIONS: None immediate. PROCEDURE: Informed written consent was obtained from the patient after a thorough discussion of the procedural risks, benefits and alternatives. All questions were addressed. Maximal Sterile Barrier Technique was utilized including caps, mask, sterile gowns, sterile gloves, sterile drape, hand hygiene and skin antiseptic. A timeout was performed prior to the initiation of the procedure. Under sterile conditions and local anesthesia, ultrasound micropuncture access performed of the arterial limb of the graft at 2 separate locations. Proximal 7 French sheath inserted directed to the graft apex. Six French sheath inserted directed to the arterial anastomosis. Contrast injection confirms thrombosis of the graft. Kumpe catheter and Bentson guidewire advanced into the central veins. Initial central venogram confirms patency of the axillary, subclavian and innominate veins. SVC patent. No central stenosis or occlusion. Pull-back venogram confirms thrombotic occlusion of the venous anastomosis and throughout the graft. 3000 units heparin instilled. 2 mg tPA also instilled throughout the thrombosed graft. Mechanical thrombectomy performed with the cleaner device. Graft inflow re-established by passing a 5 [place] Fogarty catheter across the arterial anastomosis twice. 7 mm overlapping angioplasty also performed of the venous anastomosis and throughout the venous limb of the graft in a retrograde fashion. Following this, there is restore of graft flow. Both sheaths were back bled and syringe aspirated. Final shuntogram performed. Following the declot intervention, the graft is widely patent with brisk venous outflow. No reflux or evidence of elevated venous pressures. No residual intragraft stenosis or thrombus. Patent venous outflow into the central veins. Access removed.  Hemostasis obtained with pursestring sutures at both sites. There was return of a strong palpable pulse.  Patient tolerated the procedure well. No immediate complication. IMPRESSION: Successful left upper arm AV loop graft declot procedure as above. ACCESS: This access remains amenable to future percutaneous interventions as clinically indicated. Electronically Signed   By: Judie Petit.  Shick M.D.   On: 09/10/2021 16:13    Microbiology: Results for orders placed or performed during the hospital encounter of 09/07/21  Resp Panel by RT-PCR (Flu A&B, Covid) Nasopharyngeal Swab     Status: None   Collection Time: 09/08/21  6:05 AM   Specimen: Nasopharyngeal Swab; Nasopharyngeal(NP) swabs in vial transport medium  Result Value Ref Range Status   SARS Coronavirus 2 by RT PCR NEGATIVE NEGATIVE Final    Comment: (NOTE) SARS-CoV-2 target nucleic acids are NOT DETECTED.  The SARS-CoV-2 RNA is generally detectable in upper respiratory specimens during the acute phase of infection. The lowest concentration of SARS-CoV-2 viral copies this assay can detect is 138 copies/mL. A negative result does not preclude SARS-Cov-2 infection and should not be used as the sole basis for treatment or other patient management decisions. A negative result may occur with  improper specimen collection/handling, submission of specimen other than nasopharyngeal swab, presence of viral mutation(s) within the areas targeted by this assay, and inadequate number of viral copies(<138 copies/mL). A negative result must be combined with clinical observations, patient history, and epidemiological information. The expected result is Negative.  Fact Sheet for Patients:  BloggerCourse.com  Fact Sheet for Healthcare Providers:  SeriousBroker.it  This test is no t yet approved or cleared by the [place] FDA and  has been authorized for detection and/or diagnosis of SARS-CoV-2 by FDA  under an Emergency Use Authorization (EUA). This EUA will remain  in effect (meaning this test can be used) for the duration of the COVID-19 declaration under Section 564(b)(1) of the Act, 21 U.S.C.section 360bbb-3(b)(1), unless the authorization is terminated  or revoked sooner.       Influenza A by PCR NEGATIVE NEGATIVE Final   Influenza B by PCR NEGATIVE NEGATIVE Final    Comment: (NOTE) The Xpert Xpress SARS-CoV-2/FLU/RSV plus assay is intended as an aid in the diagnosis of influenza from Nasopharyngeal swab specimens and should not be used as a sole basis for treatment. Nasal washings and aspirates are unacceptable for Xpert Xpress SARS-CoV-2/FLU/RSV testing.  Fact Sheet for Patients: BloggerCourse.com  Fact Sheet for Healthcare Providers: SeriousBroker.it  This test is not yet approved or cleared by the [place] FDA and has been authorized for detection and/or diagnosis of SARS-CoV-2 by FDA under an Emergency Use Authorization (EUA). This EUA will remain in effect (meaning this test can be used) for the duration of the COVID-19 declaration under Section 564(b)(1) of the Act, 21 U.S.C. section 360bbb-3(b)(1), unless the authorization is terminated or revoked.  Performed at [facility], [address] [place], [place] [address]     Labs: CBC: Recent Labs  Lab 09/07/21 1955 09/07/21 2008 09/07/21 2243 09/08/21 0500 09/09/21 0430  WBC 15.8*  --   --  5.3 9.8  NEUTROABS 6.3  --   --   --  8.0*  HGB 10.7* 12.9 11.9* 11.0* 10.5*  HCT 39.0 38.0 35.0* 38.6 35.0*  MCV 97.3  --   --  94.8 90.2  PLT 295  --   --  172 185   Basic Metabolic Panel: Recent Labs  Lab 09/07/21 1955 09/07/21 2008 09/07/21 2243 09/08/21 0241 09/08/21 0500 09/09/21 0430 09/10/21 0231  NA 141   < > 139 142 138  135 136  K 4.7   < > 3.8 4.3 3.9 4.9 4.8  CL 105  --   --  104 101 98 96*  CO2 21*  --   --  18* 23 26 27   GLUCOSE  219*  --   --  123* 124* 100* 111*  BUN 23*  --   --  28* 26* 31* 55*  CREATININE 9.32*  --   --  9.88* 9.74* 6.09* 8.30*  CALCIUM 8.8*  --   --  9.1 8.8* 8.9 9.2  PHOS  --   --   --  2.9  --  3.1  --    < > = values in this interval not displayed.   Liver Function Tests: Recent Labs  Lab 09/07/21 1955 09/08/21 0241 09/09/21 0430  AST 51*  --   --   ALT 14  --   --   ALKPHOS 63  --   --   BILITOT 0.7  --   --   PROT 7.0  --   --   ALBUMIN 3.1* 3.1* 3.0*   CBG: No results for input(s): GLUCAP in the last 168 hours.  Discharge time spent: greater than 30 minutes.  Signed: Coralie Keens, MD [facility] 09/11/2021

## 2021-09-11 NOTE — Care Management Important Message (Signed)
Important Message ? ?Patient Details  ?Name: Kathleen [name] ?MRN: [id number] ?Date of Birth: [birth year] ? ? ?Medicare Important Message Given:  Yes ? ? ? ? ?Shelda Altes ?09/11/2021, 10:32 AM ?

## 2021-09-13 NOTE — TOC Transition Note (Signed)
Transition of care contact from inpatient facility ? ?Date of discharge: 09/11/21 ?Date of contact: 09/13/21 ?Method: Attempted Phone Call ?Spoke to: No Answer ? ?Tried calling patient contacted to discuss transition of care from recent inpatient hospitalization but patient did not pick up the phone. Unable to leave voicemail d/t mailbox being full.  ? ?Patient will follow up with her outpatient HD unit on: Patient received HD at [facility] Saturday 09/12/21. Plan for renal team to see patient during next routine rounds. ? ?Tobie Poet, NP ?  ?

## 2021-12-19 ENCOUNTER — Emergency Department (HOSPITAL_COMMUNITY)
Admission: EM | Admit: 2021-12-19 | Discharge: 2021-12-20 | Disposition: A | Discharge status: Home / Self Care | Payer: Medicare Other | Attending: Emergency Medicine | Admitting: Emergency Medicine

## 2021-12-19 ENCOUNTER — Encounter (HOSPITAL_COMMUNITY): Payer: Self-pay | Admitting: *Deleted

## 2021-12-19 ENCOUNTER — Other Ambulatory Visit: Payer: Self-pay

## 2021-12-19 DIAGNOSIS — Z992 Dependence on renal dialysis: Secondary | ICD-10-CM | POA: Diagnosis not present

## 2021-12-19 DIAGNOSIS — J449 Chronic obstructive pulmonary disease, unspecified: Secondary | ICD-10-CM | POA: Insufficient documentation

## 2021-12-19 DIAGNOSIS — Z79899 Other long term (current) drug therapy: Secondary | ICD-10-CM | POA: Insufficient documentation

## 2021-12-19 DIAGNOSIS — R1031 Right lower quadrant pain: Secondary | ICD-10-CM | POA: Diagnosis present

## 2021-12-19 DIAGNOSIS — I120 Hypertensive chronic kidney disease with stage 5 chronic kidney disease or end stage renal disease: Secondary | ICD-10-CM | POA: Diagnosis not present

## 2021-12-19 DIAGNOSIS — R4189 Other symptoms and signs involving cognitive functions and awareness: Secondary | ICD-10-CM

## 2021-12-19 DIAGNOSIS — R109 Unspecified abdominal pain: Secondary | ICD-10-CM

## 2021-12-19 DIAGNOSIS — Z20822 Contact with and (suspected) exposure to covid-19: Secondary | ICD-10-CM | POA: Insufficient documentation

## 2021-12-19 DIAGNOSIS — F17290 Nicotine dependence, other tobacco product, uncomplicated: Secondary | ICD-10-CM | POA: Diagnosis not present

## 2021-12-19 DIAGNOSIS — M545 Low back pain, unspecified: Secondary | ICD-10-CM | POA: Diagnosis not present

## 2021-12-19 DIAGNOSIS — N186 End stage renal disease: Secondary | ICD-10-CM | POA: Insufficient documentation

## 2021-12-19 DIAGNOSIS — R519 Headache, unspecified: Secondary | ICD-10-CM | POA: Insufficient documentation

## 2021-12-19 LAB — COMPREHENSIVE METABOLIC PANEL
ALT: 14 U/L (ref 0–44)
AST: 32 U/L (ref 15–41)
Albumin: 4 g/dL (ref 3.5–5.0)
Alkaline Phosphatase: 76 U/L (ref 38–126)
Anion gap: 14 (ref 5–15)
BUN: 13 mg/dL (ref 8–23)
CO2: 30 mmol/L (ref 22–32)
Calcium: 9.4 mg/dL (ref 8.9–10.3)
Chloride: 91 mmol/L — ABNORMAL LOW (ref 98–111)
Creatinine, Ser: 6.06 mg/dL — ABNORMAL HIGH (ref 0.44–1.00)
GFR, Estimated: 7 mL/min — ABNORMAL LOW (ref >60)
Glucose, Bld: 111 mg/dL — ABNORMAL HIGH (ref 70–99)
Potassium: 3.9 mmol/L (ref 3.5–5.1)
Sodium: 135 mmol/L (ref 135–145)
Total Bilirubin: 0.6 mg/dL (ref 0.3–1.2)
Total Protein: 8.8 g/dL — ABNORMAL HIGH (ref 6.5–8.1)

## 2021-12-19 LAB — CBC
HCT: 42.1 % (ref 36.0–46.0)
Hemoglobin: 13.2 g/dL (ref 12.0–15.0)
MCH: 28.6 pg (ref 26.0–34.0)
MCHC: 31.4 g/dL (ref 30.0–36.0)
MCV: 91.3 fL (ref 80.0–100.0)
Platelets: 198 10*3/uL (ref 150–400)
RBC: 4.61 MIL/uL (ref 3.87–5.11)
RDW: 18.5 % — ABNORMAL HIGH (ref 11.5–15.5)
WBC: 8.6 10*3/uL (ref 4.0–10.5)
nRBC: 0 % (ref 0.0–0.2)

## 2021-12-19 LAB — CBG MONITORING, ED: Glucose-Capillary: 99 mg/dL (ref 70–99)

## 2021-12-19 NOTE — ED Triage Notes (Addendum)
PT here c/o dizziness today after dialysis.  Vomited yesterday.  Difficult to obtain clear story from pt.  PT states she has to have a bm every 10 minutes.  Family member states pt has been increasingly paranoid and forgetful.  Family member suspects she is using drugs.    Call brother, Jackelyn Hoehn, with any questions.  [phone]

## 2021-12-19 NOTE — ED Provider Triage Note (Signed)
  Emergency Medicine Provider Triage Evaluation Note  MRN:  [id number]  Arrival date & time: 12/19/21    Medically screening exam initiated at 10:37 PM.   CC:   Dizziness   HPI:  Kathleen [name] is a 61 y.o. year-old female presents to the ED with chief complaint of dizziness. States that she has been vomiting and not feeling well since dialysis today.  Her SO thinks that she might be using drugs.  He states that she frequently stares off into space for a prolonged period of time and acts "funny".  History provided by patient. ROS:  -As included in HPI PE:   Vitals:   12/19/21 2135  BP: 115/84  Pulse: 94  Resp: 16  Temp: 98 F (36.7 C)  SpO2: 96%    Non-toxic appearing No respiratory distress  MDM:   I've ordered labs and imaging in triage to expedite lab/diagnostic workup.  Patient was informed that the remainder of the evaluation will be completed by another provider, this initial triage assessment does not replace that evaluation, and the importance of remaining in the ED until their evaluation is complete.    Montine Circle, PA-C 12/19/21 2238

## 2021-12-20 ENCOUNTER — Encounter (HOSPITAL_COMMUNITY): Payer: Self-pay | Admitting: Emergency Medicine

## 2021-12-20 ENCOUNTER — Emergency Department (HOSPITAL_COMMUNITY): Payer: Medicare Other

## 2021-12-20 LAB — RESP PANEL BY RT-PCR (FLU A&B, COVID) ARPGX2
Influenza A by PCR: NEGATIVE
Influenza B by PCR: NEGATIVE
SARS Coronavirus 2 by RT PCR: NEGATIVE

## 2021-12-20 MED ORDER — SUCRALFATE 1 G PO TABS: 1.0000 g | ORAL_TABLET | Freq: Three times a day (TID) | ORAL | 0 refills | Status: DC

## 2021-12-20 MED ORDER — ONDANSETRON HCL 4 MG PO TABS: 4.0000 mg | ORAL_TABLET | Freq: Three times a day (TID) | ORAL | 0 refills | Status: DC | PRN

## 2021-12-20 NOTE — ED Notes (Signed)
Pt provided ice water and crackers for PO challenge

## 2021-12-20 NOTE — ED Notes (Signed)
Bladder scan showed 0ml.  

## 2021-12-20 NOTE — ED Notes (Signed)
Pt ambulated to and from bathroom without assistance 

## 2021-12-20 NOTE — Discharge Instructions (Addendum)
It was a pleasure caring for you today in the emergency department.  Please return to the emergency department for any worsening or worrisome symptoms.   Please follow-up with primary care doctor regarding behavior changes over the past month

## 2021-12-20 NOTE — ED Provider Notes (Signed)
[facility] EMERGENCY DEPARTMENT Provider Note   CSN: [id number] Arrival date & time: 12/19/21  2021     History  Chief Complaint  Patient presents with   Abdominal Pain    Kathleen [name] is a 61 y.o. female.  Patient as above with significant medical history as below, including COPD, ESRD on HD Tuesday Thursday Saturday, HTN, lupus, seizures, TIA, prior suicide attempt who presents to the ED with complaint of nausea, vomiting, mental status changes per family bedside.  Patient reports that she began to feel nauseated, had some periumbilical abdominal discomfort during dialysis.  Vomited once or twice following dialysis.  Reports she did get the entire dialysis session.  She is having some intermittent periumbilical discomfort, some pain to her back and right lower quadrant.  No chest pain or dyspnea.  No fevers or chills.  Spouse does report that she vomited just after arrival to the emergency department.  No further episodes of emesis while she has been here.  Spouse concerned that possibly she is smoking some sort of illicit drug that she has been hiding from him.  Patient denies this.  Spouse also reports that she was previously on opiate pain medications but no longer takes them.  Denies any alcohol use.  Does continue to smoke cigarettes.  Spouse reports also the patient has become increasingly paranoid over the past month and a half.  Some behavior changes.  Not sleeping well.  Some personality changes.  Changes been gradual over the past 1.5 mos and have not acutely worsened in the past few days. Intermittent agitation.   Patient is a very poor historian, spouse is also poor historian   Past Medical History:  Diagnosis Date   Anemia    Arthritis    Cardiac arrest ([place])    Cellulitis    Cerebral vasculitis    COPD (chronic obstructive pulmonary disease) (HCC)    Elevated antinuclear antibody (ANA) level    Endometriosis    ESRD (end stage renal disease) on  dialysis ([place])    Gallstones    Hypertension    Lupus (HCC)    Migraine    PONV (postoperative nausea and vomiting)    Seizures ([place])    Sepsis ([place])    Sleep apnea    Suicide attempt ([place])    TB (tuberculosis)    Transient ischemic attack (TIA)    Vasculitis of skin     Past Surgical History:  Procedure Laterality Date   AV FISTULA PLACEMENT Left 07/04/2017   Procedure: ARTERIOVENOUS (AV) FISTULA CREATION;  Surgeon: Conrad [place], MD;  Location: [place];  Service: Vascular;  Laterality: Left;   AV FISTULA PLACEMENT Left 11/27/2019   Procedure: INSERTION OF ARTERIOVENOUS (AV) GORE-TEX GRAFT LEFT UPPER ARM;  Surgeon: Angelia Mould, MD;  Location: [place];  Service: Vascular;  Laterality: Left;   AV FISTULA PLACEMENT Left 07/29/2020   Procedure: INSERTION OF LEFT UPPER ARM ARTERIOVENOUS (AV) GORE-TEX GRAFT;  Surgeon: Angelia Mould, MD;  Location: [place];  Service: Vascular;  Laterality: Left;   BUBBLE STUDY  05/25/2021   Procedure: BUBBLE STUDY;  Surgeon: Geralynn Rile, MD;  Location: [place];  Service: Cardiovascular;;   CESAREAN SECTION     ESOPHAGOGASTRODUODENOSCOPY N/A 07/15/2016   Procedure: ESOPHAGOGASTRODUODENOSCOPY (EGD);  Surgeon: Laurence Spates, MD;  Location: [facility] ENDOSCOPY;  Service: Endoscopy;  Laterality: N/A;   I & D EXTREMITY Left 05/27/2021   Procedure: IRRIGATION AND DEBRIDEMENT OF WRIST;  Surgeon: Iran Planas,  MD;  Location: [place];  Service: Orthopedics;  Laterality: Left;   INSERTION OF DIALYSIS CATHETER Right 07/04/2017   Procedure: INSERTION OF TUNNELED  DIALYSIS CATHETER - RIGHT INTERNAL JUGULAR PLACEMENT;  Surgeon: Conrad Weston, MD;  Location: [place];  Service: Vascular;  Laterality: Right;   IR FLUORO GUIDE CV LINE RIGHT  09/08/2021   IR THROMBECTOMY AV FISTULA W/THROMBOLYSIS/PTA INC/SHUNT/IMG LEFT Left 09/10/2021   IR US GUIDE VASC ACCESS LEFT  09/10/2021   IR US GUIDE VASC ACCESS RIGHT  09/08/2021   LUNG BIOPSY     TEE WITHOUT CARDIOVERSION N/A  05/25/2021   Procedure: TRANSESOPHAGEAL ECHOCARDIOGRAM (TEE);  Surgeon: Geralynn Rile, MD;  Location: [place];  Service: Cardiovascular;  Laterality: N/A;   TUBAL LIGATION       The history is provided by the patient and the spouse. No language interpreter was used.  Dizziness Associated symptoms: nausea and vomiting   Associated symptoms: no chest pain, no headaches, no palpitations and no shortness of breath        Home Medications Prior to Admission medications   Medication Sig Start Date End Date Taking? Authorizing Provider  ondansetron (ZOFRAN) 4 MG tablet Take 1 tablet (4 mg total) by mouth every 8 (eight) hours as needed for nausea or vomiting. 12/20/21  Yes Wynona Dove A, DO  sucralfate (CARAFATE) 1 g tablet Take 1 tablet (1 g total) by mouth 4 (four) times daily -  with meals and at bedtime for 7 days. 12/20/21 12/27/21 Yes Jeanell Sparrow, DO  acetaminophen (TYLENOL) 325 MG tablet Take 650 mg by mouth every 6 (six) hours as needed for pain.    [provider]  amLODipine (NORVASC) 10 MG tablet Take 1 tablet (10 mg total) by mouth daily. 06/04/21   Geradine Girt, DO  carvedilol (COREG) 3.125 MG tablet Take 1 tablet (3.125 mg total) by mouth 2 (two) times daily with a meal. 06/04/21   Geradine Girt, DO  hydrALAZINE (APRESOLINE) 50 MG tablet Take 1 tablet (50 mg total) by mouth every 12 (twelve) hours. 09/11/21 10/11/21  Arrien, Jimmy Picket, MD  hydroxychloroquine (PLAQUENIL) 200 MG tablet Take 200 mg by mouth daily. 09/06/21   [provider]  multivitamin (RENA-VIT) TABS tablet Take 1 tablet by mouth daily. 08/21/21   [provider]  sevelamer carbonate (RENVELA) 800 MG tablet Take 800-2,400 mg by mouth See admin instructions. 2400 mg with meals 800 mg with snacks 08/28/21   [provider]      Allergies    Patient has no known allergies.    Review of Systems   Review of Systems  Constitutional:  Negative for activity change and  fever.  HENT:  Negative for facial swelling and trouble swallowing.   Eyes:  Negative for discharge and redness.  Respiratory:  Negative for cough and shortness of breath.   Cardiovascular:  Negative for chest pain and palpitations.  Gastrointestinal:  Positive for abdominal pain, nausea and vomiting.  Genitourinary:  Negative for dysuria and flank pain.  Musculoskeletal:  Negative for back pain and gait problem.  Skin:  Negative for pallor and rash.  Neurological:  Positive for dizziness. Negative for syncope and headaches.  Psychiatric/Behavioral:  Positive for agitation and behavioral problems.     Physical Exam Updated Vital Signs BP 114/85   Pulse 92   Temp 97.9 F (36.6 C) (Oral)   Resp 16   Ht '5\' 4"'$  (1.626 m)   Wt 50.4 kg  SpO2 96%   BMI 19.07 kg/m  Physical Exam Vitals and nursing note reviewed.  Constitutional:      General: She is not in acute distress.    Appearance: Normal appearance. She is not ill-appearing, toxic-appearing or diaphoretic.     Comments: Frail  HENT:     Head: Normocephalic and atraumatic. No right periorbital erythema or left periorbital erythema.     Comments: No external evidence of head trauma    Right Ear: External ear normal.     Left Ear: External ear normal.     Nose: Nose normal.     Mouth/Throat:     Mouth: Mucous membranes are moist.  Eyes:     General: No scleral icterus.       Right eye: No discharge.        Left eye: No discharge.     Extraocular Movements: Extraocular movements intact.     Pupils: Pupils are equal, round, and reactive to light.     Comments: Arcus senillis  Cardiovascular:     Rate and Rhythm: Normal rate and regular rhythm.     Pulses: Normal pulses.     Heart sounds: Normal heart sounds.  Pulmonary:     Effort: Pulmonary effort is normal. No respiratory distress.     Breath sounds: Normal breath sounds.  Abdominal:     General: Abdomen is flat.     Palpations: Abdomen is soft.     Tenderness:  There is abdominal tenderness in the right lower quadrant and periumbilical area. There is no guarding or rebound.    Musculoskeletal:        General: Normal range of motion.       Arms:     Cervical back: Full passive range of motion without pain and normal range of motion. No rigidity.     Right lower leg: No edema.     Left lower leg: No edema.     Comments: Diffuse back pain lumbar paraspinal, with distraction she does not report pain on palpation.  No midline spinous process pain to palpation or percussion.   Skin:    General: Skin is warm and dry.     Capillary Refill: Capillary refill takes less than 2 seconds.       Neurological:     Mental Status: She is alert and oriented to person, place, and time.     GCS: GCS eye subscore is 4. GCS verbal subscore is 5. GCS motor subscore is 6.     Cranial Nerves: Cranial nerves 2-12 are intact. No dysarthria or facial asymmetry.     Sensory: Sensation is intact.     Motor: Motor function is intact. No tremor.     Coordination: Coordination is intact. Coordination normal.     Gait: Gait is intact.     Comments: Sleepy on exam but is arousable to voice Following commands appropriately   Psychiatric:        Attention and Perception: She is inattentive.        Mood and Affect: Mood normal.        Behavior: Behavior normal. Behavior is cooperative.        Thought Content: Thought content does not include homicidal or suicidal ideation. Thought content does not include homicidal or suicidal plan.     ED Results / Procedures / Treatments   Labs (all labs ordered are listed, but only abnormal results are displayed) Labs Reviewed  CBC - Abnormal; Notable for the following components:  Result Value   RDW 18.5 (*)    All other components within normal limits  COMPREHENSIVE METABOLIC PANEL - Abnormal; Notable for the following components:   Chloride 91 (*)    Glucose, Bld 111 (*)    Creatinine, Ser 6.06 (*)    Total Protein 8.8 (*)     GFR, Estimated 7 (*)    All other components within normal limits  RESP PANEL BY RT-PCR (FLU A&B, COVID) ARPGX2  URINALYSIS, ROUTINE W REFLEX MICROSCOPIC  ETHANOL  OSMOLALITY  CBG MONITORING, ED    EKG EKG Interpretation  Date/Time:  Saturday December 19 2021 22:25:11 EDT Ventricular Rate:  90 PR Interval:  156 QRS Duration: 86 QT Interval:  408 QTC Calculation: 499 R Axis:   106 Text Interpretation: Normal sinus rhythm Biatrial enlargement Rightward axis Pulmonary disease pattern Biventricular hypertrophy Prolonged QT Abnormal ECG When compared with ECG of 07-Sep-2021 20:02, PREVIOUS ECG IS PRESENT similar to prior tracing Confirmed by Wynona Dove ([id number]) on 12/20/2021 7:42:53 AM  Radiology CT L-SPINE NO CHARGE  Result Date: 12/20/2021 CLINICAL DATA:  61 year old female with dizziness after dialysis. Vomiting. Low back pain. EXAM: CT LUMBAR SPINE WITHOUT CONTRAST TECHNIQUE: Technique: Multiplanar CT images of the lumbar spine were reconstructed from contemporary CT of the Abdomen and Pelvis. RADIATION DOSE REDUCTION: This exam was performed according to the departmental dose-optimization program which includes automated exposure control, adjustment of the mA and/or kV according to patient size and/or use of iterative reconstruction technique. CONTRAST:  None COMPARISON:  CT Abdomen and Pelvis today are reported separately. Lumbar MRI 05/22/2021. FINDINGS: Segmentation: Normal, the same numbering system used on the January MRI. Alignment: Stable lumbar lordosis. No significant scoliosis or spondylolisthesis. Vertebrae: Chronic T12 and L1 endplate Schmorl's nodes are stable since January. Underlying osteopenia. Stable vertebral height. No acute osseous abnormality identified. Visible sacrum appears intact. Chronic anterior inferior SI joint degeneration. Paraspinal and other soft tissues: Abdominal and pelvic viscera are detailed separately. Lumbar paraspinal soft tissues are within normal  limits. Disc levels: Capacious spinal canal. Minor lumbar disc bulging. No lumbar spinal stenosis. Chronic facet arthropathy on the left at L3-L4, bilaterally at L4-L5 and L5-S1. Moderate facet hypertrophy and chronic subchondral cysts appears stable by CT since January. IMPRESSION: 1. No acute osseous abnormality in the lumbar spine. Chronic lower lumbar facet arthropathy, T12 and L1 endplate Schmorl's nodes. 2. Capacious spinal canal as demonstrated by MRI in January. No spinal stenosis. 3. CT Abdomen and Pelvis today reported separately. Electronically Signed   By: Genevie Ann M.D.   On: 12/20/2021 04:40   CT ABDOMEN PELVIS WO CONTRAST  Result Date: 12/20/2021 CLINICAL DATA:  61 year old female with dizziness after dialysis. Vomiting. Low back pain. EXAM: CT ABDOMEN AND PELVIS WITHOUT CONTRAST TECHNIQUE: Multidetector CT imaging of the abdomen and pelvis was performed following the standard protocol without IV contrast. RADIATION DOSE REDUCTION: This exam was performed according to the departmental dose-optimization program which includes automated exposure control, adjustment of the mA and/or kV according to patient size and/or use of iterative reconstruction technique. COMPARISON:  Lumbar spine CT today reported separately. Noncontrast CT Abdomen and Pelvis 06/11/2021. FINDINGS: Lower chest: No pericardial or pleural effusion. Chronically tortuous descending thoracic aorta with mild calcified atherosclerosis. Improved lung base ventilation compared to February with mild residual atelectasis or scarring. Hepatobiliary: Negative noncontrast liver. Chronic cholelithiasis (15 mm series 3, image 29). No pericholecystic inflammation is evident. Pancreas: Negative noncontrast appearance. Spleen: Negative. Adrenals/Urinary Tract: Stable native renal atrophy. Adrenal glands are within  normal limits. Decompressed bladder. No nephrolithiasis or pararenal inflammation. Stomach/Bowel: No dilated large or small bowel.  Suboptimal individual bowel loop detail in the absence of contrast and given paucity of peritoneal fat. No free air or free fluid in the abdomen. Appendix not delineated today. Vascular/Lymphatic: Severe Aortoiliac calcified atherosclerosis. Stable caliber abdominal aorta with no discrete aneurysm. Vascular patency is not evaluated in the absence of IV contrast. No lymphadenopathy identified. Reproductive: Negative noncontrast appearance. Other: No definite pelvic free fluid. Musculoskeletal: Lumbar spine detailed separately. No acute osseous abnormality identified. IMPRESSION: 1. No acute or inflammatory process identified in the noncontrast abdomen or pelvis. Lumbar spine CT detailed separately. 2. Chronic cholelithiasis, renal atrophy. 3. Aortic Atherosclerosis (ICD10-I70.0). Electronically Signed   By: Genevie Ann M.D.   On: 12/20/2021 04:35   CT Head Wo Contrast  Result Date: 12/20/2021 CLINICAL DATA:  61 year old female with dizziness following dialysis. Delirium. EXAM: CT HEAD WITHOUT CONTRAST TECHNIQUE: Contiguous axial images were obtained from the base of the skull through the vertex without intravenous contrast. RADIATION DOSE REDUCTION: This exam was performed according to the departmental dose-optimization program which includes automated exposure control, adjustment of the mA and/or kV according to patient size and/or use of iterative reconstruction technique. COMPARISON:  Brain MRI 10/09/2017.  Head CT 07/11/2017. FINDINGS: Brain: Stable cerebral volume. No midline shift, ventriculomegaly, mass effect, evidence of mass lesion, intracranial hemorrhage or evidence of cortically based acute infarction. Moderate patchy and confluent bilateral cerebral white matter hypodensity appears stable to the 2019 MRI appearance. Otherwise normal Kanyia Heaslip-white matter differentiation. Vascular: Calcified atherosclerosis at the skull base. No suspicious intracranial vascular hyperdensity. Skull: Scaphocephaly, normal  variant. No acute osseous abnormality identified. Sinuses/Orbits: Visualized paranasal sinuses and mastoids are stable and well aerated. Other: No acute orbit or scalp soft tissue finding. IMPRESSION: 1. No acute intracranial abnormality. 2. Moderate chronic cerebral white matter changes appear stable since a 2019 MRI. Electronically Signed   By: Genevie Ann M.D.   On: 12/20/2021 04:13   DG Chest Portable 1 View  Result Date: 12/20/2021 CLINICAL DATA:  Dizziness with vomiting and constipation. EXAM: PORTABLE CHEST 1 VIEW COMPARISON:  Sep 09, 2021 FINDINGS: Multiple overlying radiopaque cardiac lead wires are noted. The right-sided venous catheter seen on the prior study has been removed. The heart size and mediastinal contours are within normal limits. Very mild atelectasis is seen within the mid right lung. There is no evidence of acute infiltrate, pleural effusion or pneumothorax. The visualized skeletal structures are unremarkable. IMPRESSION: No acute cardiopulmonary disease. Electronically Signed   By: Virgina Norfolk M.D.   On: 12/20/2021 03:35    Procedures Procedures    Medications Ordered in ED Medications - No data to display  ED Course/ Medical Decision Making/ A&P                           Medical Decision Making Amount and/or Complexity of Data Reviewed Labs: ordered. Radiology: ordered.  Risk Prescription drug management.   This patient presents to the ED with chief complaint(s) of abd pain/n/v/behavior changes with pertinent past medical history of ESRD on HD, CHF, Lupus   which further complicates the presenting complaint. The complaint involves an extensive differential diagnosis and also carries with it a high risk of complications and morbidity.    The differential diagnosis includes but not limited to  Differential diagnosis includes but is not exclusive to ectopic pregnancy, ovarian cyst, ovarian torsion, acute appendicitis, urinary tract infection,  endometriosis, bowel  obstruction, hernia, colitis, renal colic, gastroenteritis, volvulus etc.  Differential diagnoses for altered mental status includes but is not exclusive to alcohol, illicit or prescription medications, intracranial pathology such as stroke, intracerebral hemorrhage, fever or infectious causes including sepsis, hypoxemia, uremia, trauma, endocrine related disorders such as diabetes, hypoglycemia, thyroid-related diseases, etc.   . Serious etiologies were considered.   Patients LKN was >30 days ago  The initial plan is to screening labs, also obtain imaging,   Additional history obtained: Additional history obtained from spouse Records reviewed previous admission documents and prior ed visits, prior labs/imaging   Independent labs interpretation:  The following labs were independently interpreted: CMP with creatinine similar to baseline, potassium is normal.  Consistent with ESRD.  CBC stable.  No leukocytosis. Hgb 13.2.  She rarely produces any urine.  Independent visualization of imaging: - I independently visualized the following imaging with scope of interpretation limited to determining acute life threatening conditions related to emergency care: CXR, CTH, CTAP, CT Lumbar, which revealed no acute process  Cardiac monitoring was reviewed and interpreted by myself which shows NSR  Treatment and Reassessment: P.o. challenge completed Her symptoms have resolved  Consultation: - Consulted or discussed management/test interpretation w/ external professional: Not applicable  Consideration for admission or further workup: Admission was considered  Patient's nausea and vomiting has resolved.  No episodes of emesis while in the emergency department.  CT imaging of the abdomen is unremarkable.  Labs are stable.  She is tolerant p.o. intake without difficulty.  Repeat abdominal exam is soft, nontender, nonperitoneal.  I have low suspicion for acute life threatening intra-abdominal etiology  of her complaints.  Favor possible viral syndrome versus foodborne illness given resolution of symptoms.  Spouse concerned about behavior changes over the past 1.5 months.  He reports that she is intermittently agitated, not sleeping well, intermittently paranoid.  He is concerned that she is possibly doing drugs.  She denies any ingestion of illicit drugs.  She does not appear intoxicated on my exam.  She is neurologically intact on my exam.  She is ambulatory with steady gait.  CT imaging was unremarkable.  Patient is breathing confine ambient air.  I have low suspicion for acute stroke given negative CT imaging and duration of symptoms.  No focal deficits on exam.  These behavior changes may be associated with early dementia, advise patient and spouse follow-up PCP regarding further evaluation of this.  The patient improved significantly and was discharged in stable condition. Detailed discussions were had with the patient regarding current findings, and need for close f/u with PCP or on call doctor. The patient has been instructed to return immediately if the symptoms worsen in any way for re-evaluation. Patient verbalized understanding and is in agreement with current care plan. All questions answered prior to discharge.    Social Determinants of health: Counseled patient for approximately 3 minutes regarding smoking cessation. Discussed risks of smoking and how they applied and affected their visit here today. Patient not ready to quit at this time, however will follow up with their primary doctor when they are.   CPT code: [phone]: intermediate counseling for smoking cessation   Social History   Tobacco Use   Smoking status: Some Days    Packs/day: 0.25    Types: Cigarettes   Smokeless tobacco: Never  Vaping Use   Vaping Use: Never used  Substance Use Topics   Alcohol use: No   Drug use: No  Final Clinical Impression(s) / ED Diagnoses Final diagnoses:  Abdominal  pain, unspecified abdominal location  Cognitive and behavioral changes    Rx / DC Orders ED Discharge Orders          Ordered    ondansetron (ZOFRAN) 4 MG tablet  Every 8 hours PRN        12/20/21 0744    sucralfate (CARAFATE) 1 g tablet  3 times daily with meals & bedtime        12/20/21 [place], [place], DO 12/20/21 [phone]

## 2021-12-21 ENCOUNTER — Emergency Department (HOSPITAL_COMMUNITY): Payer: Medicare Other

## 2021-12-21 ENCOUNTER — Encounter (HOSPITAL_COMMUNITY): Payer: Self-pay

## 2021-12-21 ENCOUNTER — Other Ambulatory Visit: Payer: Self-pay

## 2021-12-21 ENCOUNTER — Inpatient Hospital Stay (HOSPITAL_COMMUNITY)
Admission: EM | Admit: 2021-12-21 | Discharge: 2021-12-25 | DRG: 377 | Disposition: A | Discharge status: Home / Self Care | Payer: Medicare Other | Attending: Family Medicine | Admitting: Family Medicine

## 2021-12-21 DIAGNOSIS — R64 Cachexia: Secondary | ICD-10-CM | POA: Diagnosis present

## 2021-12-21 DIAGNOSIS — Z8674 Personal history of sudden cardiac arrest: Secondary | ICD-10-CM

## 2021-12-21 DIAGNOSIS — M3214 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: Secondary | ICD-10-CM | POA: Diagnosis present

## 2021-12-21 DIAGNOSIS — G629 Polyneuropathy, unspecified: Secondary | ICD-10-CM | POA: Diagnosis present

## 2021-12-21 DIAGNOSIS — G40909 Epilepsy, unspecified, not intractable, without status epilepticus: Secondary | ICD-10-CM | POA: Diagnosis present

## 2021-12-21 DIAGNOSIS — K529 Noninfective gastroenteritis and colitis, unspecified: Secondary | ICD-10-CM

## 2021-12-21 DIAGNOSIS — R9431 Abnormal electrocardiogram [ECG] [EKG]: Secondary | ICD-10-CM | POA: Diagnosis present

## 2021-12-21 DIAGNOSIS — Z8041 Family history of malignant neoplasm of ovary: Secondary | ICD-10-CM

## 2021-12-21 DIAGNOSIS — F5089 Other specified eating disorder: Secondary | ICD-10-CM | POA: Diagnosis present

## 2021-12-21 DIAGNOSIS — Z992 Dependence on renal dialysis: Secondary | ICD-10-CM

## 2021-12-21 DIAGNOSIS — N186 End stage renal disease: Secondary | ICD-10-CM | POA: Diagnosis present

## 2021-12-21 DIAGNOSIS — E876 Hypokalemia: Secondary | ICD-10-CM

## 2021-12-21 DIAGNOSIS — R54 Age-related physical debility: Secondary | ICD-10-CM | POA: Diagnosis present

## 2021-12-21 DIAGNOSIS — I132 Hypertensive heart and chronic kidney disease with heart failure and with stage 5 chronic kidney disease, or end stage renal disease: Secondary | ICD-10-CM | POA: Diagnosis present

## 2021-12-21 DIAGNOSIS — J449 Chronic obstructive pulmonary disease, unspecified: Secondary | ICD-10-CM | POA: Diagnosis present

## 2021-12-21 DIAGNOSIS — G4733 Obstructive sleep apnea (adult) (pediatric): Secondary | ICD-10-CM | POA: Diagnosis present

## 2021-12-21 DIAGNOSIS — Z681 Body mass index (BMI) 19 or less, adult: Secondary | ICD-10-CM

## 2021-12-21 DIAGNOSIS — I10 Essential (primary) hypertension: Secondary | ICD-10-CM | POA: Diagnosis present

## 2021-12-21 DIAGNOSIS — K250 Acute gastric ulcer with hemorrhage: Secondary | ICD-10-CM

## 2021-12-21 DIAGNOSIS — K259 Gastric ulcer, unspecified as acute or chronic, without hemorrhage or perforation: Secondary | ICD-10-CM | POA: Diagnosis present

## 2021-12-21 DIAGNOSIS — R1310 Dysphagia, unspecified: Secondary | ICD-10-CM | POA: Diagnosis present

## 2021-12-21 DIAGNOSIS — R748 Abnormal levels of other serum enzymes: Secondary | ICD-10-CM | POA: Diagnosis present

## 2021-12-21 DIAGNOSIS — M549 Dorsalgia, unspecified: Secondary | ICD-10-CM | POA: Diagnosis present

## 2021-12-21 DIAGNOSIS — K621 Rectal polyp: Secondary | ICD-10-CM | POA: Diagnosis present

## 2021-12-21 DIAGNOSIS — I5042 Chronic combined systolic (congestive) and diastolic (congestive) heart failure: Secondary | ICD-10-CM | POA: Diagnosis present

## 2021-12-21 DIAGNOSIS — R262 Difficulty in walking, not elsewhere classified: Secondary | ICD-10-CM | POA: Diagnosis present

## 2021-12-21 DIAGNOSIS — K269 Duodenal ulcer, unspecified as acute or chronic, without hemorrhage or perforation: Secondary | ICD-10-CM

## 2021-12-21 DIAGNOSIS — K645 Perianal venous thrombosis: Secondary | ICD-10-CM | POA: Diagnosis present

## 2021-12-21 DIAGNOSIS — K921 Melena: Principal | ICD-10-CM

## 2021-12-21 DIAGNOSIS — E278 Other specified disorders of adrenal gland: Secondary | ICD-10-CM | POA: Diagnosis present

## 2021-12-21 DIAGNOSIS — F17210 Nicotine dependence, cigarettes, uncomplicated: Secondary | ICD-10-CM | POA: Diagnosis present

## 2021-12-21 DIAGNOSIS — I959 Hypotension, unspecified: Secondary | ICD-10-CM | POA: Diagnosis present

## 2021-12-21 DIAGNOSIS — K922 Gastrointestinal hemorrhage, unspecified: Secondary | ICD-10-CM | POA: Diagnosis not present

## 2021-12-21 DIAGNOSIS — Z5902 Unsheltered homelessness: Secondary | ICD-10-CM

## 2021-12-21 DIAGNOSIS — Z5900 Homelessness unspecified: Secondary | ICD-10-CM

## 2021-12-21 DIAGNOSIS — D631 Anemia in chronic kidney disease: Secondary | ICD-10-CM | POA: Diagnosis present

## 2021-12-21 DIAGNOSIS — N2581 Secondary hyperparathyroidism of renal origin: Secondary | ICD-10-CM | POA: Diagnosis present

## 2021-12-21 DIAGNOSIS — Z8673 Personal history of transient ischemic attack (TIA), and cerebral infarction without residual deficits: Secondary | ICD-10-CM

## 2021-12-21 DIAGNOSIS — Z79899 Other long term (current) drug therapy: Secondary | ICD-10-CM

## 2021-12-21 DIAGNOSIS — Z803 Family history of malignant neoplasm of breast: Secondary | ICD-10-CM

## 2021-12-21 DIAGNOSIS — D125 Benign neoplasm of sigmoid colon: Secondary | ICD-10-CM

## 2021-12-21 DIAGNOSIS — G8929 Other chronic pain: Secondary | ICD-10-CM | POA: Diagnosis present

## 2021-12-21 DIAGNOSIS — E279 Disorder of adrenal gland, unspecified: Secondary | ICD-10-CM

## 2021-12-21 DIAGNOSIS — Z808 Family history of malignant neoplasm of other organs or systems: Secondary | ICD-10-CM

## 2021-12-21 DIAGNOSIS — Z8616 Personal history of COVID-19: Secondary | ICD-10-CM

## 2021-12-21 LAB — LIPASE, BLOOD: Lipase: 82 U/L — ABNORMAL HIGH (ref 11–51)

## 2021-12-21 LAB — COMPREHENSIVE METABOLIC PANEL
ALT: 9 U/L (ref 0–44)
AST: 27 U/L (ref 15–41)
Albumin: 3.8 g/dL (ref 3.5–5.0)
Alkaline Phosphatase: 70 U/L (ref 38–126)
Anion gap: 21 — ABNORMAL HIGH (ref 5–15)
BUN: 44 mg/dL — ABNORMAL HIGH (ref 8–23)
CO2: 21 mmol/L — ABNORMAL LOW (ref 22–32)
Calcium: 9.6 mg/dL (ref 8.9–10.3)
Chloride: 94 mmol/L — ABNORMAL LOW (ref 98–111)
Creatinine, Ser: 11.08 mg/dL — ABNORMAL HIGH (ref 0.44–1.00)
GFR, Estimated: 4 mL/min — ABNORMAL LOW (ref >60)
Glucose, Bld: 90 mg/dL (ref 70–99)
Potassium: 3.7 mmol/L (ref 3.5–5.1)
Sodium: 136 mmol/L (ref 135–145)
Total Bilirubin: 0.3 mg/dL (ref 0.3–1.2)
Total Protein: 8.3 g/dL — ABNORMAL HIGH (ref 6.5–8.1)

## 2021-12-21 LAB — CBC WITH DIFFERENTIAL/PLATELET
Abs Immature Granulocytes: 0.03 10*3/uL (ref 0.00–0.07)
Basophils Absolute: 0.1 10*3/uL (ref 0.0–0.1)
Basophils Relative: 1 %
Eosinophils Absolute: 0.1 10*3/uL (ref 0.0–0.5)
Eosinophils Relative: 2 %
HCT: 42.5 % (ref 36.0–46.0)
Hemoglobin: 13.2 g/dL (ref 12.0–15.0)
Immature Granulocytes: 0 %
Lymphocytes Relative: 23 %
Lymphs Abs: 1.9 10*3/uL (ref 0.7–4.0)
MCH: 28.6 pg (ref 26.0–34.0)
MCHC: 31.1 g/dL (ref 30.0–36.0)
MCV: 92 fL (ref 80.0–100.0)
Monocytes Absolute: 0.9 10*3/uL (ref 0.1–1.0)
Monocytes Relative: 10 %
Neutro Abs: 5.4 10*3/uL (ref 1.7–7.7)
Neutrophils Relative %: 64 %
Platelets: 166 10*3/uL (ref 150–400)
RBC: 4.62 MIL/uL (ref 3.87–5.11)
RDW: 18.4 % — ABNORMAL HIGH (ref 11.5–15.5)
WBC: 8.3 10*3/uL (ref 4.0–10.5)
nRBC: 0 % (ref 0.0–0.2)

## 2021-12-21 LAB — POC OCCULT BLOOD, ED: Fecal Occult Bld: POSITIVE — AB

## 2021-12-21 MED ORDER — SODIUM CHLORIDE 0.9 % IV BOLUS
250.0000 mL | Freq: Once | INTRAVENOUS | Status: AC
  Administered 2021-12-21: 250 mL via INTRAVENOUS

## 2021-12-21 MED ORDER — ONDANSETRON HCL 4 MG/2ML IJ SOLN
4.0000 mg | Freq: Once | INTRAMUSCULAR | Status: AC
  Administered 2021-12-21: 4 mg via INTRAVENOUS
  Filled 2021-12-21: qty 2

## 2021-12-21 NOTE — ED Provider Notes (Signed)
[facility] EMERGENCY DEPARTMENT Provider Note   CSN: [id number] Arrival date & time: 12/21/21  1751    History  Chief Complaint  Patient presents with   Abdominal Pain    Kathleen [name] is a 61 y.o. female history of COPD, ESRD on HD Tuesday Thursday Saturday, hypertension, lupus, seizure, TIA, prior suicide attempt here for evaluation of not feeling well.  States 2 days ago she was seen for vomiting and abdominal pain.  Has not had emesis since dc however still feels unwell.  States she has had blood in her stools.  Describes this as "bright red." Today 4-5 BM with bright red blood with some clots. Unsure of last colonoscopy. Does not typically follow with GI per patient. Feels generally weak. No chronic NSAID use, anticoagulation, Etoh. No CP, SOB, fever. Compliant with dialysis. Admitted earlier this year for COVID, uremia, bloody stool, no endo wu done, was supposed to FU outpatient however did not FU. She states this resolved after prior dc and she did not think she needed further WU done.  States she does have some intermittent back pain when this occurs and goes into her legs. No recent falls or injuries. Feels generally weak. No incontinence saddle anesthesia. Sx chronic in nature. No pain currently.   Seen 12/19/21 CT wo contrast neg, no occult done, emesis resolved, tolerating PO intake.   Patient is very poor historian, rambles about varies things  Apparently per note from 2 days ago patient with gradual personality and memory changes over the last 1.5 months.  Attempted to contact emerg contact Jackelyn Hoehn at [phone] wo reaching x2.   PCP- WF  HPI     Home Medications Prior to Admission medications   Medication Sig Start Date End Date Taking? Authorizing Provider  acetaminophen (TYLENOL) 325 MG tablet Take 650 mg by mouth every 6 (six) hours as needed for pain.    [provider]  amLODipine (NORVASC) 10 MG tablet Take 1 tablet  (10 mg total) by mouth daily. 06/04/21   Geradine Girt, DO  carvedilol (COREG) 3.125 MG tablet Take 1 tablet (3.125 mg total) by mouth 2 (two) times daily with a meal. 06/04/21   Geradine Girt, DO  hydrALAZINE (APRESOLINE) 50 MG tablet Take 1 tablet (50 mg total) by mouth every 12 (twelve) hours. 09/11/21 10/11/21  Arrien, Jimmy Picket, MD  hydroxychloroquine (PLAQUENIL) 200 MG tablet Take 200 mg by mouth daily. 09/06/21   [provider]  multivitamin (RENA-VIT) TABS tablet Take 1 tablet by mouth daily. 08/21/21   [provider]  ondansetron (ZOFRAN) 4 MG tablet Take 1 tablet (4 mg total) by mouth every 8 (eight) hours as needed for nausea or vomiting. 12/20/21   Wynona Dove A, DO  sevelamer carbonate (RENVELA) 800 MG tablet Take 800-2,400 mg by mouth See admin instructions. 2400 mg with meals 800 mg with snacks 08/28/21   [provider]  sucralfate (CARAFATE) 1 g tablet Take 1 tablet (1 g total) by mouth 4 (four) times daily -  with meals and at bedtime for 7 days. 12/20/21 12/27/21  Jeanell Sparrow, DO      Allergies    Patient has no known allergies.    Review of Systems   Review of Systems  Constitutional: Negative.   HENT: Negative.    Respiratory: Negative.    Cardiovascular: Negative.   Gastrointestinal:  Positive for blood in stool. Negative for abdominal distention, abdominal pain, anal bleeding, constipation, diarrhea, nausea,  rectal pain and vomiting.  Genitourinary: Negative.   Musculoskeletal: Negative.   Skin: Negative.   Neurological: Negative.   All other systems reviewed and are negative.   Physical Exam Updated Vital Signs BP (!) 111/91   Pulse 73   Temp 97.7 F (36.5 C) (Oral)   Resp 11   Ht '5\' 4"'$  (1.626 m)   Wt 50.5 kg   SpO2 100%   BMI 19.11 kg/m  Physical Exam Vitals and nursing note reviewed. Exam conducted with a chaperone present.  Constitutional:      General: She is not in acute distress.    Appearance: She is  well-developed. She is not ill-appearing, toxic-appearing or diaphoretic.  HENT:     Head: Atraumatic.     Mouth/Throat:     Mouth: Mucous membranes are moist.  Eyes:     Pupils: Pupils are equal, round, and reactive to light.  Cardiovascular:     Rate and Rhythm: Normal rate.     Heart sounds: Normal heart sounds.  Pulmonary:     Effort: Pulmonary effort is normal. No respiratory distress.     Breath sounds: Normal breath sounds.  Abdominal:     General: Bowel sounds are normal. There is no distension.     Palpations: Abdomen is soft.     Tenderness: There is abdominal tenderness.     Hernia: No hernia is present.     Comments: Diffuse tenderness on exam however distractible  Genitourinary:    Comments: Bright red blood in stool with small clots present.  Mucus in stool.  Skin breakdown to sacrum, thrombosed hemorrhoids.  Did not tolerate rectal exam very well Musculoskeletal:        General: Normal range of motion.     Cervical back: Normal range of motion.     Comments: Mild midline lumbar tenderness, distractible, lifts bilateral legs off bed without difficulty, no shortening or rotation of legs.  Skin:    General: Skin is warm and dry.     Capillary Refill: Capillary refill takes less than 2 seconds.     Comments: No obvious rash or lesions  Neurological:     General: No focal deficit present.     Mental Status: She is alert.     Cranial Nerves: Cranial nerves 2-12 are intact.     Sensory: Sensation is intact.     Motor: Motor function is intact.     Gait: Gait is intact.     Comments: CN 2-12 grossly intact Ambulatory   Psychiatric:        Mood and Affect: Mood normal.     Comments: Tangential speech    ED Results / Procedures / Treatments   Labs (all labs ordered are listed, but only abnormal results are displayed) Labs Reviewed  CBC WITH DIFFERENTIAL/PLATELET - Abnormal; Notable for the following components:      Result Value   RDW 18.4 (*)    All other  components within normal limits  COMPREHENSIVE METABOLIC PANEL - Abnormal; Notable for the following components:   Chloride 94 (*)    CO2 21 (*)    BUN 44 (*)    Creatinine, Ser 11.08 (*)    Total Protein 8.3 (*)    GFR, Estimated 4 (*)    Anion gap 21 (*)    All other components within normal limits  LIPASE, BLOOD - Abnormal; Notable for the following components:   Lipase 82 (*)    All other components within normal limits  POC OCCULT BLOOD, ED - Abnormal; Notable for the following components:   Fecal Occult Bld POSITIVE (*)    All other components within normal limits    EKG None  Radiology CT L-SPINE NO CHARGE  Result Date: 12/20/2021 CLINICAL DATA:  61 year old female with dizziness after dialysis. Vomiting. Low back pain. EXAM: CT LUMBAR SPINE WITHOUT CONTRAST TECHNIQUE: Technique: Multiplanar CT images of the lumbar spine were reconstructed from contemporary CT of the Abdomen and Pelvis. RADIATION DOSE REDUCTION: This exam was performed according to the departmental dose-optimization program which includes automated exposure control, adjustment of the mA and/or kV according to patient size and/or use of iterative reconstruction technique. CONTRAST:  None COMPARISON:  CT Abdomen and Pelvis today are reported separately. Lumbar MRI 05/22/2021. FINDINGS: Segmentation: Normal, the same numbering system used on the January MRI. Alignment: Stable lumbar lordosis. No significant scoliosis or spondylolisthesis. Vertebrae: Chronic T12 and L1 endplate Schmorl's nodes are stable since January. Underlying osteopenia. Stable vertebral height. No acute osseous abnormality identified. Visible sacrum appears intact. Chronic anterior inferior SI joint degeneration. Paraspinal and other soft tissues: Abdominal and pelvic viscera are detailed separately. Lumbar paraspinal soft tissues are within normal limits. Disc levels: Capacious spinal canal. Minor lumbar disc bulging. No lumbar spinal stenosis.  Chronic facet arthropathy on the left at L3-L4, bilaterally at L4-L5 and L5-S1. Moderate facet hypertrophy and chronic subchondral cysts appears stable by CT since January. IMPRESSION: 1. No acute osseous abnormality in the lumbar spine. Chronic lower lumbar facet arthropathy, T12 and L1 endplate Schmorl's nodes. 2. Capacious spinal canal as demonstrated by MRI in January. No spinal stenosis. 3. CT Abdomen and Pelvis today reported separately. Electronically Signed   By: Genevie Ann M.D.   On: 12/20/2021 04:40   CT ABDOMEN PELVIS WO CONTRAST  Result Date: 12/20/2021 CLINICAL DATA:  61 year old female with dizziness after dialysis. Vomiting. Low back pain. EXAM: CT ABDOMEN AND PELVIS WITHOUT CONTRAST TECHNIQUE: Multidetector CT imaging of the abdomen and pelvis was performed following the standard protocol without IV contrast. RADIATION DOSE REDUCTION: This exam was performed according to the departmental dose-optimization program which includes automated exposure control, adjustment of the mA and/or kV according to patient size and/or use of iterative reconstruction technique. COMPARISON:  Lumbar spine CT today reported separately. Noncontrast CT Abdomen and Pelvis 06/11/2021. FINDINGS: Lower chest: No pericardial or pleural effusion. Chronically tortuous descending thoracic aorta with mild calcified atherosclerosis. Improved lung base ventilation compared to February with mild residual atelectasis or scarring. Hepatobiliary: Negative noncontrast liver. Chronic cholelithiasis (15 mm series 3, image 29). No pericholecystic inflammation is evident. Pancreas: Negative noncontrast appearance. Spleen: Negative. Adrenals/Urinary Tract: Stable native renal atrophy. Adrenal glands are within normal limits. Decompressed bladder. No nephrolithiasis or pararenal inflammation. Stomach/Bowel: No dilated large or small bowel. Suboptimal individual bowel loop detail in the absence of contrast and given paucity of peritoneal fat.  No free air or free fluid in the abdomen. Appendix not delineated today. Vascular/Lymphatic: Severe Aortoiliac calcified atherosclerosis. Stable caliber abdominal aorta with no discrete aneurysm. Vascular patency is not evaluated in the absence of IV contrast. No lymphadenopathy identified. Reproductive: Negative noncontrast appearance. Other: No definite pelvic free fluid. Musculoskeletal: Lumbar spine detailed separately. No acute osseous abnormality identified. IMPRESSION: 1. No acute or inflammatory process identified in the noncontrast abdomen or pelvis. Lumbar spine CT detailed separately. 2. Chronic cholelithiasis, renal atrophy. 3. Aortic Atherosclerosis (ICD10-I70.0). Electronically Signed   By: Genevie Ann M.D.   On: 12/20/2021 04:35   CT Head Wo Contrast  Result Date: 12/20/2021 CLINICAL DATA:  61 year old female with dizziness following dialysis. Delirium. EXAM: CT HEAD WITHOUT CONTRAST TECHNIQUE: Contiguous axial images were obtained from the base of the skull through the vertex without intravenous contrast. RADIATION DOSE REDUCTION: This exam was performed according to the departmental dose-optimization program which includes automated exposure control, adjustment of the mA and/or kV according to patient size and/or use of iterative reconstruction technique. COMPARISON:  Brain MRI 10/09/2017.  Head CT 07/11/2017. FINDINGS: Brain: Stable cerebral volume. No midline shift, ventriculomegaly, mass effect, evidence of mass lesion, intracranial hemorrhage or evidence of cortically based acute infarction. Moderate patchy and confluent bilateral cerebral white matter hypodensity appears stable to the 2019 MRI appearance. Otherwise normal gray-white matter differentiation. Vascular: Calcified atherosclerosis at the skull base. No suspicious intracranial vascular hyperdensity. Skull: Scaphocephaly, normal variant. No acute osseous abnormality identified. Sinuses/Orbits: Visualized paranasal sinuses and mastoids  are stable and well aerated. Other: No acute orbit or scalp soft tissue finding. IMPRESSION: 1. No acute intracranial abnormality. 2. Moderate chronic cerebral white matter changes appear stable since a 2019 MRI. Electronically Signed   By: Genevie Ann M.D.   On: 12/20/2021 04:13   DG Chest Portable 1 View  Result Date: 12/20/2021 CLINICAL DATA:  Dizziness with vomiting and constipation. EXAM: PORTABLE CHEST 1 VIEW COMPARISON:  Sep 09, 2021 FINDINGS: Multiple overlying radiopaque cardiac lead wires are noted. The right-sided venous catheter seen on the prior study has been removed. The heart size and mediastinal contours are within normal limits. Very mild atelectasis is seen within the mid right lung. There is no evidence of acute infiltrate, pleural effusion or pneumothorax. The visualized skeletal structures are unremarkable. IMPRESSION: No acute cardiopulmonary disease. Electronically Signed   By: Virgina Norfolk M.D.   On: 12/20/2021 03:35    Procedures Procedures    Medications Ordered in ED Medications  sodium chloride 0.9 % bolus 250 mL (0 mLs Intravenous Stopped 12/21/21 2122)  ondansetron (ZOFRAN) injection 4 mg (4 mg Intravenous Given 12/21/21 2126)    ED Course/ Medical Decision Making/ A&P    61 year old ESRD, hypertension, COPD, anemia, CHF here for evaluation of abdominal pain and bloody stool.  Seen here 2 days ago, CT scan without contrast at that time did not show any significant findings.  Patient states she has had greater than 5 episodes prior to arrival of bloody stool.  Gets some preabdominal cramping prior to that which resolves with bowel movements.  She is not anticoagulated.  No chronic NSAID use, EtOH use.  Had something similar in January thought likely due to COVID infection and self resolved so she did not follow-up for colonoscopy outpatient.  Has some generalized weakness.  Of note patient is extremely poor historian, rambles about various topics when talking with her,  reviewed prior ED note 2 days ago and family was available for consult and this apparently has been chronic over the last month and a half.  Attempted to reach out to family today for collateral however unable to reach husband.  Labs and imaging personally viewed and interpreted:  CBC without leukocytosis Metabolic panel BUN 44, creatinine 11.8 Occult positive Lipase 82, chronically elevated  Patient has had 2 bloody bowel movements here in the emergency department, low-volume however does have gross bright red blood with some small clots.  BP improving at baseline.  We will get CT angio study to make sure no active bleed requiring IR however either which way given her soft blood pressures, multiple bloody bowel movements will  need to be admitted for further management and work-up.   Care transferred to [facility], PA-C who will follow-up on CT scan and admit for GI bleed                          Medical Decision Making Amount and/or Complexity of Data Reviewed External Data Reviewed: labs, radiology, ECG and notes. Labs: ordered. Decision-making details documented in ED Course. Radiology: ordered and independent interpretation performed. Decision-making details documented in ED Course. ECG/medicine tests: ordered and independent interpretation performed. Decision-making details documented in ED Course.  Risk OTC drugs. Prescription drug management. Parenteral controlled substances. Decision regarding hospitalization. Diagnosis or treatment significantly limited by social determinants of health.           Final Clinical Impression(s) / ED Diagnoses Final diagnoses:  ESRD (end stage renal disease) ([place])  Blood in stool  Elevated lipase    Rx / DC Orders ED Discharge Orders     None         Nikelle Malatesta A, PA-C 12/21/21 2354    Regan Lemming, MD 12/22/21 0206

## 2021-12-21 NOTE — ED Notes (Signed)
Started on 3L [place]

## 2021-12-21 NOTE — ED Notes (Signed)
Patient is back in bed

## 2021-12-21 NOTE — ED Notes (Signed)
Up to the bathroom for stool sample

## 2021-12-21 NOTE — ED Notes (Signed)
Patient is up to the bathroom for 2nd bowel movement.

## 2021-12-21 NOTE — ED Triage Notes (Signed)
Patient arrives via EMS from parking lot with complaints of abdominal pain and blood in stool per patient. Patient was seen yesterday for same. Patient dialysis patient for Tues/Thurs/Sat.

## 2021-12-22 DIAGNOSIS — E279 Disorder of adrenal gland, unspecified: Secondary | ICD-10-CM

## 2021-12-22 DIAGNOSIS — K922 Gastrointestinal hemorrhage, unspecified: Secondary | ICD-10-CM | POA: Diagnosis not present

## 2021-12-22 DIAGNOSIS — R935 Abnormal findings on diagnostic imaging of other abdominal regions, including retroperitoneum: Secondary | ICD-10-CM | POA: Diagnosis not present

## 2021-12-22 DIAGNOSIS — E278 Other specified disorders of adrenal gland: Secondary | ICD-10-CM

## 2021-12-22 LAB — RENAL FUNCTION PANEL
Albumin: 3.3 g/dL — ABNORMAL LOW (ref 3.5–5.0)
Anion gap: 20 — ABNORMAL HIGH (ref 5–15)
BUN: 55 mg/dL — ABNORMAL HIGH (ref 8–23)
CO2: 23 mmol/L (ref 22–32)
Calcium: 8.9 mg/dL (ref 8.9–10.3)
Chloride: 89 mmol/L — ABNORMAL LOW (ref 98–111)
Creatinine, Ser: 12.81 mg/dL — ABNORMAL HIGH (ref 0.44–1.00)
GFR, Estimated: 3 mL/min — ABNORMAL LOW (ref >60)
Glucose, Bld: 139 mg/dL — ABNORMAL HIGH (ref 70–99)
Phosphorus: 8.2 mg/dL — ABNORMAL HIGH (ref 2.5–4.6)
Potassium: 3.2 mmol/L — ABNORMAL LOW (ref 3.5–5.1)
Sodium: 132 mmol/L — ABNORMAL LOW (ref 135–145)

## 2021-12-22 LAB — CBC
HCT: 37.1 % (ref 36.0–46.0)
Hemoglobin: 11.9 g/dL — ABNORMAL LOW (ref 12.0–15.0)
MCH: 28.8 pg (ref 26.0–34.0)
MCHC: 32.1 g/dL (ref 30.0–36.0)
MCV: 89.8 fL (ref 80.0–100.0)
Platelets: 161 10*3/uL (ref 150–400)
RBC: 4.13 MIL/uL (ref 3.87–5.11)
RDW: 18.1 % — ABNORMAL HIGH (ref 11.5–15.5)
WBC: 7.4 10*3/uL (ref 4.0–10.5)
nRBC: 0 % (ref 0.0–0.2)

## 2021-12-22 LAB — HEPATITIS B CORE ANTIBODY, TOTAL: Hep B Core Total Ab: NONREACTIVE

## 2021-12-22 LAB — MAGNESIUM: Magnesium: 2.4 mg/dL (ref 1.7–2.4)

## 2021-12-22 LAB — TYPE AND SCREEN
ABO/RH(D): B POS
Antibody Screen: NEGATIVE

## 2021-12-22 LAB — HEPATITIS B SURFACE ANTIGEN: Hepatitis B Surface Ag: NONREACTIVE

## 2021-12-22 LAB — HEPATITIS C ANTIBODY: HCV Ab: NONREACTIVE

## 2021-12-22 LAB — HEPATITIS B SURFACE ANTIBODY,QUALITATIVE: Hep B S Ab: NONREACTIVE

## 2021-12-22 MED ORDER — CHLORHEXIDINE GLUCONATE CLOTH 2 % EX PADS
6.0000 | MEDICATED_PAD | Freq: Every day | CUTANEOUS | Status: DC
Start: 2021-12-22 — End: 2021-12-25
  Administered 2021-12-23 – 2021-12-24 (×2): 6 via TOPICAL

## 2021-12-22 MED ORDER — METOCLOPRAMIDE HCL 5 MG/ML IJ SOLN
5.0000 mg | Freq: Four times a day (QID) | INTRAMUSCULAR | Status: AC
  Administered 2021-12-22 (×2): 5 mg via INTRAVENOUS
  Filled 2021-12-22 (×2): qty 1

## 2021-12-22 MED ORDER — SEVELAMER CARBONATE 800 MG PO TABS: 800.0000 mg | ORAL_TABLET | ORAL | Status: DC | PRN

## 2021-12-22 MED ORDER — HYDROXYCHLOROQUINE SULFATE 200 MG PO TABS
200.0000 mg | ORAL_TABLET | Freq: Every day | ORAL | Status: DC
  Administered 2021-12-22 – 2021-12-25 (×3): 200 mg via ORAL
  Filled 2021-12-22 (×4): qty 1

## 2021-12-22 MED ORDER — LIDOCAINE-PRILOCAINE 2.5-2.5 % EX CREA
1.0000 | TOPICAL_CREAM | CUTANEOUS | Status: DC | PRN
  Filled 2021-12-22: qty 5

## 2021-12-22 MED ORDER — NICOTINE 14 MG/24HR TD PT24
14.0000 mg | MEDICATED_PATCH | Freq: Every day | TRANSDERMAL | Status: DC
Start: 2021-12-22 — End: 2021-12-25
  Administered 2021-12-22 – 2021-12-25 (×4): 14 mg via TRANSDERMAL
  Filled 2021-12-22 (×4): qty 1

## 2021-12-22 MED ORDER — LIDOCAINE HCL (PF) 1 % IJ SOLN: 5.0000 mL | INTRAMUSCULAR | Status: DC | PRN

## 2021-12-22 MED ORDER — IOHEXOL 350 MG/ML SOLN
100.0000 mL | Freq: Once | INTRAVENOUS | Status: AC | PRN
  Administered 2021-12-22: 100 mL via INTRAVENOUS

## 2021-12-22 MED ORDER — ACETAMINOPHEN 650 MG RE SUPP
650.0000 mg | Freq: Four times a day (QID) | RECTAL | Status: DC | PRN
Start: 2021-12-22 — End: 2021-12-23

## 2021-12-22 MED ORDER — PENTAFLUOROPROP-TETRAFLUOROETH EX AERO
1.0000 | INHALATION_SPRAY | CUTANEOUS | Status: DC | PRN
  Filled 2021-12-22: qty 30

## 2021-12-22 MED ORDER — RENA-VITE PO TABS
1.0000 | ORAL_TABLET | Freq: Every day | ORAL | Status: DC
  Administered 2021-12-22 – 2021-12-24 (×3): 1 via ORAL
  Filled 2021-12-22 (×4): qty 1

## 2021-12-22 MED ORDER — SEVELAMER CARBONATE 800 MG PO TABS
2400.0000 mg | ORAL_TABLET | Freq: Three times a day (TID) | ORAL | Status: DC
  Administered 2021-12-22 – 2021-12-25 (×8): 2400 mg via ORAL
  Filled 2021-12-22 (×8): qty 3

## 2021-12-22 MED ORDER — PEG-KCL-NACL-NASULF-NA ASC-C 100 G PO SOLR: 1.0000 | Freq: Once | ORAL | Status: DC

## 2021-12-22 MED ORDER — PEG-KCL-NACL-NASULF-NA ASC-C 100 G PO SOLR
0.5000 | Freq: Once | ORAL | Status: AC
  Administered 2021-12-22: 100 g via ORAL
  Filled 2021-12-22: qty 1

## 2021-12-22 MED ORDER — PEG-KCL-NACL-NASULF-NA ASC-C 100 G PO SOLR
0.5000 | Freq: Once | ORAL | Status: AC
  Administered 2021-12-22: 100 g via ORAL
  Filled 2021-12-22 (×2): qty 1

## 2021-12-22 MED ORDER — CINACALCET HCL 30 MG PO TABS
60.0000 mg | ORAL_TABLET | ORAL | Status: DC
  Administered 2021-12-22 – 2021-12-24 (×2): 60 mg via ORAL
  Filled 2021-12-22 (×2): qty 2

## 2021-12-22 MED ORDER — BISACODYL 5 MG PO TBEC
10.0000 mg | DELAYED_RELEASE_TABLET | Freq: Four times a day (QID) | ORAL | Status: AC
  Administered 2021-12-22 (×2): 10 mg via ORAL
  Filled 2021-12-22 (×2): qty 2

## 2021-12-22 MED ORDER — ACETAMINOPHEN 325 MG PO TABS: 650.0000 mg | ORAL_TABLET | Freq: Four times a day (QID) | ORAL | Status: DC | PRN

## 2021-12-22 NOTE — Assessment & Plan Note (Addendum)
Dialysis TThSa - Nephrology consulted, recs appreciated - Continue home phosphate binder Renvela 800 mg once per day

## 2021-12-22 NOTE — Progress Notes (Addendum)
Received patient in bed to unit. Alert and oriented. Informed consent signed and in chart.   Treatment initiated: 1421  Treatment completed: 1700  Treatment not tolerated as BP during treatment dropped as fluid was being removed. UF goal not met during this visit. Total 511m of NS given. Complaints of chest pain subsided. She explained chest pain was present prior to starting treatment today. Patient had two small loose bms while on treatment. Transported back to the room alert, without acute distress. Report given to patient's floor nurse.   Access used: LUA Graft  Access issues: none   Total UF removed: 0 Medication(s) given: none Post HD VS: 104/55 92 98% 97.1 17  Post HD weight: 45.4 kg    [facility]

## 2021-12-22 NOTE — ED Notes (Signed)
Dialysis called for report, pt awaiting transport to dialysis.

## 2021-12-22 NOTE — H&P (View-Only) (Signed)
[facility] Consult: 8:10 AM 12/22/2021  LOS: 0 days    Referring Provider: Dr  Primary Care Physician:  Angela Burke, NP Primary Gastroenterologist:  unassigned  Friend and caregiver is Larry Sierras.  [phone]   Reason for Consultation:  hematochezia   HPI: Kathleen [name] is a 61 y.o. female.  PMH Lupus w nephritis, cerebritis, peripheral neuropthy.  .  ESRD, HD TTS.  Hypertension.  COPD. Admission early May 2023 with hypoxic, hypercapnic respiratory failure.  Seizures.  Admission 05/2021 with COVID-19, strep pneumo bacteremia, uremic encephalopathy, left wrist septic arthritis, after missing dialysis anemia of CKD, PRBCs in 06/2016 (1) and 06/2021 (1).  OSA.  TIA.  Cerebral vasculitis.  Cholelithiasis.  Fat-containing umbilical hernia.  Renal nephrocalcinosis.  Aortic atherosclerosis. R adnexal cyst.  CHF.  05/2018 three 2D echo with LVEF 45 to 61%, grade 1 diastolic dysfunction.  Normal pulmonary pressures.  At most mild valvular disease and regurgitation 07/2016 EGD.  Dr Oletta Lamas.  For dysphagia, abnormal CT (thickened gastric wall).  Study normal into examined duodenum. 05/2017 colonoscopy.  Dr. Steward Drone at [facility].  Unable to uncover procedure report or path.  Pt recalls nothing of polyps, diverticulosis, colitis.    Had diarrhea with intermittent hematochezia during her admission in late January.  This resolved. Meds at home: no NSAIDs, ASA, plavix, coumsdin etc. Not using previously RXd Carafate or PPI   ~ 3 d of hematochezia, diffuse abd pain eminating from [employer], n/v w occ pink tinged and dark emesis.  Before this 1 m of anorexia, 10 # wt loss (though wt loss for years).  No prior nausea, vomiting, abdominal pain.  Typically has 2 or 3 formed stools daily.  Her significant other/partner describes  episodes where she goes to the bathroom, has a bowel movement and comes out with weakness, difficulty walking, sweats followed by chills.  Patient consumes close to 2 boxes of [place] starch over a week's time.  This started eating started when she began dialysis.  She is gotten progressively weaker with shortness of breath, difficulty walking and balancing.  Some of the gait problem she attributes to chronic pain in her legs from the feet up into her groin.  She does not drink alcohol but she smokes nearly 2 packs of cigarettes daily.  Partner describes change in her behavior towards more aggressive, paranoid, confrontational.  CTAP wo contrast: No acute, no inflammatory process.  Chronic cholelithiasis.  Renal atrophy.  Aortic atherosclerosis.   CT head.  Chronic small vessel ischemia but nothing acute.   CXR: No acute dz. mild right-sided atelectasis. Hgb 11.9.  WBCs, MCV, platelets normal.  Lipase 82, amylase 465.  T. bili, alk phos, AST/ALT normal. BNP 3248.  Trop I 35.    FM Hx: breast Ca, ovarian ca, throat ca.   Patient left school at 10th grade and never read very well though she is not completely illiterate.  Past Medical History:  Diagnosis Date   Anemia    Arthritis    Cardiac arrest ([place])    Cellulitis  Cerebral vasculitis    COPD (chronic obstructive pulmonary disease) (HCC)    Elevated antinuclear antibody (ANA) level    Endometriosis    ESRD (end stage renal disease) on dialysis ([place])    Gallstones    Hypertension    Lupus (HCC)    Migraine    PONV (postoperative nausea and vomiting)    Seizures ([place])    Sepsis ([place])    Sleep apnea    Suicide attempt ([place])    TB (tuberculosis)    Transient ischemic attack (TIA)    Vasculitis of skin     Past Surgical History:  Procedure Laterality Date   AV FISTULA PLACEMENT Left 07/04/2017   Procedure: ARTERIOVENOUS (AV) FISTULA CREATION;  Surgeon: Conrad Mather, MD;  Location: [place];  Service: Vascular;  Laterality: Left;   AV  FISTULA PLACEMENT Left 11/27/2019   Procedure: INSERTION OF ARTERIOVENOUS (AV) GORE-TEX GRAFT LEFT UPPER ARM;  Surgeon: Angelia Mould, MD;  Location: [place];  Service: Vascular;  Laterality: Left;   AV FISTULA PLACEMENT Left 07/29/2020   Procedure: INSERTION OF LEFT UPPER ARM ARTERIOVENOUS (AV) GORE-TEX GRAFT;  Surgeon: Angelia Mould, MD;  Location: [place];  Service: Vascular;  Laterality: Left;   BUBBLE STUDY  05/25/2021   Procedure: BUBBLE STUDY;  Surgeon: Geralynn Rile, MD;  Location: [place];  Service: Cardiovascular;;   CESAREAN SECTION     ESOPHAGOGASTRODUODENOSCOPY N/A 07/15/2016   Procedure: ESOPHAGOGASTRODUODENOSCOPY (EGD);  Surgeon: Laurence Spates, MD;  [facility] ENDOSCOPY;  Service: Endoscopy;  Laterality: N/A;   I & D EXTREMITY Left 05/27/2021   Procedure: IRRIGATION AND DEBRIDEMENT OF WRIST;  Surgeon: Iran Planas, MD;  Location: [place];  Service: Orthopedics;  Laterality: Left;   INSERTION OF DIALYSIS CATHETER Right 07/04/2017   Procedure: INSERTION OF TUNNELED  DIALYSIS CATHETER - RIGHT INTERNAL JUGULAR PLACEMENT;  Surgeon: Conrad [place], MD;  Location: [place];  Service: Vascular;  Laterality: Right;   IR FLUORO GUIDE CV LINE RIGHT  09/08/2021   IR THROMBECTOMY AV FISTULA W/THROMBOLYSIS/PTA INC/SHUNT/IMG LEFT Left 09/10/2021   IR US GUIDE VASC ACCESS LEFT  09/10/2021   IR US GUIDE VASC ACCESS RIGHT  09/08/2021   LUNG BIOPSY     TEE WITHOUT CARDIOVERSION N/A 05/25/2021   Procedure: TRANSESOPHAGEAL ECHOCARDIOGRAM (TEE);  Surgeon: Geralynn Rile, MD;  Location: [place];  Service: Cardiovascular;  Laterality: N/A;   TUBAL LIGATION      Prior to Admission medications   Medication Sig Start Date End Date Taking? Authorizing Provider  acetaminophen (TYLENOL) 325 MG tablet Take 650 mg by mouth every 6 (six) hours as needed for pain.   Yes [provider]  amLODipine (NORVASC) 10 MG tablet Take 1 tablet (10 mg total) by mouth daily. 06/04/21  Yes  Vann, Jessica U, DO  carvedilol (COREG) 3.125 MG tablet Take 1 tablet (3.125 mg total) by mouth 2 (two) times daily with a meal. Patient taking differently: Take 6.25 mg by mouth 2 (two) times daily with a meal. 06/04/21  Yes Vann, Jessica U, DO  hydrALAZINE (APRESOLINE) 50 MG tablet Take 1 tablet (50 mg total) by mouth every 12 (twelve) hours. 09/11/21 12/23/22 Yes Arrien, Jimmy Picket, MD  hydroxychloroquine (PLAQUENIL) 200 MG tablet Take 200 mg by mouth daily. 09/06/21  Yes [provider]  multivitamin (RENA-VIT) TABS tablet Take 1 tablet by mouth daily. 08/21/21  Yes [provider]  sevelamer carbonate (RENVELA) 800 MG tablet Take 800-2,400 mg by mouth See admin instructions.  2400 mg with meals 800 mg with snacks 08/28/21  Yes [provider]  ondansetron (ZOFRAN) 4 MG tablet Take 1 tablet (4 mg total) by mouth every 8 (eight) hours as needed for nausea or vomiting. Patient not taking: Reported on 12/22/2021 12/20/21   Wynona Dove A, DO  sucralfate (CARAFATE) 1 g tablet Take 1 tablet (1 g total) by mouth 4 (four) times daily -  with meals and at bedtime for 7 days. Patient not taking: Reported on 12/22/2021 12/20/21 12/27/21  Wynona Dove A, DO    Scheduled Meds:  hydroxychloroquine  200 mg Oral Daily   multivitamin  1 tablet Oral QHS   nicotine  14 mg Transdermal Daily   sevelamer carbonate  2,400 mg Oral TID WC   Infusions:  PRN Meds: acetaminophen **OR** acetaminophen, sevelamer carbonate   Allergies as of 12/21/2021   (No Known Allergies)    Family History  Problem Relation Age of Onset   Ovarian cancer Mother    Breast cancer Sister    Throat cancer Brother     Social History   Socioeconomic History   Marital status: Significant Other    Spouse name: Not on file   Number of children: Not on file   Years of education: Not on file   Highest education level: Not on file  Occupational History   Not on file  Tobacco Use   Smoking status: Some  Days    Packs/day: 0.25    Types: Cigarettes   Smokeless tobacco: Never  Vaping Use   Vaping Use: Never used  Substance and Sexual Activity   Alcohol use: No   Drug use: No   Sexual activity: Not Currently    Birth control/protection: Post-menopausal  Other Topics Concern   Not on file  Social History Narrative   Not on file   Social Determinants of Health   Financial Resource Strain: Not on file  Food Insecurity: Not on file  Transportation Needs: Not on file  Physical Activity: Not on file  Stress: Not on file  Social Connections: Not on file  Intimate Partner Violence: Not on file    REVIEW OF SYSTEMS: Constitutional: Weakness. ENT:  No nose bleeds Pulm: Dyspnea on exertion CV:  No palpitations, no LE edema.  No angina GU: An uric GI: See HPI Heme: Denies unusual or excessive bleeding or bruising.  No dysphagia. Transfusions:  as per HPI. Neuro:  No headaches, no peripheral tingling or numbness.  Mild dizziness.  No recent seizure.  Derm:  No itching, no rash or sores.  Endocrine:  No sweats or chills.  No polyuria or dysuria Immunization:  reviewed Travel:  Not queried.     PHYSICAL EXAM: Vital signs in last 24 hours: Vitals:   12/22/21 0600 12/22/21 0734  BP: (!) 96/55   Pulse: 72   Resp: 15   Temp:  98.6 F (37 C)  SpO2: 100%    Wt Readings from Last 3 Encounters:  12/21/21 50.5 kg  12/19/21 50.4 kg  09/11/21 50.3 kg    General: Patient is thin looks severely chronically ill, cachectic.  She is alert and appears comfortable. Head: No facial asymmetry or swelling.  No signs of head trauma.  Prominent bony architecture in her face consistent with cachexia. Eyes: Conjunctiva pink.  Sclera are muddy. Ears: No obvious hearing loss. Nose: No congestion or discharge Mouth: Tongue is coated with a beige-white material,?  Candida.  Mucosa is pink, moist, clear.  Upper dentures in place  not removed.  Remaining teeth in poor condition.  Tongue  midline. Neck: No JVD, no masses, no thyromegaly Lungs: Diminished breath sounds but overall clear.  No rhonchi or upper airway noises.  Unlabored breathing at rest.  No cough during encounter. Heart: RRR.  No MRG.  S1, S2 present Abdomen: Thin, soft.  Diffusely tender without guarding or rebound.  No masses, HSM, bruits, obvious hernias..   Rectal: No visible or palpable masses.  Exam glove devoid of stool or blood. Musc/Skeltl: No joint redness or swelling. Extremities: No CCE. Neurologic: Oriented x3.  Rambling historian.  Moves all 4 limbs without tremor, strength not tested. Skin: No rash, no sores, no suspicious lesions. Nodes: No cervical adenopathy Psych: Cooperative.  Mildly agitated at moments but overall pleasant.  Intake/Output from previous day: No intake/output data recorded. Intake/Output this shift: No intake/output data recorded.  LAB RESULTS: Recent Labs    12/19/21 2218 12/21/21 1834 12/22/21 0319  WBC 8.6 8.3 7.4  HGB 13.2 13.2 11.9*  HCT 42.1 42.5 37.1  PLT 198 166 161   BMET Lab Results  Component Value Date   NA 136 12/21/2021   NA 135 12/19/2021   NA 136 09/10/2021   K 3.7 12/21/2021   K 3.9 12/19/2021   K 4.8 09/10/2021   CL 94 (L) 12/21/2021   CL 91 (L) 12/19/2021   CL 96 (L) 09/10/2021   CO2 21 (L) 12/21/2021   CO2 30 12/19/2021   CO2 27 09/10/2021   GLUCOSE 90 12/21/2021   GLUCOSE 111 (H) 12/19/2021   GLUCOSE 111 (H) 09/10/2021   BUN 44 (H) 12/21/2021   BUN 13 12/19/2021   BUN 55 (H) 09/10/2021   CREATININE 11.08 (H) 12/21/2021   CREATININE 6.06 (H) 12/19/2021   CREATININE 8.30 (H) 09/10/2021   CALCIUM 9.6 12/21/2021   CALCIUM 9.4 12/19/2021   CALCIUM 9.2 09/10/2021   LFT Recent Labs    12/19/21 2218 12/21/21 1834  PROT 8.8* 8.3*  ALBUMIN 4.0 3.8  AST 32 27  ALT 14 9  ALKPHOS 76 70  BILITOT 0.6 0.3   PT/INR Lab Results  Component Value Date   INR 1.04 07/04/2017   INR 1.06 07/12/2016   INR 1.42 06/28/2016    Hepatitis Panel No results for input(s): "HEPBSAG", "HCVAB", "HEPAIGM", "HEPBIGM" in the last 72 hours. C-Diff No components found for: "CDIFF" Lipase     Component Value Date/Time   LIPASE 82 (H) 12/21/2021 1834    Drugs of Abuse     Component Value Date/Time   LABOPIA NONE DETECTED 10/08/2017 2337   COCAINSCRNUR NONE DETECTED 10/08/2017 2337   LABBENZ NONE DETECTED 10/08/2017 2337   AMPHETMU NONE DETECTED 10/08/2017 2337   THCU NONE DETECTED 10/08/2017 2337   LABBARB NONE DETECTED 10/08/2017 2337     RADIOLOGY STUDIES: CT ANGIO GI BLEED  Result Date: 12/22/2021 CLINICAL DATA:  Lower GI bleed.  Abdominal pain and blood in stool. EXAM: CTA ABDOMEN AND PELVIS WITHOUT AND WITH CONTRAST TECHNIQUE: Multidetector CT imaging of the abdomen and pelvis was performed using the standard protocol during bolus administration of intravenous contrast. Multiplanar reconstructed images and MIPs were obtained and reviewed to evaluate the vascular anatomy. RADIATION DOSE REDUCTION: This exam was performed according to the departmental dose-optimization program which includes automated exposure control, adjustment of the mA and/or kV according to patient size and/or use of iterative reconstruction technique. CONTRAST:  166m OMNIPAQUE IOHEXOL 350 MG/ML SOLN COMPARISON:  12/20/2021. FINDINGS: VASCULAR Aorta: Atherosclerotic calcification and mural thrombus  of the aorta. Normal caliber aorta without aneurysm, vasculitis, or significant stenosis. There is a calcified dissection flap in the distal abdominal aorta. Celiac: Patent without evidence of aneurysm, dissection, vasculitis or significant stenosis. SMA: Patent without evidence of aneurysm, dissection, vasculitis or significant stenosis. Renals: Atherosclerotic calcification at the renal ostia bilaterally. There is moderate stenosis on the right and severe stenosis on the left. No dissection, vasculitis, or fibromuscular dysplasia. IMA: Patent without  evidence of aneurysm, dissection, vasculitis or significant stenosis. Inflow: Atherosclerotic calcification and mural thrombus with no evidence of dissection. The common iliac and external iliac arteries demonstrate no significant stenosis, dissection, or aneurysm. There is high-grade stenosis with possible occlusion of the proximal internal iliac artery on the left with reconstitution. Proximal Outflow: Mild atherosclerotic calcification. Bilateral common femoral and visualized portions of the superficial and profunda femoral arteries are patent without evidence of aneurysm, dissection, vasculitis or significant stenosis. Veins: No obvious venous abnormality within the limitations of this arterial phase study. Review of the MIP images confirms the above findings. NON-VASCULAR Lower chest: Mild atelectasis or scarring is noted bilaterally. Pulmonary cysts are noted in the right lower lobe. Hepatobiliary: No focal liver abnormality. No biliary ductal dilatation. Stones are present within the gallbladder. Pancreas: Unremarkable. No pancreatic ductal dilatation or surrounding inflammatory changes. Spleen: Hypodensity is noted in the spleen measuring 1.5 cm, statistically most likely representing a cyst or hemangioma. Adrenals/Urinary Tract: There is nodularity of the right adrenal gland measuring 1.1 cm with an area of enhancement, best seen on portal venous phase, axial images 02/30/2033. The left adrenal gland is within normal limits. A cyst is noted in the mid left kidney. No renal calculus or hydronephrosis. The bladder is nondistended. Stomach/Bowel: There is mild gastric wall thickening. No bowel obstruction, free air, or pneumatosis. The appendix is not visualized on exam. No focal bowel wall thickening or surrounding inflammatory changes. Evaluation of the bowel is limited due to underdistention. No focal hemorrhage or contrast extravasation is identified. Lymphatic: No abdominal or pelvic lymphadenopathy.  Reproductive: Uterus and bilateral adnexa are unremarkable. Other: No abdominopelvic ascites. A fat containing inguinal hernia is noted. Musculoskeletal: No acute osseous abnormality. IMPRESSION: VASCULAR 1. No evidence of acute hemorrhage or contrast extravasation to suggest active hemorrhage. 2. Aortic atherosclerosis with no evidence of aneurysm. 3. Atherosclerotic calcification at the renal ostia bilaterally with moderate stenosis on the right and severe stenosis on the left. 4. High-grade stenosis/possible occlusion involving the proximal internal iliac artery on the left with reconstitution. NON-VASCULAR 1. No evidence of bowel obstruction, hemorrhage, or acute inflammatory changes. 2. Mild gastric wall thickening, possible gastritis. 3. Cholelithiasis. 4. Right adrenal nodule measuring 1.1 cm with enhancement on portal venous phase. Dedicated CT or MRI imaging with adrenal protocol is recommended. Electronically Signed   By: Brett Fairy M.D.   On: 12/22/2021 00:57      IMPRESSION:     Hematochezia w abd pain.  CT reveals possible gastritis with mild gastric wall thickening, uncomplicated cholelithiasis (this is longstanding), no evidence for colitis, diverticulosis.  Patient certainly at risk for ischemic related problems of the gut and the CT shows vascular disease involving both renal arteries, proximal internal iliac.    Corn Starch pica.    Drop in Hgb from 13.2..  11.9 but previous readings as low as 7.8 and generally readings around 8-11 earlier this year.  Given her overall health situation and her renal disease these Hgb's are not unexpected.  Has anemia of chronic kidney disease.  Receives  Mircera every 2 weeks at dialysis.  Lupus with multiple complications.  Peripheral neuropathy with bilateral lower extremity pain which is very bothersome to the patient and is affecting her ambulatory function   PLAN:     Colonoscopy and EGD.  Plan is for tomorrow.  Clear liquids now.  See  prep orders.   Azucena Freed  12/22/2021, 8:10 AM Phone [phone]

## 2021-12-22 NOTE — Hospital Course (Addendum)
Kathleen [name]. Kathleen [name] is a 61 year old female who presented with bright red blood per rectum.  Patient was admitted to family medicine teaching service with suspected lower GI bleed.  Please see problem based hospital course below:  GI bleed Patient presented after multiple episodes of bloody hematochezia which continued in ED.  Blood pressure was decreased to systolic 86V on admission.  This improved with a small 250 mL bolus of saline.  Hemoglobin on admission was 13.2.  Patient's baseline hemoglobin ranges from 10-11.  Consecutive CBC was 11.9.  GI was consulted.  They recommended upper and lower endoscopy. EGD showed multiple non-bleeding gastric and duodenal ulcers with negative H. Pylori on biopsy. Colonoscopy was normal with multiple polyps biopsied. GI recommended pantoprazole 40 mg BID for 8 weeks and reduction to 40 mg daily after for maintenance.  Follow-up EGD recommended in 8 to 10 weeks to check on ulcers.  ESRD Patient on scheduled dialysis TThSa. Nephrology consulted. Patient on normal dialysis schedule while in hospital.  Prolonged QT interval EKG on admission showed QTc of 540. Patient asymptomatic through admission. Repeat EKG demonstrate EKG of 503. Hydroxychloroquine was continued.

## 2021-12-22 NOTE — Consult Note (Addendum)
[facility] Consult: 8:10 AM 12/22/2021  LOS: 0 days    Referring Provider: Dr  Primary Care Physician:  Angela Burke, NP Primary Gastroenterologist:  unassigned  Friend and caregiver is Larry Sierras.  [phone]   Reason for Consultation:  hematochezia   HPI: Kathleen [name] is a 61 y.o. female.  PMH Lupus w nephritis, cerebritis, peripheral neuropthy.  .  ESRD, HD TTS.  Hypertension.  COPD. Admission early May 2023 with hypoxic, hypercapnic respiratory failure.  Seizures.  Admission 05/2021 with COVID-19, strep pneumo bacteremia, uremic encephalopathy, left wrist septic arthritis, after missing dialysis anemia of CKD, PRBCs in 06/2016 (1) and 06/2021 (1).  OSA.  TIA.  Cerebral vasculitis.  Cholelithiasis.  Fat-containing umbilical hernia.  Renal nephrocalcinosis.  Aortic atherosclerosis. R adnexal cyst.  CHF.  05/2018 three 2D echo with LVEF 45 to 61%, grade 1 diastolic dysfunction.  Normal pulmonary pressures.  At most mild valvular disease and regurgitation 07/2016 EGD.  Dr Oletta Lamas.  For dysphagia, abnormal CT (thickened gastric wall).  Study normal into examined duodenum. 05/2017 colonoscopy.  Dr. Steward Drone at [facility].  Unable to uncover procedure report or path.  Pt recalls nothing of polyps, diverticulosis, colitis.    Had diarrhea with intermittent hematochezia during her admission in late January.  This resolved. Meds at home: no NSAIDs, ASA, plavix, coumsdin etc. Not using previously RXd Carafate or PPI   ~ 3 d of hematochezia, diffuse abd pain eminating from [employer], n/v w occ pink tinged and dark emesis.  Before this 1 m of anorexia, 10 # wt loss (though wt loss for years).  No prior nausea, vomiting, abdominal pain.  Typically has 2 or 3 formed stools daily.  Her significant other/partner describes  episodes where she goes to the bathroom, has a bowel movement and comes out with weakness, difficulty walking, sweats followed by chills.  Patient consumes close to 2 boxes of [place] starch over a week's time.  This started eating started when she began dialysis.  She is gotten progressively weaker with shortness of breath, difficulty walking and balancing.  Some of the gait problem she attributes to chronic pain in her legs from the feet up into her groin.  She does not drink alcohol but she smokes nearly 2 packs of cigarettes daily.  Partner describes change in her behavior towards more aggressive, paranoid, confrontational.  CTAP wo contrast: No acute, no inflammatory process.  Chronic cholelithiasis.  Renal atrophy.  Aortic atherosclerosis.   CT head.  Chronic small vessel ischemia but nothing acute.   CXR: No acute dz. mild right-sided atelectasis. Hgb 11.9.  WBCs, MCV, platelets normal.  Lipase 82, amylase 465.  T. bili, alk phos, AST/ALT normal. BNP 3248.  Trop I 35.    FM Hx: breast Ca, ovarian ca, throat ca.   Patient left school at 10th grade and never read very well though she is not completely illiterate.  Past Medical History:  Diagnosis Date   Anemia    Arthritis    Cardiac arrest ([place])    Cellulitis  Cerebral vasculitis    COPD (chronic obstructive pulmonary disease) (HCC)    Elevated antinuclear antibody (ANA) level    Endometriosis    ESRD (end stage renal disease) on dialysis ([place])    Gallstones    Hypertension    Lupus (HCC)    Migraine    PONV (postoperative nausea and vomiting)    Seizures ([place])    Sepsis ([place])    Sleep apnea    Suicide attempt ([place])    TB (tuberculosis)    Transient ischemic attack (TIA)    Vasculitis of skin     Past Surgical History:  Procedure Laterality Date   AV FISTULA PLACEMENT Left 07/04/2017   Procedure: ARTERIOVENOUS (AV) FISTULA CREATION;  Surgeon: Conrad Dauphin, MD;  Location: [place];  Service: Vascular;  Laterality: Left;   AV  FISTULA PLACEMENT Left 11/27/2019   Procedure: INSERTION OF ARTERIOVENOUS (AV) GORE-TEX GRAFT LEFT UPPER ARM;  Surgeon: Angelia Mould, MD;  Location: [place];  Service: Vascular;  Laterality: Left;   AV FISTULA PLACEMENT Left 07/29/2020   Procedure: INSERTION OF LEFT UPPER ARM ARTERIOVENOUS (AV) GORE-TEX GRAFT;  Surgeon: Angelia Mould, MD;  Location: [place];  Service: Vascular;  Laterality: Left;   BUBBLE STUDY  05/25/2021   Procedure: BUBBLE STUDY;  Surgeon: Geralynn Rile, MD;  Location: [place];  Service: Cardiovascular;;   CESAREAN SECTION     ESOPHAGOGASTRODUODENOSCOPY N/A 07/15/2016   Procedure: ESOPHAGOGASTRODUODENOSCOPY (EGD);  Surgeon: Laurence Spates, MD;  [facility] ENDOSCOPY;  Service: Endoscopy;  Laterality: N/A;   I & D EXTREMITY Left 05/27/2021   Procedure: IRRIGATION AND DEBRIDEMENT OF WRIST;  Surgeon: Iran Planas, MD;  Location: [place];  Service: Orthopedics;  Laterality: Left;   INSERTION OF DIALYSIS CATHETER Right 07/04/2017   Procedure: INSERTION OF TUNNELED  DIALYSIS CATHETER - RIGHT INTERNAL JUGULAR PLACEMENT;  Surgeon: Conrad Ashton, MD;  Location: [place];  Service: Vascular;  Laterality: Right;   IR FLUORO GUIDE CV LINE RIGHT  09/08/2021   IR THROMBECTOMY AV FISTULA W/THROMBOLYSIS/PTA INC/SHUNT/IMG LEFT Left 09/10/2021   IR US GUIDE VASC ACCESS LEFT  09/10/2021   IR US GUIDE VASC ACCESS RIGHT  09/08/2021   LUNG BIOPSY     TEE WITHOUT CARDIOVERSION N/A 05/25/2021   Procedure: TRANSESOPHAGEAL ECHOCARDIOGRAM (TEE);  Surgeon: Geralynn Rile, MD;  Location: [place];  Service: Cardiovascular;  Laterality: N/A;   TUBAL LIGATION      Prior to Admission medications   Medication Sig Start Date End Date Taking? Authorizing Provider  acetaminophen (TYLENOL) 325 MG tablet Take 650 mg by mouth every 6 (six) hours as needed for pain.   Yes [provider]  amLODipine (NORVASC) 10 MG tablet Take 1 tablet (10 mg total) by mouth daily. 06/04/21  Yes  Vann, Jessica U, DO  carvedilol (COREG) 3.125 MG tablet Take 1 tablet (3.125 mg total) by mouth 2 (two) times daily with a meal. Patient taking differently: Take 6.25 mg by mouth 2 (two) times daily with a meal. 06/04/21  Yes Vann, Jessica U, DO  hydrALAZINE (APRESOLINE) 50 MG tablet Take 1 tablet (50 mg total) by mouth every 12 (twelve) hours. 09/11/21 12/23/22 Yes Arrien, Jimmy Picket, MD  hydroxychloroquine (PLAQUENIL) 200 MG tablet Take 200 mg by mouth daily. 09/06/21  Yes [provider]  multivitamin (RENA-VIT) TABS tablet Take 1 tablet by mouth daily. 08/21/21  Yes [provider]  sevelamer carbonate (RENVELA) 800 MG tablet Take 800-2,400 mg by mouth See admin instructions.  2400 mg with meals 800 mg with snacks 08/28/21  Yes [provider]  ondansetron (ZOFRAN) 4 MG tablet Take 1 tablet (4 mg total) by mouth every 8 (eight) hours as needed for nausea or vomiting. Patient not taking: Reported on 12/22/2021 12/20/21   Wynona Dove A, DO  sucralfate (CARAFATE) 1 g tablet Take 1 tablet (1 g total) by mouth 4 (four) times daily -  with meals and at bedtime for 7 days. Patient not taking: Reported on 12/22/2021 12/20/21 12/27/21  Wynona Dove A, DO    Scheduled Meds:  hydroxychloroquine  200 mg Oral Daily   multivitamin  1 tablet Oral QHS   nicotine  14 mg Transdermal Daily   sevelamer carbonate  2,400 mg Oral TID WC   Infusions:  PRN Meds: acetaminophen **OR** acetaminophen, sevelamer carbonate   Allergies as of 12/21/2021   (No Known Allergies)    Family History  Problem Relation Age of Onset   Ovarian cancer Mother    Breast cancer Sister    Throat cancer Brother     Social History   Socioeconomic History   Marital status: Significant Other    Spouse name: Not on file   Number of children: Not on file   Years of education: Not on file   Highest education level: Not on file  Occupational History   Not on file  Tobacco Use   Smoking status: Some  Days    Packs/day: 0.25    Types: Cigarettes   Smokeless tobacco: Never  Vaping Use   Vaping Use: Never used  Substance and Sexual Activity   Alcohol use: No   Drug use: No   Sexual activity: Not Currently    Birth control/protection: Post-menopausal  Other Topics Concern   Not on file  Social History Narrative   Not on file   Social Determinants of Health   Financial Resource Strain: Not on file  Food Insecurity: Not on file  Transportation Needs: Not on file  Physical Activity: Not on file  Stress: Not on file  Social Connections: Not on file  Intimate Partner Violence: Not on file    REVIEW OF SYSTEMS: Constitutional: Weakness. ENT:  No nose bleeds Pulm: Dyspnea on exertion CV:  No palpitations, no LE edema.  No angina GU: An uric GI: See HPI Heme: Denies unusual or excessive bleeding or bruising.  No dysphagia. Transfusions:  as per HPI. Neuro:  No headaches, no peripheral tingling or numbness.  Mild dizziness.  No recent seizure.  Derm:  No itching, no rash or sores.  Endocrine:  No sweats or chills.  No polyuria or dysuria Immunization:  reviewed Travel:  Not queried.     PHYSICAL EXAM: Vital signs in last 24 hours: Vitals:   12/22/21 0600 12/22/21 0734  BP: (!) 96/55   Pulse: 72   Resp: 15   Temp:  98.6 F (37 C)  SpO2: 100%    Wt Readings from Last 3 Encounters:  12/21/21 50.5 kg  12/19/21 50.4 kg  09/11/21 50.3 kg    General: Patient is thin looks severely chronically ill, cachectic.  She is alert and appears comfortable. Head: No facial asymmetry or swelling.  No signs of head trauma.  Prominent bony architecture in her face consistent with cachexia. Eyes: Conjunctiva pink.  Sclera are muddy. Ears: No obvious hearing loss. Nose: No congestion or discharge Mouth: Tongue is coated with a beige-white material,?  Candida.  Mucosa is pink, moist, clear.  Upper dentures in place  not removed.  Remaining teeth in poor condition.  Tongue  midline. Neck: No JVD, no masses, no thyromegaly Lungs: Diminished breath sounds but overall clear.  No rhonchi or upper airway noises.  Unlabored breathing at rest.  No cough during encounter. Heart: RRR.  No MRG.  S1, S2 present Abdomen: Thin, soft.  Diffusely tender without guarding or rebound.  No masses, HSM, bruits, obvious hernias..   Rectal: No visible or palpable masses.  Exam glove devoid of stool or blood. Musc/Skeltl: No joint redness or swelling. Extremities: No CCE. Neurologic: Oriented x3.  Rambling historian.  Moves all 4 limbs without tremor, strength not tested. Skin: No rash, no sores, no suspicious lesions. Nodes: No cervical adenopathy Psych: Cooperative.  Mildly agitated at moments but overall pleasant.  Intake/Output from previous day: No intake/output data recorded. Intake/Output this shift: No intake/output data recorded.  LAB RESULTS: Recent Labs    12/19/21 2218 12/21/21 1834 12/22/21 0319  WBC 8.6 8.3 7.4  HGB 13.2 13.2 11.9*  HCT 42.1 42.5 37.1  PLT 198 166 161   BMET Lab Results  Component Value Date   NA 136 12/21/2021   NA 135 12/19/2021   NA 136 09/10/2021   K 3.7 12/21/2021   K 3.9 12/19/2021   K 4.8 09/10/2021   CL 94 (L) 12/21/2021   CL 91 (L) 12/19/2021   CL 96 (L) 09/10/2021   CO2 21 (L) 12/21/2021   CO2 30 12/19/2021   CO2 27 09/10/2021   GLUCOSE 90 12/21/2021   GLUCOSE 111 (H) 12/19/2021   GLUCOSE 111 (H) 09/10/2021   BUN 44 (H) 12/21/2021   BUN 13 12/19/2021   BUN 55 (H) 09/10/2021   CREATININE 11.08 (H) 12/21/2021   CREATININE 6.06 (H) 12/19/2021   CREATININE 8.30 (H) 09/10/2021   CALCIUM 9.6 12/21/2021   CALCIUM 9.4 12/19/2021   CALCIUM 9.2 09/10/2021   LFT Recent Labs    12/19/21 2218 12/21/21 1834  PROT 8.8* 8.3*  ALBUMIN 4.0 3.8  AST 32 27  ALT 14 9  ALKPHOS 76 70  BILITOT 0.6 0.3   PT/INR Lab Results  Component Value Date   INR 1.04 07/04/2017   INR 1.06 07/12/2016   INR 1.42 06/28/2016    Hepatitis Panel No results for input(s): "HEPBSAG", "HCVAB", "HEPAIGM", "HEPBIGM" in the last 72 hours. C-Diff No components found for: "CDIFF" Lipase     Component Value Date/Time   LIPASE 82 (H) 12/21/2021 1834    Drugs of Abuse     Component Value Date/Time   LABOPIA NONE DETECTED 10/08/2017 2337   COCAINSCRNUR NONE DETECTED 10/08/2017 2337   LABBENZ NONE DETECTED 10/08/2017 2337   AMPHETMU NONE DETECTED 10/08/2017 2337   THCU NONE DETECTED 10/08/2017 2337   LABBARB NONE DETECTED 10/08/2017 2337     RADIOLOGY STUDIES: CT ANGIO GI BLEED  Result Date: 12/22/2021 CLINICAL DATA:  Lower GI bleed.  Abdominal pain and blood in stool. EXAM: CTA ABDOMEN AND PELVIS WITHOUT AND WITH CONTRAST TECHNIQUE: Multidetector CT imaging of the abdomen and pelvis was performed using the standard protocol during bolus administration of intravenous contrast. Multiplanar reconstructed images and MIPs were obtained and reviewed to evaluate the vascular anatomy. RADIATION DOSE REDUCTION: This exam was performed according to the departmental dose-optimization program which includes automated exposure control, adjustment of the mA and/or kV according to patient size and/or use of iterative reconstruction technique. CONTRAST:  166m OMNIPAQUE IOHEXOL 350 MG/ML SOLN COMPARISON:  12/20/2021. FINDINGS: VASCULAR Aorta: Atherosclerotic calcification and mural thrombus  of the aorta. Normal caliber aorta without aneurysm, vasculitis, or significant stenosis. There is a calcified dissection flap in the distal abdominal aorta. Celiac: Patent without evidence of aneurysm, dissection, vasculitis or significant stenosis. SMA: Patent without evidence of aneurysm, dissection, vasculitis or significant stenosis. Renals: Atherosclerotic calcification at the renal ostia bilaterally. There is moderate stenosis on the right and severe stenosis on the left. No dissection, vasculitis, or fibromuscular dysplasia. IMA: Patent without  evidence of aneurysm, dissection, vasculitis or significant stenosis. Inflow: Atherosclerotic calcification and mural thrombus with no evidence of dissection. The common iliac and external iliac arteries demonstrate no significant stenosis, dissection, or aneurysm. There is high-grade stenosis with possible occlusion of the proximal internal iliac artery on the left with reconstitution. Proximal Outflow: Mild atherosclerotic calcification. Bilateral common femoral and visualized portions of the superficial and profunda femoral arteries are patent without evidence of aneurysm, dissection, vasculitis or significant stenosis. Veins: No obvious venous abnormality within the limitations of this arterial phase study. Review of the MIP images confirms the above findings. NON-VASCULAR Lower chest: Mild atelectasis or scarring is noted bilaterally. Pulmonary cysts are noted in the right lower lobe. Hepatobiliary: No focal liver abnormality. No biliary ductal dilatation. Stones are present within the gallbladder. Pancreas: Unremarkable. No pancreatic ductal dilatation or surrounding inflammatory changes. Spleen: Hypodensity is noted in the spleen measuring 1.5 cm, statistically most likely representing a cyst or hemangioma. Adrenals/Urinary Tract: There is nodularity of the right adrenal gland measuring 1.1 cm with an area of enhancement, best seen on portal venous phase, axial images 02/30/2033. The left adrenal gland is within normal limits. A cyst is noted in the mid left kidney. No renal calculus or hydronephrosis. The bladder is nondistended. Stomach/Bowel: There is mild gastric wall thickening. No bowel obstruction, free air, or pneumatosis. The appendix is not visualized on exam. No focal bowel wall thickening or surrounding inflammatory changes. Evaluation of the bowel is limited due to underdistention. No focal hemorrhage or contrast extravasation is identified. Lymphatic: No abdominal or pelvic lymphadenopathy.  Reproductive: Uterus and bilateral adnexa are unremarkable. Other: No abdominopelvic ascites. A fat containing inguinal hernia is noted. Musculoskeletal: No acute osseous abnormality. IMPRESSION: VASCULAR 1. No evidence of acute hemorrhage or contrast extravasation to suggest active hemorrhage. 2. Aortic atherosclerosis with no evidence of aneurysm. 3. Atherosclerotic calcification at the renal ostia bilaterally with moderate stenosis on the right and severe stenosis on the left. 4. High-grade stenosis/possible occlusion involving the proximal internal iliac artery on the left with reconstitution. NON-VASCULAR 1. No evidence of bowel obstruction, hemorrhage, or acute inflammatory changes. 2. Mild gastric wall thickening, possible gastritis. 3. Cholelithiasis. 4. Right adrenal nodule measuring 1.1 cm with enhancement on portal venous phase. Dedicated CT or MRI imaging with adrenal protocol is recommended. Electronically Signed   By: Brett Fairy M.D.   On: 12/22/2021 00:57      IMPRESSION:     Hematochezia w abd pain.  CT reveals possible gastritis with mild gastric wall thickening, uncomplicated cholelithiasis (this is longstanding), no evidence for colitis, diverticulosis.  Patient certainly at risk for ischemic related problems of the gut and the CT shows vascular disease involving both renal arteries, proximal internal iliac.    Corn Starch pica.    Drop in Hgb from 13.2..  11.9 but previous readings as low as 7.8 and generally readings around 8-11 earlier this year.  Given her overall health situation and her renal disease these Hgb's are not unexpected.  Has anemia of chronic kidney disease.  Receives  Mircera every 2 weeks at dialysis.  Lupus with multiple complications.  Peripheral neuropathy with bilateral lower extremity pain which is very bothersome to the patient and is affecting her ambulatory function   PLAN:     Colonoscopy and EGD.  Plan is for tomorrow.  Clear liquids now.  See  prep orders.   Azucena Freed  12/22/2021, 8:10 AM Phone [phone]

## 2021-12-22 NOTE — ED Notes (Addendum)
Report given to 2W RN; pt still in dialysis, Dialysis nurse notified that pt can be taken to 2W room 26 when finished

## 2021-12-22 NOTE — ED Notes (Signed)
Patient is currently disconnected from vital signs and cleaning up at the sink.

## 2021-12-22 NOTE — Consult Note (Signed)
ESRD Consult Note  Assessment/Recommendations:   # ESRD:  -outpatient HD orders: [name]. 3hrs 44mn. F160. Flow rates: 350/autoflow 1.5. 2k/2cal. UF profile 2. AVG. Meds: sensipar '60mg'$  qtreatment. Heparin 3600 units bolus. Mircera 725m q2weeks (supposed to start 8/15) -HD today on TTS schedule, no heparin  # Hypotension Volume/ h/o hypertension: -low BP likely related to bleed + vomiting/poor po intake. Home anti-HTN on hold, BP on the softer side but stable, relatively euvolemic on exam -will need to readjust EDW post d/c (EDW is being lowered as an outpatient)  # BRBPR, Anemia of Chronic Kidney Disease: Hemoglobin 11.9, has not received esa's yet as an outpatient. Hgb currently at goal, will hold off on starting ESAs for now. -GI consulted  # Secondary Hyperparathyroidism/Hyperphosphatemia: resume sensipar. Renvela resumed   # Additional recommendations: - Dose all meds for creatinine clearance < 10 ml/min  - Unless absolutely necessary, no MRIs with gadolinium.  - Implement save arm precautions.  Prefer needle sticks in the dorsum of the hands or wrists.  No blood pressure measurements in arm. - If blood transfusion is requested during hemodialysis sessions, please alert [facility] to the session.  - If a hemodialysis catheter line culture is requested, please alert [facility] only hemodialysis nurses are able to collect those specimens.   ViGean QuintMD [facility]  History of Present Illness: Kathleen [name] a/an [age].o. female with a past medical history of ESRD, HTN, SLE, h/o seizure d/o, TIA who presents with BRBPR and abd pain x 3 days. Was also having vomiting prior to admit. Hdb 13.2 on admission. BP in ER was 80s/60s which improved with small NS bolus. CTA did not reveal a definitive source of bleeding. Last HD was on 8/12. Patient currently endorses hunger, NPO for possible procedure. Otherwise no other complaints currently.  Medications:  Current  Facility-Administered Medications  Medication Dose Route Frequency Provider Last Rate Last Admin   acetaminophen (TYLENOL) tablet 650 mg  650 mg Oral Q6H PRN BrLeslie DalesMD       Or   acetaminophen (TYLENOL) suppository 650 mg  650 mg Rectal Q6H PRN BrLeslie DalesMD       Chlorhexidine Gluconate Cloth 2 % PADS 6 each  6 each Topical Q0600 SiGean QuintMD       hydroxychloroquine (PLAQUENIL) tablet 200 mg  200 mg Oral Daily SuZola ButtonMD       multivitamin (RENA-VIT) tablet 1 tablet  1 tablet Oral QHS SuZola ButtonMD       nicotine (NICODERM CQ - dosed in mg/24 hours) patch 14 mg  14 mg Transdermal Daily BrLeslie DalesMD       sevelamer carbonate (RENVELA) tablet 2,400 mg  2,400 mg Oral TID WC SuZola ButtonMD       sevelamer carbonate (RENVELA) tablet 800 mg  800 mg Oral PRN PrLenoria ChimeMD       Current Outpatient Medications  Medication Sig Dispense Refill   acetaminophen (TYLENOL) 325 MG tablet Take 650 mg by mouth every 6 (six) hours as needed for pain.     amLODipine (NORVASC) 10 MG tablet Take 1 tablet (10 mg total) by mouth daily. 30 tablet 0   carvedilol (COREG) 3.125 MG tablet Take 1 tablet (3.125 mg total) by mouth 2 (two) times daily with a meal. (Patient taking differently: Take 6.25 mg by mouth 2 (two) times daily with a meal.) 60 tablet 0   hydrALAZINE (APRESOLINE) 50 MG tablet  Take 1 tablet (50 mg total) by mouth every 12 (twelve) hours. 60 tablet 0   hydroxychloroquine (PLAQUENIL) 200 MG tablet Take 200 mg by mouth daily.     multivitamin (RENA-VIT) TABS tablet Take 1 tablet by mouth daily.     sevelamer carbonate (RENVELA) 800 MG tablet Take 800-2,400 mg by mouth See admin instructions. 2400 mg with meals 800 mg with snacks     ondansetron (ZOFRAN) 4 MG tablet Take 1 tablet (4 mg total) by mouth every 8 (eight) hours as needed for nausea or vomiting. (Patient not taking: Reported on 12/22/2021) 5 tablet 0   sucralfate (CARAFATE) 1 g tablet Take 1 tablet (1  g total) by mouth 4 (four) times daily -  with meals and at bedtime for 7 days. (Patient not taking: Reported on 12/22/2021) 28 tablet 0     ALLERGIES Patient has no known allergies.  MEDICAL HISTORY Past Medical History:  Diagnosis Date   Anemia    Arthritis    Cardiac arrest (HCC)    Cellulitis    Cerebral vasculitis    COPD (chronic obstructive pulmonary disease) (HCC)    Elevated antinuclear antibody (ANA) level    Endometriosis    ESRD (end stage renal disease) on dialysis (HCC)    Gallstones    Hypertension    Lupus (HCC)    Migraine    PONV (postoperative nausea and vomiting)    Seizures (HCC)    Sepsis (HCC)    Sleep apnea    Suicide attempt ([place])    TB (tuberculosis)    Transient ischemic attack (TIA)    Vasculitis of skin      SOCIAL HISTORY Social History   Socioeconomic History   Marital status: Significant Other    Spouse name: Not on file   Number of children: Not on file   Years of education: Not on file   Highest education level: Not on file  Occupational History   Not on file  Tobacco Use   Smoking status: Some Days    Packs/day: 0.25    Types: Cigarettes   Smokeless tobacco: Never  Vaping Use   Vaping Use: Never used  Substance and Sexual Activity   Alcohol use: No   Drug use: No   Sexual activity: Not Currently    Birth control/protection: Post-menopausal  Other Topics Concern   Not on file  Social History Narrative   Not on file   Social Determinants of Health   Financial Resource Strain: Not on file  Food Insecurity: Not on file  Transportation Needs: Not on file  Physical Activity: Not on file  Stress: Not on file  Social Connections: Not on file  Intimate Partner Violence: Not on file     FAMILY HISTORY Family History  Problem Relation Age of Onset   Ovarian cancer Mother    Breast cancer Sister    Throat cancer Brother      Review of Systems: 12 systems were reviewed and negative except per HPI  Physical  Exam: Vitals:   12/22/21 0600 12/22/21 0734  BP: (!) 96/55   Pulse: 72   Resp: 15   Temp:  98.6 F (37 C)  SpO2: 100%    No intake/output data recorded. No intake or output data in the 24 hours ending 12/22/21 0900 General: well-appearing, no acute distress, laying flat in bed HEENT: anicteric sclera, MMM CV: normal rate, no murmurs Lungs: CTA BL, bilateral chest rise, normal wob Abd: soft, non-tender, non-distended Ext: no  sig edema b/l LEs Neuro: normal speech, no gross focal deficits  Dialysis access: LUE AVG +b/t  Test Results Reviewed Lab Results  Component Value Date   NA 136 12/21/2021   K 3.7 12/21/2021   CL 94 (L) 12/21/2021   CO2 21 (L) 12/21/2021   BUN 44 (H) 12/21/2021   CREATININE 11.08 (H) 12/21/2021   CALCIUM 9.6 12/21/2021   ALBUMIN 3.8 12/21/2021   PHOS 3.1 09/09/2021    I have reviewed relevant outside healthcare records

## 2021-12-22 NOTE — Assessment & Plan Note (Addendum)
Likely incidental finding on CTA. - recommended dedicated CT or MRI adrenal protocol, outpatient follow-up

## 2021-12-22 NOTE — ED Provider Notes (Signed)
  Physical Exam  BP 96/63   Pulse 75   Temp 98.6 F (37 C)   Resp 16   Ht '5\' 4"'$  (1.626 m)   Wt 50.5 kg   SpO2 99%   BMI 19.11 kg/m   Physical Exam  Procedures  Procedures  ED Course / MDM   Clinical Course as of 12/22/21 0313  Tue Dec 22, 2021  0218 Consult to family medicine who are agreeable to admitting this patient to their service.  I appreciate their collaboration in care of this patient. [RS]    Clinical Course User Index [RS] Gissela Bloch, Gypsy Balsam, PA-C   Medical Decision Making Amount and/or Complexity of Data Reviewed Labs: ordered. Radiology: ordered.  Risk Prescription drug management. Decision regarding hospitalization.    Care of this patient assumed from preceding ED provider, Britni Henderly, PA-C at time of shift change. Please see her associated note for further insight into the patient's ED course. In brief,  patient is a 61 year old female with ESRD on HD who presents with concern for GI bleed x 5 days.  Patient does endorse history of GI bleeding in the past but states she did not follow-up In the outpatient setting.  Last EGD visible in the system in 2018 which was normal.  She states she is unsure when last colonoscopy was.  CT Noncon from 48 hours ago in the ED negative for acute intra-abdominal pelvic abnormality.  Today, CBC without leukocytosis or anemia.  CMP with baseline creatinine of 11, bicarb mildly low to 21, anion gap elevated to 21.  Lipase is mildly elevated to 82.  Hemoccult is positive.  At time of shift change, patient is awaiting CTA GI bleed with pl breast satisfaction.  An for admission to the hospital for GI bleeding following CTA  CT angiogram GI bleed protocol negative for acute hemorrhage however require intervention at this time.  No acute intra-abdominal pelvic abnormalities noted.  Patient reports 2 more bloody bowel movements in the ED since shift change.  Do feel admission is reasonable at this time.  GI informed via secure  message per protocol, [facility] who saw patient in the inpatient side in 2018.  Consult called pending for medical admission at this time.  Lashanda and her husband voiced understanding of her medical evaluation and treatment plan thus far.  Each of their questions answered to their expressed satisfaction.  They are amenable to plan for admission at this time.   Consult to family medicine as above. Patient resting calmly in her bed at this time. Type and screen and repeat CBC pending at time of admission.   This chart was dictated using voice recognition software, Dragon. Despite the best efforts of this provider to proofread and correct errors, errors may still occur which can change documentation meaning.       Aura Dials 12/22/21 7341    Ezequiel Essex, MD 12/22/21 [phone]

## 2021-12-22 NOTE — Assessment & Plan Note (Addendum)
Multiple readings of blood pressures greater than 140/90. - restart home coreg  - continue holding hydralazine, and amlodipine

## 2021-12-22 NOTE — ED Notes (Signed)
ED TO INPATIENT HANDOFF REPORT  ED Nurse Name and Phone #: [phone]  S Name/Age/Gender Kathleen [name] 61 y.o. female Room/Bed: 013C/013C  Code Status   Code Status: Full Code  Home/SNF/Other Home Patient oriented to: self, place, time, and situation Is this baseline? Yes   Triage Complete: Triage complete  Chief Complaint GI bleed [K92.2]  Triage Note Patient arrives via EMS from parking lot with complaints of abdominal pain and blood in stool per patient. Patient was seen yesterday for same. Patient dialysis patient for Tues/Thurs/Sat.   Allergies No Known Allergies  Level of Care/Admitting Diagnosis ED Disposition     ED Disposition  Admit   Condition  --   Comment  Hospital Area: [place] [[id number]]  Level of Care: Med-Surg [16]  May place patient in observation at [facility] or [place] if equivalent level of care is available:: Yes  Covid Evaluation: Asymptomatic - no recent exposure (last 10 days) testing not required  Diagnosis: GI bleed [703500]  Admitting Physician: Leslie Dales [[id number]]  Attending Physician: Lenoria Chime [[id number]]          B Medical/Surgery History Past Medical History:  Diagnosis Date   Anemia    Arthritis    Cardiac arrest ([place])    Cellulitis    Cerebral vasculitis    COPD (chronic obstructive pulmonary disease) ([place])    Elevated antinuclear antibody (ANA) level    Endometriosis    ESRD (end stage renal disease) on dialysis ([place])    Gallstones    Hypertension    Lupus ([place])    Migraine    PONV (postoperative nausea and vomiting)    Seizures ([place])    Sepsis ([place])    Sleep apnea    Suicide attempt ([place])    TB (tuberculosis)    Transient ischemic attack (TIA)    Vasculitis of skin    Past Surgical History:  Procedure Laterality Date   AV FISTULA PLACEMENT Left 07/04/2017   Procedure: ARTERIOVENOUS (AV) FISTULA CREATION;  Surgeon: Conrad Farmington, MD;  Location: [place];  Service: Vascular;   Laterality: Left;   AV FISTULA PLACEMENT Left 11/27/2019   Procedure: INSERTION OF ARTERIOVENOUS (AV) GORE-TEX GRAFT LEFT UPPER ARM;  Surgeon: Angelia Mould, MD;  Location: [place];  Service: Vascular;  Laterality: Left;   AV FISTULA PLACEMENT Left 07/29/2020   Procedure: INSERTION OF LEFT UPPER ARM ARTERIOVENOUS (AV) GORE-TEX GRAFT;  Surgeon: Angelia Mould, MD;  Location: [place];  Service: Vascular;  Laterality: Left;   BUBBLE STUDY  05/25/2021   Procedure: BUBBLE STUDY;  Surgeon: Geralynn Rile, MD;  Location: [place];  Service: Cardiovascular;;   CESAREAN SECTION     ESOPHAGOGASTRODUODENOSCOPY N/A 07/15/2016   Procedure: ESOPHAGOGASTRODUODENOSCOPY (EGD);  Surgeon: Laurence Spates, MD;  Location[facility] ENDOSCOPY;  Service: Endoscopy;  Laterality: N/A;   I & D EXTREMITY Left 05/27/2021   Procedure: IRRIGATION AND DEBRIDEMENT OF WRIST;  Surgeon: Iran Planas, MD;  Location: [place];  Service: Orthopedics;  Laterality: Left;   INSERTION OF DIALYSIS CATHETER Right 07/04/2017   Procedure: INSERTION OF TUNNELED  DIALYSIS CATHETER - RIGHT INTERNAL JUGULAR PLACEMENT;  Surgeon: Conrad Oxford, MD;  Location: [place];  Service: Vascular;  Laterality: Right;   IR FLUORO GUIDE CV LINE RIGHT  09/08/2021   IR THROMBECTOMY AV FISTULA W/THROMBOLYSIS/PTA INC/SHUNT/IMG LEFT Left 09/10/2021   IR US GUIDE VASC ACCESS LEFT  09/10/2021   IR US GUIDE VASC ACCESS RIGHT  09/08/2021  LUNG BIOPSY     TEE WITHOUT CARDIOVERSION N/A 05/25/2021   Procedure: TRANSESOPHAGEAL ECHOCARDIOGRAM (TEE);  Surgeon: Geralynn Rile, MD;  Location: [place];  Service: Cardiovascular;  Laterality: N/A;   TUBAL LIGATION       A IV Location/Drains/Wounds Patient Lines/Drains/Airways Status     Active Line/Drains/Airways     Name Placement date Placement time Site Days   Peripheral IV 12/21/21 22 G Anterior;Proximal;Right Antecubital 12/21/21  1855  Antecubital  1   Peripheral IV 12/21/21 18 G 1.88"  Anterior;Proximal;Right;Upper Arm 12/21/21  2047  Arm  1   Fistula / Graft Left Upper arm Arteriovenous vein graft 05/16/21  1535  Upper arm  220   Hemodialysis Catheter Right Internal jugular Double lumen Permanent (Tunneled) 09/08/21  1149  Internal jugular  105   Incision (Closed) 07/29/20 Arm Left 07/29/20  1134  -- 511   Incision (Closed) 05/27/21 Arm Left 05/27/21  1859  -- 209   Wound / Incision (Open or Dehisced) 05/16/21 (MASD) Moisture Associated Skin Damage Thigh Anterior;Proximal;Right 05/16/21  1540  Thigh  220   Wound / Incision (Open or Dehisced) 05/16/21 (MASD) Moisture Associated Skin Damage Buttocks Right;Left 05/16/21  1530  Buttocks  220            Intake/Output Last 24 hours No intake or output data in the 24 hours ending 12/22/21 1405  Labs/Imaging Results for orders placed or performed during the hospital encounter of 12/21/21 (from the past 48 hour(s))  CBC with Differential     Status: Abnormal   Collection Time: 12/21/21  6:34 PM  Result Value Ref Range   WBC 8.3 4.0 - 10.5 K/uL   RBC 4.62 3.87 - 5.11 MIL/uL   Hemoglobin 13.2 12.0 - 15.0 g/dL   HCT 42.5 36.0 - 46.0 %   MCV 92.0 80.0 - 100.0 fL   MCH 28.6 26.0 - 34.0 pg   MCHC 31.1 30.0 - 36.0 g/dL   RDW 18.4 (H) 11.5 - 15.5 %   Platelets 166 150 - 400 K/uL   nRBC 0.0 0.0 - 0.2 %   Neutrophils Relative % 64 %   Neutro Abs 5.4 1.7 - 7.7 K/uL   Lymphocytes Relative 23 %   Lymphs Abs 1.9 0.7 - 4.0 K/uL   Monocytes Relative 10 %   Monocytes Absolute 0.9 0.1 - 1.0 K/uL   Eosinophils Relative 2 %   Eosinophils Absolute 0.1 0.0 - 0.5 K/uL   Basophils Relative 1 %   Basophils Absolute 0.1 0.0 - 0.1 K/uL   Immature Granulocytes 0 %   Abs Immature Granulocytes 0.03 0.00 - 0.07 K/uL    Comment: Performed at [facility], [address] [place], [place] [address]  Comprehensive metabolic panel     Status: Abnormal   Collection Time: 12/21/21  6:34 PM  Result Value Ref Range   Sodium 136 135 - 145  mmol/L   Potassium 3.7 3.5 - 5.1 mmol/L   Chloride 94 (L) 98 - 111 mmol/L   CO2 21 (L) 22 - 32 mmol/L   Glucose, Bld 90 70 - 99 mg/dL    Comment: Glucose reference range applies only to samples taken after fasting for at least 8 hours.   BUN 44 (H) 8 - 23 mg/dL   Creatinine, Ser 11.08 (H) 0.44 - 1.00 mg/dL    Comment: DELTA CHECK NOTED   Calcium 9.6 8.9 - 10.3 mg/dL   Total Protein 8.3 (H) 6.5 - 8.1 g/dL  Albumin 3.8 3.5 - 5.0 g/dL   AST 27 15 - 41 U/L   ALT 9 0 - 44 U/L   Alkaline Phosphatase 70 38 - 126 U/L   Total Bilirubin 0.3 0.3 - 1.2 mg/dL   GFR, Estimated 4 (L) >60 mL/min    Comment: (NOTE) Calculated using the CKD-EPI Creatinine Equation (2021)    Anion gap 21 (H) 5 - 15    Comment: Performed at [facility] [address] [place], [place] [address]  Lipase, blood     Status: Abnormal   Collection Time: 12/21/21  6:34 PM  Result Value Ref Range   Lipase 82 (H) 11 - 51 U/L    Comment: Performed at [facility] [address] [place], [place] [address]  POC occult blood, ED     Status: Abnormal   Collection Time: 12/21/21  7:59 PM  Result Value Ref Range   Fecal Occult Bld POSITIVE (A) NEGATIVE  Type and screen [place]     Status: None   Collection Time: 12/22/21  3:18 AM  Result Value Ref Range   ABO/RH(D) B POS    Antibody Screen NEG    Sample Expiration      12/25/2021,2359 Performed at [facility], [place] [address], [place], [place] [address]   CBC     Status: Abnormal   Collection Time: 12/22/21  3:19 AM  Result Value Ref Range   WBC 7.4 4.0 - 10.5 K/uL   RBC 4.13 3.87 - 5.11 MIL/uL   Hemoglobin 11.9 (L) 12.0 - 15.0 g/dL   HCT 37.1 36.0 - 46.0 %   MCV 89.8 80.0 - 100.0 fL   MCH 28.8 26.0 - 34.0 pg   MCHC 32.1 30.0 - 36.0 g/dL   RDW 18.1 (H) 11.5 - 15.5 %   Platelets 161 150 - 400 K/uL   nRBC 0.0 0.0 - 0.2 %    Comment: Performed at [facility], [place] [address], [place], [place] [address]  Magnesium      Status: None   Collection Time: 12/22/21  9:10 AM  Result Value Ref Range   Magnesium 2.4 1.7 - 2.4 mg/dL    Comment: Performed at [facility] [address] [place], [place] [address]  Hepatitis B surface antigen     Status: None   Collection Time: 12/22/21  9:10 AM  Result Value Ref Range   Hepatitis B Surface Ag NON REACTIVE NON REACTIVE    Comment: Performed at [facility] [address] [place], [place] [address]  Hepatitis B surface antibody     Status: None   Collection Time: 12/22/21  9:10 AM  Result Value Ref Range   Hep B S Ab NON REACTIVE NON REACTIVE    Comment: (NOTE) Inconsistent with immunity, less than 10 mIU/mL.  Performed at [facility], [place] [address] [place], [place] [address]   Hepatitis B core antibody, total     Status: None   Collection Time: 12/22/21  9:10 AM  Result Value Ref Range   Hep B Core Total Ab NON REACTIVE NON REACTIVE    Comment: Performed at [facility] [address] [place], [place] [address]  Hepatitis C antibody     Status: None   Collection Time: 12/22/21  9:10 AM  Result Value Ref Range   HCV Ab NON REACTIVE NON REACTIVE    Comment: (NOTE) Nonreactive HCV  antibody screen is consistent with no HCV infections,  unless recent infection is suspected or other evidence exists to indicate HCV infection.  Performed at [facility], [place] [address] [place],  [address]    CT ANGIO GI BLEED  Result Date: 12/22/2021 CLINICAL DATA:  Lower GI bleed.  Abdominal pain and blood in stool. EXAM: CTA ABDOMEN AND PELVIS WITHOUT AND WITH CONTRAST TECHNIQUE: Multidetector CT imaging of the abdomen and pelvis was performed using the standard protocol during bolus administration of intravenous contrast. Multiplanar reconstructed images and MIPs were obtained and reviewed to evaluate the vascular anatomy. RADIATION DOSE REDUCTION: This exam was performed according to the departmental dose-optimization program  which includes automated exposure control, adjustment of the mA and/or kV according to patient size and/or use of iterative reconstruction technique. CONTRAST:  175m OMNIPAQUE IOHEXOL 350 MG/ML SOLN COMPARISON:  12/20/2021. FINDINGS: VASCULAR Aorta: Atherosclerotic calcification and mural thrombus of the aorta. Normal caliber aorta without aneurysm, vasculitis, or significant stenosis. There is a calcified dissection flap in the distal abdominal aorta. Celiac: Patent without evidence of aneurysm, dissection, vasculitis or significant stenosis. SMA: Patent without evidence of aneurysm, dissection, vasculitis or significant stenosis. Renals: Atherosclerotic calcification at the renal ostia bilaterally. There is moderate stenosis on the right and severe stenosis on the left. No dissection, vasculitis, or fibromuscular dysplasia. IMA: Patent without evidence of aneurysm, dissection, vasculitis or significant stenosis. Inflow: Atherosclerotic calcification and mural thrombus with no evidence of dissection. The common iliac and external iliac arteries demonstrate no significant stenosis, dissection, or aneurysm. There is high-grade stenosis with possible occlusion of the proximal internal iliac artery on the left with reconstitution. Proximal Outflow: Mild atherosclerotic calcification. Bilateral common femoral and visualized portions of the superficial and profunda femoral arteries are patent without evidence of aneurysm, dissection, vasculitis or significant stenosis. Veins: No obvious venous abnormality within the limitations of this arterial phase study. Review of the MIP images confirms the above findings. NON-VASCULAR Lower chest: Mild atelectasis or scarring is noted bilaterally. Pulmonary cysts are noted in the right lower lobe. Hepatobiliary: No focal liver abnormality. No biliary ductal dilatation. Stones are present within the gallbladder. Pancreas: Unremarkable. No pancreatic ductal dilatation or surrounding  inflammatory changes. Spleen: Hypodensity is noted in the spleen measuring 1.5 cm, statistically most likely representing a cyst or hemangioma. Adrenals/Urinary Tract: There is nodularity of the right adrenal gland measuring 1.1 cm with an area of enhancement, best seen on portal venous phase, axial images 02/30/2033. The left adrenal gland is within normal limits. A cyst is noted in the mid left kidney. No renal calculus or hydronephrosis. The bladder is nondistended. Stomach/Bowel: There is mild gastric wall thickening. No bowel obstruction, free air, or pneumatosis. The appendix is not visualized on exam. No focal bowel wall thickening or surrounding inflammatory changes. Evaluation of the bowel is limited due to underdistention. No focal hemorrhage or contrast extravasation is identified. Lymphatic: No abdominal or pelvic lymphadenopathy. Reproductive: Uterus and bilateral adnexa are unremarkable. Other: No abdominopelvic ascites. A fat containing inguinal hernia is noted. Musculoskeletal: No acute osseous abnormality. IMPRESSION: VASCULAR 1. No evidence of acute hemorrhage or contrast extravasation to suggest active hemorrhage. 2. Aortic atherosclerosis with no evidence of aneurysm. 3. Atherosclerotic calcification at the renal ostia bilaterally with moderate stenosis on the right and severe stenosis on the left. 4. High-grade stenosis/possible occlusion involving the proximal internal iliac artery on the left with reconstitution. NON-VASCULAR 1. No evidence of bowel obstruction, hemorrhage, or acute inflammatory changes. 2. Mild gastric  wall thickening, possible gastritis. 3. Cholelithiasis. 4. Right adrenal nodule measuring 1.1 cm with enhancement on portal venous phase. Dedicated CT or MRI imaging with adrenal protocol is recommended. Electronically Signed   By: Brett Fairy M.D.   On: 12/22/2021 00:57    Pending Labs Unresulted Labs (From admission, onward)     Start     Ordered   12/22/21 1115   Renal function panel  ONCE - STAT,   STAT        12/22/21 1118   12/22/21 0822  Hepatitis B surface antibody,quantitative  (New Admission Hemo Labs (Hepatitis B))  Once,   R        12/22/21 0823            Vitals/Pain Today's Vitals   12/22/21 1130 12/22/21 1200 12/22/21 1205 12/22/21 1351  BP: (!) 100/51 108/67 129/83 101/60  Pulse:      Resp: 15 (!) _0 Temp:    97.7 F (36.5 C)  TempSrc:    Oral  SpO2:    100%  Weight:      Height:      PainSc:    0-No pain    Isolation Precautions No active isolations  Medications Medications  acetaminophen (TYLENOL) tablet 650 mg (has no administration in time range)    Or  acetaminophen (TYLENOL) suppository 650 mg (has no administration in time range)  nicotine (NICODERM CQ - dosed in mg/24 hours) patch 14 mg (14 mg Transdermal Patch Applied 12/22/21 0945)  multivitamin (RENA-VIT) tablet 1 tablet (has no administration in time range)  hydroxychloroquine (PLAQUENIL) tablet 200 mg (200 mg Oral Given 12/22/21 1023)  sevelamer carbonate (RENVELA) tablet 2,400 mg (2,400 mg Oral Given 12/22/21 1233)  sevelamer carbonate (RENVELA) tablet 800 mg (has no administration in time range)  Chlorhexidine Gluconate Cloth 2 % PADS 6 each (6 each Topical Not Given 12/22/21 0844)  pentafluoroprop-tetrafluoroeth (GEBAUERS) aerosol 1 Application (has no administration in time range)  lidocaine (PF) (XYLOCAINE) 1 % injection 5 mL (has no administration in time range)  lidocaine-prilocaine (EMLA) cream 1 Application (has no administration in time range)  cinacalcet (SENSIPAR) tablet 60 mg (60 mg Oral Given 12/22/21 1232)  bisacodyl (DULCOLAX) EC tablet 10 mg (10 mg Oral Given 12/22/21 0945)  metoCLOPramide (REGLAN) injection 5 mg (has no administration in time range)  peg 3350 powder (MOVIPREP) kit 100 g (has no administration in time range)    And  peg 3350 powder (MOVIPREP) kit 100 g (has no administration in time range)  sodium chloride 0.9 %  bolus 250 mL (0 mLs Intravenous Stopped 12/21/21 2122)  ondansetron (ZOFRAN) injection 4 mg (4 mg Intravenous Given 12/21/21 2126)  iohexol (OMNIPAQUE) 350 MG/ML injection 100 mL (100 mLs Intravenous Contrast Given 12/22/21 0021)    Mobility  Low fall risk   Focused Assessments    R Recommendations: See Admitting Provider Note  Report given to:   Additional Notes:

## 2021-12-22 NOTE — ED Notes (Signed)
Pt transported to dialysis

## 2021-12-22 NOTE — Progress Notes (Signed)
Pt began bowel prep at 2000... pt informed of POC with bowel prep, procedure for am.. pt states, "she will not be able to drink all that" pt informed to do the best she can and educated on the need. All needs addressed at this time

## 2021-12-22 NOTE — H&P (Addendum)
Hospital Admission History and Physical Service Pager: [phone]  Patient name: Kathleen [name] Medical record number: [id number] Date of Birth: June 24, 1960 Age: 61 y.o. Gender: female  Primary Care Provider: Angela Burke, NP Consultants: GI Code Status: FULL Preferred Emergency Contact: boyfriend Grover Canavan Information     Name Relation Home Work [place] Significant other   [phone]   [place] Brother   [phone]        Chief Complaint: Hematochezia  Assessment and Plan: Kathleen [name] is a 61 y.o. female presenting with hematochezia and periumbilical pain for 3 days.  Not on blood thinners.  Due to bright red blood and visualized hemorrhoids, this is likely a lower GI bleed, suspect hemorrhoidal based on exam. Differential for presentation of this includes hemorrhoids, anal fissure, mass.  Diverticular disease possible but patient does not have pain in the lower abdomen and does not have signs of infection.   * GI bleed Likely lower GI bleed (see above).  Hemoglobin of 11.9 on recheck. Mildly hypotensive now improved with fluids. - Admit to FMTS, attending Dr. Thompson Grayer - GI consult, appreciate recs - Monitor hemoglobin - s/p 250 cc bolus in the ED, caution with fluid replacement, ESRD on HD - CT angiogram did not identify source of lower GI bleed at this time - N.p.o. sips with medicine for possible colonoscopy  ESRD (end stage renal disease) ([place]) ESRD on HD T/TH/SAT.  Received her HD on Saturday. - Consult nephrology a.m. - Continue home phosphate binder Renvela 800 mg once per day  Hypertension Patient on 3 medication regimen: Amlodipine, carvedilol, hydralazine.  Blood pressure of 94/45 on admission, possible overly treated vs related to GI bleed.  Decision was made to hold these medications. - Caution with fluid resuscitation, ESRD on HD - Consider restarting home medications after hypotension improved  Adrenal nodule  (HCC) 1.1 cm right adrenal nodule incidental finding on CT imaging - recommended dedicated CT or MRI adrenal protocol (can likely be pursued outpatient)  Prolonged QT interval QTc 540. On hydroxychloroquine. - repeat AM EKG - check Mg - cautious with QT-prolonging agents   Other problems chronic and stable: - Lupus: Continue home hydroxychloroquine 200 mg tablet per day   FEN/GI: NPO sips with medicine VTE Prophylaxis: None  Disposition:   History of Present Illness:  Kathleen [name] is a 61 y.o. female presenting with rectal bleeding.  Reports she has had blood in her stool for 3 days. She has had some periumbilical abdominal discomfort, fatigue and chills. Denies fever. Last time she took ibuprofen was about 3 weeks ago. Not on any blood thinners.  Last dialysis was Saturday. Reports chronic numbness in her legs, unsure when it started.  In the ED, patient continued to have 5 bloody stools. Fecal occult blood test positive.  Found to have thrombosed hemorrhoids on rectal exam.  Patient was hypotenstive in the 80s/60s but improved to 111/91 after 250 mL bolus of fluids.  Hemoglobin 13.2.  CT angiogram did not identify source of lower GI bleed at this time.  GI was consulted and will see patient today.  FMTS was consulted for admission.  Review Of Systems: Per HPI with the following additions:  Review of Systems  Constitutional:  Positive for chills and malaise/fatigue. Negative for fever.  Respiratory:  Negative for cough and shortness of breath.   Cardiovascular:  Negative for chest pain.  Gastrointestinal:  Positive for abdominal pain and blood in stool.  Pertinent Past Medical History: -ESRD on HD T/TH/SAT - HTN - Lupus  Remainder reviewed in history tab.    Pertinent Social History: Tobacco use: Yes/No/Former, 0.33 pack/day - would like nicotine patch Alcohol use: denies Other Substance use: occasional THC Homeless, lives in her car  Remainder reviewed in  history tab.   Important Outpatient Medications: -Hydroxychloroquine 200 mg tablet daily for lupus - Amlodipine 10 mg daily - Carvedilol 3.125 mg tablet twice daily - Hydralazine 50 mg tablet every 12 hours -Renvela 800 mg tablet daily  Remainder reviewed in medication history.   Objective: BP 96/63   Pulse 75   Temp 98.6 F (37 C)   Resp 16   Ht '5\' 4"'$  (1.626 m)   Wt 50.5 kg   SpO2 99%   BMI 19.11 kg/m  Exam: General: Tired but arousable, pleasantly conversing, not in acute distress Cardiovascular: RRR, no MRG Respiratory: CTAB Gastrointestinal: Bowel sounds present, no rebound tenderness.  Tenderness to palpation in periumbilical region. Neuro: Alert and oriented to person and place   Labs:  CBC BMET  Recent Labs  Lab 12/22/21 0319  WBC 7.4  HGB 11.9*  HCT 37.1  PLT 161   Recent Labs  Lab 12/21/21 1834  NA 136  K 3.7  CL 94*  CO2 21*  BUN 44*  CREATININE 11.08*  GLUCOSE 90  CALCIUM 9.6    Pertinent additional labs: - Positive fecal occult blood    Imaging Studies Performed:  CT angio GI bleed Impression from Radiologist:  VASCULAR   1. No evidence of acute hemorrhage or contrast extravasation to suggest active hemorrhage. 2. Aortic atherosclerosis with no evidence of aneurysm. 3. Atherosclerotic calcification at the renal ostia bilaterally with moderate stenosis on the right and severe stenosis on the left. 4. High-grade stenosis/possible occlusion involving the proximal internal iliac artery on the left with reconstitution.   NON-VASCULAR   1. No evidence of bowel obstruction, hemorrhage, or acute inflammatory changes. 2. Mild gastric wall thickening, possible gastritis. 3. Cholelithiasis. 4. Right adrenal nodule measuring 1.1 cm with enhancement on portal venous phase. Dedicated CT or MRI imaging with adrenal protocol is recommended.  My Interpretation: No evidence of acute hemorrhage, I agree with atherosclerosis of aorta and renal  ostia.    Leslie Dales, MD 12/22/2021, 3:38 AM PGY-1, [place] Intern pager: [phone], text pages welcome Secure chat group [place]    I was personally present and performed or re-performed the history, physical exam and medical decision making activities of this service and have verified that the service and findings are accurately documented in the resident's note.  Zola Button, MD                  12/22/2021, 4:01 AM

## 2021-12-22 NOTE — Progress Notes (Signed)
Fluid removal turned off at approx 1530 due to BP being 88/ 53. No signs or symptoms of distress noted. Patient BP went up after that and remained stable. BP currently 116/64. Fluid removal turned on. Patient remains stable and is resting on right side with eyes closed. Breathing even and unlabored.

## 2021-12-22 NOTE — Progress Notes (Signed)
     Daily Progress Note Intern Pager: [phone]  Patient name: Kathleen [name] Medical record number: [id number] Date of birth: 03-04-1961 Age: 61 y.o. Gender: female  Primary Care Provider: Angela Burke, NP Consultants: GI, Nephrology Code Status: FULL  Pt Overview and Major Events to Date:  -8/15 Admitted  Assessment and Plan:  Kathleen [name] is a 61 y.o. female presenting with hematochezia and admitted for GI bleed work-up. Pertinent PMH/PSH includes ESRD TTS, SLE, HTN, COPD.   * GI bleed Likely lower GI bleed.  Hemoglobin stable at baseline. Patient asymptomatic - GI consulted, appreciate recs - Monitor hemoglobin - NPO currently  ESRD (end stage renal disease) ([place]) Dialysis TThSa - Nephrology consulted, recs appreciated - Continue home phosphate binder Renvela 800 mg once per day  Hypertension Blood pressure soft this AM, patient asymptomatic. Hold home amlodipine, Carvedilol, and Hydralazine - Add back antihypertensives as appropriate  Adrenal nodule ([place]) Likely incidental finding on CTA. - recommended dedicated CT or MRI adrenal protocol, consider doing inpatient  Prolonged QT interval QTC ** On hydroxychloroquine. Magnesium WNL. - repeat AM EKG - cautious with QT-prolonging agents       FEN/GI: NPO PPx: bleed risk, not on prophylaxis Dispo:Pending clinical improvement  Subjective:  Patient reports last blood bowel movement was 20 minutes ago. GI doctors previously in and patient reports they are considering doing upper and lower endoscopy. Patient reports stomach pain is periumbilical and began a few days prior to having blood bowel movements. Reports cramping in left lower quadrant. Reports no vomiting since last seen. Complains of bilateral leg and back pain that is chronic.   Objective: Temp:  [97.6 F (36.4 C)-98.6 F (37 C)] 97.6 F (36.4 C) (08/15 1124) Pulse Rate:  [72-86] 78 (08/15 1124) Resp:  [11-18] 15 (08/15 1130) BP:  (85-111)/(45-92) 100/51 (08/15 1130) SpO2:  [98 %-100 %] 100 % (08/15 1011) Weight:  [50.5 kg] 50.5 kg (08/14 1802) Physical Exam: General: NAD, lying comfortably in hospital bed Cardiovascular: RRR, no murmurs, no peripheral edema Respiratory: normal WOB on RA, CTAB, no wheezes, ronchi or rales Abdomen: soft, tender to deep palpation at umbilicus, no rebound or guarding Extremities: Moving all 4 extremities equally   Laboratory: Most recent CBC Lab Results  Component Value Date   WBC 7.4 12/22/2021   HGB 11.9 (L) 12/22/2021   HCT 37.1 12/22/2021   MCV 89.8 12/22/2021   PLT 161 12/22/2021   Most recent BMP    Latest Ref Rng & Units 12/21/2021    6:34 PM  BMP  Glucose 70 - 99 mg/dL 90   BUN 8 - 23 mg/dL 44   Creatinine 0.44 - 1.00 mg/dL 11.08   Sodium 135 - 145 mmol/L 136   Potassium 3.5 - 5.1 mmol/L 3.7   Chloride 98 - 111 mmol/L 94   CO2 22 - 32 mmol/L 21   Calcium 8.9 - 10.3 mg/dL 9.6    Magnesium - pending Repeat EKG - self calculated Qtc 503   Imaging/Diagnostic Tests: No new imaging.  Salvadore Oxford, MD 12/22/2021, 11:39 AM  PGY-1, [place] Intern pager: [phone], text pages welcome Secure chat group [place]

## 2021-12-22 NOTE — Evaluation (Signed)
Physical Therapy Evaluation Patient Details Name: Kathleen [name] MRN: [id number] DOB: [birth year] Today's Date: 12/22/2021  History of Present Illness  Pt is a 61 y/o female admitted secondary to hypotension and blood in stool. PMH includes ESRD on HD, HTN, and lupus.  Clinical Impression  Pt admitted secondary to problem above with deficits below. Pt requiring min guard to stand and take side steps. Reports numbness in legs upon standing and states she is unable to tolerate further. Feel pt would benefit from use of rollator at d/c. Reports she has been staying in her car and would like resources for shelters; notified CSW in ED. Anticipate pt will progress well once symptoms improve. Will continue to follow acutely.        Recommendations for follow up therapy are one component of a multi-disciplinary discharge planning process, led by the attending physician.  Recommendations may be updated based on patient status, additional functional criteria and insurance authorization.  Follow Up Recommendations Outpatient PT      Assistance Recommended at Discharge Intermittent Supervision/Assistance  Patient can return home with the following  Assistance with cooking/housework;A little help with walking and/or transfers;A little help with bathing/dressing/bathroom    Equipment Recommendations Rollator (4 wheels)  Recommendations for Other Services       Functional Status Assessment Patient has had a recent decline in their functional status and demonstrates the ability to make significant improvements in function in a reasonable and predictable amount of time.     Precautions / Restrictions Precautions Precautions: Fall Restrictions Weight Bearing Restrictions: No      Mobility  Bed Mobility Overal bed mobility: Needs Assistance Bed Mobility: Supine to Sit, Sit to Supine     Supine to sit: Min assist Sit to supine: Min guard   General bed mobility comments: Min A for trunk  elevation    Transfers Overall transfer level: Needs assistance Equipment used: 1 person hand held assist Transfers: Sit to/from Stand Sit to Stand: Min guard           General transfer comment: Min guard for safety. Pt reports numbness in legs, but able to take side steps at EOB.    Ambulation/Gait                  Stairs            Wheelchair Mobility    Modified Rankin (Stroke Patients Only)       Balance Overall balance assessment: Needs assistance Sitting-balance support: No upper extremity supported, Feet supported Sitting balance-Leahy Scale: Good     Standing balance support: No upper extremity supported, Single extremity supported Standing balance-Leahy Scale: Fair                               Pertinent Vitals/Pain Pain Assessment Pain Assessment: Faces Faces Pain Scale: Hurts little more Pain Location: generalized Pain Descriptors / Indicators: Grimacing, Guarding Pain Intervention(s): Limited activity within patient's tolerance, Monitored during session, Repositioned    Home Living Family/patient expects to be discharged to:: Shelter/Homeless                   Additional Comments: Has been sleeping in her car as she has been unable to stay in motel due to cost.    Prior Function Prior Level of Function : Independent/Modified Independent;Driving  Hand Dominance        Extremity/Trunk Assessment   Upper Extremity Assessment Upper Extremity Assessment: Defer to OT evaluation    Lower Extremity Assessment Lower Extremity Assessment: Generalized weakness (reports numbness in legs when she stands)    Cervical / Trunk Assessment Cervical / Trunk Assessment: Normal  Communication   Communication: No difficulties  Cognition Arousal/Alertness: Awake/alert Behavior During Therapy: WFL for tasks assessed/performed Overall Cognitive Status: No family/caregiver present to determine  baseline cognitive functioning                                          General Comments      Exercises     Assessment/Plan    PT Assessment Patient needs continued PT services  PT Problem List Decreased strength;Decreased activity tolerance;Decreased balance;Decreased mobility;Decreased knowledge of use of DME;Decreased knowledge of precautions       PT Treatment Interventions DME instruction;Gait training;Functional mobility training;Therapeutic activities;Therapeutic exercise;Balance training;Patient/family education    PT Goals (Current goals can be found in the Care Plan section)  Acute Rehab PT Goals Patient Stated Goal: to find a place to stay PT Goal Formulation: With patient Time For Goal Achievement: 01/05/22 Potential to Achieve Goals: Good    Frequency Min 3X/week     Co-evaluation               AM-PAC PT "6 Clicks" Mobility  Outcome Measure Help needed turning from your back to your side while in a flat bed without using bedrails?: None Help needed moving from lying on your back to sitting on the side of a flat bed without using bedrails?: A Little Help needed moving to and from a bed to a chair (including a wheelchair)?: A Little Help needed standing up from a chair using your arms (e.g., wheelchair or bedside chair)?: A Little Help needed to walk in hospital room?: A Little Help needed climbing 3-5 steps with a railing? : A Lot 6 Click Score: 18    End of Session   Activity Tolerance: Patient limited by fatigue Patient left: in bed;with call bell/phone within reach (on stretcher in ED) Nurse Communication: Mobility status PT Visit Diagnosis: Unsteadiness on feet (R26.81);Muscle weakness (generalized) (M62.81)    Time: 7672-0947 PT Time Calculation (min) (ACUTE ONLY): 12 min   Charges:   PT Evaluation $PT Eval Moderate Complexity: 1 Mod          Kathleen [name], PT, DPT  Acute Rehabilitation Services  Office: [phone]   Kathleen [name] 12/22/2021, 1:48 PM

## 2021-12-22 NOTE — Progress Notes (Signed)
18:50- patient arrived onto the unit from dialysis. A&O x4. VSS. Denies pain at this time. Updated on plan of care. Colonoscopy consent singed and placed on chart. All questions/concerns addressed.

## 2021-12-22 NOTE — Assessment & Plan Note (Addendum)
QTC stable from last EKG. On hydroxychloroquine. Magnesium WNL. - cautious with QT-prolonging agents

## 2021-12-22 NOTE — Assessment & Plan Note (Addendum)
S/p EGD and colonoscopy.  EGD: Showed multiple nonbleeding gastric and duodenal ulcers, H. Pylori negative on biopsy.  Colonoscopy: 1 polyp biopsy, no source of bleeding identified.  Hemoglobin stable at baseline. Patient asymptomatic - Per GI, start 40 mg pantoprazole twice daily for 8 weeks, then reduce to 40 mg daily. - weight has remained stable since 09/2021, family reports 12 lb weight loss over the past month.  - GI consulted, appreciate recs - Full diet - Discontinue daily CBC and CMP

## 2021-12-23 ENCOUNTER — Observation Stay (HOSPITAL_COMMUNITY): Payer: Medicare Other | Admitting: Anesthesiology

## 2021-12-23 ENCOUNTER — Encounter (HOSPITAL_COMMUNITY)
Admission: EM | Disposition: A | Discharge status: Home / Self Care | Payer: Self-pay | Source: Home / Self Care | Attending: Family Medicine

## 2021-12-23 ENCOUNTER — Observation Stay (HOSPITAL_BASED_OUTPATIENT_CLINIC_OR_DEPARTMENT_OTHER): Payer: Medicare Other | Admitting: Anesthesiology

## 2021-12-23 ENCOUNTER — Encounter (HOSPITAL_COMMUNITY): Payer: Self-pay | Admitting: Student

## 2021-12-23 DIAGNOSIS — K621 Rectal polyp: Secondary | ICD-10-CM | POA: Diagnosis not present

## 2021-12-23 DIAGNOSIS — K529 Noninfective gastroenteritis and colitis, unspecified: Secondary | ICD-10-CM

## 2021-12-23 DIAGNOSIS — K250 Acute gastric ulcer with hemorrhage: Secondary | ICD-10-CM | POA: Diagnosis not present

## 2021-12-23 DIAGNOSIS — K6389 Other specified diseases of intestine: Secondary | ICD-10-CM | POA: Diagnosis not present

## 2021-12-23 DIAGNOSIS — K259 Gastric ulcer, unspecified as acute or chronic, without hemorrhage or perforation: Secondary | ICD-10-CM

## 2021-12-23 DIAGNOSIS — M1990 Unspecified osteoarthritis, unspecified site: Secondary | ICD-10-CM

## 2021-12-23 DIAGNOSIS — K6289 Other specified diseases of anus and rectum: Secondary | ICD-10-CM | POA: Diagnosis not present

## 2021-12-23 DIAGNOSIS — K269 Duodenal ulcer, unspecified as acute or chronic, without hemorrhage or perforation: Secondary | ICD-10-CM

## 2021-12-23 DIAGNOSIS — R9431 Abnormal electrocardiogram [ECG] [EKG]: Secondary | ICD-10-CM

## 2021-12-23 DIAGNOSIS — I10 Essential (primary) hypertension: Secondary | ICD-10-CM

## 2021-12-23 DIAGNOSIS — N186 End stage renal disease: Secondary | ICD-10-CM

## 2021-12-23 DIAGNOSIS — K921 Melena: Secondary | ICD-10-CM

## 2021-12-23 DIAGNOSIS — D125 Benign neoplasm of sigmoid colon: Secondary | ICD-10-CM

## 2021-12-23 DIAGNOSIS — E278 Other specified disorders of adrenal gland: Secondary | ICD-10-CM | POA: Diagnosis not present

## 2021-12-23 DIAGNOSIS — K279 Peptic ulcer, site unspecified, unspecified as acute or chronic, without hemorrhage or perforation: Secondary | ICD-10-CM | POA: Diagnosis not present

## 2021-12-23 HISTORY — PX: COLONOSCOPY WITH PROPOFOL: SHX5780

## 2021-12-23 HISTORY — PX: ESOPHAGOGASTRODUODENOSCOPY: SHX5428

## 2021-12-23 HISTORY — PX: POLYPECTOMY: SHX5525

## 2021-12-23 HISTORY — PX: BIOPSY: SHX5522

## 2021-12-23 LAB — CBC
HCT: 39.3 % (ref 36.0–46.0)
Hemoglobin: 12.3 g/dL (ref 12.0–15.0)
MCH: 28.5 pg (ref 26.0–34.0)
MCHC: 31.3 g/dL (ref 30.0–36.0)
MCV: 91 fL (ref 80.0–100.0)
Platelets: 194 10*3/uL (ref 150–400)
RBC: 4.32 MIL/uL (ref 3.87–5.11)
RDW: 18.2 % — ABNORMAL HIGH (ref 11.5–15.5)
WBC: 5.7 10*3/uL (ref 4.0–10.5)
nRBC: 0 % (ref 0.0–0.2)

## 2021-12-23 LAB — BASIC METABOLIC PANEL
Anion gap: 15 (ref 5–15)
BUN: 26 mg/dL — ABNORMAL HIGH (ref 8–23)
CO2: 21 mmol/L — ABNORMAL LOW (ref 22–32)
Calcium: 9.3 mg/dL (ref 8.9–10.3)
Chloride: 102 mmol/L (ref 98–111)
Creatinine, Ser: 8.85 mg/dL — ABNORMAL HIGH (ref 0.44–1.00)
GFR, Estimated: 5 mL/min — ABNORMAL LOW (ref >60)
Glucose, Bld: 87 mg/dL (ref 70–99)
Potassium: 3.7 mmol/L (ref 3.5–5.1)
Sodium: 138 mmol/L (ref 135–145)

## 2021-12-23 LAB — HEPATITIS B SURFACE ANTIBODY, QUANTITATIVE: Hep B S AB Quant (Post): <3.1 m[IU]/mL — ABNORMAL LOW (ref >9.9)

## 2021-12-23 SURGERY — COLONOSCOPY WITH PROPOFOL
Anesthesia: Monitor Anesthesia Care

## 2021-12-23 MED ORDER — PROPOFOL 500 MG/50ML IV EMUL
INTRAVENOUS | Status: DC | PRN
  Administered 2021-12-23: 150 ug/kg/min via INTRAVENOUS

## 2021-12-23 MED ORDER — POTASSIUM CHLORIDE CRYS ER 20 MEQ PO TBCR: 40.0000 meq | EXTENDED_RELEASE_TABLET | Freq: Two times a day (BID) | ORAL | Status: DC

## 2021-12-23 MED ORDER — ONDANSETRON HCL 4 MG/2ML IJ SOLN
INTRAMUSCULAR | Status: DC | PRN
  Administered 2021-12-23: 4 mg via INTRAVENOUS

## 2021-12-23 MED ORDER — SODIUM CHLORIDE 0.9 % IV SOLN
INTRAVENOUS | Status: AC | PRN
  Administered 2021-12-23: 500 mL via INTRAMUSCULAR

## 2021-12-23 MED ORDER — LIDOCAINE 2% (20 MG/ML) 5 ML SYRINGE
INTRAMUSCULAR | Status: DC | PRN
  Administered 2021-12-23: 60 mg via INTRAVENOUS

## 2021-12-23 MED ORDER — PANTOPRAZOLE SODIUM 40 MG PO TBEC
40.0000 mg | DELAYED_RELEASE_TABLET | Freq: Two times a day (BID) | ORAL | Status: DC
Start: 2021-12-23 — End: 2021-12-25
  Administered 2021-12-23 – 2021-12-25 (×5): 40 mg via ORAL
  Filled 2021-12-23 (×5): qty 1

## 2021-12-23 MED ORDER — LACTATED RINGERS IV SOLN: INTRAVENOUS | Status: DC | PRN

## 2021-12-23 MED ORDER — PROPOFOL 10 MG/ML IV BOLUS
INTRAVENOUS | Status: DC | PRN
  Administered 2021-12-23: 30 mg via INTRAVENOUS

## 2021-12-23 MED ORDER — ACETAMINOPHEN 500 MG PO TABS
1000.0000 mg | ORAL_TABLET | Freq: Two times a day (BID) | ORAL | Status: DC
  Administered 2021-12-23 – 2021-12-25 (×4): 1000 mg via ORAL
  Filled 2021-12-23 (×4): qty 2

## 2021-12-23 SURGICAL SUPPLY — 22 items

## 2021-12-23 NOTE — Progress Notes (Signed)
[facility] Progress Note    Assessment/ Plan:   # ESRD:  -outpatient HD orders: [name]. 3hrs 68mn. F160. Flow rates: 350/autoflow 1.5. 2k/2cal. UF profile 2. AVG. Meds: sensipar '60mg'$  qtreatment. Heparin 3600 units bolus. Mircera 72m q2weeks (supposed to start 8/15) -HD on TTS schedule, no heparin -renally dose meds   # Hypotension Volume/ h/o hypertension: -low BP likely related to bleed + vomiting/poor po intake. Home anti-HTN on hold, BP on the softer side but stable, relatively euvolemic on exam -will need to readjust EDW post d/c (EDW was being lowered as an outpatient)   # BRBPR, Anemia of Chronic Kidney Disease: Hemoglobin 11.9, has not received esa's yet as an outpatient. Hgb currently at goal, will hold off on starting ESAs for now. -transfuse prn for hgb < 7 -GI consulted, EGD/scope today   # Secondary Hyperparathyroidism/Hyperphosphatemia: Renvela and sensipar resumed    Subjective:   Patient seen and examined in room. No acute events, patient reports that she tolerated HD yesterday, no UF. Open for EGD/Cscope today   Objective:   BP (!) 93/53 (BP Location: Right Leg)   Pulse 89   Temp 98.5 F (36.9 C) (Oral)   Resp 18   Ht '5\' 4"'$  (1.626 m)   Wt 45.4 kg   SpO2 99%   BMI 17.18 kg/m   Intake/Output Summary (Last 24 hours) at 12/23/2021 [id number] data filed at 12/22/2021 1700 Gross per 24 hour  Intake --  Output 0 ml  Net 0 ml   Weight change: -5.1 kg  Physical Exam: Gen:NAD CVS:RRR Resp:normal WOB AbTUU:EKCMnt/ND Ext:no sig edema Neuro: awake, alert Dialysis access: LUE AVG +b/t  Imaging: CT ANGIO GI BLEED  Result Date: 12/22/2021 CLINICAL DATA:  Lower GI bleed.  Abdominal pain and blood in stool. EXAM: CTA ABDOMEN AND PELVIS WITHOUT AND WITH CONTRAST TECHNIQUE: Multidetector CT imaging of the abdomen and pelvis was performed using the standard protocol during bolus administration of intravenous contrast. Multiplanar reconstructed  images and MIPs were obtained and reviewed to evaluate the vascular anatomy. RADIATION DOSE REDUCTION: This exam was performed according to the departmental dose-optimization program which includes automated exposure control, adjustment of the mA and/or kV according to patient size and/or use of iterative reconstruction technique. CONTRAST:  10078mMNIPAQUE IOHEXOL 350 MG/ML SOLN COMPARISON:  12/20/2021. FINDINGS: VASCULAR Aorta: Atherosclerotic calcification and mural thrombus of the aorta. Normal caliber aorta without aneurysm, vasculitis, or significant stenosis. There is a calcified dissection flap in the distal abdominal aorta. Celiac: Patent without evidence of aneurysm, dissection, vasculitis or significant stenosis. SMA: Patent without evidence of aneurysm, dissection, vasculitis or significant stenosis. Renals: Atherosclerotic calcification at the renal ostia bilaterally. There is moderate stenosis on the right and severe stenosis on the left. No dissection, vasculitis, or fibromuscular dysplasia. IMA: Patent without evidence of aneurysm, dissection, vasculitis or significant stenosis. Inflow: Atherosclerotic calcification and mural thrombus with no evidence of dissection. The common iliac and external iliac arteries demonstrate no significant stenosis, dissection, or aneurysm. There is high-grade stenosis with possible occlusion of the proximal internal iliac artery on the left with reconstitution. Proximal Outflow: Mild atherosclerotic calcification. Bilateral common femoral and visualized portions of the superficial and profunda femoral arteries are patent without evidence of aneurysm, dissection, vasculitis or significant stenosis. Veins: No obvious venous abnormality within the limitations of this arterial phase study. Review of the MIP images confirms the above findings. NON-VASCULAR Lower chest: Mild atelectasis or scarring is noted bilaterally. Pulmonary cysts are noted in the  right lower lobe.  Hepatobiliary: No focal liver abnormality. No biliary ductal dilatation. Stones are present within the gallbladder. Pancreas: Unremarkable. No pancreatic ductal dilatation or surrounding inflammatory changes. Spleen: Hypodensity is noted in the spleen measuring 1.5 cm, statistically most likely representing a cyst or hemangioma. Adrenals/Urinary Tract: There is nodularity of the right adrenal gland measuring 1.1 cm with an area of enhancement, best seen on portal venous phase, axial images 02/30/2033. The left adrenal gland is within normal limits. A cyst is noted in the mid left kidney. No renal calculus or hydronephrosis. The bladder is nondistended. Stomach/Bowel: There is mild gastric wall thickening. No bowel obstruction, free air, or pneumatosis. The appendix is not visualized on exam. No focal bowel wall thickening or surrounding inflammatory changes. Evaluation of the bowel is limited due to underdistention. No focal hemorrhage or contrast extravasation is identified. Lymphatic: No abdominal or pelvic lymphadenopathy. Reproductive: Uterus and bilateral adnexa are unremarkable. Other: No abdominopelvic ascites. A fat containing inguinal hernia is noted. Musculoskeletal: No acute osseous abnormality. IMPRESSION: VASCULAR 1. No evidence of acute hemorrhage or contrast extravasation to suggest active hemorrhage. 2. Aortic atherosclerosis with no evidence of aneurysm. 3. Atherosclerotic calcification at the renal ostia bilaterally with moderate stenosis on the right and severe stenosis on the left. 4. High-grade stenosis/possible occlusion involving the proximal internal iliac artery on the left with reconstitution. NON-VASCULAR 1. No evidence of bowel obstruction, hemorrhage, or acute inflammatory changes. 2. Mild gastric wall thickening, possible gastritis. 3. Cholelithiasis. 4. Right adrenal nodule measuring 1.1 cm with enhancement on portal venous phase. Dedicated CT or MRI imaging with adrenal protocol is  recommended. Electronically Signed   By: Brett Fairy M.D.   On: 12/22/2021 00:57    Labs: BMET Recent Labs  Lab 12/19/21 2218 12/21/21 1834 12/22/21 1430  NA 135 136 132*  K 3.9 3.7 3.2*  CL 91* 94* 89*  CO2 30 21* 23  GLUCOSE 111* 90 139*  BUN 13 44* 55*  CREATININE 6.06* 11.08* 12.81*  CALCIUM 9.4 9.6 8.9  PHOS  --   --  8.2*   CBC Recent Labs  Lab 12/19/21 2218 12/21/21 1834 12/22/21 0319  WBC 8.6 8.3 7.4  NEUTROABS  --  5.4  --   HGB 13.2 13.2 11.9*  HCT 42.1 42.5 37.1  MCV 91.3 92.0 89.8  PLT 198 166 161    Medications:     Chlorhexidine Gluconate Cloth  6 each Topical Q0600   cinacalcet  60 mg Oral Q T,Th,Sa-HD   hydroxychloroquine  200 mg Oral Daily   multivitamin  1 tablet Oral QHS   nicotine  14 mg Transdermal Daily   potassium chloride  40 mEq Oral BID   sevelamer carbonate  2,400 mg Oral TID WC      Gean Quint, MD [facility] 12/23/2021, 8:24 AM

## 2021-12-23 NOTE — Op Note (Signed)
[facility] Patient Name: Kathleen [name] Procedure Date : 12/23/2021 MRN: [id number] Attending MD: Thornton Park MD, MD Date of Birth: May 29, 1960 CSN: [id number] Age: 61 Admit Type: Inpatient Procedure:                Upper GI endoscopy Indications:              Hematochezia, Abnormal CT of the GI tract Providers:                Thornton Park MD, MD, Allayne Gitelman, RN, Benetta Spar, Technician Referring MD:              Medicines:                Monitored Anesthesia Care Complications:            No immediate complications. Estimated Blood Loss:     Estimated blood loss was minimal. Procedure:                Pre-Anesthesia Assessment:                           - Prior to the procedure, a History and Physical                            was performed, and patient medications and                            allergies were reviewed. The patient's tolerance of                            previous anesthesia was also reviewed. The risks                            and benefits of the procedure and the sedation                            options and risks were discussed with the patient.                            All questions were answered, and informed consent                            was obtained. Prior Anticoagulants: The patient has                            taken no previous anticoagulant or antiplatelet                            agents. ASA Grade Assessment: III - A patient with                            severe systemic disease. After reviewing the risks  and benefits, the patient was deemed in                            satisfactory condition to undergo the procedure.                           After obtaining informed consent, the endoscope was                            passed under direct vision. Throughout the                            procedure, the patient's blood pressure, pulse, and                             oxygen saturations were monitored continuously. The                            [id number]-[id number] ([id number]) Olympus endoscope was introduced                            through the mouth, and advanced to the third part                            of duodenum. The upper GI endoscopy was                            accomplished without difficulty. The patient                            tolerated the procedure well. Scope In: Scope Out: Findings:      The esophagus was normal.      A slightly linear and serpiginous non-bleeding cratered gastric ulcer       versus two confluent ulcers were found in the gastric antrum. The       largest lesion was 10 mm in largest dimension. Biopsies were taken from       the antrum, body, and fundus with a cold forceps for histology.       Estimated blood loss was minimal.      Multiple small non-bleeding superficial duodenal ulcers were found in       the duodenal bulb. A 4-48m ulcer was also present on a more pominent       fold in the duodenal bulb. Biopsies were taken with a cold forceps for       histology. Estimated blood loss was minimal.      The cardia and gastric fundus were normal on retroflexion. Impression:               - Normal esophagus.                           - Non-bleeding gastric ulcers with no stigmata of                            bleeding. Biopsied.                           -  Non-bleeding duodenal ulcers. Biopsied. Recommendation:           - Return patient to hospital ward for ongoing care.                           - Advance diet as tolerated.                           - Continue present medications.                           - Start pantoprazole 40 mg BID for at least 8                            weeks, then reduce to 40 mg QAM.                           - No aspirin, ibuprofen, naproxen, or other                            non-steroidal anti-inflammatory drugs.                           - Await pathology results.                            - Repeat EGD in 8-10 weeks to document healing. Procedure Code(s):        --- Professional ---                           [id number], Esophagogastroduodenoscopy, flexible,                            transoral; with biopsy, single or multiple Diagnosis Code(s):        --- Professional ---                           K25.9, Gastric ulcer, unspecified as acute or                            chronic, without hemorrhage or perforation                           K26.9, Duodenal ulcer, unspecified as acute or                            chronic, without hemorrhage or perforation                           K92.1, Melena (includes Hematochezia)                           R93.3, Abnormal findings on diagnostic imaging of                            other parts of digestive tract CPT copyright 2019 American Medical  Association. All rights reserved. The codes documented in this report are preliminary and upon coder review may  be revised to meet current compliance requirements. Thornton Park MD, MD 12/23/2021 1:38:46 PM This report has been signed electronically. Number of Addenda: 0

## 2021-12-23 NOTE — TOC Initial Note (Signed)
Transition of Care [facility]) - Initial/Assessment Note    Patient Details  Name: Kathleen [name] MRN: [id number] Date of Birth: [birth year]  Transition [facility] CM/SW Contact:    Curlene Labrum, RN Phone Number: 12/23/2021, 3:18 PM  Clinical Narrative:                 CM met with the patient and significant other at the bedside.  The patient is currently homeless and living in her car since she is no longer able to stay with her brother and other family members.  I spoke with the patient and gave her the Medicare Observation letter and also provided her with [facility] shelter list.  The patient receives a Disability check of 734.00 per month and plans to follow up with DSS for Food stamps renewal.  The patient plans to discharge back to her car and follow up with the [facility] for permanent housing resources.  CM will continue to follow the patient for discharge needs.  Expected Discharge Plan: Homeless Shelter Barriers to Discharge: Homeless with medical needs   Patient Goals and CMS Choice Patient states their goals for this hospitalization and ongoing recovery are:: To go to the [facility] to receive homeless resources CMS Medicare.gov Compare Post Acute Care list provided to:: Patient Choice offered to / list presented to : Patient  Expected Discharge Plan and Services Expected Discharge Plan: Homeless Shelter   Discharge Planning Services: CM Consult   Living arrangements for the past 2 months: Homeless (Patient lives in her car with her friend)                                      Prior Living Arrangements/Services Living arrangements for the past 2 months: Homeless (Patient lives in her car with her friend) Lives with:: Roommate   Do you feel safe going back to the place where you live?: Yes      Need for Family Participation in Patient Care: Yes (Comment) Care giver support system in place?: Yes (comment)   Criminal Activity/Legal  Involvement Pertinent to Current Situation/Hospitalization: No - Comment as needed  Activities of Daily Living Home Assistive Devices/Equipment: None ADL Screening (condition at time of admission) Patient's cognitive ability adequate to safely complete daily activities?: Yes Is the patient deaf or have difficulty hearing?: No Does the patient have difficulty seeing, even when wearing glasses/contacts?: No Does the patient have difficulty concentrating, remembering, or making decisions?: No Patient able to express need for assistance with ADLs?: No Does the patient have difficulty dressing or bathing?: No Independently performs ADLs?: Yes (appropriate for developmental age) Does the patient have difficulty walking or climbing stairs?: No Weakness of Legs: None Weakness of Arms/Hands: None  Permission Sought/Granted Permission sought to share information with : Case Manager Permission granted to share information with : Yes, Verbal Permission Granted     Permission granted to share info w AGENCY: [facility] to assist with resources to find permanent housing - Patient given shelter list  Permission granted to share info w Relationship: Jackelyn Hoehn - significant other - [phone]     Emotional Assessment Appearance:: Appears stated age Attitude/Demeanor/Rapport: Gracious Affect (typically observed): Accepting Orientation: : Oriented to Self, Oriented to Place, Oriented to  Time, Oriented to Situation Alcohol / Substance Use: Tobacco Use Psych Involvement: No (comment)  Admission diagnosis:  Blood in stool [K92.1] GI bleed [K92.2] ESRD (end  stage renal disease) ([place]) [N18.6] Elevated lipase [R74.8] Patient Active Problem List   Diagnosis Date Noted   Noninfectious gastroenteritis    Adenomatous polyp of sigmoid colon    Acute gastric ulcer with hemorrhage    Duodenal ulcer    GI bleed 12/22/2021   Adrenal nodule ([place]) 12/22/2021   Uremia 05/16/2021   Rectal bleeding  05/15/2021   Acute on chronic combined systolic and diastolic CHF (congestive heart failure) ([place]) 05/15/2021   FTT (failure to thrive) in adult 05/15/2021   Hyperkalemia 03/12/2021   Prolonged QT interval 03/12/2021   Acute respiratory failure with hypoxia and hypercapnia (HCC) 03/12/2021   Acute on chronic diastolic congestive heart failure ([place]) 03/12/2021   Onychomycosis 11/25/2018   Pre-ulcerative calluses 11/25/2018   Neuropathy 10/04/2018   Acute encephalopathy 07/11/2017   ESRD (end stage renal disease) ([place]) 07/11/2017   COPD with acute exacerbation (HCC)    Anemia in ESRD (end-stage renal disease) ([place])    Coagulation defect, unspecified ([place]) 07/09/2017   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 [place] [address] Phone: [phone] Fax: [place], [place] [facility] [place] [address] Phone: [phone] Fax: [phone]     Social Determinants of Health (SDOH) Interventions    Readmission Risk Interventions    09/09/2021   12:39 PM  Readmission Risk Prevention Plan  Transportation Screening Complete  Medication Review ([place]) Complete  PCP or Specialist appointment within 3-5 days of discharge Complete  HRI or [place] Complete  SW Recovery Care/Counseling Consult Complete  [place] Not Applicable

## 2021-12-23 NOTE — Anesthesia Postprocedure Evaluation (Signed)
Anesthesia Post Note  Patient: Kathleen [name]  Procedure(s) Performed: COLONOSCOPY WITH PROPOFOL ESOPHAGOGASTRODUODENOSCOPY (EGD) BIOPSY POLYPECTOMY     Patient location during evaluation: PACU Anesthesia Type: MAC Level of consciousness: awake and alert and oriented Pain management: pain level controlled Vital Signs Assessment: post-procedure vital signs reviewed and stable Respiratory status: spontaneous breathing, nonlabored ventilation and respiratory function stable Cardiovascular status: stable and blood pressure returned to baseline Postop Assessment: no apparent nausea or vomiting Anesthetic complications: no   No notable events documented.  Last Vitals:  Vitals:   12/23/21 1352 12/23/21 1401  BP: (!) 163/84 (!) 174/79  Pulse: 78 81  Resp: 17 17  Temp:    SpO2: 100% 100%    Last Pain:  Vitals:   12/23/21 1341  TempSrc:   PainSc: 10-Worst pain ever                 [name]

## 2021-12-23 NOTE — Care Management Obs Status (Cosign Needed)
[place] NOTIFICATION   Patient Details  Name: Kathleen [name] MRN: [id number] Date of Birth: 1961-04-24   Medicare Observation Status Notification Given:  Yes    Curlene Labrum, RN 12/23/2021, 8:08 AM

## 2021-12-23 NOTE — Anesthesia Preprocedure Evaluation (Addendum)
Anesthesia Evaluation  Patient identified by MRN, date of birth, ID band Patient awake    Reviewed: Allergy & Precautions, NPO status , Patient's Chart, lab work & pertinent test results, reviewed documented beta blocker date and time   History of Anesthesia Complications (+) PONV and history of anesthetic complications  Airway Mallampati: II  TM Distance: >3 FB Neck ROM: Full    Dental  (+) Edentulous Upper, Edentulous Lower   Pulmonary sleep apnea and Continuous Positive Airway Pressure Ventilation , COPD,  COPD inhaler, Current Smoker,    breath sounds clear to auscultation + decreased breath sounds      Cardiovascular hypertension, Pt. on medications and Pt. on home beta blockers +CHF  Normal cardiovascular exam Rhythm:Regular Rate:Normal  LVEF 60-65% 05/2021   Neuro/Psych  Headaches, Seizures -, Well Controlled,  Peripheral neuropathy  Neuromuscular disease negative psych ROS   GI/Hepatic Neg liver ROS, GI Bleeding N/V Abdominal pain   Endo/Other  negative endocrine ROS  Renal/GU Dialysis and ESRFRenal disease  negative genitourinary   Musculoskeletal  (+) Arthritis , Osteoarthritis,    Abdominal   Peds  Hematology  (+) Blood dyscrasia, anemia , SLE   Anesthesia Other Findings   Reproductive/Obstetrics                            Anesthesia Physical Anesthesia Plan  ASA: 4  Anesthesia Plan: MAC   Post-op Pain Management: Minimal or no pain anticipated   Induction: Intravenous  PONV Risk Score and Plan: 2 and Propofol infusion and Treatment may vary due to age or medical condition  Airway Management Planned: Natural Airway and Simple Face Mask  Additional Equipment: None  Intra-op Plan:   Post-operative Plan:   Informed Consent: I have reviewed the patients History and Physical, chart, labs and discussed the procedure including the risks, benefits and alternatives for the  proposed anesthesia with the patient or authorized representative who has indicated his/her understanding and acceptance.     Dental advisory given  Plan Discussed with: CRNA and Anesthesiologist  Anesthesia Plan Comments:         Anesthesia Quick Evaluation

## 2021-12-23 NOTE — Progress Notes (Signed)
Pt receives out-pt HD at [facility] on TTS. Pt has an 11:20 chair time. Noted pt for possible d/c tomorrow. Will f/u with team tomorrow am to inquire if pt stable for d/c tomorrow am in order to receive out-pt HD at her out-pt clinic. Contacted inpt HD unit to request that pt be placed on 2nd shift tomorrow in the event pt stable for d/c in the morning and can receive out-pt HD at regular clinic. Will assist as needed.   Melven Sartorius Renal Navigator [phone]

## 2021-12-23 NOTE — Progress Notes (Signed)
     Daily Progress Note Intern Pager: [phone]  Patient name: Kathleen [name] Medical record number: [id number] Date of birth: [birth year] Age: 61 y.o. Gender: female  Primary Care Provider: Angela Burke, NP Consultants: GI, Nephrology Code Status: FULL  Pt Overview and Major Events to Date:  - Admitted 8/15   Assessment and Plan:  Kathleen [name] is a 61 year old female presenting with hematochezia and admitted for GI bleed work-up. Pertinent PMH/PSH includes ESRD, TTS, SLE, HTN, COPD.   * GI bleed Likely lower GI bleed. Hemoglobin stable at baseline. Patient asymptomatic - weight has remained stable since 09/2021, family reports 12 lb weight loss over the past month.  - GI consulted, appreciate recs - Monitor hemoglobin - NPO  - EGD and colonoscopy scheduled for today.   ESRD (end stage renal disease) ([place]) Dialysis TThSa - Nephrology consulted, recs appreciated - Continue home phosphate binder Renvela 800 mg once per day  Hypertension Blood pressure continues to be borderline hypotensive, patient asymptomatic. - continue holding home amlodipine, Carvedilol, and Hydralazine - Add back antihypertensives if multiple Bps are >140/90  Adrenal nodule ([place]) Likely incidental finding on CTA. - recommended dedicated CT or MRI adrenal protocol, outpatient follow-up  Prolonged QT interval QTC stable from last EKG. On hydroxychloroquine. Magnesium WNL. - cautious with QT-prolonging agents       FEN/GI: NPO PPx: bleeding risk, not on prophylaxis Dispo:Anticipating Home tomorrow. Barriers include further testing pending.   Subjective:  She is doing well this morning with no new complaints. She still has abdominal pain and a mass she is concerned about located in her mid-right upper quadrant that she has not noticed until this morning. Her husband is concerned about her weight loss over the past month.   Objective: Temp:  [97.6 F (36.4 C)-98.8 F (37.1 C)]  98.5 F (36.9 C) (08/15 2048) Pulse Rate:  [78-118] 89 (08/15 2048) Resp:  [12-22] 18 (08/15 2048) BP: (77-138)/(45-90) 93/53 (08/15 2048) SpO2:  [97 %-100 %] 99 % (08/15 2048) Weight:  [45.4 kg] 45.4 kg (08/15 1421) Physical Exam: General: frail in appearance, no acute distress Cardiovascular: regular rate and rhythm, no murmurs appreciated  Respiratory: clear with no wheezing, rhonchi  Abdomen: non-distended, no obvious mass or deformity on inspection, no tenderness or guarding. - liver felt normal in size, with no tenderness  - abdominal mass: located RUQ - RLQ, not tender to touch, not mobile, suspect this is colonic bowel.  Extremities: no peripheral edema   Laboratory: Most recent CBC Lab Results  Component Value Date   WBC 5.7 12/23/2021   HGB 12.3 12/23/2021   HCT 39.3 12/23/2021   MCV 91.0 12/23/2021   PLT 194 12/23/2021   Most recent BMP    Latest Ref Rng & Units 12/23/2021    8:24 AM  BMP  Glucose 70 - 99 mg/dL 87   BUN 8 - 23 mg/dL 26   Creatinine 0.44 - 1.00 mg/dL 8.85   Sodium 135 - 145 mmol/L 138   Potassium 3.5 - 5.1 mmol/L 3.7   Chloride 98 - 111 mmol/L 102   CO2 22 - 32 mmol/L 21   Calcium 8.9 - 10.3 mg/dL 9.3    EKG: sinus rhythm, regular rate, QT mildly prolonged. Otherwise stable from last EKG with no new findings.   Darci Current, DO 12/23/2021, 10:27 AM  PGY-1, [place] Intern pager: [phone], text pages welcome Secure chat group [place]

## 2021-12-23 NOTE — Op Note (Signed)
[facility] Patient Name: Kathleen [name] Procedure Date : 12/23/2021 MRN: [id number] Attending MD: Thornton Park MD, MD Date of Birth: March 15, 1961 CSN: [id number] Age: 61 Admit Type: Outpatient Procedure:                Colonoscopy Indications:              Hematochezia Providers:                Thornton Park MD, MD, Allayne Gitelman, RN, Benetta Spar, Technician Referring MD:              Medicines:                Monitored Anesthesia Care Complications:            No immediate complications. Estimated Blood Loss:     Estimated blood loss was minimal. Procedure:                Pre-Anesthesia Assessment:                           - Prior to the procedure, a History and Physical                            was performed, and patient medications and                            allergies were reviewed. The patient's tolerance of                            previous anesthesia was also reviewed. The risks                            and benefits of the procedure and the sedation                            options and risks were discussed with the patient.                            All questions were answered, and informed consent                            was obtained. Prior Anticoagulants: The patient has                            taken no previous anticoagulant or antiplatelet                            agents. ASA Grade Assessment: III - A patient with                            severe systemic disease. After reviewing the risks                            and  benefits, the patient was deemed in                            satisfactory condition to undergo the procedure.                           - Prior to the procedure, a History and Physical                            was performed, and patient medications and                            allergies were reviewed. The patient's tolerance of                            previous anesthesia was also  reviewed. The risks                            and benefits of the procedure and the sedation                            options and risks were discussed with the patient.                            All questions were answered, and informed consent                            was obtained. Prior Anticoagulants: The patient has                            taken no previous anticoagulant or antiplatelet                            agents. ASA Grade Assessment: III - A patient with                            severe systemic disease. After reviewing the risks                            and benefits, the patient was deemed in                            satisfactory condition to undergo the procedure.                           After obtaining informed consent, the colonoscope                            was passed under direct vision. Throughout the                            procedure, the patient's blood pressure, pulse, and  oxygen saturations were monitored continuously. The                            CF-HQ190L ([id number] Olympus coloscope was                            introduced through the anus and advanced to the 3                            cm into the ileum. The colonoscopy was performed                            with moderate difficulty due to a tortuous colon.                            Successful completion of the procedure was aided by                            applying abdominal pressure. The patient tolerated                            the procedure well. The quality of the bowel                            preparation was good. The terminal ileum, ileocecal                            valve, appendiceal orifice, and rectum were                            photographed. Scope In: 1:05:48 PM Scope Out: 1:20:21 PM Scope Withdrawal Time: 0 hours 8 minutes 58 seconds  Total Procedure Duration: 0 hours 14 minutes 33 seconds  Findings:      The perianal and  digital rectal examinations were normal.      Patchy areas of mildly altered vascular, congested and erythematous       mucosa were found in the rectum, in the sigmoid colon, in the descending       colon and at the splenic flexure. Biopsies were taken with a cold       forceps for histology.      A 4 mm polyp was found in the rectum. The polyp was flat. The polyp was       removed with a cold snare. Resection and retrieval were complete.       Estimated blood loss was minimal.      The exam was otherwise without abnormality on direct and retroflexion       views. Impression:               - Altered vascular, congested and erythematous                            mucosa in the rectum, in the sigmoid colon,                            descending colon,  and splenic flexure. This is of                            unclear clinical significance but most consider                            infectious etiologies and ischemic colitis,                            although endoscopic appearance and location of                            findings is not classic for ischemic colitis.                            Biopsied.                           - One 4 mm polyp in the rectum, removed with a cold                            snare. Resected and retrieved.                           - The examination was otherwise normal on direct                            and retroflexion views. Recommendation:           - Return patient to hospital ward for ongoing care.                           - Advance diet as tolerated.                           - Continue present medications.                           - GI pathogen panel.                           - Await pathology results.                           - Repeat colonoscopy date to be determined after                            pending pathology results are reviewed for                            surveillance.                           - Emerging evidence supports  eating a diet of                            fruits, vegetables, grains,  calcium, and yogurt                            while reducing red meat and alcohol may reduce the                            risk of colon cancer.                           No additional inpatient GI evaluation planned at                            this time. Please call the on-call                            gastroenterologist with any additional questions or                            concerns during this hospitalization. Procedure Code(s):        --- Professional ---                           [phone], Colonoscopy, flexible; with removal of                            tumor(s), polyp(s), or other lesion(s) by snare                            technique                           [id number], 70, Colonoscopy, flexible; with biopsy,                            single or multiple Diagnosis Code(s):        --- Professional ---                           K63.89, Other specified diseases of intestine                           K62.89, Other specified diseases of anus and rectum                           K62.1, Rectal polyp                           K92.1, Melena (includes Hematochezia) CPT copyright 2019 American Medical Association. All rights reserved. The codes documented in this report are preliminary and upon coder review may  be revised to meet current compliance requirements. Thornton Park MD, MD 12/23/2021 1:46:40 PM This report has been signed electronically. Number of Addenda: 0

## 2021-12-23 NOTE — Transfer of Care (Signed)
Immediate Anesthesia Transfer of Care Note  Patient: Kathleen [name]  Procedure(s) Performed: COLONOSCOPY WITH PROPOFOL ESOPHAGOGASTRODUODENOSCOPY (EGD) BIOPSY POLYPECTOMY  Patient Location: PACU  Anesthesia Type:MAC  Level of Consciousness: awake, alert  and oriented  Airway & Oxygen Therapy: Patient Spontanous Breathing  Post-op Assessment: Report given to RN and Post -op Vital signs reviewed and stable  Post vital signs: Reviewed and stable  Last Vitals:  Vitals Value Taken Time  BP 173/99 12/23/21 1342  Temp    Pulse 180 12/23/21 1342  Resp 15 12/23/21 1345  SpO2 100 % 12/23/21 1342  Vitals shown include unvalidated device data.  Last Pain:  Vitals:   12/23/21 1341  TempSrc:   PainSc: 10-Worst pain ever         Complications: No notable events documented.

## 2021-12-23 NOTE — Evaluation (Signed)
Occupational Therapy Evaluation and Discharge Patient Details Name: Kathleen [name] MRN: [id number] DOB: 1961-04-13 Today's Date: 12/23/2021   History of Present Illness Pt is a 61 y/o female admitted secondary to hypotension and blood in stool. PMH includes ESRD on HD, HTN, and lupus.   Clinical Impression   Pt is functioning modified independently with use of RW. She has been routinely ambulating to the bathroom without assist. No further OT needs.      Recommendations for follow up therapy are one component of a multi-disciplinary discharge planning process, led by the attending physician.  Recommendations may be updated based on patient status, additional functional criteria and insurance authorization.   Follow Up Recommendations  No OT follow up    Assistance Recommended at Discharge PRN  Patient can return home with the following      Functional Status Assessment  Patient has had a recent decline in their functional status and demonstrates the ability to make significant improvements in function in a reasonable and predictable amount of time.  Equipment Recommendations  None recommended by OT    Recommendations for Other Services       Precautions / Restrictions Precautions Precautions: Fall Restrictions Weight Bearing Restrictions: No      Mobility Bed Mobility Overal bed mobility: Independent             General bed mobility comments: flat bed    Transfers Overall transfer level: Modified independent Equipment used: Rolling walker (2 wheels)               General transfer comment: used RW, buckling or difficulty stand from low surface      Balance Overall balance assessment: Needs assistance   Sitting balance-Leahy Scale: Normal     Standing balance support: No upper extremity supported, Single extremity supported Standing balance-Leahy Scale: Fair Standing balance comment: can release walker in static standing                            ADL either performed or assessed with clinical judgement   ADL Overall ADL's : Modified independent                                             Vision Ability to See in Adequate Light: 0 Adequate       Perception     Praxis      Pertinent Vitals/Pain Pain Assessment Pain Assessment: Faces Faces Pain Scale: No hurt     Hand Dominance Right   Extremity/Trunk Assessment Upper Extremity Assessment Upper Extremity Assessment: Overall WFL for tasks assessed   Lower Extremity Assessment Lower Extremity Assessment: Defer to PT evaluation   Cervical / Trunk Assessment Cervical / Trunk Assessment: Normal   Communication Communication Communication: No difficulties   Cognition Arousal/Alertness: Awake/alert Behavior During Therapy: WFL for tasks assessed/performed Overall Cognitive Status: Within Functional Limits for tasks assessed                                 General Comments: grandfather in room and is concerned about pt's choices, pt admits to not wanting to "follow the rules"     General Comments       Exercises     Shoulder Instructions      Home Living  Family/patient expects to be discharged to:: Shelter/Homeless                                 Additional Comments: Has been sleeping in her car as she has been unable to stay in motel due to cost.      Prior Functioning/Environment Prior Level of Function : Independent/Modified Independent;Driving                        OT Problem List:        OT Treatment/Interventions:      OT Goals(Current goals can be found in the care plan section)    OT Frequency:      Co-evaluation              AM-PAC OT "6 Clicks" Daily Activity     Outcome Measure Help from another person eating meals?: None Help from another person taking care of personal grooming?: None Help from another person toileting, which includes using toliet, bedpan,  or urinal?: None Help from another person bathing (including washing, rinsing, drying)?: None Help from another person to put on and taking off regular upper body clothing?: None Help from another person to put on and taking off regular lower body clothing?: None 6 Click Score: 24   End of Session Equipment Utilized During Treatment: Rolling walker (2 wheels)  Activity Tolerance: Patient tolerated treatment well Patient left: in bed;with call bell/phone within reach;with family/visitor present  OT Visit Diagnosis: Unsteadiness on feet (R26.81)                Time: 6256-3893 OT Time Calculation (min): 14 min Charges:  OT General Charges $OT Visit: 1 Visit OT Evaluation $OT Eval Low Complexity: 1 Low  Cleta Alberts, OTR/L Acute Rehabilitation Services Office: [phone]   [name] 12/23/2021, 10:52 AM

## 2021-12-23 NOTE — Progress Notes (Addendum)
PT Cancellation Note  Patient Details Name: Kathleen [name] MRN: [id number] DOB: 1961/01/30   Cancelled Treatment:    Reason Eval/Treat Not Completed: (P) Patient at procedure or test/unavailable (pt off floor for procedure.) 10:45AM Reattempt 5:40pm, pt refused, seen to be up ad lib in room modI with and without assistive device. Will continue efforts next date as schedule allows for stair training if pt agreeable.   [name] [name] 12/23/2021, 10:44 AM

## 2021-12-23 NOTE — Progress Notes (Signed)
Pt reminded of NPO status, all need addressed

## 2021-12-23 NOTE — Interval H&P Note (Signed)
History and Physical Interval Note:  12/23/2021 10:45 AM  Kathleen [name]  has presented today for surgery, with the diagnosis of Nausea, vomiting.  Dark vomit, passing blood.  Abdominal pain.  The various methods of treatment have been discussed with the patient and family. After consideration of risks, benefits and other options for treatment, the patient has consented to  Procedure(s): COLONOSCOPY WITH PROPOFOL (N/A) ESOPHAGOGASTRODUODENOSCOPY (EGD) (N/A) as a surgical intervention.  The patient's history has been reviewed, patient examined, no change in status, stable for surgery.  I have reviewed the patient's chart and labs.  Questions were answered to the patient's satisfaction.     [place]

## 2021-12-24 ENCOUNTER — Other Ambulatory Visit (HOSPITAL_COMMUNITY): Payer: Self-pay

## 2021-12-24 DIAGNOSIS — E278 Other specified disorders of adrenal gland: Secondary | ICD-10-CM | POA: Diagnosis present

## 2021-12-24 DIAGNOSIS — M3214 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: Secondary | ICD-10-CM | POA: Diagnosis present

## 2021-12-24 DIAGNOSIS — K921 Melena: Principal | ICD-10-CM

## 2021-12-24 DIAGNOSIS — F5089 Other specified eating disorder: Secondary | ICD-10-CM | POA: Diagnosis present

## 2021-12-24 DIAGNOSIS — Z8616 Personal history of COVID-19: Secondary | ICD-10-CM | POA: Diagnosis not present

## 2021-12-24 DIAGNOSIS — Z681 Body mass index (BMI) 19 or less, adult: Secondary | ICD-10-CM | POA: Diagnosis not present

## 2021-12-24 DIAGNOSIS — K259 Gastric ulcer, unspecified as acute or chronic, without hemorrhage or perforation: Secondary | ICD-10-CM | POA: Diagnosis present

## 2021-12-24 DIAGNOSIS — J449 Chronic obstructive pulmonary disease, unspecified: Secondary | ICD-10-CM | POA: Diagnosis present

## 2021-12-24 DIAGNOSIS — I132 Hypertensive heart and chronic kidney disease with heart failure and with stage 5 chronic kidney disease, or end stage renal disease: Secondary | ICD-10-CM | POA: Diagnosis present

## 2021-12-24 DIAGNOSIS — K250 Acute gastric ulcer with hemorrhage: Secondary | ICD-10-CM | POA: Diagnosis not present

## 2021-12-24 DIAGNOSIS — D125 Benign neoplasm of sigmoid colon: Secondary | ICD-10-CM | POA: Diagnosis not present

## 2021-12-24 DIAGNOSIS — N2581 Secondary hyperparathyroidism of renal origin: Secondary | ICD-10-CM | POA: Diagnosis present

## 2021-12-24 DIAGNOSIS — K645 Perianal venous thrombosis: Secondary | ICD-10-CM | POA: Diagnosis present

## 2021-12-24 DIAGNOSIS — Z992 Dependence on renal dialysis: Secondary | ICD-10-CM | POA: Diagnosis not present

## 2021-12-24 DIAGNOSIS — R9431 Abnormal electrocardiogram [ECG] [EKG]: Secondary | ICD-10-CM | POA: Diagnosis present

## 2021-12-24 DIAGNOSIS — Z5902 Unsheltered homelessness: Secondary | ICD-10-CM | POA: Diagnosis not present

## 2021-12-24 DIAGNOSIS — E876 Hypokalemia: Secondary | ICD-10-CM | POA: Diagnosis present

## 2021-12-24 DIAGNOSIS — Z8673 Personal history of transient ischemic attack (TIA), and cerebral infarction without residual deficits: Secondary | ICD-10-CM | POA: Diagnosis not present

## 2021-12-24 DIAGNOSIS — G40909 Epilepsy, unspecified, not intractable, without status epilepticus: Secondary | ICD-10-CM | POA: Diagnosis present

## 2021-12-24 DIAGNOSIS — Z5900 Homelessness unspecified: Secondary | ICD-10-CM

## 2021-12-24 DIAGNOSIS — R64 Cachexia: Secondary | ICD-10-CM | POA: Diagnosis present

## 2021-12-24 DIAGNOSIS — N186 End stage renal disease: Secondary | ICD-10-CM | POA: Diagnosis not present

## 2021-12-24 DIAGNOSIS — I5042 Chronic combined systolic (congestive) and diastolic (congestive) heart failure: Secondary | ICD-10-CM | POA: Diagnosis present

## 2021-12-24 DIAGNOSIS — I959 Hypotension, unspecified: Secondary | ICD-10-CM | POA: Diagnosis present

## 2021-12-24 DIAGNOSIS — D631 Anemia in chronic kidney disease: Secondary | ICD-10-CM | POA: Diagnosis present

## 2021-12-24 DIAGNOSIS — K269 Duodenal ulcer, unspecified as acute or chronic, without hemorrhage or perforation: Secondary | ICD-10-CM | POA: Diagnosis present

## 2021-12-24 LAB — BASIC METABOLIC PANEL
Anion gap: 16 — ABNORMAL HIGH (ref 5–15)
BUN: 42 mg/dL — ABNORMAL HIGH (ref 8–23)
CO2: 23 mmol/L (ref 22–32)
Calcium: 8.9 mg/dL (ref 8.9–10.3)
Chloride: 97 mmol/L — ABNORMAL LOW (ref 98–111)
Creatinine, Ser: 11.03 mg/dL — ABNORMAL HIGH (ref 0.44–1.00)
GFR, Estimated: 4 mL/min — ABNORMAL LOW (ref >60)
Glucose, Bld: 97 mg/dL (ref 70–99)
Potassium: 3.6 mmol/L (ref 3.5–5.1)
Sodium: 136 mmol/L (ref 135–145)

## 2021-12-24 LAB — CBC
HCT: 36.2 % (ref 36.0–46.0)
Hemoglobin: 11.5 g/dL — ABNORMAL LOW (ref 12.0–15.0)
MCH: 28.3 pg (ref 26.0–34.0)
MCHC: 31.8 g/dL (ref 30.0–36.0)
MCV: 89.2 fL (ref 80.0–100.0)
Platelets: 160 10*3/uL (ref 150–400)
RBC: 4.06 MIL/uL (ref 3.87–5.11)
RDW: 17.9 % — ABNORMAL HIGH (ref 11.5–15.5)
WBC: 6.7 10*3/uL (ref 4.0–10.5)
nRBC: 0 % (ref 0.0–0.2)

## 2021-12-24 LAB — SURGICAL PATHOLOGY

## 2021-12-24 MED ORDER — PANTOPRAZOLE SODIUM 40 MG PO TBEC
40.0000 mg | DELAYED_RELEASE_TABLET | Freq: Two times a day (BID) | ORAL | 0 refills | Status: DC
  Filled 2021-12-24: qty 60, 30d supply, fill #0
  Filled 2021-12-24: qty 110, 55d supply, fill #0

## 2021-12-24 MED ORDER — NICOTINE 14 MG/24HR TD PT24
14.0000 mg | MEDICATED_PATCH | Freq: Every day | TRANSDERMAL | 0 refills | Status: DC
  Filled 2021-12-24: qty 28, 28d supply, fill #0

## 2021-12-24 MED ORDER — HYDRALAZINE HCL 50 MG PO TABS: 50.0000 mg | ORAL_TABLET | Freq: Two times a day (BID) | ORAL | Status: DC

## 2021-12-24 MED ORDER — PANTOPRAZOLE SODIUM 40 MG PO TBEC
40.0000 mg | DELAYED_RELEASE_TABLET | Freq: Every day | ORAL | 11 refills | Status: DC
  Filled 2021-12-24: qty 30, 30d supply, fill #0

## 2021-12-24 MED ORDER — CARVEDILOL 6.25 MG PO TABS
6.2500 mg | ORAL_TABLET | Freq: Two times a day (BID) | ORAL | Status: DC
  Administered 2021-12-24 – 2021-12-25 (×2): 6.25 mg via ORAL
  Filled 2021-12-24 (×2): qty 1

## 2021-12-24 NOTE — Progress Notes (Signed)
  [facility] Progress Note    Assessment/ Plan:   ESRD:  -outpatient HD orders: [name]. 3hrs 11mn. F160. Flow rates: 350/autoflow 1.5. 2k/2cal. UF profile 2. AVG. Meds: sensipar '60mg'$  qtreatment. Heparin 3600 units bolus. Mircera 769m q2weeks (supposed to start 8/15) -HD on TTS schedule, no heparin for now. HD today -renally dose meds   Hypotension Volume/ h/o hypertension: -low BP likely related to bleed + vomiting/poor po intake on admit. Home anti-HTN on hold, BP now stable, more so on the hypertensive side -will need to readjust EDW post d/c (EDW was being lowered as an outpatient) -UF as tolerated   BRBPR, Anemia of Chronic Kidney Disease: Hemoglobin 11.5, has not received esa's yet as an outpatient. Hgb currently at goal, will hold off on starting ESAs for now. -transfuse prn for hgb < 7 -GI consulted, EGD/scope 8/16: no active bleed seen   Secondary Hyperparathyroidism/Hyperphosphatemia: Renvela and sensipar resumed    Subjective:   Patient seen and examined in room. No acute events. Poss d/c tomorrow. No complaints   Objective:   BP (!) 149/84 (BP Location: Right Leg)   Pulse 82   Temp 98.2 F (36.8 C) (Oral)   Resp 18   Ht '5\' 4"'$  (1.626 m)   Wt 45.4 kg   SpO2 100%   BMI 17.18 kg/m   Intake/Output Summary (Last 24 hours) at 12/24/2021 0854 Last data filed at 12/23/2021 1910 Gross per 24 hour  Intake 640 ml  Output --  Net 640 ml   Weight change: 0 kg  Physical Exam: Gen:NAD CVS:RRR Resp:normal WOB AbPFX:TKWInt/ND Ext:no sig edema Neuro: awake, alert Dialysis access: LUE AVG +b/t  Imaging: No results found.  Labs: BMET Recent Labs  Lab 12/19/21 2218 12/21/21 1834 12/22/21 1430 12/23/21 0824 12/24/21 0502  NA 135 136 132* 138 136  K 3.9 3.7 3.2* 3.7 3.6  CL 91* 94* 89* 102 97*  CO2 30 21* 23 21* 23  GLUCOSE 111* 90 139* 87 97  BUN 13 44* 55* 26* 42*  CREATININE 6.06* 11.08* 12.81* 8.85* 11.03*  CALCIUM 9.4 9.6 8.9 9.3  8.9  PHOS  --   --  8.2*  --   --    CBC Recent Labs  Lab 12/21/21 1834 12/22/21 0319 12/23/21 0824 12/24/21 0502  WBC 8.3 7.4 5.7 6.7  NEUTROABS 5.4  --   --   --   HGB 13.2 11.9* 12.3 11.5*  HCT 42.5 37.1 39.3 36.2  MCV 92.0 89.8 91.0 89.2  PLT 166 161 194 160    Medications:     acetaminophen  1,000 mg Oral BID   Chlorhexidine Gluconate Cloth  6 each Topical Q0600   cinacalcet  60 mg Oral Q T,Th,Sa-HD   hydroxychloroquine  200 mg Oral Daily   multivitamin  1 tablet Oral QHS   nicotine  14 mg Transdermal Daily   pantoprazole  40 mg Oral BID   sevelamer carbonate  2,400 mg Oral TID WC      ViGean QuintMD [facility] 12/24/2021, 8:54 AM

## 2021-12-24 NOTE — Progress Notes (Signed)
Daily Progress Note Intern Pager: [phone]  Patient name: Kathleen [name] Medical record number: [id number] Date of birth: 1960-11-02 Age: 61 y.o. Gender: female  Primary Care Provider: Angela Burke, NP Consultants: GI, nephrology  Code Status: FULL  Pt Overview and Major Events to Date:  - Admitted 8/15  Assessment and Plan:  Kathleen [name] is a 61 year old female presenting with hematochezia and admitted for GI bleed work-up. Pertinent PMH/PSH includes ESRD, TTS, SLE, HTN, COPD.  * GI bleed S/p EGD and colonoscopy.  EGD: Showed multiple nonbleeding gastric and duodenal ulcers, H. Pylori negative on biopsy.  Colonoscopy: 1 polyp biopsy, no source of bleeding identified.  Hemoglobin stable at baseline. Patient asymptomatic - Per GI, start 40 mg pantoprazole twice daily for 8 weeks, then reduce to 40 mg daily. - weight has remained stable since 09/2021, family reports 12 lb weight loss over the past month.  - GI consulted, appreciate recs - Full diet - Discontinue daily CBC and CMP    Hypertension Multiple readings of blood pressures greater than 140/90. - restart home coreg  - continue holding hydralazine, and amlodipine   ESRD (end stage renal disease) ([place]) Dialysis TThSa - Nephrology consulted, recs appreciated - Continue home phosphate binder Renvela 800 mg once per day  Adrenal nodule ([place]) Likely incidental finding on CTA. - recommended dedicated CT or MRI adrenal protocol, outpatient follow-up  Prolonged QT interval QTC stable from last EKG. On hydroxychloroquine. Magnesium WNL. - cautious with QT-prolonging agents   FEN/GI: full diet  PPx: bleeding risk, not on prophylaxis  Dispo:Home today. Barriers include HD.   Subjective:  Patient is doing well this morning.  No acute events overnight.  She has no new concerns today and is feeling well.  She was able to eat breakfast.  Objective: Temp:  [98.2 F (36.8 C)-98.9 F (37.2 C)] 98.9 F  (37.2 C) (08/17 0920) Pulse Rate:  [78-91] 86 (08/17 0920) Resp:  [11-18] 18 (08/17 0920) BP: (98-174)/(55-99) 118/80 (08/17 0920) SpO2:  [99 %-100 %] 100 % (08/17 0920) Physical Exam: General: Well-appearing in no acute distress Cardiovascular: Regular rate and rhythm, no murmurs appreciated Respiratory: Clear, no wheezing, no crackles, no consolidations Abdomen: Soft nontender Extremities: No peripheral edema  Laboratory: Most recent CBC Lab Results  Component Value Date   WBC 6.7 12/24/2021   HGB 11.5 (L) 12/24/2021   HCT 36.2 12/24/2021   MCV 89.2 12/24/2021   PLT 160 12/24/2021   Most recent BMP    Latest Ref Rng & Units 12/24/2021    5:02 AM  BMP  Glucose 70 - 99 mg/dL 97   BUN 8 - 23 mg/dL 42   Creatinine 0.44 - 1.00 mg/dL 11.03   Sodium 135 - 145 mmol/L 136   Potassium 3.5 - 5.1 mmol/L 3.6   Chloride 98 - 111 mmol/L 97   CO2 22 - 32 mmol/L 23   Calcium 8.9 - 10.3 mg/dL 8.9     EGD:  - Normal esophagus - Nonbleeding gastric ulcer, 10 mm large - Multiple small nonbleeding superficial duodenal ulcers - 4 to 5 mm ulcer in duodenal bulb - Recommendation: pantoprazole 40 mg twice daily for 8 weeks, then reduce to 40 mg daily  Colonoscopy: - Normal perianal and rectal exam - 4 mm polyp found in the rectum   Darci Current, DO 12/24/2021, 11:20 AM  PGY-1, [place] Intern pager: [phone], text pages welcome Secure chat group [place]  Service

## 2021-12-24 NOTE — Discharge Summary (Deleted)
[facility] Discharge Summary  Patient name: Kathleen [name] record number: [id number] Date of birth: 1961-02-04 Age: 61 y.o. Gender: female Date of Admission: 12/21/2021  Date of Discharge: 12/24/21  Admitting Physician: Leslie Dales, MD  Primary Care Provider: Angela Burke, NP Consultants: GI, nephrology   Indication for Hospitalization: [place] Hospital Course:  Kathleen [name] is a 61 year old female who presented with bright red blood per rectum.  Patient was admitted to family medicine teaching service with suspected lower GI bleed.  Please see problem based hospital course below:  GI bleed Patient presented after multiple episodes of bloody hematochezia which continued in ED.  Blood pressure was decreased to systolic 34H on admission.  This improved with a small 250 mL bolus of saline.  Hemoglobin on admission was 13.2.  Patient's baseline hemoglobin ranges from 10-11.  Consecutive CBC was 11.9.  GI was consulted.  They recommended upper and lower endoscopy. EGD showed multiple non-bleeding gastric and duodenal ulcers with negative H. Pylori on biopsy. Colonoscopy was normal with multiple polyps biopsied. GI recommended pantoprazole 40 mg BID for 8 weeks and reduction to 40 mg daily after for maintenance.  Follow-up EGD recommended in 8 to 10 weeks to check on ulcers.  ESRD Patient on scheduled dialysis TThSa. Nephrology consulted. Patient on normal dialysis schedule while in hospital.  Prolonged QT interval EKG on admission showed QTc of 540. Patient asymptomatic through admission. Repeat EKG demonstrate EKG of 503. Hydroxychloroquine was continued.   Discharge Diagnoses/Problem List:  Principal Problem for Admission: Gastrointestinal hemorrhage Other Problems addressed during stay:  - ESRD - Hypotension   Disposition: Home  Discharge Condition: Stable  Discharge Exam:  Vitals:   12/24/21 0500 12/24/21 0920  BP: (!)  149/84 118/80  Pulse: 82 86  Resp: 18 18  Temp: 98.2 F (36.8 C) 98.9 F (37.2 C)  SpO2: 100% 100%   Physical Exam: General: Well-appearing in no acute distress Cardiovascular: Regular rate and rhythm, no murmurs appreciated Respiratory: Clear, no wheezing, no crackles, no consolidations Abdomen: Soft nontender Extremities: No peripheral edema    Issues for Follow Up:  Incidentally found to have a 1.1 cm right adrenal nodule on CTA.  Recommended to have dedicated CT or MRI imaging with adrenal protocol. Repeat EGD in 8 to 10 weeks. Given her soft blood pressures, her amlodipine and hydralazine were held at discharge.  Recheck blood pressure at follow-up and add back as appropriate.  Significant Procedures:  EGD:  - Normal esophagus - Nonbleeding gastric ulcer, 10 mm large - Multiple small nonbleeding superficial duodenal ulcers - 4 to 5 mm ulcer in duodenal bulb - Recommendation: pantoprazole 40 mg twice daily for 8 weeks, then reduce to 40 mg daily   Colonoscopy: - Normal perianal and rectal exam - 4 mm polyp found in the rectum  Significant Labs and Imaging:  Recent Labs  Lab 12/23/21 0824 12/24/21 0502  WBC 5.7 6.7  HGB 12.3 11.5*  HCT 39.3 36.2  PLT 194 160   Recent Labs  Lab 12/22/21 1430 12/23/21 0824 12/24/21 0502  NA 132* 138 136  K 3.2* 3.7 3.6  CL 89* 102 97*  CO2 23 21* 23  GLUCOSE 139* 87 97  BUN 55* 26* 42*  CREATININE 12.81* 8.85* 11.03*  CALCIUM 8.9 9.3 8.9  PHOS 8.2*  --   --   ALBUMIN 3.3*  --   --     CTA:  No evidence of acute hemorrhage or  active hemorrhage. Aortic atherosclerosis with no evidence of aneurysm. Atherosclerotic calcification at renal ostia bilaterally with moderate gnosis on the right and severe stenosis on the left. High-grade stenosis/possible occlusion involving the proximal internal iliac artery on the left with reconstitution.  CT: No evidence of bowel obstruction, hemorrhage, acute inflammatory changes. Mild  gastric wall thickening, possible gastritis Cholelithiasis  Right adrenal nodule measuring 1.1 cm with enhancement on portal venous vein phase.  Dedicated CT or MRI imaging with adrenal protocol is recommended.   Results/Tests Pending at Time of Discharge: None  Discharge Medications:  Allergies as of 12/24/2021   No Known Allergies      Medication List     STOP taking these medications    amLODipine 10 MG tablet Commonly known as: NORVASC   hydrALAZINE 50 MG tablet Commonly known as: APRESOLINE   ondansetron 4 MG tablet Commonly known as: ZOFRAN   sucralfate 1 g tablet Commonly known as: Carafate       TAKE these medications    acetaminophen 325 MG tablet Commonly known as: TYLENOL Take 650 mg by mouth every 6 (six) hours as needed for pain.   carvedilol 3.125 MG tablet Commonly known as: COREG Take 1 tablet (3.125 mg total) by mouth 2 (two) times daily with a meal. What changed: how much to take   hydroxychloroquine 200 MG tablet Commonly known as: PLAQUENIL Take 200 mg by mouth daily.   multivitamin Tabs tablet Take 1 tablet by mouth daily.   nicotine 14 mg/24hr patch Commonly known as: NICODERM CQ - dosed in mg/24 hours Place 1 patch (14 mg total) onto the skin daily. Start taking on: December 25, 2021   pantoprazole 40 MG tablet Commonly known as: PROTONIX Take 1 tablet (40 mg total) by mouth 2 (two) times daily for 8 weeks.   pantoprazole 40 MG tablet Commonly known as: Protonix Take 1 tablet (40 mg total) by mouth daily. Start taking on: February 18, 2022   sevelamer carbonate 800 MG tablet Commonly known as: RENVELA Take 800-2,400 mg by mouth See admin instructions. 2400 mg with meals 800 mg with snacks        Discharge Instructions: Please refer to Patient Instructions section of EMR for full details.  Patient was counseled important signs and symptoms that should prompt return to medical care, changes in medications, dietary instructions,  activity restrictions, and follow up appointments.   Follow-Up Appointments:   Darci Current, DO 12/24/2021, 1:16 PM PGY-1, [place] Upper-Level Resident Addendum   I have independently interviewed and examined the patient. I have discussed the above with the original author and agree with their documentation. My edits for correction/addition/clarification are included where appropriate. Please see also any attending notes.   Sharion Settler, DO PGY-3, [facility] 12/24/2021 1:26 PM  FPTS Service pager: [phone] (text pages welcome through [facility])

## 2021-12-24 NOTE — Progress Notes (Signed)
Physical Therapy Treatment Patient Details Name: Kathleen [name] MRN: [id number] DOB: 1961-02-13 Today's Date: 12/24/2021   History of Present Illness Pt is a 61 y/o female admitted secondary to hypotension and blood in stool. PMH includes ESRD on HD, HTN, and lupus.    PT Comments    Pt received in supine, agreeable to therapy session with encouragement and with good participation and tolerance for stair and gait trial in hallway using rollator. Pt with good device management and safety awareness, needs reminder for use of UE support for safety with stair ascent/descent otherwise no concerns. Pt has met acute PT goals, anticipate DC next date from acute PT unless new concerns arise, supervising PT notified. Pt continues to benefit from PT services to progress toward functional mobility goals, mobility specialist notified she would benefit from daily ambulation.  Recommendations for follow up therapy are one component of a multi-disciplinary discharge planning process, led by the attending physician.  Recommendations may be updated based on patient status, additional functional criteria and insurance authorization.  Follow Up Recommendations  Outpatient PT     Assistance Recommended at Discharge Intermittent Supervision/Assistance  Patient can return home with the following Assistance with cooking/housework;A little help with walking and/or transfers;A little help with bathing/dressing/bathroom   Equipment Recommendations  Rollator (4 wheels)    Recommendations for Other Services       Precautions / Restrictions Precautions Precautions: Fall Restrictions Weight Bearing Restrictions: No     Mobility  Bed Mobility Overal bed mobility: Independent             General bed mobility comments: flat bed    Transfers Overall transfer level: Modified independent Equipment used: Rolling walker (2 wheels)               General transfer comment: pt reaching for UE support  upon standing but no LOB    Ambulation/Gait Ambulation/Gait assistance: Modified independent (Device/Increase time) Gait Distance (Feet): 500 Feet Assistive device: Rollator (4 wheels) Gait Pattern/deviations: Step-through pattern       General Gait Details: good use of rollator, VSS on RA, no LOB; performed head turns and upward/downward gaze as well as pivots and moving around objects without LOB   Stairs Stairs: Yes Stairs assistance: Supervision, Modified independent (Device/Increase time) Stair Management: Step to pattern, Forwards Number of Stairs: 10 General stair comments: modI when using single UE support (counter next to platform step in room), supervision when unsupported for safety but no overt LOB.   Wheelchair Mobility    Modified Rankin (Stroke Patients Only)       Balance Overall balance assessment: Needs assistance   Sitting balance-Leahy Scale: Normal     Standing balance support: No upper extremity supported, Single extremity supported Standing balance-Leahy Scale: Good Standing balance comment: can release walker in static standing, but tends to reach out for furniture with dynamic standing tasks without AD. modI with rollator in hallway                            Cognition Arousal/Alertness: Awake/alert Behavior During Therapy: [facility] for tasks assessed/performed Overall Cognitive Status: Within Functional Limits for tasks assessed                                 General Comments: family member in room        Exercises      General Comments General  comments (skin integrity, edema, etc.): VSS on RA; HR 70's-80's bpm and SpO2 >88% on RA      Pertinent Vitals/Pain Pain Assessment Pain Assessment: No/denies pain Faces Pain Scale: No hurt Pain Intervention(s): Monitored during session     PT Goals (current goals can now be found in the care plan section) Acute Rehab PT Goals Patient Stated Goal: to keep my  strength up and find a place to stay PT Goal Formulation: With patient Time For Goal Achievement: 01/05/22 Progress towards PT goals: Progressing toward goals (pt has met goals, needs PT goal update or DC acute PT next session)    Frequency    Min 3X/week      PT Plan Current plan remains appropriate       AM-PAC PT "6 Clicks" Mobility   Outcome Measure  Help needed turning from your back to your side while in a flat bed without using bedrails?: None Help needed moving from lying on your back to sitting on the side of a flat bed without using bedrails?: None Help needed moving to and from a bed to a chair (including a wheelchair)?: None Help needed standing up from a chair using your arms (e.g., wheelchair or bedside chair)?: None Help needed to walk in hospital room?: None (with AD no assist) Help needed climbing 3-5 steps with a railing? : A Little 6 Click Score: 23    End of Session Equipment Utilized During Treatment: Gait belt Activity Tolerance: Patient tolerated treatment well Patient left: in bed;with call bell/phone within reach;with family/visitor present Nurse Communication: Mobility status PT Visit Diagnosis: Unsteadiness on feet (R26.81);Muscle weakness (generalized) (M62.81)     Time: 4403-4742 PT Time Calculation (min) (ACUTE ONLY): 16 min  Charges:  $Gait Training: 8-22 mins                     Brooklen Runquist P., PTA Acute Rehabilitation Services Secure Chat Preferred 9a-5:30pm Office: [phone]    Angus Palms 12/24/2021, 5:19 PM

## 2021-12-24 NOTE — Plan of Care (Signed)
  Problem: Education: Goal: Knowledge of General Education information will improve Description: Including pain rating scale, medication(s)/side effects and non-pharmacologic comfort measures Outcome: Progressing   Problem: Elimination: Goal: Will not experience complications related to urinary retention Outcome: Progressing   Problem: Pain Managment: Goal: General experience of comfort will improve Outcome: Progressing   Problem: Skin Integrity: Goal: Risk for impaired skin integrity will decrease Outcome: Progressing

## 2021-12-24 NOTE — Progress Notes (Signed)
Contacted attending staff this morning regarding possible d/c date in an attempt to avoid HD on day of d/c. Advised that plan is for pt to possibly d/c tomorrow.   Melven Sartorius Renal Navigator [phone]

## 2021-12-25 ENCOUNTER — Other Ambulatory Visit (HOSPITAL_COMMUNITY): Payer: Self-pay

## 2021-12-25 DIAGNOSIS — E876 Hypokalemia: Secondary | ICD-10-CM

## 2021-12-25 DIAGNOSIS — N186 End stage renal disease: Secondary | ICD-10-CM | POA: Diagnosis not present

## 2021-12-25 DIAGNOSIS — K921 Melena: Secondary | ICD-10-CM | POA: Diagnosis not present

## 2021-12-25 DIAGNOSIS — D125 Benign neoplasm of sigmoid colon: Secondary | ICD-10-CM | POA: Diagnosis not present

## 2021-12-25 DIAGNOSIS — K250 Acute gastric ulcer with hemorrhage: Secondary | ICD-10-CM | POA: Diagnosis not present

## 2021-12-25 NOTE — Progress Notes (Signed)
D/C order noted. Contacted [facility] to advise clinic of pt's d/c today and that pt will resume care tomorrow.   Melven Sartorius Renal Navigator [phone]

## 2021-12-25 NOTE — Progress Notes (Addendum)
Patient prepared and left unit for dialysis unit on bed.

## 2021-12-25 NOTE — Progress Notes (Signed)
Patient remains out of department at HDU

## 2021-12-25 NOTE — Progress Notes (Signed)
Discharge instructions (including medications) discussed with and copy provided to patient/caregiver 

## 2021-12-25 NOTE — Discharge Summary (Addendum)
[facility] Discharge Summary  Patient name: Kathleen [name] record number: [id number] Date of birth: 11/21/1960 Age: 61 y.o. Gender: female Date of Admission: 12/21/2021  Date of Discharge: 12/25/21  Admitting Physician: Leslie Dales, MD  Primary Care Provider: Angela Burke, NP Consultants: GI, nephrology   Indication for Hospitalization: [facility] Hospital Course:  Kathleen [name] is a 61 year old female who presented with bright red blood per rectum.  Patient was admitted to family medicine teaching service with suspected lower GI bleed.  Please see problem based hospital course below:  GI bleed Patient presented after multiple episodes of bloody hematochezia which continued in ED.  Blood pressure was decreased to systolic 54Y on admission.  This improved with a small 250 mL bolus of saline.  Hemoglobin on admission was 13.2.  Patient's baseline hemoglobin ranges from 10-11.  Consecutive CBC was 11.9.  GI was consulted.  They recommended upper and lower endoscopy. EGD showed multiple non-bleeding gastric and duodenal ulcers with negative H. Pylori on biopsy. Colonoscopy was normal with multiple polyps biopsied. GI recommended pantoprazole 40 mg BID for 8 weeks and reduction to 40 mg daily after for maintenance.  Follow-up EGD recommended in 8 to 10 weeks to check on ulcers.  ESRD Patient on scheduled dialysis TThSa. Nephrology consulted. Patient on normal dialysis schedule while in hospital.  Prolonged QT interval EKG on admission showed QTc of 540. Patient asymptomatic through admission. Repeat EKG demonstrate EKG of 503. Hydroxychloroquine was continued.   Discharge Diagnoses/Problem List:  Principal Problem for Admission: Gastrointestinal hemorrhage Other Problems addressed during stay:  - ESRD - Hypotension   Disposition: Home  Discharge Condition: Stable  Discharge Exam:  Vitals:   12/25/21 0300 12/25/21 0330  BP:  108/60 98/67  Pulse: 81 81  Resp: 18 17  Temp:  97.9 F (36.6 C)  SpO2: 99% 100%   Physical Exam: General: Ill-appearing, no acute distress, weak after dialysis this morning.  Cardiovascular: Regular rate and rhythm, no murmurs appreciated Respiratory: Clear, no wheezing, no crackles, no consolidations Abdomen: Soft nontender Extremities: No peripheral edema, generalized muscle weakness after dialysis.     Issues for Follow Up:  Incidentally found to have a 1.1 cm right adrenal nodule on CTA.  Recommended to have dedicated CT or MRI imaging with adrenal protocol. Repeat EGD in 8 to 10 weeks. Given her soft blood pressures, her amlodipine and hydralazine were held at discharge.  Recheck blood pressure at follow-up and add back as appropriate.  Significant Procedures:  EGD:  - Normal esophagus - Nonbleeding gastric ulcer, 10 mm large - Multiple small nonbleeding superficial duodenal ulcers - 4 to 5 mm ulcer in duodenal bulb - Recommendation: pantoprazole 40 mg twice daily for 8 weeks, then reduce to 40 mg daily   Colonoscopy: - Normal perianal and rectal exam - 4 mm polyp found in the rectum  Significant Labs and Imaging:  Recent Labs  Lab 12/23/21 0824 12/24/21 0502  WBC 5.7 6.7  HGB 12.3 11.5*  HCT 39.3 36.2  PLT 194 160   Recent Labs  Lab 12/23/21 0824 12/24/21 0502  NA 138 136  K 3.7 3.6  CL 102 97*  CO2 21* 23  GLUCOSE 87 97  BUN 26* 42*  CREATININE 8.85* 11.03*  CALCIUM 9.3 8.9    CTA:  No evidence of acute hemorrhage or active hemorrhage. Aortic atherosclerosis with no evidence of aneurysm. Atherosclerotic calcification at renal ostia bilaterally with moderate gnosis on the right  and severe stenosis on the left. High-grade stenosis/possible occlusion involving the proximal internal iliac artery on the left with reconstitution.  CT: No evidence of bowel obstruction, hemorrhage, acute inflammatory changes. Mild gastric wall thickening, possible  gastritis Cholelithiasis  Right adrenal nodule measuring 1.1 cm with enhancement on portal venous vein phase.  Dedicated CT or MRI imaging with adrenal protocol is recommended.   Results/Tests Pending at Time of Discharge: None  Discharge Medications:  Allergies as of 12/25/2021   No Known Allergies      Medication List     STOP taking these medications    amLODipine 10 MG tablet Commonly known as: NORVASC   hydrALAZINE 50 MG tablet Commonly known as: APRESOLINE   ondansetron 4 MG tablet Commonly known as: ZOFRAN   sucralfate 1 g tablet Commonly known as: Carafate       TAKE these medications    acetaminophen 325 MG tablet Commonly known as: TYLENOL Take 650 mg by mouth every 6 (six) hours as needed for pain.   carvedilol 3.125 MG tablet Commonly known as: COREG Take 1 tablet (3.125 mg total) by mouth 2 (two) times daily with a meal. What changed: how much to take   hydroxychloroquine 200 MG tablet Commonly known as: PLAQUENIL Take 200 mg by mouth daily.   multivitamin Tabs tablet Take 1 tablet by mouth daily.   nicotine 14 mg/24hr patch Commonly known as: NICODERM CQ - dosed in mg/24 hours Place 1 patch (14 mg total) onto the skin daily.   pantoprazole 40 MG tablet Commonly known as: PROTONIX Take 1 tablet (40 mg total) by mouth 2 (two) times daily for 8 weeks.   pantoprazole 40 MG tablet Commonly known as: Protonix Take 1 tablet (40 mg total) by mouth daily. Start taking on: February 18, 2022   sevelamer carbonate 800 MG tablet Commonly known as: RENVELA Take 800-2,400 mg by mouth See admin instructions. 2400 mg with meals 800 mg with snacks        Discharge Instructions: Please refer to Patient Instructions section of EMR for full details.  Patient was counseled important signs and symptoms that should prompt return to medical care, changes in medications, dietary instructions, activity restrictions, and follow up appointments.   Follow-Up  Appointments:   Darci Current, DO 12/25/2021, 1:16 PM PGY-1, [place]   I have evaluated this patient along with Dr. Sabra Heck and reviewed the above note, making necessary revisions.  Pearla Dubonnet, MD 12/25/2021, 1:07 PM PGY-2, [place]

## 2021-12-25 NOTE — Progress Notes (Signed)
  [facility] Progress Note    Assessment/ Plan:   ESRD:  -outpatient HD orders: [name]. 3hrs 32mn. F160. Flow rates: 350/autoflow 1.5. 2k/2cal. UF profile 2. AVG. Meds: sensipar '60mg'$  qtreatment. Heparin 3600 units bolus. Mircera 749m q2weeks (supposed to start 8/15) -HD on TTS schedule, no heparin for now.  -renally dose meds   Hypotension Volume/ h/o hypertension: -low BP likely related to bleed + vomiting/poor po intake on admit. Home anti-HTN on hold, BP now stable, more so on the hypertensive side -will need to readjust EDW post d/c (EDW was being lowered as an outpatient) -UF as tolerated   BRBPR, Anemia of Chronic Kidney Disease: Hemoglobin 11.5, has not received esa's yet as an outpatient. Hgb currently at goal, will hold off on starting ESAs for now. -transfuse prn for hgb < 7 -GI consulted, EGD/scope 8/16: no active bleed seen   Secondary Hyperparathyroidism/Hyperphosphatemia: Renvela and sensipar resumed    Subjective:   Patient seen and examined in room. No acute events. HD overnight. Tolerated HD. Net UF 1.1L. Understandably tired this morning. Has some leg weakness/tingling-chronic issue. Possible d/c today.   Objective:   BP 119/82 (BP Location: Right Arm)   Pulse 81   Temp 98.7 F (37.1 C) (Oral)   Resp 14   Ht '5\' 4"'$  (1.626 m)   Wt 44.6 kg   SpO2 100%   BMI 16.88 kg/m   Intake/Output Summary (Last 24 hours) at 12/25/2021 0854 Last data filed at 12/25/2021 0345 Gross per 24 hour  Intake 200 ml  Output 1100 ml  Net -900 ml   Weight change: -0.8 kg  Physical Exam: Gen:NAD CVS:RRR Resp:normal WOB AbOFB:PZWCnt/ND Ext:no sig edema Neuro: awake, alert Dialysis access: LUE AVG +b/t  Imaging: No results found.  Labs: BMET Recent Labs  Lab 12/19/21 2218 12/21/21 1834 12/22/21 1430 12/23/21 0824 12/24/21 0502  NA 135 136 132* 138 136  K 3.9 3.7 3.2* 3.7 3.6  CL 91* 94* 89* 102 97*  CO2 30 21* 23 21* 23  GLUCOSE 111* 90 139*  87 97  BUN 13 44* 55* 26* 42*  CREATININE 6.06* 11.08* 12.81* 8.85* 11.03*  CALCIUM 9.4 9.6 8.9 9.3 8.9  PHOS  --   --  8.2*  --   --    CBC Recent Labs  Lab 12/21/21 1834 12/22/21 0319 12/23/21 0824 12/24/21 0502  WBC 8.3 7.4 5.7 6.7  NEUTROABS 5.4  --   --   --   HGB 13.2 11.9* 12.3 11.5*  HCT 42.5 37.1 39.3 36.2  MCV 92.0 89.8 91.0 89.2  PLT 166 161 194 160    Medications:     acetaminophen  1,000 mg Oral BID   carvedilol  6.25 mg Oral BID WC   Chlorhexidine Gluconate Cloth  6 each Topical Q0600   cinacalcet  60 mg Oral Q T,Th,Sa-HD   hydroxychloroquine  200 mg Oral Daily   multivitamin  1 tablet Oral QHS   nicotine  14 mg Transdermal Daily   pantoprazole  40 mg Oral BID   sevelamer carbonate  2,400 mg Oral TID WC      ViGean QuintMD [facility] 12/25/2021, 8:54 AM

## 2021-12-25 NOTE — Assessment & Plan Note (Signed)
3.2 on admission.  - replaced with Klor-Con mEq PO BID on 8/16  - stable after repletion

## 2021-12-25 NOTE — Plan of Care (Signed)
  Problem: Acute Rehab PT Goals(only PT should resolve) Goal: Pt Will Go Supine/Side To Sit Outcome: Adequate for Discharge Goal: Patient Will Transfer Sit To/From Stand Outcome: Adequate for Discharge Goal: Pt Will Ambulate Outcome: Adequate for Discharge   Problem: Education: Goal: Knowledge of General Education information will improve Description: Including pain rating scale, medication(s)/side effects and non-pharmacologic comfort measures Outcome: Adequate for Discharge   Problem: Health Behavior/Discharge Planning: Goal: Ability to manage health-related needs will improve Outcome: Adequate for Discharge   Problem: Clinical Measurements: Goal: Ability to maintain clinical measurements within normal limits will improve Outcome: Adequate for Discharge Goal: Will remain free from infection Outcome: Adequate for Discharge Goal: Diagnostic test results will improve Outcome: Adequate for Discharge Goal: Respiratory complications will improve Outcome: Adequate for Discharge Goal: Cardiovascular complication will be avoided Outcome: Adequate for Discharge   Problem: Activity: Goal: Risk for activity intolerance will decrease Outcome: Adequate for Discharge   Problem: Nutrition: Goal: Adequate nutrition will be maintained Outcome: Adequate for Discharge   Problem: Coping: Goal: Level of anxiety will decrease Outcome: Adequate for Discharge   Problem: Elimination: Goal: Will not experience complications related to bowel motility Outcome: Adequate for Discharge Goal: Will not experience complications related to urinary retention Outcome: Adequate for Discharge   Problem: Pain Managment: Goal: General experience of comfort will improve Outcome: Adequate for Discharge   Problem: Safety: Goal: Ability to remain free from injury will improve Outcome: Adequate for Discharge   Problem: Skin Integrity: Goal: Risk for impaired skin integrity will decrease Outcome: Adequate  for Discharge

## 2021-12-26 ENCOUNTER — Telehealth: Payer: Self-pay | Admitting: Nephrology

## 2021-12-26 ENCOUNTER — Encounter (HOSPITAL_COMMUNITY): Payer: Self-pay | Admitting: Gastroenterology

## 2021-12-26 NOTE — Telephone Encounter (Signed)
Transition of Care Contact from [facility]  Date of Discharge: 12/25/21 Date of Contact: 12/26/21 Method of contact: phone - attempted  Attempted to contact patient to discuss transition of care from inpatient admission.  Patient did not answer the phone.  Will attempt to call again or follow up at dialysis.  Jen Mow, PA-C [facility] Pager: [phone]

## 2021-12-29 ENCOUNTER — Encounter: Payer: Self-pay | Admitting: Gastroenterology

## 2022-01-26 ENCOUNTER — Telehealth: Payer: Self-pay

## 2022-01-26 NOTE — Telephone Encounter (Signed)
-----   Message from Carl Best, RN sent at 12/29/2021 12:39 PM EDT ----- Patient will need to be scheduled once out.  Thornton Park, MD  Carl Best, RN EGD in 8 to 10 weeks. Colonoscopy in 10 years.

## 2022-02-04 NOTE — Telephone Encounter (Signed)
Unable to reach patient, vm box full. 

## 2022-02-05 NOTE — Telephone Encounter (Signed)
Unable to reach patient, vm box full. 

## 2022-02-08 NOTE — Telephone Encounter (Signed)
Unable to reach patient x 3, vm box full. Also tried to reach alternate contact that was listed, however their vm box was full as well. Letter sent home to patient to contact our office for scheduling.

## 2022-10-27 ENCOUNTER — Other Ambulatory Visit: Payer: Self-pay

## 2022-10-27 ENCOUNTER — Emergency Department (HOSPITAL_COMMUNITY): Payer: Medicare HMO

## 2022-10-27 ENCOUNTER — Inpatient Hospital Stay (HOSPITAL_COMMUNITY)
Admission: EM | Admit: 2022-10-27 | Discharge: 2022-10-30 | DRG: 371 | Disposition: A | Discharge status: Home / Self Care | Payer: Medicare HMO | Attending: Family Medicine | Admitting: Family Medicine

## 2022-10-27 ENCOUNTER — Encounter (HOSPITAL_COMMUNITY): Payer: Self-pay

## 2022-10-27 DIAGNOSIS — M329 Systemic lupus erythematosus, unspecified: Secondary | ICD-10-CM | POA: Diagnosis present

## 2022-10-27 DIAGNOSIS — R1084 Generalized abdominal pain: Secondary | ICD-10-CM

## 2022-10-27 DIAGNOSIS — K254 Chronic or unspecified gastric ulcer with hemorrhage: Secondary | ICD-10-CM | POA: Diagnosis present

## 2022-10-27 DIAGNOSIS — G629 Polyneuropathy, unspecified: Secondary | ICD-10-CM | POA: Diagnosis present

## 2022-10-27 DIAGNOSIS — F17210 Nicotine dependence, cigarettes, uncomplicated: Secondary | ICD-10-CM | POA: Diagnosis present

## 2022-10-27 DIAGNOSIS — Z8674 Personal history of sudden cardiac arrest: Secondary | ICD-10-CM

## 2022-10-27 DIAGNOSIS — Z992 Dependence on renal dialysis: Secondary | ICD-10-CM

## 2022-10-27 DIAGNOSIS — K921 Melena: Principal | ICD-10-CM

## 2022-10-27 DIAGNOSIS — K621 Rectal polyp: Secondary | ICD-10-CM | POA: Diagnosis present

## 2022-10-27 DIAGNOSIS — Z808 Family history of malignant neoplasm of other organs or systems: Secondary | ICD-10-CM

## 2022-10-27 DIAGNOSIS — Z8673 Personal history of transient ischemic attack (TIA), and cerebral infarction without residual deficits: Secondary | ICD-10-CM

## 2022-10-27 DIAGNOSIS — I132 Hypertensive heart and chronic kidney disease with heart failure and with stage 5 chronic kidney disease, or end stage renal disease: Secondary | ICD-10-CM | POA: Diagnosis present

## 2022-10-27 DIAGNOSIS — K922 Gastrointestinal hemorrhage, unspecified: Secondary | ICD-10-CM | POA: Diagnosis not present

## 2022-10-27 DIAGNOSIS — Z803 Family history of malignant neoplasm of breast: Secondary | ICD-10-CM

## 2022-10-27 DIAGNOSIS — E871 Hypo-osmolality and hyponatremia: Secondary | ICD-10-CM | POA: Diagnosis not present

## 2022-10-27 DIAGNOSIS — Z79899 Other long term (current) drug therapy: Secondary | ICD-10-CM

## 2022-10-27 DIAGNOSIS — E872 Acidosis, unspecified: Secondary | ICD-10-CM | POA: Diagnosis present

## 2022-10-27 DIAGNOSIS — E876 Hypokalemia: Secondary | ICD-10-CM | POA: Diagnosis not present

## 2022-10-27 DIAGNOSIS — I5032 Chronic diastolic (congestive) heart failure: Secondary | ICD-10-CM | POA: Diagnosis present

## 2022-10-27 DIAGNOSIS — J449 Chronic obstructive pulmonary disease, unspecified: Secondary | ICD-10-CM | POA: Diagnosis present

## 2022-10-27 DIAGNOSIS — G40909 Epilepsy, unspecified, not intractable, without status epilepticus: Secondary | ICD-10-CM | POA: Diagnosis present

## 2022-10-27 DIAGNOSIS — G43909 Migraine, unspecified, not intractable, without status migrainosus: Secondary | ICD-10-CM | POA: Diagnosis present

## 2022-10-27 DIAGNOSIS — D631 Anemia in chronic kidney disease: Secondary | ICD-10-CM | POA: Diagnosis present

## 2022-10-27 DIAGNOSIS — N2581 Secondary hyperparathyroidism of renal origin: Secondary | ICD-10-CM | POA: Diagnosis present

## 2022-10-27 DIAGNOSIS — I776 Arteritis, unspecified: Secondary | ICD-10-CM | POA: Diagnosis present

## 2022-10-27 DIAGNOSIS — Z8041 Family history of malignant neoplasm of ovary: Secondary | ICD-10-CM

## 2022-10-27 DIAGNOSIS — I959 Hypotension, unspecified: Secondary | ICD-10-CM | POA: Diagnosis present

## 2022-10-27 DIAGNOSIS — K264 Chronic or unspecified duodenal ulcer with hemorrhage: Secondary | ICD-10-CM | POA: Diagnosis present

## 2022-10-27 DIAGNOSIS — M1990 Unspecified osteoarthritis, unspecified site: Secondary | ICD-10-CM | POA: Diagnosis present

## 2022-10-27 DIAGNOSIS — N186 End stage renal disease: Secondary | ICD-10-CM | POA: Diagnosis present

## 2022-10-27 DIAGNOSIS — D62 Acute posthemorrhagic anemia: Secondary | ICD-10-CM | POA: Diagnosis present

## 2022-10-27 DIAGNOSIS — A0472 Enterocolitis due to Clostridium difficile, not specified as recurrent: Principal | ICD-10-CM | POA: Diagnosis present

## 2022-10-27 LAB — CBC WITH DIFFERENTIAL/PLATELET
Abs Immature Granulocytes: 0.02 10*3/uL (ref 0.00–0.07)
Basophils Absolute: 0.1 10*3/uL (ref 0.0–0.1)
Basophils Relative: 1 %
Eosinophils Absolute: 0.2 10*3/uL (ref 0.0–0.5)
Eosinophils Relative: 2 %
HCT: 47.6 % — ABNORMAL HIGH (ref 36.0–46.0)
Hemoglobin: 14.5 g/dL (ref 12.0–15.0)
Immature Granulocytes: 0 %
Lymphocytes Relative: 21 %
Lymphs Abs: 2 10*3/uL (ref 0.7–4.0)
MCH: 31 pg (ref 26.0–34.0)
MCHC: 30.5 g/dL (ref 30.0–36.0)
MCV: 101.7 fL — ABNORMAL HIGH (ref 80.0–100.0)
Monocytes Absolute: 0.9 10*3/uL (ref 0.1–1.0)
Monocytes Relative: 9 %
Neutro Abs: 6.2 10*3/uL (ref 1.7–7.7)
Neutrophils Relative %: 67 %
Platelets: 233 10*3/uL (ref 150–400)
RBC: 4.68 MIL/uL (ref 3.87–5.11)
RDW: 16.8 % — ABNORMAL HIGH (ref 11.5–15.5)
WBC: 9.4 10*3/uL (ref 4.0–10.5)
nRBC: 1.1 % — ABNORMAL HIGH (ref 0.0–0.2)

## 2022-10-27 LAB — LACTIC ACID, PLASMA
Lactic Acid, Venous: 3.9 mmol/L (ref 0.5–1.9)
Lactic Acid, Venous: 2.5 mmol/L (ref 0.5–1.9)

## 2022-10-27 LAB — I-STAT CHEM 8, ED
BUN: 20 mg/dL (ref 8–23)
Calcium, Ion: 1.18 mmol/L (ref 1.15–1.40)
Chloride: 97 mmol/L — ABNORMAL LOW (ref 98–111)
Creatinine, Ser: 9.4 mg/dL — ABNORMAL HIGH (ref 0.44–1.00)
Glucose, Bld: 104 mg/dL — ABNORMAL HIGH (ref 70–99)
HCT: 50 % — ABNORMAL HIGH (ref 36.0–46.0)
Hemoglobin: 17 g/dL — ABNORMAL HIGH (ref 12.0–15.0)
Potassium: 4.2 mmol/L (ref 3.5–5.1)
Sodium: 141 mmol/L (ref 135–145)
TCO2: 33 mmol/L — ABNORMAL HIGH (ref 22–32)

## 2022-10-27 LAB — MAGNESIUM: Magnesium: 2.8 mg/dL — ABNORMAL HIGH (ref 1.7–2.4)

## 2022-10-27 LAB — LIPASE, BLOOD: Lipase: 46 U/L (ref 11–51)

## 2022-10-27 LAB — COMPREHENSIVE METABOLIC PANEL
ALT: 14 U/L (ref 0–44)
AST: 33 U/L (ref 15–41)
Albumin: 4.4 g/dL (ref 3.5–5.0)
Alkaline Phosphatase: 83 U/L (ref 38–126)
Anion gap: 19 — ABNORMAL HIGH (ref 5–15)
BUN: 18 mg/dL (ref 8–23)
CO2: 29 mmol/L (ref 22–32)
Calcium: 10.5 mg/dL — ABNORMAL HIGH (ref 8.9–10.3)
Chloride: 91 mmol/L — ABNORMAL LOW (ref 98–111)
Creatinine, Ser: 9.89 mg/dL — ABNORMAL HIGH (ref 0.44–1.00)
GFR, Estimated: 4 mL/min — ABNORMAL LOW (ref >60)
Glucose, Bld: 105 mg/dL — ABNORMAL HIGH (ref 70–99)
Potassium: 4.2 mmol/L (ref 3.5–5.1)
Sodium: 139 mmol/L (ref 135–145)
Total Bilirubin: 0.5 mg/dL (ref 0.3–1.2)
Total Protein: 9.9 g/dL — ABNORMAL HIGH (ref 6.5–8.1)

## 2022-10-27 LAB — PROTIME-INR
INR: 1.1 (ref 0.8–1.2)
Prothrombin Time: 13.9 seconds (ref 11.4–15.2)

## 2022-10-27 LAB — TYPE AND SCREEN
ABO/RH(D): B POS
Antibody Screen: NEGATIVE

## 2022-10-27 LAB — TROPONIN I (HIGH SENSITIVITY)
Troponin I (High Sensitivity): 23 ng/L — ABNORMAL HIGH (ref <18)
Troponin I (High Sensitivity): 21 ng/L — ABNORMAL HIGH (ref <18)

## 2022-10-27 MED ORDER — IOHEXOL 350 MG/ML SOLN
75.0000 mL | Freq: Once | INTRAVENOUS | Status: AC | PRN
  Administered 2022-10-27: 75 mL via INTRAVENOUS

## 2022-10-27 MED ORDER — FENTANYL CITRATE PF 50 MCG/ML IJ SOSY
50.0000 ug | PREFILLED_SYRINGE | Freq: Once | INTRAMUSCULAR | Status: AC
  Administered 2022-10-27: 50 ug via INTRAVENOUS
  Filled 2022-10-27: qty 1

## 2022-10-27 MED ORDER — SODIUM CHLORIDE 0.9 % IV BOLUS
500.0000 mL | Freq: Once | INTRAVENOUS | Status: AC
  Administered 2022-10-27: 500 mL via INTRAVENOUS

## 2022-10-27 MED ORDER — LACTATED RINGERS IV BOLUS
500.0000 mL | Freq: Once | INTRAVENOUS | Status: AC
  Administered 2022-10-27: 500 mL via INTRAVENOUS

## 2022-10-27 MED ORDER — SODIUM CHLORIDE 0.9% FLUSH
3.0000 mL | Freq: Two times a day (BID) | INTRAVENOUS | Status: DC
  Administered 2022-10-28 – 2022-10-30 (×5): 3 mL via INTRAVENOUS

## 2022-10-27 MED ORDER — MIDODRINE HCL 5 MG PO TABS
5.0000 mg | ORAL_TABLET | Freq: Once | ORAL | Status: AC
  Administered 2022-10-27: 5 mg via ORAL
  Filled 2022-10-27: qty 1

## 2022-10-27 MED ORDER — PANTOPRAZOLE SODIUM 40 MG IV SOLR
40.0000 mg | Freq: Two times a day (BID) | INTRAVENOUS | Status: DC
  Administered 2022-10-27 – 2022-10-30 (×6): 40 mg via INTRAVENOUS
  Filled 2022-10-27 (×6): qty 10

## 2022-10-27 MED ORDER — METOCLOPRAMIDE HCL 5 MG/ML IJ SOLN
10.0000 mg | Freq: Once | INTRAMUSCULAR | Status: AC
  Administered 2022-10-27: 10 mg via INTRAVENOUS
  Filled 2022-10-27: qty 2

## 2022-10-27 MED ORDER — HYDROMORPHONE HCL 1 MG/ML IJ SOLN: 0.5000 mg | Freq: Once | INTRAMUSCULAR | Status: DC

## 2022-10-27 MED ORDER — HEPARIN SODIUM (PORCINE) 5000 UNIT/ML IJ SOLN
5000.0000 [IU] | Freq: Three times a day (TID) | INTRAMUSCULAR | Status: DC
Start: 2022-10-27 — End: 2022-10-27

## 2022-10-27 NOTE — ED Triage Notes (Signed)
PT BIB EMS for rectal bleeding X 5 days, abdominal pain, dialysis yesterday, history of gastric ulcers.    79/57 HR 100 O2 100 CBG 111  Similar event April, patient had to have a blood transfusion.

## 2022-10-27 NOTE — ED Provider Notes (Signed)
[place] EMERGENCY DEPARTMENT AT [facility] Provider Note   CSN: [id number] Arrival date & time: 10/27/22  1423     History  Chief Complaint  Patient presents with   Rectal Bleeding    Nausea    Kathleen [name] is a 62 y.o. female.  HPI Patient presents for abdominal pain, nausea, and rectal bleeding.  Medical history includes HTN, ESRD, SLE, COPD, anemia, CHF, prior rectal bleeding, PUD, seizures, migraine headaches.  Patient underwent dialysis yesterday.  She reports red blood per rectum over the past 5 days.  She notices generalized abdominal pain.  She does have current nausea.  Vital signs with EMS were notable for hypotension.    Home Medications Prior to Admission medications   Medication Sig Start Date End Date Taking? Authorizing Provider  acetaminophen (TYLENOL) 325 MG tablet Take 650 mg by mouth every 6 (six) hours as needed for pain.    [provider]  carvedilol (COREG) 3.125 MG tablet Take 1 tablet (3.125 mg total) by mouth 2 (two) times daily with a meal. Patient taking differently: Take 6.25 mg by mouth 2 (two) times daily with a meal. 06/04/21   Joseph Art, DO  hydroxychloroquine (PLAQUENIL) 200 MG tablet Take 200 mg by mouth daily. 09/06/21   [provider]  multivitamin (RENA-VIT) TABS tablet Take 1 tablet by mouth daily. 08/21/21   [provider]  nicotine (NICODERM CQ - DOSED IN MG/24 HOURS) 14 mg/24hr patch Place 1 patch (14 mg total) onto the skin daily. 12/25/21   Sabino Dick, DO  pantoprazole (PROTONIX) 40 MG tablet Take 1 tablet (40 mg total) by mouth 2 (two) times daily for 8 weeks. 12/24/21 02/17/22  Sabino Dick, DO  pantoprazole (PROTONIX) 40 MG tablet Take 1 tablet (40 mg total) by mouth daily. 02/18/22 02/18/23  Sabino Dick, DO  sevelamer carbonate (RENVELA) 800 MG tablet Take 800-2,400 mg by mouth See admin instructions. 2400 mg with meals 800 mg with snacks 08/28/21   [provider]      Allergies    Patient has no known allergies.    Review of Systems   Review of Systems  Constitutional:  Positive for fatigue.  Gastrointestinal:  Positive for abdominal pain, blood in stool, nausea and vomiting.  Neurological:  Positive for weakness (Generalized).  All other systems reviewed and are negative.   Physical Exam Updated Vital Signs BP 105/83   Pulse 92   Resp 11   Ht 5\' 4"  (1.626 m)   Wt 48.1 kg   BMI 18.19 kg/m  Physical Exam Vitals and nursing note reviewed.  Constitutional:      General: She is not in acute distress.    Appearance: She is well-developed. She is ill-appearing. She is not toxic-appearing or diaphoretic.  HENT:     Head: Normocephalic and atraumatic.     Right Ear: External ear normal.     Left Ear: External ear normal.     Nose: Nose normal.     Mouth/Throat:     Mouth: Mucous membranes are moist.  Eyes:     Extraocular Movements: Extraocular movements intact.     Conjunctiva/sclera: Conjunctivae normal.  Cardiovascular:     Rate and Rhythm: Regular rhythm. Tachycardia present.     Heart sounds: No murmur heard. Pulmonary:     Effort: Pulmonary effort is normal. No respiratory distress.     Breath sounds: Normal breath sounds. No wheezing or rales.  Abdominal:     General:  There is no distension.     Palpations: Abdomen is soft.     Tenderness: There is abdominal tenderness.  Musculoskeletal:        General: No swelling.     Cervical back: Normal range of motion and neck supple.  Skin:    General: Skin is warm and dry.     Coloration: Skin is not jaundiced or pale.  Neurological:     General: No focal deficit present.     Mental Status: She is alert and oriented to person, place, and time.  Psychiatric:        Mood and Affect: Mood normal.        Behavior: Behavior normal.     ED Results / Procedures / Treatments   Labs (all labs ordered are listed, but only abnormal results are displayed) Labs  Reviewed  COMPREHENSIVE METABOLIC PANEL - Abnormal; Notable for the following components:      Result Value   Chloride 91 (*)    Glucose, Bld 105 (*)    Creatinine, Ser 9.89 (*)    Calcium 10.5 (*)    Total Protein 9.9 (*)    GFR, Estimated 4 (*)    Anion gap 19 (*)    All other components within normal limits  CBC WITH DIFFERENTIAL/PLATELET - Abnormal; Notable for the following components:   HCT 47.6 (*)    MCV 101.7 (*)    RDW 16.8 (*)    nRBC 1.1 (*)    All other components within normal limits  LACTIC ACID, PLASMA - Abnormal; Notable for the following components:   Lactic Acid, Venous 3.9 (*)    All other components within normal limits  MAGNESIUM - Abnormal; Notable for the following components:   Magnesium 2.8 (*)    All other components within normal limits  I-STAT CHEM 8, ED - Abnormal; Notable for the following components:   Chloride 97 (*)    Creatinine, Ser 9.40 (*)    Glucose, Bld 104 (*)    TCO2 33 (*)    Hemoglobin 17.0 (*)    HCT 50.0 (*)    All other components within normal limits  TROPONIN I (HIGH SENSITIVITY) - Abnormal; Notable for the following components:   Troponin I (High Sensitivity) 23 (*)    All other components within normal limits  LIPASE, BLOOD  PROTIME-INR  LACTIC ACID, PLASMA  TYPE AND SCREEN  TROPONIN I (HIGH SENSITIVITY)    EKG EKG Interpretation  Date/Time:  Wednesday October 27 2022 14:35:38 EDT Ventricular Rate:  89 PR Interval:  174 QRS Duration: 81 QT Interval:  396 QTC Calculation: 482 R Axis:   39 Text Interpretation: Sinus rhythm Left atrial enlargement Anteroseptal infarct, age indeterminate Confirmed by Gloris Manchester [phone]) on 10/27/2022 3:00:33 PM  Radiology DG Chest Portable 1 View  Result Date: 10/27/2022 CLINICAL DATA:  Abdominal pain, vomiting, rectal bleeding EXAM: PORTABLE CHEST 1 VIEW COMPARISON:  12/20/2021 FINDINGS: Transverse diameter of heart is increased. There are no signs of pulmonary edema or focal pulmonary  consolidation. There is no pleural effusion or pneumothorax. Small linear densities in medial left lower lung field may suggest scarring. There is no pleural effusion or pneumothorax. IMPRESSION: There are no signs of pulmonary edema or focal pulmonary consolidation. Electronically Signed   By: Ernie Avena M.D.   On: 10/27/2022 16:19    Procedures Procedures    Medications Ordered in ED Medications  metoCLOPramide (REGLAN) injection 10 mg (10 mg Intravenous Given 10/27/22 1555)  lactated  ringers bolus 500 mL (0 mLs Intravenous Stopped 10/27/22 1712)  fentaNYL (SUBLIMAZE) injection 50 mcg (50 mcg Intravenous Given 10/27/22 1559)    ED Course/ Medical Decision Making/ A&P                             Medical Decision Making Amount and/or Complexity of Data Reviewed Labs: ordered. Radiology: ordered.  Risk Prescription drug management.   This patient presents to the ED for concern of abdominal pain, rectal bleeding, this involves an extensive number of treatment options, and is a complaint that carries with it a high risk of complications and morbidity.  The differential diagnosis includes hemorrhoidal bleed, diverticular bleed, colitis, mesenteric ischemia, upper GI bleed with rapid transit   Co morbidities that complicate the patient evaluation  HTN, ESRD, SLE, COPD, anemia, CHF, prior rectal bleeding, PUD, seizures, migraine headaches   Additional history obtained:  Additional history obtained from EMS External records from outside source obtained and reviewed including EMR   Lab Tests:  I Ordered, and personally interpreted labs.  The pertinent results include: Normal hemoglobin, no leukocytosis, baseline elevated creatinine, new mild hypercalcemia, elevated lactic acid, normal troponin   Imaging Studies ordered:  I ordered imaging studies including chest x-ray, CTA of abdomen and pelvis I independently visualized and interpreted imaging which showed no acute  findings on chest x-ray, CT scan pending at time of signout I agree with the radiologist interpretation   Cardiac Monitoring: / EKG:  The patient was maintained on a cardiac monitor.  I personally viewed and interpreted the cardiac monitored which showed an underlying rhythm of: Sinus rhythm   Consultations Obtained:  I requested consultation with the gastroenterology,  and discussed lab and imaging findings as well as pertinent plan - they recommend: Will see in consult   Problem List / ED Course / Critical interventions / Medication management  Patient presents for rectal bleeding for the past 5 days.  She also endorses ongoing and worsening generalized abdominal pain.  She has had nausea and vomiting today.  EMS reports hypotension during transit.  No interventions were given prior to arrival.  On arrival, patient has soft blood pressures.  Per chart review, it does appear that this is her baseline.  She appears uncomfortable and does have generalized tenderness on exam.  Given her soft blood pressures in the setting of recent p.o. intolerance, IV fluids were ordered for hydration.  Lab work was ordered to assess for possible symptomatic anemia given the ongoing hematochezia.  Fentanyl and Reglan were given for symptomatic relief.  Following IV fluids, patient did have improvement in her blood pressures.  Surprisingly, hemoglobin is elevated above baseline.  No leukocytosis is present on lab work.  There is a lactic acidosis, raising concern for possible intra-abdominal ischemia.  CTA of abdomen and pelvis was ordered.  This study was pending at time of signout.  Care of patient was signed out to oncoming ED provider. I ordered medication including IV fluids for hydration; fentanyl for analgesia; Reglan for nausea Reevaluation of the patient after these medicines showed that the patient improved I have reviewed the patients home medicines and have made adjustments as needed   Social  Determinants of Health:  Has PCP        Final Clinical Impression(s) / ED Diagnoses Final diagnoses:  Hematochezia  Generalized abdominal pain    Rx / DC Orders ED Discharge Orders     None  Gloris Manchester, MD 10/27/22 1754

## 2022-10-27 NOTE — ED Provider Notes (Signed)
Pt signed out by Dr. Durwin Nora pending CTA.  Pt was d/w Dr. Leonides Schanz (LB).  They plan to see pt in am as she has stabilized.  CTA: VASCULAR    Atherosclerotic calcifications are noted. Focal narrowing in the  proximal right external iliac artery is noted.    No aneurysmal dilatation or dissection is seen.    The renal arteries are diminutive bilaterally with associated renal  atrophy.    No other vascular abnormality is seen.    NON-VASCULAR    Cholelithiasis without complicating factors.    No findings to suggest active GI hemorrhage.    Pt's BP back to the mid-80s, so she was given another 500 cc NS bolus and bp did improve.  Pt d/w FP for admission.   Jacalyn Lefevre, MD 10/27/22 2101

## 2022-10-27 NOTE — Assessment & Plan Note (Addendum)
Lower GI bleed, has happened previously 12/2021. CTA showed no obvious source of GI bleed. GI to see in AM, will have NPO in anticipation for repeat scope. Hgb at baseline, 14.5 and evidence of hypotension on arrival (cautious with fluid resuscitation 2/2 ESRD)--will maintain MAP.  -- Admit to [facility] progressive, Dr. Manson Passey as attending - N.p.o. at midnight in anticipation for possible scope - GI to see in the a.m. - AM BMP/CBC - PPI - Hold off on VTE prophylaxis - PT/OT

## 2022-10-27 NOTE — H&P (Cosign Needed Addendum)
Hospital Admission History and Physical Service Pager: [phone]  Patient name: Kathleen [name] Medical record number: [id number] Date of Birth: Feb 19, 1961 Age: 62 y.o. Gender: female  Primary Care Provider: Hildred Priest, NP Consultants: GI Code Status: FULL Preferred Emergency Contact:  Contact Information     Name Relation Home Work Mobile   Corley Significant other   [phone]   bethea,glenn Brother   [phone]       Chief Complaint: Hematochezia   Assessment and Plan: Kathleen [name] is a 62 y.o. female presenting with hematochezia. Differential for presentation of this includes hemorrhoids/anal fissures are less likely, diverticulosis, IBD, angiodysplasias, infectious or ischemic colitis-neither of which appear likely given presentation BUT there was evidence of altered vascular, congested and erythematous mucosa in the rectum, in the sigmoid colon, descending colon, and splenic flexure from last colonoscopy 12/2021 that could be indicators of colitis//was found to have chronic peptic duodenitis at that time.    (Principal) Acute GI bleeding  Lower GI bleed, has happened previously 12/2021. CTA showed no obvious source of GI bleed. GI to see in AM, will have NPO in anticipation for repeat scope. Hgb at baseline, 14.5 and evidence of hypotension on arrival (cautious with fluid resuscitation 2/2 ESRD)--will maintain MAP.  -- Admit to FPTS progressive, Dr. Manson Passey as attending - N.p.o. at midnight in anticipation for possible scope - GI to see in the a.m. - AM BMP/CBC - PPI - Hold off on VTE prophylaxis - PT/OT      Lactic acidosis  Possibly due to hypoperfusion given acute blood loss, although hgb does not display that and evidenced by persistent hypotension. Will continue to trend until normalized.  -Trend LA   Chronic and Stable  Lupus --Continue home hydroxychloroquine, not documented as a home medication but was last discharged on this medication.  Discuss with pharmacy HTN-- Hold hydral, amlodipine, coreg for now in setting of hypotension ESRD on HD-- contact nephro to continue dialysis inpatient, no uremia on admission  COPD -- monitor     FEN/GI: NPO @ [place] VTE Prophylaxis: SCDs  Disposition: progressive   History of Present Illness:  Kathleen [name] is a 62 y.o. female presenting with rectal bleeding onset approx 1 week ago. She notes of no abdominal pain, fever, chills but does report of vomiting, non-bloody. Has had this happen in the past in 12/2021, but she reports that this time is a lot less severe.  No complaints of dyspnea, chest pain, swelling in the extremities. Has dialysis T/R/Sat and did not miss yesterdays session.   Felt like her "stomach was flipping" yesterday which prompted her to come to the ED   In the ED, received a dose of Dilaudid, Reglan, LR 1 L total bolus, normal saline 500 mL total bolus, fentanyl. CTA noted as above. Trop 23>21.  Review Of Systems: Per HPI with the following additions: See above   Pertinent Past Medical History: HTN Rectal bleeding  ESRD on HD Lupus  COPD  Anemia of ESRD  Neuropathy  Secondary hyperparathyroidism  TIA  Diastolic heart failure   Remainder reviewed in history tab.   Pertinent Past Surgical History: Colonoscopy biopsy   Remainder reviewed in history tab.  Pertinent Social History: Tobacco use: Yes-- 1/3 PPD  Alcohol use: No Other Substance use: THC, otherwise no  Lives with significant other   Pertinent Family History: None   Remainder reviewed in history tab.   Important Outpatient Medications: Hydroxychloroquine 200 mg tab for lupus  Amlodipine 10 mg daily  Carvedilol 3.125 BID Hydral 50 tab q12 Renvela 800 daily  Remainder reviewed in medication history.   Objective: BP 92/68   Pulse 92   Temp 98 F (36.7 C)   Resp 14   Ht 5\' 4"  (1.626 m)   Wt 48.1 kg   SpO2 99%   BMI 18.19 kg/m  Exam: General: NAD, easily arousable,  answering questions appropriately Eyes: EOMI ENTM: Normal nares, normal external ears Neck: No LAD Cardiovascular: RRR, no m/g/r Respiratory: CTAB Gastrointestinal: NTTP, nondistended/soft  MSK: Able to move all extremities  Derm: No rashes  Neuro: A&Ox3 Psych: Normal affect and mood   Labs:  CBC BMET  Recent Labs  Lab 10/27/22 1445 10/27/22 1523  WBC 9.4  --   HGB 14.5 17.0*  HCT 47.6* 50.0*  PLT 233  --    Recent Labs  Lab 10/27/22 1445 10/27/22 1523  NA 139 141  K 4.2 4.2  CL 91* 97*  CO2 29  --   BUN 18 20  CREATININE 9.89* 9.40*  GLUCOSE 105* 104*  CALCIUM 10.5*  --      EKG:   Nonspecific ST changes, NSR, same as previous   Imaging Studies Performed:  CT Angio Abd/Pel W and/or Wo Contrast  Result Date: 10/27/2022 IMPRESSION: VASCULAR Atherosclerotic calcifications are noted. Focal narrowing in the proximal right external iliac artery is noted. No aneurysmal dilatation or dissection is seen. The renal arteries are diminutive bilaterally with associated renal atrophy. No other vascular abnormality is seen. NON-VASCULAR Cholelithiasis without complicating factors. No findings to suggest active GI hemorrhage.   DG Chest Portable 1 View  Result Date: 10/27/2022 IMPRESSION: There are no signs of pulmonary edema or focal pulmonary consolidation.   Negative overall    Alfredo Martinez, MD 10/27/2022, 9:38 PM PGY-2, [facility]  FPTS Intern pager: [phone], text pages welcome Secure chat group [facility] Teaching Service

## 2022-10-27 NOTE — Assessment & Plan Note (Addendum)
Possibly due to hypoperfusion given acute blood loss, although hgb does not display that. Will continue to trend until normalized.  -Trend LA

## 2022-10-27 NOTE — Hospital Course (Addendum)
Kathleen [name] is a 62 y.o.female with a history of HTN, rectal bleeding, ESRD on HD, lupus, COPD, anemia of ESRD, TIA, diastolic heart failure who was admitted to the [facility] Teaching Service at [facility] for rectal bleeding. Her hospital course is detailed below:  Rectal Bleeding Patient p/f rectal bleeding with normal Hgb but soft pressures. CTA abdomen unremarkable, CT chest also unremarkable for source of bleed. GI was consulted and recommended GI stool studies. Required no pRBCs, maintained Hgb throughout stay with discharge hgb of 11.9. Was given midodrine for soft pressures and improved with dialysis and gentle fluid resuscitation as needed. Continued with PPI as well during admission, patient remained stable and was able to tolerate PO. She was Cdiff antigen positive, toxin negative, and PCR positive while symptomatic. Decided to treat with Fidaxomicin BID x10 days. Had improvement of symptoms with treatment. Hgb stable at 10.2 on discharge.  ESRD on HD  Continued regular HD sessions inpatient.  Other chronic conditions were medically managed with home medications and formulary alternatives as necessary (ESRD on HD, HTN, lupus, COPD)  PCP Follow-up Recommendations: Ensure completion of fidaxomicin (last dose 11/08/2022) Follow up with GI Continue protonix BID  CBC on PCP follow up  Continue regular nephro HD sessions outpatient

## 2022-10-27 NOTE — ED Notes (Signed)
Patient's BP running low, messaged provider, got orders very promptly, pressures normalizing. Patient now on bedpan.

## 2022-10-28 ENCOUNTER — Encounter (HOSPITAL_COMMUNITY): Payer: Self-pay | Admitting: Family Medicine

## 2022-10-28 DIAGNOSIS — I959 Hypotension, unspecified: Secondary | ICD-10-CM | POA: Diagnosis present

## 2022-10-28 DIAGNOSIS — E872 Acidosis, unspecified: Secondary | ICD-10-CM | POA: Diagnosis present

## 2022-10-28 DIAGNOSIS — Z8041 Family history of malignant neoplasm of ovary: Secondary | ICD-10-CM | POA: Diagnosis not present

## 2022-10-28 DIAGNOSIS — D62 Acute posthemorrhagic anemia: Secondary | ICD-10-CM | POA: Diagnosis present

## 2022-10-28 DIAGNOSIS — M329 Systemic lupus erythematosus, unspecified: Secondary | ICD-10-CM | POA: Diagnosis present

## 2022-10-28 DIAGNOSIS — Z79899 Other long term (current) drug therapy: Secondary | ICD-10-CM | POA: Diagnosis not present

## 2022-10-28 DIAGNOSIS — Z992 Dependence on renal dialysis: Secondary | ICD-10-CM | POA: Diagnosis not present

## 2022-10-28 DIAGNOSIS — I5032 Chronic diastolic (congestive) heart failure: Secondary | ICD-10-CM | POA: Diagnosis present

## 2022-10-28 DIAGNOSIS — J449 Chronic obstructive pulmonary disease, unspecified: Secondary | ICD-10-CM | POA: Diagnosis present

## 2022-10-28 DIAGNOSIS — E871 Hypo-osmolality and hyponatremia: Secondary | ICD-10-CM | POA: Diagnosis not present

## 2022-10-28 DIAGNOSIS — E876 Hypokalemia: Secondary | ICD-10-CM | POA: Diagnosis not present

## 2022-10-28 DIAGNOSIS — A0472 Enterocolitis due to Clostridium difficile, not specified as recurrent: Secondary | ICD-10-CM | POA: Diagnosis present

## 2022-10-28 DIAGNOSIS — N186 End stage renal disease: Secondary | ICD-10-CM

## 2022-10-28 DIAGNOSIS — K922 Gastrointestinal hemorrhage, unspecified: Secondary | ICD-10-CM | POA: Diagnosis present

## 2022-10-28 DIAGNOSIS — K921 Melena: Secondary | ICD-10-CM | POA: Diagnosis not present

## 2022-10-28 DIAGNOSIS — K264 Chronic or unspecified duodenal ulcer with hemorrhage: Secondary | ICD-10-CM | POA: Diagnosis present

## 2022-10-28 DIAGNOSIS — K254 Chronic or unspecified gastric ulcer with hemorrhage: Secondary | ICD-10-CM | POA: Diagnosis present

## 2022-10-28 DIAGNOSIS — D631 Anemia in chronic kidney disease: Secondary | ICD-10-CM | POA: Diagnosis present

## 2022-10-28 DIAGNOSIS — F17210 Nicotine dependence, cigarettes, uncomplicated: Secondary | ICD-10-CM | POA: Diagnosis present

## 2022-10-28 DIAGNOSIS — Z8673 Personal history of transient ischemic attack (TIA), and cerebral infarction without residual deficits: Secondary | ICD-10-CM | POA: Diagnosis not present

## 2022-10-28 DIAGNOSIS — G40909 Epilepsy, unspecified, not intractable, without status epilepticus: Secondary | ICD-10-CM | POA: Diagnosis present

## 2022-10-28 DIAGNOSIS — I776 Arteritis, unspecified: Secondary | ICD-10-CM | POA: Diagnosis present

## 2022-10-28 DIAGNOSIS — I132 Hypertensive heart and chronic kidney disease with heart failure and with stage 5 chronic kidney disease, or end stage renal disease: Secondary | ICD-10-CM | POA: Diagnosis present

## 2022-10-28 DIAGNOSIS — M1990 Unspecified osteoarthritis, unspecified site: Secondary | ICD-10-CM | POA: Diagnosis present

## 2022-10-28 DIAGNOSIS — N2581 Secondary hyperparathyroidism of renal origin: Secondary | ICD-10-CM | POA: Diagnosis present

## 2022-10-28 LAB — C DIFFICILE QUICK SCREEN W PCR REFLEX
C Diff antigen: POSITIVE — AB
C Diff toxin: NEGATIVE

## 2022-10-28 LAB — HEPATITIS B SURFACE ANTIGEN: Hepatitis B Surface Ag: NONREACTIVE

## 2022-10-28 LAB — CBC
HCT: 41.8 % (ref 36.0–46.0)
HCT: 39.3 % (ref 36.0–46.0)
Hemoglobin: 12.5 g/dL (ref 12.0–15.0)
Hemoglobin: 11.9 g/dL — ABNORMAL LOW (ref 12.0–15.0)
MCH: 29.6 pg (ref 26.0–34.0)
MCH: 30.4 pg (ref 26.0–34.0)
MCHC: 29.9 g/dL — ABNORMAL LOW (ref 30.0–36.0)
MCHC: 30.3 g/dL (ref 30.0–36.0)
MCV: 99.1 fL (ref 80.0–100.0)
MCV: 100.3 fL — ABNORMAL HIGH (ref 80.0–100.0)
Platelets: 210 10*3/uL (ref 150–400)
Platelets: 174 10*3/uL (ref 150–400)
RBC: 4.22 MIL/uL (ref 3.87–5.11)
RBC: 3.92 MIL/uL (ref 3.87–5.11)
RDW: 16.4 % — ABNORMAL HIGH (ref 11.5–15.5)
RDW: 16.5 % — ABNORMAL HIGH (ref 11.5–15.5)
WBC: 8.9 10*3/uL (ref 4.0–10.5)
WBC: 7.5 10*3/uL (ref 4.0–10.5)
nRBC: 0.6 % — ABNORMAL HIGH (ref 0.0–0.2)
nRBC: 0.4 % — ABNORMAL HIGH (ref 0.0–0.2)

## 2022-10-28 LAB — RENAL FUNCTION PANEL
Albumin: 3.3 g/dL — ABNORMAL LOW (ref 3.5–5.0)
Anion gap: 16 — ABNORMAL HIGH (ref 5–15)
BUN: 22 mg/dL (ref 8–23)
CO2: 25 mmol/L (ref 22–32)
Calcium: 9.3 mg/dL (ref 8.9–10.3)
Chloride: 95 mmol/L — ABNORMAL LOW (ref 98–111)
Creatinine, Ser: 10.54 mg/dL — ABNORMAL HIGH (ref 0.44–1.00)
GFR, Estimated: 4 mL/min — ABNORMAL LOW (ref >60)
Glucose, Bld: 87 mg/dL (ref 70–99)
Phosphorus: 5.7 mg/dL — ABNORMAL HIGH (ref 2.5–4.6)
Potassium: 4 mmol/L (ref 3.5–5.1)
Sodium: 136 mmol/L (ref 135–145)

## 2022-10-28 LAB — HIV ANTIBODY (ROUTINE TESTING W REFLEX): HIV Screen 4th Generation wRfx: NONREACTIVE

## 2022-10-28 LAB — APTT: aPTT: 29 seconds (ref 24–36)

## 2022-10-28 LAB — LACTIC ACID, PLASMA: Lactic Acid, Venous: 1.5 mmol/L (ref 0.5–1.9)

## 2022-10-28 LAB — PROTIME-INR
INR: 1.1 (ref 0.8–1.2)
Prothrombin Time: 14.8 seconds (ref 11.4–15.2)

## 2022-10-28 LAB — CLOSTRIDIUM DIFFICILE BY PCR, REFLEXED: Toxigenic C. Difficile by PCR: POSITIVE — AB

## 2022-10-28 MED ORDER — LIDOCAINE-PRILOCAINE 2.5-2.5 % EX CREA: 1.0000 | TOPICAL_CREAM | CUTANEOUS | Status: DC | PRN

## 2022-10-28 MED ORDER — MIDODRINE HCL 5 MG PO TABS
5.0000 mg | ORAL_TABLET | Freq: Once | ORAL | Status: AC
  Administered 2022-10-28: 5 mg via ORAL
  Filled 2022-10-28: qty 1

## 2022-10-28 MED ORDER — MIDODRINE HCL 5 MG PO TABS
10.0000 mg | ORAL_TABLET | Freq: Once | ORAL | Status: AC
  Administered 2022-10-28: 10 mg via ORAL
  Filled 2022-10-28: qty 2

## 2022-10-28 MED ORDER — CHLORHEXIDINE GLUCONATE CLOTH 2 % EX PADS
6.0000 | MEDICATED_PAD | Freq: Every day | CUTANEOUS | Status: DC
  Administered 2022-10-30: 6 via TOPICAL

## 2022-10-28 MED ORDER — PENTAFLUOROPROP-TETRAFLUOROETH EX AERO: 1.0000 | INHALATION_SPRAY | CUTANEOUS | Status: DC | PRN

## 2022-10-28 MED ORDER — FIDAXOMICIN 200 MG PO TABS
200.0000 mg | ORAL_TABLET | Freq: Two times a day (BID) | ORAL | Status: DC
  Administered 2022-10-28 – 2022-10-30 (×4): 200 mg via ORAL
  Filled 2022-10-28 (×5): qty 1

## 2022-10-28 MED ORDER — LIDOCAINE HCL (PF) 1 % IJ SOLN: 5.0000 mL | INTRAMUSCULAR | Status: DC | PRN

## 2022-10-28 MED ORDER — SODIUM CHLORIDE 0.9 % IV BOLUS
1000.0000 mL | Freq: Once | INTRAVENOUS | Status: AC
  Administered 2022-10-28: 1000 mL via INTRAVENOUS

## 2022-10-28 NOTE — ED Notes (Signed)
Patient pressures soft, MD aware, just said keep MAP above 65. Will continue to monitor and will let them know if it drops below.

## 2022-10-28 NOTE — Progress Notes (Signed)
POST HD TX NOTE  10/28/22 1735  Vitals  Temp 98.2 F (36.8 C)  Temp Source Oral  BP 125/80  MAP (mmHg) 94  BP Location Right Arm  BP Method Automatic  Patient Position (if appropriate) Lying  Pulse Rate  (not picking up)  Pulse Rate Source Monitor  ECG Heart Rate 82  Resp 16  Oxygen Therapy  SpO2  (not picking up)  O2 Device Room Air  Pulse Oximetry Type Continuous  During Treatment Monitoring  Intra-Hemodialysis Comments (S)   (post HD tx VS check)  Post Treatment  Dialyzer Clearance Lightly streaked  Duration of HD Treatment -hour(s) 3 hour(s)  Hemodialysis Intake (mL) 0 mL  Liters Processed 72  Fluid Removed (mL) 0 mL  Tolerated HD Treatment Yes  Post-Hemodialysis Comments (S)  tx completed w/o problem other than one episode of low bp in the 80's that resolved w/ midodrine admin. UF goal of keep even met, blood rinsed back. VSS. Medication Admin: Midodrine 10mg  po  AVG/AVF Arterial Site Held (minutes) 10 minutes  AVG/AVF Venous Site Held (minutes) 10 minutes  Fistula / Graft Left Upper arm Arteriovenous vein graft  Placement Date/Time: (c) 05/16/21 1535   Placed prior to admission: Yes  Orientation: Left  Access Location: Upper arm  Access Type: (c) Arteriovenous vein graft  Site Condition No complications  Fistula / Graft Assessment Bruit;Thrill;Present  Drainage Description None

## 2022-10-28 NOTE — Progress Notes (Addendum)
FMTS Interim Progress Note  S: to bedside due to persistently low BP. Pt awake and alert, talkative. Denies concerns.   O: BP (!) 85/63   Pulse 87   Temp 97.9 F (36.6 C)   Resp 15   Ht 5\' 4"  (1.626 m)   Wt 48.1 kg   SpO2 100%   BMI 18.19 kg/m    Gen: NAD, awake, alert CV: RRR no murmur Pulm: CTAB on RA, no resp distress Neuro: alert and oriented x3   A/P:  Hypotension Hgb stable, afebrile, lactate normalized, euvolemic on exam CTA and CXR unremarkable  Refractory to bolus x3 and midodrine 5mg  x2 Cautious with fluid resuscitation due to ESRD  -Consulted CCM on-call Dr. Everardo All who evaluated the patient, will give additional 1L bolus per her recommendation given that pt is clinically stable on room air. Pt has reported history of hypotension with HD sessions as well. Will reach back out to CCM if any acute changes in clinical status and/or concern for hypoperfusion  Vonna Drafts, MD 10/28/2022, 2:45 AM PGY-1, [facility] Family Medicine Service pager [phone]

## 2022-10-28 NOTE — Consult Note (Signed)
[facility] Renal Consultation Note    Indication for Consultation:  Management of ESRD/hemodialysis; anemia, hypertension/volume and secondary hyperparathyroidism   HPI: Kathleen [name] is a 62 y.o. female with ESRD on HD, SLE, seizure d/o, h/o GIB- prior EGD  12/2021 with multiple gastric/duodenal ulcers. She is admitted with hypotension and acute GI bleeding. Reports abdominal pain and bloody stools for the past week. Hemoglobin 14.5 > 12.5.  CT angio negative for GI hemorrhage. Hypotensive on arrival. Has been given IVF bolus. GI consulted. She does endorse some dizziness this am.   Dialysis TTS at [facility]. Last dialysis 6/18. Typically blood pressures before dialysis are high then drop during treatment. Has had some hypotensive episodes this week. She states she has been taken off blood pressure medications.   Past Medical History:  Diagnosis Date   Anemia    Arthritis    Cardiac arrest (HCC)    Cellulitis    Cerebral vasculitis    COPD (chronic obstructive pulmonary disease) (HCC)    Elevated antinuclear antibody (ANA) level    Endometriosis    ESRD (end stage renal disease) on dialysis (HCC)    Gallstones    Hypertension    Lupus (HCC)    Migraine    PONV (postoperative nausea and vomiting)    Seizures (HCC)    Sepsis (HCC)    Sleep apnea    Suicide attempt (HCC)    TB (tuberculosis)    Transient ischemic attack (TIA)    Vasculitis of skin    Past Surgical History:  Procedure Laterality Date   AV FISTULA PLACEMENT Left 07/04/2017   Procedure: ARTERIOVENOUS (AV) FISTULA CREATION;  Surgeon: Fransisco Hertz, MD;  Location: [facility] OR;  Service: Vascular;  Laterality: Left;   AV FISTULA PLACEMENT Left 11/27/2019   Procedure: INSERTION OF ARTERIOVENOUS (AV) GORE-TEX GRAFT LEFT UPPER ARM;  Surgeon: Chuck Hint, MD;  Location: [facility] OR;  Service: Vascular;  Laterality: Left;   AV FISTULA PLACEMENT Left 07/29/2020   Procedure: INSERTION OF LEFT UPPER ARM  ARTERIOVENOUS (AV) GORE-TEX GRAFT;  Surgeon: Chuck Hint, MD;  Location: [facility] OR;  Service: Vascular;  Laterality: Left;   BIOPSY  12/23/2021   Procedure: BIOPSY;  Surgeon: Tressia Danas, MD;  Location: [facility] ENDOSCOPY;  Service: Gastroenterology;;   BUBBLE STUDY  05/25/2021   Procedure: BUBBLE STUDY;  Surgeon: Sande Rives, MD;  Location: [facility] ENDOSCOPY;  Service: Cardiovascular;;   CESAREAN SECTION     COLONOSCOPY WITH PROPOFOL N/A 12/23/2021   Procedure: COLONOSCOPY WITH PROPOFOL;  Surgeon: Tressia Danas, MD;  Location: [facility] ENDOSCOPY;  Service: Gastroenterology;  Laterality: N/A;   ESOPHAGOGASTRODUODENOSCOPY N/A 07/15/2016   Procedure: ESOPHAGOGASTRODUODENOSCOPY (EGD);  Surgeon: Carman Ching, MD;  Location: [facility] END[facility]COPY;  Service: Endoscopy;  Laterality: N/A;   ESOPHAGOGASTRODUODENOSCOPY N/A 12/23/2021   Procedure: ESOPHAGOGASTRODUODENOSCOPY (EGD);  Surgeon: Tressia Danas, MD;  Location: [facility] ENDOSCOPY;  Service: Gastroenterology;  Laterality: N/A;   I & D EXTREMITY Left 05/27/2021   Procedure: IRRIGATION AND DEBRIDEMENT OF WRIST;  Surgeon: Bradly Bienenstock, MD;  Location: [facility] OR;  Service: Orthopedics;  Laterality: Left;   INSERTION OF DIALYSIS CATHETER Right 07/04/2017   Procedure: INSERTION OF TUNNELED  DIALYSIS CATHETER - RIGHT INTERNAL JUGULAR PLACEMENT;  Surgeon: Fransisco Hertz, MD;  Location: [facility] OR;  Service: Vascular;  Laterality: Right;   IR FLUORO GUIDE CV LINE RIGHT  09/08/2021   IR THROMBECTOMY AV FISTULA W/THROMBOLYSIS/PTA INC/SHUNT/IMG LEFT Left 09/10/2021   IR US GUIDE VASC ACCESS LEFT  09/10/2021  IR US GUIDE VASC ACCESS RIGHT  09/08/2021   LUNG BIOPSY     POLYPECTOMY  12/23/2021   Procedure: POLYPECTOMY;  Surgeon: Tressia Danas, MD;  Location: [facility] ENDOSCOPY;  Service: Gastroenterology;;   TEE WITHOUT CARDIOVERSION N/A 05/25/2021   Procedure: TRANSESOPHAGEAL ECHOCARDIOGRAM (TEE);  Surgeon: Sande Rives, MD;  Location: [facility] ENDOSCOPY;  Service: Cardiovascular;   Laterality: N/A;   TUBAL LIGATION     Family History  Problem Relation Age of Onset   Ovarian cancer Mother    Breast cancer Sister    Throat cancer Brother    Social History:  reports that she has been smoking cigarettes. She has been smoking an average of .25 packs per day. She has never used smokeless tobacco. She reports that she does not drink alcohol and does not use drugs. No Known Allergies Prior to Admission medications   Medication Sig Start Date End Date Taking? Authorizing Provider  acetaminophen (TYLENOL) 325 MG tablet Take 650 mg by mouth every 6 (six) hours as needed for pain.   Yes [provider]  sevelamer carbonate (RENVELA) 800 MG tablet Take 800-2,400 mg by mouth See admin instructions. 2400 mg with meals 800 mg with snacks 08/28/21  Yes [provider]  carvedilol (COREG) 3.125 MG tablet Take 1 tablet (3.125 mg total) by mouth 2 (two) times daily with a meal. Patient not taking: Reported on 10/27/2022 06/04/21   Joseph Art, DO  nicotine (NICODERM CQ - DOSED IN MG/24 HOURS) 14 mg/24hr patch Place 1 patch (14 mg total) onto the skin daily. Patient not taking: Reported on 10/27/2022 12/25/21   Sabino Dick, DO  pantoprazole (PROTONIX) 40 MG tablet Take 1 tablet (40 mg total) by mouth 2 (two) times daily for 8 weeks. Patient not taking: Reported on 10/27/2022 12/24/21 02/17/22  Sabino Dick, DO  pantoprazole (PROTONIX) 40 MG tablet Take 1 tablet (40 mg total) by mouth daily. Patient not taking: Reported on 10/27/2022 02/18/22 02/18/23  Sabino Dick, DO   Current Facility-Administered Medications  Medication Dose Route Frequency Provider Last Rate Last Admin   Chlorhexidine Gluconate Cloth 2 % PADS 6 each  6 each Topical Q0600 Tomasa Blase, PA-C       pantoprazole (PROTONIX) injection 40 mg  40 mg Intravenous BID Vonna Drafts, MD   40 mg at 10/28/22 0934   sodium chloride flush (NS) 0.9 % injection 3 mL  3 mL Intravenous  Q12H Vonna Drafts, MD   3 mL at 10/28/22 1610   Current Outpatient Medications  Medication Sig Dispense Refill   acetaminophen (TYLENOL) 325 MG tablet Take 650 mg by mouth every 6 (six) hours as needed for pain.     sevelamer carbonate (RENVELA) 800 MG tablet Take 800-2,400 mg by mouth See admin instructions. 2400 mg with meals 800 mg with snacks     carvedilol (COREG) 3.125 MG tablet Take 1 tablet (3.125 mg total) by mouth 2 (two) times daily with a meal. (Patient not taking: Reported on 10/27/2022) 60 tablet 0   nicotine (NICODERM CQ - DOSED IN MG/24 HOURS) 14 mg/24hr patch Place 1 patch (14 mg total) onto the skin daily. (Patient not taking: Reported on 10/27/2022) 28 patch 0   pantoprazole (PROTONIX) 40 MG tablet Take 1 tablet (40 mg total) by mouth 2 (two) times daily for 8 weeks. (Patient not taking: Reported on 10/27/2022) 110 tablet 0   pantoprazole (PROTONIX) 40 MG tablet Take 1 tablet (40 mg total) by mouth daily. (Patient not taking: Reported  on 10/27/2022) 30 tablet 11     ROS: As per HPI otherwise negative.  Physical Exam: Vitals:   10/28/22 0702 10/28/22 0726 10/28/22 0815 10/28/22 1000  BP: 107/72  (!) 105/91 103/88  Pulse: 85     Resp: 14  12 18   Temp:  97.9 F (36.6 C)    TempSrc:  Oral    SpO2: 100%     Weight:      Height:         General: Non-toxic appearing, conversant, nad  Head: NCAT sclera not icteric  Neck: Supple. No masses  Lungs: Clear bilaterally without wheezes, rales, or rhonchi.  Heart: RRR, no murmur, rub, or gallop  Abdomen: soft mild tenderness to palpation  Lower extremities: no edema  Neuro: A & O X 3. Moves all extremities spontaneously. Psych:  Responds to questions appropriately with a normal affect. Dialysis Access: L UE AVG +bruit   Labs: Basic Metabolic Panel: Recent Labs  Lab 10/27/22 1445 10/27/22 1523 10/28/22 0132  NA 139 141 136  K 4.2 4.2 4.0  CL 91* 97* 95*  CO2 29  --  25  GLUCOSE 105* 104* 87  BUN 18 20 22    CREATININE 9.89* 9.40* 10.54*  CALCIUM 10.5*  --  9.3  PHOS  --   --  5.7*   Liver Function Tests: Recent Labs  Lab 10/27/22 1445 10/28/22 0132  AST 33  --   ALT 14  --   ALKPHOS 83  --   BILITOT 0.5  --   PROT 9.9*  --   ALBUMIN 4.4 3.3*   Recent Labs  Lab 10/27/22 1445  LIPASE 46   No results for input(s): "AMMONIA" in the last 168 hours. CBC: Recent Labs  Lab 10/27/22 1445 10/27/22 1523 10/28/22 0132  WBC 9.4  --  8.9  NEUTROABS 6.2  --   --   HGB 14.5 17.0* 12.5  HCT 47.6* 50.0* 41.8  MCV 101.7*  --  99.1  PLT 233  --  210   Cardiac Enzymes: No results for input(s): "CKTOTAL", "CKMB", "CKMBINDEX", "TROPONINI" in the last 168 hours. CBG: No results for input(s): "GLUCAP" in the last 168 hours. Iron Studies: No results for input(s): "IRON", "TIBC", "TRANSFERRIN", "FERRITIN" in the last 72 hours. Studies/Results: CT Angio Abd/Pel W and/or Wo Contrast  Result Date: 10/27/2022 CLINICAL DATA:  Lower GI bleed EXAM: CTA ABDOMEN AND PELVIS WITHOUT AND WITH CONTRAST TECHNIQUE: Multidetector CT imaging of the abdomen and pelvis was performed using the standard protocol during bolus administration of intravenous contrast. Multiplanar reconstructed images and MIPs were obtained and reviewed to evaluate the vascular anatomy. RADIATION DOSE REDUCTION: This exam was performed according to the departmental dose-optimization program which includes automated exposure control, adjustment of the mA and/or kV according to patient size and/or use of iterative reconstruction technique. CONTRAST:  75mL OMNIPAQUE IOHEXOL 350 MG/ML SOLN COMPARISON:  12/20/2021, 08/21/2022 FINDINGS: VASCULAR Aorta: Atherosclerotic calcifications are noted without aneurysmal dilatation. No evidence of dissection is seen. Some infrarenal aortic ulcerations are seen without complicating factors. Celiac: Patent without evidence of aneurysm, dissection, vasculitis or significant stenosis. SMA: Patent without evidence  of aneurysm, dissection, vasculitis or significant stenosis. Renals: The renal arteries are diminutive bilaterally IMA: Diminutive but patent Inflow: Iliacs demonstrate atherosclerotic calcification with some complex focal narrowing in the proximal right external iliac artery. Veins: No specific venous abnormality is noted. Review of the MIP images confirms the above findings. NON-VASCULAR Lower chest: Visualized lung bases demonstrate bilateral scarring  similar to that seen on prior exam. Hepatobiliary: Liver is within normal limits. Gallbladder is well distended with multiple gallstones. No wall thickening or pericholecystic fluid is noted. Pancreas: Unremarkable. No pancreatic ductal dilatation or surrounding inflammatory changes. Spleen: Normal in size without focal abnormality. Adrenals/Urinary Tract: Adrenal glands are within normal limits. Kidneys are atrophic bilaterally. No obstructive changes are noted. The bladder is decompressed. Stomach/Bowel: The appendix is within normal limits. No obstructive or inflammatory changes of the colon or small bowel are seen. No areas of active extravasation are identified to correspond with the given clinical history of GI hemorrhage. Stomach is within normal limits. Lymphatic: No significant lymphadenopathy is seen. Reproductive: Uterus and bilateral adnexa are unremarkable. Other: No abdominal wall hernia or abnormality. No abdominopelvic ascites. Musculoskeletal: No acute or significant osseous findings. IMPRESSION: VASCULAR Atherosclerotic calcifications are noted. Focal narrowing in the proximal right external iliac artery is noted. No aneurysmal dilatation or dissection is seen. The renal arteries are diminutive bilaterally with associated renal atrophy. No other vascular abnormality is seen. NON-VASCULAR Cholelithiasis without complicating factors. No findings to suggest active GI hemorrhage. Electronically Signed   By: Alcide Clever M.D.   On: 10/27/2022 20:00    DG Chest Portable 1 View  Result Date: 10/27/2022 CLINICAL DATA:  Abdominal pain, vomiting, rectal bleeding EXAM: PORTABLE CHEST 1 VIEW COMPARISON:  12/20/2021 FINDINGS: Transverse diameter of heart is increased. There are no signs of pulmonary edema or focal pulmonary consolidation. There is no pleural effusion or pneumothorax. Small linear densities in medial left lower lung field may suggest scarring. There is no pleural effusion or pneumothorax. IMPRESSION: There are no signs of pulmonary edema or focal pulmonary consolidation. Electronically Signed   By: Ernie Avena M.D.   On: 10/27/2022 16:19    Dialysis Orders:  [facility] TTS 3:15 350/A1.5x  EDW 50.7 kg 2K/2Ca AVG Heparin 3600  -Mircera 75 q 2 wks - last 6/13  -Hectorol 3 q HD, Sensipar 60 q HD    Assessment/Plan: GIB/Hematochezia - h/o gastric/duodenal ulcers. GI consulted.  Hypotension - likely secondary to bleeding/GI losses. Received 2 L IVF in the ED. Follow. H/o HTN - off BP meds for now  ESRD -  HD TTS. Continue on schedule. HD today. Follow weights. No UF with HD today Anemia  - Hgb 12.5. On ESA as outpatient. Follow trends.  Metabolic bone disease -  Continue home binders/Sensipar when eating.  Nutrition - Renal diet when eating.   Tomasa Blase PA-C [facility] 10/28/2022, 10:18 AM

## 2022-10-28 NOTE — Assessment & Plan Note (Addendum)
-   Consult Nephrology - Appreciate their consultation and care - Restart renvala - Scheduled HD TThS

## 2022-10-28 NOTE — Consult Note (Signed)
Referring Provider: Dr. Gloris Manchester  Primary Care Physician:  Hildred Priest, NP Primary Gastroenterologist:  Gentry Fitz   Reason for Consultation: Nausea, abdominal pain and hematochezia  HPI: Kathleen [name] is a 62 y.o. female with a past medical history of hypertension, CHF, TIA, ESRD, SLE, seizures, migraine headaches, cerebral vasculitis, chronic anemia, COPD, PUD and prior GI bleed.  She developed hematochezia about 2 weeks ago with the onset of nausea with generalized abdominal pain 2 to 3 days ago. She underwent dialysis yesterday and he BP was soft. She felt weak and was sent to the ED for further evaluation.  Labs in the ED showed a WBC count of 9.4.  Hemoglobin 14.5 -> 17 (baseline Hg 11 - 13). Hematocrit 47.6.  MCV 101.7.  Platelet 233.  BUN 18.  Creatinine 9.89.  Normal LFTs.  Lipase 46.  Lactic acid 3.9 -> 2.5 -> 1.5.  INR 1.1.  Troponin 23 -> 21.  HIV nonreactive.  Negative chest x-ray.  CTA without evidence of active GI bleeding and focal narrowing in the proximal right external iliac artery and cholelithiasis were identified.  A GI consult was requested for further evaluation regarding abdominal pain and hematochezia.  She is a challenging historian.  She developed bright red blood with her bowel movements about 2 weeks ago, initially the blood was dark red and small volume.  Over the past 5 to 7 days, she started passing bright red blood per the rectum sometimes with soft to loose stool and sometimes passed only blood.  She developed generalized abdominal pain 2 to 3 days ago which she describes as a twisting pain.  No vomiting.  No NSAIDs.  Smoker.  No alcohol use.  She passed a small to moderate amount of brown watery stool with bits of stool and a few flecks of red blood as seen in the bedpan at this time.  No GERD symptoms.  Labs today hemoglobin 12.5.  Hematocrit 41.8.  Platelet 210.  INR 1.1.  She has a history of GI bleed (painless hematochezia) which required hospital  admission 12/2021. She underwent an EGD and colonoscopy 12/23/2021 that hospitalization. The EGD identified nonbleeding gastric and duodenal ulcers.  Biopsies showed mild chronic inactive gastritis negative for H. pylori and chronic peptic duodenitis. She was prescribed PPI twice daily and was advised to repeat an EGD in 8 to 10 weeks which was not done.  The colonoscopy showed altered vascular, congested erythematous mucosa in the rectum, sigmoid, descending colon and at the splenic flexure suggestive of possible ischemic versus infectious colitis and one 4 mm polyp was removed from the rectum.  Path report showed normal colon biopsies without evidence of colitis and rectal polyp was hyperplastic.  GI PROCEDURES:  Colonoscopy by Dr. Orvan Falconer as an inpatient 12/23/2021:  - Altered vascular, congested and erythematous mucosa in the rectum, in the sigmoid colon, descending colon, and splenic flexure. This is of unclear clinical significance but most consider infectious etiologies and ischemic colitis, although endoscopic appearance and location of findings is not classic for ischemic colitis. Biopsied.  - One 4 mm polyp in the rectum, removed with a cold snare. Resected and retrieved.  - The examination was otherwise normal on direct and retroflexion views.  -Repeat colonoscopy in 10 years  EGD 12/23/2021: - Normal esophagus.  - Non-bleeding gastric ulcers with no stigmata of bleeding. Biopsied.  - Non-bleeding duodenal ulcers. Biopsied. - Repeat EGD in 8 to 10 weeks to assess for ulcer healing - PPI twice daily  A. DUODENUM, BIOPSY:  -  Duodenal mucosa with prominent Brunner's glands and extensive  foveolar metaplasia consistent with chronic peptic duodenitis.   B. STOMACH, BIOPSY:  -  Antral and oxyntic mucosa with mild chronic inactive gastritis.  -  No Helicobacter pylori organisms identified on HE stained slide.   C. COLON, RANDOM, BIOPSY:  -  Colonic mucosa within normal limits.   D.  RECTUM, POLYPECTOMY:  -  Hyperplastic polyp.   EGD 2018 by Dr. Randa Evens for gastric wall thickening seen on CT: Normal EGD   Past Medical History:  Diagnosis Date   Anemia    Arthritis    Cardiac arrest (HCC)    Cellulitis    Cerebral vasculitis    COPD (chronic obstructive pulmonary disease) (HCC)    Elevated antinuclear antibody (ANA) level    Endometriosis    ESRD (end stage renal disease) on dialysis (HCC)    Gallstones    Hypertension    Lupus (HCC)    Migraine    PONV (postoperative nausea and vomiting)    Seizures (HCC)    Sepsis (HCC)    Sleep apnea    Suicide attempt (HCC)    TB (tuberculosis)    Transient ischemic attack (TIA)    Vasculitis of skin     Past Surgical History:  Procedure Laterality Date   AV FISTULA PLACEMENT Left 07/04/2017   Procedure: ARTERIOVENOUS (AV) FISTULA CREATION;  Surgeon: Fransisco Hertz, MD;  Location: [facility] OR;  Service: Vascular;  Laterality: Left;   AV FISTULA PLACEMENT Left 11/27/2019   Procedure: INSERTION OF ARTERIOVENOUS (AV) GORE-TEX GRAFT LEFT UPPER ARM;  Surgeon: Chuck Hint, MD;  Location: [facility] OR;  Service: Vascular;  Laterality: Left;   AV FISTULA PLACEMENT Left 07/29/2020   Procedure: INSERTION OF LEFT UPPER ARM ARTERIOVENOUS (AV) GORE-TEX GRAFT;  Surgeon: Chuck Hint, MD;  Location: [facility] OR;  Service: Vascular;  Laterality: Left;   BIOPSY  12/23/2021   Procedure: BIOPSY;  Surgeon: Tressia Danas, MD;  Location: [facility] ENDOSCOPY;  Service: Gastroenterology;;   BUBBLE STUDY  05/25/2021   Procedure: BUBBLE STUDY;  Surgeon: Sande Rives, MD;  Location: [facility] ENDOSCOPY;  Service: Cardiovascular;;   CESAREAN SECTION     COLONOSCOPY WITH PROPOFOL N/A 12/23/2021   Procedure: COLONOSCOPY WITH PROPOFOL;  Surgeon: Tressia Danas, MD;  Location: [facility] ENDOSCOPY;  Service: Gastroenterology;  Laterality: N/A;   ESOPHAGOGASTRODUODENOSCOPY N/A 07/15/2016   Procedure: ESOPHAGOGASTRODUODENOSCOPY (EGD);  Surgeon: Carman Ching, MD;  Location: [facility] ENDOSCOPY;  Service: Endoscopy;  Laterality: N/A;   ESOPHAGOGASTRODUODENOSCOPY N/A 12/23/2021   Procedure: ESOPHAGOGASTRODUODENOSCOPY (EGD);  Surgeon: Tressia Danas, MD;  Location: [facility] ENDOSCOPY;  Service: Gastroenterology;  Laterality: N/A;   I & D EXTREMITY Left 05/27/2021   Procedure: IRRIGATION AND DEBRIDEMENT OF WRIST;  Surgeon: Bradly Bienenstock, MD;  Location: [facility] OR;  Service: Orthopedics;  Laterality: Left;   INSERTION OF DIALYSIS CATHETER Right 07/04/2017   Procedure: INSERTION OF TUNNELED  DIALYSIS CATHETER - RIGHT INTERNAL JUGULAR PLACEMENT;  Surgeon: Fransisco Hertz, MD;  Location: [facility] OR;  Service: Vascular;  Laterality: Right;   IR FLUORO GUIDE CV LINE RIGHT  09/08/2021   IR THROMBECTOMY AV FISTULA W/THROMBOLYSIS/PTA INC/SHUNT/IMG LEFT Left 09/10/2021   IR US GUIDE VASC ACCESS LEFT  09/10/2021   IR US GUIDE VASC ACCESS RIGHT  09/08/2021   LUNG BIOPSY     POLYPECTOMY  12/23/2021   Procedure: POLYPECTOMY;  Surgeon: Tressia Danas, MD;  Location: [facility] ENDOSCOPY;  Service: Gastroenterology;;   TEE WITHOUT  CARDIOVERSION N/A 05/25/2021   Procedure: TRANSESOPHAGEAL ECHOCARDIOGRAM (TEE);  Surgeon: Sande Rives, MD;  Location: [facility] ENDOSCOPY;  Service: Cardiovascular;  Laterality: N/A;   TUBAL LIGATION      Prior to Admission medications   Medication Sig Start Date End Date Taking? Authorizing Provider  acetaminophen (TYLENOL) 325 MG tablet Take 650 mg by mouth every 6 (six) hours as needed for pain.   Yes [provider]  sevelamer carbonate (RENVELA) 800 MG tablet Take 800-2,400 mg by mouth See admin instructions. 2400 mg with meals 800 mg with snacks 08/28/21  Yes [provider]  carvedilol (COREG) 3.125 MG tablet Take 1 tablet (3.125 mg total) by mouth 2 (two) times daily with a meal. Patient not taking: Reported on 10/27/2022 06/04/21   Joseph Art, DO  nicotine (NICODERM CQ - DOSED IN MG/24 HOURS) 14 mg/24hr patch Place 1 patch (14 mg total)  onto the skin daily. Patient not taking: Reported on 10/27/2022 12/25/21   Sabino Dick, DO  pantoprazole (PROTONIX) 40 MG tablet Take 1 tablet (40 mg total) by mouth 2 (two) times daily for 8 weeks. Patient not taking: Reported on 10/27/2022 12/24/21 02/17/22  Sabino Dick, DO  pantoprazole (PROTONIX) 40 MG tablet Take 1 tablet (40 mg total) by mouth daily. Patient not taking: Reported on 10/27/2022 02/18/22 02/18/23  Sabino Dick, DO    Current Facility-Administered Medications  Medication Dose Route Frequency Provider Last Rate Last Admin   pantoprazole (PROTONIX) injection 40 mg  40 mg Intravenous BID Vonna Drafts, MD   40 mg at 10/27/22 2146   sodium chloride flush (NS) 0.9 % injection 3 mL  3 mL Intravenous Q12H Vonna Drafts, MD       Current Outpatient Medications  Medication Sig Dispense Refill   acetaminophen (TYLENOL) 325 MG tablet Take 650 mg by mouth every 6 (six) hours as needed for pain.     sevelamer carbonate (RENVELA) 800 MG tablet Take 800-2,400 mg by mouth See admin instructions. 2400 mg with meals 800 mg with snacks     carvedilol (COREG) 3.125 MG tablet Take 1 tablet (3.125 mg total) by mouth 2 (two) times daily with a meal. (Patient not taking: Reported on 10/27/2022) 60 tablet 0   nicotine (NICODERM CQ - DOSED IN MG/24 HOURS) 14 mg/24hr patch Place 1 patch (14 mg total) onto the skin daily. (Patient not taking: Reported on 10/27/2022) 28 patch 0   pantoprazole (PROTONIX) 40 MG tablet Take 1 tablet (40 mg total) by mouth 2 (two) times daily for 8 weeks. (Patient not taking: Reported on 10/27/2022) 110 tablet 0   pantoprazole (PROTONIX) 40 MG tablet Take 1 tablet (40 mg total) by mouth daily. (Patient not taking: Reported on 10/27/2022) 30 tablet 11    Allergies as of 10/27/2022   (No Known Allergies)    Family History  Problem Relation Age of Onset   Ovarian cancer Mother    Breast cancer Sister    Throat cancer Brother     Social History    Socioeconomic History   Marital status: Significant Other    Spouse name: Not on file   Number of children: Not on file   Years of education: Not on file   Highest education level: Not on file  Occupational History   Not on file  Tobacco Use   Smoking status: Some Days    Packs/day: .25    Types: Cigarettes   Smokeless tobacco: Never  Vaping Use   Vaping Use: Never used  Substance and Sexual Activity   Alcohol use: No   Drug use: No   Sexual activity: Not Currently    Birth control/protection: Post-menopausal  Other Topics Concern   Not on file  Social History Narrative   Not on file   Social Determinants of Health   Financial Resource Strain: Not on file  Food Insecurity: Not on file  Transportation Needs: Not on file  Physical Activity: Not on file  Stress: Not on file  Social Connections: Not on file  Intimate Partner Violence: Not on file    Review of Systems: Gen: Denies fever, sweats or chills. No weight loss.  CV: Denies chest pain, palpitations or edema. Resp: Denies cough, shortness of breath of hemoptysis.  GI: See HPI.   GU : Anuric on dialysis. MS: Denies joint pain, muscles aches or weakness. Derm: Denies rash, itchiness, skin lesions or unhealing ulcers. Psych: Denies depression, anxiety, memory loss or confusion. Heme: Denies easy bruising, bleeding. Neuro:  Denies headaches, dizziness or paresthesias. Endo:  + DM type II.  Physical Exam: Vital signs in last 24 hours: Temp:  [97.8 F (36.6 C)-98 F (36.7 C)] 97.8 F (36.6 C) (06/20 0333) Pulse Rate:  [81-100] 85 (06/20 0702) Resp:  [10-20] 14 (06/20 0702) BP: (71-126)/(49-86) 107/72 (06/20 0702) SpO2:  [99 %-100 %] 100 % (06/20 0702) Weight:  [48.1 kg] 48.1 kg (06/19 1430)   General: Alert 62 year old female in no acute distress. Head:  Normocephalic and atraumatic. Eyes:  No scleral icterus. Conjunctiva pink. Ears:  Normal auditory acuity. Nose:  No deformity, discharge or  lesions. Mouth: Upper dentures in use.  No ulcers or lesions.  Neck:  Supple. No lymphadenopathy or thyromegaly.  Lungs: Breath sounds clear throughout. No wheezes, rhonchi or crackles.  Heart: Regular rate and rhythm, no murmurs. Abdomen:, Nondistended.  Generalized tenderness throughout the abdomen without rebound or guarding.  Positive bowel sounds to all 4 quadrants. Rectal: Deferred.  Bedpan showed a moderate amount of brown watery stool with bits of solid stool and a few flecks of bright red blood. Musculoskeletal:  Symmetrical without gross deformities.  Pulses:  Normal pulses noted. Extremities:  Without clubbing or edema.  LUE AV fistula with positive bruit and thrill. Neurologic:  Alert and  oriented x 4. No focal deficits.  Response to questions is somewhat delayed. Skin:  Intact without significant lesions or rashes. Psych:  Alert and cooperative. Normal mood and affect.  Intake/Output from previous day: 06/19 0701 - 06/20 0700 In: 2009.1 [IV Piggyback:2009.1] Out: -  Intake/Output this shift: No intake/output data recorded.  Lab Results: Recent Labs    10/27/22 1445 10/27/22 1523 10/28/22 0132  WBC 9.4  --  8.9  HGB 14.5 17.0* 12.5  HCT 47.6* 50.0* 41.8  PLT 233  --  210   BMET Recent Labs    10/27/22 1445 10/27/22 1523 10/28/22 0132  NA 139 141 136  K 4.2 4.2 4.0  CL 91* 97* 95*  CO2 29  --  25  GLUCOSE 105* 104* 87  BUN 18 20 22   CREATININE 9.89* 9.40* 10.54*  CALCIUM 10.5*  --  9.3   LFT Recent Labs    10/27/22 1445 10/28/22 0132  PROT 9.9*  --   ALBUMIN 4.4 3.3*  AST 33  --   ALT 14  --   ALKPHOS 83  --   BILITOT 0.5  --    PT/INR Recent Labs    10/27/22 1445 10/28/22 0132  LABPROT 13.9 14.8  INR 1.1 1.1   Hepatitis Panel No results for input(s): "HEPBSAG", "HCVAB", "HEPAIGM", "HEPBIGM" in the last 72 hours.    Studies/Results: CT Angio Abd/Pel W and/or Wo Contrast  Result Date: 10/27/2022 CLINICAL DATA:  Lower GI bleed EXAM:  CTA ABDOMEN AND PELVIS WITHOUT AND WITH CONTRAST TECHNIQUE: Multidetector CT imaging of the abdomen and pelvis was performed using the standard protocol during bolus administration of intravenous contrast. Multiplanar reconstructed images and MIPs were obtained and reviewed to evaluate the vascular anatomy. RADIATION DOSE REDUCTION: This exam was performed according to the departmental dose-optimization program which includes automated exposure control, adjustment of the mA and/or kV according to patient size and/or use of iterative reconstruction technique. CONTRAST:  75mL OMNIPAQUE IOHEXOL 350 MG/ML SOLN COMPARISON:  12/20/2021, 08/21/2022 FINDINGS: VASCULAR Aorta: Atherosclerotic calcifications are noted without aneurysmal dilatation. No evidence of dissection is seen. Some infrarenal aortic ulcerations are seen without complicating factors. Celiac: Patent without evidence of aneurysm, dissection, vasculitis or significant stenosis. SMA: Patent without evidence of aneurysm, dissection, vasculitis or significant stenosis. Renals: The renal arteries are diminutive bilaterally IMA: Diminutive but patent Inflow: Iliacs demonstrate atherosclerotic calcification with some complex focal narrowing in the proximal right external iliac artery. Veins: No specific venous abnormality is noted. Review of the MIP images confirms the above findings. NON-VASCULAR Lower chest: Visualized lung bases demonstrate bilateral scarring similar to that seen on prior exam. Hepatobiliary: Liver is within normal limits. Gallbladder is well distended with multiple gallstones. No wall thickening or pericholecystic fluid is noted. Pancreas: Unremarkable. No pancreatic ductal dilatation or surrounding inflammatory changes. Spleen: Normal in size without focal abnormality. Adrenals/Urinary Tract: Adrenal glands are within normal limits. Kidneys are atrophic bilaterally. No obstructive changes are noted. The bladder is decompressed. Stomach/Bowel:  The appendix is within normal limits. No obstructive or inflammatory changes of the colon or small bowel are seen. No areas of active extravasation are identified to correspond with the given clinical history of GI hemorrhage. Stomach is within normal limits. Lymphatic: No significant lymphadenopathy is seen. Reproductive: Uterus and bilateral adnexa are unremarkable. Other: No abdominal wall hernia or abnormality. No abdominopelvic ascites. Musculoskeletal: No acute or significant osseous findings. IMPRESSION: VASCULAR Atherosclerotic calcifications are noted. Focal narrowing in the proximal right external iliac artery is noted. No aneurysmal dilatation or dissection is seen. The renal arteries are diminutive bilaterally with associated renal atrophy. No other vascular abnormality is seen. NON-VASCULAR Cholelithiasis without complicating factors. No findings to suggest active GI hemorrhage. Electronically Signed   By: Alcide Clever M.D.   On: 10/27/2022 20:00   DG Chest Portable 1 View  Result Date: 10/27/2022 CLINICAL DATA:  Abdominal pain, vomiting, rectal bleeding EXAM: PORTABLE CHEST 1 VIEW COMPARISON:  12/20/2021 FINDINGS: Transverse diameter of heart is increased. There are no signs of pulmonary edema or focal pulmonary consolidation. There is no pleural effusion or pneumothorax. Small linear densities in medial left lower lung field may suggest scarring. There is no pleural effusion or pneumothorax. IMPRESSION: There are no signs of pulmonary edema or focal pulmonary consolidation. Electronically Signed   By: Ernie Avena M.D.   On: 10/27/2022 16:19    IMPRESSION/PLAN:  62 year old female admitted to the hospital 10/27/2022 with nausea, generalized abdominal pain and hematochezia.  WBC 8.9.  Hemoglobin 14.5 -> 12.5.  CTA without evidence of active GI bleeding and focal narrowing in the proximal right external iliac artery and cholelithiasis were identified. Colonoscopy 12/2021 due to prior  admission for hematochezia showed a hyperplastic rectal polyp and colon biopsies  were negative for ischemic colitis/IBD.  -Clear liquid diet -Defer endoscopic recommendations to Dr. Leonides Schanz -CBC in a.m. -Continue to monitor the patient closely for active GI bleeding -Transfuse for hemoglobin less than 7 or as needed if symptomatic -Pain management per the hospitalist -Ondansetron 4 mg p.o. or IV every 6 hours as needed -Check C. difficile PCR and GI pathogen panel if patient continues to have diarrhea  Lactic acidosis in setting of hypotension/GI bleed, lactic acid levels normalized  ESRD on HD.  Last dialysis session was 6/19.  History of nonbleeding gastric and duodenal ulcers per EGD 12/2021 treated with PPI twice daily, repeat EGD in 8 to 10 weeks to assess for ulcer healing was recommended but was not done. -Consider EGD during this hospital admission, await further recommendations per Dr. Leonides Schanz -PPI IV twice daily  Hyperplastic rectal polyp per colonoscopy 12/2021  History of Lupus on Hydroxychloroquine  Arnaldo Natal  10/28/2022, 8:49AM

## 2022-10-28 NOTE — ED Notes (Signed)
ED TO INPATIENT HANDOFF REPORT  ED Nurse Name and Phone #: [phone]  S Name/Age/Gender Kathleen [name] 62 y.o. female Room/Bed: 046C/046C  Code Status   Code Status: Full Code  Home/SNF/Other Home Patient oriented to: self, place, time, and situation Is this baseline? Yes   Triage Complete: Triage complete  Chief Complaint Acute GI bleeding [K92.2]  Triage Note PT BIB EMS for rectal bleeding X 5 days, abdominal pain, dialysis yesterday, history of gastric ulcers.    79/57 HR 100 O2 100 CBG 111  Similar event April, patient had to have a blood transfusion.    Allergies No Known Allergies  Level of Care/Admitting Diagnosis ED Disposition     ED Disposition  Admit   Condition  --   Comment  Hospital Area: [facility] [[id number]]  Level of Care: Progressive [102]  Admit to Progressive based on following criteria: GI, ENDOCRINE disease patients with GI bleeding, acute liver failure or pancreatitis, stable with diabetic ketoacidosis or thyrotoxicosis (hypothyroid) state.  May place patient in observation at [facility] or [facility] if equivalent level of care is available:: No  Covid Evaluation: Asymptomatic - no recent exposure (last 10 days) testing not required  Diagnosis: Acute GI bleeding [147829]  Admitting Physician: Vonna Drafts [[id number]]  Attending Physician: Westley Chandler [[id number]]          B Medical/Surgery History Past Medical History:  Diagnosis Date   Anemia    Arthritis    Cardiac arrest (HCC)    Cellulitis    Cerebral vasculitis    COPD (chronic obstructive pulmonary disease) (HCC)    Elevated antinuclear antibody (ANA) level    Endometriosis    ESRD (end stage renal disease) on dialysis (HCC)    Gallstones    Hypertension    Lupus (HCC)    Migraine    PONV (postoperative nausea and vomiting)    Seizures (HCC)    Sepsis (HCC)    Sleep apnea    Suicide attempt (HCC)    TB (tuberculosis)    Transient ischemic  attack (TIA)    Vasculitis of skin    Past Surgical History:  Procedure Laterality Date   AV FISTULA PLACEMENT Left 07/04/2017   Procedure: ARTERIOVENOUS (AV) FISTULA CREATION;  Surgeon: Fransisco Hertz, MD;  Location: [facility] OR;  Service: Vascular;  Laterality: Left;   AV FISTULA PLACEMENT Left 11/27/2019   Procedure: INSERTION OF ARTERIOVENOUS (AV) GORE-TEX GRAFT LEFT UPPER ARM;  Surgeon: Chuck Hint, MD;  Location: [facility] OR;  Service: Vascular;  Laterality: Left;   AV FISTULA PLACEMENT Left 07/29/2020   Procedure: INSERTION OF LEFT UPPER ARM ARTERIOVENOUS (AV) GORE-TEX GRAFT;  Surgeon: Chuck Hint, MD;  Location: [facility] OR;  Service: Vascular;  Laterality: Left;   BIOPSY  12/23/2021   Procedure: BIOPSY;  Surgeon: Tressia Danas, MD;  Location: [facility] ENDOSCOPY;  Service: Gastroenterology;;   BUBBLE STUDY  05/25/2021   Procedure: BUBBLE STUDY;  Surgeon: Sande Rives, MD;  Location: [facility] ENDOSCOPY;  Service: Cardiovascular;;   CESAREAN SECTION     COLONOSCOPY WITH PROPOFOL N/A 12/23/2021   Procedure: COLONOSCOPY WITH PROPOFOL;  Surgeon: Tressia Danas, MD;  Location: [facility] ENDOSCOPY;  Service: Gastroenterology;  Laterality: N/A;   ESOPHAGOGASTRODUODENOSCOPY N/A 07/15/2016   Procedure: ESOPHAGOGASTRODUODENOSCOPY (EGD);  Surgeon: Carman Ching, MD;  Location: [facility] ENDOSCOPY;  Service: Endoscopy;  Laterality: N/A;   ESOPHAGOGASTRODUODENOSCOPY N/A 12/23/2021   Procedure: ESOPHAGOGASTRODUODENOSCOPY (EGD);  Surgeon: Tressia Danas, MD;  Location: [facility] ENDOSCOPY;  Service: Gastroenterology;  Laterality: N/A;   I & D EXTREMITY Left 05/27/2021   Procedure: IRRIGATION AND DEBRIDEMENT OF WRIST;  Surgeon: Bradly Bienenstock, MD;  Location: [facility] OR;  Service: Orthopedics;  Laterality: Left;   INSERTION OF DIALYSIS CATHETER Right 07/04/2017   Procedure: INSERTION OF TUNNELED  DIALYSIS CATHETER - RIGHT INTERNAL JUGULAR PLACEMENT;  Surgeon: Fransisco Hertz, MD;  Location: [facility] OR;  Service: Vascular;  Laterality:  Right;   IR FLUORO GUIDE CV LINE RIGHT  09/08/2021   IR THROMBECTOMY AV FISTULA W/THROMBOLYSIS/PTA INC/SHUNT/IMG LEFT Left 09/10/2021   IR US GUIDE VASC ACCESS LEFT  09/10/2021   IR US GUIDE VASC ACCESS RIGHT  09/08/2021   LUNG BIOPSY     POLYPECTOMY  12/23/2021   Procedure: POLYPECTOMY;  Surgeon: Tressia Danas, MD;  Location: [facility] ENDOSCOPY;  Service: Gastroenterology;;   TEE WITHOUT CARDIOVERSION N/A 05/25/2021   Procedure: TRANSESOPHAGEAL ECHOCARDIOGRAM (TEE);  Surgeon: Sande Rives, MD;  Location: [facility] ENDOSCOPY;  Service: Cardiovascular;  Laterality: N/A;   TUBAL LIGATION       A IV Location/Drains/Wounds Patient Lines/Drains/Airways Status     Active Line/Drains/Airways     Name Placement date Placement time Site Days   Peripheral IV 10/27/22 20 G 1.16" Anterior;Proximal;Right Forearm 10/27/22  1443  Forearm  1   Peripheral IV 10/28/22 Posterior;Right Wrist 10/28/22  0027  Wrist  less than 1   Fistula / Graft Left Upper arm Arteriovenous vein graft 05/16/21  1535  Upper arm  530   Fistula / Graft Left 12/24/21  1000  --  308   Hemodialysis Catheter Right Internal jugular Double lumen Permanent (Tunneled) 09/08/21  1149  Internal jugular  415   Incision (Closed) 07/29/20 Arm Left 07/29/20  1134  -- 821   Incision (Closed) 05/27/21 Arm Left 05/27/21  1859  -- 519   Wound / Incision (Open or Dehisced) 05/16/21 (MASD) Moisture Associated Skin Damage Thigh Anterior;Proximal;Right 05/16/21  1540  Thigh  530   Wound / Incision (Open or Dehisced) 05/16/21 (MASD) Moisture Associated Skin Damage Buttocks Right;Left 05/16/21  1530  Buttocks  530            Intake/Output Last 24 hours  Intake/Output Summary (Last 24 hours) at 10/28/2022 1050 Last data filed at 10/28/2022 0418 Gross per 24 hour  Intake 2009.12 ml  Output --  Net 2009.12 ml    Labs/Imaging Results for orders placed or performed during the hospital encounter of 10/27/22 (from the past 48 hour(s))  Comprehensive  metabolic panel     Status: Abnormal   Collection Time: 10/27/22  2:45 PM  Result Value Ref Range   Sodium 139 135 - 145 mmol/L   Potassium 4.2 3.5 - 5.1 mmol/L   Chloride 91 (L) 98 - 111 mmol/L   CO2 29 22 - 32 mmol/L   Glucose, Bld 105 (H) 70 - 99 mg/dL    Comment: Glucose reference range applies only to samples taken after fasting for at least 8 hours.   BUN 18 8 - 23 mg/dL   Creatinine, Ser 9.62 (H) 0.44 - 1.00 mg/dL   Calcium 95.2 (H) 8.9 - 10.3 mg/dL   Total Protein 9.9 (H) 6.5 - 8.1 g/dL   Albumin 4.4 3.5 - 5.0 g/dL   AST 33 15 - 41 U/L   ALT 14 0 - 44 U/L   Alkaline Phosphatase 83 38 - 126 U/L   Total Bilirubin 0.5 0.3 - 1.2 mg/dL   GFR, Estimated 4 (L) >60 mL/min  Comment: (NOTE) Calculated using the CKD-EPI Creatinine Equation (2021)    Anion gap 19 (H) 5 - 15    Comment: Performed at [facility] Lab, [address] [place], [place] [address]  Lipase, blood     Status: None   Collection Time: 10/27/22  2:45 PM  Result Value Ref Range   Lipase 46 11 - 51 U/L    Comment: Performed at [facility] Lab, [address] [place], [place] [address]  CBC with Diff     Status: Abnormal   Collection Time: 10/27/22  2:45 PM  Result Value Ref Range   WBC 9.4 4.0 - 10.5 K/uL   RBC 4.68 3.87 - 5.11 MIL/uL   Hemoglobin 14.5 12.0 - 15.0 g/dL   HCT 09.8 (H) 11.9 - 14.7 %   MCV 101.7 (H) 80.0 - 100.0 fL   MCH 31.0 26.0 - 34.0 pg   MCHC 30.5 30.0 - 36.0 g/dL   RDW 82.9 (H) 56.2 - 13.0 %   Platelets 233 150 - 400 K/uL   nRBC 1.1 (H) 0.0 - 0.2 %   Neutrophils Relative % 67 %   Neutro Abs 6.2 1.7 - 7.7 K/uL   Lymphocytes Relative 21 %   Lymphs Abs 2.0 0.7 - 4.0 K/uL   Monocytes Relative 9 %   Monocytes Absolute 0.9 0.1 - 1.0 K/uL   Eosinophils Relative 2 %   Eosinophils Absolute 0.2 0.0 - 0.5 K/uL   Basophils Relative 1 %   Basophils Absolute 0.1 0.0 - 0.1 K/uL   Immature Granulocytes 0 %   Abs Immature Granulocytes 0.02 0.00 - 0.07 K/uL    Comment: Performed at  [facility] Lab, [address] [place], [place] [address]  Lactic acid, plasma     Status: Abnormal   Collection Time: 10/27/22  2:45 PM  Result Value Ref Range   Lactic Acid, Venous 3.9 (HH) 0.5 - 1.9 mmol/L    Comment: CRITICAL RESULT CALLED TO, READ BACK BY AND VERIFIED WITH Jaci Lazier, RN @ [phone] 10/27/22 BY [facility] Performed at [facility] Lab, [address], [place], [place] [address]   Protime-INR     Status: None   Collection Time: 10/27/22  2:45 PM  Result Value Ref Range   Prothrombin Time 13.9 11.4 - 15.2 seconds   INR 1.1 0.8 - 1.2    Comment: (NOTE) INR goal varies based on device and disease states. Performed at [facility] Lab, [address], [place], [place] [address]   Magnesium     Status: Abnormal   Collection Time: 10/27/22  2:45 PM  Result Value Ref Range   Magnesium 2.8 (H) 1.7 - 2.4 mg/dL    Comment: Performed at [facility] Lab, [address], [place], [place] [address]  Troponin I (High Sensitivity)     Status: Abnormal   Collection Time: 10/27/22  2:45 PM  Result Value Ref Range   Troponin I (High Sensitivity) 23 (H) <18 ng/L    Comment: (NOTE) Elevated high sensitivity troponin I (hsTnI) values and significant  changes across serial measurements may suggest ACS but many other  chronic and acute conditions are known to elevate hsTnI results.  Refer to the "Links" section for chest pain algorithms and additional  guidance. Performed at [facility] Lab, [address], [place], [place] [address]   Type and screen [facility]     Status: None   Collection Time: 10/27/22  2:45 PM  Result Value Ref Range   ABO/RH(D) B POS    Antibody Screen NEG    Sample Expiration      10/30/2022,2359 Performed at [facility] Lab, [address] [place], [place] [address]   I-stat chem 8, ED (not at [facility] or [facility])     Status: Abnormal   Collection Time: 10/27/22  3:23 PM  Result Value Ref Range   Sodium 141 135 - 145  mmol/L   Potassium 4.2 3.5 - 5.1 mmol/L   Chloride 97 (L) 98 - 111 mmol/L   BUN 20 8 - 23 mg/dL   Creatinine, Ser 1.91 (H) 0.44 - 1.00 mg/dL   Glucose, Bld 478 (H) 70 - 99 mg/dL    Comment: Glucose reference range applies only to samples taken after fasting for at least 8 hours.   Calcium, Ion 1.18 1.15 - 1.40 mmol/L   TCO2 33 (H) 22 - 32 mmol/L   Hemoglobin 17.0 (H) 12.0 - 15.0 g/dL   HCT 29.5 (H) 62.1 - 30.8 %  Troponin I (High Sensitivity)     Status: Abnormal   Collection Time: 10/27/22  4:36 PM  Result Value Ref Range   Troponin I (High Sensitivity) 21 (H) <18 ng/L    Comment: (NOTE) Elevated high sensitivity troponin I (hsTnI) values and significant  changes across serial measurements may suggest ACS but many other  chronic and acute conditions are known to elevate hsTnI results.  Refer to the "Links" section for chest pain algorithms and additional  guidance. Performed at [facility] Lab, [address], [place], [place] [address]   Lactic acid, plasma     Status: Abnormal   Collection Time: 10/27/22  7:00 PM  Result Value Ref Range   Lactic Acid, Venous 2.5 (HH) 0.5 - 1.9 mmol/L    Comment: CRITICAL VALUE NOTED. VALUE IS CONSISTENT WITH PREVIOUSLY REPORTED/CALLED VALUE Performed at [facility] Lab, [address] [place], [place] [address]   HIV Antibody (routine testing w rflx)     Status: None   Collection Time: 10/27/22 11:38 PM  Result Value Ref Range   HIV Screen 4th Generation wRfx Non Reactive Non Reactive    Comment: Performed at [facility] Lab, [address] [place], [place] [address]  Lactic acid, plasma     Status: None   Collection Time: 10/27/22 11:38 PM  Result Value Ref Range   Lactic Acid, Venous 1.5 0.5 - 1.9 mmol/L    Comment: Performed at [facility] Lab, [address], [place], [place] [address]  CBC     Status: Abnormal   Collection Time: 10/28/22  1:32 AM  Result Value Ref Range   WBC 8.9 4.0 - 10.5 K/uL   RBC 4.22 3.87 -  5.11 MIL/uL   Hemoglobin 12.5 12.0 - 15.0 g/dL   HCT 10.2 72.5 - 36.6 %   MCV 99.1 80.0 - 100.0 fL   MCH 29.6 26.0 - 34.0 pg   MCHC 29.9 (L) 30.0 - 36.0 g/dL   RDW 44.0 (H) 34.7 - 42.5 %   Platelets 210 150 - 400 K/uL   nRBC 0.6 (H) 0.0 - 0.2 %    Comment: Performed at [facility] Lab, [address], [place], [place] [address]  Protime-INR     Status: None   Collection Time: 10/28/22  1:32 AM  Result Value Ref Range   Prothrombin Time 14.8 11.4 - 15.2 seconds   INR 1.1  0.8 - 1.2    Comment: (NOTE) INR goal varies based on device and disease states. Performed at [facility], [address], [place], [place] [address]   APTT     Status: None   Collection Time: 10/28/22  1:32 AM  Result Value Ref Range   aPTT 29 24 - 36 seconds    Comment: Performed at [facility] Lab, [address], [place], [place] [address]  Renal function panel     Status: Abnormal   Collection Time: 10/28/22  1:32 AM  Result Value Ref Range   Sodium 136 135 - 145 mmol/L   Potassium 4.0 3.5 - 5.1 mmol/L   Chloride 95 (L) 98 - 111 mmol/L   CO2 25 22 - 32 mmol/L   Glucose, Bld 87 70 - 99 mg/dL    Comment: Glucose reference range applies only to samples taken after fasting for at least 8 hours.   BUN 22 8 - 23 mg/dL   Creatinine, Ser 09.81 (H) 0.44 - 1.00 mg/dL   Calcium 9.3 8.9 - 19.1 mg/dL   Phosphorus 5.7 (H) 2.5 - 4.6 mg/dL   Albumin 3.3 (L) 3.5 - 5.0 g/dL   GFR, Estimated 4 (L) >60 mL/min    Comment: (NOTE) Calculated using the CKD-EPI Creatinine Equation (2021)    Anion gap 16 (H) 5 - 15    Comment: Performed at [facility] Lab, [address], [place], [place] [address]   CT Angio Abd/Pel W and/or Wo Contrast  Result Date: 10/27/2022 CLINICAL DATA:  Lower GI bleed EXAM: CTA ABDOMEN AND PELVIS WITHOUT AND WITH CONTRAST TECHNIQUE: Multidetector CT imaging of the abdomen and pelvis was performed using the standard protocol during bolus administration of intravenous contrast.  Multiplanar reconstructed images and MIPs were obtained and reviewed to evaluate the vascular anatomy. RADIATION DOSE REDUCTION: This exam was performed according to the departmental dose-optimization program which includes automated exposure control, adjustment of the mA and/or kV according to patient size and/or use of iterative reconstruction technique. CONTRAST:  75mL OMNIPAQUE IOHEXOL 350 MG/ML SOLN COMPARISON:  12/20/2021, 08/21/2022 FINDINGS: VASCULAR Aorta: Atherosclerotic calcifications are noted without aneurysmal dilatation. No evidence of dissection is seen. Some infrarenal aortic ulcerations are seen without complicating factors. Celiac: Patent without evidence of aneurysm, dissection, vasculitis or significant stenosis. SMA: Patent without evidence of aneurysm, dissection, vasculitis or significant stenosis. Renals: The renal arteries are diminutive bilaterally IMA: Diminutive but patent Inflow: Iliacs demonstrate atherosclerotic calcification with some complex focal narrowing in the proximal right external iliac artery. Veins: No specific venous abnormality is noted. Review of the MIP images confirms the above findings. NON-VASCULAR Lower chest: Visualized lung bases demonstrate bilateral scarring similar to that seen on prior exam. Hepatobiliary: Liver is within normal limits. Gallbladder is well distended with multiple gallstones. No wall thickening or pericholecystic fluid is noted. Pancreas: Unremarkable. No pancreatic ductal dilatation or surrounding inflammatory changes. Spleen: Normal in size without focal abnormality. Adrenals/Urinary Tract: Adrenal glands are within normal limits. Kidneys are atrophic bilaterally. No obstructive changes are noted. The bladder is decompressed. Stomach/Bowel: The appendix is within normal limits. No obstructive or inflammatory changes of the colon or small bowel are seen. No areas of active extravasation are identified to correspond with the given clinical  history of GI hemorrhage. Stomach is within normal limits. Lymphatic: No significant lymphadenopathy is seen. Reproductive: Uterus and bilateral adnexa are unremarkable. Other: No abdominal wall hernia or abnormality. No abdominopelvic ascites. Musculoskeletal: No acute or significant osseous  findings. IMPRESSION: VASCULAR Atherosclerotic calcifications are noted. Focal narrowing in the proximal right external iliac artery is noted. No aneurysmal dilatation or dissection is seen. The renal arteries are diminutive bilaterally with associated renal atrophy. No other vascular abnormality is seen. NON-VASCULAR Cholelithiasis without complicating factors. No findings to suggest active GI hemorrhage. Electronically Signed   By: Alcide Clever M.D.   On: 10/27/2022 20:00   DG Chest Portable 1 View  Result Date: 10/27/2022 CLINICAL DATA:  Abdominal pain, vomiting, rectal bleeding EXAM: PORTABLE CHEST 1 VIEW COMPARISON:  12/20/2021 FINDINGS: Transverse diameter of heart is increased. There are no signs of pulmonary edema or focal pulmonary consolidation. There is no pleural effusion or pneumothorax. Small linear densities in medial left lower lung field may suggest scarring. There is no pleural effusion or pneumothorax. IMPRESSION: There are no signs of pulmonary edema or focal pulmonary consolidation. Electronically Signed   By: Ernie Avena M.D.   On: 10/27/2022 16:19    Pending Labs Unresulted Labs (From admission, onward)     Start     Ordered   10/29/22 0500  Basic metabolic panel  Tomorrow morning,   R        10/28/22 1023   10/29/22 0500  Magnesium  Tomorrow morning,   R        10/28/22 1023   10/29/22 0500  CBC  Tomorrow morning,   R        10/28/22 1023   10/28/22 1700  CBC  Once,   R        10/28/22 1023   10/28/22 1010  Hepatitis B surface antigen  (New Admission Hemo Labs (Hepatitis B))  Once,   R        10/28/22 1015   10/28/22 1010  Hepatitis B surface antibody,quantitative  (New  Admission Hemo Labs (Hepatitis B))  Once,   R        10/28/22 1015   10/28/22 0500  Renal function panel  Daily,   R      10/27/22 2139            Vitals/Pain Today's Vitals   10/28/22 0815 10/28/22 0816 10/28/22 1000 10/28/22 1010  BP: (!) 105/91  103/88 95/75  Pulse:      Resp: 12  18 18   Temp:      TempSrc:      SpO2:      Weight:      Height:      PainSc:  2       Isolation Precautions No active isolations  Medications Medications  sodium chloride flush (NS) 0.9 % injection 3 mL (3 mLs Intravenous Given 10/28/22 0934)  pantoprazole (PROTONIX) injection 40 mg (40 mg Intravenous Given 10/28/22 0934)  Chlorhexidine Gluconate Cloth 2 % PADS 6 each (has no administration in time range)  metoCLOPramide (REGLAN) injection 10 mg (10 mg Intravenous Given 10/27/22 1555)  lactated ringers bolus 500 mL (0 mLs Intravenous Stopped 10/27/22 1712)  fentaNYL (SUBLIMAZE) injection 50 mcg (50 mcg Intravenous Given 10/27/22 1559)  iohexol (OMNIPAQUE) 350 MG/ML injection 75 mL (75 mLs Intravenous Contrast Given 10/27/22 1940)  sodium chloride 0.9 % bolus 500 mL (0 mLs Intravenous Stopped 10/27/22 2206)  midodrine (PROAMATINE) tablet 5 mg (5 mg Oral Given 10/27/22 2327)  midodrine (PROAMATINE) tablet 5 mg (5 mg Oral Given 10/28/22 0216)  sodium chloride 0.9 % bolus 1,000 mL (0 mLs Intravenous Stopped 10/28/22 0418)    Mobility walks with person assist     Focused  Assessments Renal Assessment Handoff:  Hemodialysis Schedule: Hemodialysis Schedule: Tuesday/Thursday/Saturday Last Hemodialysis date and time:    Restricted appendage: left arm   R Recommendations: See Admitting Provider Note  Report given to:   Additional Notes:

## 2022-10-28 NOTE — Progress Notes (Signed)
Pt receives out-pt HD at [facility] on TTS. Will assist as needed.   Thelmer Legler Renal Navigator [phone] 

## 2022-10-28 NOTE — Evaluation (Signed)
Occupational Therapy Evaluation Patient Details Name: Kathleen [name] MRN: [id number] DOB: 01/31/1961 Today's Date: 10/28/2022   History of Present Illness Pt is a 62 y.o. female who following dialysis yesterday and her BP was soft. She felt weak and was sent to the ED for further evaluation. Pt reports hematochezia about 2 weeks ago with the onset of nausea with generalized abdominal pain 2 to 3 days ago. CTA without evidence of active GI bleeding and focal narrowing in the proximal right external iliac artery and cholelithiasis were identified. PMH significant for ESRD on HD, COPD, HTN, migraine, lupus, seziures, anemia, cardiac arrest hx, cellulitis, cerebral vasculitis, hx of suicide attempt, TB, TIA, Covid 19.   Clinical Impression   Pt currently independent for simulated selfcare tasks sit to stand and functional transfers without use of DME or assistive device.  No LOB when ambulating with OT or with retrieving items from the floor.  Pt's BP in supine with HOB slightly raised at 87/62 increasing to 97/67 in sitting and then 113/73 in standing. Pt with no report of dizziness. Unsure of discharge plan but feel pt is baseline for ADL performance and does not warrant any further acute or post acute OT needs or OT DME at this time.        Recommendations for follow up therapy are one component of a multi-disciplinary discharge planning process, led by the attending physician.  Recommendations may be updated based on patient status, additional functional criteria and insurance authorization.   Assistance Recommended at Discharge PRN  Patient can return home with the following Assist for transportation    Functional Status Assessment  Patient has not had a recent decline in their functional status  Equipment Recommendations  None recommended by OT       Precautions / Restrictions Precautions Precautions: Other (comment) Precaution Comments: watch BP; Lt arm fistula  dialysis Restrictions Weight Bearing Restrictions: No      Mobility Bed Mobility Overal bed mobility: Independent Bed Mobility: Supine to Sit, Sit to Supine     Supine to sit: Independent Sit to supine: Independent        Transfers Overall transfer level: Independent Equipment used: None                      Balance Overall balance assessment: Mild deficits observed, not formally tested                                         ADL either performed or assessed with clinical judgement   ADL Overall ADL's : Modified independent;At baseline                                       General ADL Comments: Pt able to complete simulated dressing, toilet transfers, functional mobility and retrieval of items from the floor without LOB during session.  Pt's BP in supine with HOB slightly raised at 87/62 increasing to 97/67 in sitting and then 113/73 in standing.  Pt with no report of dizziness.     Vision Baseline Vision/History: 1 Wears glasses (did not have them with her) Ability to See in Adequate Light: 0 Adequate Patient Visual Report: No change from baseline Vision Assessment?: No apparent visual deficits Additional Comments: Pt reports no vision changes.  She is able to  read the clock without difficulty at a distance and room numbers close and far in the hallway.     Perception  WFL   Praxis  [facility]    Pertinent Vitals/Pain Pain Assessment Pain Assessment: No/denies pain     Hand Dominance Right   Extremity/Trunk Assessment Upper Extremity Assessment Upper Extremity Assessment: Overall WFL for tasks assessed   Lower Extremity Assessment Lower Extremity Assessment: Defer to PT evaluation   Cervical / Trunk Assessment Cervical / Trunk Assessment: Normal   Communication Communication Communication: No difficulties   Cognition Arousal/Alertness: Awake/alert Behavior During Therapy: WFL for tasks assessed/performed Overall  Cognitive Status: Impaired/Different from baseline                                 General Comments: Pt follows commands consistently.  Able to state place, day of week, location and reason for being in the hospital.  She was able to recall her room number after 2 mins while ambulating in the hallway.                Home Living Family/patient expects to be discharged to:: Shelter/Homeless                                 Additional Comments: Has been staying with a friend and and helping her. sleeping in her car as she has been unable to stay in motel due to cost; T/R/Sat dialysis (gave her walker to her friend who had knee surgery)      Prior Functioning/Environment Prior Level of Function : Independent/Modified Independent;Driving                         AM-PAC OT "6 Clicks" Daily Activity     Outcome Measure Help from another person eating meals?: None Help from another person taking care of personal grooming?: None Help from another person toileting, which includes using toliet, bedpan, or urinal?: None Help from another person bathing (including washing, rinsing, drying)?: None Help from another person to put on and taking off regular upper body clothing?: None Help from another person to put on and taking off regular lower body clothing?: None 6 Click Score: 24   End of Session Nurse Communication: Mobility status;Other (comment) (BP readings)  Activity Tolerance: Patient tolerated treatment well Patient left: in bed;with call bell/phone within reach                   Time: 1126-1149 OT Time Calculation (min): 23 min Charges:  OT General Charges $OT Visit: 1 Visit OT Evaluation $OT Eval Low Complexity: 1 Low OT Treatments $Self Care/Home Management : 8-22 mins Perrin Maltese, OTR/L Acute Rehabilitation Services  Office [phone] 10/28/2022

## 2022-10-28 NOTE — Evaluation (Signed)
Physical Therapy Evaluation Patient Details Name: Kathleen [name] MRN: [id number] DOB: [birth year] Today's Date: 10/28/2022  History of Present Illness  Pt is a 62 y.o. female who following dialysis yesterday and her BP was soft. She felt weak and was sent to the ED for further evaluation. Pt reports hematochezia about 2 weeks ago with the onset of nausea with generalized abdominal pain 2 to 3 days ago. CTA without evidence of active GI bleeding and focal narrowing in the proximal right external iliac artery and cholelithiasis were identified. PMH significant for ESRD on HD, COPD, HTN, migraine, lupus, seziures, anemia, cardiac arrest hx, cellulitis, cerebral vasculitis, hx of suicide attempt, TB, TIA, Covid 19.   Clinical Impression  Kathleen [name] is 62 y.o. female admitted with above HPI and diagnosis. Patient is currently limited by functional impairments below (see PT problem list). Patient lives with a friend and is independent with no AD for mobility at baseline. Currently pt mobilizing well at Mod I level with transfers and sup/mod I with RW for gait. At baseline pt does not use AD. Anticipate no follow up PT needed at discharge, will follow acutely for HEP progression. Acute PT will follow and progress as able.        Recommendations for follow up therapy are one component of a multi-disciplinary discharge planning process, led by the attending physician.  Recommendations may be updated based on patient status, additional functional criteria and insurance authorization.  Follow Up Recommendations       Assistance Recommended at Discharge PRN  Patient can return home with the following       Equipment Recommendations None recommended by PT  Recommendations for Other Services       Functional Status Assessment Patient has had a recent decline in their functional status and demonstrates the ability to make significant improvements in function in a reasonable and predictable  amount of time.     Precautions / Restrictions Precautions Precautions: Other (comment) Precaution Comments: watch BP; Lt arm fistula dialysis Restrictions Weight Bearing Restrictions: No      Mobility  Bed Mobility Overal bed mobility: Modified Independent       Supine to sit: Modified independent (Device/Increase time) Sit to supine: Modified independent (Device/Increase time)   General bed mobility comments: use of bed features    Transfers Overall transfer level: Modified independent Equipment used: Rolling walker (2 wheels)               General transfer comment: steady with rise, BP stable    Ambulation/Gait Ambulation/Gait assistance: Supervision, Modified independent (Device/Increase time) Gait Distance (Feet): 400 Feet Assistive device: Rolling walker (2 wheels) Gait Pattern/deviations: Step-through pattern, Decreased stride length Gait velocity: fair     General Gait Details: pt maintained safe proximity to RW and no buckling or LOB noted throughout. supervision fading to mod ind with walker.  Stairs            Wheelchair Mobility    Modified Rankin (Stroke Patients Only)       Balance Overall balance assessment: Mild deficits observed, not formally tested                                           Pertinent Vitals/Pain Pain Assessment Pain Assessment: No/denies pain    Home Living Family/patient expects to be discharged to:: Shelter/Homeless  Additional Comments: Has been staying with a friend and and helping her. sleeping in her car as she has been unable to stay in motel due to cost; T/R/Sat dialysis (gave her walker to her friend who had knee surgery)    Prior Function Prior Level of Function : Independent/Modified Independent;Driving                     Hand Dominance   Dominant Hand: Right    Extremity/Trunk Assessment   Upper Extremity Assessment Upper Extremity  Assessment: Overall WFL for tasks assessed    Lower Extremity Assessment Lower Extremity Assessment: Defer to PT evaluation    Cervical / Trunk Assessment Cervical / Trunk Assessment: Normal  Communication   Communication: No difficulties  Cognition Arousal/Alertness: Awake/alert Behavior During Therapy: WFL for tasks assessed/performed Overall Cognitive Status: No family/caregiver present to determine baseline cognitive functioning                                          General Comments      Exercises     Assessment/Plan    PT Assessment Patient needs continued PT services  PT Problem List Decreased strength;Decreased activity tolerance;Decreased balance;Decreased mobility;Decreased knowledge of use of DME;Decreased knowledge of precautions;Decreased safety awareness       PT Treatment Interventions DME instruction;Gait training;Stair training;Functional mobility training;Therapeutic activities;Therapeutic exercise;Balance training;Neuromuscular re-education;Cognitive remediation;Patient/family education    PT Goals (Current goals can be found in the Care Plan section)  Acute Rehab PT Goals Patient Stated Goal: get better and home PT Goal Formulation: With patient Time For Goal Achievement: 11/11/22 Potential to Achieve Goals: Good    Frequency Min 2X/week     Co-evaluation               AM-PAC PT "6 Clicks" Mobility  Outcome Measure Help needed turning from your back to your side while in a flat bed without using bedrails?: None Help needed moving from lying on your back to sitting on the side of a flat bed without using bedrails?: None Help needed moving to and from a bed to a chair (including a wheelchair)?: None Help needed standing up from a chair using your arms (e.g., wheelchair or bedside chair)?: None Help needed to walk in hospital room?: A Little Help needed climbing 3-5 steps with a railing? : A Little 6 Click Score: 22     End of Session Equipment Utilized During Treatment: Gait belt Activity Tolerance: Patient tolerated treatment well Patient left: in bed;with call bell/phone within reach Nurse Communication: Mobility status PT Visit Diagnosis: Unsteadiness on feet (R26.81);Other abnormalities of gait and mobility (R26.89);Difficulty in walking, not elsewhere classified (R26.2)    Time: 1610-9604 PT Time Calculation (min) (ACUTE ONLY): 26 min   Charges:   PT Evaluation $PT Eval Low Complexity: 1 Low PT Treatments $Gait Training: 8-22 mins       Wynn Maudlin, DPT Acute Rehabilitation Services Office [phone]  10/28/22 12:56 PM

## 2022-10-28 NOTE — Progress Notes (Signed)
FMTS Brief Progress Note  S:Resting in bed    O: BP 117/73 (BP Location: Right Arm)   Pulse 82   Temp 97.7 F (36.5 C) (Oral)   Resp 13   Ht 5\' 4"  (1.626 m)   Wt 48.1 kg   SpO2 97%   BMI 18.19 kg/m   Sleeping soundly, normal WOB  A/P: C Diff +, fidaxomicin started (no need for renal dosing) x10 days  HD per nephro  GI to see in AM Hgb stable  BP looks good  - Orders reviewed. Labs for AM ordered, which was adjusted as needed.    Alfredo Martinez, MD 10/28/2022, 11:29 PM PGY-2, [facility] Night Resident  Please page [phone] with questions.

## 2022-10-28 NOTE — Assessment & Plan Note (Signed)
3>9>2.5>1.5. 

## 2022-10-28 NOTE — ED Notes (Signed)
Pt placed on bedpan

## 2022-10-28 NOTE — Assessment & Plan Note (Addendum)
Lower GI bleed, has happened previously 12/2021. Hgb 14.5>12.5. Hypotensive on admission. S/p 1L bolus and midodrine (cautious with fluid resuscitation 2/2 ESRD)--will maintain MAP. Bps improved. - GI consulted, recs appreciated - AM BMP/CBC - PPI - Hold off on VTE prophylaxis - PT/OT -- Consult CCM for MAP <60

## 2022-10-28 NOTE — Progress Notes (Signed)
     Daily Progress Note Intern Pager: [phone]  Patient name: Kathleen [name] Medical record number: [id number] Date of birth: December 08, 1960 Age: 62 y.o. Gender: female  Primary Care Provider: Hildred Priest, NP Consultants: Nephrology, Gastroenterology Code Status: Full Code  Pt Overview and Major Events to Date:  -Admitted 10/27/22  Assessment and Plan:  Kathleen [name] is a 62 y.o. admitted for suspected acute GI bleed in the setting SLE, ESRD on HD, and prior peptic duodenitis. Vitals and hemoglobin remain stable, awaiting GI evaluation.  Hospital Problem List      Hospital     * (Principal) Acute GI bleeding     Lower GI bleed, has happened previously 12/2021. Hgb 14.5>12.5.  Hypotensive on admission. S/p 1L bolus and midodrine (cautious with fluid  resuscitation 2/2 ESRD)--will maintain MAP. Bps improved. - GI consulted, recs appreciated - AM BMP/CBC - PPI - Hold off on VTE prophylaxis - PT/OT -- Consult CCM for MAP <60         RESOLVED: Lactic acidosis     3>9>2.5>1.5.     Chronic and Stable  Lupus -- Hydroxychloroquine discontinued at prior admission HTN -- Hold hydral, amlodipine, coreg for now in setting of hypotension ESRD on HD -- Continue outpatient dialysis schedule inpatient, no uremia on admission, appreciate Nephro recs COPD -- monitor   FEN/GI: NPO PPx: SCDs Dispo:Pending clinical improvement  Subjective:  NAEO. Having active bloody bowel movements. Endorse some abdominal pain.  Objective: Temp:  [97.8 F (36.6 C)-98 F (36.7 C)] 98 F (36.7 C) (06/20 1133) Pulse Rate:  [76-100] 76 (06/20 1120) Resp:  [10-20] 18 (06/20 1120) BP: (71-126)/(49-91) 98/67 (06/20 1120) SpO2:  [99 %-100 %] 99 % (06/20 1120) Weight:  [48.1 kg] 48.1 kg (06/19 1430) Physical Exam: General: NAD  Neuro: A&O Cardiovascular: RRR, no murmurs, no peripheral edema Respiratory: normal WOB on RA, CTAB, no wheezes, ronchi or rales Abdomen: soft, mild TTP epigastric  area, no rebound or guarding Extremities: Moving all 4 extremities equally Stool: watery, bloody   Laboratory: Most recent CBC Lab Results  Component Value Date   WBC 8.9 10/28/2022   HGB 12.5 10/28/2022   HCT 41.8 10/28/2022   MCV 99.1 10/28/2022   PLT 210 10/28/2022   Most recent BMP    Latest Ref Rng & Units 10/28/2022    1:32 AM  BMP  Glucose 70 - 99 mg/dL 87   BUN 8 - 23 mg/dL 22   Creatinine 1.47 - 1.00 mg/dL 82.95   Sodium 621 - 308 mmol/L 136   Potassium 3.5 - 5.1 mmol/L 4.0   Chloride 98 - 111 mmol/L 95   CO2 22 - 32 mmol/L 25   Calcium 8.9 - 10.3 mg/dL 9.3     Imaging/Diagnostic Tests: No new imaging.  Celine Mans, MD 10/28/2022, 11:40 AM  PGY-1, [facility] FPTS Intern pager: [phone], text pages welcome Secure chat group [facility] Teaching Service

## 2022-10-29 ENCOUNTER — Other Ambulatory Visit (HOSPITAL_COMMUNITY): Payer: Self-pay

## 2022-10-29 ENCOUNTER — Telehealth: Payer: Self-pay

## 2022-10-29 DIAGNOSIS — A0472 Enterocolitis due to Clostridium difficile, not specified as recurrent: Secondary | ICD-10-CM | POA: Diagnosis not present

## 2022-10-29 DIAGNOSIS — K921 Melena: Secondary | ICD-10-CM

## 2022-10-29 DIAGNOSIS — R110 Nausea: Secondary | ICD-10-CM

## 2022-10-29 DIAGNOSIS — E871 Hypo-osmolality and hyponatremia: Secondary | ICD-10-CM

## 2022-10-29 DIAGNOSIS — Z992 Dependence on renal dialysis: Secondary | ICD-10-CM

## 2022-10-29 DIAGNOSIS — R109 Unspecified abdominal pain: Secondary | ICD-10-CM

## 2022-10-29 DIAGNOSIS — E876 Hypokalemia: Secondary | ICD-10-CM

## 2022-10-29 LAB — CBC
HCT: 35.8 % — ABNORMAL LOW (ref 36.0–46.0)
HCT: 39.5 % (ref 36.0–46.0)
Hemoglobin: 10.9 g/dL — ABNORMAL LOW (ref 12.0–15.0)
Hemoglobin: 11.9 g/dL — ABNORMAL LOW (ref 12.0–15.0)
MCH: 29.6 pg (ref 26.0–34.0)
MCH: 29.8 pg (ref 26.0–34.0)
MCHC: 30.4 g/dL (ref 30.0–36.0)
MCHC: 30.1 g/dL (ref 30.0–36.0)
MCV: 97.3 fL (ref 80.0–100.0)
MCV: 98.8 fL (ref 80.0–100.0)
Platelets: 161 10*3/uL (ref 150–400)
Platelets: 172 10*3/uL (ref 150–400)
RBC: 3.68 MIL/uL — ABNORMAL LOW (ref 3.87–5.11)
RBC: 4 MIL/uL (ref 3.87–5.11)
RDW: 16.2 % — ABNORMAL HIGH (ref 11.5–15.5)
RDW: 16.2 % — ABNORMAL HIGH (ref 11.5–15.5)
WBC: 7.2 10*3/uL (ref 4.0–10.5)
WBC: 5.8 10*3/uL (ref 4.0–10.5)
nRBC: 0.3 % — ABNORMAL HIGH (ref 0.0–0.2)
nRBC: 0 % (ref 0.0–0.2)

## 2022-10-29 LAB — RENAL FUNCTION PANEL
Albumin: 3 g/dL — ABNORMAL LOW (ref 3.5–5.0)
Anion gap: 13 (ref 5–15)
BUN: 11 mg/dL (ref 8–23)
CO2: 25 mmol/L (ref 22–32)
Calcium: 8.9 mg/dL (ref 8.9–10.3)
Chloride: 94 mmol/L — ABNORMAL LOW (ref 98–111)
Creatinine, Ser: 6.14 mg/dL — ABNORMAL HIGH (ref 0.44–1.00)
GFR, Estimated: 7 mL/min — ABNORMAL LOW (ref >60)
Glucose, Bld: 76 mg/dL (ref 70–99)
Phosphorus: 3.5 mg/dL (ref 2.5–4.6)
Potassium: 3.3 mmol/L — ABNORMAL LOW (ref 3.5–5.1)
Sodium: 132 mmol/L — ABNORMAL LOW (ref 135–145)

## 2022-10-29 LAB — GASTROINTESTINAL PANEL BY PCR, STOOL (REPLACES STOOL CULTURE)

## 2022-10-29 LAB — MAGNESIUM: Magnesium: 1.8 mg/dL (ref 1.7–2.4)

## 2022-10-29 LAB — HEPATITIS B SURFACE ANTIBODY, QUANTITATIVE: Hep B S AB Quant (Post): 16.5 m[IU]/mL (ref >9.9)

## 2022-10-29 LAB — IRON AND TIBC
Iron: 38 ug/dL (ref 28–170)
Saturation Ratios: 19 % (ref 10.4–31.8)
TIBC: 199 ug/dL — ABNORMAL LOW (ref 250–450)
UIBC: 161 ug/dL

## 2022-10-29 LAB — FERRITIN: Ferritin: 973 ng/mL — ABNORMAL HIGH (ref 11–307)

## 2022-10-29 MED ORDER — SEVELAMER CARBONATE 800 MG PO TABS
800.0000 mg | ORAL_TABLET | Freq: Three times a day (TID) | ORAL | Status: DC
  Administered 2022-10-29 – 2022-10-30 (×2): 800 mg via ORAL
  Filled 2022-10-29 (×2): qty 1

## 2022-10-29 MED ORDER — FIDAXOMICIN 200 MG PO TABS
200.0000 mg | ORAL_TABLET | Freq: Two times a day (BID) | ORAL | 0 refills | Status: AC
  Filled 2022-10-29: qty 19, 10d supply, fill #0

## 2022-10-29 MED ORDER — POTASSIUM CHLORIDE 20 MEQ PO PACK: 40.0000 meq | PACK | Freq: Once | ORAL | Status: DC

## 2022-10-29 NOTE — Progress Notes (Signed)
     Daily Progress Note Intern Pager: [phone]  Patient name: Kathleen [name] Medical record number: [id number] Date of birth: 05/13/1960 Age: 62 y.o. Gender: female  Primary Care Provider: Hildred Priest, NP Consultants: Nephrology, Gastroenterology Code Status: Full  Pt Overview and Major Events to Date:  -Admitted 10/27/22   Assessment and Plan: Kathleen [name] is a 62 y.o. admitted for suspected acute GI bleed in the setting SLE, ESRD on HD, and prior peptic duodenitis. Found to be C-diff positive.   Hospital Problem List      Hospital     * (Principal) Acute GI bleeding     VSS, BP remain stable. GI evaluated yesterday without current plans for  endoscopy. Hgb 11.9>10.9. While less diarrhea, Hgb keeps downtrending,  await GI evaluation. - GI consulted, recs appreciated - AM BMP/CBC - PPI BID - Hold off on VTE prophylaxis - PT/OT -- Consult CCM for MAP <60 -- EKG         ESRD (end stage renal disease) (HCC)     - Consult Nephrology - Appreciate their consultation and care - Restart renvala - Scheduled HD TThS        C. difficile colitis     GI evaluation positive for C-diff. Overnight started on fidaxomicin.  -Monitor I/Os -AM BMP, CBC -Fidaxomicin -GI consulted as above -Enteric precautions     Chronic and Stable  Lupus -- Hydroxychloroquine discontinued at prior admission HTN -- Hold hydral, amlodipine, coreg for now in setting of hypotension ESRD on HD -- Continue outpatient dialysis schedule inpatient, no uremia on admission, appreciate Nephro recs COPD -- monitor   FEN/GI: Clear fluid PPx: SCDs Dispo:Pending clinical improvement  Subjective:  NAEO. States there has been less diarrhea. However, still red. No lightheadedness or dizziness. Eating her liquid diet well.  Objective: Temp:  [97.4 F (36.3 C)-98.9 F (37.2 C)] 97.4 F (36.3 C) (06/21 1208) Pulse Rate:  [74-89] 74 (06/21 0447) Resp:  [12-19] 13 (06/21 1208) BP:  (83-136)/(62-83) 136/78 (06/21 1208) SpO2:  [90 %-100 %] 100 % (06/21 0447) Weight:  [50.9 kg] 50.9 kg (06/21 0451) Physical Exam: General: NAD, well appearing Neuro: A&O Cardiovascular: RRR, no murmurs, no peripheral edema Respiratory: normal WOB on RA, CTAB, no wheezes, ronchi or rales Abdomen: soft, NTTP, no rebound or guarding Extremities: Moving all 4 extremities equally, CR <2s   Laboratory: Most recent CBC Lab Results  Component Value Date   WBC 5.8 10/29/2022   HGB 11.9 (L) 10/29/2022   HCT 39.5 10/29/2022   MCV 98.8 10/29/2022   PLT 172 10/29/2022   Most recent BMP    Latest Ref Rng & Units 10/29/2022    1:43 AM  BMP  Glucose 70 - 99 mg/dL 76   BUN 8 - 23 mg/dL 11   Creatinine 1.47 - 1.00 mg/dL 8.29   Sodium 562 - 130 mmol/L 132   Potassium 3.5 - 5.1 mmol/L 3.3   Chloride 98 - 111 mmol/L 94   CO2 22 - 32 mmol/L 25   Calcium 8.9 - 10.3 mg/dL 8.9     Mag 1.8  Imaging/Diagnostic Tests: No new imaging.  Celine Mans, MD 10/29/2022, 2:07 PM  PGY-1, [facility] FPTS Intern pager: [phone], text pages welcome Secure chat group [facility] Teaching Service

## 2022-10-29 NOTE — Assessment & Plan Note (Addendum)
GI evaluation positive for C-diff. Overnight started on fidaxomicin.  -Monitor I/Os -AM BMP, CBC -Fidaxomicin -GI consulted as above -Enteric precautions

## 2022-10-29 NOTE — Progress Notes (Signed)
[facility] Progress Note  CC:  Nausea, abdominal pain and hematochezia    Subjective: No nausea or vomiting.  No abdominal pain this morning.  She passed small bits of loose stool or twice last night passing only gas per the rectum this morning.   Objective:  Vital signs in last 24 hours: Temp:  [97.7 F (36.5 C)-98.9 F (37.2 C)] 98 F (36.7 C) (06/21 0822) Pulse Rate:  [74-89] 74 (06/21 0447) Resp:  [12-19] 19 (06/21 0822) BP: (83-126)/(62-83) 92/66 (06/21 0822) SpO2:  [90 %-100 %] 100 % (06/21 0447) Weight:  [50.9 kg] 50.9 kg (06/21 0451) Last BM Date : 10/28/22 General: Alert 62 year old female in no acute distress. Heart: Regular rate and rhythm, no murmurs (LUE AV graft bruit radiates to heart). Pulm: Sounds clear. Abdomen: Nondistended.  Nontender.  Positive bowel sounds to all 4 quadrants. Extremities:  Without edema. Neurologic:  Alert and  oriented x 4. Grossly normal neurologically. Psych:  Alert and cooperative. Normal mood and affect.  Intake/Output from previous day: 06/20 0701 - 06/21 0700 In: 480 [P.O.:480] Out: 0  Intake/Output this shift: No intake/output data recorded.  Lab Results: Recent Labs    10/28/22 0132 10/28/22 1917 10/29/22 0142  WBC 8.9 7.5 7.2  HGB 12.5 11.9* 10.9*  HCT 41.8 39.3 35.8*  PLT 210 174 161   BMET Recent Labs    10/27/22 1445 10/27/22 1523 10/28/22 0132 10/29/22 0143  NA 139 141 136 132*  K 4.2 4.2 4.0 3.3*  CL 91* 97* 95* 94*  CO2 29  --  25 25  GLUCOSE 105* 104* 87 76  BUN 18 20 22 11   CREATININE 9.89* 9.40* 10.54* 6.14*  CALCIUM 10.5*  --  9.3 8.9   LFT Recent Labs    10/27/22 1445 10/28/22 0132 10/29/22 0143  PROT 9.9*  --   --   ALBUMIN 4.4   < > 3.0*  AST 33  --   --   ALT 14  --   --   ALKPHOS 83  --   --   BILITOT 0.5  --   --    < > = values in this interval not displayed.   PT/INR Recent Labs    10/27/22 1445 10/28/22 0132  LABPROT 13.9 14.8  INR 1.1 1.1    Hepatitis Panel Recent Labs    10/28/22 1345  HEPBSAG NON REACTIVE    CT Angio Abd/Pel W and/or Wo Contrast  Result Date: 10/27/2022 CLINICAL DATA:  Lower GI bleed EXAM: CTA ABDOMEN AND PELVIS WITHOUT AND WITH CONTRAST TECHNIQUE: Multidetector CT imaging of the abdomen and pelvis was performed using the standard protocol during bolus administration of intravenous contrast. Multiplanar reconstructed images and MIPs were obtained and reviewed to evaluate the vascular anatomy. RADIATION DOSE REDUCTION: This exam was performed according to the departmental dose-optimization program which includes automated exposure control, adjustment of the mA and/or kV according to patient size and/or use of iterative reconstruction technique. CONTRAST:  75mL OMNIPAQUE IOHEXOL 350 MG/ML SOLN COMPARISON:  12/20/2021, 08/21/2022 FINDINGS: VASCULAR Aorta: Atherosclerotic calcifications are noted without aneurysmal dilatation. No evidence of dissection is seen. Some infrarenal aortic ulcerations are seen without complicating factors. Celiac: Patent without evidence of aneurysm, dissection, vasculitis or significant stenosis. SMA: Patent without evidence of aneurysm, dissection, vasculitis or significant stenosis. Renals: The renal arteries are diminutive bilaterally IMA: Diminutive but patent Inflow: Iliacs demonstrate atherosclerotic calcification with some complex focal narrowing in the proximal right external iliac artery. Veins:  No specific venous abnormality is noted. Review of the MIP images confirms the above findings. NON-VASCULAR Lower chest: Visualized lung bases demonstrate bilateral scarring similar to that seen on prior exam. Hepatobiliary: Liver is within normal limits. Gallbladder is well distended with multiple gallstones. No wall thickening or pericholecystic fluid is noted. Pancreas: Unremarkable. No pancreatic ductal dilatation or surrounding inflammatory changes. Spleen: Normal in size without focal  abnormality. Adrenals/Urinary Tract: Adrenal glands are within normal limits. Kidneys are atrophic bilaterally. No obstructive changes are noted. The bladder is decompressed. Stomach/Bowel: The appendix is within normal limits. No obstructive or inflammatory changes of the colon or small bowel are seen. No areas of active extravasation are identified to correspond with the given clinical history of GI hemorrhage. Stomach is within normal limits. Lymphatic: No significant lymphadenopathy is seen. Reproductive: Uterus and bilateral adnexa are unremarkable. Other: No abdominal wall hernia or abnormality. No abdominopelvic ascites. Musculoskeletal: No acute or significant osseous findings. IMPRESSION: VASCULAR Atherosclerotic calcifications are noted. Focal narrowing in the proximal right external iliac artery is noted. No aneurysmal dilatation or dissection is seen. The renal arteries are diminutive bilaterally with associated renal atrophy. No other vascular abnormality is seen. NON-VASCULAR Cholelithiasis without complicating factors. No findings to suggest active GI hemorrhage. Electronically Signed   By: Alcide Clever M.D.   On: 10/27/2022 20:00   DG Chest Portable 1 View  Result Date: 10/27/2022 CLINICAL DATA:  Abdominal pain, vomiting, rectal bleeding EXAM: PORTABLE CHEST 1 VIEW COMPARISON:  12/20/2021 FINDINGS: Transverse diameter of heart is increased. There are no signs of pulmonary edema or focal pulmonary consolidation. There is no pleural effusion or pneumothorax. Small linear densities in medial left lower lung field may suggest scarring. There is no pleural effusion or pneumothorax. IMPRESSION: There are no signs of pulmonary edema or focal pulmonary consolidation. Electronically Signed   By: Ernie Avena M.D.   On: 10/27/2022 16:19    Assessment / Plan:  62 year old female admitted to the hospital 10/27/2022 with nausea, generalized abdominal pain, diarrhea and hematochezia. WBC 8.9.   Hemoglobin 14.5 -> 12.5 -> today Hg 10.9.  CTA without evidence of active GI bleeding and focal narrowing in the proximal right external iliac artery and cholelithiasis were identified. Colonoscopy 12/2021 due to prior admission for hematochezia showed a hyperplastic rectal polyp and colon biopsies were negative for ischemic colitis/IBD.  GI pathogen panel negative.  C. difficile antigen positive.  C. difficile PCR positive.  C. difficile toxin negative.  Started on Dificid.  No further diarrhea or hematochezia this morning. -Continue Dificid 200 mg 1 p.o. twice daily for 10 days -Renal diet -CBC in a.m. -Continue to monitor the patient closely for active GI bleeding -Transfuse for hemoglobin less than 7 or as needed if symptomatic -Pain management per the hospitalist -Ondansetron 4 mg p.o. or IV every 6 hours as needed   Lactic acidosis in setting of hypotension/GI bleed, lactic acid levels normalized   ESRD on HD.  Last dialysis session was 6/19.  Hyponatremia, hypokalemia -Management per the hospitalist/nephrology   History of nonbleeding gastric and duodenal ulcers per EGD 12/2021 treated with PPI twice daily, repeat EGD in 8 to 10 weeks to assess for ulcer healing was recommended but was not done. -Consider EGD during this hospital admission, await further recommendations per Dr. Leonides Schanz -PPI IV twice daily   Hyperplastic rectal polyp per colonoscopy 12/2021   History of Lupus on Hydroxychloroquine  Principal Problem:   Acute GI bleeding Active Problems:   ESRD (end stage  renal disease) (HCC)   C. difficile colitis     LOS: 1 day   Arnaldo Natal  10/29/2022, 11:04 AM

## 2022-10-29 NOTE — TOC Benefit Eligibility Note (Signed)
Pharmacy Patient Advocate Encounter  Insurance verification completed.    The patient is insured through [facility]    Ran test claim for Fidaxomicin and the current 30 day co-pay is $0.00.   This test claim was processed through [facility]- copay amounts may vary at other pharmacies due to pharmacy/plan contracts, or as the patient moves through the different stages of their insurance plan.

## 2022-10-29 NOTE — Progress Notes (Signed)
Mobility Specialist Progress Note:    10/29/22 1422  Mobility  Activity Ambulated with assistance in hallway  Level of Assistance Standby assist, set-up cues, supervision of patient - no hands on  Assistive Device None  Distance Ambulated (ft) 500 ft  Activity Response Tolerated well  Mobility Referral Yes  $Mobility charge 1 Mobility  Mobility Specialist Start Time (ACUTE ONLY) 1215  Mobility Specialist Stop Time (ACUTE ONLY) 1226  Mobility Specialist Time Calculation (min) (ACUTE ONLY) 11 min   Received pt ambulating in room having no complaints and agreeable to mobility. Pt was asymptomatic throughout ambulation and returned to room w/o fault. Left ambulating in hallway RN notified.   Thompson Grayer Mobility Specialist  Please contact vis Secure Chat or  Rehab Office [phone]

## 2022-10-29 NOTE — Assessment & Plan Note (Addendum)
VSS, BP remain stable. GI evaluated yesterday without current plans for endoscopy. Hgb 11.9>10.9. While less diarrhea, Hgb keeps downtrending, await GI evaluation. - GI consulted, recs appreciated - AM BMP/CBC - PPI BID - Hold off on VTE prophylaxis - PT/OT -- Consult CCM for MAP <60 -- EKG

## 2022-10-29 NOTE — Progress Notes (Signed)
[facility] Progress Note   Subjective: Seen in room. Completed dialysis yesterday -no UF. Still having some loose stools, but better than yesterday.   Objective Vitals:   10/28/22 2306 10/29/22 0447 10/29/22 0451 10/29/22 0822  BP: 117/73 121/83  92/66  Pulse: 82 74    Resp: 13 14  19   Temp: 97.7 F (36.5 C) 98.6 F (37 C)  98 F (36.7 C)  TempSrc: Oral Oral  Oral  SpO2: 97% 100%    Weight:   50.9 kg   Height:          Additional Objective Labs: Basic Metabolic Panel: Recent Labs  Lab 10/27/22 1445 10/27/22 1523 10/28/22 0132 10/29/22 0143  NA 139 141 136 132*  K 4.2 4.2 4.0 3.3*  CL 91* 97* 95* 94*  CO2 29  --  25 25  GLUCOSE 105* 104* 87 76  BUN 18 20 22 11   CREATININE 9.89* 9.40* 10.54* 6.14*  CALCIUM 10.5*  --  9.3 8.9  PHOS  --   --  5.7* 3.5   CBC: Recent Labs  Lab 10/27/22 1445 10/27/22 1523 10/28/22 0132 10/28/22 1917 10/29/22 0142  WBC 9.4  --  8.9 7.5 7.2  NEUTROABS 6.2  --   --   --   --   HGB 14.5   < > 12.5 11.9* 10.9*  HCT 47.6*   < > 41.8 39.3 35.8*  MCV 101.7*  --  99.1 100.3* 97.3  PLT 233  --  210 174 161   < > = values in this interval not displayed.   Blood Culture    Component Value Date/Time   SDES TISSUE WRIST LEFT SYNOVIAL 05/27/2021 1906   SDES WRIST LEFT SYNOVIAL 05/27/2021 1906   SPECREQUEST IN A CUP 05/27/2021 1906   SPECREQUEST SWABS 05/27/2021 1906   CULT  05/27/2021 1906    RARE PROPIONIBACTERIUM ACNES Standardized susceptibility testing for this organism is not available. Performed at [facility], [address] [place], [place] [address]    CULT  05/27/2021 1906    No growth aerobically or anaerobically. Performed at [facility], [address], [place], [place] [address]    REPTSTATUS 06/01/2021 FINAL 05/27/2021 1906   REPTSTATUS 06/01/2021 FINAL 05/27/2021 1906     Physical Exam General: Sitting up in bed, nad  Heart: RRR  Lungs: Clear bilaterally  Abdomen: soft  non-tender Extremities: no LE edema  Dialysis Access: L UE AVF +bruit   Medications:   Chlorhexidine Gluconate Cloth  6 each Topical Q0600   fidaxomicin  200 mg Oral BID   pantoprazole (PROTONIX) IV  40 mg Intravenous BID   potassium chloride  40 mEq Oral Once   sodium chloride flush  3 mL Intravenous Q12H    Dialysis Orders:  HP TTS 3:15 350/A1.5x  EDW 50.7 kg 2K/2Ca AVG Heparin 3600  -Mircera 75 q 2 wks - last 6/13  -Hectorol 3 q HD, Sensipar 60 q HD      Assessment/Plan: GIB/Hematochezia - h/o gastric/duodenal ulcers. GI consulted. Hgb12.5>11.9>10.9  C diff + - on fidaxomicin  Hypotension - likely secondary to bleeding/GI losses. Received 2 L IVF in the ED 6/20. Follow. H/o HTN - off BP meds for now  ESRD -  HD TTS. Continue on schedule. Next HD 6/22. Keep even, let weights drift up.  Anemia  -On ESA as outpatient. Follow trends.  Metabolic bone disease -  Continue home binders/Sensipar when eating.  Nutrition - On CL  diet   Tomasa Blase PA-C [facility] 10/29/2022,10:10 AM

## 2022-10-29 NOTE — Telephone Encounter (Signed)
Called patient to schedule a follow up visit in office per Dr Leonides Schanz request. Patient is schedule on 01/04/23 at 1:30 pm, patient is aware and verbalized understanding.

## 2022-10-29 NOTE — Plan of Care (Signed)

## 2022-10-30 ENCOUNTER — Other Ambulatory Visit (HOSPITAL_COMMUNITY): Payer: Self-pay

## 2022-10-30 DIAGNOSIS — A0472 Enterocolitis due to Clostridium difficile, not specified as recurrent: Secondary | ICD-10-CM | POA: Diagnosis not present

## 2022-10-30 LAB — RENAL FUNCTION PANEL
Albumin: 3.1 g/dL — ABNORMAL LOW (ref 3.5–5.0)
Anion gap: 16 — ABNORMAL HIGH (ref 5–15)
BUN: 22 mg/dL (ref 8–23)
CO2: 26 mmol/L (ref 22–32)
Calcium: 9.2 mg/dL (ref 8.9–10.3)
Chloride: 88 mmol/L — ABNORMAL LOW (ref 98–111)
Creatinine, Ser: 8.74 mg/dL — ABNORMAL HIGH (ref 0.44–1.00)
GFR, Estimated: 5 mL/min — ABNORMAL LOW (ref >60)
Glucose, Bld: 88 mg/dL (ref 70–99)
Phosphorus: 4 mg/dL (ref 2.5–4.6)
Potassium: 4 mmol/L (ref 3.5–5.1)
Sodium: 130 mmol/L — ABNORMAL LOW (ref 135–145)

## 2022-10-30 LAB — CBC
HCT: 32.8 % — ABNORMAL LOW (ref 36.0–46.0)
Hemoglobin: 10.2 g/dL — ABNORMAL LOW (ref 12.0–15.0)
MCH: 30.2 pg (ref 26.0–34.0)
MCHC: 31.1 g/dL (ref 30.0–36.0)
MCV: 97 fL (ref 80.0–100.0)
Platelets: 149 10*3/uL — ABNORMAL LOW (ref 150–400)
RBC: 3.38 MIL/uL — ABNORMAL LOW (ref 3.87–5.11)
RDW: 15.9 % — ABNORMAL HIGH (ref 11.5–15.5)
WBC: 5.1 10*3/uL (ref 4.0–10.5)
nRBC: 0 % (ref 0.0–0.2)

## 2022-10-30 MED ORDER — PANTOPRAZOLE SODIUM 40 MG PO TBEC
40.0000 mg | DELAYED_RELEASE_TABLET | Freq: Two times a day (BID) | ORAL | 0 refills | Status: AC
  Filled 2022-10-30: qty 60, 30d supply, fill #0

## 2022-10-30 NOTE — Assessment & Plan Note (Signed)
-   Nephro recs  - Scheduled HD TThS - Phos binder

## 2022-10-30 NOTE — Assessment & Plan Note (Signed)
Started on fidaxomicin x 10 days with enteric precautions

## 2022-10-30 NOTE — Discharge Instructions (Addendum)
Dear Arna Medici,   Thank you so much for allowing [place] to be part of your care!  You were admitted to [facility] for abdominal pain, diarrhea, vomiting. You were found to have C Diff and were treated with fidaxomicin to complete on 11/08/22.     POST-HOSPITAL & CARE INSTRUCTIONS Follow up with GI doctors  Please let PCP/Specialists know of any changes that were made and follow up with your PCP ASAP for hospital follow up. Please see medications section of this packet for any medication changes.   DOCTOR'S APPOINTMENT & FOLLOW UP CARE INSTRUCTIONS  Future Appointments  Date Time Provider Department Center  01/04/2023  1:30 PM Imogene Burn, MD [facility]    RETURN PRECAUTIONS: -Worsening diarrhea and vomiting  -Chest pain, shortness of breath -Loss of consciousness   Take care and be well!  Family Medicine Teaching Service  [place]  [facility]  [address] [place], [place] [address] [phone]

## 2022-10-30 NOTE — Procedures (Signed)
Patient seen and examined on Hemodialysis. The procedure was supervised and I have made appropriate changes. BP 135/73   Pulse 63   Temp (!) 97.5 F (36.4 C)   Resp 16   Ht 5\' 4"  (1.626 m)   Wt 53 kg   SpO2 100%   BMI 20.06 kg/m   QB 400 mL/ min via AVF, UF goal even  Tolerating treatment without complaints at this time.   Bufford Buttner MD [facility] [phone] 9:17 AM

## 2022-10-30 NOTE — Progress Notes (Signed)
Mobility Specialist: Progress Note   10/30/22 1516  Mobility  Activity Ambulated independently in hallway  Level of Assistance Independent  Assistive Device None  Distance Ambulated (ft) 470 ft  Activity Response Tolerated well  Mobility Referral Yes  $Mobility charge 1 Mobility  Mobility Specialist Start Time (ACUTE ONLY) 1500  Mobility Specialist Stop Time (ACUTE ONLY) 1515  Mobility Specialist Time Calculation (min) (ACUTE ONLY) 15 min   Received pt sitting EOB having no complaints and agreeable to mobility. Pt was asymptomatic throughout ambulation and returned to room w/o fault. Left up at sink w/ all needs met.  Roxene Alviar Mobility Specialist Please contact via SecureChat or Rehab office at [phone]

## 2022-10-30 NOTE — Progress Notes (Signed)
[facility] Progress Note   Subjective: Seen on dialysis.  No complaints.  Abd is better.    Objective Vitals:   10/29/22 2355 10/30/22 0448 10/30/22 0845 10/30/22 0855  BP: 109/62 97/63 128/75 135/73  Pulse: 67 72 73 63  Resp: 19 16 15 16   Temp: 97.9 F (36.6 C) 97.8 F (36.6 C) (!) 97.5 F (36.4 C)   TempSrc: Oral Oral    SpO2: 99% 99%  100%  Weight:  52.9 kg 53 kg   Height:          Additional Objective Labs: Basic Metabolic Panel: Recent Labs  Lab 10/28/22 0132 10/29/22 0143 10/30/22 0138  NA 136 132* 130*  K 4.0 3.3* 4.0  CL 95* 94* 88*  CO2 25 25 26   GLUCOSE 87 76 88  BUN 22 11 22   CREATININE 10.54* 6.14* 8.74*  CALCIUM 9.3 8.9 9.2  PHOS 5.7* 3.5 4.0   CBC: Recent Labs  Lab 10/27/22 1445 10/27/22 1523 10/28/22 0132 10/28/22 1917 10/29/22 0142 10/29/22 1253 10/30/22 0138  WBC 9.4  --  8.9 7.5 7.2 5.8 5.1  NEUTROABS 6.2  --   --   --   --   --   --   HGB 14.5   < > 12.5 11.9* 10.9* 11.9* 10.2*  HCT 47.6*   < > 41.8 39.3 35.8* 39.5 32.8*  MCV 101.7*  --  99.1 100.3* 97.3 98.8 97.0  PLT 233  --  210 174 161 172 149*   < > = values in this interval not displayed.   Blood Culture    Component Value Date/Time   SDES TISSUE WRIST LEFT SYNOVIAL 05/27/2021 1906   SDES WRIST LEFT SYNOVIAL 05/27/2021 1906   SPECREQUEST IN A CUP 05/27/2021 1906   SPECREQUEST SWABS 05/27/2021 1906   CULT  05/27/2021 1906    RARE PROPIONIBACTERIUM ACNES Standardized susceptibility testing for this organism is not available. Performed at [facility], [address] [place], [place] [address]    CULT  05/27/2021 1906    No growth aerobically or anaerobically. Performed at [facility], [address], [place], [place] [address]    REPTSTATUS 06/01/2021 FINAL 05/27/2021 1906   REPTSTATUS 06/01/2021 FINAL 05/27/2021 1906     Physical Exam General: Sitting up in bed, nad  Heart: RRR  Lungs: Clear bilaterally  Abdomen: soft  non-tender Extremities: no LE edema  Dialysis Access: L UE AVF +bruit   Medications:   Chlorhexidine Gluconate Cloth  6 each Topical Q0600   fidaxomicin  200 mg Oral BID   pantoprazole (PROTONIX) IV  40 mg Intravenous BID   potassium chloride  40 mEq Oral Once   sevelamer carbonate  800 mg Oral TID with meals   sodium chloride flush  3 mL Intravenous Q12H    Dialysis Orders:  HP TTS 3:15 350/A1.5x  EDW 50.7 kg 2K/2Ca AVG Heparin 3600  -Mircera 75 q 2 wks - last 6/13  -Hectorol 3 q HD, Sensipar 60 q HD      Assessment/Plan: GIB/Hematochezia - h/o gastric/duodenal ulcers. GI consulted. Hgb12.5>11.9>10.9  C diff + - on fidaxomicin  Hypotension - likely secondary to bleeding/GI losses. Received 2 L IVF in the ED 6/20. Follow. H/o HTN - off BP meds for now  ESRD -  HD TTS. Continue on schedule. HD 6/22 today.  No UF today  Anemia  -On ESA as outpatient. Follow trends.  Metabolic bone disease -  Continue home binders/Sensipar when  eating.  Nutrition - On CL diet   Bufford Buttner MD [employer] 10/30/2022,9:13 AM

## 2022-10-30 NOTE — Progress Notes (Signed)
     Daily Progress Note Intern Pager: [phone]  Patient name: Kathleen [name] Medical record number: [id number] Date of birth: Dec 22, 1960 Age: 62 y.o. Gender: female  Primary Care Provider: Hildred Priest, NP Consultants: Jeralyn Ruths, GI Code Status: FULL   Pt Overview and Major Events to Date:  Admitted FPTS 10/27/22  Assessment and Plan:  Kathleen [name] is a 62 y.o. admitted for suspected acute GI bleed in the setting SLE, ESRD on HD, and prior peptic duodenitis. Found to be C-diff positive.    (Principal) C. difficile colitis   Started on fidaxomicin x 10 days with enteric precautions. Improving, will continue.       ESRD (end stage renal disease) (HCC)  - Nephro recs  - Scheduled HD TThS - Phos binder      Acute GI bleeding   Treating Cdiff with fidaxomicin, follow up with GI outpatient  - PPI BID -- Consult CCM for MAP <60  Poor insight into disease, will keep in mind when discussing treatment plans.   Chronic and Stable  Lupus -- Hydroxychloroquine discontinued at prior admission HTN -- Hold hydral, amlodipine, coreg for now in setting of hypotension ESRD on HD -- HD today  COPD -- monitor   FEN/GI: Heart Healthy  PPx: SCD  Dispo: Anticipate today after dialysis  Subjective:  Doing well and notes good improvement of symptoms, no abdominal pain, has not had a bm today.   Objective: Temp:  [97.4 F (36.3 C)-98.3 F (36.8 C)] 97.8 F (36.6 C) (06/22 0448) Pulse Rate:  [67-85] 72 (06/22 0448) Resp:  [13-20] 16 (06/22 0448) BP: (92-136)/(62-78) 97/63 (06/22 0448) SpO2:  [99 %-100 %] 99 % (06/22 0448) Weight:  [52.9 kg] 52.9 kg (06/22 0448) General: Alert in no apparent distress Heart: Regular rate and rhythm with no murmurs appreciated Lungs: normal wob Abdomen: nondistended, soft, no abdominal pain Skin: Warm and dry   Laboratory: Most recent CBC Lab Results  Component Value Date   WBC 5.8 10/29/2022   HGB 11.9 (L) 10/29/2022   HCT 39.5  10/29/2022   MCV 98.8 10/29/2022   PLT 172 10/29/2022   Most recent BMP    Latest Ref Rng & Units 10/30/2022    1:38 AM  BMP  Glucose 70 - 99 mg/dL 88   BUN 8 - 23 mg/dL 22   Creatinine 1.47 - 1.00 mg/dL 8.29   Sodium 562 - 130 mmol/L 130   Potassium 3.5 - 5.1 mmol/L 4.0   Chloride 98 - 111 mmol/L 88   CO2 22 - 32 mmol/L 26   Calcium 8.9 - 10.3 mg/dL 9.2     Alfredo Martinez, MD 10/30/2022, 5:40 AM  PGY-2, [facility] FPTS Intern pager: [phone], text pages welcome Secure chat group [facility] Teaching Service

## 2022-10-30 NOTE — Progress Notes (Signed)
   10/30/22 1205  Vitals  Temp 97.7 F (36.5 C)  Pulse Rate 71  Resp 17  BP 139/74  SpO2 90 %  Post Treatment  Dialyzer Clearance Lightly streaked  Duration of HD Treatment -hour(s) 3 hour(s)  Hemodialysis Intake (mL) 0 mL  Liters Processed 54  Fluid Removed (mL) 0 mL  Tolerated HD Treatment Yes  AVG/AVF Arterial Site Held (minutes) 10 minutes  AVG/AVF Venous Site Held (minutes) 10 minutes   Received patient in bed to unit.  Alert and oriented.  Informed consent signed and in chart.   TX duration:3hrs  Patient tolerated well.  Transported back to the room  Alert, without acute distress.  Hand-off given to patient's nurse.   Access used: LAVG Access issues: none  Total UF removed: 0 Medication(s) given: none   [facility]

## 2022-10-30 NOTE — Assessment & Plan Note (Signed)
Treating Cdiff with fidaxomicin, follow up with GI outpatient  - PPI BID -- Consult CCM for MAP <60

## 2022-10-30 NOTE — Discharge Summary (Signed)
Family Medicine Teaching [facility] Discharge Summary  Patient name: Kathleen [name] Medical record number: [id number] Date of birth: 1960-09-02 Age: 62 y.o. Gender: female Date of Admission: 10/27/2022  Date of Discharge: 10/30/2022 Admitting Physician: Westley Chandler, MD  Primary Care Provider: Hildred Priest, NP Consultants: Nephrology, Gastroenterology   Indication for Hospitalization: Concern for GI bleed  Brief Hospital Course:  Carola Viramontes is a 62 y.o.female with a history of HTN, rectal bleeding, ESRD on HD, lupus, COPD, anemia of ESRD, TIA, diastolic heart failure who was admitted to the [facility] Teaching Service at [facility] for rectal bleeding. Her hospital course is detailed below:  Rectal Bleeding Patient p/f rectal bleeding with normal Hgb but soft pressures. CTA abdomen unremarkable, CT chest also unremarkable for source of bleed. GI was consulted and recommended GI stool studies. Required no pRBCs, maintained Hgb throughout stay with discharge hgb of 11.9. Was given midodrine for soft pressures and improved with dialysis and gentle fluid resuscitation as needed. Continued with PPI as well during admission, patient remained stable and was able to tolerate PO. She was Cdiff antigen positive, toxin negative, and PCR positive while symptomatic. Decided to treat with Fidaxomicin BID x10 days. Had improvement of symptoms with treatment. Hgb stable at 10.2 on discharge.  ESRD on HD  Continued regular HD sessions inpatient.  Other chronic conditions were medically managed with home medications and formulary alternatives as necessary (ESRD on HD, HTN, lupus, COPD)  PCP Follow-up Recommendations: Ensure completion of fidaxomicin (last dose 11/08/2022) Follow up with GI Continue protonix BID  CBC on PCP follow up  Continue regular nephro HD sessions outpatient   Discharge Diagnoses/Problem List:  Present on Admission:  Acute GI bleeding  ESRD (end stage renal disease)  (HCC)  C. difficile colitis   Disposition: Home   Discharge Condition: Stable   Discharge Exam performed by Dr. Jena Gauss:  Blood pressure 139/74, pulse 71, temperature 97.7 F (36.5 C), resp. rate 17, height 5\' 4"  (1.626 m), weight 53 kg, SpO2 90 %. General: Alert in no apparent distress Heart: Regular rate and rhythm with no murmurs appreciated Lungs: normal wob Abdomen: nondistended, soft, no abdominal pain Skin: Warm and dry  Significant Procedures: None   Significant Labs and Imaging:  Recent Labs  Lab 10/29/22 0142 10/29/22 1253 10/30/22 0138  WBC 7.2 5.8 5.1  HGB 10.9* 11.9* 10.2*  HCT 35.8* 39.5 32.8*  PLT 161 172 149*   Recent Labs  Lab 10/29/22 0143 10/30/22 0138  NA 132* 130*  K 3.3* 4.0  CL 94* 88*  CO2 25 26  GLUCOSE 76 88  BUN 11 22  CREATININE 6.14* 8.74*  CALCIUM 8.9 9.2  MG 1.8  --   PHOS 3.5 4.0  ALBUMIN 3.0* 3.1*    CT Angio Abd/Pel W and/or Wo Contrast  Result Date: 10/27/2022 IMPRESSION: VASCULAR Atherosclerotic calcifications are noted. Focal narrowing in the proximal right external iliac artery is noted. No aneurysmal dilatation or dissection is seen. The renal arteries are diminutive bilaterally with associated renal atrophy. No other vascular abnormality is seen. NON-VASCULAR Cholelithiasis without complicating factors. No findings to suggest active GI hemorrhage.  DG Chest Portable 1 View  Result Date: 10/27/2022 IMPRESSION: There are no signs of pulmonary edema or focal pulmonary consolidation.    Results/Tests Pending at Time of Discharge: None   Discharge Medications:  Allergies as of 10/30/2022   No Known Allergies      Medication List     STOP taking these  medications    carvedilol 3.125 MG tablet Commonly known as: COREG   nicotine 14 mg/24hr patch Commonly known as: NICODERM CQ - dosed in mg/24 hours       TAKE these medications    acetaminophen 325 MG tablet Commonly known as: TYLENOL Take 650 mg by mouth  every 6 (six) hours as needed for pain.   Dificid 200 MG Tabs tablet Generic drug: fidaxomicin Take 1 tablet (200 mg total) by mouth 2 (two) times daily for 10 days.   pantoprazole 40 MG tablet Commonly known as: Protonix Take 1 tablet (40 mg total) by mouth 2 (two) times daily. What changed:  when to take this Another medication with the same name was removed. Continue taking this medication, and follow the directions you see here.   sevelamer carbonate 800 MG tablet Commonly known as: RENVELA Take 800-2,400 mg by mouth See admin instructions. 2400 mg with meals 800 mg with snacks        Discharge Instructions: Please refer to Patient Instructions section of EMR for full details.  Patient was counseled important signs and symptoms that should prompt return to medical care, changes in medications, dietary instructions, activity restrictions, and follow up appointments.   Follow-Up Appointments:  Follow-up Information     Hildred Priest, NP. Schedule an appointment as soon as possible for a visit in 1 week(s).   Specialty: Internal Medicine Contact information: [address] [place] [address] [phone]         Arnaldo Natal, NP. Schedule an appointment as soon as possible for a visit.   Specialty: Gastroenterology Contact information: [address] [place] [address] [phone]                Evette Georges, MD 10/30/2022, 1:09 PM PGY-1, [facility]

## 2022-10-30 NOTE — Plan of Care (Signed)
[facility] Patient Discharge Orders- [facility] CLINIC: [place]  Patient's name: Salwa Bai Admit/DC Dates: 10/27/2022 - 10/30/22  Discharge Diagnoses: GIB/hematochezia   C diff colitis on fidaxomicin   Aranesp: Given: none   Last Hgb: 10.2 PRBC's Given: none  ESA dose for discharge: mircera 75 mcg IV q 2 weeks  IV Iron dose at discharge: n/a  Heparin change: no change  EDW Change: no change New EDW:   [place] Change: no change  Access intervention/Change: no change Details:  Hectorol/Calcitriol change: no change  Discharge Labs: Calcium 9.2 Phosphorus 4.0 Albumin 3.1 K+ 4.0  IV Antibiotics: none Details:  On Coumadin?: none Last INR: Next INR: Managed By:   OTHER/APPTS/LAB ORDERS:    D/C Meds to be reconciled by nurse after every discharge.  Completed By:   Reviewed by: MD:______ RN_______

## 2022-10-31 ENCOUNTER — Telehealth: Payer: Self-pay | Admitting: Nephrology

## 2022-10-31 NOTE — Telephone Encounter (Signed)
Transition of Care Contact from Inpatient Facility   Date of Discharge: 10/30/22 Date of Contact: 11/01/22 Method of contact: phone Talked to patient   Patient contacted to discuss transition of care form recent hospitaliztion. Patient was admitted to [facility] from 10/27/22 to 10/30/22 with the discharge diagnosis of C diff colitis, Acute GIB.     Medication changes were reviewed.  Patient will follow up with is outpatient dialysis center 11/02/22.  Other follow up needs include none identified.   Virgina Norfolk, PA-C [employer]

## 2022-11-01 NOTE — Progress Notes (Signed)
Late Note Entry  Pt was d/c to home on Saturday. Contacted [facility] [place] [facility] this morning to advise clinic of pt's d/c date and that pt should resume care tomorrow.   Olivia Canter Renal Navigator [phone]

## 2022-12-02 ENCOUNTER — Inpatient Hospital Stay (HOSPITAL_COMMUNITY)
Admission: EM | Admit: 2022-12-02 | Discharge: 2022-12-06 | DRG: 278 | Disposition: A | Discharge status: Home / Self Care | Payer: Medicare HMO | Attending: Obstetrics and Gynecology | Admitting: Obstetrics and Gynecology

## 2022-12-02 ENCOUNTER — Encounter (HOSPITAL_COMMUNITY): Payer: Self-pay | Admitting: Emergency Medicine

## 2022-12-02 ENCOUNTER — Other Ambulatory Visit: Payer: Self-pay

## 2022-12-02 DIAGNOSIS — F17210 Nicotine dependence, cigarettes, uncomplicated: Secondary | ICD-10-CM | POA: Diagnosis present

## 2022-12-02 DIAGNOSIS — E875 Hyperkalemia: Secondary | ICD-10-CM | POA: Diagnosis not present

## 2022-12-02 DIAGNOSIS — I120 Hypertensive chronic kidney disease with stage 5 chronic kidney disease or end stage renal disease: Secondary | ICD-10-CM | POA: Diagnosis present

## 2022-12-02 DIAGNOSIS — T829XXA Unspecified complication of cardiac and vascular prosthetic device, implant and graft, initial encounter: Secondary | ICD-10-CM | POA: Diagnosis present

## 2022-12-02 DIAGNOSIS — N189 Chronic kidney disease, unspecified: Secondary | ICD-10-CM | POA: Diagnosis not present

## 2022-12-02 DIAGNOSIS — D631 Anemia in chronic kidney disease: Secondary | ICD-10-CM | POA: Diagnosis present

## 2022-12-02 DIAGNOSIS — Z79899 Other long term (current) drug therapy: Secondary | ICD-10-CM

## 2022-12-02 DIAGNOSIS — Z862 Personal history of diseases of the blood and blood-forming organs and certain disorders involving the immune mechanism: Secondary | ICD-10-CM

## 2022-12-02 DIAGNOSIS — Z992 Dependence on renal dialysis: Secondary | ICD-10-CM

## 2022-12-02 DIAGNOSIS — N186 End stage renal disease: Secondary | ICD-10-CM

## 2022-12-02 DIAGNOSIS — Z91158 Patient's noncompliance with renal dialysis for other reason: Secondary | ICD-10-CM

## 2022-12-02 DIAGNOSIS — J449 Chronic obstructive pulmonary disease, unspecified: Secondary | ICD-10-CM | POA: Diagnosis present

## 2022-12-02 DIAGNOSIS — T82818A Embolism of vascular prosthetic devices, implants and grafts, initial encounter: Principal | ICD-10-CM | POA: Diagnosis present

## 2022-12-02 DIAGNOSIS — I871 Compression of vein: Secondary | ICD-10-CM | POA: Diagnosis present

## 2022-12-02 DIAGNOSIS — M898X9 Other specified disorders of bone, unspecified site: Secondary | ICD-10-CM | POA: Diagnosis present

## 2022-12-02 DIAGNOSIS — Y832 Surgical operation with anastomosis, bypass or graft as the cause of abnormal reaction of the patient, or of later complication, without mention of misadventure at the time of the procedure: Secondary | ICD-10-CM | POA: Diagnosis present

## 2022-12-02 DIAGNOSIS — Y712 Prosthetic and other implants, materials and accessory cardiovascular devices associated with adverse incidents: Secondary | ICD-10-CM | POA: Diagnosis present

## 2022-12-02 DIAGNOSIS — D62 Acute posthemorrhagic anemia: Secondary | ICD-10-CM | POA: Diagnosis not present

## 2022-12-02 DIAGNOSIS — Z8673 Personal history of transient ischemic attack (TIA), and cerebral infarction without residual deficits: Secondary | ICD-10-CM

## 2022-12-02 DIAGNOSIS — Z86718 Personal history of other venous thrombosis and embolism: Secondary | ICD-10-CM

## 2022-12-02 LAB — CBC WITH DIFFERENTIAL/PLATELET
Abs Immature Granulocytes: 0.02 10*3/uL (ref 0.00–0.07)
Basophils Absolute: 0.1 10*3/uL (ref 0.0–0.1)
Basophils Relative: 1 %
Eosinophils Absolute: 0.1 10*3/uL (ref 0.0–0.5)
Eosinophils Relative: 1 %
HCT: 36.6 % (ref 36.0–46.0)
Hemoglobin: 11.3 g/dL — ABNORMAL LOW (ref 12.0–15.0)
Immature Granulocytes: 0 %
Lymphocytes Relative: 22 %
Lymphs Abs: 1.6 10*3/uL (ref 0.7–4.0)
MCH: 28.9 pg (ref 26.0–34.0)
MCHC: 30.9 g/dL (ref 30.0–36.0)
MCV: 93.6 fL (ref 80.0–100.0)
Monocytes Absolute: 0.9 10*3/uL (ref 0.1–1.0)
Monocytes Relative: 13 %
Neutro Abs: 4.4 10*3/uL (ref 1.7–7.7)
Neutrophils Relative %: 63 %
Platelets: 200 10*3/uL (ref 150–400)
RBC: 3.91 MIL/uL (ref 3.87–5.11)
RDW: 16.7 % — ABNORMAL HIGH (ref 11.5–15.5)
WBC: 7 10*3/uL (ref 4.0–10.5)
nRBC: 0 % (ref 0.0–0.2)

## 2022-12-02 LAB — I-STAT CHEM 8, ED
BUN: 66 mg/dL — ABNORMAL HIGH (ref 8–23)
Calcium, Ion: 1.05 mmol/L — ABNORMAL LOW (ref 1.15–1.40)
Chloride: 105 mmol/L (ref 98–111)
Creatinine, Ser: >18 mg/dL — ABNORMAL HIGH (ref 0.44–1.00)
Glucose, Bld: 73 mg/dL (ref 70–99)
HCT: 36 % (ref 36.0–46.0)
Hemoglobin: 12.2 g/dL (ref 12.0–15.0)
Potassium: 5.7 mmol/L — ABNORMAL HIGH (ref 3.5–5.1)
Sodium: 135 mmol/L (ref 135–145)
TCO2: 18 mmol/L — ABNORMAL LOW (ref 22–32)

## 2022-12-02 MED ORDER — ANTICOAGULANT SODIUM CITRATE 4% (200MG/5ML) IV SOLN: 5.0000 mL | Status: DC | PRN

## 2022-12-02 MED ORDER — ALTEPLASE 2 MG IJ SOLR: 2.0000 mg | Freq: Once | INTRAMUSCULAR | Status: DC | PRN

## 2022-12-02 MED ORDER — HEPARIN SODIUM (PORCINE) 1000 UNIT/ML DIALYSIS: 20.0000 [IU]/kg | INTRAMUSCULAR | Status: DC | PRN

## 2022-12-02 MED ORDER — LIDOCAINE HCL (PF) 1 % IJ SOLN: 5.0000 mL | INTRAMUSCULAR | Status: DC | PRN

## 2022-12-02 MED ORDER — ACETAMINOPHEN 650 MG RE SUPP: 650.0000 mg | Freq: Four times a day (QID) | RECTAL | Status: DC | PRN

## 2022-12-02 MED ORDER — SODIUM ZIRCONIUM CYCLOSILICATE 10 G PO PACK
10.0000 g | PACK | Freq: Once | ORAL | Status: AC
  Administered 2022-12-02: 10 g via ORAL
  Filled 2022-12-02: qty 1

## 2022-12-02 MED ORDER — CHLORHEXIDINE GLUCONATE CLOTH 2 % EX PADS: 6.0000 | MEDICATED_PAD | Freq: Every day | CUTANEOUS | Status: DC

## 2022-12-02 MED ORDER — ACETAMINOPHEN 325 MG PO TABS: 650.0000 mg | ORAL_TABLET | Freq: Four times a day (QID) | ORAL | Status: DC | PRN

## 2022-12-02 MED ORDER — OXYCODONE-ACETAMINOPHEN 5-325 MG PO TABS
1.0000 | ORAL_TABLET | Freq: Once | ORAL | Status: AC
  Administered 2022-12-02: 1 via ORAL
  Filled 2022-12-02: qty 1

## 2022-12-02 MED ORDER — PENTAFLUOROPROP-TETRAFLUOROETH EX AERO: 1.0000 | INHALATION_SPRAY | CUTANEOUS | Status: DC | PRN

## 2022-12-02 MED ORDER — LIDOCAINE-PRILOCAINE 2.5-2.5 % EX CREA: 1.0000 | TOPICAL_CREAM | CUTANEOUS | Status: DC | PRN

## 2022-12-02 MED ORDER — HEPARIN SODIUM (PORCINE) 1000 UNIT/ML DIALYSIS
1000.0000 [IU] | INTRAMUSCULAR | Status: DC | PRN
  Filled 2022-12-02: qty 1

## 2022-12-02 NOTE — ED Triage Notes (Signed)
Pt fistula in left upper arm clogged and was unable to be used on Tuesday. Pt went to vein and vascular today and was told it should not be so painful and to come here to have it evaluated. Pt states pain hurts up to armpit a.   Pt had new access placed in right upper chest and needs dialysis. Last dialysis was Saturday.

## 2022-12-02 NOTE — H&P (Signed)
History and Physical      Kathleen [name] VWU:[id number] DOB: 07/31/1960 DOA: 12/02/2022; DOS: 12/02/2022  PCP: Hildred Priest, NP  Patient coming from: home   I have personally briefly reviewed patient's old medical records in [facility]  Chief Complaint: Clot in left upper extremity AV fistula  HPI: Kathleen [name] is a 62 y.o. female with medical history significant for end-stage renal disease on hemodialysis on Tuesday, Thursday, Saturday schedule, anemia of chronic kidney disease associated baseline hemoglobin 10-12, who is admitted to [facility] on 12/02/2022 with hyperkalemia after presenting from home to [facility] ED complaining of clot in left upper extremity AV fistula.   The patient confirms that she has end-stage renal disease on hemodialysis, on Tuesday, Thursday, Saturday schedule.  She conveys that she attempted to attend her scheduled hemodialysis session scheduled for Tuesday, 11/30/2022, at which time the totality of the HD session was not able to be completed, due to pain associated with the left upper extremity AV fistula site, which led to discovery of associated clot within the fistula.  Consequently, she notes that her most recent the completed hemodialysis session occurred on Saturday, 11/27/2022.  She returned to the dialysis center on 12/02/2022, at which time a tunneled HD catheter was placed and the patient was instructed to present to the emergency department to assist with expediting hemodialysis.   The patient is without complaint at this time, she denies any recent shortness of breath, cough, orthopnea, PND, or any significant worsening of her peripheral edema.  Denies any recent chest pain, palpitations, diaphoresis, nausea, vomiting, dizziness, presyncope, or syncope.  No recent change in appetite or taste.    ED Course:  Vital signs in the ED were notable for the following: Afebrile; heart rates in the 80s; systolic blood pressure in the 170s;  respiratory rate 18-20, oxygen saturation 100% on room air.  Labs were notable for the following: I-STAT BMP showed sodium 137, potassium 5.7, bicarbonate 18 and glucose 73.  CBC notable for white blood cell count 7000, hemoglobin 11.3 associated normocytic/normochromic properties, and relative to most recent prior hemoglobin data point of 10.2 on 10/30/2022.  No EKG or imaging performed in the ED today.  EDP discussed patient's case with the on-call nephrologist, Dr. Kathrene Bongo, who conveyed that she will formally consult and plans for hemodialysis first thing in the morning (7/26), recommending interval dose of Lokelma for the patient's hyperkalemia.  Additionally, Dr. Kathrene Bongo conveyed that she will assist with pursuing declotting of the patient's left upper extremity AV fistula.  While in the ED, the following were administered: Lokelma 10 g p.o. x 1 dose, Percocet 5/325 mg p.o. x 1 dose.  Subsequently, the patient was admitted for overnight observation for further evaluation management of presenting hyperkalemia in the setting of complication involving left upper extremity AV fistula.    Review of Systems: As per HPI otherwise 10 point review of systems negative.   Past Medical History:  Diagnosis Date   Anemia    Arthritis    Cardiac arrest (HCC)    Cellulitis    Cerebral vasculitis    COPD (chronic obstructive pulmonary disease) (HCC)    Elevated antinuclear antibody (ANA) level    Endometriosis    ESRD (end stage renal disease) on dialysis (HCC)    Gallstones    Hypertension    Lupus (HCC)    Migraine    PONV (postoperative nausea and vomiting)    Seizures (HCC)    Sepsis (HCC)  Sleep apnea    Suicide attempt (HCC)    TB (tuberculosis)    Transient ischemic attack (TIA)    Vasculitis of skin     Past Surgical History:  Procedure Laterality Date   AV FISTULA PLACEMENT Left 07/04/2017   Procedure: ARTERIOVENOUS (AV) FISTULA CREATION;  Surgeon: Fransisco Hertz,  MD;  Location: [facility] OR;  Service: Vascular;  Laterality: Left;   AV FISTULA PLACEMENT Left 11/27/2019   Procedure: INSERTION OF ARTERIOVENOUS (AV) GORE-TEX GRAFT LEFT UPPER ARM;  Surgeon: Chuck Hint, MD;  Location: [facility] OR;  Service: Vascular;  Laterality: Left;   AV FISTULA PLACEMENT Left 07/29/2020   Procedure: INSERTION OF LEFT UPPER ARM ARTERIOVENOUS (AV) GORE-TEX GRAFT;  Surgeon: Chuck Hint, MD;  Location: [facility] OR;  Service: Vascular;  Laterality: Left;   BIOPSY  12/23/2021   Procedure: BIOPSY;  Surgeon: Tressia Danas, MD;  Location: [facility] ENDOSCOPY;  Service: Gastroenterology;;   BUBBLE STUDY  05/25/2021   Procedure: BUBBLE STUDY;  Surgeon: Sande Rives, MD;  Location: [facility] ENDOSCOPY;  Service: Cardiovascular;;   CESAREAN SECTION     COLONOSCOPY WITH PROPOFOL N/A 12/23/2021   Procedure: COLONOSCOPY WITH PROPOFOL;  Surgeon: Tressia Danas, MD;  Location: [facility] ENDOSCOPY;  Service: Gastroenterology;  Laterality: N/A;   ESOPHAGOGASTRODUODENOSCOPY N/A 07/15/2016   Procedure: ESOPHAGOGASTRODUODENOSCOPY (EGD);  Surgeon: Carman Ching, MD;  Location: [facility] ENDOSCOPY;  Service: Endoscopy;  Laterality: N/A;   ESOPHAGOGASTRODUODENOSCOPY N/A 12/23/2021   Procedure: ESOPHAGOGASTRODUODENOSCOPY (EGD);  Surgeon: Tressia Danas, MD;  Location: [facility] ENDOSCOPY;  Service: Gastroenterology;  Laterality: N/A;   I & D EXTREMITY Left 05/27/2021   Procedure: IRRIGATION AND DEBRIDEMENT OF WRIST;  Surgeon: Bradly Bienenstock, MD;  Location: [facility] OR;  Service: Orthopedics;  Laterality: Left;   INSERTION OF DIALYSIS CATHETER Right 07/04/2017   Procedure: INSERTION OF TUNNELED  DIALYSIS CATHETER - RIGHT INTERNAL JUGULAR PLACEMENT;  Surgeon: Fransisco Hertz, MD;  Location: [facility] OR;  Service: Vascular;  Laterality: Right;   IR FLUORO GUIDE CV LINE RIGHT  09/08/2021   IR THROMBECTOMY AV FISTULA W/THROMBOLYSIS/PTA INC/SHUNT/IMG LEFT Left 09/10/2021   IR US GUIDE VASC ACCESS LEFT  09/10/2021   IR US GUIDE VASC ACCESS RIGHT   09/08/2021   LUNG BIOPSY     POLYPECTOMY  12/23/2021   Procedure: POLYPECTOMY;  Surgeon: Tressia Danas, MD;  Location: [facility] ENDOSCOPY;  Service: Gastroenterology;;   TEE WITHOUT CARDIOVERSION N/A 05/25/2021   Procedure: TRANSESOPHAGEAL ECHOCARDIOGRAM (TEE);  Surgeon: Sande Rives, MD;  Location: [facility] ENDOSCOPY;  Service: Cardiovascular;  Laterality: N/A;   TUBAL LIGATION      Social History:  reports that she has been smoking cigarettes. She has never used smokeless tobacco. She reports that she does not drink alcohol and does not use drugs.   No Known Allergies  Family History  Problem Relation Age of Onset   Ovarian cancer Mother    Breast cancer Sister    Throat cancer Brother     Family history reviewed and not pertinent    Prior to Admission medications   Medication Sig Start Date End Date Taking? Authorizing Provider  acetaminophen (TYLENOL) 325 MG tablet Take 650 mg by mouth every 6 (six) hours as needed for pain.    [provider]  pantoprazole (PROTONIX) 40 MG tablet Take 1 tablet (40 mg total) by mouth 2 (two) times daily. 10/30/22 11/29/22  Evette Georges, MD  sevelamer carbonate (RENVELA) 800 MG tablet Take 800-2,400 mg by mouth See admin instructions. 2400 mg with meals 800  mg with snacks 08/28/21   [provider]     Objective    Physical Exam: Vitals:   12/02/22 1438 12/02/22 1449 12/02/22 1949  BP: (!) 175/108  (!) 188/108  Pulse: 89  89  Resp: 20  18  Temp: 98.6 F (37 C)  99.9 F (37.7 C)  TempSrc: Oral  Oral  SpO2: 100%  100%  Weight:  53 kg     General: appears to be stated age; alert, oriented Skin: warm, dry, no rash Head:  AT/[place] Mouth:  Oral mucosa membranes appear moist, normal dentition Neck: supple; trachea midline Heart:  RRR; did not appreciate any M/R/G Lungs: CTAB, did not appreciate any wheezes, rales, or rhonchi Abdomen: + BS; soft, ND, NT Vascular: 2+ pedal pulses b/l; 2+ radial pulses b/l Extremities: no  peripheral edema, no muscle wasting Neuro: strength and sensation intact in upper and lower extremities b/l     Labs on Admission: I have personally reviewed following labs and imaging studies  CBC: Recent Labs  Lab 12/02/22 2109 12/02/22 2128  WBC 7.0  --   NEUTROABS 4.4  --   HGB 11.3* 12.2  HCT 36.6 36.0  MCV 93.6  --   PLT 200  --    Basic Metabolic Panel: Recent Labs  Lab 12/02/22 2128  NA 135  K 5.7*  CL 105  GLUCOSE 73  BUN 66*  CREATININE >18.00*   GFR: CrCl cannot be calculated (This lab value cannot be used to calculate CrCl because it is not a number: >18.00). Liver Function Tests: No results for input(s): "AST", "ALT", "ALKPHOS", "BILITOT", "PROT", "ALBUMIN" in the last 168 hours. No results for input(s): "LIPASE", "AMYLASE" in the last 168 hours. No results for input(s): "AMMONIA" in the last 168 hours. Coagulation Profile: No results for input(s): "INR", "PROTIME" in the last 168 hours. Cardiac Enzymes: No results for input(s): "CKTOTAL", "CKMB", "CKMBINDEX", "TROPONINI" in the last 168 hours. BNP (last 3 results) No results for input(s): "PROBNP" in the last 8760 hours. HbA1C: No results for input(s): "HGBA1C" in the last 72 hours. CBG: No results for input(s): "GLUCAP" in the last 168 hours. Lipid Profile: No results for input(s): "CHOL", "HDL", "LDLCALC", "TRIG", "CHOLHDL", "LDLDIRECT" in the last 72 hours. Thyroid Function Tests: No results for input(s): "TSH", "T4TOTAL", "FREET4", "T3FREE", "THYROIDAB" in the last 72 hours. Anemia Panel: No results for input(s): "VITAMINB12", "FOLATE", "FERRITIN", "TIBC", "IRON", "RETICCTPCT" in the last 72 hours. Urine analysis:    Component Value Date/Time   COLORURINE YELLOW 05/15/2021 2252   APPEARANCEUR CLEAR 05/15/2021 2252   LABSPEC 1.020 05/15/2021 2252   PHURINE 6.0 05/15/2021 2252   GLUCOSEU NEGATIVE 05/15/2021 2252   HGBUR NEGATIVE 05/15/2021 2252   BILIRUBINUR NEGATIVE 05/15/2021 2252    KETONESUR NEGATIVE 05/15/2021 2252   PROTEINUR NEGATIVE 05/15/2021 2252   NITRITE NEGATIVE 05/15/2021 2252   LEUKOCYTESUR NEGATIVE 05/15/2021 2252    Radiological Exams on Admission: No results found.    Assessment/Plan   Principal Problem:   Hyperkalemia Active Problems:   End stage renal disease on dialysis [facility])   Complication of arteriovenous dialysis fistula   History of anemia due to chronic kidney disease     #) Hyperkalemia: presenting serum potassium level, per iSTAT result, noted to be 5.7.  This is in the context of recently missed hemodialysis session as a consequence of clot in her left upper extremity AV fistula.   EDP discussed patient's case with the on-call nephrologist, Dr. Kathrene Bongo, who conveyed that she  will formally consult and plans for hemodialysis first thing in the morning (7/26), recommending interval dose of Lokelma for the patient's hyperkalemia. Patient is now status post receipt of dose of Lokelma 10 g p.o. x 1.   Will also check EKG to evaluate for associated changes.   Plan: monitor on telemetry. Add-on serum Mg level. Check phosphorus. Check full CMP and serum Mg in the AM.  Nephrology consulted, with plan for hemodialysis in the morning, as above.  Check EKG.                   #) Complication with left upper extremity AV fistula: Clot reported to be present in the left upper extremity AV fistula resulting in incomplete hemodialysis session on 11/30/2022, and missed hemodialysis session on 12/02/2022.  Subsequently, tunneled hemodialysis catheter has been placed.  EDP discussed patient's case with on-call nephrology, Dr. Kathrene Bongo, Who will formally consult, and will help facilitate declotting of left upper extremity AV fistula, noting plan for IR thrombectomy of the AV fistula.  Plan: Nephrology consulted, as above, with plan for IR thrombectomy of AV fistula.  N.p.o. in anticipation of this  procedure.                   #) ESRD: on HD (schedule:  T, Th, Sat). Recently missed hemodialysis sessions as a consequence of clot present in left upper extremity AV fistula, with interval placement of tunneled hemodialysis catheter and plan, per nephrology consultation for hemodialysis on the morning of 12/03/2022.  Mildly hyperkalemic as a consequence of recently missed HD sessions, as above.  No clinical evidence of acute volume overload, nor any evidence of uremia at this time.  Plan: monitor strict I's/O's, daily weights. CMP in the AM. Check mag and phos levels.  Nephrology consulted, with plan for hemodialysis in the morning (7/26).  Resume home [place].                    #) Anemia of chronic kidney disease: Documented history of such, a/w with baseline hgb range 10-12, with presenting hgb consistent with this range, in the absence of any overt evidence of active bleed.     Plan: Repeat CBC in the morning.     DVT prophylaxis: SCD's   Code Status: Full code Family Communication: none Disposition Plan: Per Rounding Team Consults called: EDP d/w on-call nephrology, Dr. Kathrene Bongo, who will formally consult, as further detailed above;  Admission status: obs     I SPENT GREATER THAN 75  MINUTES IN CLINICAL CARE TIME/MEDICAL DECISION-MAKING IN COMPLETING THIS ADMISSION.      Chaney Born Rhea Thrun DO [facility]  From 7PM - 7AM   12/02/2022, 11:31 PM

## 2022-12-02 NOTE — ED Provider Notes (Signed)
[place] EMERGENCY DEPARTMENT AT [facility] Provider Note   CSN: [id number] Arrival date & time: 12/02/22  1432     History  Chief Complaint  Patient presents with   Vascular Access Problem    Kathleen [name] is a 62 y.o. female.  Patient is a 62 year old female with a history of end-stage renal disease on dialysis Tuesday Thursday Saturday, lupus, hypertension, TIAs who is presenting today due to inability to get dialysis.  Patient's last normal dialysis was on Saturday.  She went on Tuesday but when they accessed it she was having terrible pain.  She did not get dialysis on Tuesday and they then noted she had a clot in her graft in her left upper arm.  They placed a tunneling catheter today and told her to come to the hospital for dialysis.  Patient denies any shortness of breath but is complaining of significant pain of where they recently put the catheter in as well as pain up to her axilla on the left arm where the clot was.  She has not noticed significant swelling in her lower extremities or upper extremities.  She denies any fever.  The history is provided by the patient.       Home Medications Prior to Admission medications   Medication Sig Start Date End Date Taking? Authorizing Provider  acetaminophen (TYLENOL) 325 MG tablet Take 650 mg by mouth every 6 (six) hours as needed for pain.    [provider]  pantoprazole (PROTONIX) 40 MG tablet Take 1 tablet (40 mg total) by mouth 2 (two) times daily. 10/30/22 11/29/22  Evette Georges, MD  sevelamer carbonate (RENVELA) 800 MG tablet Take 800-2,400 mg by mouth See admin instructions. 2400 mg with meals 800 mg with snacks 08/28/21   [provider]      Allergies    Patient has no known allergies.    Review of Systems   Review of Systems  Physical Exam Updated Vital Signs BP (!) 188/108 (BP Location: Right Arm)   Pulse 89   Temp 99.9 F (37.7 C) (Oral)   Resp 18   Wt 53 kg   SpO2 100%    BMI 20.06 kg/m  Physical Exam Vitals and nursing note reviewed.  Constitutional:      General: She is not in acute distress.    Appearance: She is well-developed.  HENT:     Head: Normocephalic and atraumatic.  Eyes:     Pupils: Pupils are equal, round, and reactive to light.  Cardiovascular:     Rate and Rhythm: Normal rate and regular rhythm.     Heart sounds: Normal heart sounds. No murmur heard.    No friction rub.  Pulmonary:     Effort: Pulmonary effort is normal.     Breath sounds: Normal breath sounds. No wheezing or rales.     Comments:  Catheter now present in the right upper chest with dressings applied and tenderness with palpation Chest:     Chest wall: Tenderness present.  Abdominal:     General: Bowel sounds are normal. There is no distension.     Palpations: Abdomen is soft.     Tenderness: There is no abdominal tenderness. There is no guarding or rebound.  Musculoskeletal:        General: Tenderness present. Normal range of motion.     Right lower leg: No edema.     Left lower leg: No edema.     Comments: No edema.  Fistula  noted in the left upper arm and tenderness with palpation but no significant arm swelling  Skin:    General: Skin is warm and dry.     Findings: No rash.  Neurological:     Mental Status: She is alert and oriented to person, place, and time. Mental status is at baseline.     Cranial Nerves: No cranial nerve deficit.  Psychiatric:        Behavior: Behavior normal.     ED Results / Procedures / Treatments   Labs (all labs ordered are listed, but only abnormal results are displayed) Labs Reviewed  CBC WITH DIFFERENTIAL/PLATELET - Abnormal; Notable for the following components:      Result Value   Hemoglobin 11.3 (*)    RDW 16.7 (*)    All other components within normal limits  I-STAT CHEM 8, ED - Abnormal; Notable for the following components:   Potassium 5.7 (*)    BUN 66 (*)    Creatinine, Ser >18.00 (*)    Calcium, Ion 1.05  (*)    TCO2 18 (*)    All other components within normal limits  HEPATITIS B SURFACE ANTIGEN  HEPATITIS B SURFACE ANTIBODY, QUANTITATIVE    EKG None  Radiology No results found.  Procedures Procedures    Medications Ordered in ED Medications  sodium zirconium cyclosilicate (LOKELMA) packet 10 g (has no administration in time range)  Chlorhexidine Gluconate Cloth 2 % PADS 6 each (has no administration in time range)  oxyCODONE-acetaminophen (PERCOCET/ROXICET) 5-325 MG per tablet 1 tablet (1 tablet Oral Given 12/02/22 2110)    ED Course/ Medical Decision Making/ A&P                             Medical Decision Making Amount and/or Complexity of Data Reviewed Labs: ordered. Decision-making details documented in ED Course.  Risk Prescription drug management. Decision regarding hospitalization.   Pt with multiple medical problems and comorbidities and presenting today with a complaint that caries a high risk for morbidity and mortality.  Here today due to needing dialysis.  Patient just had a tunneling catheter placed due to a clot in her fistula.  Last dialysis was on Saturday.  Patient is not having significant findings of fluid overload.  Labs are pending and will discuss with nephrology.  Patient is noted to be hypertensive today despite taking her medications.  10:43 PM I independently interpreted patient's labs and Chem-8 today with a potassium of 5.7 and elevated BUN and creatinine, CBC is stable.  Spoke with Dr. Kathrene Bongo who reports patient can be admitted for dialysis first thing in the morning to give the patient [facility] and they would have to figure out organizing getting her fistula declogged.  Patient and family are comfortable with this plan.  Will admit to unassigned medicine.         Final Clinical Impression(s) / ED Diagnoses Final diagnoses:  ESRD (end stage renal disease) (HCC)  Hyperkalemia    Rx / DC Orders ED Discharge Orders     None          Gwyneth Sprout, MD 12/02/22 2243

## 2022-12-02 NOTE — Progress Notes (Signed)
Was called about this patient.  She had full HD treatment on 7/20-  then was found to be clotted on 7/23-  went to [facility] today for declot-  was having too much pain in the access arm to be declotted so had TDC placed-  she presents here for HD and for evaluation for declot per another service    Dialysis Orders:  [facility] TTS 3:15 350/A1.5x  EDW 50.3 kg 2K/2Ca AVG Heparin 3600  -Mircera 100 q 2 wks -  -Hectorol 3 q HD, Sensipar 60 q HD   I have told the ER to get her admitted-  will put in orders for HD when nurses are able and will make npo after midnight for possible declot per IR tomorrow   Cecille Aver

## 2022-12-03 ENCOUNTER — Encounter (HOSPITAL_COMMUNITY): Payer: Self-pay | Admitting: Internal Medicine

## 2022-12-03 ENCOUNTER — Observation Stay (HOSPITAL_COMMUNITY): Payer: Medicare HMO

## 2022-12-03 DIAGNOSIS — Z862 Personal history of diseases of the blood and blood-forming organs and certain disorders involving the immune mechanism: Secondary | ICD-10-CM

## 2022-12-03 DIAGNOSIS — E875 Hyperkalemia: Secondary | ICD-10-CM | POA: Diagnosis not present

## 2022-12-03 DIAGNOSIS — N189 Chronic kidney disease, unspecified: Secondary | ICD-10-CM

## 2022-12-03 HISTORY — PX: IR THROMBECTOMY AV FISTULA W/THROMBOLYSIS INC/SHUNT/IMG LEFT: IMG6105

## 2022-12-03 HISTORY — PX: IR US GUIDE VASC ACCESS LEFT: IMG2389

## 2022-12-03 HISTORY — PX: IR THROMBECTOMY AV FISTULA W/THROMBOLYSIS/PTA INC/SHUNT/IMG LEFT: IMG6106

## 2022-12-03 LAB — BASIC METABOLIC PANEL
Anion gap: 19 — ABNORMAL HIGH (ref 5–15)
BUN: 30 mg/dL — ABNORMAL HIGH (ref 8–23)
CO2: 22 mmol/L (ref 22–32)
Calcium: 8.9 mg/dL (ref 8.9–10.3)
Chloride: 94 mmol/L — ABNORMAL LOW (ref 98–111)
Creatinine, Ser: 9.69 mg/dL — ABNORMAL HIGH (ref 0.44–1.00)
GFR, Estimated: 4 mL/min — ABNORMAL LOW (ref >60)
Glucose, Bld: 69 mg/dL — ABNORMAL LOW (ref 70–99)
Potassium: 4.3 mmol/L (ref 3.5–5.1)
Sodium: 135 mmol/L (ref 135–145)

## 2022-12-03 LAB — PROTIME-INR
INR: 1.2 (ref 0.8–1.2)
Prothrombin Time: 15.5 seconds — ABNORMAL HIGH (ref 11.4–15.2)

## 2022-12-03 LAB — TYPE AND SCREEN
ABO/RH(D): B POS
Antibody Screen: NEGATIVE

## 2022-12-03 LAB — HEMOGLOBIN AND HEMATOCRIT, BLOOD
HCT: 40.3 % (ref 36.0–46.0)
Hemoglobin: 12.6 g/dL (ref 12.0–15.0)

## 2022-12-03 MED ORDER — HEPARIN SODIUM (PORCINE) 1000 UNIT/ML IJ SOLN
INTRAMUSCULAR | Status: AC | PRN
  Administered 2022-12-03: 3000 [IU] via INTRAVENOUS

## 2022-12-03 MED ORDER — LIDOCAINE-EPINEPHRINE 1 %-1:100000 IJ SOLN: 20.0000 mL | Freq: Once | INTRAMUSCULAR | Status: DC

## 2022-12-03 MED ORDER — ALTEPLASE 2 MG IJ SOLR
INTRAMUSCULAR | Status: AC
  Filled 2022-12-03: qty 2

## 2022-12-03 MED ORDER — LIDOCAINE-PRILOCAINE 2.5-2.5 % EX CREA: 1.0000 | TOPICAL_CREAM | CUTANEOUS | Status: DC | PRN

## 2022-12-03 MED ORDER — CINACALCET HCL 30 MG PO TABS
60.0000 mg | ORAL_TABLET | ORAL | Status: DC
  Administered 2022-12-04: 60 mg via ORAL
  Filled 2022-12-03: qty 2

## 2022-12-03 MED ORDER — MIDAZOLAM HCL 2 MG/2ML IJ SOLN
INTRAMUSCULAR | Status: AC | PRN
  Administered 2022-12-03: .5 mg via INTRAVENOUS
  Administered 2022-12-03: 1 mg via INTRAVENOUS

## 2022-12-03 MED ORDER — NALOXONE HCL 0.4 MG/ML IJ SOLN: 0.4000 mg | INTRAMUSCULAR | Status: DC | PRN

## 2022-12-03 MED ORDER — MIDAZOLAM HCL 2 MG/2ML IJ SOLN
INTRAMUSCULAR | Status: AC
  Filled 2022-12-03: qty 2

## 2022-12-03 MED ORDER — CHLORHEXIDINE GLUCONATE CLOTH 2 % EX PADS
6.0000 | MEDICATED_PAD | Freq: Every day | CUTANEOUS | Status: DC
  Administered 2022-12-05 – 2022-12-06 (×2): 6 via TOPICAL

## 2022-12-03 MED ORDER — HEPARIN SODIUM (PORCINE) 1000 UNIT/ML DIALYSIS: 1000.0000 [IU] | INTRAMUSCULAR | Status: DC | PRN

## 2022-12-03 MED ORDER — ANTICOAGULANT SODIUM CITRATE 4% (200MG/5ML) IV SOLN: 5.0000 mL | Status: DC | PRN

## 2022-12-03 MED ORDER — ALTEPLASE 2 MG IJ SOLR: 2.0000 mg | Freq: Once | INTRAMUSCULAR | Status: DC | PRN

## 2022-12-03 MED ORDER — SEVELAMER CARBONATE 800 MG PO TABS
2400.0000 mg | ORAL_TABLET | Freq: Three times a day (TID) | ORAL | Status: DC
  Administered 2022-12-03 – 2022-12-06 (×6): 2400 mg via ORAL
  Filled 2022-12-03 (×6): qty 3

## 2022-12-03 MED ORDER — PENTAFLUOROPROP-TETRAFLUOROETH EX AERO: 1.0000 | INHALATION_SPRAY | CUTANEOUS | Status: DC | PRN

## 2022-12-03 MED ORDER — HYDRALAZINE HCL 20 MG/ML IJ SOLN: 10.0000 mg | INTRAMUSCULAR | Status: DC | PRN

## 2022-12-03 MED ORDER — IOHEXOL 300 MG/ML  SOLN
150.0000 mL | Freq: Once | INTRAMUSCULAR | Status: AC | PRN
  Administered 2022-12-03: 20 mL via INTRAVENOUS

## 2022-12-03 MED ORDER — ALTEPLASE 2 MG IJ SOLR
INTRAMUSCULAR | Status: AC
  Filled 2022-12-03: qty 4

## 2022-12-03 MED ORDER — FENTANYL CITRATE PF 50 MCG/ML IJ SOSY
25.0000 ug | PREFILLED_SYRINGE | INTRAMUSCULAR | Status: DC | PRN
  Administered 2022-12-03 – 2022-12-04 (×2): 25 ug via INTRAVENOUS
  Filled 2022-12-03 (×2): qty 1

## 2022-12-03 MED ORDER — LIDOCAINE-EPINEPHRINE 1 %-1:100000 IJ SOLN
INTRAMUSCULAR | Status: AC
  Filled 2022-12-03: qty 1

## 2022-12-03 MED ORDER — DOXERCALCIFEROL 4 MCG/2ML IV SOLN
3.0000 ug | INTRAVENOUS | Status: DC
  Administered 2022-12-04: 3 ug via INTRAVENOUS
  Filled 2022-12-03: qty 2

## 2022-12-03 MED ORDER — FENTANYL CITRATE (PF) 100 MCG/2ML IJ SOLN
INTRAMUSCULAR | Status: AC
  Filled 2022-12-03: qty 2

## 2022-12-03 MED ORDER — FENTANYL CITRATE (PF) 100 MCG/2ML IJ SOLN
INTRAMUSCULAR | Status: AC | PRN
  Administered 2022-12-03 (×3): 25 ug via INTRAVENOUS

## 2022-12-03 MED ORDER — LIDOCAINE HCL (PF) 1 % IJ SOLN: 5.0000 mL | INTRAMUSCULAR | Status: DC | PRN

## 2022-12-03 MED ORDER — ONDANSETRON HCL 4 MG/2ML IJ SOLN
INTRAMUSCULAR | Status: AC
  Filled 2022-12-03: qty 2

## 2022-12-03 MED ORDER — ONDANSETRON HCL 4 MG/2ML IJ SOLN
INTRAMUSCULAR | Status: AC | PRN
  Administered 2022-12-03: 4 mg via INTRAVENOUS

## 2022-12-03 MED ORDER — HEPARIN SODIUM (PORCINE) 1000 UNIT/ML IJ SOLN
INTRAMUSCULAR | Status: AC
  Filled 2022-12-03: qty 10

## 2022-12-03 NOTE — Progress Notes (Signed)
[facility] PROGRESS NOTE  Kathleen [name] (DOB: [birth year]) ZOX:[id number] PCP: Hildred Priest, NP  Brief Narrative: Kathleen [name] is a 62 y.o. female with a history of ESRD (HD TTS), recently clotted LUE AVF s/p right IJ [facility] 7/25 who presented to the ED on 12/02/2022 with continued LUE pain. She was overloaded with hyperkalemia having missed dialysis. HD was performed and IR consulted for declot. After procedure, patient suffered hemorrhage which has improved.   Subjective: Feels lightheaded after bleeding episode. No dyspnea.   Objective: BP (!) 130/105 (BP Location: Right Arm)   Pulse 100   Temp 98.4 F (36.9 C) (Oral)   Resp 16   Wt 53 kg   SpO2 100%   BMI 20.06 kg/m   Gen: No distress Pulm: Clear, diminished, nonlabored  CV: Regular tachycardia GI: Soft, NT, ND, +BS  Neuro: Alert and oriented. No new focal deficits. Ext: Warm, no deformities. +TTP left chest wall without visible or palpable deformity.  Skin: No rashes, lesions or ulcers on visualized skin   Assessment & Plan: AVF clot:  - Appreciate IR assistance, s/p declot with some bleeding postprocedure.   ESRD:  - Continue routine HD per nephrology now thru [facility]. Received off schedule early this morning/overnight.   Hyperkalemia:  - Resolved with HD. Low K this AM was drawn while on HD, spurious, confirmed to be normal.   Anemia of CKD with ABLA:  - Trend H/H. Coags ok, T&S UTD. Has symptoms that may be due to hemorrhage. Monitor closely.   [name], MD [facility] [email or web address] 12/03/2022, 3:22 PM

## 2022-12-03 NOTE — Procedures (Signed)
Interventional Radiology Procedure Note  Procedure:   US guided access of left brachio-brachio loop graft/circuit. Thrombosed.   Mechanical and pharmacologic thrombectomy with PTA. Up to 7mm on the outflow, 4mm PTA of the arterial anastamosis.   DEB treatment of the venous outflow stenosis, 7mm DEB  Medication: 5mg  tPA .  Findings: Thrombosed to start Restored flow with excellent thrill at completion.   Complications: None  Recommendations:  - Ok to use - Do not submerge for 7 days - Routine care  - Stay sutures may be removed at HD.  Signed,  Yvone Neu. Loreta Ave, DO

## 2022-12-03 NOTE — ED Notes (Signed)
Pt given warm blankets at this time 

## 2022-12-03 NOTE — Progress Notes (Addendum)
Pt receives out-pt HD at [facility] on TTS. Will assist as needed.   Olivia Canter Renal Navigator [phone]  Addendum at 3:33 pm: Pt's case discussed with attending and nephrologist. Pt not stable for d/c today. Contacted clinic to inquire about 2nd shift appt for pt tomorrow in an attempt to avoid HD on day of d/c. Clinic does not have a 2nd shift appt available. Update provided to attending, nephrologist, and renal PA.

## 2022-12-03 NOTE — Consult Note (Signed)
Renal Service Consult Note [facility]  Kathleen [name] 12/03/2022 [name], MD Requesting Physician: [name]  Reason for Consult: ESRD pt w/ missed HD, clotted AVF HPI: The patient is a 62 y.o. year-old w/ PMH as below who presented to ED w/ recently clotted AVF and L upper arm pain. Also missed HD. Pt had last full HD on Sat 7/20. On 7/23, her AVG was found to be clotted. She went to [facility] on 7/25 for declot, but she was having too much pain to be declotted so a TDC was placed. Pt presented to ED then last night for declot evaluation by another service. Pt was admitted and IR consulted. Pt had dialysis this am from approx 1am -5 am. We are asked to see for esrd.    Pt seen in ED.  Still having pain in L upper arm over the access. No other c/o's. No sob or leg swelling. Dialysis was uneventful this am.    ROS - denies CP, no joint pain, no HA, no blurry vision, no rash, no diarrhea, no nausea/ vomiting   Past Medical History  Past Medical History:  Diagnosis Date   Anemia    Arthritis    Cardiac arrest (HCC)    Cellulitis    Cerebral vasculitis    COPD (chronic obstructive pulmonary disease) (HCC)    Elevated antinuclear antibody (ANA) level    Endometriosis    ESRD (end stage renal disease) on dialysis (HCC)    Gallstones    Hypertension    Lupus (HCC)    Migraine    PONV (postoperative nausea and vomiting)    Seizures (HCC)    Sepsis (HCC)    Sleep apnea    Suicide attempt (HCC)    TB (tuberculosis)    Transient ischemic attack (TIA)    Vasculitis of skin    Past Surgical History  Past Surgical History:  Procedure Laterality Date   AV FISTULA PLACEMENT Left 07/04/2017   Procedure: ARTERIOVENOUS (AV) FISTULA CREATION;  Surgeon: Fransisco Hertz, MD;  Location: [facility] OR;  Service: Vascular;  Laterality: Left;   AV FISTULA PLACEMENT Left 11/27/2019   Procedure: INSERTION OF ARTERIOVENOUS (AV) GORE-TEX GRAFT LEFT UPPER ARM;  Surgeon: Chuck Hint, MD;  Location: [facility] OR;  Service: Vascular;  Laterality: Left;   AV FISTULA PLACEMENT Left 07/29/2020   Procedure: INSERTION OF LEFT UPPER ARM ARTERIOVENOUS (AV) GORE-TEX GRAFT;  Surgeon: Chuck Hint, MD;  Location: [facility] OR;  Service: Vascular;  Laterality: Left;   BIOPSY  12/23/2021   Procedure: BIOPSY;  Surgeon: Tressia Danas, MD;  Location: [facility] ENDOSCOPY;  Service: Gastroenterology;;   BUBBLE STUDY  05/25/2021   Procedure: BUBBLE STUDY;  Surgeon: Sande Rives, MD;  Location: [facility] ENDOSCOPY;  Service: Cardiovascular;;   CESAREAN SECTION     COLONOSCOPY WITH PROPOFOL N/A 12/23/2021   Procedure: COLONOSCOPY WITH PROPOFOL;  Surgeon: Tressia Danas, MD;  Location: [facility] ENDOSCOPY;  Service: Gastroenterology;  Laterality: N/A;   ESOPHAGOGASTRODUODENOSCOPY N/A 07/15/2016   Procedure: ESOPHAGOGASTRODUODENOSCOPY (EGD);  Surgeon: Carman Ching, MD;  Location: [facility] ENDOSCOPY;  Service: Endoscopy;  Laterality: N/A;   ESOPHAGOGASTRODUODENOSCOPY N/A 12/23/2021   Procedure: ESOPHAGOGASTRODUODENOSCOPY (EGD);  Surgeon: Tressia Danas, MD;  Location: [facility] ENDOSCOPY;  Service: Gastroenterology;  Laterality: N/A;   I & D EXTREMITY Left 05/27/2021   Procedure: IRRIGATION AND DEBRIDEMENT OF WRIST;  Surgeon: Bradly Bienenstock, MD;  Location: [facility] OR;  Service: Orthopedics;  Laterality: Left;   INSERTION OF DIALYSIS CATHETER Right  07/04/2017   Procedure: INSERTION OF TUNNELED  DIALYSIS CATHETER - RIGHT INTERNAL JUGULAR PLACEMENT;  Surgeon: Fransisco Hertz, MD;  Location: [facility] OR;  Service: Vascular;  Laterality: Right;   IR FLUORO GUIDE CV LINE RIGHT  09/08/2021   IR THROMBECTOMY AV FISTULA W/THROMBOLYSIS/PTA INC/SHUNT/IMG LEFT Left 09/10/2021   IR US GUIDE VASC ACCESS LEFT  09/10/2021   IR US GUIDE VASC ACCESS RIGHT  09/08/2021   LUNG BIOPSY     POLYPECTOMY  12/23/2021   Procedure: POLYPECTOMY;  Surgeon: Tressia Danas, MD;  Location: [facility] ENDOSCOPY;  Service: Gastroenterology;;   TEE WITHOUT CARDIOVERSION  N/A 05/25/2021   Procedure: TRANSESOPHAGEAL ECHOCARDIOGRAM (TEE);  Surgeon: Sande Rives, MD;  Location: [facility] ENDOSCOPY;  Service: Cardiovascular;  Laterality: N/A;   TUBAL LIGATION     Family History  Family History  Problem Relation Age of Onset   Ovarian cancer Mother    Breast cancer Sister    Throat cancer Brother    Social History  reports that she has been smoking cigarettes. She has never used smokeless tobacco. She reports that she does not drink alcohol and does not use drugs. Allergies No Known Allergies Home medications Prior to Admission medications   Medication Sig Start Date End Date Taking? Authorizing Provider  acetaminophen (TYLENOL) 160 MG/5ML liquid Take 320 mg by mouth every 6 (six) hours as needed for fever or pain.   Yes [provider]  ferrous sulfate 325 (65 FE) MG tablet Take 325 mg by mouth daily.   Yes [provider]  multivitamin (RENA-VIT) TABS tablet Take 1 tablet by mouth daily.   Yes [provider]  pantoprazole (PROTONIX) 40 MG tablet Take 1 tablet (40 mg total) by mouth 2 (two) times daily. 10/30/22 01/15/23 Yes [name], [name], MD  sevelamer carbonate (RENVELA) 800 MG tablet Take 800-2,400 mg by mouth See admin instructions. 2400 mg three times daily with meals, 800 mg with snacks. 08/28/21  Yes [provider]     Vitals:   12/03/22 0700 12/03/22 0730 12/03/22 0745 12/03/22 0935  BP: (!) 128/91 (!) 151/98 124/89   Pulse:   92   Resp: 17 (!) 27 19   Temp:    98.4 F (36.9 C)  TempSrc:    Oral  SpO2:  98% 98%   Weight:       Exam Gen alert, no distress, on room air No rash, cyanosis or gangrene Sclera anicteric, throat clear  No jvd or bruits Chest clear bilat to bases, no rales/ wheezing RRR no MRG Abd soft ntnd no mass or ascites +bs GU defer MS no joint effusions or deformity Ext no LE or UE edema, no wounds or ulcers Neuro is alert, Ox 3 , nf     LUA AVG tender, no bruit, no drainage or  erythema    RIJ TDC in place    Home meds include - tylenol, ferrous sulfate, renavite, pantoprazole, sevelamer carb 3 ac tid    OP HD: TTS [facility]  3h  350/ 1.5   50.3kg   2/2 bath  AVG   Heparin 3600  - last OP HD was 7/20, post wt 50kg   - sensipar 60mg  po three times per week - hectorol 3 mcg IV three times per week - venofer 100mg  q hd IV thru 8/13 - mircera IV q 2 wks, last 7/16, due 7/30   Assessment/ Plan: Clotted L AVG - IR has consulted and they will try to do  declot of the AVG today.  TDC was placed 7/25 at [facility].  ESRD - on HD TTS. Missed 2 HD sessions due to #1. Had HD here early this am using newly placed [facility]. Labs much better but were drawn it appears during the dialysis session, so will need to be redone. Next HD tomorrow.  HTN/ volume - 2.7 L off w/ HD today, euvolemic on exam. Get standing wt when able.   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  MBD ckd - CCa and phos are in range. Cont sensipar po, IV vdra and renvela as binder.    Vinson Moselle  MD [facility] 12/03/2022, 10:02 AM  Recent Labs  Lab 12/02/22 2128 12/03/22 0430  HGB 12.2 12.5  ALBUMIN  --  3.3*  CALCIUM  --  9.1  PHOS  --  3.4  CREATININE >18.00* 6.48*  K 5.7* 2.8*   Inpatient medications:  Chlorhexidine Gluconate Cloth  6 each Topical Q0600   Chlorhexidine Gluconate Cloth  6 each Topical Q0600   [START ON 12/04/2022] cinacalcet  60 mg Oral Q T,Th,Sat-1800   [START ON 12/04/2022] doxercalciferol  3 mcg Intravenous Q T,Th,Sa-HD    acetaminophen **OR** acetaminophen, fentaNYL (SUBLIMAZE) injection, hydrALAZINE, naLOXone (NARCAN)  injection

## 2022-12-03 NOTE — Consult Note (Signed)
Chief Complaint: Patient was seen in consultation today for  Chief Complaint  Patient presents with   Vascular Access Problem   Referring Physician(s): Dr. Kathrene Bongo   Supervising Physician: Gilmer Mor  Patient Status: [facility] - ED  History of Present Illness: Kathleen [name] is a 62 y.o. female with a medical history significant for cardiac arrest, cerebral vasculitis, COPD, HTN, Lupus, seizures and ESRD on hemodialysis. She receives treatment via a left upper extremity fistula which was created in 2019. It has been revised several time with the most recent one (graft placement) performed in 2022. She is familiar to IR from prior catheter placements and fistulograms.   She attempted her scheduled hemodialysis 7/23 but this was stopped early due to pain at the graft site. She was evaluated by [facility] 12/02/22 and was having too much pain in her graft to be declotted so a tunneled dialysis catheter was placed. She was then sent to the ED 12/02/22 for further treatment. She underwent hemodialysis last night.   Interventional Radiology has been asked to evaluate this patient for an image-guided left upper extremity fistulogram with possible intervention.   Past Medical History:  Diagnosis Date   Anemia    Arthritis    Cardiac arrest (HCC)    Cellulitis    Cerebral vasculitis    COPD (chronic obstructive pulmonary disease) (HCC)    Elevated antinuclear antibody (ANA) level    Endometriosis    ESRD (end stage renal disease) on dialysis (HCC)    Gallstones    Hypertension    Lupus (HCC)    Migraine    PONV (postoperative nausea and vomiting)    Seizures (HCC)    Sepsis (HCC)    Sleep apnea    Suicide attempt (HCC)    TB (tuberculosis)    Transient ischemic attack (TIA)    Vasculitis of skin     Past Surgical History:  Procedure Laterality Date   AV FISTULA PLACEMENT Left 07/04/2017   Procedure: ARTERIOVENOUS (AV) FISTULA CREATION;  Surgeon: Fransisco Hertz, MD;   Location: [facility] OR;  Service: Vascular;  Laterality: Left;   AV FISTULA PLACEMENT Left 11/27/2019   Procedure: INSERTION OF ARTERIOVENOUS (AV) GORE-TEX GRAFT LEFT UPPER ARM;  Surgeon: Chuck Hint, MD;  Location: [facility] OR;  Service: Vascular;  Laterality: Left;   AV FISTULA PLACEMENT Left 07/29/2020   Procedure: INSERTION OF LEFT UPPER ARM ARTERIOVENOUS (AV) GORE-TEX GRAFT;  Surgeon: Chuck Hint, MD;  Location: [facility] OR;  Service: Vascular;  Laterality: Left;   BIOPSY  12/23/2021   Procedure: BIOPSY;  Surgeon: Tressia Danas, MD;  Location: [facility] ENDOSCOPY;  Service: Gastroenterology;;   BUBBLE STUDY  05/25/2021   Procedure: BUBBLE STUDY;  Surgeon: Sande Rives, MD;  Location: [facility] ENDOSCOPY;  Service: Cardiovascular;;   CESAREAN SECTION     COLONOSCOPY WITH PROPOFOL N/A 12/23/2021   Procedure: COLONOSCOPY WITH PROPOFOL;  Surgeon: Tressia Danas, MD;  Location: [facility] ENDOSCOPY;  Service: Gastroenterology;  Laterality: N/A;   ESOPHAGOGASTRODUODENOSCOPY N/A 07/15/2016   Procedure: ESOPHAGOGASTRODUODENOSCOPY (EGD);  Surgeon: Carman Ching, MD;  Location: [facility] ENDOSCOPY;  Service: Endoscopy;  Laterality: N/A;   ESOPHAGOGASTRODUODENOSCOPY N/A 12/23/2021   Procedure: ESOPHAGOGASTRODUODENOSCOPY (EGD);  Surgeon: Tressia Danas, MD;  Location: [facility] ENDOSCOPY;  Service: Gastroenterology;  Laterality: N/A;   I & D EXTREMITY Left 05/27/2021   Procedure: IRRIGATION AND DEBRIDEMENT OF WRIST;  Surgeon: Bradly Bienenstock, MD;  Location: [facility] OR;  Service: Orthopedics;  Laterality: Left;   INSERTION OF DIALYSIS CATHETER Right  07/04/2017   Procedure: INSERTION OF TUNNELED  DIALYSIS CATHETER - RIGHT INTERNAL JUGULAR PLACEMENT;  Surgeon: Fransisco Hertz, MD;  Location: [facility] OR;  Service: Vascular;  Laterality: Right;   IR FLUORO GUIDE CV LINE RIGHT  09/08/2021   IR THROMBECTOMY AV FISTULA W/THROMBOLYSIS/PTA INC/SHUNT/IMG LEFT Left 09/10/2021   IR US GUIDE VASC ACCESS LEFT  09/10/2021   IR US GUIDE VASC ACCESS RIGHT   09/08/2021   LUNG BIOPSY     POLYPECTOMY  12/23/2021   Procedure: POLYPECTOMY;  Surgeon: Tressia Danas, MD;  Location: [facility] ENDOSCOPY;  Service: Gastroenterology;;   TEE WITHOUT CARDIOVERSION N/A 05/25/2021   Procedure: TRANSESOPHAGEAL ECHOCARDIOGRAM (TEE);  Surgeon: Sande Rives, MD;  Location: [facility] ENDOSCOPY;  Service: Cardiovascular;  Laterality: N/A;   TUBAL LIGATION      Allergies: Patient has no known allergies.  Medications: Prior to Admission medications   Medication Sig Start Date End Date Taking? Authorizing Provider  acetaminophen (TYLENOL) 160 MG/5ML liquid Take 320 mg by mouth every 6 (six) hours as needed for fever or pain.   Yes [provider]  ferrous sulfate 325 (65 FE) MG tablet Take 325 mg by mouth daily.   Yes [provider]  multivitamin (RENA-VIT) TABS tablet Take 1 tablet by mouth daily.   Yes [provider]  pantoprazole (PROTONIX) 40 MG tablet Take 1 tablet (40 mg total) by mouth 2 (two) times daily. 10/30/22 01/15/23 Yes [name], [name], MD  sevelamer carbonate (RENVELA) 800 MG tablet Take 800-2,400 mg by mouth See admin instructions. 2400 mg three times daily with meals, 800 mg with snacks. 08/28/21  Yes [provider]     Family History  Problem Relation Age of Onset   Ovarian cancer Mother    Breast cancer Sister    Throat cancer Brother     Social History   Socioeconomic History   Marital status: Significant Other    Spouse name: Not on file   Number of children: Not on file   Years of education: Not on file   Highest education level: Not on file  Occupational History   Not on file  Tobacco Use   Smoking status: Some Days    Current packs/day: 0.25    Types: Cigarettes   Smokeless tobacco: Never  Vaping Use   Vaping status: Never Used  Substance and Sexual Activity   Alcohol use: No   Drug use: No   Sexual activity: Not Currently    Birth control/protection: Post-menopausal  Other Topics Concern    Not on file  Social History Narrative   Not on file   Social Determinants of Health   Financial Resource Strain: Not on file  Food Insecurity: Unknown (08/22/2022)   [facility]   Food vital sign    Within the past 12 months, you worried that your food would run out before you got money to buy more: Patient declined to answer    Within the past 12 months, the food you bought just didn't last and you didn't have money to get more. : Patient declined to answer  Transportation Needs: Not on file (08/22/2022)  Physical Activity: Not on file  Stress: Not on file  Social Connections: Not on file    Review of Systems: A 12 point ROS discussed and pertinent positives are indicated in the HPI above.  All other systems are negative.  Review of Systems  Constitutional:  Positive for appetite change and fatigue.  Respiratory:  Negative  for cough and shortness of breath.   Cardiovascular:  Negative for chest pain and leg swelling.  Gastrointestinal:  Positive for diarrhea. Negative for abdominal pain, nausea and vomiting.  Neurological:  Positive for dizziness. Negative for headaches.    Vital Signs: BP 124/89 (BP Location: Right Arm)   Pulse 92   Temp 98.4 F (36.9 C) (Oral)   Resp 19   Wt 116 lb 13.5 oz (53 kg)   SpO2 98%   BMI 20.06 kg/m   Physical Exam Constitutional:      General: She is not in acute distress.    Appearance: She is not ill-appearing.  HENT:     Mouth/Throat:     Mouth: Mucous membranes are moist.     Pharynx: Oropharynx is clear.  Cardiovascular:     Rate and Rhythm: Normal rate and regular rhythm.     Comments: Left upper extremity graft negative for thrill and bruit. Site is very tender to palpation. No redness, warmth or drainage observed.  Pulmonary:     Effort: Pulmonary effort is normal.  Abdominal:     Palpations: Abdomen is soft.     Tenderness: There is no abdominal tenderness.  Musculoskeletal:     Right lower  leg: No edema.     Left lower leg: No edema.  Skin:    General: Skin is warm and dry.  Neurological:     Mental Status: She is alert and oriented to person, place, and time.     Imaging: No results found.  Labs:  CBC: Recent Labs    10/29/22 1253 10/30/22 0138 12/02/22 2109 12/02/22 2128 12/03/22 0430  WBC 5.8 5.1 7.0  --  6.2  HGB 11.9* 10.2* 11.3* 12.2 12.5  HCT 39.5 32.8* 36.6 36.0 39.8  PLT 172 149* 200  --  202    COAGS: Recent Labs    10/27/22 1445 10/28/22 0132  INR 1.1 1.1  APTT  --  29    BMP: Recent Labs    10/28/22 0132 10/29/22 0143 10/30/22 0138 12/02/22 2128 12/03/22 0430  NA 136 132* 130* 135 134*  K 4.0 3.3* 4.0 5.7* 2.8*  CL 95* 94* 88* 105 93*  CO2 25 25 26   --  25  GLUCOSE 87 76 88 73 73  BUN 22 11 22  66* 20  CALCIUM 9.3 8.9 9.2  --  9.1  CREATININE 10.54* 6.14* 8.74* >18.00* 6.48*  GFRNONAA 4* 7* 5*  --  7*    LIVER FUNCTION TESTS: Recent Labs    12/19/21 2218 12/21/21 1834 12/22/21 1430 10/27/22 1445 10/28/22 0132 10/29/22 0143 10/30/22 0138 12/03/22 0430  BILITOT 0.6 0.3  --  0.5  --   --   --  0.9  AST 32 27  --  33  --   --   --  25  ALT 14 9  --  14  --   --   --  12  ALKPHOS 76 70  --  83  --   --   --  74  PROT 8.8* 8.3*  --  9.9*  --   --   --  8.6*  ALBUMIN 4.0 3.8   < > 4.4 3.3* 3.0* 3.1* 3.3*   < > = values in this interval not displayed.    TUMOR MARKERS: No results for input(s): "AFPTM", "CEA", "CA199", "CHROMGRNA" in the last 8760 hours.  Assessment and Plan:  Malfunctioning left AV graft: Kathleen [name], 62 year old female, is tentatively scheduled  today for an image-guided left upper extremity fistulogram with possible intervention. Potassium level is currently 2.8. Procedure may be on hold until this lab value is within a normal range.   Risks and benefits discussed with the patient including, but not limited to bleeding, infection, vascular injury, pulmonary embolism, need for tunneled HD  catheter placement or even death.  All of the patient's questions were answered, patient is agreeable to proceed. She has been NPO.   Consent signed and in IR.   Thank you for this interesting consult.  I greatly enjoyed meeting Kathleen [name] and look forward to participating in their care.  A copy of this report was sent to the requesting provider on this date.  Electronically Signed: Alwyn Ren, AGACNP-BC [phone] 12/03/2022, 9:06 AM   I spent a total of 20 Minutes    in face to face in clinical consultation, greater than 50% of which was counseling/coordinating care for fistulogram.

## 2022-12-04 ENCOUNTER — Observation Stay (HOSPITAL_COMMUNITY): Payer: Medicare HMO

## 2022-12-04 DIAGNOSIS — I120 Hypertensive chronic kidney disease with stage 5 chronic kidney disease or end stage renal disease: Secondary | ICD-10-CM | POA: Diagnosis present

## 2022-12-04 DIAGNOSIS — E875 Hyperkalemia: Secondary | ICD-10-CM | POA: Diagnosis present

## 2022-12-04 DIAGNOSIS — Z8673 Personal history of transient ischemic attack (TIA), and cerebral infarction without residual deficits: Secondary | ICD-10-CM | POA: Diagnosis not present

## 2022-12-04 DIAGNOSIS — J449 Chronic obstructive pulmonary disease, unspecified: Secondary | ICD-10-CM | POA: Diagnosis present

## 2022-12-04 DIAGNOSIS — I871 Compression of vein: Secondary | ICD-10-CM | POA: Diagnosis present

## 2022-12-04 DIAGNOSIS — Z01810 Encounter for preprocedural cardiovascular examination: Secondary | ICD-10-CM | POA: Diagnosis not present

## 2022-12-04 DIAGNOSIS — F17210 Nicotine dependence, cigarettes, uncomplicated: Secondary | ICD-10-CM | POA: Diagnosis present

## 2022-12-04 DIAGNOSIS — Y832 Surgical operation with anastomosis, bypass or graft as the cause of abnormal reaction of the patient, or of later complication, without mention of misadventure at the time of the procedure: Secondary | ICD-10-CM | POA: Diagnosis present

## 2022-12-04 DIAGNOSIS — D62 Acute posthemorrhagic anemia: Secondary | ICD-10-CM | POA: Diagnosis not present

## 2022-12-04 DIAGNOSIS — Z86718 Personal history of other venous thrombosis and embolism: Secondary | ICD-10-CM | POA: Diagnosis not present

## 2022-12-04 DIAGNOSIS — T82868A Thrombosis of vascular prosthetic devices, implants and grafts, initial encounter: Secondary | ICD-10-CM | POA: Diagnosis not present

## 2022-12-04 DIAGNOSIS — T82511A Breakdown (mechanical) of surgically created arteriovenous shunt, initial encounter: Secondary | ICD-10-CM

## 2022-12-04 DIAGNOSIS — Z91158 Patient's noncompliance with renal dialysis for other reason: Secondary | ICD-10-CM | POA: Diagnosis not present

## 2022-12-04 DIAGNOSIS — T82818A Embolism of vascular prosthetic devices, implants and grafts, initial encounter: Secondary | ICD-10-CM | POA: Diagnosis present

## 2022-12-04 DIAGNOSIS — Z992 Dependence on renal dialysis: Secondary | ICD-10-CM | POA: Diagnosis not present

## 2022-12-04 DIAGNOSIS — M898X9 Other specified disorders of bone, unspecified site: Secondary | ICD-10-CM | POA: Diagnosis present

## 2022-12-04 DIAGNOSIS — Y712 Prosthetic and other implants, materials and accessory cardiovascular devices associated with adverse incidents: Secondary | ICD-10-CM | POA: Diagnosis present

## 2022-12-04 DIAGNOSIS — T829XXA Unspecified complication of cardiac and vascular prosthetic device, implant and graft, initial encounter: Secondary | ICD-10-CM | POA: Diagnosis not present

## 2022-12-04 DIAGNOSIS — Z79899 Other long term (current) drug therapy: Secondary | ICD-10-CM | POA: Diagnosis not present

## 2022-12-04 DIAGNOSIS — N186 End stage renal disease: Secondary | ICD-10-CM | POA: Diagnosis present

## 2022-12-04 DIAGNOSIS — D631 Anemia in chronic kidney disease: Secondary | ICD-10-CM | POA: Diagnosis present

## 2022-12-04 MED ORDER — HEPARIN SODIUM (PORCINE) 1000 UNIT/ML IJ SOLN
INTRAMUSCULAR | Status: AC
  Filled 2022-12-04: qty 4

## 2022-12-04 MED ORDER — OXYCODONE-ACETAMINOPHEN 5-325 MG PO TABS
1.0000 | ORAL_TABLET | Freq: Once | ORAL | Status: AC
  Administered 2022-12-04: 1 via ORAL
  Filled 2022-12-04: qty 1

## 2022-12-04 MED ORDER — HEPARIN SODIUM (PORCINE) 1000 UNIT/ML DIALYSIS
2000.0000 [IU] | Freq: Once | INTRAMUSCULAR | Status: AC
  Administered 2022-12-04: 2000 [IU] via INTRAVENOUS_CENTRAL
  Filled 2022-12-04 (×2): qty 2

## 2022-12-04 MED ORDER — HEPARIN SODIUM (PORCINE) 1000 UNIT/ML DIALYSIS
1600.0000 [IU] | INTRAMUSCULAR | Status: DC | PRN
  Filled 2022-12-04 (×2): qty 2

## 2022-12-04 MED ORDER — HYDROMORPHONE HCL 1 MG/ML IJ SOLN: 0.5000 mg | INTRAMUSCULAR | Status: AC | PRN

## 2022-12-04 NOTE — ED Notes (Signed)
Pt received for care at this time.  Bed in lowest position, wheels locked.  Call bell within reach.

## 2022-12-04 NOTE — Progress Notes (Addendum)
[facility] Progress Note  Subjective: seen in HD, using TDC, still having lots of access pain and tenderness  Vitals:   12/04/22 0618 12/04/22 0759 12/04/22 0810 12/04/22 0830  BP: (!) 140/86 106/73 93/73 100/74  Pulse: 94 81 77 70  Resp: 16 20 16 18   Temp: 98.1 F (36.7 C) 98 F (36.7 C)    TempSrc: Oral     SpO2: 100% 98% 98% 100%  Weight:        Exam: Gen alert, no distress No jvd or bruits Chest clear bilat to bases RRR no MRG Abd soft ntnd no mass or ascites +bs Ext no LE edema Neuro is alert, Ox 3 , nf     LUA AVG tender, +bruit today, no erythema or drainage    RIJ TDC in place      Home meds include - tylenol, ferrous sulfate, renavite, pantoprazole, sevelamer carb 3 ac tid      OP HD: TTS [facility]  3h  350/ 1.5   50.3kg   2/2 bath  AVG   Heparin 3600  - last OP HD was 7/20, post wt 50kg   - sensipar 60mg  po three times per week - hectorol 3 mcg IV three times per week - venofer 100mg  q hd IV thru 8/13 - mircera IV q 2 wks, last 7/16, due 7/30     Assessment/ Plan: Clotted L AVG - sp declot AVG by IR yesterday 7/26, greatly appreciate assistance. +bruit today.  TDC was placed 7/25 at [facility]. Still having a lot of access pain so we are using [facility] for now.  Access pain - no fever but will get blood cx's just in case given severity of pain / tenderness of the AVG.  ESRD - on HD TTS. Missed 2 HD sessions due to #1. Had HD here 7/25 overnight using TDC. Next HD today in progress.  HTN/ volume - 2.5 kg up by wts, euvolemic on exam. 2-3 ufg.  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  MBD ckd - CCa and phos are in range. Cont sensipar po, IV vdra and renvela as binder.    Vinson Moselle MD  [facility] 12/04/2022, 8:38 AM  Recent Labs  Lab 12/03/22 0430 12/03/22 0956 12/03/22 1300 12/04/22 0231  HGB 12.5  --  12.6 12.1  ALBUMIN 3.3*  --   --  2.9*  CALCIUM 9.1 8.9  --  9.0  PHOS 3.4  --   --  9.5*  CREATININE 6.48* 9.69*  --   10.88*  K 2.8* 4.3  --  4.4   No results for input(s): "IRON", "TIBC", "FERRITIN" in the last 168 hours. Inpatient medications:  Chlorhexidine Gluconate Cloth  6 each Topical Q0600   cinacalcet  60 mg Oral Q T,Th,Sat-1800   doxercalciferol  3 mcg Intravenous Q T,Th,Sa-HD   [START ON 12/05/2022] heparin  2,000 Units Dialysis Once in dialysis   lidocaine-EPINEPHrine  20 mL Intradermal Once   sevelamer carbonate  2,400 mg Oral TID WC    acetaminophen **OR** acetaminophen, fentaNYL (SUBLIMAZE) injection, [START ON 12/05/2022] heparin, hydrALAZINE, HYDROmorphone (DILAUDID) injection, naLOXone (NARCAN)  injection

## 2022-12-04 NOTE — Progress Notes (Signed)
[facility] PROGRESS NOTE  Marjo Bietz (DOB: [birth year]) ZOX:[id number] PCP: Hildred Priest, NP  Brief Narrative: Kathleen [name] is a 62 y.o. female with a history of ESRD (HD TTS), recently clotted LUE AVF s/p right IJ [facility] 7/25 who presented to the ED on 12/02/2022 with continued LUE pain. She was overloaded with hyperkalemia having missed dialysis. HD was performed and IR consulted for declot. After procedure, patient suffered hemorrhage which has improved. The pain in her left upper arm continues. Blood cultures to be drawn and duplex ordered.  Subjective: Pain in left AVG site is 10/10, also still uncomfortable right TDC but operational. No fevers or chills.   Objective: BP 91/68   Pulse 87   Temp 98 F (36.7 C)   Resp 14   Wt 53 kg   SpO2 100%   BMI 20.06 kg/m   Gen: In apparent discomfort receiving HD Pulm: Clear, nonlabored  CV: RRR, no MRG GI: Soft, NT, ND, +BS. Neuro: Alert and oriented. No new focal deficits. Ext: Warm, dry. LUE AVG with diminished thrill, exquisitely tender to palpation without erythema or fluctuance. Right IJ TDC appears healthy without erythema. Skin appears hypersensitive throughout.  Skin: No other/new rashes, lesions or ulcers on visualized skin   Assessment & Plan: Left AVG clot:  - Appreciate IR assistance, s/p declot 7/26 with some bleeding postprocedure which has subsided. Pain in the area and decreased thrill concerning. D/w IR and nephrology. We'll draw blood cultures for infection r/o and have low threshold for abx (e.g. vancomycin). IR to evaluate, start with duplex, may ultimately need surgical revision.  ESRD:  - Continue routine HD TTS per nephrology now thru [facility]. s/p 7/26 then back on schedule 7/27.   Hyperkalemia:  - Resolved with HD.    Anemia of CKD with acute blood loss without anemia:  - Monitor intermittently.  [name], MD [facility] [email or web address] 12/04/2022, 9:20 AM

## 2022-12-04 NOTE — Progress Notes (Signed)
VASCULAR LAB    Left upper extremity duplex of graft has been performed.  See CV proc for preliminary results.   Latrese Carolan, RVT 12/04/2022, 2:00 PM

## 2022-12-04 NOTE — ED Notes (Signed)
Patient transported to vascular. 

## 2022-12-04 NOTE — ED Notes (Signed)
ED TO INPATIENT HANDOFF REPORT  ED Nurse Name and Phone #: Joselyn Glassman [phone])  S Name/Age/Gender Kathleen [name] 62 y.o. female Room/Bed: 037C/037C  Code Status   Code Status: Full Code  Home/SNF/Other Home Patient oriented to: self, place, time, and situation Is this baseline? Yes   Triage Complete: Triage complete  Chief Complaint Hyperkalemia [E87.5] ESRD (end stage renal disease) (HCC) [N18.6] Complication of arteriovenous dialysis fistula [T82.9XXA]  Triage Note Pt fistula in left upper arm clogged and was unable to be used on Tuesday. Pt went to vein and vascular today and was told it should not be so painful and to come here to have it evaluated. Pt states pain hurts up to armpit a.   Pt had new access placed in right upper chest and needs dialysis. Last dialysis was Saturday.    Allergies No Known Allergies  Level of Care/Admitting Diagnosis ED Disposition     ED Disposition  Admit   Condition  --   Comment  Hospital [facility]: [facility] [[id number]]  Level of Care: Progressive [102]  Admit to Progressive based on following criteria: MULTISYSTEM THREATS such as stable sepsis, metabolic/electrolyte imbalance with or without encephalopathy that is responding to early treatment.  May admit patient to [facility] or [facility] if equivalent level of care is available:: No  Covid Evaluation: Asymptomatic - no recent exposure (last 10 days) testing not required  Diagnosis: Complication of arteriovenous dialysis fistula [829562]  Admitting Physician: Angie Fava [[id number]]  Attending Physician: [name] [id number]  Certification:: I certify this patient will need inpatient services for at least 2 midnights          B Medical/Surgery History Past Medical History:  Diagnosis Date   Anemia    Arthritis    Cardiac arrest (HCC)    Cellulitis    Cerebral vasculitis    COPD (chronic obstructive pulmonary disease) (HCC)    Elevated  antinuclear antibody (ANA) level    Endometriosis    ESRD (end stage renal disease) on dialysis (HCC)    Gallstones    Hypertension    Lupus (HCC)    Migraine    PONV (postoperative nausea and vomiting)    Seizures (HCC)    Sepsis (HCC)    Sleep apnea    Suicide attempt (HCC)    TB (tuberculosis)    Transient ischemic attack (TIA)    Vasculitis of skin    Past Surgical History:  Procedure Laterality Date   AV FISTULA PLACEMENT Left 07/04/2017   Procedure: ARTERIOVENOUS (AV) FISTULA CREATION;  Surgeon: Fransisco Hertz, MD;  Location: [facility] OR;  Service: Vascular;  Laterality: Left;   AV FISTULA PLACEMENT Left 11/27/2019   Procedure: INSERTION OF ARTERIOVENOUS (AV) GORE-TEX GRAFT LEFT UPPER ARM;  Surgeon: Chuck Hint, MD;  Location[facility] OR;  Service: Vascular;  Laterality: Left;   AV FISTULA PLACEMENT Left 07/29/2020   Procedure: INSERTION OF LEFT UPPER ARM ARTERIOVENOUS (AV) GORE-TEX GRAFT;  Surgeon: Chuck Hint, MD;  Location: [facility] OR;  Service: Vascular;  Laterality: Left;   BIOPSY  12/23/2021   Procedure: BIOPSY;  Surgeon: Tressia Danas, MD;  Location: [facility] ENDOSCOPY;  Service: Gastroenterology;;   BUBBLE STUDY  05/25/2021   Procedure: BUBBLE STUDY;  Surgeon: Sande Rives, MD;  Location: [facility] ENDOSCOPY;  Service: Cardiovascular;;   CESAREAN SECTION     COLONOSCOPY WITH PROPOFOL N/A 12/23/2021   Procedure: COLONOSCOPY WITH PROPOFOL;  Surgeon: Tressia Danas, MD;  Location: [facility] ENDOSCOPY;  Service: Gastroenterology;  Laterality: N/A;   ESOPHAGOGASTRODUODENOSCOPY N/A 07/15/2016   Procedure: ESOPHAGOGASTRODUODENOSCOPY (EGD);  Surgeon: Carman Ching, MD;  Location: [facility] ENDOSCOPY;  Service: Endoscopy;  Laterality: N/A;   ESOPHAGOGASTRODUODENOSCOPY N/A 12/23/2021   Procedure: ESOPHAGOGASTRODUODENOSCOPY (EGD);  Surgeon: Tressia Danas, MD;  Location: [facility] ENDOSCOPY;  Service: Gastroenterology;  Laterality: N/A;   I & D EXTREMITY Left 05/27/2021   Procedure: IRRIGATION AND  DEBRIDEMENT OF WRIST;  Surgeon: Bradly Bienenstock, MD;  Location: [facility] OR;  Service: Orthopedics;  Laterality: Left;   INSERTION OF DIALYSIS CATHETER Right 07/04/2017   Procedure: INSERTION OF TUNNELED  DIALYSIS CATHETER - RIGHT INTERNAL JUGULAR PLACEMENT;  Surgeon: Fransisco Hertz, MD;  Location: [facility] OR;  Service: Vascular;  Laterality: Right;   IR FLUORO GUIDE CV LINE RIGHT  09/08/2021   IR THROMBECTOMY AV FISTULA W/THROMBOLYSIS INC/SHUNT/IMG LEFT Left 12/03/2022   IR THROMBECTOMY AV FISTULA W/THROMBOLYSIS/PTA INC/SHUNT/IMG LEFT Left 09/10/2021   IR US GUIDE VASC ACCESS LEFT  09/10/2021   IR US GUIDE VASC ACCESS LEFT  12/03/2022   IR US GUIDE VASC ACCESS RIGHT  09/08/2021   LUNG BIOPSY     POLYPECTOMY  12/23/2021   Procedure: POLYPECTOMY;  Surgeon: Tressia Danas, MD;  Location: [facility] ENDOSCOPY;  Service: Gastroenterology;;   TEE WITHOUT CARDIOVERSION N/A 05/25/2021   Procedure: TRANSESOPHAGEAL ECHOCARDIOGRAM (TEE);  Surgeon: Sande Rives, MD;  Location: [facility] ENDOSCOPY;  Service: Cardiovascular;  Laterality: N/A;   TUBAL LIGATION       A IV Location/Drains/Wounds Patient Lines/Drains/Airways Status     Active Line/Drains/Airways     Name Placement date Placement time Site Days   Peripheral IV 12/03/22 22 G 1.75" Anterior;Right Forearm 12/03/22  0852  Forearm  1   Fistula / Graft Left Upper arm Arteriovenous vein graft 05/16/21  1535  Upper arm  567   Fistula / Graft Left 12/24/21  1000  --  345   Hemodialysis Catheter Right Subclavian --  --  Subclavian  --   Wound / Incision (Open or Dehisced) 05/16/21 (MASD) Moisture Associated Skin Damage Thigh Anterior;Proximal;Right 05/16/21  1540  Thigh  567   Wound / Incision (Open or Dehisced) 05/16/21 (MASD) Moisture Associated Skin Damage Buttocks Right;Left 05/16/21  1530  Buttocks  567            Intake/Output Last 24 hours  Intake/Output Summary (Last 24 hours) at 12/04/2022 1925 Last data filed at 12/04/2022 1200 Gross per 24 hour  Intake --   Output 0.7 ml  Net -0.7 ml    Labs/Imaging Results for orders placed or performed during the hospital encounter of 12/02/22 (from the past 48 hour(s))  CBC with Differential/Platelet     Status: Abnormal   Collection Time: 12/02/22  9:09 PM  Result Value Ref Range   WBC 7.0 4.0 - 10.5 K/uL   RBC 3.91 3.87 - 5.11 MIL/uL   Hemoglobin 11.3 (L) 12.0 - 15.0 g/dL   HCT 78.2 95.6 - 21.3 %   MCV 93.6 80.0 - 100.0 fL   MCH 28.9 26.0 - 34.0 pg   MCHC 30.9 30.0 - 36.0 g/dL   RDW 08.6 (H) 57.8 - 46.9 %   Platelets 200 150 - 400 K/uL   nRBC 0.0 0.0 - 0.2 %   Neutrophils Relative % 63 %   Neutro Abs 4.4 1.7 - 7.7 K/uL   Lymphocytes Relative 22 %   Lymphs Abs 1.6 0.7 - 4.0 K/uL   Monocytes Relative 13 %   Monocytes Absolute 0.9 0.1 -  1.0 K/uL   Eosinophils Relative 1 %   Eosinophils Absolute 0.1 0.0 - 0.5 K/uL   Basophils Relative 1 %   Basophils Absolute 0.1 0.0 - 0.1 K/uL   Immature Granulocytes 0 %   Abs Immature Granulocytes 0.02 0.00 - 0.07 K/uL    Comment: Performed at [facility], [address], [place], [place] [address]  I-stat chem 8, ED (not at [facility] or [facility])     Status: Abnormal   Collection Time: 12/02/22  9:28 PM  Result Value Ref Range   Sodium 135 135 - 145 mmol/L   Potassium 5.7 (H) 3.5 - 5.1 mmol/L   Chloride 105 98 - 111 mmol/L   BUN 66 (H) 8 - 23 mg/dL   Creatinine, Ser >69.62 (H) 0.44 - 1.00 mg/dL   Glucose, Bld 73 70 - 99 mg/dL    Comment: Glucose reference range applies only to samples taken after fasting for at least 8 hours.   Calcium, Ion 1.05 (L) 1.15 - 1.40 mmol/L   TCO2 18 (L) 22 - 32 mmol/L   Hemoglobin 12.2 12.0 - 15.0 g/dL   HCT 95.2 84.1 - 32.4 %  Hepatitis B surface antigen     Status: None   Collection Time: 12/02/22 11:17 PM  Result Value Ref Range   Hepatitis B Surface Ag NON REACTIVE NON REACTIVE    Comment: Performed at [facility], [address] [place], [place] [address]  Hepatitis B surface antibody,quantitative      Status: Abnormal   Collection Time: 12/02/22 11:17 PM  Result Value Ref Range   Hep B S AB Quant (Post) 9.8 (L) Immunity>10 mIU/mL    Comment: (NOTE)  Status of Immunity                     Anti-HBs Level  ------------------                     -------------- Inconsistent with Immunity                  0.0 - 10.0 Consistent with Immunity                         >10.0 Performed At: [facility] [address] [place], [place] [id number] Jolene Schimke MD [id number]   CBC with Differential/Platelet     Status: Abnormal   Collection Time: 12/03/22  4:30 AM  Result Value Ref Range   WBC 6.2 4.0 - 10.5 K/uL   RBC 4.35 3.87 - 5.11 MIL/uL   Hemoglobin 12.5 12.0 - 15.0 g/dL   HCT 56.4 33.2 - 95.1 %   MCV 91.5 80.0 - 100.0 fL   MCH 28.7 26.0 - 34.0 pg   MCHC 31.4 30.0 - 36.0 g/dL   RDW 88.4 (H) 16.6 - 06.3 %   Platelets 202 150 - 400 K/uL   nRBC 0.0 0.0 - 0.2 %   Neutrophils Relative % 65 %   Neutro Abs 4.1 1.7 - 7.7 K/uL   Lymphocytes Relative 19 %   Lymphs Abs 1.2 0.7 - 4.0 K/uL   Monocytes Relative 12 %   Monocytes Absolute 0.8 0.1 - 1.0 K/uL   Eosinophils Relative 2 %   Eosinophils Absolute 0.1 0.0 - 0.5 K/uL   Basophils Relative 1 %   Basophils Absolute 0.0 0.0 - 0.1 K/uL   Immature Granulocytes 1 %   Abs Immature Granulocytes  0.03 0.00 - 0.07 K/uL    Comment: Performed at [facility], [address], [place], [place] [address]  Comprehensive metabolic panel     Status: Abnormal   Collection Time: 12/03/22  4:30 AM  Result Value Ref Range   Sodium 134 (L) 135 - 145 mmol/L   Potassium 2.8 (L) 3.5 - 5.1 mmol/L   Chloride 93 (L) 98 - 111 mmol/L   CO2 25 22 - 32 mmol/L   Glucose, Bld 73 70 - 99 mg/dL    Comment: Glucose reference range applies only to samples taken after fasting for at least 8 hours.   BUN 20 8 - 23 mg/dL   Creatinine, Ser 6.04 (H) 0.44 - 1.00 mg/dL   Calcium 9.1 8.9 - 54.0 mg/dL   Total Protein 8.6 (H) 6.5 - 8.1 g/dL   Albumin 3.3 (L)  3.5 - 5.0 g/dL   AST 25 15 - 41 U/L   ALT 12 0 - 44 U/L   Alkaline Phosphatase 74 38 - 126 U/L   Total Bilirubin 0.9 0.3 - 1.2 mg/dL   GFR, Estimated 7 (L) >60 mL/min    Comment: (NOTE) Calculated using the CKD-EPI Creatinine Equation (2021)    Anion gap 16 (H) 5 - 15    Comment: Performed at [facility], [address] [place], [place] [address]  Magnesium     Status: None   Collection Time: 12/03/22  4:30 AM  Result Value Ref Range   Magnesium 1.8 1.7 - 2.4 mg/dL    Comment: Performed at [facility], [address] [place], [place] [address]  Phosphorus     Status: None   Collection Time: 12/03/22  4:30 AM  Result Value Ref Range   Phosphorus 3.4 2.5 - 4.6 mg/dL    Comment: Performed at [facility], [address] [place], [place] [address]  Basic metabolic panel     Status: Abnormal   Collection Time: 12/03/22  9:56 AM  Result Value Ref Range   Sodium 135 135 - 145 mmol/L   Potassium 4.3 3.5 - 5.1 mmol/L   Chloride 94 (L) 98 - 111 mmol/L   CO2 22 22 - 32 mmol/L   Glucose, Bld 69 (L) 70 - 99 mg/dL    Comment: Glucose reference range applies only to samples taken after fasting for at least 8 hours.   BUN 30 (H) 8 - 23 mg/dL   Creatinine, Ser 3.08 (H) 0.44 - 1.00 mg/dL   Calcium 8.9 8.9 - 65.7 mg/dL   GFR, Estimated 4 (L) >60 mL/min    Comment: (NOTE) Calculated using the CKD-EPI Creatinine Equation (2021)    Anion gap 19 (H) 5 - 15    Comment: Performed at [facility] Lab, [address], [place], [place] [address]  Hemoglobin and hematocrit, blood     Status: None   Collection Time: 12/03/22  1:00 PM  Result Value Ref Range   Hemoglobin 12.6 12.0 - 15.0 g/dL   HCT 29.5 28.4 - 13.2 %    Comment: Performed at [facility], [address], [place], [place] [address]  Protime-INR     Status: Abnormal   Collection Time: 12/03/22  1:00 PM  Result Value Ref Range   Prothrombin Time 15.5 (H) 11.4 - 15.2 seconds   INR 1.2 0.8 - 1.2     Comment: (NOTE) INR goal varies based on device and disease states. Performed at [facility], [address], [place], [place] [address]   Type and screen [facility]     Status: None   Collection Time: 12/03/22  1:45 PM  Result Value Ref Range   ABO/RH(D) B POS    Antibody Screen NEG    Sample Expiration      12/06/2022,2359 Performed at [facility], [address], [place], [place] [address]   Hemoglobin and hematocrit, blood     Status: None   Collection Time: 12/04/22  2:31 AM  Result Value Ref Range   Hemoglobin 12.1 12.0 - 15.0 g/dL   HCT 09.8 11.9 - 14.7 %    Comment: Performed at [facility], [address] [place], [place] [address]  Renal function panel     Status: Abnormal   Collection Time: 12/04/22  2:31 AM  Result Value Ref Range   Sodium 135 135 - 145 mmol/L   Potassium 4.4 3.5 - 5.1 mmol/L   Chloride 92 (L) 98 - 111 mmol/L   CO2 24 22 - 32 mmol/L   Glucose, Bld 101 (H) 70 - 99 mg/dL    Comment: Glucose reference range applies only to samples taken after fasting for at least 8 hours.   BUN 47 (H) 8 - 23 mg/dL   Creatinine, Ser 21.30 (H) 0.44 - 1.00 mg/dL   Calcium 9.0 8.9 - 86.5 mg/dL   Phosphorus 9.5 (H) 2.5 - 4.6 mg/dL   Albumin 2.9 (L) 3.5 - 5.0 g/dL   GFR, Estimated 4 (L) >60 mL/min    Comment: (NOTE) Calculated using the CKD-EPI Creatinine Equation (2021)    Anion gap 19 (H) 5 - 15    Comment: Performed at [facility], [address], [place], [place] [address]   VAS US DUPLEX DIALYSIS ACCESS (AVF, AVG)  Result Date: 12/04/2022 DIALYSIS ACCESS Patient Name:  Kathleen [name]  Date of Exam:   12/04/2022 Medical Rec #: [id number]           Accession #:    [id number] Date of Birth: 1960-09-17           Patient Gender: F Patient Age:   38 years Exam Location:  [facility] Procedure:      VAS US DUPLEX DIALYSIS ACCESS (AVF, AVG) Referring Phys: Hazeline Junker  --------------------------------------------------------------------------------  Reason for Exam: Pain in axilla. Access Site: Left Upper Extremity. Access Type: Upper arm loop AVG. History: Left upper arm pain with recently declotting of AVG. Nephrology also          notes decreased thrill today. On 12/02/22 [facility] vascular attempted declot,          but patient was in too much pain, so TDC was placed. Patient had          thrombectomy by IR 12/03/22, but suffered hemorrhage post procedure. Limitations: Severe pain Comparison Study: Prior study done 10/13/2020 Performing Technologist: Sherren Kerns RVS  Examination Guidelines: A complete evaluation includes B-mode imaging, spectral Doppler, color Doppler, and power Doppler as needed of all accessible portions of each vessel. Unilateral testing is considered an integral part of a complete examination. Limited examinations for reoccurring indications may be performed as noted.  Findings:    Summary: Arteriovenous graft-Ultrasound characteristics suggest probable thrombus noted in graft in axilla at site of severe pain. Velocities appear diminished throughout AVG.     --------------------------------------------------------------------------------   Preliminary    IR THROMBECTOMY AV FISTULA W/THROMBOLYSIS INC/SHUNT/IMG LEFT  Result Date:  12/03/2022 INDICATION: 62 year old female with a history of left upper extremity dialysis circuit thrombosis. She presents for attempt at declot EXAM: ULTRASOUND-GUIDED ACCESS LEFT UPPER EXTREMITY DIALYSIS CIRCUIT X2 FISTULAGRAM MECHANICAL AND PHARMACOLOGIC THROMBECTOMY OF LEFT UPPER EXTREMITY DIALYSIS CIRCUIT BALLOON ANGIOPLASTY WITH DRUG-ELUTING BALLOON FOR TREATMENT OF VENOUS OUTFLOW STENOSIS MEDICATIONS: 5 mg tPA 3000 units IV heparin ANESTHESIA/SEDATION: Moderate (conscious) sedation was employed during this procedure. A total of Versed 1.5 mg and Fentanyl 75 mcg was administered intravenously by the radiology nurse. Total  intra-service moderate Sedation Time: 48 minutes. The patient's level of consciousness and vital signs were monitored continuously by radiology nursing throughout the procedure under my direct supervision. FLUOROSCOPY: Radiation Exposure Index (as provided by the fluoroscopic device): 5.8 mGy Kerma COMPLICATIONS: None PROCEDURE: The procedure, risks, benefits, and alternatives were explained to the patient and the patient's family, including bleeding, infection, arterial thrombus, venous thrombus, vessel injury, need for further procedure, need for stenting, contrast reaction, need for catheter placement, cardiopulmonary collapse, death. Questions regarding the procedure were encouraged and answered. The patient understands and consents to the procedure. The left upper extremity was prepped and draped in the usual sterile fashion Ultrasound survey was performed with images stored and sent to PACs. The ultrasound demonstrates evidence of 2 adjacent dialysis circuit. One of these has clearly been excluded after a second surgery. The echogenic material within the lumen of the current circuit was used to identify the proper puncture site. This appears to represent a brachial-brachial dialysis circuit/graft. Ultrasound guidance was used to access the upper extremity graft with a micropuncture kit, directed centrally. Exchange was made for a 7 [place] short sheath. Ultrasound guidance was then used access the fistula directed toward the arterial anastomosis. Was removed and the micro wire was left in place. A combination of both a Glidewire and a Bentson wire were used to navigate a short Kumpe the catheter into the subclavian vein. Venogram performed confirming no significant central stenosis. Bentson wire was placed and initial 6 mm x 40 mm balloon angioplasty was performed in the stenotic axillary vein, which is identified as the major site of failure. Kumpe the catheter was then passed on the Bentson wire and the wire  was removed. Upon withdrawal of the angled catheter, a solution of 3 milligrams of tPA in saline was infused into the outflow portion of the graft. The Kumpe the catheter was then navigated again centri with combination of Glidewire and a Bentson wire. A 6 mm balloon catheter was used to macerate the thrombosed segment. The micro sheath was then placed on the wire directed towards the arterial anastomosis. This micropuncture set was then used to place a Bentson wire and a 6 [place] short sheath was placed. Glidewire was navigated across the arterial anastomosis, with an over the wire Fogarty balloon advanced into the artery. The arterial plug was pulled, with some difficulty withdrawing the Fogarty balloon through the arterial anastomosis. A stenosis was presumed at the arterial anastomosis. We then inflated a 4 mm x 40 mm balloon, identifying no significant stenosis. Angiogram through the balloon catheter confirmed no significant stenosis at the arterial anastomosis. At this point there has been restoration of flow through the graft. Repeat balloon angioplasty was then performed at the venous outflow, 7 mm x 40 mm balloon angioplasty at the proximal brachial vein, and the axillary vein. After standard balloon angioplasty, we treated the entire segment with a 7 mm x 60 mm drug-eluting balloon. A 3 minute time interval was observed. Repeat angiogram was  performed. Catheters and wires were removed. No residual stenosis was identified in the venous outflow. Excellent thrill was confirmed at the conclusion. The short sheaths were removed after placement of hemostasis sutures. Patient tolerated the procedure well and remained hemodynamically stable throughout. No complications were encountered and no significant blood loss was encountered. 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 The patient has old circuit which has been surgically excluded. The updated circuit has a typical flow pattern, which is counter  clockwise from the brachial artery in the axillary region, towards the shoulder. 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 No residual stenosis on completion. Excellent thrill on completion. 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 Signed, Yvone Neu. Miachel Roux, RPVI [facility] Radiology ACCESS: This access remains amenable to future percutaneous interventions as clinically indicated. Electronically Signed   By: Gilmer Mor D.O.   On: 12/03/2022 12:21   IR US Guide Vasc Access Left  Result Date: 12/03/2022 INDICATION: 62 year old female with a history of left upper extremity dialysis circuit thrombosis. She presents for attempt at declot EXAM: ULTRASOUND-GUIDED ACCESS LEFT UPPER EXTREMITY DIALYSIS CIRCUIT X2 FISTULAGRAM MECHANICAL AND PHARMACOLOGIC THROMBECTOMY OF LEFT UPPER EXTREMITY DIALYSIS CIRCUIT BALLOON ANGIOPLASTY WITH DRUG-ELUTING BALLOON FOR TREATMENT OF VENOUS OUTFLOW STENOSIS MEDICATIONS: 5 mg tPA 3000 units IV heparin ANESTHESIA/SEDATION: Moderate (conscious) sedation was employed during this procedure. A total of Versed 1.5 mg and Fentanyl 75 mcg was administered intravenously by the radiology nurse. Total intra-service moderate Sedation Time: 48 minutes. The patient's level of consciousness and vital signs were monitored continuously by radiology nursing throughout the procedure under my direct supervision. FLUOROSCOPY: Radiation Exposure Index (as provided by the fluoroscopic device): 5.8 mGy Kerma COMPLICATIONS: None  PROCEDURE: The procedure, risks, benefits, and alternatives were explained to the patient and the patient's family, including bleeding, infection, arterial thrombus, venous thrombus, vessel injury, need for further procedure, need for stenting, contrast reaction, need for catheter placement, cardiopulmonary collapse, death. Questions regarding the procedure were encouraged and answered. The patient understands and consents to the procedure. The left upper extremity was prepped and draped in the usual sterile fashion Ultrasound survey was performed with images stored and sent to PACs. The ultrasound demonstrates evidence of 2 adjacent dialysis circuit. One of these has clearly been excluded after a second surgery. The echogenic material within the lumen of the current circuit was used to identify the proper puncture site. This appears to represent a brachial-brachial dialysis circuit/graft. Ultrasound guidance was used to access the upper extremity graft with a micropuncture kit, directed centrally. Exchange was made for a 7 [place] short sheath. Ultrasound guidance was then used access the fistula directed toward the arterial anastomosis. Was removed and the micro wire was left in place. A combination of both a Glidewire and a Bentson wire were used to navigate a short Kumpe the catheter into the subclavian vein. Venogram performed confirming no significant central stenosis. Bentson wire was placed and initial 6 mm x 40 mm balloon angioplasty was performed in the stenotic axillary vein, which is identified as the major site of failure. Kumpe the catheter was then passed on the Bentson wire and  the wire was removed. Upon withdrawal of the angled catheter, a solution of 3 milligrams of tPA in saline was infused into the outflow portion of the graft. The Kumpe the catheter was then navigated again centri with combination of Glidewire and a Bentson wire. A 6 mm balloon catheter was used to macerate the thrombosed  segment. The micro sheath was then placed on the wire directed towards the arterial anastomosis. This micropuncture set was then used to place a Bentson wire and a 6 [place] short sheath was placed. Glidewire was navigated across the arterial anastomosis, with an over the wire Fogarty balloon advanced into the artery. The arterial plug was pulled, with some difficulty withdrawing the Fogarty balloon through the arterial anastomosis. A stenosis was presumed at the arterial anastomosis. We then inflated a 4 mm x 40 mm balloon, identifying no significant stenosis. Angiogram through the balloon catheter confirmed no significant stenosis at the arterial anastomosis. At this point there has been restoration of flow through the graft. Repeat balloon angioplasty was then performed at the venous outflow, 7 mm x 40 mm balloon angioplasty at the proximal brachial vein, and the axillary vein. After standard balloon angioplasty, we treated the entire segment with a 7 mm x 60 mm drug-eluting balloon. A 3 minute time interval was observed. Repeat angiogram was performed. Catheters and wires were removed. No residual stenosis was identified in the venous outflow. Excellent thrill was confirmed at the conclusion. The short sheaths were removed after placement of hemostasis sutures. Patient tolerated the procedure well and remained hemodynamically stable throughout. No complications were encountered and no significant blood loss was encountered. 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 The patient has old circuit which has been surgically excluded. The updated circuit has a typical flow pattern, which is counter clockwise from the brachial artery in the axillary region, towards the shoulder. 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 No residual stenosis on completion. Excellent thrill on  completion. 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 Signed, Yvone Neu. Miachel Roux, RPVI [facility] Radiology ACCESS: This access remains amenable to future percutaneous interventions as clinically indicated. Electronically Signed   By: Gilmer Mor D.O.   On: 12/03/2022 12:21    Pending Labs Unresulted Labs (From admission, onward)     Start     Ordered   12/04/22 0843  Culture, blood (Routine X 2) w Reflex to ID Panel  BLOOD CULTURE X 2,   R (with TIMED occurrences)     Question:  Patient immune status  Answer:  Immunocompromised   12/04/22 0842            Vitals/Pain Today's Vitals   12/04/22 1315 12/04/22 1700 12/04/22 1733 12/04/22 1920  BP:    100/72  Pulse:    93  Resp:    16  Temp: 97.7 F (36.5 C)  (!) 97.1 F (36.2 C) 98 F (36.7 C)  TempSrc: Oral   Oral  SpO2:  99%  100%  Weight:      PainSc:        Isolation Precautions No active isolations  Medications Medications  acetaminophen (TYLENOL)  tablet 650 mg (has no administration in time range)    Or  acetaminophen (TYLENOL) suppository 650 mg (has no administration in time range)  hydrALAZINE (APRESOLINE) injection 10 mg (has no administration in time range)  naloxone [facility]) injection 0.4 mg (has no administration in time range)  fentaNYL (SUBLIMAZE) injection 25 mcg (25 mcg Intravenous Given 12/04/22 1919)  cinacalcet (SENSIPAR) tablet 60 mg (has no administration in time range)  doxercalciferol (HECTOROL) injection 3 mcg (3 mcg Intravenous Given 12/04/22 1108)  Chlorhexidine Gluconate Cloth 2 % PADS 6 each (6 each Topical Not Given 12/03/22 1926)  sevelamer carbonate  (RENVELA) tablet 2,400 mg (2,400 mg Oral Given 12/04/22 1427)  lidocaine-EPINEPHrine (XYLOCAINE W/EPI) 1 %-1:100000 (with pres) injection 20 mL (20 mLs Intradermal Not Given 12/03/22 1926)  HYDROmorphone (DILAUDID) injection 0.5 mg (has no administration in time range)  heparin sodium (porcine) 1000 UNIT/ML injection (has no administration in time range)  oxyCODONE-acetaminophen (PERCOCET/ROXICET) 5-325 MG per tablet 1 tablet (1 tablet Oral Given 12/02/22 2110)  sodium zirconium cyclosilicate (LOKELMA) packet 10 g (10 g Oral Given 12/02/22 2309)  heparin injection 2,000 Units (2,000 Units Dialysis Given 12/04/22 0805)  midazolam (VERSED) injection (0.5 mg Intravenous Given 12/03/22 1115)  iohexol (OMNIPAQUE) 300 MG/ML solution 150 mL (20 mLs Intravenous Contrast Given 12/03/22 1129)  heparin sodium (porcine) injection (3,000 Units Intravenous Given 12/03/22 1050)  fentaNYL (SUBLIMAZE) injection (25 mcg Intravenous Given 12/03/22 1122)  ondansetron (ZOFRAN) injection (4 mg Intravenous Given 12/03/22 1127)  oxyCODONE-acetaminophen (PERCOCET/ROXICET) 5-325 MG per tablet 1-2 tablet (1 tablet Oral Given 12/04/22 1610)    Mobility walks     Focused Assessments    R Recommendations: See Admitting Provider Note  Report given to:   Additional Notes:

## 2022-12-04 NOTE — Progress Notes (Signed)
   12/04/22 1200  Vitals  Temp (!) 97.5 F (36.4 C)  Pulse Rate 85  Resp 16  BP 104/80  SpO2 97 %  O2 Device Room Air  Weight  (unable to weigh---stretcher)  Type of Weight Post-Dialysis  Oxygen Therapy  Patient Activity (if Appropriate) In bed  Pulse Oximetry Type Continuous  Oximetry Probe Site Changed No  Post Treatment  Dialyzer Clearance Lightly streaked  Duration of HD Treatment -hour(s) 3.15 hour(s)  Hemodialysis Intake (mL) 300 mL  Liters Processed 57.9  Fluid Removed (mL) 0.7 mL  Tolerated HD Treatment Yes     12/04/22 1200  Vitals  Temp (!) 97.5 F (36.4 C)  Pulse Rate 85  Resp 16  BP 104/80  SpO2 97 %  O2 Device Room Air  Weight  (unable to weigh---stretcher)  Type of Weight Post-Dialysis  Oxygen Therapy  Patient Activity (if Appropriate) In bed  Pulse Oximetry Type Continuous  Oximetry Probe Site Changed No  Post Treatment  Dialyzer Clearance Lightly streaked  Duration of HD Treatment -hour(s) 3.15 hour(s)  Hemodialysis Intake (mL) 300 mL  Liters Processed 57.9  Fluid Removed (mL) 0.7 mL  Tolerated HD Treatment Yes   Received patient in bed to unit.  Alert and oriented.  Informed consent signed and in chart.   TX duration:3.15  Patient tolerated well.  Transported back to the room  Alert, without acute distress.  Hand-off given to patient's nurse.   Access used: [facility] Access issues: very positional to both lines  Total UF removed: 700cc Medication(s) given: hectoral ixv x1   [facility]

## 2022-12-04 NOTE — ED Notes (Signed)
ED TO INPATIENT HANDOFF REPORT  ED Nurse Name and Phone #: chris [phone]  S Name/Age/Gender Kathleen [name] 62 y.o. female Room/Bed: H011C/H011C  Code Status   Code Status: Full Code  Home/SNF/Other Home Patient oriented to: self, place, time, and situation Is this baseline? Yes   Triage Complete: Triage complete  Chief Complaint Hyperkalemia [E87.5] ESRD (end stage renal disease) (HCC) [N18.6] Complication of arteriovenous dialysis fistula [T82.9XXA]  Triage Note Pt fistula in left upper arm clogged and was unable to be used on Tuesday. Pt went to vein and vascular today and was told it should not be so painful and to come here to have it evaluated. Pt states pain hurts up to armpit a.   Pt had new access placed in right upper chest and needs dialysis. Last dialysis was Saturday.    Allergies No Known Allergies  Level of Care/Admitting Diagnosis ED Disposition     ED Disposition  Admit   Condition  --   Comment  Hospital Area: [facility] [[id number]]  Level of Care: Progressive [102]  Admit to Progressive based on following criteria: MULTISYSTEM THREATS such as stable sepsis, metabolic/electrolyte imbalance with or without encephalopathy that is responding to early treatment.  May place patient in observation at [facility] or [facility] if equivalent level of care is available:: No  Covid Evaluation: Asymptomatic - no recent exposure (last 10 days) testing not required  Diagnosis: Complication of arteriovenous dialysis fistula [960454]  Admitting Physician: Angie Fava [[id number]]  Attending Physician: Angie Fava [[id number]]          B Medical/Surgery History Past Medical History:  Diagnosis Date   Anemia    Arthritis    Cardiac arrest (HCC)    Cellulitis    Cerebral vasculitis    COPD (chronic obstructive pulmonary disease) (HCC)    Elevated antinuclear antibody (ANA) level    Endometriosis    ESRD (end stage renal disease)  on dialysis (HCC)    Gallstones    Hypertension    Lupus (HCC)    Migraine    PONV (postoperative nausea and vomiting)    Seizures (HCC)    Sepsis (HCC)    Sleep apnea    Suicide attempt (HCC)    TB (tuberculosis)    Transient ischemic attack (TIA)    Vasculitis of skin    Past Surgical History:  Procedure Laterality Date   AV FISTULA PLACEMENT Left 07/04/2017   Procedure: ARTERIOVENOUS (AV) FISTULA CREATION;  Surgeon: Fransisco Hertz, MD;  Location[facility] OR;  Service: Vascular;  Laterality: Left;   AV FISTULA PLACEMENT Left 11/27/2019   Procedure: INSERTION OF ARTERIOVENOUS (AV) GORE-TEX GRAFT LEFT UPPER ARM;  Surgeon: Chuck Hint, MD;  Location: [facility] OR;  Service: Vascular;  Laterality: Left;   AV FISTULA PLACEMENT Left 07/29/2020   Procedure: INSERTION OF LEFT UPPER ARM ARTERIOVENOUS (AV) GORE-TEX GRAFT;  Surgeon: Chuck Hint, MD;  Location: [facility] OR;  Service: Vascular;  Laterality: Left;   BIOPSY  12/23/2021   Procedure: BIOPSY;  Surgeon: Tressia Danas, MD;  Location: [facility] ENDOSCOPY;  Service: Gastroenterology;;   BUBBLE STUDY  05/25/2021   Procedure: BUBBLE STUDY;  Surgeon: Sande Rives, MD;  Location: [facility] ENDOSCOPY;  Service: Cardiovascular;;   CESAREAN SECTION     COLONOSCOPY WITH PROPOFOL N/A 12/23/2021   Procedure: COLONOSCOPY WITH PROPOFOL;  Surgeon: Tressia Danas, MD;  Location: [facility] ENDOSCOPY;  Service: Gastroenterology;  Laterality: N/A;   ESOPHAGOGASTRODUODENOSCOPY N/A 07/15/2016  Procedure: ESOPHAGOGASTRODUODENOSCOPY (EGD);  Surgeon: Carman Ching, MD;  Location: [facility] ENDOSCOPY;  Service: Endoscopy;  Laterality: N/A;   ESOPHAGOGASTRODUODENOSCOPY N/A 12/23/2021   Procedure: ESOPHAGOGASTRODUODENOSCOPY (EGD);  Surgeon: Tressia Danas, MD;  Location: [facility] ENDOSCOPY;  Service: Gastroenterology;  Laterality: N/A;   I & D EXTREMITY Left 05/27/2021   Procedure: IRRIGATION AND DEBRIDEMENT OF WRIST;  Surgeon: Bradly Bienenstock, MD;  Location: [facility] OR;  Service:  Orthopedics;  Laterality: Left;   INSERTION OF DIALYSIS CATHETER Right 07/04/2017   Procedure: INSERTION OF TUNNELED  DIALYSIS CATHETER - RIGHT INTERNAL JUGULAR PLACEMENT;  Surgeon: Fransisco Hertz, MD;  Location: [facility] OR;  Service: Vascular;  Laterality: Right;   IR FLUORO GUIDE CV LINE RIGHT  09/08/2021   IR THROMBECTOMY AV FISTULA W/THROMBOLYSIS INC/SHUNT/IMG LEFT Left 12/03/2022   IR THROMBECTOMY AV FISTULA W/THROMBOLYSIS/PTA INC/SHUNT/IMG LEFT Left 09/10/2021   IR US GUIDE VASC ACCESS LEFT  09/10/2021   IR US GUIDE VASC ACCESS LEFT  12/03/2022   IR US GUIDE VASC ACCESS RIGHT  09/08/2021   LUNG BIOPSY     POLYPECTOMY  12/23/2021   Procedure: POLYPECTOMY;  Surgeon: Tressia Danas, MD;  Location: [facility] ENDOSCOPY;  Service: Gastroenterology;;   TEE WITHOUT CARDIOVERSION N/A 05/25/2021   Procedure: TRANSESOPHAGEAL ECHOCARDIOGRAM (TEE);  Surgeon: Sande Rives, MD;  Location: [facility] ENDOSCOPY;  Service: Cardiovascular;  Laterality: N/A;   TUBAL LIGATION       A IV Location/Drains/Wounds Patient Lines/Drains/Airways Status     Active Line/Drains/Airways     Name Placement date Placement time Site Days   Peripheral IV 12/03/22 22 G 1.75" Anterior;Right Forearm 12/03/22  0852  Forearm  1   Fistula / Graft Left Upper arm Arteriovenous vein graft 05/16/21  1535  Upper arm  567   Fistula / Graft Left 12/24/21  1000  --  345   Hemodialysis Catheter Right Subclavian --  --  Subclavian  --   Wound / Incision (Open or Dehisced) 05/16/21 (MASD) Moisture Associated Skin Damage Thigh Anterior;Proximal;Right 05/16/21  1540  Thigh  567   Wound / Incision (Open or Dehisced) 05/16/21 (MASD) Moisture Associated Skin Damage Buttocks Right;Left 05/16/21  1530  Buttocks  567            Intake/Output Last 24 hours  Intake/Output Summary (Last 24 hours) at 12/04/2022 1529 Last data filed at 12/04/2022 1200 Gross per 24 hour  Intake --  Output 0.7 ml  Net -0.7 ml    Labs/Imaging Results for orders placed or  performed during the hospital encounter of 12/02/22 (from the past 48 hour(s))  CBC with Differential/Platelet     Status: Abnormal   Collection Time: 12/02/22  9:09 PM  Result Value Ref Range   WBC 7.0 4.0 - 10.5 K/uL   RBC 3.91 3.87 - 5.11 MIL/uL   Hemoglobin 11.3 (L) 12.0 - 15.0 g/dL   HCT 09.8 11.9 - 14.7 %   MCV 93.6 80.0 - 100.0 fL   MCH 28.9 26.0 - 34.0 pg   MCHC 30.9 30.0 - 36.0 g/dL   RDW 82.9 (H) 56.2 - 13.0 %   Platelets 200 150 - 400 K/uL   nRBC 0.0 0.0 - 0.2 %   Neutrophils Relative % 63 %   Neutro Abs 4.4 1.7 - 7.7 K/uL   Lymphocytes Relative 22 %   Lymphs Abs 1.6 0.7 - 4.0 K/uL   Monocytes Relative 13 %   Monocytes Absolute 0.9 0.1 - 1.0 K/uL   Eosinophils Relative 1 %   Eosinophils Absolute  0.1 0.0 - 0.5 K/uL   Basophils Relative 1 %   Basophils Absolute 0.1 0.0 - 0.1 K/uL   Immature Granulocytes 0 %   Abs Immature Granulocytes 0.02 0.00 - 0.07 K/uL    Comment: Performed at [facility] Lab, [address], [place], [place] [address]  I-stat chem 8, ED (not at [facility], [facility] or [facility])     Status: Abnormal   Collection Time: 12/02/22  9:28 PM  Result Value Ref Range   Sodium 135 135 - 145 mmol/L   Potassium 5.7 (H) 3.5 - 5.1 mmol/L   Chloride 105 98 - 111 mmol/L   BUN 66 (H) 8 - 23 mg/dL   Creatinine, Ser >96.29 (H) 0.44 - 1.00 mg/dL   Glucose, Bld 73 70 - 99 mg/dL    Comment: Glucose reference range applies only to samples taken after fasting for at least 8 hours.   Calcium, Ion 1.05 (L) 1.15 - 1.40 mmol/L   TCO2 18 (L) 22 - 32 mmol/L   Hemoglobin 12.2 12.0 - 15.0 g/dL   HCT 52.8 41.3 - 24.4 %  Hepatitis B surface antigen     Status: None   Collection Time: 12/02/22 11:17 PM  Result Value Ref Range   Hepatitis B Surface Ag NON REACTIVE NON REACTIVE    Comment: Performed at [facility] Lab, [address], [place], [place] [address]  Hepatitis B surface antibody,quantitative     Status: Abnormal   Collection Time: 12/02/22 11:17 PM  Result Value Ref Range    Hep B S AB Quant (Post) 9.8 (L) Immunity>10 mIU/mL    Comment: (NOTE)  Status of Immunity                     Anti-HBs Level  ------------------                     -------------- Inconsistent with Immunity                  0.0 - 10.0 Consistent with Immunity                         >10.0 Performed At: [facility] [address] [place], [place] [id number] Jolene Schimke MD KV:[id number]   CBC with Differential/Platelet     Status: Abnormal   Collection Time: 12/03/22  4:30 AM  Result Value Ref Range   WBC 6.2 4.0 - 10.5 K/uL   RBC 4.35 3.87 - 5.11 MIL/uL   Hemoglobin 12.5 12.0 - 15.0 g/dL   HCT 64.3 32.9 - 51.8 %   MCV 91.5 80.0 - 100.0 fL   MCH 28.7 26.0 - 34.0 pg   MCHC 31.4 30.0 - 36.0 g/dL   RDW 84.1 (H) 66.0 - 63.0 %   Platelets 202 150 - 400 K/uL   nRBC 0.0 0.0 - 0.2 %   Neutrophils Relative % 65 %   Neutro Abs 4.1 1.7 - 7.7 K/uL   Lymphocytes Relative 19 %   Lymphs Abs 1.2 0.7 - 4.0 K/uL   Monocytes Relative 12 %   Monocytes Absolute 0.8 0.1 - 1.0 K/uL   Eosinophils Relative 2 %   Eosinophils Absolute 0.1 0.0 - 0.5 K/uL   Basophils Relative 1 %   Basophils Absolute 0.0 0.0 - 0.1 K/uL   Immature Granulocytes 1 %   Abs Immature Granulocytes 0.03 0.00 - 0.07 K/uL    Comment: Performed at [facility], [address], [place], [place] [address]  Comprehensive metabolic panel     Status: Abnormal   Collection Time: 12/03/22  4:30 AM  Result Value Ref Range   Sodium 134 (L) 135 - 145 mmol/L   Potassium 2.8 (L) 3.5 - 5.1 mmol/L   Chloride 93 (L) 98 - 111 mmol/L   CO2 25 22 - 32 mmol/L   Glucose, Bld 73 70 - 99 mg/dL    Comment: Glucose reference range applies only to samples taken after fasting for at least 8 hours.   BUN 20 8 - 23 mg/dL   Creatinine, Ser 6.21 (H) 0.44 - 1.00 mg/dL   Calcium 9.1 8.9 - 30.8 mg/dL   Total Protein 8.6 (H) 6.5 - 8.1 g/dL   Albumin 3.3 (L) 3.5 - 5.0 g/dL   AST 25 15 - 41 U/L   ALT 12 0 - 44 U/L   Alkaline Phosphatase  74 38 - 126 U/L   Total Bilirubin 0.9 0.3 - 1.2 mg/dL   GFR, Estimated 7 (L) >60 mL/min    Comment: (NOTE) Calculated using the CKD-EPI Creatinine Equation (2021)    Anion gap 16 (H) 5 - 15    Comment: Performed at [facility] Lab, [address], [place], [place] [address]  Magnesium     Status: None   Collection Time: 12/03/22  4:30 AM  Result Value Ref Range   Magnesium 1.8 1.7 - 2.4 mg/dL    Comment: Performed at [facility], [address] [place], [place] [address]  Phosphorus     Status: None   Collection Time: 12/03/22  4:30 AM  Result Value Ref Range   Phosphorus 3.4 2.5 - 4.6 mg/dL    Comment: Performed at [facility] Lab, [address] [place], [place] [address]  Basic metabolic panel     Status: Abnormal   Collection Time: 12/03/22  9:56 AM  Result Value Ref Range   Sodium 135 135 - 145 mmol/L   Potassium 4.3 3.5 - 5.1 mmol/L   Chloride 94 (L) 98 - 111 mmol/L   CO2 22 22 - 32 mmol/L   Glucose, Bld 69 (L) 70 - 99 mg/dL    Comment: Glucose reference range applies only to samples taken after fasting for at least 8 hours.   BUN 30 (H) 8 - 23 mg/dL   Creatinine, Ser 3.24 (H) 0.44 - 1.00 mg/dL   Calcium 8.9 8.9 - 40.1 mg/dL   GFR, Estimated 4 (L) >60 mL/min    Comment: (NOTE) Calculated using the CKD-EPI Creatinine Equation (2021)    Anion gap 19 (H) 5 - 15    Comment: Performed at [facility] Lab, [address], [place], [place] [address]  Hemoglobin and hematocrit, blood     Status: None   Collection Time: 12/03/22  1:00 PM  Result Value Ref Range   Hemoglobin 12.6 12.0 - 15.0 g/dL   HCT 36.6 44.0 - 34.7 %    Comment: Performed at [facility] Lab, [address] [place], [place] [address]  Protime-INR     Status: Abnormal   Collection Time: 12/03/22  1:00 PM  Result Value Ref Range   Prothrombin Time 15.5 (H) 11.4 - 15.2 seconds   INR 1.2 0.8 - 1.2    Comment: (NOTE) INR goal varies based on device and disease states. Performed at [facility] Lab, [address], [place], [place] [address]   Type  and screen [facility]     Status: None   Collection Time: 12/03/22  1:45 PM  Result Value Ref Range   ABO/RH(D) B POS    Antibody Screen NEG    Sample Expiration      12/06/2022,2359 Performed at [facility] Lab, [address], [place], [place] [address]   Hemoglobin and hematocrit, blood     Status: None   Collection Time: 12/04/22  2:31 AM  Result Value Ref Range   Hemoglobin 12.1 12.0 - 15.0 g/dL   HCT 29.5 62.1 - 30.8 %    Comment: Performed at [facility] Lab, [address] [place], [place] [address]  Renal function panel     Status: Abnormal   Collection Time: 12/04/22  2:31 AM  Result Value Ref Range   Sodium 135 135 - 145 mmol/L   Potassium 4.4 3.5 - 5.1 mmol/L   Chloride 92 (L) 98 - 111 mmol/L   CO2 24 22 - 32 mmol/L   Glucose, Bld 101 (H) 70 - 99 mg/dL    Comment: Glucose reference range applies only to samples taken after fasting for at least 8 hours.   BUN 47 (H) 8 - 23 mg/dL   Creatinine, Ser 69.62 (H) 0.44 - 1.00 mg/dL   Calcium 9.0 8.9 - 95.2 mg/dL   Phosphorus 9.5 (H) 2.5 - 4.6 mg/dL   Albumin 2.9 (L) 3.5 - 5.0 g/dL   GFR, Estimated 4 (L) >60 mL/min    Comment: (NOTE) Calculated using the CKD-EPI Creatinine Equation (2021)    Anion gap 19 (H) 5 - 15    Comment: Performed at [facility] Lab, [address], [place], [place] [address]   VAS US DUPLEX DIALYSIS ACCESS (AVF, AVG)  Result Date: 12/04/2022 DIALYSIS ACCESS Patient Name:  Kathleen [name]  Date of Exam:   12/04/2022 Medical Rec #: [id number]           Accession #:    [id number] Date of Birth: 10-Apr-1961           Patient Gender: F Patient Age:   53 years Exam Location:  [facility] Procedure:      VAS US DUPLEX DIALYSIS ACCESS (AVF, AVG) Referring Phys: Hazeline Junker --------------------------------------------------------------------------------  Reason for Exam: Pain in axilla. Access Site: Left Upper  Extremity. Access Type: Upper arm loop AVG. History: Left upper arm pain with recently declotting of AVG. Nephrology also          notes decreased thrill today. On 12/02/22 [facility] vascular attempted declot,          but patient was in too much pain, so TDC was placed. Patient had          thrombectomy by IR 12/03/22, but suffered hemorrhage post procedure. Limitations: Severe pain Comparison Study: Prior study done 10/13/2020 Performing Technologist: Sherren Kerns RVS  Examination Guidelines: A complete evaluation includes B-mode imaging, spectral Doppler, color Doppler, and power Doppler as needed of all accessible portions of each vessel. Unilateral testing is considered an integral part of a complete examination. Limited examinations for reoccurring indications may be performed as noted.  Findings:    Summary: Arteriovenous graft-Probable thrombus noted noted in graft in axilla at site of severe pain. Velocities appear diminished throughout AVG.     --------------------------------------------------------------------------------   Preliminary    IR THROMBECTOMY AV FISTULA W/THROMBOLYSIS INC/SHUNT/IMG LEFT  Result Date: 12/03/2022 INDICATION: 62 year old female with a history of left upper extremity dialysis circuit thrombosis.  She presents for attempt at declot EXAM: ULTRASOUND-GUIDED ACCESS LEFT UPPER EXTREMITY DIALYSIS CIRCUIT X2 FISTULAGRAM MECHANICAL AND PHARMACOLOGIC THROMBECTOMY OF LEFT UPPER EXTREMITY DIALYSIS CIRCUIT BALLOON ANGIOPLASTY WITH DRUG-ELUTING BALLOON FOR TREATMENT OF VENOUS OUTFLOW STENOSIS MEDICATIONS: 5 mg tPA 3000 units IV heparin ANESTHESIA/SEDATION: Moderate (conscious) sedation was employed during this procedure. A total of Versed 1.5 mg and Fentanyl 75 mcg was administered intravenously by the radiology nurse. Total intra-service moderate Sedation Time: 48 minutes. The patient's level of consciousness and vital signs were monitored continuously by radiology nursing throughout the procedure  under my direct supervision. FLUOROSCOPY: Radiation Exposure Index (as provided by the fluoroscopic device): 5.8 mGy Kerma COMPLICATIONS: None PROCEDURE: The procedure, risks, benefits, and alternatives were explained to the patient and the patient's family, including bleeding, infection, arterial thrombus, venous thrombus, vessel injury, need for further procedure, need for stenting, contrast reaction, need for catheter placement, cardiopulmonary collapse, death. Questions regarding the procedure were encouraged and answered. The patient understands and consents to the procedure. The left upper extremity was prepped and draped in the usual sterile fashion Ultrasound survey was performed with images stored and sent to PACs. The ultrasound demonstrates evidence of 2 adjacent dialysis circuit. One of these has clearly been excluded after a second surgery. The echogenic material within the lumen of the current circuit was used to identify the proper puncture site. This appears to represent a brachial-brachial dialysis circuit/graft. Ultrasound guidance was used to access the upper extremity graft with a micropuncture kit, directed centrally. Exchange was made for a 7 [place] short sheath. Ultrasound guidance was then used access the fistula directed toward the arterial anastomosis. Was removed and the micro wire was left in place. A combination of both a Glidewire and a Bentson wire were used to navigate a short Kumpe the catheter into the subclavian vein. Venogram performed confirming no significant central stenosis. Bentson wire was placed and initial 6 mm x 40 mm balloon angioplasty was performed in the stenotic axillary vein, which is identified as the major site of failure. Kumpe the catheter was then passed on the Bentson wire and the wire was removed. Upon withdrawal of the angled catheter, a solution of 3 milligrams of tPA in saline was infused into the outflow portion of the graft. The Kumpe the catheter was  then navigated again centri with combination of Glidewire and a Bentson wire. A 6 mm balloon catheter was used to macerate the thrombosed segment. The micro sheath was then placed on the wire directed towards the arterial anastomosis. This micropuncture set was then used to place a Bentson wire and a 6 [place] short sheath was placed. Glidewire was navigated across the arterial anastomosis, with an over the wire Fogarty balloon advanced into the artery. The arterial plug was pulled, with some difficulty withdrawing the Fogarty balloon through the arterial anastomosis. A stenosis was presumed at the arterial anastomosis. We then inflated a 4 mm x 40 mm balloon, identifying no significant stenosis. Angiogram through the balloon catheter confirmed no significant stenosis at the arterial anastomosis. At this point there has been restoration of flow through the graft. Repeat balloon angioplasty was then performed at the venous outflow, 7 mm x 40 mm balloon angioplasty at the proximal brachial vein, and the axillary vein. After standard balloon angioplasty, we treated the entire segment with a 7 mm x 60 mm drug-eluting balloon. A 3 minute time interval was observed. Repeat angiogram was performed. Catheters and wires were removed. No residual stenosis was identified in the venous  outflow. Excellent thrill was confirmed at the conclusion. The short sheaths were removed after placement of hemostasis sutures. Patient tolerated the procedure well and remained hemodynamically stable throughout. No complications were encountered and no significant blood loss was encountered. 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 The patient has old circuit which has been surgically excluded. The updated circuit has a typical flow pattern, which is counter clockwise from the brachial artery in the axillary region, towards the shoulder. The mode of failure was a critical stenosis in the axillary vein/brachial vein outflow, which  were treated with 7 mm balloon angioplasty and a drug-eluting balloon 7 mm. No residual stenosis on completion. Excellent thrill on completion. 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 Signed, Yvone Neu. Miachel Roux, RPVI [facility] Radiology ACCESS: This access remains amenable to future percutaneous interventions as clinically indicated. Electronically Signed   By: Gilmer Mor D.O.   On: 12/03/2022 12:21   IR US Guide Vasc Access Left  Result Date: 12/03/2022 INDICATION: 62 year old female with a history of left upper extremity dialysis circuit thrombosis. She presents for attempt at declot EXAM: ULTRASOUND-GUIDED ACCESS LEFT UPPER EXTREMITY DIALYSIS CIRCUIT X2 FISTULAGRAM MECHANICAL AND PHARMACOLOGIC THROMBECTOMY OF LEFT UPPER EXTREMITY DIALYSIS CIRCUIT BALLOON ANGIOPLASTY WITH DRUG-ELUTING BALLOON FOR TREATMENT OF VENOUS OUTFLOW STENOSIS MEDICATIONS: 5 mg tPA 3000 units IV heparin ANESTHESIA/SEDATION: Moderate (conscious) sedation was employed during this procedure. A total of Versed 1.5 mg and Fentanyl 75 mcg was administered intravenously by the radiology nurse. Total intra-service moderate Sedation Time: 48 minutes. The patient's level of consciousness and vital signs were monitored continuously by radiology nursing throughout the procedure under my direct supervision. FLUOROSCOPY: Radiation Exposure Index (as provided by the fluoroscopic device): 5.8 mGy Kerma COMPLICATIONS: None PROCEDURE: The procedure, risks, benefits, and alternatives were explained to the patient and the patient's family, including bleeding, infection, arterial thrombus, venous thrombus,  vessel injury, need for further procedure, need for stenting, contrast reaction, need for catheter placement, cardiopulmonary collapse, death. Questions regarding the procedure were encouraged and answered. The patient understands and consents to the procedure. The left upper extremity was prepped and draped in the usual sterile fashion Ultrasound survey was performed with images stored and sent to PACs. The ultrasound demonstrates evidence of 2 adjacent dialysis circuit. One of these has clearly been excluded after a second surgery. The echogenic material within the lumen of the current circuit was used to identify the proper puncture site. This appears to represent a brachial-brachial dialysis circuit/graft. Ultrasound guidance was used to access the upper extremity graft with a micropuncture kit, directed centrally. Exchange was made for a 7 [place] short sheath. Ultrasound guidance was then used access the fistula directed toward the arterial anastomosis. Was removed and the micro wire was left in place. A combination of both a Glidewire and a Bentson wire were used to navigate a short Kumpe the catheter into the subclavian vein. Venogram performed confirming no significant central stenosis. Bentson wire was placed and initial 6 mm x 40 mm balloon angioplasty was performed in the stenotic axillary vein, which is identified as the major site of failure. Kumpe the catheter was then passed on the Bentson wire and the wire was removed. Upon withdrawal of the angled catheter, a solution of 3  milligrams of tPA in saline was infused into the outflow portion of the graft. The Kumpe the catheter was then navigated again centri with combination of Glidewire and a Bentson wire. A 6 mm balloon catheter was used to macerate the thrombosed segment. The micro sheath was then placed on the wire directed towards the arterial anastomosis. This micropuncture set was then used to place a Bentson wire and a 6 [place] short sheath was  placed. Glidewire was navigated across the arterial anastomosis, with an over the wire Fogarty balloon advanced into the artery. The arterial plug was pulled, with some difficulty withdrawing the Fogarty balloon through the arterial anastomosis. A stenosis was presumed at the arterial anastomosis. We then inflated a 4 mm x 40 mm balloon, identifying no significant stenosis. Angiogram through the balloon catheter confirmed no significant stenosis at the arterial anastomosis. At this point there has been restoration of flow through the graft. Repeat balloon angioplasty was then performed at the venous outflow, 7 mm x 40 mm balloon angioplasty at the proximal brachial vein, and the axillary vein. After standard balloon angioplasty, we treated the entire segment with a 7 mm x 60 mm drug-eluting balloon. A 3 minute time interval was observed. Repeat angiogram was performed. Catheters and wires were removed. No residual stenosis was identified in the venous outflow. Excellent thrill was confirmed at the conclusion. The short sheaths were removed after placement of hemostasis sutures. Patient tolerated the procedure well and remained hemodynamically stable throughout. No complications were encountered and no significant blood loss was encountered. 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 The patient has old circuit which has been surgically excluded. The updated circuit has a typical flow pattern, which is counter clockwise from the brachial artery in the axillary region, towards the shoulder. 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 No residual stenosis on completion. Excellent thrill on completion. IMPRESSION: Status post ultrasound guided access of left upper extremity brachial-brachial dialysis graft, for pharmacologic and mechanical thrombectomy to restore flow after previous  thrombosis. 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 Signed, Yvone Neu. Miachel Roux, RPVI [facility] ACCESS: This access remains amenable to future percutaneous interventions as clinically indicated. Electronically Signed   By: Gilmer Mor D.O.   On: 12/03/2022 12:21    Pending Labs Unresulted Labs (From admission, onward)     Start     Ordered   12/04/22 0843  Culture, blood (Routine X 2) w Reflex to ID Panel  BLOOD CULTURE X 2,   R (with TIMED occurrences)     Question:  Patient immune status  Answer:  Immunocompromised   12/04/22 0842            Vitals/Pain Today's Vitals   12/04/22 1200 12/04/22 1233 12/04/22 1313 12/04/22 1315  BP: 104/80  (!) 99/55   Pulse: 85  91   Resp: 16  14   Temp: (!) 97.5 F (36.4 C)   97.7 F (36.5 C)  TempSrc:    Oral  SpO2: 97%  95%   Weight:      PainSc:  Asleep      Isolation Precautions No active isolations  Medications Medications  acetaminophen (TYLENOL) tablet 650 mg (has no administration in time range)    Or  acetaminophen (TYLENOL)  suppository 650 mg (has no administration in time range)  hydrALAZINE (APRESOLINE) injection 10 mg (has no administration in time range)  naloxone [facility]) injection 0.4 mg (has no administration in time range)  fentaNYL (SUBLIMAZE) injection 25 mcg (25 mcg Intravenous Given 12/03/22 2044)  cinacalcet (SENSIPAR) tablet 60 mg (has no administration in time range)  doxercalciferol (HECTOROL) injection 3 mcg (3 mcg Intravenous Given 12/04/22 1108)  Chlorhexidine Gluconate Cloth 2 % PADS 6 each (6 each Topical Not Given 12/03/22 1926)  sevelamer carbonate (RENVELA) tablet 2,400 mg (2,400 mg Oral Given 12/04/22 1427)  lidocaine-EPINEPHrine (XYLOCAINE W/EPI) 1 %-1:100000 (with pres) injection 20 mL (20 mLs Intradermal Not Given 12/03/22 1926)   HYDROmorphone (DILAUDID) injection 0.5 mg (has no administration in time range)  heparin sodium (porcine) 1000 UNIT/ML injection (has no administration in time range)  oxyCODONE-acetaminophen (PERCOCET/ROXICET) 5-325 MG per tablet 1 tablet (1 tablet Oral Given 12/02/22 2110)  sodium zirconium cyclosilicate (LOKELMA) packet 10 g (10 g Oral Given 12/02/22 2309)  heparin injection 2,000 Units (2,000 Units Dialysis Given 12/04/22 0805)  midazolam (VERSED) injection (0.5 mg Intravenous Given 12/03/22 1115)  iohexol (OMNIPAQUE) 300 MG/ML solution 150 mL (20 mLs Intravenous Contrast Given 12/03/22 1129)  heparin sodium (porcine) injection (3,000 Units Intravenous Given 12/03/22 1050)  fentaNYL (SUBLIMAZE) injection (25 mcg Intravenous Given 12/03/22 1122)  ondansetron (ZOFRAN) injection (4 mg Intravenous Given 12/03/22 1127)  oxyCODONE-acetaminophen (PERCOCET/ROXICET) 5-325 MG per tablet 1-2 tablet (1 tablet Oral Given 12/04/22 0837)    Mobility walks     Focused Assessments Renal Assessment Handoff:  Hemodialysis Schedule: Hemodialysis Schedule: Tuesday/Thursday/Saturday Last Hemodialysis date and time: new fistula just placed today   Restricted appendage: left arm   R Recommendations: See Admitting Provider Note  Report given to:   Additional Notes:

## 2022-12-04 NOTE — ED Notes (Signed)
Pt being transferred to inpatient unit via stretcher at this time with all belongings.

## 2022-12-05 ENCOUNTER — Inpatient Hospital Stay (HOSPITAL_COMMUNITY): Payer: Medicare HMO

## 2022-12-05 DIAGNOSIS — T829XXA Unspecified complication of cardiac and vascular prosthetic device, implant and graft, initial encounter: Secondary | ICD-10-CM | POA: Diagnosis not present

## 2022-12-05 DIAGNOSIS — Z01810 Encounter for preprocedural cardiovascular examination: Secondary | ICD-10-CM

## 2022-12-05 DIAGNOSIS — Z992 Dependence on renal dialysis: Secondary | ICD-10-CM | POA: Diagnosis not present

## 2022-12-05 DIAGNOSIS — T82868A Thrombosis of vascular prosthetic devices, implants and grafts, initial encounter: Secondary | ICD-10-CM

## 2022-12-05 DIAGNOSIS — N186 End stage renal disease: Secondary | ICD-10-CM | POA: Diagnosis not present

## 2022-12-05 DIAGNOSIS — E875 Hyperkalemia: Secondary | ICD-10-CM | POA: Diagnosis not present

## 2022-12-05 LAB — VAS US ABI WITH/WO TBI
Left ABI: 0.84
Right ABI: 0.89

## 2022-12-05 MED ORDER — SODIUM CHLORIDE 0.9 % IV BOLUS
1000.0000 mL | Freq: Once | INTRAVENOUS | Status: AC
  Administered 2022-12-05: 1000 mL via INTRAVENOUS

## 2022-12-05 MED ORDER — HYDROMORPHONE HCL 2 MG PO TABS: 4.0000 mg | ORAL_TABLET | ORAL | Status: DC | PRN

## 2022-12-05 MED ORDER — HYDROMORPHONE HCL 1 MG/ML IJ SOLN: 0.5000 mg | INTRAMUSCULAR | Status: DC | PRN

## 2022-12-05 NOTE — Progress Notes (Signed)
VASCULAR LAB    ABI has been performed.  See CV proc for preliminary results.   Vidhi Delellis, RVT 12/05/2022, 5:37 PM

## 2022-12-05 NOTE — Consult Note (Signed)
ASSESSMENT & PLAN   END-STAGE RENAL DISEASE: This patient is undergone recent mechanical thrombectomy and angioplasty of an outflow stenosis in her redo left upper arm graft.  The graft has a weak thrill.  If the graft is not functioning adequately for dialysis she can use her catheter for now.  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  She is already had a redo upper arm graft on the left and is has no further options for revascularization on the left.  Her only remaining option would be a thigh graft.  I have ordered ABIs for preoperative planning.  However for now we will continue to try to use her graft and if this is not possible then she they can use her catheter.  Because of scheduling issues if she does need a thigh graft it would not be done this admission.  REASON FOR CONSULT:    Clotted left arm AV graft.  The consult is requested by [name].   HPI:   Kathleen [name] is a 62 y.o. female who was admitted yesterday with a clotted left upper arm fistula.  She dialyzes on Tuesdays Thursdays and Saturdays.  She was admitted with hyperkalemia.  She had attempted dialysis on 11/30/2022 but was unable to complete this because of pain associated with her fistula.  The patient apparently had a tunneled dialysis catheter placed and then was sent to the emergency department so that she could get hemodialysis.  This patient has had a previous left upper arm fistula.  She had excision of a pseudoaneurysm and a new left AV graft placed on 11/27/2019.  She then had a redo left upper arm graft on 07/29/2020.  Based on previous records she is not a good candidate for access in the right arm as she has had persistent paresthesias in the right hand with evidence of upper extremity arterial occlusive disease.  She would be at high risk for steal on the right.  Her next remaining option would be a thigh graft.  The patient  underwent a fistulogram on 12/03/2022 by interventional radiology.  The patient underwent pharmacological and mechanical thrombectomy.  There was an outflow stenosis which was addressed with a 7 mm drug-eluting balloon.  Patient had an excellent thrill at the completion.  Unfortunately I can only look at the report and I cannot locate the images of the study.  Past Medical History:  Diagnosis Date   Anemia    Arthritis    Cardiac arrest (HCC)    Cellulitis    Cerebral vasculitis    COPD (chronic obstructive pulmonary disease) (HCC)    Elevated antinuclear antibody (ANA) level    Endometriosis    ESRD (end stage renal disease) on dialysis (HCC)    Gallstones    Hypertension    Lupus (HCC)    Migraine    PONV (postoperative nausea and vomiting)    Seizures (HCC)    Sepsis (HCC)    Sleep apnea    Suicide attempt (HCC)    TB (tuberculosis)    Transient ischemic attack (TIA)    Vasculitis of skin     Family History  Problem Relation Age of Onset   Ovarian cancer Mother    Breast cancer Sister    Throat cancer Brother     SOCIAL HISTORY: Social History   Tobacco Use   Smoking status: Some Days    Current packs/day: 0.25  Types: Cigarettes   Smokeless tobacco: Never  Substance Use Topics   Alcohol use: No    No Known Allergies  Current Facility-Administered Medications  Medication Dose Route Frequency Provider Last Rate Last Admin   acetaminophen (TYLENOL) tablet 650 mg  650 mg Oral Q6H PRN Howerter, Justin B, DO       Or   acetaminophen (TYLENOL) suppository 650 mg  650 mg Rectal Q6H PRN Howerter, Justin B, DO       Chlorhexidine Gluconate Cloth 2 % PADS 6 each  6 each Topical Q0600 Delano Metz, MD   6 each at 12/05/22 0559   cinacalcet (SENSIPAR) tablet 60 mg  60 mg Oral Q T,Th,Sat-1800 Delano Metz, MD   60 mg at 12/04/22 1953   doxercalciferol (HECTOROL) injection 3 mcg  3 mcg Intravenous Q T,Th,Sa-HD Delano Metz, MD   3 mcg at 12/04/22 1108   fentaNYL  (SUBLIMAZE) injection 25 mcg  25 mcg Intravenous Q2H PRN Howerter, Justin B, DO   25 mcg at 12/04/22 1919   hydrALAZINE (APRESOLINE) injection 10 mg  10 mg Intravenous Q4H PRN Howerter, Justin B, DO       lidocaine-EPINEPHrine (XYLOCAINE W/EPI) 1 %-1:100000 (with pres) injection 20 mL  20 mL Intradermal Once Gilmer Mor, DO       naloxone [facility]) injection 0.4 mg  0.4 mg Intravenous PRN Howerter, Justin B, DO       sevelamer carbonate (RENVELA) tablet 2,400 mg  2,400 mg Oral TID WC Bell, Lorin C, RPH   2,400 mg at 12/05/22 0743   sodium chloride 0.9 % bolus 1,000 mL  1,000 mL Intravenous Once Delano Metz, MD        REVIEW OF SYSTEMS:  [X]  denotes positive finding, [ ]  denotes negative finding Cardiac  Comments:  Chest pain or chest pressure:    Shortness of breath upon exertion:    Short of breath when lying flat:    Irregular heart rhythm:        Vascular    Pain in calf, thigh, or hip brought on by ambulation:    Pain in feet at night that wakes you up from your sleep:     Blood clot in your veins:    Leg swelling:         Pulmonary    Oxygen at home:    Productive cough:     Wheezing:         Neurologic    Sudden weakness in arms or legs:     Sudden numbness in arms or legs:     Sudden onset of difficulty speaking or slurred speech:    Temporary loss of vision in one eye:     Problems with dizziness:         Gastrointestinal    Blood in stool:     Vomited blood:         Genitourinary    Burning when urinating:     Blood in urine:        Psychiatric    Major depression:         Hematologic    Bleeding problems:    Problems with blood clotting too easily:        Skin    Rashes or ulcers:        Constitutional    Fever or chills:    -  PHYSICAL EXAM:   Vitals:   12/05/22 0557 12/05/22 0716 12/05/22 0732 12/05/22 0739  BP: 98/73 (!) 87/59 Marland Kitchen)  75/61 98/64  Pulse:  81 90   Resp: 18 18 16    Temp: 99.5 F (37.5 C) 98.7 F (37.1 C) 98.7 F (37.1 C)    TempSrc: Oral Oral Oral   SpO2:  97% 99%   Weight:       Body mass index is 18.5 kg/m. GENERAL: The patient is a well-nourished female, in no acute distress. The vital signs are documented above. CARDIAC: There is a regular rate and rhythm.  VASCULAR: I cannot palpate a right radial pulse.  She has a diminished left radial pulse. She has a weak thrill in her left upper arm graft. She has palpable femoral pulses. I cannot palpate pedal pulses.  She has a fairly brisk posterior tibial signal bilaterally but I could not obtain dorsalis pedis signals. PULMONARY: There is good air exchange bilaterally without wheezing or rales. ABDOMEN: Soft and non-tender with normal pitched bowel sounds.  MUSCULOSKELETAL: There are no major deformities. NEUROLOGIC: No focal weakness or paresthesias are detected. SKIN: There are no ulcers or rashes noted. PSYCHIATRIC: The patient has a normal affect.  DATA:    FISTULOGRAM: She underwent mechanical thrombectomy of her left upper arm graft with balloon angioplasty of an outflow stenosis.  Waverly Ferrari [facility]

## 2022-12-05 NOTE — Progress Notes (Addendum)
[facility] Progress Note  Subjective: seen in room, see c/o pain over left arm access. BP's are soft, not symptomatic. Blood cx's are negative. Pt sitting on side of bed eating.   Vitals:   12/05/22 0716 12/05/22 0732 12/05/22 0739 12/05/22 1112  BP: (!) 87/59 (!) 75/61 98/64 95/67   Pulse: 81 90  84  Resp: 18 16  18   Temp: 98.7 F (37.1 C) 98.7 F (37.1 C)  98.3 F (36.8 C)  TempSrc: Oral Oral  Oral  SpO2: 97% 99%  99%  Weight:        Exam: Gen alert, no distress No jvd or bruits Chest clear bilat to bases RRR no MRG Abd soft ntnd no mass or ascites +bs Ext no LE edema Neuro is alert, Ox 3 , nf     LUA AVG remains very tender, +faint bruit medial limb    RIJ TDC in place      Home meds include - tylenol, ferrous sulfate, renavite, pantoprazole, sevelamer carb 3 ac tid      OP HD: TTS [facility]  3h  350/ 1.5   50.3kg   2/2 bath  AVG   Heparin 3600  - last OP HD was 7/20, post wt 50kg   - sensipar 60mg  po three times per week - hectorol 3 mcg IV three times per week - venofer 100mg  q hd IV thru 8/13 - mircera IV q 2 wks, last 7/16, due 7/30     Assessment/ Plan: Clotted L AVG - sp declot AVG by IR here 7/26, greatly appreciate assistance. TDC was placed 7/25 at [facility] as outpatient. Still having a lot of access pain so we are using [facility] for now. AVG bruit is hard to hear today. VVS consulted and saw today.  Access pain - no fever but given pain blood cx's were taken and are negative today.  Doubt infection.  ESRD - on HD TTS. Missed 2 OP HD sessions due to #1. Had HD here 7/25 and yesterday 7/27 using TDC. Next HD 7/30.  Hypotension - not usual for her, will consider IVF bolus.  Volume - 1 kg under dry wt today, took off 2.7 L yesterday. BP's soft today. As above.  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 Next esa due on 7/30.  MBD ckd - CCa and phos are in range. Cont sensipar po, IV vdra and renvela as binder.    Vinson Moselle MD   [facility] 12/05/2022, 2:02 PM  Recent Labs  Lab 12/03/22 0430 12/03/22 0956 12/03/22 1300 12/04/22 0231  HGB 12.5  --  12.6 12.1  ALBUMIN 3.3*  --   --  2.9*  CALCIUM 9.1 8.9  --  9.0  PHOS 3.4  --   --  9.5*  CREATININE 6.48* 9.69*  --  10.88*  K 2.8* 4.3  --  4.4   No results for input(s): "IRON", "TIBC", "FERRITIN" in the last 168 hours. Inpatient medications:  Chlorhexidine Gluconate Cloth  6 each Topical Q0600   cinacalcet  60 mg Oral Q T,Th,Sat-1800   doxercalciferol  3 mcg Intravenous Q T,Th,Sa-HD   lidocaine-EPINEPHrine  20 mL Intradermal Once   sevelamer carbonate  2,400 mg Oral TID WC    acetaminophen **OR** acetaminophen, fentaNYL (SUBLIMAZE) injection, hydrALAZINE, naLOXone (NARCAN)  injection

## 2022-12-05 NOTE — Progress Notes (Signed)
   12/05/22 0732  Assess: MEWS Score  Temp 98.7 F (37.1 C)  BP (!) 75/61  MAP (mmHg) 66  Pulse Rate 90  ECG Heart Rate 89  Resp 16  Level of Consciousness Alert  SpO2 99 %  O2 Device Room Air  Patient Activity (if Appropriate) In bed  Assess: MEWS Score  MEWS Temp 0  MEWS Systolic 2  MEWS Pulse 0  MEWS RR 0  MEWS LOC 0  MEWS Score 2  MEWS Score Color Yellow  Assess: if the MEWS score is Yellow or Red  Were vital signs accurate and taken at a resting state? No, vital signs rechecked  Does the patient meet 2 or more of the SIRS criteria? No  Assess: SIRS CRITERIA  SIRS Temperature  0  SIRS Pulse 0  SIRS Respirations  0  SIRS WBC 0  SIRS Score Sum  0   RN obtained VS, patient is alert and oriented eating breakfast at the edge of the bed, RN rechecked BP and current BP is 98/64.

## 2022-12-05 NOTE — Plan of Care (Signed)

## 2022-12-05 NOTE — Progress Notes (Signed)
[facility] PROGRESS NOTE  Kathleen [name] (DOB: 1960-10-21) NFA:[id number] PCP: Kathleen Priest, NP  Brief Narrative: Kathleen [name] is a 62 y.o. female with a history of ESRD (HD TTS), recently clotted LUE AVF s/p right IJ [facility] 7/25 who presented to the ED on 12/02/2022 with continued LUE pain. She was overloaded with hyperkalemia having missed dialysis. HD was performed and IR consulted for declot. After procedure, patient suffered hemorrhage which has improved. The pain in her left upper arm continues, duplex demonstrating reclot. Vascular surgery consulted, stating no further revascularization options, ordered ABIs in preparation for consideration of thigh graft to be placed as outpatient. Her pain has been severe, she's had low grade fevers, so blood cultures have been drawn.   Subjective: She had pain in her neck since the time the [facility] was placed, says they stuck her 3 different times. The pain has only gotten worse since that time. She still has severe pain in the left arm AVG requiring IV fentanyl x2 overnight. Had temperatures of 99.33F overnight.    Objective: BP 95/67 (BP Location: Right Arm)   Pulse 84   Temp 98.3 F (36.8 C) (Oral)   Resp 18   Wt 48.9 kg   SpO2 99%   BMI 18.50 kg/m   Gen: Frail female in evident pain Neck: Tunneled right IJ catheter is tender throughout without erythema or discharge.  Pulm: Clear, nonlabored  CV: RRR, no MRG, no edema GI: Soft, NT, ND, +BS Neuro: Alert and oriented. No new focal deficits. Ext: Warm, no deformities. Skin: Left AVG exquisitely tender to palpation with decreased thrill and bruit today, There is no overlying erythema. Stay sutures in place. No discharge.  Assessment & Plan: Left AVG clot: s/p mechanical thrombectomy and angioplasty for outflow stenosis in her redo AVG. s/p declot 7/26 by IR. Vascular surgery consulted as revision would be needed to make this functional. Dr. Edilia [name] has suggested there are no other  options for revascularization of LUE.  - Significant pain continues, also with low grade fevers and recent central line placed. Blood cultures drawn 7/27 at 10pm. they are no growth as of this morning (< 12 hours). She is hypotensive this morning, ordered for 1L IVF per nephrology. There remains a distinct possibility of infection, so will continue monitoring her for now. Low threshold for IV antibiotics if cultures are positive, fever or other instability develops.  - Continue IV and po prn medications for pain. Change to dilaudid.   ESRD:  - Continue routine HD TTS per nephrology now thru [facility]. s/p 7/26 then back on schedule 7/27.  - Having ABIs for planning of thigh graft as her arterial occlusive disease limits options on RUE. Will follow up for this per vascular surgery.   Hyperkalemia:  - Resolved with HD.    Anemia of CKD with acute blood loss without anemia:  - Monitor intermittently.  Kathleen Nine, MD [facility] [email or web address] 12/05/2022, 2:22 PM

## 2022-12-06 ENCOUNTER — Telehealth: Payer: Self-pay | Admitting: Vascular Surgery

## 2022-12-06 DIAGNOSIS — T82868A Thrombosis of vascular prosthetic devices, implants and grafts, initial encounter: Secondary | ICD-10-CM | POA: Diagnosis not present

## 2022-12-06 DIAGNOSIS — E875 Hyperkalemia: Secondary | ICD-10-CM | POA: Diagnosis not present

## 2022-12-06 DIAGNOSIS — N186 End stage renal disease: Secondary | ICD-10-CM | POA: Diagnosis not present

## 2022-12-06 DIAGNOSIS — Z992 Dependence on renal dialysis: Secondary | ICD-10-CM | POA: Diagnosis not present

## 2022-12-06 MED ORDER — CINACALCET HCL 30 MG PO TABS: 60.0000 mg | ORAL_TABLET | ORAL | 0 refills | Status: AC

## 2022-12-06 NOTE — Progress Notes (Signed)
D/C order noted. Contacted [facility] to advise clinic of pt's d/c today and that pt should resume care tomorrow.   Olivia Canter Renal Navigator [phone]

## 2022-12-06 NOTE — Progress Notes (Signed)
Nurse requested Mobility Specialist to perform oxygen saturation test with pt which includes removing pt from oxygen both at rest and while ambulating.  Below are the results from that testing.     Patient Saturations on Room Air at Rest = spO2 98%  Patient Saturations on Room Air while Ambulating = sp02 93% .  Rested and performed pursed lip breathing for 1 minute with sp02 at 92%.  At end of testing pt left in room on 0  Liters of oxygen.  Reported results to nurse.

## 2022-12-06 NOTE — Progress Notes (Signed)
[facility] Progress Note  Subjective: seen in room, pt up and dressed, waiting for d/c.  Still tender in the L arm but says less so.  No f/c/dyspnea.   Vitals:   12/05/22 2039 12/06/22 0015 12/06/22 0445 12/06/22 0719  BP: 111/75 118/65 113/70 (!) 109/98  Pulse: 79 87 78   Resp: 18 20 19 18   Temp: 98.3 F (36.8 C) 98 F (36.7 C) 98.4 F (36.9 C) 98.4 F (36.9 C)  TempSrc: Oral Oral Oral Oral  SpO2: 98% 100% 94%   Weight:   48.7 kg     Exam: Gen alert, no distress No jvd or bruits Chest clear bilat to bases RRR no MRG Abd soft ntnd no mass or ascites +bs Ext no LE edema Neuro is alert, Ox 3 , nf     LUA AVG remains moderatly tender, +faint bruit medial limb    RIJ TDC in place      Home meds include - tylenol, ferrous sulfate, renavite, pantoprazole, sevelamer carb 3 ac tid      OP HD: TTS [facility]  3h  350/ 1.5   50.3kg   2/2 bath  AVG   Heparin 3600  - last OP HD was 7/20, post wt 50kg   - sensipar 60mg  po three times per week - hectorol 3 mcg IV three times per week - venofer 100mg  q hd IV thru 8/13 - mircera IV q 2 wks, last 7/16, due 7/30     Assessment/ Plan: Clotted L AVG - sp declot AVG by IR here 7/26, greatly appreciate assistance. TDC was placed 7/25 at [facility] as outpatient. Still having a lot of access pain so we are using [facility] for now. AVG bruit+ but somewhat weak.  Would keep TDC in place at least a few weeks if using AVG to ensure it's reliable.  VVS has seen --> if LUE AVG rethromboses soon would need to proceed on to thigh AVG as outpt.    Access pain - no fever but given pain blood cx's were taken and are negative today.  Doubt infection.   Seems to slowly be improving.  ESRD - on HD TTS. Missed 2 OP HD sessions due to #1. Had HD here 7/25 and yesterday 7/27 using TDC. Next HD 7/30 - ok for outpt.  Hypotension - improved, asymptomatic.  Volume - appearing euvolemic today.  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  Next esa due on 7/30.  MBD ckd - CCa and phos are in range. Cont sensipar po, IV vdra and renvela as binder.   Estill Bakes MD [facility] Pager [phone]   Recent Labs  Lab 12/03/22 0430 12/03/22 0956 12/04/22 0231 12/06/22 0155  HGB 12.5   < > 12.1 11.1*  ALBUMIN 3.3*  --  2.9*  --   CALCIUM 9.1   < > 9.0 8.2*  PHOS 3.4  --  9.5*  --   CREATININE 6.48*   < > 10.88* 9.98*  K 2.8*   < > 4.4 3.9   < > = values in this interval not displayed.   No results for input(s): "IRON", "TIBC", "FERRITIN" in the last 168 hours. Inpatient medications:  Chlorhexidine Gluconate Cloth  6 each Topical Q0600   cinacalcet  60 mg Oral Q T,Th,Sat-1800   doxercalciferol  3 mcg Intravenous Q T,Th,Sa-HD   lidocaine-EPINEPHrine  20 mL Intradermal Once   sevelamer carbonate  2,400 mg Oral TID WC  acetaminophen **OR** acetaminophen, hydrALAZINE, HYDROmorphone (DILAUDID) injection, HYDROmorphone, naLOXone (NARCAN)  injection

## 2022-12-06 NOTE — TOC Initial Note (Addendum)
Transition of Care [facility]) - Initial/Assessment Note    Patient Details  Name: Kathleen [name] MRN: [id number]\ Date of Birth: 1960-05-13  Transition of Care [facility]) CM/SW Contact:    Leone Haven, RN Phone Number: 12/06/2022, 10:01 AM  Clinical Narrative:                 From home with a friend she stays with and takes care of per patient.  She has PCP on file, Dr. Suzie Portela, she has insurance on file.  She states she currently does not have any HH services in place at this time, or DME at home. She is self ambulatory.  She states her finance, Briant Sites will transport her home today, he is also her support system. Patient states she thinks she needs oxygen, NCM asked Mobility tech to do a walking saturation to check to see if she needs oxygen.  Awating results. Per ambulatory walk for oxygen, patient does not qualify for oxygen.    Expected Discharge Plan: Home/Self Care Barriers to Discharge: No Barriers Identified   Patient Goals and CMS Choice Patient states their goals for this hospitalization and ongoing recovery are:: return home with friend   Choice offered to / list presented to : NA      Expected Discharge Plan and Services In-house Referral: NA Discharge Planning Services: CM Consult Post Acute Care Choice: NA Living arrangements for the past 2 months: Single Family Home Expected Discharge Date: 12/06/22               DME Arranged: N/A DME Agency: NA       HH Arranged: NA          Prior Living Arrangements/Services Living arrangements for the past 2 months: Single Family Home Lives with:: Friends Patient language and need for interpreter reviewed:: Yes Do you feel safe going back to the place where you live?: Yes      Need for Family Participation in Patient Care: Yes (Comment) Care giver support system in place?: Yes (comment)   Criminal Activity/Legal Involvement Pertinent to Current Situation/Hospitalization: No - Comment as needed  Activities  of Daily Living      Permission Sought/Granted Permission sought to share information with : Case Manager Permission granted to share information with : Yes, Release of Information Signed              Emotional Assessment Appearance:: Appears stated age Attitude/Demeanor/Rapport: Engaged Affect (typically observed): Appropriate Orientation: : Oriented to Self, Oriented to Place, Oriented to  Time, Oriented to Situation Alcohol / Substance Use: Not Applicable Psych Involvement: No (comment)  Admission diagnosis:  Hyperkalemia [E87.5] ESRD (end stage renal disease) (HCC) [N18.6] Complication of arteriovenous dialysis fistula [T82.9XXA] Patient Active Problem List   Diagnosis Date Noted   History of anemia due to chronic kidney disease 12/03/2022   Complication of arteriovenous dialysis fistula 12/02/2022   C. difficile colitis 10/29/2022   Acute GI bleeding 10/27/2022   Hypokalemia 12/25/2021   Blood in stool    Homeless    Noninfectious gastroenteritis    Adenomatous polyp of sigmoid colon    Acute gastric ulcer with hemorrhage    Duodenal ulcer    Adrenal nodule (HCC) 12/22/2021   Uremia 05/16/2021   Rectal bleeding 05/15/2021   Acute on chronic combined systolic and diastolic CHF (congestive heart failure) (HCC) 05/15/2021   FTT (failure to thrive) in adult 05/15/2021   Hyperkalemia 03/12/2021   Prolonged QT interval 03/12/2021   Acute respiratory failure  with hypoxia and hypercapnia (HCC) 03/12/2021   Acute on chronic diastolic congestive heart failure (HCC) 03/12/2021   Onychomycosis 11/25/2018   Pre-ulcerative calluses 11/25/2018   Neuropathy 10/04/2018   Acute encephalopathy 07/11/2017   End stage renal disease on dialysis (HCC) 07/11/2017   COPD with acute exacerbation (HCC)    Anemia in ESRD (end-stage renal disease) (HCC)    Coagulation defect, unspecified (HCC) 07/09/2017   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/2017   Iron  deficiency anemia, unspecified 07/09/2017   Secondary hyperparathyroidism of renal origin (HCC) 07/09/2017   Tobacco use 07/09/2017   Tobacco dependence 11/30/2016   Malnutrition of moderate degree 07/14/2016   Lupus (HCC) 06/28/2016   Hypertension 06/28/2016   SLE exacerbation (HCC) 06/28/2016   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   High risk medication use 05/31/2012   PCP:  Hildred Priest, NP Pharmacy:   [id number] - [place], [place] - [address] AT [facility] [address] [place] [address] Phone: [phone] Fax: [phone]  [id number] - [place], [place] - [address] [place] [address] Phone: [phone] Fax: [phone]     Social Determinants of Health (SDOH) Social History: SDOH Screenings   Food Insecurity: Unknown (08/22/2022)   [facility]  Tobacco Use: High Risk (12/03/2022)   SDOH Interventions:     Readmission Risk Interventions    12/06/2022    9:57 AM 09/09/2021   12:39 PM  Readmission Risk Prevention Plan  Transportation Screening Complete Complete  PCP or Specialist Appt within 3-5 Days Complete   HRI or Home Care Consult Complete   Social Work Consult for Recovery Care Planning/Counseling --   Palliative Care Screening Not Applicable   Medication Review Oceanographer) Complete Complete  PCP or Specialist appointment within 3-5 days of discharge  Complete  HRI or Home Care Consult  Complete  SW Recovery Care/Counseling Consult  Complete  Palliative Care Screening  Not Applicable  Skilled Nursing Facility  Not Applicable

## 2022-12-06 NOTE — Telephone Encounter (Signed)
-----   Message from Waverly Ferrari sent at 12/06/2022  9:31 AM EDT ----- Regarding: charge Level 1 follow-up visit. She needs an office visit in 4 to 6 weeks on a Monday Wednesday or Friday (she dialyzes Tuesday Thursday Saturday).  She needs to be evaluated for a thigh graft and she will likely need preoperative arteriography.  She does not need ABIs as this was done in the hospital.  This could be on the MD schedule or PA schedule.  Thank you. CD

## 2022-12-06 NOTE — Telephone Encounter (Signed)
Pt still in hospital

## 2022-12-06 NOTE — Progress Notes (Signed)
RN went over discharge instructions with patient at the bedside. RN went over medication changes and follow up visits. Patient provided teach back and verbalize understanding. NT removed PIV. Tele monitor has been removed and CCMD notified. Transportation is at the bedside. Patient is eating lunch and then will get dressed.

## 2022-12-06 NOTE — Discharge Summary (Signed)
Kathleen [name] WNU:[id number] DOB: Mar 26, 1961 DOA: 12/02/2022  PCP: Kathleen [name]  Admit date: 12/02/2022 Discharge date: 12/06/2022  Time spent: 35 minutes  Recommendations for Outpatient Follow-up:  Pcp, nephrology, and vascular surgery f/u     Discharge Diagnoses:  Principal Problem:   Hyperkalemia Active Problems:   End stage renal disease on dialysis [facility])   Complication of arteriovenous dialysis fistula   History of anemia due to chronic kidney disease   Discharge Condition: stable  Diet recommendation: low sodium heart healthy  Filed Weights   12/05/22 0511 12/06/22 0445  Weight: 48.9 kg 48.7 kg    History of present illness:  From admission h and p ackie Easton [name] is a 62 y.o. female with medical history significant for end-stage renal disease on hemodialysis on Tuesday, Thursday, Saturday schedule, anemia of chronic kidney disease associated baseline hemoglobin 10-12, who is admitted to [facility] on 12/02/2022 with hyperkalemia after presenting from home to [facility] ED complaining of clot in left upper extremity AV fistula.    The patient confirms that she has end-stage renal disease on hemodialysis, on Tuesday, Thursday, Saturday schedule.  She conveys that she attempted to attend her scheduled hemodialysis session scheduled for Tuesday, 11/30/2022, at which time the totality of the HD session was not able to be completed, due to pain associated with the left upper extremity AV fistula site, which led to discovery of associated clot within the fistula.  Consequently, she notes that her most recent the completed hemodialysis session occurred on Saturday, 11/27/2022.  She returned to the dialysis center on 12/02/2022, at which time a tunneled HD catheter was placed and the patient was instructed to present to the emergency department to assist with expediting hemodialysis.    The patient is without complaint at this time, she denies any recent shortness of breath,  cough, orthopnea, PND, or any significant worsening of her peripheral edema.  Denies any recent chest pain, palpitations, diaphoresis, nausea, vomiting, dizziness, presyncope, or syncope.  No recent change in appetite or taste.  Hospital Course:  Patient presents for malfunctioning left upper extremity AV fistula with incomplete recent dialysis and hyperkalemia. Declot procedure performed by IR on 7/26 complicated by hemorrhage which has been controlled. Repeat duplex demonstrates more clot on the left. Dialysis performed using patient tunneled dialysis catheter. 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 Unable to perform that in the hospital this admission but ABIs performed for preoperative planning. Plan will be continue tts hemodialysis using left av graft if possible and TDC if not possible and continued outpatient access planning. Patient was kept an additional 24 hours with concern for bloodstream infection but no fever or leukocytosis and blood cultures ngtd so think safe to discharge today/  Procedures: IR mechanical and pharmacologic thrombectomy left AV graft  Consultations: IR, vascular, nephrology  Discharge Exam: Vitals:   12/06/22 0445 12/06/22 0719  BP: 113/70 (!) 109/98  Pulse: 78   Resp: 19 18  Temp: 98.4 F (36.9 C) 98.4 F (36.9 C)  SpO2: 94%     General: chronically ill appearing Cardiovascular: soft systolic murmur Respiratory: CTAB Left arm: sutures in place, thrill  Discharge Instructions   Discharge Instructions     Diet - low sodium heart healthy   Complete by: As directed    Increase activity slowly   Complete by: As directed  Allergies as of 12/06/2022   No Known Allergies      Medication List     TAKE these medications    acetaminophen 160 MG/5ML liquid Commonly known as: TYLENOL Take 320 mg by mouth every 6  (six) hours as needed for fever or pain.   cinacalcet 30 MG tablet Commonly known as: SENSIPAR Take 2 tablets (60 mg total) by mouth every Tuesday, Thursday, and Saturday at 6 PM. Start taking on: December 07, 2022   ferrous sulfate 325 (65 FE) MG tablet Take 325 mg by mouth daily.   multivitamin Tabs tablet Take 1 tablet by mouth daily.   pantoprazole 40 MG tablet Commonly known as: Protonix Take 1 tablet (40 mg total) by mouth 2 (two) times daily.   sevelamer carbonate 800 MG tablet Commonly known as: RENVELA Take 800-2,400 mg by mouth See admin instructions. 2400 mg three times daily with meals, 800 mg with snacks.       No Known Allergies  Follow-up Information     Kathleen [name] Follow up.   Specialty: Internal Medicine Contact information: [address] [place] [address] [phone]         Kathleen Hint, MD Follow up.   Specialties: Vascular Surgery, Cardiology Contact information: [address] [place] [address] [phone]                  The results of significant diagnostics from this hospitalization (including imaging, microbiology, ancillary and laboratory) are listed below for reference.    Significant Diagnostic Studies: VAS [place] ABI WITH/WO TBI  Result Date: 12/05/2022  LOWER EXTREMITY DOPPLER STUDY Patient Name:  Kathleen [name]  Date of Exam:   12/05/2022 Medical Rec #: [id number]           Accession #:    [id number] Date of Birth: 08/16/1960           Patient Gender: F Patient Age:   28 years Exam Location:  [facility] Procedure:      VAS [place] ABI WITH/WO TBI Referring Phys: Kathleen [name] --------------------------------------------------------------------------------  Indications: Pre operative exam prior to thigh graft High Risk Factors: Hypertension, current smoker. Other Factors: ESRD on dialysis, Lupus, CHF.  Vascular Interventions: Left upper extremity AV graft placement 11/27/19 and                          07/29/20, right AV fistula placement 10/27/16. Limitations: Today's exam was limited secondary to insufficient amount of              subcutaneous tissue in foot to allow ultrasound waves to penetrate.              (BMI 18). Comparison Study: Prior ABI 04/23/20 Performing Technologist: Sherren Kerns RVS  Examination Guidelines: A complete evaluation includes at minimum, Doppler waveform signals and systolic blood pressure reading at the level of bilateral brachial, anterior tibial, and posterior tibial arteries, when vessel segments are accessible. Bilateral testing is considered an integral part of a complete examination. Photoelectric Plethysmograph (PPG) waveforms and toe systolic pressure readings are included as required and additional duplex testing as needed. Limited examinations for reoccurring indications may be performed as noted.  ABI Findings: +---------+------------------+-----+-----------+--------+ Right    Rt Pressure (mmHg)IndexWaveform   Comment  +---------+------------------+-----+-----------+--------+ Brachial 96                     Multiphasic         +---------+------------------+-----+-----------+--------+  PTA      85                0.89 multiphasic         +---------+------------------+-----+-----------+--------+ DP       78                0.81                     +---------+------------------+-----+-----------+--------+ Great Toe0                 0.00 Absent              +---------+------------------+-----+-----------+--------+ +---------+------------------+-----+-----------+----------------+ Left     Lt Pressure (mmHg)IndexWaveform   Comment          +---------+------------------+-----+-----------+----------------+ Brachial                                   restricted/graft +---------+------------------+-----+-----------+----------------+ PTA      81                0.84 multiphasic                  +---------+------------------+-----+-----------+----------------+ DP       78                0.81 multiphasic                 +---------+------------------+-----+-----------+----------------+ Great Toe0                 0.00 Absent                      +---------+------------------+-----+-----------+----------------+ +-------+-----------+-----------+------------+-------------------------+ ABI/TBIToday's ABIToday's TBIPrevious ABIPrevious TBI              +-------+-----------+-----------+------------+-------------------------+ Right  0.89       absent     0.99        low amplitude of waveform +-------+-----------+-----------+------------+-------------------------+ Left   0.84       absent     0.99        low amplitude of waveform +-------+-----------+-----------+------------+-------------------------+  Bilateral ABIs appear decreased compared to prior study on 04/23/20. Bilateral TBIs appear essentially unchanged compared to prior study on 04/23/20.  Summary: Right: Resting right ankle-brachial index indicates mild right lower extremity arterial disease. The right toe-brachial index is abnormal. Left: Resting left ankle-brachial index indicates mild left lower extremity arterial disease. The left toe-brachial index is abnormal. *See table(s) above for measurements and observations.     Preliminary    VAS US DUPLEX DIALYSIS ACCESS (AVF, AVG)  Result Date: 12/05/2022 DIALYSIS ACCESS Patient Name:  SADHANA CINTORA  Date of Exam:   12/04/2022 Medical Rec #: [id number]           Accession #:    [id number] Date of Birth: July 17, 1960           Patient Gender: F Patient Age:   30 years Exam [facility]  [facility] Procedure:      VAS US DUPLEX DIALYSIS ACCESS (AVF, AVG) Referring Phys: Hazeline Junker --------------------------------------------------------------------------------  Reason for Exam: Pain in axilla. Access Site: Left Upper Extremity. Access Type: Upper arm loop AVG. History:  Left upper arm pain with recently declotting of AVG. Nephrology also          notes decreased thrill today. On 12/02/22 [facility] vascular attempted declot,          but  patient was in too much pain, so TDC was placed. Patient had          thrombectomy by IR 12/03/22, but suffered hemorrhage post procedure. Limitations: Severe pain Comparison Study: Prior study done 10/13/2020 Performing Technologist: Sherren Kerns RVS  Examination Guidelines: A complete evaluation includes B-mode imaging, spectral Doppler, color Doppler, and power Doppler as needed of all accessible portions of each vessel. Unilateral testing is considered an integral part of a complete examination. Limited examinations for reoccurring indications may be performed as noted.  Findings:    Summary: Arteriovenous graft-Ultrasound characteristics suggest probable thrombus noted in graft in axilla at site of severe pain. Velocities appear diminished throughout AVG.   Diagnosing physician: Waverly Ferrari MD Electronically signed by Waverly Ferrari MD on 12/05/2022 at 10:32:02 AM.    --------------------------------------------------------------------------------   Final    IR THROMBECTOMY AV FISTULA W/THROMBOLYSIS INC/SHUNT/IMG LEFT  Result Date: 12/03/2022 INDICATION: 62 year old female with a history of left upper extremity dialysis circuit thrombosis. She presents for attempt at declot EXAM: ULTRASOUND-GUIDED ACCESS LEFT UPPER EXTREMITY DIALYSIS CIRCUIT X2 FISTULAGRAM MECHANICAL AND PHARMACOLOGIC THROMBECTOMY OF LEFT UPPER EXTREMITY DIALYSIS CIRCUIT BALLOON ANGIOPLASTY WITH DRUG-ELUTING BALLOON FOR TREATMENT OF VENOUS OUTFLOW STENOSIS MEDICATIONS: 5 mg tPA 3000 units IV heparin ANESTHESIA/SEDATION: Moderate (conscious) sedation was employed during this procedure. A total of Versed 1.5 mg and Fentanyl 75 mcg was administered intravenously by the radiology nurse. Total intra-service moderate Sedation Time: 48 minutes. The patient's level of  consciousness and vital signs were monitored continuously by radiology nursing throughout the procedure under my direct supervision. FLUOROSCOPY: Radiation Exposure Index (as provided by the fluoroscopic device): 5.8 mGy Kerma COMPLICATIONS: None PROCEDURE: The procedure, risks, benefits, and alternatives were explained to the patient and the patient's family, including bleeding, infection, arterial thrombus, venous thrombus, vessel injury, need for further procedure, need for stenting, contrast reaction, need for catheter placement, cardiopulmonary collapse, death. Questions regarding the procedure were encouraged and answered. The patient understands and consents to the procedure. The left upper extremity was prepped and draped in the usual sterile fashion Ultrasound survey was performed with images stored and sent to PACs. The ultrasound demonstrates evidence of 2 adjacent dialysis circuit. One of these has clearly been excluded after a second surgery. The echogenic material within the lumen of the current circuit was used to identify the proper puncture site. This appears to represent a brachial-brachial dialysis circuit/graft. Ultrasound guidance was used to access the upper extremity graft with a micropuncture kit, directed centrally. Exchange was made for a 7 [place] short sheath. Ultrasound guidance was then used access the fistula directed toward the arterial anastomosis. Was removed and the micro wire was left in place. A combination of both a Glidewire and a Bentson wire were used to navigate a short Kumpe the catheter into the subclavian vein. Venogram performed confirming no significant central stenosis. Bentson wire was placed and initial 6 mm x 40 mm balloon angioplasty was performed in the stenotic axillary vein, which is identified as the major site of failure. Kumpe the catheter was then passed on the Bentson wire and the wire was removed. Upon withdrawal of the angled catheter, a solution of 3  milligrams of tPA in saline was infused into the outflow portion of the graft. The Kumpe the catheter was then navigated again centri with combination of Glidewire and a Bentson wire. A 6 mm balloon catheter was used to macerate the thrombosed segment. The micro sheath was then placed on the wire directed towards the arterial  anastomosis. This micropuncture set was then used to place a Bentson wire and a 6 [place] short sheath was placed. Glidewire was navigated across the arterial anastomosis, with an over the wire Fogarty balloon advanced into the artery. The arterial plug was pulled, with some difficulty withdrawing the Fogarty balloon through the arterial anastomosis. A stenosis was presumed at the arterial anastomosis. We then inflated a 4 mm x 40 mm balloon, identifying no significant stenosis. Angiogram through the balloon catheter confirmed no significant stenosis at the arterial anastomosis. At this point there has been restoration of flow through the graft. Repeat balloon angioplasty was then performed at the venous outflow, 7 mm x 40 mm balloon angioplasty at the proximal brachial vein, and the axillary vein. After standard balloon angioplasty, we treated the entire segment with a 7 mm x 60 mm drug-eluting balloon. A 3 minute time interval was observed. Repeat angiogram was performed. Catheters and wires were removed. No residual stenosis was identified in the venous outflow. Excellent thrill was confirmed at the conclusion. The short sheaths were removed after placement of hemostasis sutures. Patient tolerated the procedure well and remained hemodynamically stable throughout. No complications were encountered and no significant blood loss was encountered. 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 The patient has old circuit which has been surgically excluded. The updated circuit has a typical flow pattern, which is counter clockwise from the brachial artery in the axillary region, towards  the shoulder. 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 No residual stenosis on completion. Excellent thrill on completion. 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 Signed, Yvone Neu. Miachel Roux, RPVI [facility] Radiology ACCESS: This access remains amenable to future percutaneous interventions as clinically indicated. Electronically Signed   By: Gilmer Mor D.O.   On: 12/03/2022 12:21   IR US Guide Vasc Access Left  Result Date: 12/03/2022 INDICATION: 62 year old female with a history of left upper extremity dialysis circuit thrombosis. She presents for attempt at declot EXAM: ULTRASOUND-GUIDED ACCESS LEFT UPPER EXTREMITY DIALYSIS CIRCUIT X2 FISTULAGRAM MECHANICAL AND PHARMACOLOGIC THROMBECTOMY OF LEFT UPPER EXTREMITY DIALYSIS CIRCUIT BALLOON ANGIOPLASTY WITH DRUG-ELUTING BALLOON FOR TREATMENT OF VENOUS OUTFLOW STENOSIS MEDICATIONS: 5 mg tPA 3000 units IV heparin ANESTHESIA/SEDATION: Moderate (conscious) sedation was employed during this procedure. A total of Versed 1.5 mg and Fentanyl 75 mcg was administered intravenously by the radiology nurse. Total intra-service moderate Sedation Time: 48 minutes. The patient's level of consciousness and vital signs were monitored continuously by radiology nursing throughout the procedure under my direct supervision. FLUOROSCOPY: Radiation Exposure Index (as provided by the fluoroscopic device): 5.8 mGy Kerma COMPLICATIONS: None PROCEDURE: The procedure, risks, benefits, and alternatives were explained  to the patient and the patient's family, including bleeding, infection, arterial thrombus, venous thrombus, vessel injury, need for further procedure, need for stenting, contrast reaction, need for catheter placement, cardiopulmonary collapse, death. Questions regarding the procedure were encouraged and answered. The patient understands and consents to the procedure. The left upper extremity was prepped and draped in the usual sterile fashion Ultrasound survey was performed with images stored and sent to PACs. The ultrasound demonstrates evidence of 2 adjacent dialysis circuit.  One of these has clearly been excluded after a second surgery. The echogenic material within the lumen of the current circuit was used to identify the proper puncture site. This appears to represent a brachial-brachial dialysis circuit/graft. Ultrasound guidance was used to access the upper extremity graft with a micropuncture kit, directed centrally. Exchange was made for a 7 [place] short sheath. Ultrasound guidance was then used access the fistula directed toward the arterial anastomosis. Was removed and the micro wire was left in place. A combination of both a Glidewire and a Bentson wire were used to navigate a short Kumpe the catheter into the subclavian vein. Venogram performed confirming no significant central stenosis. Bentson wire was placed and initial 6 mm x 40 mm balloon angioplasty was performed in the stenotic axillary vein, which is identified as the major site of failure. Kumpe the catheter was then passed on the Bentson wire and the wire was removed. Upon withdrawal of the angled catheter, a solution of 3 milligrams of tPA in saline was infused into the outflow portion of the graft. The Kumpe the catheter was then navigated again centri with combination of Glidewire and a Bentson wire. A 6 mm balloon catheter was used to macerate the thrombosed segment. The micro sheath was then placed on the wire directed towards the  arterial anastomosis. This micropuncture set was then used to place a Bentson wire and a 6 [place] short sheath was placed. Glidewire was navigated across the arterial anastomosis, with an over the wire Fogarty balloon advanced into the artery. The arterial plug was pulled, with some difficulty withdrawing the Fogarty balloon through the arterial anastomosis. A stenosis was presumed at the arterial anastomosis. We then inflated a 4 mm x 40 mm balloon, identifying no significant stenosis. Angiogram through the balloon catheter confirmed no significant stenosis at the arterial anastomosis. At this point there has been restoration of flow through the graft. Repeat balloon angioplasty was then performed at the venous outflow, 7 mm x 40 mm balloon angioplasty at the proximal brachial vein, and the axillary vein. After standard balloon angioplasty, we treated the entire segment with a 7 mm x 60 mm drug-eluting balloon. A 3 minute time interval was observed. Repeat angiogram was performed. Catheters and wires were removed. No residual stenosis was identified in the venous outflow. Excellent thrill was confirmed at the conclusion. The short sheaths were removed after placement of hemostasis sutures. Patient tolerated the procedure well and remained hemodynamically stable throughout. No complications were encountered and no significant blood loss was encountered. 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 The patient has old circuit which has been surgically excluded. The updated circuit has a typical flow pattern, which is counter clockwise from the brachial artery in the axillary region, towards the shoulder. 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 No residual stenosis on completion. Excellent thrill on completion. IMPRESSION: Status post ultrasound guided access of left upper  extremity brachial-brachial dialysis graft, for pharmacologic and mechanical thrombectomy to restore flow after previous thrombosis. 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 Signed, Yvone Neu. Miachel Roux, RPVI [facility] Radiology ACCESS: This access remains amenable to future percutaneous interventions as clinically indicated. Electronically Signed   By: Gilmer Mor D.O.   On: 12/03/2022 12:21    Microbiology:  Recent Results (from the past 240 hour(s))  Culture, blood (Routine X 2) w Reflex to ID Panel     Status: None (Preliminary result)   Collection Time: 12/04/22 10:13 PM   Specimen: BLOOD  Result Value Ref Range Status   Specimen Description BLOOD BLOOD RIGHT HAND  Final   Special Requests   Final    BOTTLES DRAWN AEROBIC AND ANAEROBIC Blood Culture adequate volume   Culture   Final    NO GROWTH < 12 HOURS Performed at [facility], [address] [place], [place] [address]    Report Status PENDING  Incomplete  Culture, blood (Routine X 2) w Reflex to ID Panel     Status: None (Preliminary result)   Collection Time: 12/04/22 10:13 PM   Specimen: BLOOD  Result Value Ref Range Status   Specimen Description BLOOD BLOOD RIGHT HAND  Final   Special Requests   Final    BOTTLES DRAWN AEROBIC AND ANAEROBIC Blood Culture adequate volume   Culture   Final    NO GROWTH < 12 HOURS Performed at [facility], [address], [place], [place] [address]    Report Status PENDING  Incomplete     Labs: Basic Metabolic Panel: Recent Labs  Lab 12/02/22 2128 12/03/22 0430 12/03/22 0956 12/04/22 0231 12/06/22 0155  NA 135 134* 135 135 133*  K 5.7* 2.8* 4.3 4.4 3.9  CL 105 93* 94* 92* 92*  CO2  --  25 22 24 25   GLUCOSE 73 73 69* 101* 88  BUN 66* 20 30* 47* 37*  CREATININE >18.00* 6.48* 9.69* 10.88* 9.98*  CALCIUM  --  9.1  8.9 9.0 8.2*  MG  --  1.8  --   --   --   PHOS  --  3.4  --  9.5*  --    Liver Function Tests: Recent Labs  Lab 12/03/22 0430 12/04/22 0231  AST 25  --   ALT 12  --   ALKPHOS 74  --   BILITOT 0.9  --   PROT 8.6*  --   ALBUMIN 3.3* 2.9*   No results for input(s): "LIPASE", "AMYLASE" in the last 168 hours. No results for input(s): "AMMONIA" in the last 168 hours. CBC: Recent Labs  Lab 12/02/22 2109 12/02/22 2128 12/03/22 0430 12/03/22 1300 12/04/22 0231 12/06/22 0155  WBC 7.0  --  6.2  --   --  6.2  NEUTROABS 4.4  --  4.1  --   --   --   HGB 11.3* 12.2 12.5 12.6 12.1 11.1*  HCT 36.6 36.0 39.8 40.3 38.5 36.4  MCV 93.6  --  91.5  --   --  95.0  PLT 200  --  202  --   --  191   Cardiac Enzymes: No results for input(s): "CKTOTAL", "CKMB", "CKMBINDEX", "TROPONINI" in the last 168 hours. BNP: BNP (last 3 results) No results for input(s): "BNP" in the last 8760 hours.  ProBNP (last 3 results) No results for input(s): "PROBNP" in the last 8760 hours.  CBG: No results for input(s): "GLUCAP" in the last 168 hours.     Signed:  Silvano Bilis MD.  [facility] 12/06/2022, 8:47 AM

## 2022-12-06 NOTE — Plan of Care (Signed)

## 2022-12-06 NOTE — Progress Notes (Signed)
Fistula sutures removed per order. Site WNL.

## 2022-12-06 NOTE — Progress Notes (Signed)
Mobility Specialist Progress Note:   12/06/22 1108  Mobility  Activity Ambulated with assistance in hallway  Level of Assistance Contact guard assist, steadying assist  Assistive Device None  Distance Ambulated (ft) 250 ft  Activity Response Tolerated well  Mobility Referral Yes  $Mobility charge 1 Mobility  Mobility Specialist Start Time (ACUTE ONLY) 1010  Mobility Specialist Stop Time (ACUTE ONLY) 1023  Mobility Specialist Time Calculation (min) (ACUTE ONLY) 13 min   Pt received sitting EOB w/ family member in room, agreeable to ambulate. Pt needed no physical assistance during session, just a contact guard d/t irregular gait. Pt asymptomatic during session, no SOB nor fatigue. Pt returned to room sitting EOB w/ call bell in hand.  Pre Mobility RA SPO2 98% During Mobility RA SPO2 93%-96%   Thompson Grayer Mobility Specialist  Please contact vis Secure Chat or  Rehab Office [phone]

## 2022-12-06 NOTE — Plan of Care (Signed)
[facility] Patient Discharge Orders - [facility] CLINIC: [facility]  Patient's name: Kathleen [name] Admit/DC Dates: 12/02/2022 - 12/06/2022  DISCHARGE DIAGNOSES: Clotted AVF -> s/p TDC placement and declot with IR   Hyperkalemia  HD ORDER CHANGES: Heparin change: no  EDW Change: yes New EDW: 50kg Bath Change: no  ANEMIA MANAGEMENT: Aranesp: Given: no  ESA dose for discharge: Mircera q 2 weeks - give 7/30 (this is a reduction) IV Iron dose at discharge: none Transfusion: Given: no  BONE/MINERAL MEDICATIONS: Hectorol/Calcitriol change: no Sensipar/Parsabiv change: no  ACCESS INTERVENTION/CHANGE: yes Details: TDC placed 7/25 at [facility] as outpatient -> underwent declot of AVG 7/26 with IR. Having pain with AVG s/p procedure so not used here (rested) - try to use AVG at that time if patient agreeable. If clots again soon, plan is for thigh AVG as outpatient.   RECENT LABS: Recent Labs  Lab 12/04/22 0231 12/06/22 0155  HGB 12.1 11.1*  NA 135 133*  K 4.4 3.9  CALCIUM 9.0 8.2*  PHOS 9.5*  --   ALBUMIN 2.9*  --     IV ANTIBIOTICS: no Details:  OTHER ANTICOAGULATION: On Coumadin?: no  OTHER/APPTS/LAB ORDERS:  - Needs f/u with [facility] as outpatient for PAD  D/C Meds to be reconciled by nurse after every discharge.  Completed By: Ozzie Hoyle, PA-C [facility] Pager [phone]   Reviewed by: MD:______ RN_______

## 2022-12-06 NOTE — Progress Notes (Addendum)
Progress Note  VASCULAR SURGERY ASSESSMENT & PLAN:   END-STAGE RENAL DISEASE: I have reviewed her ABIs.  They have dropped some compared to a previous study.  She says that she has some pain in her upper arm graft with her to use the catheter for now.  Looks like she is ready for discharge today.  I will arrange follow-up in our office in 4 to 6 weeks.  If her left upper arm graft is not working then I would recommend consideration for a thigh graft as this is her only remaining option.  However given her diminished toe pressures and evidence of infrainguinal arterial occlusive disease I think she would need preoperative arteriography before considering access.  She dialyzes on Tuesdays Thursdays and Saturdays and I will arrange follow-up in our office on a Monday Wednesday or Friday.  She is agreeable with this plan.  Cari Caraway, MD 9:30 AM    12/06/2022 7:17 AM Hospital Day 3  Subjective:  c/o pain over catheter and graft  afebrile  Vitals:   12/06/22 0015 12/06/22 0445  BP: 118/65 113/70  Pulse: 87 78  Resp: 20 19  Temp: 98 F (36.7 C) 98.4 F (36.9 C)  SpO2: 100% 94%    Physical Exam: General:  sitting on side of bed.  No distress Lungs:  non labored Extremities:  left radial pulse is palpable; graft with weak thrill; suture bolsters in place over graft  CBC    Component Value Date/Time   WBC 6.2 12/06/2022 0155   RBC 3.83 (L) 12/06/2022 0155   HGB 11.1 (L) 12/06/2022 0155   HCT 36.4 12/06/2022 0155   PLT 191 12/06/2022 0155   MCV 95.0 12/06/2022 0155   MCH 29.0 12/06/2022 0155   MCHC 30.5 12/06/2022 0155   RDW 16.4 (H) 12/06/2022 0155   LYMPHSABS 1.2 12/03/2022 0430   MONOABS 0.8 12/03/2022 0430   EOSABS 0.1 12/03/2022 0430   BASOSABS 0.0 12/03/2022 0430    BMET    Component Value Date/Time   NA 133 (L) 12/06/2022 0155   K 3.9 12/06/2022 0155   CL 92 (L) 12/06/2022 0155   CO2 25 12/06/2022 0155   GLUCOSE 88 12/06/2022 0155   BUN 37 (H) 12/06/2022  0155   CREATININE 9.98 (H) 12/06/2022 0155   CALCIUM 8.2 (L) 12/06/2022 0155   GFRNONAA 4 (L) 12/06/2022 0155   GFRAA 3 (L) 01/29/2020 0738    INR    Component Value Date/Time   INR 1.2 12/03/2022 1300    +-------+-----------+-----------+------------+-------------------------+  ABI/TBIToday's ABIToday's TBIPrevious ABIPrevious TBI               +-------+-----------+-----------+------------+-------------------------+  Right 0.89       absent     0.99        low amplitude of waveform  +-------+-----------+-----------+------------+-------------------------+  Left  0.84       absent     0.99        low amplitude of waveform  +-------+-----------+-----------+------------+-------------------------+     Assessment/Plan:  62 y.o. female with ESRD who recently underwent mechanical thrombectomy and angioplasty of an outflow stenosis in her redo left upper arm graft. 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 She is already had a redo upper arm graft on the left and is has no further options for revascularization on the left.  Hospital Day 3  -Dr. Edilia Bo feels dialysis unit should try to use graft in  left arm and if not possible, use TDC.  Her ABI's as above have dropped from previous study and absent toe pressure.  Dr. Edilia Bo to make further recommendations based on ABI.   -bolster removal per interventional radiology   Doreatha Massed, PA-C [facility] [phone] 12/06/2022 7:17 AM

## 2022-12-07 ENCOUNTER — Encounter (HOSPITAL_COMMUNITY): Payer: Self-pay

## 2022-12-08 NOTE — Telephone Encounter (Signed)
Appt has been scheduled.

## 2022-12-09 LAB — CULTURE, BLOOD (ROUTINE X 2)
Culture: NO GROWTH
Culture: NO GROWTH
Special Requests: ADEQUATE
Special Requests: ADEQUATE

## 2022-12-11 ENCOUNTER — Emergency Department (HOSPITAL_COMMUNITY): Payer: Medicare HMO

## 2022-12-11 ENCOUNTER — Inpatient Hospital Stay (HOSPITAL_COMMUNITY)
Admission: EM | Admit: 2022-12-11 | Discharge: 2022-12-15 | DRG: 314 | Disposition: A | Discharge status: Home / Self Care | Payer: Medicare HMO | Attending: Family Medicine | Admitting: Family Medicine

## 2022-12-11 ENCOUNTER — Other Ambulatory Visit: Payer: Self-pay

## 2022-12-11 DIAGNOSIS — D649 Anemia, unspecified: Secondary | ICD-10-CM | POA: Diagnosis present

## 2022-12-11 DIAGNOSIS — F17210 Nicotine dependence, cigarettes, uncomplicated: Secondary | ICD-10-CM | POA: Diagnosis present

## 2022-12-11 DIAGNOSIS — Y828 Other medical devices associated with adverse incidents: Secondary | ICD-10-CM | POA: Diagnosis present

## 2022-12-11 DIAGNOSIS — Z8673 Personal history of transient ischemic attack (TIA), and cerebral infarction without residual deficits: Secondary | ICD-10-CM

## 2022-12-11 DIAGNOSIS — Y848 Other medical procedures as the cause of abnormal reaction of the patient, or of later complication, without mention of misadventure at the time of the procedure: Secondary | ICD-10-CM | POA: Diagnosis present

## 2022-12-11 DIAGNOSIS — I120 Hypertensive chronic kidney disease with stage 5 chronic kidney disease or end stage renal disease: Secondary | ICD-10-CM | POA: Diagnosis present

## 2022-12-11 DIAGNOSIS — I739 Peripheral vascular disease, unspecified: Secondary | ICD-10-CM | POA: Diagnosis present

## 2022-12-11 DIAGNOSIS — J449 Chronic obstructive pulmonary disease, unspecified: Secondary | ICD-10-CM | POA: Diagnosis present

## 2022-12-11 DIAGNOSIS — Z803 Family history of malignant neoplasm of breast: Secondary | ICD-10-CM | POA: Diagnosis not present

## 2022-12-11 DIAGNOSIS — Z8041 Family history of malignant neoplasm of ovary: Secondary | ICD-10-CM | POA: Diagnosis not present

## 2022-12-11 DIAGNOSIS — Z992 Dependence on renal dialysis: Secondary | ICD-10-CM | POA: Diagnosis not present

## 2022-12-11 DIAGNOSIS — T827XXA Infection and inflammatory reaction due to other cardiac and vascular devices, implants and grafts, initial encounter: Secondary | ICD-10-CM | POA: Diagnosis present

## 2022-12-11 DIAGNOSIS — N2581 Secondary hyperparathyroidism of renal origin: Secondary | ICD-10-CM | POA: Diagnosis present

## 2022-12-11 DIAGNOSIS — N186 End stage renal disease: Secondary | ICD-10-CM | POA: Diagnosis present

## 2022-12-11 DIAGNOSIS — Z79899 Other long term (current) drug therapy: Secondary | ICD-10-CM

## 2022-12-11 DIAGNOSIS — M3214 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: Secondary | ICD-10-CM | POA: Diagnosis present

## 2022-12-11 DIAGNOSIS — M898X9 Other specified disorders of bone, unspecified site: Secondary | ICD-10-CM | POA: Diagnosis present

## 2022-12-11 DIAGNOSIS — Z808 Family history of malignant neoplasm of other organs or systems: Secondary | ICD-10-CM | POA: Diagnosis not present

## 2022-12-11 DIAGNOSIS — T827XXD Infection and inflammatory reaction due to other cardiac and vascular devices, implants and grafts, subsequent encounter: Secondary | ICD-10-CM | POA: Diagnosis not present

## 2022-12-11 LAB — CBC WITH DIFFERENTIAL/PLATELET
Abs Immature Granulocytes: 0.03 10*3/uL (ref 0.00–0.07)
Basophils Absolute: 0.1 10*3/uL (ref 0.0–0.1)
Basophils Relative: 1 %
Eosinophils Absolute: 0.2 10*3/uL (ref 0.0–0.5)
Eosinophils Relative: 3 %
HCT: 39.2 % (ref 36.0–46.0)
Hemoglobin: 11.4 g/dL — ABNORMAL LOW (ref 12.0–15.0)
Immature Granulocytes: 0 %
Lymphocytes Relative: 27 %
Lymphs Abs: 2 10*3/uL (ref 0.7–4.0)
MCH: 28.6 pg (ref 26.0–34.0)
MCHC: 29.1 g/dL — ABNORMAL LOW (ref 30.0–36.0)
MCV: 98.5 fL (ref 80.0–100.0)
Monocytes Absolute: 0.9 10*3/uL (ref 0.1–1.0)
Monocytes Relative: 12 %
Neutro Abs: 4 10*3/uL (ref 1.7–7.7)
Neutrophils Relative %: 57 %
Platelets: 314 10*3/uL (ref 150–400)
RBC: 3.98 MIL/uL (ref 3.87–5.11)
RDW: 17.1 % — ABNORMAL HIGH (ref 11.5–15.5)
WBC: 7.2 10*3/uL (ref 4.0–10.5)
nRBC: 0 % (ref 0.0–0.2)

## 2022-12-11 LAB — PROTIME-INR
INR: 1.1 (ref 0.8–1.2)
Prothrombin Time: 14.2 seconds (ref 11.4–15.2)

## 2022-12-11 LAB — CBC
HCT: 32.1 % — ABNORMAL LOW (ref 36.0–46.0)
Hemoglobin: 8.9 g/dL — ABNORMAL LOW (ref 12.0–15.0)
MCH: 28.5 pg (ref 26.0–34.0)
MCHC: 27.7 g/dL — ABNORMAL LOW (ref 30.0–36.0)
MCV: 102.9 fL — ABNORMAL HIGH (ref 80.0–100.0)
Platelets: 207 10*3/uL (ref 150–400)
RBC: 3.12 MIL/uL — ABNORMAL LOW (ref 3.87–5.11)
RDW: 17.5 % — ABNORMAL HIGH (ref 11.5–15.5)
WBC: 6 10*3/uL (ref 4.0–10.5)
nRBC: 0.3 % — ABNORMAL HIGH (ref 0.0–0.2)

## 2022-12-11 LAB — BASIC METABOLIC PANEL
Anion gap: 14 (ref 5–15)
BUN: 20 mg/dL (ref 8–23)
CO2: 19 mmol/L — ABNORMAL LOW (ref 22–32)
Calcium: 9.9 mg/dL (ref 8.9–10.3)
Chloride: 105 mmol/L (ref 98–111)
Creatinine, Ser: 10.89 mg/dL — ABNORMAL HIGH (ref 0.44–1.00)
GFR, Estimated: 4 mL/min — ABNORMAL LOW (ref >60)
Glucose, Bld: 88 mg/dL (ref 70–99)
Potassium: 4.6 mmol/L (ref 3.5–5.1)
Sodium: 138 mmol/L (ref 135–145)

## 2022-12-11 LAB — APTT: aPTT: 31 seconds (ref 24–36)

## 2022-12-11 LAB — I-STAT CG4 LACTIC ACID, ED
Lactic Acid, Venous: 1.9 mmol/L (ref 0.5–1.9)
Lactic Acid, Venous: 0.8 mmol/L (ref 0.5–1.9)

## 2022-12-11 LAB — CREATININE, SERUM
Creatinine, Ser: 9.84 mg/dL — ABNORMAL HIGH (ref 0.44–1.00)
GFR, Estimated: 4 mL/min — ABNORMAL LOW (ref >60)

## 2022-12-11 LAB — HEPATITIS B SURFACE ANTIGEN: Hepatitis B Surface Ag: NONREACTIVE

## 2022-12-11 MED ORDER — POLYETHYLENE GLYCOL 3350 17 G PO PACK: 17.0000 g | PACK | Freq: Every day | ORAL | Status: DC | PRN

## 2022-12-11 MED ORDER — VANCOMYCIN VARIABLE DOSE PER UNSTABLE RENAL FUNCTION (PHARMACIST DOSING): Status: DC

## 2022-12-11 MED ORDER — ACETAMINOPHEN 650 MG RE SUPP: 650.0000 mg | Freq: Four times a day (QID) | RECTAL | Status: DC | PRN

## 2022-12-11 MED ORDER — SODIUM CHLORIDE 0.9% FLUSH
3.0000 mL | Freq: Two times a day (BID) | INTRAVENOUS | Status: DC
  Administered 2022-12-12 – 2022-12-15 (×6): 3 mL via INTRAVENOUS

## 2022-12-11 MED ORDER — DOXERCALCIFEROL 4 MCG/2ML IV SOLN
2.0000 ug | INTRAVENOUS | Status: DC
  Administered 2022-12-14: 2 ug via INTRAVENOUS
  Filled 2022-12-11 (×2): qty 2

## 2022-12-11 MED ORDER — SEVELAMER CARBONATE 800 MG PO TABS
2400.0000 mg | ORAL_TABLET | Freq: Three times a day (TID) | ORAL | Status: DC
  Administered 2022-12-11: 2400 mg via ORAL
  Filled 2022-12-11: qty 3

## 2022-12-11 MED ORDER — CINACALCET HCL 30 MG PO TABS
60.0000 mg | ORAL_TABLET | ORAL | Status: DC
  Administered 2022-12-14: 60 mg via ORAL
  Filled 2022-12-11: qty 2

## 2022-12-11 MED ORDER — SEVELAMER CARBONATE 800 MG PO TABS
3200.0000 mg | ORAL_TABLET | Freq: Three times a day (TID) | ORAL | Status: DC
  Administered 2022-12-12: 3200 mg via ORAL
  Filled 2022-12-11: qty 4

## 2022-12-11 MED ORDER — HEPARIN SODIUM (PORCINE) 5000 UNIT/ML IJ SOLN
5000.0000 [IU] | Freq: Three times a day (TID) | INTRAMUSCULAR | Status: DC
  Administered 2022-12-11 – 2022-12-15 (×11): 5000 [IU] via SUBCUTANEOUS
  Filled 2022-12-11 (×12): qty 1

## 2022-12-11 MED ORDER — CINACALCET HCL 30 MG PO TABS: 60.0000 mg | ORAL_TABLET | ORAL | Status: DC

## 2022-12-11 MED ORDER — ACETAMINOPHEN 325 MG PO TABS: 650.0000 mg | ORAL_TABLET | Freq: Four times a day (QID) | ORAL | Status: DC | PRN

## 2022-12-11 MED ORDER — SODIUM CHLORIDE 0.9 % IV SOLN
2.0000 g | Freq: Once | INTRAVENOUS | Status: AC
  Administered 2022-12-11: 2 g via INTRAVENOUS
  Filled 2022-12-11: qty 12.5

## 2022-12-11 MED ORDER — CHLORHEXIDINE GLUCONATE CLOTH 2 % EX PADS
6.0000 | MEDICATED_PAD | Freq: Every day | CUTANEOUS | Status: DC
  Administered 2022-12-12 – 2022-12-15 (×4): 6 via TOPICAL

## 2022-12-11 MED ORDER — VANCOMYCIN HCL IN DEXTROSE 1-5 GM/200ML-% IV SOLN
1000.0000 mg | Freq: Once | INTRAVENOUS | Status: AC
  Administered 2022-12-11: 1000 mg via INTRAVENOUS
  Filled 2022-12-11: qty 200

## 2022-12-11 MED ORDER — PANTOPRAZOLE SODIUM 40 MG PO TBEC
40.0000 mg | DELAYED_RELEASE_TABLET | Freq: Two times a day (BID) | ORAL | Status: DC
  Administered 2022-12-12 – 2022-12-15 (×8): 40 mg via ORAL
  Filled 2022-12-11 (×8): qty 1

## 2022-12-11 MED ORDER — SEVELAMER CARBONATE 800 MG PO TABS: 800.0000 mg | ORAL_TABLET | ORAL | Status: DC | PRN

## 2022-12-11 NOTE — Consult Note (Addendum)
[name] Renal Consultation Note    Indication for Consultation:  Management of ESRD/hemodialysis; anemia, hypertension/volume and secondary hyperparathyroidism PCP:  HPI: Kathleen [name] is a 62 y.o. 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 She has dialysis T,Th,S at [facility]. Last HD 12/09/2022. She ran full treatment left at EDW. Recent admission to [facility] 07/25-07/29/2024 for malfunctioning AVG. Current TDC was placed 12/02/2022 prior to admission. She was seen by Dr. Edilia Bo while in-patient. Plan was to F/U with VVS if AVG did not function properly for new access.   Patient seen in room. She is alert, oriented. She does not appear toxic. Patient tells me that she presented to HD unit this AM and there was blood and purulent drainage on [facility] dressing from exit site of TDC. She also complains of pain around and above [facility] exit but no fevers, chills, malaise. She was sent to ED from HD unit without receiving HD. Labs 12/11/2022: SCr 10.9 BUN 20 CO2 19 K+ 4.6. WBC 6.0 HGB 8.9 PLT 207. CXR unremarkable.  [facility] Exit site culture and [facility] are pending. She was started on  empiric ABX per primary and admitted with unconfirmed TDC exit site infection.  Seen in room. TDC examined. There is no drainage, erythema at or around [facility] exit site. Area is tender to palpation. Repeat exit site culture done. HD nurse called to bedside to replace [facility] dressing per sterile technique. She denies SOB/Chest pain. Can lie flat comfortably however will order HD for tonight. She has AVG + T/B. Will attempt to use AVG with HD tonight. Await culture results. If AVG functions appropriately, we will proceed with removal of TDC.   Past Medical History:  Diagnosis Date   Anemia    Arthritis    Cardiac arrest (HCC)    Cellulitis    Cerebral vasculitis    COPD (chronic obstructive pulmonary disease) (HCC)    Elevated antinuclear antibody (ANA) level    Endometriosis    ESRD (end stage renal  disease) on dialysis (HCC)    Gallstones    Hypertension    Lupus (HCC)    Migraine    PONV (postoperative nausea and vomiting)    Seizures (HCC)    Sepsis (HCC)    Sleep apnea    Suicide attempt (HCC)    TB (tuberculosis)    Transient ischemic attack (TIA)    Vasculitis of skin    Past Surgical History:  Procedure Laterality Date   AV FISTULA PLACEMENT Left 07/04/2017   Procedure: ARTERIOVENOUS (AV) FISTULA CREATION;  Surgeon: Fransisco Hertz, MD;  Location: [facility] OR;  Service: Vascular;  Laterality: Left;   AV FISTULA PLACEMENT Left 11/27/2019   Procedure: INSERTION OF ARTERIOVENOUS (AV) GORE-TEX GRAFT LEFT UPPER ARM;  Surgeon: Chuck Hint, MD;  Location: [facility] OR;  Service: Vascular;  Laterality: Left;   AV FISTULA PLACEMENT Left 07/29/2020   Procedure: INSERTION OF LEFT UPPER ARM ARTERIOVENOUS (AV) GORE-TEX GRAFT;  Surgeon: Chuck Hint, MD;  Location: [facility] OR;  Service: Vascular;  Laterality: Left;   BIOPSY  12/23/2021   Procedure: BIOPSY;  Surgeon: Tressia Danas, MD;  Location: [facility] ENDOSCOPY;  Service: Gastroenterology;;   BUBBLE STUDY  05/25/2021   Procedure: BUBBLE STUDY;  Surgeon: Sande Rives, MD;  Location: [facility] ENDOSCOPY;  Service: Cardiovascular;;   CESAREAN SECTION     COLONOSCOPY WITH PROPOFOL N/A 12/23/2021   Procedure: COLONOSCOPY WITH PROPOFOL;  Surgeon: Tressia Danas, MD;  Location: [facility] ENDOSCOPY;  Service: Gastroenterology;  Laterality: N/A;   ESOPHAGOGASTRODUODENOSCOPY N/A 07/15/2016   Procedure: ESOPHAGOGASTRODUODENOSCOPY (EGD);  Surgeon: Carman Ching, MD;  Location: [facility] ENDOSCOPY;  Service: Endoscopy;  Laterality: N/A;   ESOPHAGOGASTRODUODENOSCOPY N/A 12/23/2021   Procedure: ESOPHAGOGASTRODUODENOSCOPY (EGD);  Surgeon: Tressia Danas, MD;  Location: [facility] ENDOSCOPY;  Service: Gastroenterology;  Laterality: N/A;   I & D EXTREMITY Left 05/27/2021   Procedure: IRRIGATION AND DEBRIDEMENT OF WRIST;  Surgeon: Bradly Bienenstock, MD;  Location: [facility] OR;   Service: Orthopedics;  Laterality: Left;   INSERTION OF DIALYSIS CATHETER Right 07/04/2017   Procedure: INSERTION OF TUNNELED  DIALYSIS CATHETER - RIGHT INTERNAL JUGULAR PLACEMENT;  Surgeon: Fransisco Hertz, MD;  Location: [facility] OR;  Service: Vascular;  Laterality: Right;   IR FLUORO GUIDE CV LINE RIGHT  09/08/2021   IR THROMBECTOMY AV FISTULA W/THROMBOLYSIS/PTA INC/SHUNT/IMG LEFT Left 09/10/2021   IR THROMBECTOMY AV FISTULA W/THROMBOLYSIS/PTA INC/SHUNT/IMG LEFT Left 12/03/2022   IR US GUIDE VASC ACCESS LEFT  09/10/2021   IR US GUIDE VASC ACCESS LEFT  12/03/2022   IR US GUIDE VASC ACCESS RIGHT  09/08/2021   LUNG BIOPSY     POLYPECTOMY  12/23/2021   Procedure: POLYPECTOMY;  Surgeon: Tressia Danas, MD;  Location: [facility] ENDOSCOPY;  Service: Gastroenterology;;   TEE WITHOUT CARDIOVERSION N/A 05/25/2021   Procedure: TRANSESOPHAGEAL ECHOCARDIOGRAM (TEE);  Surgeon: Sande Rives, MD;  Location: [facility] ENDOSCOPY;  Service: Cardiovascular;  Laterality: N/A;   TUBAL LIGATION     Family History  Problem Relation Age of Onset   Ovarian cancer Mother    Breast cancer Sister    Throat cancer Brother    Social History:  reports that she has been smoking cigarettes. She has never used smokeless tobacco. She reports that she does not drink alcohol and does not use drugs. No Known Allergies Prior to Admission medications   Medication Sig Start Date End Date Taking? Authorizing Provider  acetaminophen (TYLENOL) 160 MG/5ML liquid Take 320 mg by mouth every 6 (six) hours as needed for fever or pain.   Yes [provider]  cinacalcet (SENSIPAR) 30 MG tablet Take 2 tablets (60 mg total) by mouth every Tuesday, Thursday, and Saturday at 6 PM. 12/07/22  Yes Wouk, Wilfred Curtis, MD  ferrous sulfate 325 (65 FE) MG tablet Take 325 mg by mouth daily.   Yes [provider]  multivitamin (RENA-VIT) TABS tablet Take 1 tablet by mouth daily.   Yes [provider]  pantoprazole (PROTONIX) 40 MG tablet Take  1 tablet (40 mg total) by mouth 2 (two) times daily. 10/30/22 01/15/23 Yes [name], [name], MD  sevelamer carbonate (RENVELA) 800 MG tablet Take 800-2,400 mg by mouth See admin instructions. 2400 mg three times daily with meals, 800 mg with snacks. 08/28/21  Yes [provider]   Current Facility-Administered Medications  Medication Dose Route Frequency Provider Last Rate Last Admin   acetaminophen (TYLENOL) tablet 650 mg  650 mg Oral Q6H PRN Nolberto Hanlon, MD       Or   acetaminophen (TYLENOL) suppository 650 mg  650 mg Rectal Q6H PRN Nolberto Hanlon, MD       cinacalcet (SENSIPAR) tablet 60 mg  60 mg Oral Q T,Th,Sat-1800 Nolberto Hanlon, MD       heparin injection 5,000 Units  5,000 Units Subcutaneous Q8H Nolberto Hanlon, MD       pantoprazole (PROTONIX) EC tablet 40 mg  40 mg Oral BID Nolberto Hanlon, MD       polyethylene glycol (MIRALAX / GLYCOLAX) packet 17 g  17 g Oral Daily PRN Nolberto Hanlon, MD       sevelamer carbonate (RENVELA) tablet 2,400 mg  2,400 mg Oral TID WC Nolberto Hanlon, MD       sevelamer carbonate (RENVELA) tablet 800 mg  800 mg Oral PRN Nolberto Hanlon, MD       sodium chloride flush (NS) 0.9 % injection 3 mL  3 mL Intravenous Q12H Nolberto Hanlon, MD       vancomycin variable dose per unstable renal function (pharmacist dosing)   Does not apply See admin instructions Nolberto Hanlon, MD       Labs: Basic Metabolic Panel: Recent Labs  Lab 12/06/22 0155 12/11/22 1115 12/11/22 1602  NA 133* 138  --   K 3.9 4.6  --   CL 92* 105  --   CO2 25 19*  --   GLUCOSE 88 88  --   BUN 37* 20  --   CREATININE 9.98* 10.89* 9.84*  CALCIUM 8.2* 9.9  --    Liver Function Tests: No results for input(s): "AST", "ALT", "ALKPHOS", "BILITOT", "PROT", "ALBUMIN" in the last 168 hours. No results for input(s): "LIPASE", "AMYLASE" in the last 168 hours. No results for input(s): "AMMONIA" in the last 168 hours. CBC: Recent Labs  Lab 12/06/22 0155 12/11/22 1115 12/11/22 1602  WBC 6.2 7.2 6.0  NEUTROABS  --  4.0   --   HGB 11.1* 11.4* 8.9*  HCT 36.4 39.2 32.1*  MCV 95.0 98.5 102.9*  PLT 191 314 207   Cardiac Enzymes: No results for input(s): "CKTOTAL", "CKMB", "CKMBINDEX", "TROPONINI" in the last 168 hours. CBG: No results for input(s): "GLUCAP" in the last 168 hours. Iron Studies: No results for input(s): "IRON", "TIBC", "TRANSFERRIN", "FERRITIN" in the last 72 hours. Studies/Results: DG Chest Port 1 View  Result Date: 12/11/2022 CLINICAL DATA:  Questionable sepsis - evaluate for abnormality EXAM: PORTABLE CHEST 1 VIEW COMPARISON:  CXR 10/27/22 FINDINGS: No pleural effusion. No pneumothorax. Right-sided central venous catheter with tip in the right atrium. Normal cardiac and mediastinal contours. No focal airspace opacity. No radiographically apparent displaced rib fractures. Visualized upper abdomen is unremarkable. IMPRESSION: No acute cardiopulmonary process. Electronically Signed   By: Lorenza Cambridge M.D.   On: 12/11/2022 12:42    ROS: As per HPI otherwise negative.  Physical Exam: Vitals:   12/11/22 1200 12/11/22 1338 12/11/22 1638 12/11/22 1647  BP: 100/69 107/72 123/84 116/84  Pulse: 78 76 72 89  Resp: 18 17 16 18   Temp: 98.3 F (36.8 C) 98.3 F (36.8 C) (!) 97.5 F (36.4 C) 98.2 F (36.8 C)  TempSrc: Oral Oral Oral Oral  SpO2: (!) 88% 96% 98% 98%     General: Very pleasant older female in no acute distress. Head: Normocephalic, atraumatic, sclera non-icteric, mucus membranes are moist Neck: Supple. Jugular veins are prominent but JVD not elevated. Lungs: Clear bilaterally to auscultation without wheezes, rales, or rhonchi. Breathing is unlabored. Heart: RRR with S1 S2. No murmurs, rubs, or gallops appreciated. Abdomen: Soft, non-tender, non-distended with normoactive bowel sounds. No rebound/guarding. No obvious abdominal masses. M-S:  Strength and tone appear normal for age. Lower extremities:without edema or ischemic changes, no open wounds  Neuro: Alert and oriented X 3. Moves  all extremities spontaneously. Psych:  Responds to questions appropriately with a normal affect. Dialysis Access:L AVG + T/B. RIJ TDC new dressing placed.   Dialysis Orders: [place], T,Th,S 3.5 hrs 160NRe 350/Autoflow 1.5 50.3 kg 2.0 K/2.0 Ca AVG/TDC - Heparin 3600 units  IV three times per week - Hectorol 3 mcg IV three times per week - Cinacalcet 60 mg PO three times per week - Mircera 100 mcg IV q 2 weeks (last dose 12/07/2022) - Venofer 50 mg IV weekly   Assessment/Plan:  Possible TDC exit site infection: BC/WC pending. Empiric ABX started per primary. Awaiting results. Pt is afebrile, does not appear toxic.   ESRD -  Missed HD today because she was sent to ED instead. Will have HD later tonight. Orders given to use AVG. IF AVG FUNCTIONS APPROPRIATELY, WE WILL PULL TDC AND NOT REPLACE.   Hypertension/volume  - BP controlled. No evidence of volume excess by exam or CXR. UF as tolerated.   Anemia  - HGB 8.9. Recent ESA dose 12/07/2022. Hold venofer in setting of possible infection. Follow HGB.   Metabolic bone disease -  Last PO4 9.5 while in hospital Corrected calcium high. Decrease VDRA dose. Continue high dose renvela binders.   Nutrition - Last Albumin 2.9 12/04/2022. Add protein supplements.    H. Manson Passey, NP-C 12/11/2022, 5:00 PM  [employer] [phone]

## 2022-12-11 NOTE — Progress Notes (Signed)
Pharmacy Antibiotic Note  Kathleen [name] is a 62 y.o. female for which pharmacy has been consulted for vancomycin dosing for  HD catheter site infection .  Patient with a history of ESRD on HD w/ rt chest wall permacath (placed ~1 week ago). HD RN noted purulent drainage from cath today at HD.  Plan: Cefepime 2g given once in ED Vancomycin 1g given in ED - repeat dosing pending HD schedule - pharmacy to follow Monitor WBC, fever, renal function, cultures De-escalate when able F/u Nephrology plan     Temp (24hrs), Avg:98.4 F (36.9 C), Min:98.3 F (36.8 C), Max:98.6 F (37 C)  Recent Labs  Lab 12/06/22 0155 12/11/22 1115 12/11/22 1239  WBC 6.2 7.2  --   CREATININE 9.98* 10.89*  --   LATICACIDVEN  --   --  1.9    Estimated Creatinine Clearance: 4.1 mL/min (A) (by C-G formula based on SCr of 10.89 mg/dL (H)).    No Known Allergies  Microbiology results: Pending  Thank you for allowing pharmacy to be a part of this patient's care.  Delmar Landau, PharmD, BCPS 12/11/2022 3:57 PM ED Clinical Pharmacist -  [phone]

## 2022-12-11 NOTE — Assessment & Plan Note (Signed)
Chronic ESRD patient on Tuesday Thursday Saturday dialysis.  Patient did not receive dialysis today.  Labs not suggestive of need for emergent dialysis.  I have engaged Dr. Signe Colt of nephrology to help.  Patient has newly noted exit site infection of the hemodialysis catheter in the right subclavian.  At this time no suspicion of bloodstream infection.  Wound culture could not be done at the site because the purulence is so scant.  Blood cultures are pending.  Patient has received vancomycin and cefepime in the ER.  I will continue with prophylactic vancomycin with intention to prevent development of bloodstream infection.  I anticipate comanagement with nephrology and IR to get this catheter removed and have a new catheter placed at distant site for hemodialysis

## 2022-12-11 NOTE — Plan of Care (Signed)

## 2022-12-11 NOTE — ED Notes (Signed)
ED TO INPATIENT HANDOFF REPORT  ED Nurse Name and Phone #: [phone]    S Name/Age/Gender Kathleen [name] 62 y.o. female Room/Bed: H013C/H013C  Code Status   Code Status: Full Code  Home/SNF/Other Home Patient oriented to: self, place, time, and situation Is this baseline? Yes   Triage Complete: Triage complete  Chief Complaint Hemodialysis catheter infection (HCC) [T82.7XXA]  Triage Note Pt. Stated, My dialysis access is having pus and blood from it for a couple of days. (Subclavian)Also my left arm is so sore.   Allergies No Known Allergies  Level of Care/Admitting Diagnosis ED Disposition     ED Disposition  Admit   Condition  --   Comment  Hospital Area: [facility] [[id number]]  Level of Care: Med-Surg [16]  May admit patient to [facility] or [facility] if equivalent level of care is available:: No  Covid Evaluation: Asymptomatic - no recent exposure (last 10 days) testing not required  Diagnosis: Hemodialysis catheter infection [facility]) [[id number]]  Admitting Physician: Nolberto Hanlon [[id number]]  Attending Physician: Nolberto Hanlon [[id number]]  Certification:: I certify this patient will need inpatient services for at least 2 midnights  Estimated Length of Stay: 3          B Medical/Surgery History Past Medical History:  Diagnosis Date   Anemia    Arthritis    Cardiac arrest (HCC)    Cellulitis    Cerebral vasculitis    COPD (chronic obstructive pulmonary disease) (HCC)    Elevated antinuclear antibody (ANA) level    Endometriosis    ESRD (end stage renal disease) on dialysis (HCC)    Gallstones    Hypertension    Lupus (HCC)    Migraine    PONV (postoperative nausea and vomiting)    Seizures (HCC)    Sepsis (HCC)    Sleep apnea    Suicide attempt (HCC)    TB (tuberculosis)    Transient ischemic attack (TIA)    Vasculitis of skin    Past Surgical History:  Procedure Laterality Date   AV FISTULA PLACEMENT Left 07/04/2017    Procedure: ARTERIOVENOUS (AV) FISTULA CREATION;  Surgeon: Fransisco Hertz, MD;  Location: [facility] OR;  Service: Vascular;  Laterality: Left;   AV FISTULA PLACEMENT Left 11/27/2019   Procedure: INSERTION OF ARTERIOVENOUS (AV) GORE-TEX GRAFT LEFT UPPER ARM;  Surgeon: Chuck Hint, MD;  Location: [facility] OR;  Service: Vascular;  Laterality: Left;   AV FISTULA PLACEMENT Left 07/29/2020   Procedure: INSERTION OF LEFT UPPER ARM ARTERIOVENOUS (AV) GORE-TEX GRAFT;  Surgeon: Chuck Hint, MD;  Location: [facility] OR;  Service: Vascular;  Laterality: Left;   BIOPSY  12/23/2021   Procedure: BIOPSY;  Surgeon: Tressia Danas, MD;  Location: [facility] ENDOSCOPY;  Service: Gastroenterology;;   BUBBLE STUDY  05/25/2021   Procedure: BUBBLE STUDY;  Surgeon: Sande Rives, MD;  Location: [facility] ENDOSCOPY;  Service: Cardiovascular;;   CESAREAN SECTION     COLONOSCOPY WITH PROPOFOL N/A 12/23/2021   Procedure: COLONOSCOPY WITH PROPOFOL;  Surgeon: Tressia Danas, MD;  Location: [facility] ENDOSCOPY;  Service: Gastroenterology;  Laterality: N/A;   ESOPHAGOGASTRODUODENOSCOPY N/A 07/15/2016   Procedure: ESOPHAGOGASTRODUODENOSCOPY (EGD);  Surgeon: Carman Ching, MD;  Location: [facility] ENDOSCOPY;  Service: Endoscopy;  Laterality: N/A;   ESOPHAGOGASTRODUODENOSCOPY N/A 12/23/2021   Procedure: ESOPHAGOGASTRODUODENOSCOPY (EGD);  Surgeon: Tressia Danas, MD;  Location: [facility] ENDOSCOPY;  Service: Gastroenterology;  Laterality: N/A;   I & D EXTREMITY Left 05/27/2021   Procedure: IRRIGATION AND DEBRIDEMENT OF WRIST;  Surgeon: Bradly Bienenstock, MD;  Location: [facility] OR;  Service: Orthopedics;  Laterality: Left;   INSERTION OF DIALYSIS CATHETER Right 07/04/2017   Procedure: INSERTION OF TUNNELED  DIALYSIS CATHETER - RIGHT INTERNAL JUGULAR PLACEMENT;  Surgeon: Fransisco Hertz, MD;  Location: [facility] OR;  Service: Vascular;  Laterality: Right;   IR FLUORO GUIDE CV LINE RIGHT  09/08/2021   IR THROMBECTOMY AV FISTULA W/THROMBOLYSIS/PTA INC/SHUNT/IMG LEFT Left 09/10/2021    IR THROMBECTOMY AV FISTULA W/THROMBOLYSIS/PTA INC/SHUNT/IMG LEFT Left 12/03/2022   IR US GUIDE VASC ACCESS LEFT  09/10/2021   IR US GUIDE VASC ACCESS LEFT  12/03/2022   IR US GUIDE VASC ACCESS RIGHT  09/08/2021   LUNG BIOPSY     POLYPECTOMY  12/23/2021   Procedure: POLYPECTOMY;  Surgeon: Tressia Danas, MD;  Location: [facility] ENDOSCOPY;  Service: Gastroenterology;;   TEE WITHOUT CARDIOVERSION N/A 05/25/2021   Procedure: TRANSESOPHAGEAL ECHOCARDIOGRAM (TEE);  Surgeon: Sande Rives, MD;  Location: [facility] ENDOSCOPY;  Service: Cardiovascular;  Laterality: N/A;   TUBAL LIGATION       A IV Location/Drains/Wounds Patient Lines/Drains/Airways Status     Active Line/Drains/Airways     Name Placement date Placement time Site Days   Peripheral IV 12/11/22 22 G Anterior;Proximal;Right Forearm 12/11/22  1240  Forearm  less than 1   Fistula / Graft Left Upper arm Arteriovenous vein graft 05/16/21  1535  Upper arm  574   Fistula / Graft Left 12/24/21  1000  --  352   Hemodialysis Catheter Right Subclavian --  --  Subclavian  --   Wound / Incision (Open or Dehisced) 05/16/21 (MASD) Moisture Associated Skin Damage Thigh Anterior;Proximal;Right 05/16/21  1540  Thigh  574   Wound / Incision (Open or Dehisced) 05/16/21 (MASD) Moisture Associated Skin Damage Buttocks Right;Left 05/16/21  1530  Buttocks  574            Intake/Output Last 24 hours No intake or output data in the 24 hours ending 12/11/22 1605  Labs/Imaging Results for orders placed or performed during the hospital encounter of 12/11/22 (from the past 48 hour(s))  CBC with Differential     Status: Abnormal   Collection Time: 12/11/22 11:15 AM  Result Value Ref Range   WBC 7.2 4.0 - 10.5 K/uL   RBC 3.98 3.87 - 5.11 MIL/uL   Hemoglobin 11.4 (L) 12.0 - 15.0 g/dL   HCT 02.7 25.3 - 66.4 %   MCV 98.5 80.0 - 100.0 fL   MCH 28.6 26.0 - 34.0 pg   MCHC 29.1 (L) 30.0 - 36.0 g/dL   RDW 40.3 (H) 47.4 - 25.9 %   Platelets 314 150 - 400 K/uL    nRBC 0.0 0.0 - 0.2 %   Neutrophils Relative % 57 %   Neutro Abs 4.0 1.7 - 7.7 K/uL   Lymphocytes Relative 27 %   Lymphs Abs 2.0 0.7 - 4.0 K/uL   Monocytes Relative 12 %   Monocytes Absolute 0.9 0.1 - 1.0 K/uL   Eosinophils Relative 3 %   Eosinophils Absolute 0.2 0.0 - 0.5 K/uL   Basophils Relative 1 %   Basophils Absolute 0.1 0.0 - 0.1 K/uL   Immature Granulocytes 0 %   Abs Immature Granulocytes 0.03 0.00 - 0.07 K/uL    Comment: Performed at [facility], [address], [place], [place] [address]  Basic metabolic panel     Status: Abnormal   Collection Time: 12/11/22 11:15 AM  Result Value Ref Range   Sodium 138  135 - 145 mmol/L   Potassium 4.6 3.5 - 5.1 mmol/L   Chloride 105 98 - 111 mmol/L   CO2 19 (L) 22 - 32 mmol/L   Glucose, Bld 88 70 - 99 mg/dL    Comment: Glucose reference range applies only to samples taken after fasting for at least 8 hours.   BUN 20 8 - 23 mg/dL   Creatinine, Ser 40.98 (H) 0.44 - 1.00 mg/dL   Calcium 9.9 8.9 - 11.9 mg/dL   GFR, Estimated 4 (L) >60 mL/min    Comment: (NOTE) Calculated using the CKD-EPI Creatinine Equation (2021)    Anion gap 14 5 - 15    Comment: Performed at [facility], [address], [place], [place] [address]  Protime-INR     Status: None   Collection Time: 12/11/22 12:31 PM  Result Value Ref Range   Prothrombin Time 14.2 11.4 - 15.2 seconds   INR 1.1 0.8 - 1.2    Comment: (NOTE) INR goal varies based on device and disease states. Performed at [facility], [address] [place], [place] [address]   APTT     Status: None   Collection Time: 12/11/22 12:31 PM  Result Value Ref Range   aPTT 31 24 - 36 seconds    Comment: Performed at [facility], [address], [place], [place] [address]  I-Stat Lactic Acid, ED     Status: None   Collection Time: 12/11/22 12:39 PM  Result Value Ref Range   Lactic Acid, Venous 1.9 0.5 - 1.9 mmol/L   DG Chest Port 1 View  Result Date: 12/11/2022 CLINICAL  DATA:  Questionable sepsis - evaluate for abnormality EXAM: PORTABLE CHEST 1 VIEW COMPARISON:  CXR 10/27/22 FINDINGS: No pleural effusion. No pneumothorax. Right-sided central venous catheter with tip in the right atrium. Normal cardiac and mediastinal contours. No focal airspace opacity. No radiographically apparent displaced rib fractures. Visualized upper abdomen is unremarkable. IMPRESSION: No acute cardiopulmonary process. Electronically Signed   By: Lorenza Cambridge M.D.   On: 12/11/2022 12:42    Pending Labs Unresulted Labs (From admission, onward)     Start     Ordered   12/12/22 0500  APTT  Tomorrow morning,   R        12/11/22 1525   12/12/22 0500  Protime-INR  Tomorrow morning,   R        12/11/22 1525   12/12/22 0500  Basic metabolic panel  Tomorrow morning,   R        12/11/22 1525   12/12/22 0500  CBC  Tomorrow morning,   R        12/11/22 1525   12/12/22 0500  Hepatic function panel  Tomorrow morning,   R        12/11/22 1553   12/12/22 0500  Phosphorus  Tomorrow morning,   R        12/11/22 1558   12/11/22 1525  CBC  (heparin)  Once,   R       Comments: Baseline for heparin therapy IF NOT ALREADY DRAWN.  Notify MD if PLT < 100 K.    12/11/22 1525   12/11/22 1525  Creatinine, serum  (heparin)  Once,   R       Comments: Baseline for heparin therapy IF NOT ALREADY DRAWN.    12/11/22 1525   12/11/22 1357  Aerobic Culture w Gram Stain (superficial specimen)  ONCE - URGENT,   URGENT  Comments: Catheter site    12/11/22 1356   12/11/22 1205  Blood Culture (routine x 2)  (Undifferentiated presentation (screening labs and basic nursing orders))  BLOOD CULTURE X 2,   STAT      12/11/22 1205            Vitals/Pain Today's Vitals   12/11/22 1110 12/11/22 1115 12/11/22 1200 12/11/22 1338  BP:  (!) 122/91 100/69 107/72  Pulse:  89 78 76  Resp:  18 18 17   Temp:  98.6 F (37 C) 98.3 F (36.8 C) 98.3 F (36.8 C)  TempSrc:   Oral Oral  SpO2:  100% (!) 88% 96%   PainSc: 10-Worst pain ever       Isolation Precautions No active isolations  Medications Medications  acetaminophen (TYLENOL) tablet 650 mg (has no administration in time range)    Or  acetaminophen (TYLENOL) suppository 650 mg (has no administration in time range)  polyethylene glycol (MIRALAX / GLYCOLAX) packet 17 g (has no administration in time range)  heparin injection 5,000 Units (has no administration in time range)  sodium chloride flush (NS) 0.9 % injection 3 mL (has no administration in time range)  vancomycin variable dose per unstable renal function (pharmacist dosing) (has no administration in time range)  cinacalcet (SENSIPAR) tablet 60 mg (has no administration in time range)  pantoprazole (PROTONIX) EC tablet 40 mg (has no administration in time range)  sevelamer carbonate (RENVELA) tablet 2,400 mg (has no administration in time range)  sevelamer carbonate (RENVELA) tablet 800 mg (has no administration in time range)  ceFEPIme (MAXIPIME) 2 g in sodium chloride 0.9 % 100 mL IVPB (0 g Intravenous Stopped 12/11/22 1432)  vancomycin (VANCOCIN) IVPB 1000 mg/200 mL premix (0 mg Intravenous Stopped 12/11/22 1602)    Mobility walks     Focused Assessments     R Recommendations: See Admitting Provider Note  Report given to:   Additional Notes:

## 2022-12-11 NOTE — Progress Notes (Signed)
ED Pharmacy Antibiotic Sign Off An antibiotic consult was received from an ED provider for cefepime and vancomycin per pharmacy dosing for  line infection . A chart review was completed to assess appropriateness.  The following one time order(s) were placed per pharmacy consult:  cefepime 2000 mg x 1 dose vancomycin 1000 mg x 1 dose  Further antibiotic and/or antibiotic pharmacy consults should be ordered by the admitting provider if indicated.   Thank you for allowing pharmacy to be a part of this patient's care.   Delmar Landau, PharmD, BCPS 12/11/2022 1:39 PM ED Clinical Pharmacist -  [phone]

## 2022-12-11 NOTE — ED Triage Notes (Signed)
Pt. Stated, My dialysis access is having pus and blood from it for a couple of days. (Subclavian)Also my left arm is so sore.

## 2022-12-11 NOTE — H&P (Signed)
History and Physical    Patient: Kathleen [name] RUE:[id number] DOB: 1960/06/19 DOA: 12/11/2022 DOS: the patient was seen and examined on 12/11/2022 PCP: Hildred Priest, NP  Patient coming from:  hemodialysis center.  Chief Complaint:  Chief Complaint  Patient presents with   Vascular Access Problem   left arm pain   HPI: Kathleen [name] is a 62 y.o. female with medical history significant of "lupus "complicated by end-stage renal disease.  Patient is on dialysis Tuesday Thursday Saturday.  Patient last received her outpatient dialysis on Thursday, 2 days ago, and had her dressing change done of the hemodialysis catheter that is tunneled in the right subclavian area.  Apparently there was no abnormality noted.  However since then patient has noted increasing pain and tenderness at the site.  When the dressing change was done today at dialysis center, there was purulence noted on the dressing.  Patient is sent to the ER.  There is no nausea vomiting diarrhea or rigors or fever reported.  Here in the ER, a culture at the site cannot be done because the purulence is not noted since then.  Patient has been started on empiric antibiotics medical evaluation is sought  Review of Systems: As mentioned in the history of present illness. All other systems reviewed and are negative. Past Medical History:  Diagnosis Date   Anemia    Arthritis    Cardiac arrest (HCC)    Cellulitis    Cerebral vasculitis    COPD (chronic obstructive pulmonary disease) (HCC)    Elevated antinuclear antibody (ANA) level    Endometriosis    ESRD (end stage renal disease) on dialysis (HCC)    Gallstones    Hypertension    Lupus (HCC)    Migraine    PONV (postoperative nausea and vomiting)    Seizures (HCC)    Sepsis (HCC)    Sleep apnea    Suicide attempt (HCC)    TB (tuberculosis)    Transient ischemic attack (TIA)    Vasculitis of skin    Past Surgical History:  Procedure Laterality Date   AV FISTULA  PLACEMENT Left 07/04/2017   Procedure: ARTERIOVENOUS (AV) FISTULA CREATION;  Surgeon: Fransisco Hertz, MD;  Location: [facility] OR;  Service: Vascular;  Laterality: Left;   AV FISTULA PLACEMENT Left 11/27/2019   Procedure: INSERTION OF ARTERIOVENOUS (AV) GORE-TEX GRAFT LEFT UPPER ARM;  Surgeon: Chuck Hint, MD;  Location: [facility] OR;  Service: Vascular;  Laterality: Left;   AV FISTULA PLACEMENT Left 07/29/2020   Procedure: INSERTION OF LEFT UPPER ARM ARTERIOVENOUS (AV) GORE-TEX GRAFT;  Surgeon: Chuck Hint, MD;  Location: [facility] OR;  Service: Vascular;  Laterality: Left;   BIOPSY  12/23/2021   Procedure: BIOPSY;  Surgeon: Tressia Danas, MD;  Location: [facility] ENDOSCOPY;  Service: Gastroenterology;;   BUBBLE STUDY  05/25/2021   Procedure: BUBBLE STUDY;  Surgeon: Sande Rives, MD;  Location: [facility] ENDOSCOPY;  Service: Cardiovascular;;   CESAREAN SECTION     COLONOSCOPY WITH PROPOFOL N/A 12/23/2021   Procedure: COLONOSCOPY WITH PROPOFOL;  Surgeon: Tressia Danas, MD;  Location: [facility] ENDOSCOPY;  Service: Gastroenterology;  Laterality: N/A;   ESOPHAGOGASTRODUODENOSCOPY N/A 07/15/2016   Procedure: ESOPHAGOGASTRODUODENOSCOPY (EGD);  Surgeon: Carman Ching, MD;  Location: [facility] ENDOSCOPY;  Service: Endoscopy;  Laterality: N/A;   ESOPHAGOGASTRODUODENOSCOPY N/A 12/23/2021   Procedure: ESOPHAGOGASTRODUODENOSCOPY (EGD);  Surgeon: Tressia Danas, MD;  Location: [facility] ENDOSCOPY;  Service: Gastroenterology;  Laterality: N/A;   I & D EXTREMITY Left 05/27/2021  Procedure: IRRIGATION AND DEBRIDEMENT OF WRIST;  Surgeon: Bradly Bienenstock, MD;  Location: [facility] OR;  Service: Orthopedics;  Laterality: Left;   INSERTION OF DIALYSIS CATHETER Right 07/04/2017   Procedure: INSERTION OF TUNNELED  DIALYSIS CATHETER - RIGHT INTERNAL JUGULAR PLACEMENT;  Surgeon: Fransisco Hertz, MD;  Location: [facility] OR;  Service: Vascular;  Laterality: Right;   IR FLUORO GUIDE CV LINE RIGHT  09/08/2021   IR THROMBECTOMY AV FISTULA W/THROMBOLYSIS/PTA  INC/SHUNT/IMG LEFT Left 09/10/2021   IR THROMBECTOMY AV FISTULA W/THROMBOLYSIS/PTA INC/SHUNT/IMG LEFT Left 12/03/2022   IR US GUIDE VASC ACCESS LEFT  09/10/2021   IR US GUIDE VASC ACCESS LEFT  12/03/2022   IR US GUIDE VASC ACCESS RIGHT  09/08/2021   LUNG BIOPSY     POLYPECTOMY  12/23/2021   Procedure: POLYPECTOMY;  Surgeon: Tressia Danas, MD;  Location: [facility] ENDOSCOPY;  Service: Gastroenterology;;   TEE WITHOUT CARDIOVERSION N/A 05/25/2021   Procedure: TRANSESOPHAGEAL ECHOCARDIOGRAM (TEE);  Surgeon: Sande Rives, MD;  Location: [facility] ENDOSCOPY;  Service: Cardiovascular;  Laterality: N/A;   TUBAL LIGATION     Social History:  reports that she has been smoking cigarettes. She has never used smokeless tobacco. She reports that she does not drink alcohol and does not use drugs.  No Known Allergies  Family History  Problem Relation Age of Onset   Ovarian cancer Mother    Breast cancer Sister    Throat cancer Brother     Prior to Admission medications   Medication Sig Start Date End Date Taking? Authorizing Provider  acetaminophen (TYLENOL) 160 MG/5ML liquid Take 320 mg by mouth every 6 (six) hours as needed for fever or pain.   Yes [provider]  cinacalcet (SENSIPAR) 30 MG tablet Take 2 tablets (60 mg total) by mouth every Tuesday, Thursday, and Saturday at 6 PM. 12/07/22  Yes Wouk, Wilfred Curtis, MD  ferrous sulfate 325 (65 FE) MG tablet Take 325 mg by mouth daily.   Yes [provider]  multivitamin (RENA-VIT) TABS tablet Take 1 tablet by mouth daily.   Yes [provider]  pantoprazole (PROTONIX) 40 MG tablet Take 1 tablet (40 mg total) by mouth 2 (two) times daily. 10/30/22 01/15/23 Yes [name], [name], MD  sevelamer carbonate (RENVELA) 800 MG tablet Take 800-2,400 mg by mouth See admin instructions. 2400 mg three times daily with meals, 800 mg with snacks. 08/28/21  Yes [provider]    Physical Exam: Vitals:   12/11/22 1115 12/11/22 1200 12/11/22 1338   BP: (!) 122/91 100/69 107/72  Pulse: 89 78 76  Resp: 18 18 17   Temp: 98.6 F (37 C) 98.3 F (36.8 C) 98.3 F (36.8 C)  TempSrc:  Oral Oral  SpO2: 100% (!) 88% 96%   General: Alert awake, no distress, on room air saturating at 100% Respiratory exam: Bilateral air entry vesicular Cardiovascular exam S1-S2 normal Abdomen soft nontender Extremities warm without edema. The right subclavian site of the tunneled hemodialysis catheter is inspected.  The dressing is in place.  The dressing is opaque.  However there is direct tenderness over the site rather exquisite when palpated from above the dressing.  The dressing appears poorly adherent to the site, aseptic precautions were used and the site was inspected by turning the dressing.  No purulence was noted at the site at this time.  The dressing was replaced and reinforced by the author. Data Reviewed:  Labs on Admission:  Results for orders placed or performed during the hospital encounter of 12/11/22 (  from the past 24 hour(s))  CBC with Differential     Status: Abnormal   Collection Time: 12/11/22 11:15 AM  Result Value Ref Range   WBC 7.2 4.0 - 10.5 K/uL   RBC 3.98 3.87 - 5.11 MIL/uL   Hemoglobin 11.4 (L) 12.0 - 15.0 g/dL   HCT 65.7 84.6 - 96.2 %   MCV 98.5 80.0 - 100.0 fL   MCH 28.6 26.0 - 34.0 pg   MCHC 29.1 (L) 30.0 - 36.0 g/dL   RDW 95.2 (H) 84.1 - 32.4 %   Platelets 314 150 - 400 K/uL   nRBC 0.0 0.0 - 0.2 %   Neutrophils Relative % 57 %   Neutro Abs 4.0 1.7 - 7.7 K/uL   Lymphocytes Relative 27 %   Lymphs Abs 2.0 0.7 - 4.0 K/uL   Monocytes Relative 12 %   Monocytes Absolute 0.9 0.1 - 1.0 K/uL   Eosinophils Relative 3 %   Eosinophils Absolute 0.2 0.0 - 0.5 K/uL   Basophils Relative 1 %   Basophils Absolute 0.1 0.0 - 0.1 K/uL   Immature Granulocytes 0 %   Abs Immature Granulocytes 0.03 0.00 - 0.07 K/uL  Basic metabolic panel     Status: Abnormal   Collection Time: 12/11/22 11:15 AM  Result Value Ref Range   Sodium 138  135 - 145 mmol/L   Potassium 4.6 3.5 - 5.1 mmol/L   Chloride 105 98 - 111 mmol/L   CO2 19 (L) 22 - 32 mmol/L   Glucose, Bld 88 70 - 99 mg/dL   BUN 20 8 - 23 mg/dL   Creatinine, Ser 40.10 (H) 0.44 - 1.00 mg/dL   Calcium 9.9 8.9 - 27.2 mg/dL   GFR, Estimated 4 (L) >60 mL/min   Anion gap 14 5 - 15  Protime-INR     Status: None   Collection Time: 12/11/22 12:31 PM  Result Value Ref Range   Prothrombin Time 14.2 11.4 - 15.2 seconds   INR 1.1 0.8 - 1.2  APTT     Status: None   Collection Time: 12/11/22 12:31 PM  Result Value Ref Range   aPTT 31 24 - 36 seconds  I-Stat Lactic Acid, ED     Status: None   Collection Time: 12/11/22 12:39 PM  Result Value Ref Range   Lactic Acid, Venous 1.9 0.5 - 1.9 mmol/L   Basic Metabolic Panel: Recent Labs  Lab 12/06/22 0155 12/11/22 1115  NA 133* 138  K 3.9 4.6  CL 92* 105  CO2 25 19*  GLUCOSE 88 88  BUN 37* 20  CREATININE 9.98* 10.89*  CALCIUM 8.2* 9.9   Liver Function Tests: No results for input(s): "AST", "ALT", "ALKPHOS", "BILITOT", "PROT", "ALBUMIN" in the last 168 hours. No results for input(s): "LIPASE", "AMYLASE" in the last 168 hours. No results for input(s): "AMMONIA" in the last 168 hours. CBC: Recent Labs  Lab 12/06/22 0155 12/11/22 1115  WBC 6.2 7.2  NEUTROABS  --  4.0  HGB 11.1* 11.4*  HCT 36.4 39.2  MCV 95.0 98.5  PLT 191 314   Cardiac Enzymes: No results for input(s): "CKTOTAL", "CKMB", "CKMBINDEX", "TROPONINIHS" in the last 168 hours.  BNP (last 3 results) No results for input(s): "PROBNP" in the last 8760 hours. CBG: No results for input(s): "GLUCAP" in the last 168 hours.  Radiological Exams on Admission:  DG Chest Port 1 View  Result Date: 12/11/2022 CLINICAL DATA:  Questionable sepsis - evaluate for abnormality EXAM: PORTABLE CHEST 1 VIEW COMPARISON:  CXR 10/27/22 FINDINGS: No pleural effusion. No pneumothorax. Right-sided central venous catheter with tip in the right atrium. Normal cardiac and  mediastinal contours. No focal airspace opacity. No radiographically apparent displaced rib fractures. Visualized upper abdomen is unremarkable. IMPRESSION: No acute cardiopulmonary process. Electronically Signed   By: Lorenza Cambridge M.D.   On: 12/11/2022 12:42    EKG: Independently reviewed. NSR   Assessment and Plan: * Hemodialysis catheter infection [facility]) Chronic ESRD patient on Tuesday Thursday Saturday dialysis.  Patient did not receive dialysis today.  Labs not suggestive of need for emergent dialysis.  I have engaged Dr. Signe Colt of nephrology to help.  Patient has newly noted exit site infection of the hemodialysis catheter in the right subclavian.  At this time no suspicion of bloodstream infection.  Wound culture could not be done at the site because the purulence is so scant.  Blood cultures are pending.  Patient has received vancomycin and cefepime in the ER.  I will continue with prophylactic vancomycin with intention to prevent development of bloodstream infection.  I anticipate comanagement with nephrology and IR to get this catheter removed and have a new catheter placed at distant site for hemodialysis      Advance Care Planning:   Code Status: Full Code   Consults: IR and renal  Family Communication: per pateint  Severity of Illness: The appropriate patient status for this patient is INPATIENT. Inpatient status is judged to be reasonable and necessary in order to provide the required intensity of service to ensure the patient's safety. The patient's presenting symptoms, physical exam findings, and initial radiographic and laboratory data in the context of their chronic comorbidities is felt to place them at high risk for further clinical deterioration. Furthermore, it is not anticipated that the patient will be medically stable for discharge from the hospital within 2 midnights of admission.   * I certify that at the point of admission it is my clinical judgment that the patient  will require inpatient hospital care spanning beyond 2 midnights from the point of admission due to high intensity of service, high risk for further deterioration and high frequency of surveillance required.*  Author: Nolberto Hanlon, MD 12/11/2022 3:42 PM  For on call review [email or web address].

## 2022-12-11 NOTE — ED Provider Notes (Signed)
[facility] EMERGENCY DEPARTMENT AT [facility] Provider Note   CSN: [id number] Arrival date & time: 12/11/22  1052     History  Chief Complaint  Patient presents with   Vascular Access Problem   left arm pain    Kathleen [name] is a 62 y.o. female.  Patient is a 62 year old female with a past medical history of ESRD with right chest wall permacath in place presenting to the emergency department with concern for line infection.  The patient had a catheter placed about 1 week ago after having a clot in her left arm fistula.  She received her full dialysis session as scheduled on Thursday and when she went to dialysis today the dialysis nurses noted purulent drainage around her catheter site and recommended that she come to the emergency department.  The patient states that she gets dressing changes at every dialysis session.  She states that her dressing change was normal on Thursday.  She denies any fevers.  She states that she has been nauseous the last few days and vomited after dialysis on Thursday.  She states that she has some mild chest pain around her catheter site.  She denies any redness or warmth around her catheter site.  The history is provided by the patient.       Home Medications Prior to Admission medications   Medication Sig Start Date End Date Taking? Authorizing Provider  acetaminophen (TYLENOL) 160 MG/5ML liquid Take 320 mg by mouth every 6 (six) hours as needed for fever or pain.    [provider]  cinacalcet (SENSIPAR) 30 MG tablet Take 2 tablets (60 mg total) by mouth every Tuesday, Thursday, and Saturday at 6 PM. 12/07/22   Wouk, Wilfred Curtis, MD  ferrous sulfate 325 (65 FE) MG tablet Take 325 mg by mouth daily.    [provider]  multivitamin (RENA-VIT) TABS tablet Take 1 tablet by mouth daily.    [provider]  pantoprazole (PROTONIX) 40 MG tablet Take 1 tablet (40 mg total) by mouth 2 (two) times daily. 10/30/22 01/15/23   Evette Georges, MD  sevelamer carbonate (RENVELA) 800 MG tablet Take 800-2,400 mg by mouth See admin instructions. 2400 mg three times daily with meals, 800 mg with snacks. 08/28/21   [provider]      Allergies    Patient has no known allergies.    Review of Systems   Review of Systems  Physical Exam Updated Vital Signs BP 100/69 (BP Location: Left Arm)   Pulse 78   Temp 98.3 F (36.8 C) (Oral)   Resp 18   SpO2 (!) 88%  Physical Exam Vitals and nursing note reviewed.  Constitutional:      General: She is not in acute distress.    Appearance: Normal appearance.  HENT:     Head: Normocephalic and atraumatic.     Nose: Nose normal.     Mouth/Throat:     Mouth: Mucous membranes are moist.     Pharynx: Oropharynx is clear.  Eyes:     Extraocular Movements: Extraocular movements intact.     Conjunctiva/sclera: Conjunctivae normal.  Cardiovascular:     Rate and Rhythm: Normal rate and regular rhythm.     Heart sounds: Normal heart sounds.  Pulmonary:     Effort: Pulmonary effort is normal.     Breath sounds: Normal breath sounds.  Abdominal:     General: Abdomen is flat.     Palpations: Abdomen is soft.  Tenderness: There is no abdominal tenderness.  Musculoskeletal:        General: Normal range of motion.     Cervical back: Normal range of motion.  Skin:    General: Skin is warm and dry.     Comments: Right chest wall permacath in place without surrounding erythema or warmth, mild serosanguineous fluid on dressing, no active purulent drainage from site  Neurological:     General: No focal deficit present.     Mental Status: She is alert and oriented to person, place, and time.  Psychiatric:        Mood and Affect: Mood normal.        Behavior: Behavior normal.     ED Results / Procedures / Treatments   Labs (all labs ordered are listed, but only abnormal results are displayed) Labs Reviewed  CBC WITH DIFFERENTIAL/PLATELET - Abnormal; Notable for  the following components:      Result Value   Hemoglobin 11.4 (*)    MCHC 29.1 (*)    RDW 17.1 (*)    All other components within normal limits  BASIC METABOLIC PANEL - Abnormal; Notable for the following components:   CO2 19 (*)    Creatinine, Ser 10.89 (*)    GFR, Estimated 4 (*)    All other components within normal limits  CULTURE, BLOOD (ROUTINE X 2)  CULTURE, BLOOD (ROUTINE X 2)  PROTIME-INR  APTT  I-STAT CG4 LACTIC ACID, ED    EKG None  Radiology DG Chest Port 1 View  Result Date: 12/11/2022 CLINICAL DATA:  Questionable sepsis - evaluate for abnormality EXAM: PORTABLE CHEST 1 VIEW COMPARISON:  CXR 10/27/22 FINDINGS: No pleural effusion. No pneumothorax. Right-sided central venous catheter with tip in the right atrium. Normal cardiac and mediastinal contours. No focal airspace opacity. No radiographically apparent displaced rib fractures. Visualized upper abdomen is unremarkable. IMPRESSION: No acute cardiopulmonary process. Electronically Signed   By: Lorenza Cambridge M.D.   On: 12/11/2022 12:42    Procedures Procedures    Medications Ordered in ED Medications - No data to display  ED Course/ Medical Decision Making/ A&P Clinical Course as of 12/11/22 1331  Sat Dec 11, 2022  1300 No obvious signs of sepsis on exam. Will be initiated on antibiotics and monitored with cultures for concern for catheter infection. [VK]    Clinical Course User Index [VK] Rexford Maus, DO                                 Medical Decision Making This patient presents to the ED with chief complaint(s) of concern for catheter infection with pertinent past medical history of ESRD with R chest wall permacath which further complicates the presenting complaint. The complaint involves an extensive differential diagnosis and also carries with it a high risk of complications and morbidity.    The differential diagnosis includes catheter infection, sepsis, no signs of cellulitis   Additional  history obtained: Additional history obtained from N/A Records reviewed previous admission documents  ED Course and Reassessment: On patient's arrival she is hemodynamically stable in no acute distress.  She has no signs of cellulitis around her catheter site did have a small amount of serosanguineous fluid on her dressing, no active purulence however concern for infection with recent catheter placement.  She will have labs including blood cultures and lactic as well as chest x-ray performed.  She will be evaluated for possible  urgent dialysis today as she did miss her dialysis session today.  Independent labs interpretation:  The following labs were independently interpreted: At baseline without acute abnormalities  Independent visualization of imaging: - I independently visualized the following imaging with scope of interpretation limited to determining acute life threatening conditions related to emergency care: Chest x-ray, which revealed acute disease  Consultation: - Consulted or discussed management/test interpretation w/ external professional: Hospitalist  Consideration for admission or further workup: Patient requires admission for concern for catheter line infection. Social Determinants of health: N/A    Amount and/or Complexity of Data Reviewed Labs: ordered. Radiology: ordered. ECG/medicine tests: ordered.  Risk Decision regarding hospitalization.          Final Clinical Impression(s) / ED Diagnoses Final diagnoses:  Infection of hemodialysis tunneled catheter, initial encounter [facility])    Rx / DC Orders ED Discharge Orders     None         Rexford Maus, DO 12/11/22 1331

## 2022-12-12 DIAGNOSIS — T827XXD Infection and inflammatory reaction due to other cardiac and vascular devices, implants and grafts, subsequent encounter: Secondary | ICD-10-CM

## 2022-12-12 LAB — HEPATIC FUNCTION PANEL
ALT: 11 U/L (ref 0–44)
AST: 24 U/L (ref 15–41)
Albumin: 2.9 g/dL — ABNORMAL LOW (ref 3.5–5.0)
Alkaline Phosphatase: 62 U/L (ref 38–126)
Bilirubin, Direct: <0.1 mg/dL (ref 0.0–0.2)
Total Bilirubin: 0.5 mg/dL (ref 0.3–1.2)
Total Protein: 6.7 g/dL (ref 6.5–8.1)

## 2022-12-12 LAB — CBC
HCT: 35.2 % — ABNORMAL LOW (ref 36.0–46.0)
Hemoglobin: 10.8 g/dL — ABNORMAL LOW (ref 12.0–15.0)
MCH: 28.5 pg (ref 26.0–34.0)
MCHC: 30.7 g/dL (ref 30.0–36.0)
MCV: 92.9 fL (ref 80.0–100.0)
Platelets: 250 10*3/uL (ref 150–400)
RBC: 3.79 MIL/uL — ABNORMAL LOW (ref 3.87–5.11)
RDW: 17.2 % — ABNORMAL HIGH (ref 11.5–15.5)
WBC: 5.9 10*3/uL (ref 4.0–10.5)
nRBC: 0 % (ref 0.0–0.2)

## 2022-12-12 LAB — BASIC METABOLIC PANEL WITH GFR
Anion gap: 11 (ref 5–15)
BUN: 7 mg/dL — ABNORMAL LOW (ref 8–23)
CO2: 25 mmol/L (ref 22–32)
Calcium: 8.8 mg/dL — ABNORMAL LOW (ref 8.9–10.3)
Chloride: 95 mmol/L — ABNORMAL LOW (ref 98–111)
Creatinine, Ser: 5.61 mg/dL — ABNORMAL HIGH (ref 0.44–1.00)
GFR, Estimated: 8 mL/min — ABNORMAL LOW (ref >60)
Glucose, Bld: 76 mg/dL (ref 70–99)
Potassium: 3.4 mmol/L — ABNORMAL LOW (ref 3.5–5.1)
Sodium: 131 mmol/L — ABNORMAL LOW (ref 135–145)

## 2022-12-12 LAB — PHOSPHORUS: Phosphorus: 2.5 mg/dL (ref 2.5–4.6)

## 2022-12-12 LAB — PROTIME-INR
INR: 1.1 (ref 0.8–1.2)
Prothrombin Time: 13.9 s (ref 11.4–15.2)

## 2022-12-12 LAB — APTT: aPTT: 31 s (ref 24–36)

## 2022-12-12 MED ORDER — VANCOMYCIN HCL 500 MG/100ML IV SOLN
500.0000 mg | Freq: Once | INTRAVENOUS | Status: AC
  Administered 2022-12-12: 500 mg via INTRAVENOUS
  Filled 2022-12-12: qty 100

## 2022-12-12 MED ORDER — SEVELAMER CARBONATE 800 MG PO TABS
2400.0000 mg | ORAL_TABLET | Freq: Three times a day (TID) | ORAL | Status: DC
  Administered 2022-12-12 – 2022-12-15 (×9): 2400 mg via ORAL
  Filled 2022-12-12 (×9): qty 3

## 2022-12-12 MED ORDER — RENA-VITE PO TABS
1.0000 | ORAL_TABLET | Freq: Every day | ORAL | Status: DC
  Administered 2022-12-12 – 2022-12-13 (×2): 1 via ORAL
  Filled 2022-12-12 (×2): qty 1

## 2022-12-12 NOTE — Progress Notes (Addendum)
[facility] Progress Note   Subjective: Seen at bedside, eating corn starch. Asked her not to do this! Able to use AVG without complications. Will remove TDC and not replace. No C/Os this AM.     Objective Vitals:   12/12/22 0206 12/12/22 0226 12/12/22 0533 12/12/22 0845  BP:  117/84 128/79 116/81  Pulse:  83 77 80  Resp:   18 18  Temp:  98.6 F (37 C) 98.5 F (36.9 C) 97.6 F (36.4 C)  TempSrc:  Oral  Oral  SpO2:  100% 100% 100%  Weight: 51.7 kg      General: Very pleasant older female in no acute distress. Lungs: CTAB No WOB Heart: RRR with S1 S2. No murmurs, rubs, or gallops appreciated. Abdomen: NABS, NT, ND Lower extremities:without edema or ischemic changes, no open wounds  Neuro: Alert and oriented X 3. Moves all extremities spontaneously. Dialysis Access:L AVG + T/B. RIJ TDC Drsg CDI.     Additional Objective Labs: Basic Metabolic Panel: Recent Labs  Lab 12/06/22 0155 12/11/22 1115 12/11/22 1602 12/12/22 0419  NA 133* 138  --  131*  K 3.9 4.6  --  3.4*  CL 92* 105  --  95*  CO2 25 19*  --  25  GLUCOSE 88 88  --  76  BUN 37* 20  --  7*  CREATININE 9.98* 10.89* 9.84* 5.61*  CALCIUM 8.2* 9.9  --  8.8*  PHOS  --   --   --  2.5   Liver Function Tests: Recent Labs  Lab 12/12/22 0419  AST 24  ALT 11  ALKPHOS 62  BILITOT 0.5  PROT 6.7  ALBUMIN 2.9*   No results for input(s): "LIPASE", "AMYLASE" in the last 168 hours. CBC: Recent Labs  Lab 12/06/22 0155 12/11/22 1115 12/11/22 1602 12/12/22 0419  WBC 6.2 7.2 6.0 5.9  NEUTROABS  --  4.0  --   --   HGB 11.1* 11.4* 8.9* 10.8*  HCT 36.4 39.2 32.1* 35.2*  MCV 95.0 98.5 102.9* 92.9  PLT 191 314 207 250   Blood Culture    Component Value Date/Time   SDES PUSTULE 12/11/2022 1520   SPECREQUEST NONE 12/11/2022 1520   CULT  12/11/2022 1520    NO GROWTH < 24 HOURS Performed at [facility], [address], [place], [place] [address]    REPTSTATUS PENDING 12/11/2022 1520     Cardiac Enzymes: No results for input(s): "CKTOTAL", "CKMB", "CKMBINDEX", "TROPONINI" in the last 168 hours. CBG: No results for input(s): "GLUCAP" in the last 168 hours. Iron Studies: No results for input(s): "IRON", "TIBC", "TRANSFERRIN", "FERRITIN" in the last 72 hours. @lablastinr3 @ Studies/Results: DG Chest Port 1 View  Result Date: 12/11/2022 CLINICAL DATA:  Questionable sepsis - evaluate for abnormality EXAM: PORTABLE CHEST 1 VIEW COMPARISON:  CXR 10/27/22 FINDINGS: No pleural effusion. No pneumothorax. Right-sided central venous catheter with tip in the right atrium. Normal cardiac and mediastinal contours. No focal airspace opacity. No radiographically apparent displaced rib fractures. Visualized upper abdomen is unremarkable. IMPRESSION: No acute cardiopulmonary process. Electronically Signed   By: Lorenza Cambridge M.D.   On: 12/11/2022 12:42   Medications:  vancomycin      Chlorhexidine Gluconate Cloth  6 each Topical Q0600   [START ON 12/14/2022] cinacalcet  60 mg Oral Q T,Th,Sa-HD   [START ON 12/14/2022] doxercalciferol  2 mcg Intravenous Q T,Th,Sa-HD   heparin  5,000 Units Subcutaneous Q8H   multivitamin  1 tablet Oral Daily  pantoprazole  40 mg Oral BID   sevelamer carbonate  3,200 mg Oral TID WC   sodium chloride flush  3 mL Intravenous Q12H   vancomycin variable dose per unstable renal function (pharmacist dosing)   Does not apply See admin instructions     Dialysis Orders: [place], T,Th,S 3.5 hrs 160NRe 350/Autoflow 1.5 50.3 kg 2.0 K/2.0 Ca AVG/TDC - Heparin 3600 units IV three times per week - Hectorol 3 mcg IV three times per week - Cinacalcet 60 mg PO three times per week - Mircera 100 mcg IV q 2 weeks (last dose 12/07/2022) - Venofer 50 mg IV weekly     Assessment/Plan:  Possible TDC exit site infection: BC/WC NG < 24 hrs. No Leukocytosis. Empiric ABX started per primary.  Pt is afebrile, does not appear toxic.   ESRD -  Missed HD today because she was sent  to ED instead. Will have HD later tonight. AVG functioned without issues. Remove TDC per IR tomorrow and do not replace. Next HD 12/14/2022  Hypertension/volume  - BP controlled. No evidence of volume excess by exam or CXR. UF as tolerated.   Anemia  - HGB 10.8. Recent ESA dose 12/07/2022. Hold venofer in setting of possible infection. Follow HGB.   Metabolic bone disease -  Last PO4 9.5 on admission now 2.5! Decrease binder dose. Corrected calcium high. Decrease VDRA dose.   Nutrition - Last Albumin 2.9 12/04/2022. Add protein supplements.    H.  NP-C 12/12/2022, 11:03 AM  [employer] [phone]

## 2022-12-12 NOTE — Progress Notes (Addendum)
IR was requested for evaluation of perm cath, patient was sent to ED by dialysis RN due to purulence noted on the dressing, and tenderness and pain around the catheter.    This perm cath does not appears to be placed by IR, patient states that she had this perm cath placed last week, per EDP note from 7/25 Thursday it states that "She did not get dialysis on Tuesday and they then noted she had a clot in her graft in her left upper arm. They placed a tunneling catheter today and told her to come to the hospital for dialysis."   Patient underwent LUE AVF fistulogram and declot, angioplasty by Dr. Loreta Ave on 12/03/22, excellent thrill at completion.   Cx sent from pustule yesterday, no growth so far, blood cx NG less than 24 hours, ni leukocytosis, no fever.  Patient with TTP along the catheter beneath the skin from insertion site all the way up to R IJ, no focal TTP, no purulence, skin w/o increased warmth or erythema, there is a small open wound about 1.5 inches superior from the catheter insertion site w/o drainage.   Patient states that she went throguth HD via her LUE AVF/G this morning w/o difficulty.  Informed the patient that will wait cx result and if positive, she will be scheduled for PC removal.  Patient with hx of thrombosed AVF, s/p AVF/G redo, not a candidate for right arm AVF creation due to right upper extremity arterial occlusive disease, s/p declot with IR on 12/03/22.  Patient is scheduled for VS f/u outpt visit  01/12/23, if cx negative, no plan for perm cath removal until patient is cleared by vascular surgery. If blood cx come back positive, please order  IR REMOVAL TUN CV.   IR rad eval order discontinued.    Lynann Bologna  PA-C 12/12/2022 11:55 AM

## 2022-12-12 NOTE — Progress Notes (Signed)
Pharmacy Antibiotic Note  Kathleen [name] is a 62 y.o. female for which pharmacy has been consulted for vancomycin dosing for  HD catheter site infection .  Patient with a history of ESRD on HD w/ rt chest wall permacath (placed ~1 week ago). HD RN noted purulent drainage from cath today at HD.  Patient got a full HD session last pm after Vanc dose.  Plan: Cefepime 2g given once in ED , not continued inpatient Vancomycin 1g given in ED - repeat dosing needed due to full HD session last pm - 500 mg IV x 1 Monitor WBC, fever, renal function, cultures De-escalate when able F/u Nephrology plan  Weight: 51.7 kg (113 lb 15.7 oz)  Temp (24hrs), Avg:98.2 F (36.8 C), Min:97.5 F (36.4 C), Max:98.6 F (37 C)  Recent Labs  Lab 12/06/22 0155 12/11/22 1115 12/11/22 1239 12/11/22 1602 12/11/22 1609 12/12/22 0419  WBC 6.2 7.2  --  6.0  --  5.9  CREATININE 9.98* 10.89*  --  9.84*  --  5.61*  LATICACIDVEN  --   --  1.9  --  0.8  --     Estimated Creatinine Clearance: 8.5 mL/min (A) (by C-G formula based on SCr of 5.61 mg/dL (H)).    No Known Allergies  Microbiology results: Pending  Thank you for allowing pharmacy to be a part of this patient's care.  Jeanella Cara, PharmD, [facility] Clinical Pharmacist Please see AMION for all Pharmacists' Contact Phone Numbers 12/12/2022, 8:59 AM

## 2022-12-12 NOTE — Progress Notes (Signed)
Kathleen [name]  JXB:[id number] DOB: 02/16/1961 DOA: 12/11/2022 PCP: Hildred Priest, NP    Brief Narrative:  62 year old with a history of SLE and ESRD on HD TTS via a right subclavian tunneled HD catheter who presented to the ER 8/3 with pain and tenderness at the insertion site of her catheter with exam revealing a purulent discharge and concern for an acute catheter infection.  Goals of Care:   Code Status: Full Code   DVT prophylaxis: heparin injection 5,000 Units Start: 12/11/22 1530 SCDs Start: 12/11/22 1525  Interim Hx: Afebrile.  Vital signs stable.  Resting comfortably in bed.  Has no new complaints.  Assessment & Plan:  HD catheter infection Presently this appears to be a superficial infection with no evidence of bacteremia thus far -continue empiric broad-spectrum antibiotic -blood cultures no growth thus far -HD catheter to be removed 8/5 in IR  ESRD on HD Nephrology attending to this issue -her subclavian TDC was placed 12/02/2022 during admission for a malfunctioning AVG -at this time her AVG appears to be functional as it was successfully used for dialysis early a.m. 8/4  SLE Biopsy-proven lupus nephritis -does not appear to require chronic immunosuppressive medications  Family Communication:  Disposition:     Objective: Blood pressure 116/81, pulse 80, temperature 97.6 F (36.4 C), temperature source Oral, resp. rate 18, weight 51.7 kg, SpO2 100%.  Intake/Output Summary (Last 24 hours) at 12/12/2022 1001 Last data filed at 12/12/2022 0531 Gross per 24 hour  Intake --  Output 1500 ml  Net -1500 ml   Filed Weights   12/12/22 0206  Weight: 51.7 kg    Examination: General: No acute respiratory distress Lungs: Clear to auscultation bilaterally without wheezes or crackles Cardiovascular: Regular rate and rhythm without murmur  Abdomen: Nontender, nondistended, soft, bowel sounds positive, no rebound, no ascites, no appreciable mass Extremities: No  significant cyanosis, clubbing, or edema bilateral lower extremities  CBC: Recent Labs  Lab 12/11/22 1115 12/11/22 1602 12/12/22 0419  WBC 7.2 6.0 5.9  NEUTROABS 4.0  --   --   HGB 11.4* 8.9* 10.8*  HCT 39.2 32.1* 35.2*  MCV 98.5 102.9* 92.9  PLT 314 207 250   Basic Metabolic Panel: Recent Labs  Lab 12/06/22 0155 12/11/22 1115 12/11/22 1602 12/12/22 0419  NA 133* 138  --  131*  K 3.9 4.6  --  3.4*  CL 92* 105  --  95*  CO2 25 19*  --  25  GLUCOSE 88 88  --  76  BUN 37* 20  --  7*  CREATININE 9.98* 10.89* 9.84* 5.61*  CALCIUM 8.2* 9.9  --  8.8*  PHOS  --   --   --  2.5   GFR: Estimated Creatinine Clearance: 8.5 mL/min (A) (by C-G formula based on SCr of 5.61 mg/dL (H)).   Scheduled Meds:  Chlorhexidine Gluconate Cloth  6 each Topical Q0600   [START ON 12/14/2022] cinacalcet  60 mg Oral Q T,Th,Sa-HD   [START ON 12/14/2022] doxercalciferol  2 mcg Intravenous Q T,Th,Sa-HD   heparin  5,000 Units Subcutaneous Q8H   pantoprazole  40 mg Oral BID   sevelamer carbonate  3,200 mg Oral TID WC   sodium chloride flush  3 mL Intravenous Q12H   vancomycin variable dose per unstable renal function (pharmacist dosing)   Does not apply See admin instructions   Continuous Infusions:  vancomycin       LOS: 1 day   Lonia Blood, MD [facility] Office  [phone] Pager - Text Page per Loretha Stapler  If 7PM-7AM, please contact night-coverage per Amion 12/12/2022, 10:01 AM

## 2022-12-12 NOTE — Progress Notes (Addendum)
Received patient in bed to unit.  Alert and oriented.  Informed consent signed and in chart.   TX duration:3.5  Patient tolerated well.  Transported back to the room  Alert, without acute distress.  Hand-off given to patient's nurse.   Access used: graft Access issues: none  Total UF removed: 1500 ml Medication(s) given: none Post HD VS: 134/75 Post HD weight: 51.7 kg    12/12/22 0149  Vitals  Temp 98.4 F (36.9 C)  Temp Source Oral  BP 134/75  MAP (mmHg) 91  BP Location Right Arm  BP Method Automatic  Patient Position (if appropriate) Lying  Pulse Rate 79  Pulse Rate Source Monitor  ECG Heart Rate 79  Resp 15  Oxygen Therapy  SpO2 100 %  O2 Device Room Air  Patient Activity (if Appropriate) In bed  Pulse Oximetry Type Continuous  Post Treatment  Dialyzer Clearance Lightly streaked  Duration of HD Treatment -hour(s) 3.5 hour(s)  Hemodialysis Intake (mL) 0 mL  Liters Processed 73.4  Fluid Removed (mL) 1500 mL  Tolerated HD Treatment Yes  Post-Hemodialysis Comments HD tx achieved as expected, tolerated well. no complaints, pt is stable.  AVG/AVF Arterial Site Held (minutes) 10 minutes  AVG/AVF Venous Site Held (minutes) 10 minutes  Note  Patient Observations pt is bed resting.  Fistula / Graft Left Upper arm Arteriovenous vein graft  Placement Date/Time: (c) 05/16/21 1535   Placed prior to admission: Yes  Orientation: Left  Access Location: Upper arm  Access Type: (c) Arteriovenous vein graft  Site Condition No complications  Fistula / Graft Assessment Present;Thrill;Bruit  Status Deaccessed  Drainage Description None

## 2022-12-13 DIAGNOSIS — Z992 Dependence on renal dialysis: Secondary | ICD-10-CM

## 2022-12-13 DIAGNOSIS — N186 End stage renal disease: Secondary | ICD-10-CM

## 2022-12-13 DIAGNOSIS — T827XXD Infection and inflammatory reaction due to other cardiac and vascular devices, implants and grafts, subsequent encounter: Secondary | ICD-10-CM | POA: Diagnosis not present

## 2022-12-13 DIAGNOSIS — T827XXA Infection and inflammatory reaction due to other cardiac and vascular devices, implants and grafts, initial encounter: Secondary | ICD-10-CM

## 2022-12-13 MED ORDER — RENA-VITE PO TABS
1.0000 | ORAL_TABLET | Freq: Every day | ORAL | Status: DC
  Administered 2022-12-14: 1 via ORAL
  Filled 2022-12-13: qty 1

## 2022-12-13 MED ORDER — ARTIFICIAL TEARS OPHTHALMIC OINT
TOPICAL_OINTMENT | OPHTHALMIC | Status: DC | PRN
  Filled 2022-12-13: qty 3.5

## 2022-12-13 NOTE — Consult Note (Addendum)
Hospital Consult    Reason for Consult:  L arm AVG suspected infection Requesting Physician:  Nephrology MRN #:  [id number]  History of Present Illness: This is a 62 y.o. female with end-stage renal disease on hemodialysis.  She was admitted with suspected HD catheter infection.  Vascular surgery was consulted to assess left arm AV graft for infection.  Patient completed a full treatment of HD via left arm AV graft yesterday 8/4.  During recent hospitalization she underwent mechanical thrombectomy of her left arm AV graft.  She states the pain in her left arm related to her graft has improved since thrombectomy.  She has noticed pus from her right IJ [facility] exit site.  She currently denies any fevers or chills.  Next HD is scheduled for tomorrow.  No growth from blood cultures to date.  Past Medical History:  Diagnosis Date   Anemia    Arthritis    Cardiac arrest (HCC)    Cellulitis    Cerebral vasculitis    COPD (chronic obstructive pulmonary disease) (HCC)    Elevated antinuclear antibody (ANA) level    Endometriosis    ESRD (end stage renal disease) on dialysis (HCC)    Gallstones    Hypertension    Lupus (HCC)    Migraine    PONV (postoperative nausea and vomiting)    Seizures (HCC)    Sepsis (HCC)    Sleep apnea    Suicide attempt (HCC)    TB (tuberculosis)    Transient ischemic attack (TIA)    Vasculitis of skin     Past Surgical History:  Procedure Laterality Date   AV FISTULA PLACEMENT Left 07/04/2017   Procedure: ARTERIOVENOUS (AV) FISTULA CREATION;  Surgeon: Fransisco Hertz, MD;  Location: [facility] OR;  Service: Vascular;  Laterality: Left;   AV FISTULA PLACEMENT Left 11/27/2019   Procedure: INSERTION OF ARTERIOVENOUS (AV) GORE-TEX GRAFT LEFT UPPER ARM;  Surgeon: Chuck Hint, MD;  Location: [facility] OR;  Service: Vascular;  Laterality: Left;   AV FISTULA PLACEMENT Left 07/29/2020   Procedure: INSERTION OF LEFT UPPER ARM ARTERIOVENOUS (AV) GORE-TEX GRAFT;  Surgeon: Chuck Hint, MD;  Location: [facility] OR;  Service: Vascular;  Laterality: Left;   BIOPSY  12/23/2021   Procedure: BIOPSY;  Surgeon: Tressia Danas, MD;  Location: [facility] ENDOSCOPY;  Service: Gastroenterology;;   BUBBLE STUDY  05/25/2021   Procedure: BUBBLE STUDY;  Surgeon: Sande Rives, MD;  Location: [facility] ENDOSCOPY;  Service: Cardiovascular;;   CESAREAN SECTION     COLONOSCOPY WITH PROPOFOL N/A 12/23/2021   Procedure: COLONOSCOPY WITH PROPOFOL;  Surgeon: Tressia Danas, MD;  Location: [facility] ENDOSCOPY;  Service: Gastroenterology;  Laterality: N/A;   ESOPHAGOGASTRODUODENOSCOPY N/A 07/15/2016   Procedure: ESOPHAGOGASTRODUODENOSCOPY (EGD);  Surgeon: Carman Ching, MD;  Location: [facility] ENDOSCOPY;  Service: Endoscopy;  Laterality: N/A;   ESOPHAGOGASTRODUODENOSCOPY N/A 12/23/2021   Procedure: ESOPHAGOGASTRODUODENOSCOPY (EGD);  Surgeon: Tressia Danas, MD;  Location: [facility] ENDOSCOPY;  Service: Gastroenterology;  Laterality: N/A;   I & D EXTREMITY Left 05/27/2021   Procedure: IRRIGATION AND DEBRIDEMENT OF WRIST;  Surgeon: Bradly Bienenstock, MD;  Location: [facility] OR;  Service: Orthopedics;  Laterality: Left;   INSERTION OF DIALYSIS CATHETER Right 07/04/2017   Procedure: INSERTION OF TUNNELED  DIALYSIS CATHETER - RIGHT INTERNAL JUGULAR PLACEMENT;  Surgeon: Fransisco Hertz, MD;  Location: [facility] OR;  Service: Vascular;  Laterality: Right;   IR FLUORO GUIDE CV LINE RIGHT  09/08/2021   IR THROMBECTOMY AV FISTULA W/THROMBOLYSIS/PTA INC/SHUNT/IMG LEFT Left 09/10/2021  IR THROMBECTOMY AV FISTULA W/THROMBOLYSIS/PTA INC/SHUNT/IMG LEFT Left 12/03/2022   IR US GUIDE VASC ACCESS LEFT  09/10/2021   IR US GUIDE VASC ACCESS LEFT  12/03/2022   IR US GUIDE VASC ACCESS RIGHT  09/08/2021   LUNG BIOPSY     POLYPECTOMY  12/23/2021   Procedure: POLYPECTOMY;  Surgeon: Tressia Danas, MD;  Location: [facility] ENDOSCOPY;  Service: Gastroenterology;;   TEE WITHOUT CARDIOVERSION N/A 05/25/2021   Procedure: TRANSESOPHAGEAL ECHOCARDIOGRAM (TEE);  Surgeon: Sande Rives, MD;  Location: [facility] ENDOSCOPY;  Service: Cardiovascular;  Laterality: N/A;   TUBAL LIGATION      No Known Allergies  Prior to Admission medications   Medication Sig Start Date End Date Taking? Authorizing Provider  acetaminophen (TYLENOL) 160 MG/5ML liquid Take 320 mg by mouth every 6 (six) hours as needed for fever or pain.   Yes [provider]  cinacalcet (SENSIPAR) 30 MG tablet Take 2 tablets (60 mg total) by mouth every Tuesday, Thursday, and Saturday at 6 PM. 12/07/22  Yes Wouk, Wilfred Curtis, MD  ferrous sulfate 325 (65 FE) MG tablet Take 325 mg by mouth daily.   Yes [provider]  multivitamin (RENA-VIT) TABS tablet Take 1 tablet by mouth daily.   Yes [provider]  pantoprazole (PROTONIX) 40 MG tablet Take 1 tablet (40 mg total) by mouth 2 (two) times daily. 10/30/22 01/15/23 Yes [name], [name], MD  sevelamer carbonate (RENVELA) 800 MG tablet Take 800-2,400 mg by mouth See admin instructions. 2400 mg three times daily with meals, 800 mg with snacks. 08/28/21  Yes [provider]    Social History   Socioeconomic History   Marital status: Significant Other    Spouse name: Not on file   Number of children: Not on file   Years of education: Not on file   Highest education level: Not on file  Occupational History   Not on file  Tobacco Use   Smoking status: Some Days    Current packs/day: 0.25    Types: Cigarettes   Smokeless tobacco: Never  Vaping Use   Vaping status: Never Used  Substance and Sexual Activity   Alcohol use: No   Drug use: No   Sexual activity: Not Currently    Birth control/protection: Post-menopausal  Other Topics Concern   Not on file  Social History Narrative   Not on file   Social Determinants of Health   Financial Resource Strain: Not on file  Food Insecurity: No Food Insecurity (12/11/2022)   Hunger Vital Sign    Worried About Running Out of Food in the Last Year: Never true    Ran Out of Food in  the Last Year: Never true  Transportation Needs: No Transportation Needs (12/11/2022)   PRAPARE - Administrator, Civil Service (Medical): No    Lack of Transportation (Non-Medical): No  Physical Activity: Not on file  Stress: Not on file  Social Connections: Not on file  Intimate Partner Violence: Not At Risk (12/11/2022)   Humiliation, Afraid, Rape, and Kick questionnaire    Fear of Current or Ex-Partner: No    Emotionally Abused: No    Physically Abused: No    Sexually Abused: No     Family History  Problem Relation Age of Onset   Ovarian cancer Mother    Breast cancer Sister    Throat cancer Brother     ROS: Otherwise negative unless mentioned in HPI  Physical Examination  Vitals:   12/13/22 0446 12/13/22  0920  BP: 124/77 (!) 131/104  Pulse: 79 79  Resp: 18 19  Temp: 98.1 F (36.7 C) 98.2 F (36.8 C)  SpO2: 100% 100%   Body mass index is 19.56 kg/m.  General:  WDWN in NAD Gait: Not observed HENT: WNL, normocephalic Pulmonary: normal non-labored breathing, without Rales, rhonchi,  wheezing Cardiac: regular Abdomen:  soft, NT/ND, no masses Skin: without rashes Vascular Exam/Pulses: Palpable left radial pulse; unable to palpate right radial pulse; pulsatile flow through left arm AV graft; thick yellow drainage on dressing over right IJ TDC Extremities: without ischemic changes, without Gangrene , without cellulitis; without open wounds;  Musculoskeletal: no muscle wasting or atrophy  Neurologic: A&O X 3;  No focal weakness or paresthesias are detected; speech is fluent/normal Psychiatric:  The pt has Normal affect. Lymph:  Unremarkable  CBC    Component Value Date/Time   WBC 5.9 12/12/2022 0419   RBC 3.79 (L) 12/12/2022 0419   HGB 10.8 (L) 12/12/2022 0419   HCT 35.2 (L) 12/12/2022 0419   PLT 250 12/12/2022 0419   MCV 92.9 12/12/2022 0419   MCH 28.5 12/12/2022 0419   MCHC 30.7 12/12/2022 0419   RDW 17.2 (H) 12/12/2022 0419   LYMPHSABS 2.0  12/11/2022 1115   MONOABS 0.9 12/11/2022 1115   EOSABS 0.2 12/11/2022 1115   BASOSABS 0.1 12/11/2022 1115    BMET    Component Value Date/Time   NA 131 (L) 12/12/2022 0419   K 3.4 (L) 12/12/2022 0419   CL 95 (L) 12/12/2022 0419   CO2 25 12/12/2022 0419   GLUCOSE 76 12/12/2022 0419   BUN 7 (L) 12/12/2022 0419   CREATININE 5.61 (H) 12/12/2022 0419   CALCIUM 8.8 (L) 12/12/2022 0419   GFRNONAA 8 (L) 12/12/2022 0419   GFRAA 3 (L) 01/29/2020 0738    COAGS: Lab Results  Component Value Date   INR 1.1 12/12/2022   INR 1.1 12/11/2022   INR 1.2 12/03/2022      ASSESSMENT/PLAN: This is a 62 y.o. female being treated for right IJ TDC infection  -Ms. Kathleen [name] is a 62 year old female with history of left redo upper arm loop graft by Dr. Edilia Bo in March 2022.  She recently underwent mechanical thrombectomy of her left arm graft with interventional radiology.  She successfully completed HD treatment via left arm graft yesterday.  On exam there is no sign of fluid collection or infection of left arm AV graft.  Recommend removal of right IJ TDC.  Ok to continue HD via left arm AV graft.  Dr. Edilia Bo noted last admission that patient will require thigh graft placement should she require new dialysis access in the future.  She has right arm arterial insufficiency and would not be a candidate for right arm access.  Dr. Edilia Bo also recommended preoperative angiography of bilateral lower extremities prior to thigh graft placement.  For now, agree with removal of TDC and continued use of left arm AV graft.  On-call vascular surgeon Dr. Karin Lieu will evaluate the patient later today and provide further treatment plans.   Kathleen Rutter PA-C [facility] [phone]   VASCULAR STAFF ADDENDUM: I have independently interviewed and examined the patient. I agree with the above.  Recommend pulling TDC. AVG not infected. Use AVG.  Fara Olden, MD [facility] Phone Number: [phone] 12/13/2022 7:58 PM

## 2022-12-13 NOTE — Progress Notes (Signed)
Pt receives out-pt HD at [facility] on TTS. Will assist as needed.   Melven Sartorius Renal Navigator [phone]

## 2022-12-13 NOTE — Progress Notes (Signed)
Kathleen [name]  WGN:[id number] DOB: [birth year] DOA: 12/11/2022 PCP: Hildred Priest, NP    Brief Narrative:  62 year old with a history of SLE and ESRD on HD TTS via a right subclavian tunneled HD catheter who presented to the ER 8/3 with pain and tenderness at the insertion site of her catheter with exam revealing a purulent discharge and concern for an acute catheter infection.  Goals of Care:   Code Status: Full Code   DVT prophylaxis: heparin injection 5,000 Units Start: 12/11/22 1530 SCDs Start: 12/11/22 1525  Interim Hx: No acute events recorded overnight.  Afebrile.  Vital signs stable.  In good spirits.  No new complaints.  Denies chest pain or shortness of breath.  Assessment & Plan:  HD catheter infection Presently this appears to be a superficial infection with no evidence of bacteremia thus far -continue empiric broad-spectrum antibiotic -blood cultures no growth thus far -HD catheter to be removed 8/5 with blessing of Vasc Surgery to do so   ESRD on HD Nephrology attending to this issue -her subclavian TDC was placed 12/02/2022 during admission for a malfunctioning AVG -at this time her AVG appears to be functional as it was successfully used for dialysis early a.m. 8/4 -cleared to continue using AVG per vascular surgery evaluation  SLE Biopsy-proven lupus nephritis -does not appear to require chronic immunosuppressive medications  Family Communication: No family present Disposition: Plan for discharge home    Objective: Blood pressure (!) 131/104, pulse 79, temperature 98.2 F (36.8 C), temperature source Oral, resp. rate 19, weight 51.7 kg, SpO2 100%.  Intake/Output Summary (Last 24 hours) at 12/13/2022 1038 Last data filed at 12/13/2022 0646 Gross per 24 hour  Intake --  Output 1 ml  Net -1 ml   Filed Weights   12/12/22 0206  Weight: 51.7 kg    Examination: General: No acute respiratory distress Lungs: Clear to auscultation bilaterally  Cardiovascular:  RRR Abdomen: NT/ND, soft, BS positive Extremities: No significant cyanosis, clubbing, or edema bilateral lower extremities  CBC: Recent Labs  Lab 12/11/22 1115 12/11/22 1602 12/12/22 0419  WBC 7.2 6.0 5.9  NEUTROABS 4.0  --   --   HGB 11.4* 8.9* 10.8*  HCT 39.2 32.1* 35.2*  MCV 98.5 102.9* 92.9  PLT 314 207 250   Basic Metabolic Panel: Recent Labs  Lab 12/11/22 1115 12/11/22 1602 12/12/22 0419  NA 138  --  131*  K 4.6  --  3.4*  CL 105  --  95*  CO2 19*  --  25  GLUCOSE 88  --  76  BUN 20  --  7*  CREATININE 10.89* 9.84* 5.61*  CALCIUM 9.9  --  8.8*  PHOS  --   --  2.5   GFR: Estimated Creatinine Clearance: 8.5 mL/min (A) (by C-G formula based on SCr of 5.61 mg/dL (H)).   Scheduled Meds:  Chlorhexidine Gluconate Cloth  6 each Topical Q0600   [START ON 12/14/2022] cinacalcet  60 mg Oral Q T,Th,Sa-HD   [START ON 12/14/2022] doxercalciferol  2 mcg Intravenous Q T,Th,Sa-HD   heparin  5,000 Units Subcutaneous Q8H   multivitamin  1 tablet Oral Daily   pantoprazole  40 mg Oral BID   sevelamer carbonate  2,400 mg Oral TID WC   sodium chloride flush  3 mL Intravenous Q12H   vancomycin variable dose per unstable renal function (pharmacist dosing)   Does not apply See admin instructions     LOS: 2 days   Tinnie Gens T.  Sharon Seller, MD [facility] Office  [phone] Pager - Text Page per Amion  If 7PM-7AM, please contact night-coverage per Amion 12/13/2022, 10:38 AM

## 2022-12-13 NOTE — Plan of Care (Signed)

## 2022-12-13 NOTE — Progress Notes (Signed)
[facility] Progress Note   Subjective:    Seen and examined patient at bedside. No acute complaints. Per notes, L AVG ran fine without any issues on Saturday. Blood cultures NGTD. Plan for [facility] removal-awaiting VVS clearance on this. Next HD 8/6 using L AVG.  Objective Vitals:   12/12/22 1637 12/12/22 2038 12/13/22 0446 12/13/22 0920  BP: 105/80 100/73 124/77 (!) 131/104  Pulse: 83 84 79 79  Resp: 19  18 19   Temp: 99.1 F (37.3 C) 98.5 F (36.9 C) 98.1 F (36.7 C) 98.2 F (36.8 C)  TempSrc:  Oral Oral Oral  SpO2: 99% 97% 100% 100%  Weight:       Physical Exam General: Very pleasant older female in no acute distress. Lungs: CTAB No WOB Heart: RRR with S1 S2. No murmurs, rubs, or gallops appreciated. Abdomen: NABS, NT, ND Lower extremities:without edema or ischemic changes, no open wounds  Neuro: Alert and oriented X 3. Moves all extremities spontaneously. Dialysis Access:L AVG + T/B. RIJ TDC Drsg CDI   Filed Weights   12/12/22 0206  Weight: 51.7 kg    Intake/Output Summary (Last 24 hours) at 12/13/2022 1325 Last data filed at 12/13/2022 0646 Gross per 24 hour  Intake --  Output 1 ml  Net -1 ml    Additional Objective Labs: Basic Metabolic Panel: Recent Labs  Lab 12/11/22 1115 12/11/22 1602 12/12/22 0419  NA 138  --  131*  K 4.6  --  3.4*  CL 105  --  95*  CO2 19*  --  25  GLUCOSE 88  --  76  BUN 20  --  7*  CREATININE 10.89* 9.84* 5.61*  CALCIUM 9.9  --  8.8*  PHOS  --   --  2.5   Liver Function Tests: Recent Labs  Lab 12/12/22 0419  AST 24  ALT 11  ALKPHOS 62  BILITOT 0.5  PROT 6.7  ALBUMIN 2.9*   No results for input(s): "LIPASE", "AMYLASE" in the last 168 hours. CBC: Recent Labs  Lab 12/11/22 1115 12/11/22 1602 12/12/22 0419  WBC 7.2 6.0 5.9  NEUTROABS 4.0  --   --   HGB 11.4* 8.9* 10.8*  HCT 39.2 32.1* 35.2*  MCV 98.5 102.9* 92.9  PLT 314 207 250   Blood Culture    Component Value Date/Time   SDES PUSTULE  12/11/2022 1520   SPECREQUEST NONE 12/11/2022 1520   CULT  12/11/2022 1520    NO GROWTH 2 DAYS Performed at [facility], [address], [place], [place] [address]    REPTSTATUS PENDING 12/11/2022 1520    Cardiac Enzymes: No results for input(s): "CKTOTAL", "CKMB", "CKMBINDEX", "TROPONINI" in the last 168 hours. CBG: No results for input(s): "GLUCAP" in the last 168 hours. Iron Studies: No results for input(s): "IRON", "TIBC", "TRANSFERRIN", "FERRITIN" in the last 72 hours. Lab Results  Component Value Date   INR 1.1 12/12/2022   INR 1.1 12/11/2022   INR 1.2 12/03/2022   Studies/Results: No results found.  Medications:   Chlorhexidine Gluconate Cloth  6 each Topical Q0600   [START ON 12/14/2022] cinacalcet  60 mg Oral Q T,Th,Sa-HD   [START ON 12/14/2022] doxercalciferol  2 mcg Intravenous Q T,Th,Sa-HD   heparin  5,000 Units Subcutaneous Q8H   multivitamin  1 tablet Oral Daily   pantoprazole  40 mg Oral BID   sevelamer carbonate  2,400 mg Oral TID WC   sodium chloride flush  3 mL Intravenous Q12H   vancomycin  variable dose per unstable renal function (pharmacist dosing)   Does not apply See admin instructions    Dialysis Orders: [place], T,Th,S 3.5 hrs 160NRe 350/Autoflow 1.5 50.3 kg 2.0 K/2.0 Ca AVG/TDC - Heparin 3600 units IV three times per week - Hectorol 3 mcg IV three times per week - Cinacalcet 60 mg PO three times per week - Mircera 100 mcg IV q 2 weeks (last dose 12/07/2022) - Venofer 50 mg IV weekly  Assessment/Plan: Possible TDC exit site infection: BC/WC NG < 24 hrs. No Leukocytosis. Empiric ABX started per primary.  Pt is afebrile, does not appear toxic.  ESRD - Received HD 8/4. AVG functioned without issues. Plan to remove TDC and not replace. Awaiting VVS clerance on removal. Next HD 12/14/2022 using L AVG Hypertension/volume  - BP controlled. No evidence of volume excess by exam or CXR. UF as tolerated.  Anemia  - HGB 10.8. Recent ESA dose  12/07/2022. Hold venofer in setting of possible infection. Follow HGB.  Metabolic bone disease -  Last PO4 9.5 on admission now 2.5! Binder dose lowered. Corrected calcium high. Decrease VDRA dose.  Nutrition - Last Albumin 2.9 12/04/2022. Add protein supplements.    Kathleen Holmes, NP [facility] 12/13/2022,1:25 PM  LOS: 2 days

## 2022-12-14 ENCOUNTER — Inpatient Hospital Stay (HOSPITAL_COMMUNITY): Payer: Medicare HMO

## 2022-12-14 DIAGNOSIS — T827XXD Infection and inflammatory reaction due to other cardiac and vascular devices, implants and grafts, subsequent encounter: Secondary | ICD-10-CM | POA: Diagnosis not present

## 2022-12-14 HISTORY — PX: IR REMOVAL TUN CV CATH W/O FL: IMG2289

## 2022-12-14 LAB — AEROBIC CULTURE W GRAM STAIN (SUPERFICIAL SPECIMEN)
Culture: NO GROWTH
Gram Stain: NONE SEEN

## 2022-12-14 LAB — HEPATITIS B SURFACE ANTIBODY, QUANTITATIVE: Hep B S AB Quant (Post): 8.3 m[IU]/mL — ABNORMAL LOW

## 2022-12-14 MED ORDER — PENTAFLUOROPROP-TETRAFLUOROETH EX AERO: 1.0000 | INHALATION_SPRAY | CUTANEOUS | Status: DC | PRN

## 2022-12-14 MED ORDER — VANCOMYCIN HCL 500 MG/100ML IV SOLN
500.0000 mg | INTRAVENOUS | Status: DC
  Administered 2022-12-14: 500 mg via INTRAVENOUS
  Filled 2022-12-14: qty 100

## 2022-12-14 MED ORDER — LIDOCAINE HCL 1 % IJ SOLN
INTRAMUSCULAR | Status: AC
  Filled 2022-12-14: qty 20

## 2022-12-14 MED ORDER — ANTICOAGULANT SODIUM CITRATE 4% (200MG/5ML) IV SOLN: 5.0000 mL | Status: DC | PRN

## 2022-12-14 MED ORDER — LIDOCAINE-PRILOCAINE 2.5-2.5 % EX CREA: 1.0000 | TOPICAL_CREAM | CUTANEOUS | Status: DC | PRN

## 2022-12-14 MED ORDER — ALTEPLASE 2 MG IJ SOLR: 2.0000 mg | Freq: Once | INTRAMUSCULAR | Status: DC | PRN

## 2022-12-14 MED ORDER — HEPARIN SODIUM (PORCINE) 1000 UNIT/ML DIALYSIS: 1000.0000 [IU] | INTRAMUSCULAR | Status: DC | PRN

## 2022-12-14 MED ORDER — LIDOCAINE HCL (PF) 1 % IJ SOLN: 5.0000 mL | INTRAMUSCULAR | Status: DC | PRN

## 2022-12-14 MED ORDER — HEPARIN SODIUM (PORCINE) 1000 UNIT/ML DIALYSIS
3600.0000 [IU] | Freq: Once | INTRAMUSCULAR | Status: AC
  Administered 2022-12-14: 3600 [IU] via INTRAVENOUS_CENTRAL
  Filled 2022-12-14: qty 4

## 2022-12-14 NOTE — Progress Notes (Signed)
Received patient in bed.Awake.alert and oriented x 4.Consent verified.  Medicines given: Heparin 3,600 units pre-reun dose.                             Hectorol  2 mcg.                             Vancomycin 500 mg IV.  Acces used : Left upper arm AVG that worked well.  Medicine given: Heparin 3600 units pre-run dose.  Duration of treatment: 3.5 hours .  Fluid removed :1.6 liters.  Hemo comment: Tolerated treatment.  Hand off to the patient's nurse.

## 2022-12-14 NOTE — TOC CM/SW Note (Signed)
Transition of Care [facility]) - Inpatient Brief Assessment   Patient Details  Name: Kathleen [name] MRN: [id number] Date of Birth: [birth year]  Transition of Care [facility]) CM/SW Contact:    Tom-Johnson, Hershal Coria, RN Phone Number: 12/14/2022, 4:31 PM   Clinical Narrative:  Patient presented to the ED with Pus and blood from Lt Arm and Soreness. Admitted for HD Cath Infection. On IV abx.  Patient has Hx of ESRD, on TTS outpatient dialysis schedule. Nephrology following for inpatient dialysis.  Patient underwent removal of Tunneled Dialysis Catheter today 12/14/22 by IR.   No TOC needs or recommendations noted at this time.  CM will continue to follow as patient progresses with care towards discharge.     Transition of Care Asessment: Insurance and Status: Insurance coverage has been reviewed Patient has primary care physician: Yes Home environment has been reviewed: Yes Prior level of function:: Independent Prior/Current Home Services: No current home services Social Determinants of Health Reivew: SDOH reviewed no interventions necessary Readmission risk has been reviewed: Yes Transition of care needs: no transition of care needs at this time

## 2022-12-14 NOTE — Plan of Care (Signed)

## 2022-12-14 NOTE — Progress Notes (Signed)
Kathleen [name]  ZOX:[id number] DOB: 12/09/1960 DOA: 12/11/2022 PCP: Hildred Priest, NP    Brief Narrative:  62 year old with a history of SLE and ESRD on HD TTS via a right subclavian tunneled HD catheter who presented to the ER 8/3 with pain and tenderness at the insertion site of her catheter with exam revealing a purulent discharge and concern for an acute catheter infection.  Goals of Care:   Code Status: Full Code   DVT prophylaxis: heparin injection 5,000 Units Start: 12/11/22 1530 SCDs Start: 12/11/22 1525  Interim Hx: Afebrile.  Vital signs stable.  No new complaints today.  In good spirits.  Headed down to IR for her [facility] catheter removal.  She  Assessment & Plan:  [facility] catheter superficial/exit site infection Presently this appears to be a superficial infection with no evidence of bacteremia thus far -continue empiric broad-spectrum antibiotic -blood cultures no growth thus far -HD catheter to be removed today with blessing of Vasc Surgery -if her catheter is removed without difficulty, she does well through the night, and her blood cultures remain negative she could potentially be discharged home 8/7  ESRD on HD Nephrology attending to this issue -her subclavian TDC was placed 12/02/2022 during admission for a malfunctioning AVG -at this time her AVG appears to be functional as it was successfully used for dialysis early a.m. 8/4 -cleared to continue using AVG per vascular surgery evaluation  SLE Biopsy-proven lupus nephritis -does not appear to require chronic immunosuppressive medications  Family Communication: No family present Disposition: Plan for discharge home, possibly 8/7 as discussed above   Objective: Blood pressure 115/71, pulse 73, temperature 98.2 F (36.8 C), resp. rate 15, weight 54.4 kg, SpO2 100%.  Intake/Output Summary (Last 24 hours) at 12/14/2022 1010 Last data filed at 12/13/2022 2228 Gross per 24 hour  Intake 240 ml  Output 1 ml  Net 239 ml    Filed Weights   12/12/22 0206 12/14/22 0812  Weight: 51.7 kg 54.4 kg    Examination: General: No acute respiratory distress Lungs: Clear bilaterally  Cardiovascular: RRR Extremities: No significant edema bilateral lower extremities  CBC: Recent Labs  Lab 12/11/22 1115 12/11/22 1602 12/12/22 0419 12/14/22 0347  WBC 7.2 6.0 5.9 5.7  NEUTROABS 4.0  --   --  2.2  HGB 11.4* 8.9* 10.8* 10.8*  HCT 39.2 32.1* 35.2* 35.4*  MCV 98.5 102.9* 92.9 94.1  PLT 314 207 250 231   Basic Metabolic Panel: Recent Labs  Lab 12/11/22 1115 12/11/22 1602 12/12/22 0419 12/14/22 0347  NA 138  --  131* 128*  K 4.6  --  3.4* 5.0  CL 105  --  95* 88*  CO2 19*  --  25 26  GLUCOSE 88  --  76 82  BUN 20  --  7* 32*  CREATININE 10.89* 9.84* 5.61* 11.27*  CALCIUM 9.9  --  8.8* 9.4  PHOS  --   --  2.5 3.8   GFR: Estimated Creatinine Clearance: 4.4 mL/min (A) (by C-G formula based on SCr of 11.27 mg/dL (H)).   Scheduled Meds:  Chlorhexidine Gluconate Cloth  6 each Topical Q0600   cinacalcet  60 mg Oral Q T,Th,Sa-HD   doxercalciferol  2 mcg Intravenous Q T,Th,Sa-HD   heparin  3,600 Units Dialysis Once in dialysis   heparin  5,000 Units Subcutaneous Q8H   multivitamin  1 tablet Oral QHS   pantoprazole  40 mg Oral BID   sevelamer carbonate  2,400 mg Oral TID  WC   sodium chloride flush  3 mL Intravenous Q12H     LOS: 3 days   Lonia Blood, MD [facility] Office  [phone] Pager - Text Page per Amion  If 7PM-7AM, please contact night-coverage per Amion 12/14/2022, 10:10 AM

## 2022-12-14 NOTE — Procedures (Signed)
Interventional Radiology Procedure Note  Risks and benefits of removal of hemodialysis catheter were discussed with the patient including, but not limited to bleeding, hematoma, infection, damage to adjacent structures.  All of the patient's questions were answered, patient is agreeable to proceed. Consent signed and in chart. PROCEDURE SUMMARY:  Successful removal of tunneled dialysis catheter.  No complications.   EBL = trace  Please see full dictation in imaging section of Epic for procedure details.  Alene Mires NP 12/14/2022 2:54 PM

## 2022-12-14 NOTE — Plan of Care (Signed)
  Problem: Education: Goal: Knowledge of General Education information will improve Description: Including pain rating scale, medication(s)/side effects and non-pharmacologic comfort measures 12/14/2022 1948 by Irwin Brakeman, RN Outcome: Progressing 12/14/2022 1526 by Irwin Brakeman, RN Outcome: Progressing   Problem: Health Behavior/Discharge Planning: Goal: Ability to manage health-related needs will improve 12/14/2022 1948 by Irwin Brakeman, RN Outcome: Progressing 12/14/2022 1526 by Irwin Brakeman, RN Outcome: Progressing   Problem: Clinical Measurements: Goal: Ability to maintain clinical measurements within normal limits will improve 12/14/2022 1948 by Irwin Brakeman, RN Outcome: Progressing 12/14/2022 1526 by Irwin Brakeman, RN Outcome: Progressing Goal: Will remain free from infection 12/14/2022 1948 by Irwin Brakeman, RN Outcome: Progressing 12/14/2022 1526 by Irwin Brakeman, RN Outcome: Progressing Goal: Diagnostic test results will improve 12/14/2022 1948 by Irwin Brakeman, RN Outcome: Progressing 12/14/2022 1526 by Irwin Brakeman, RN Outcome: Progressing Goal: Respiratory complications will improve 12/14/2022 1948 by Irwin Brakeman, RN Outcome: Progressing 12/14/2022 1526 by Irwin Brakeman, RN Outcome: Progressing Goal: Cardiovascular complication will be avoided 12/14/2022 1948 by Irwin Brakeman, RN Outcome: Progressing 12/14/2022 1526 by Irwin Brakeman, RN Outcome: Progressing   Problem: Activity: Goal: Risk for activity intolerance will decrease 12/14/2022 1948 by Irwin Brakeman, RN Outcome: Progressing 12/14/2022 1526 by Irwin Brakeman, RN Outcome: Progressing   Problem: Nutrition: Goal: Adequate nutrition will be maintained 12/14/2022 1948 by Irwin Brakeman, RN Outcome: Progressing 12/14/2022 1526 by Irwin Brakeman, RN Outcome:  Progressing   Problem: Coping: Goal: Level of anxiety will decrease 12/14/2022 1948 by Irwin Brakeman, RN Outcome: Progressing 12/14/2022 1526 by Irwin Brakeman, RN Outcome: Progressing   Problem: Elimination: Goal: Will not experience complications related to bowel motility 12/14/2022 1948 by Irwin Brakeman, RN Outcome: Progressing 12/14/2022 1526 by Irwin Brakeman, RN Outcome: Progressing Goal: Will not experience complications related to urinary retention 12/14/2022 1948 by Irwin Brakeman, RN Outcome: Progressing 12/14/2022 1526 by Irwin Brakeman, RN Outcome: Progressing   Problem: Pain Managment: Goal: General experience of comfort will improve 12/14/2022 1948 by Irwin Brakeman, RN Outcome: Progressing 12/14/2022 1526 by Irwin Brakeman, RN Outcome: Progressing   Problem: Safety: Goal: Ability to remain free from injury will improve 12/14/2022 1948 by Irwin Brakeman, RN Outcome: Progressing 12/14/2022 1526 by Irwin Brakeman, RN Outcome: Progressing   Problem: Skin Integrity: Goal: Risk for impaired skin integrity will decrease 12/14/2022 1948 by Irwin Brakeman, RN Outcome: Progressing 12/14/2022 1526 by Irwin Brakeman, RN Outcome: Progressing

## 2022-12-14 NOTE — Progress Notes (Signed)
[facility] Progress Note   Subjective:    Seen and examined patient on HD. Tolerating UFG 1.5L. No acute issues. BP is 128/78. Discussed with VVS PA yesterday and placed order for IR to remove TDC.  Objective Vitals:   12/14/22 0423 12/14/22 0812 12/14/22 0827 12/14/22 0900  BP: 123/84 105/68 127/79 128/78  Pulse: 81 77 74 73  Resp: 18 18 16 13   Temp: 98.4 F (36.9 C) 98.2 F (36.8 C)    TempSrc:      SpO2: 100% 100% 100% 100%  Weight:  54.4 kg     Physical Exam General: Very pleasant older female in no acute distress. Lungs: CTAB No WOB Heart: RRR with S1 S2. No murmurs, rubs, or gallops appreciated. Abdomen: NABS, NT, ND Lower extremities:without edema or ischemic changes, no open wounds  Neuro: Alert and oriented X 3. Moves all extremities spontaneously. Dialysis Access:L AVG + T/B. RIJ TDC Drsg CDI  [facility] Weights   12/12/22 0206 12/14/22 0812  Weight: 51.7 kg 54.4 kg    Intake/Output Summary (Last 24 hours) at 12/14/2022 0953 Last data filed at 12/13/2022 2228 Gross per 24 hour  Intake 240 ml  Output 1 ml  Net 239 ml    Additional Objective Labs: Basic Metabolic Panel: Recent Labs  Lab 12/11/22 1115 12/11/22 1602 12/12/22 0419 12/14/22 0347  NA 138  --  131* 128*  K 4.6  --  3.4* 5.0  CL 105  --  95* 88*  CO2 19*  --  25 26  GLUCOSE 88  --  76 82  BUN 20  --  7* 32*  CREATININE 10.89* 9.84* 5.61* 11.27*  CALCIUM 9.9  --  8.8* 9.4  PHOS  --   --  2.5 3.8   Liver Function Tests: Recent Labs  Lab 12/12/22 0419 12/14/22 0347  AST 24  --   ALT 11  --   ALKPHOS 62  --   BILITOT 0.5  --   PROT 6.7  --   ALBUMIN 2.9* 2.8*   No results for input(s): "LIPASE", "AMYLASE" in the last 168 hours. CBC: Recent Labs  Lab 12/11/22 1115 12/11/22 1602 12/12/22 0419 12/14/22 0347  WBC 7.2 6.0 5.9 5.7  NEUTROABS 4.0  --   --  2.2  HGB 11.4* 8.9* 10.8* 10.8*  HCT 39.2 32.1* 35.2* 35.4*  MCV 98.5 102.9* 92.9 94.1  PLT 314 207 250 231    Blood Culture    Component Value Date/Time   SDES PUSTULE 12/11/2022 1520   SPECREQUEST NONE 12/11/2022 1520   CULT  12/11/2022 1520    NO GROWTH 2 DAYS Performed at [facility], [address], [place], [place] [address]    REPTSTATUS PENDING 12/11/2022 1520    Cardiac Enzymes: No results for input(s): "CKTOTAL", "CKMB", "CKMBINDEX", "TROPONINI" in the last 168 hours. CBG: No results for input(s): "GLUCAP" in the last 168 hours. Iron Studies: No results for input(s): "IRON", "TIBC", "TRANSFERRIN", "FERRITIN" in the last 72 hours. Lab Results  Component Value Date   INR 1.1 12/12/2022   INR 1.1 12/11/2022   INR 1.2 12/03/2022   Studies/Results: No results found.  Medications:  anticoagulant sodium citrate     vancomycin      Chlorhexidine Gluconate Cloth  6 each Topical Q0600   cinacalcet  60 mg Oral Q T,Th,Sa-HD   doxercalciferol  2 mcg Intravenous Q T,Th,Sa-HD   heparin  3,600 Units Dialysis Once in dialysis   heparin  5,000  Units Subcutaneous Q8H   multivitamin  1 tablet Oral QHS   pantoprazole  40 mg Oral BID   sevelamer carbonate  2,400 mg Oral TID WC   sodium chloride flush  3 mL Intravenous Q12H    Dialysis Orders: [place], T,Th,S 3.5 hrs 160NRe 350/Autoflow 1.5 50.3 kg 2.0 K/2.0 Ca AVG/TDC - Heparin 3600 units IV three times per week - Hectorol 3 mcg IV three times per week - Cinacalcet 60 mg PO three times per week - Mircera 100 mcg IV q 2 weeks (last dose 12/07/2022) - Venofer 50 mg IV weekly  Assessment/Plan: Possible TDC exit site infection: BC/WC NG < 24 hrs. No Leukocytosis. Empiric ABX started per primary.  Pt is afebrile, does not appear toxic.  ESRD - Received HD 8/4. On HD and AVG continues to function well without issues. Discussed with VVS PA yesterday and placed order for IR to remove TDC. Hypertension/volume  - BP controlled. No evidence of volume excess by exam or CXR. UF as tolerated.  Anemia  - HGB 10.8. Recent ESA dose  12/07/2022. Hold venofer in setting of possible infection. Follow HGB.  Metabolic bone disease -  Binder dose lowered and phos now to goal. Corrected calcium high. Decrease VDRA dose. Monitor trend. Nutrition - Last Albumin 2.9 12/04/2022. Add protein supplements.   Salome Holmes, NP [facility] 12/14/2022,9:53 AM  LOS: 3 days

## 2022-12-14 NOTE — Progress Notes (Addendum)
Pharmacy Antibiotic Note  Kathleen [name] is a 62 y.o. female for which pharmacy has been consulted for vancomycin dosing for  HD catheter site infection . Patient with a history of ESRD on HD w/ rt chest wall permacath (placed ~1 week ago). HD RN noted purulent drainage from cath at HD - planning to remove R internal jugular TDC this admit. AVG not infected per VVS.  Vancomycin random obtained this AM = 20 mcg/mL which is therapeutic (goal 15-25 mcg/mL). Planning HD today.  Plan: Vancomycin 500mg  IV qHD TTS Monitor WBC, fever, renal function, cultures  Weight: 51.7 kg (113 lb 15.7 oz)  Temp (24hrs), Avg:98.4 F (36.9 C), Min:98.2 F (36.8 C), Max:98.6 F (37 C)  Recent Labs  Lab 12/11/22 1115 12/11/22 1239 12/11/22 1602 12/11/22 1609 12/12/22 0419 12/14/22 0347  WBC 7.2  --  6.0  --  5.9 5.7  CREATININE 10.89*  --  9.84*  --  5.61* 11.27*  LATICACIDVEN  --  1.9  --  0.8  --   --   VANCORANDOM  --   --   --   --   --  20    Estimated Creatinine Clearance: 4.2 mL/min (A) (by C-G formula based on SCr of 11.27 mg/dL (H)).    No Known Allergies  Microbiology results: 8/3 Bcx: ngtd 8/3 Pustule Cx: ngtd  Thank you for allowing pharmacy to be a part of this patient's care.  Rexford Maus, PharmD, BCPS 12/14/2022 7:57 AM

## 2022-12-15 ENCOUNTER — Other Ambulatory Visit (HOSPITAL_COMMUNITY): Payer: Self-pay

## 2022-12-15 DIAGNOSIS — T827XXA Infection and inflammatory reaction due to other cardiac and vascular devices, implants and grafts, initial encounter: Secondary | ICD-10-CM | POA: Diagnosis not present

## 2022-12-15 MED ORDER — AMOXICILLIN-POT CLAVULANATE 500-125 MG PO TABS
1.0000 | ORAL_TABLET | Freq: Every day | ORAL | 0 refills | Status: AC
  Filled 2022-12-15: qty 10, 10d supply, fill #0

## 2022-12-15 MED ORDER — DOXYCYCLINE HYCLATE 100 MG PO TABS
100.0000 mg | ORAL_TABLET | Freq: Two times a day (BID) | ORAL | 0 refills | Status: AC
  Filled 2022-12-15: qty 20, 10d supply, fill #0

## 2022-12-15 NOTE — Progress Notes (Signed)
D/C order noted. Contacted [facility] to advise clinic of pt's d/c today and that pt should resume care tomorrow.   Olivia Canter Renal Navigator [phone]

## 2022-12-15 NOTE — Plan of Care (Signed)
[facility] Patient Discharge Orders- [facility] CLINIC: [facility]  Patient's name: Dalaila Cena Admit/DC Dates: 12/11/2022 - 12/15/2022  Discharge Diagnoses: [facility] infection/purulent drainage noted, blood cultures NGTD, patient didn't appear toxic, TDC removed 8/6, on 10-day course PO ABXs.    Aranesp: Given: No  Last Hgb: 10.8 PRBC's Given: No ESA dose for discharge: mircera 100 mcg IV q 2 weeks  Hold Fe while patient is on 10-day course ABXs. She can resume Fe load when course is complete!  Heparin change: No  EDW Change: No  [place] Change: No  Access intervention/Change: Yes Details: TDC removed 8/6 for possible infection but that was r/o, blood cultures NGTD, AVG was working well here  Hectorol change: No  Discharge Labs: Calcium 9.4 Phosphorus 3.8  Albumin 2.8    K+ 5.0  IV Antibiotics: Yes Details: Concern for TDC infection but that's been r/o!Marland Kitchen Blood cultures NGTD, TDC removed 8/6. Patient on 10-day course PO ABXs. Dose is renally appropriate  On Coumadin?: No    OTHER/APPTS/LAB ORDERS: Please check K+ level weekly X 3. Last K+ here was 5.   D/C Meds to be reconciled by nurse after every discharge.  Completed By: Salome Holmes, NP   Reviewed by: MD:______ RN_______

## 2022-12-15 NOTE — Discharge Summary (Signed)
**Note De-Identified vi Obfusction** Physicin Dischrge Summry  Bonniejen Summerton BJY:[id number] DOB: 1961-03-25 DOA: 12/11/2022  PCP: Hildred Priest, NP  Admit dte: 12/11/2022 Dischrge dte: 12/15/2022  Time spent: 40 minutes  Recommendtions for Outptient Follow-up:  Follow outptient CBC/CMP  Follow improvement on orl ntibiotics  Follow with vsculr nd renl outptient  Dischrge Dignoses:  Principl Problem:   Hemodilysis ctheter infection [facility])   Dischrge Condition: stble  Diet recommendtion: renl   Filed Weights   12/12/22 0206 12/14/22 0812 12/14/22 1242  Weight: 51.7 kg 54.4 kg 51.3 kg    History of present illness:   61 yer old with  history of SLE nd ESRD on HD TTS vi  right subclvin tunneled HD ctheter who presented to the ER 8/3 with pin nd tenderness t the insertion site of her ctheter with exm reveling  purulent dischrge nd concern for n cute ctheter infection.   Blood cultures were negtive.  Her TDC hs been removed nd her AVG hs been clered for use.  See below for dditionl detils  Hospitl Course:  Assessment nd Pln:  [facility] ctheter superficil/exit site infection No systemic infection identified.  Blood cultures NGTDx4.  Superficil culture Ngx2. R chest tunneled dilysis ctheter removed 8/6 Will pln to dischrge on orl ntibiotics   ESRD on HD Nephrology ttending to this issue -her subclvin TDC ws plced 12/02/2022 during dmission for  mlfunctioning AVG -t this time her AVG ppers to be functionl s it ws successfully used for dilysis erly .m. 8/4 -clered to continue using AVG per vsculr surgery evlution Follow outptient with vsculr    SLE Biopsy-proven lupus nephritis -does not pper to require chronic immunosuppressive medictions  PAD Mild RLE rteril disese with bnorml toe brchil index on ABI - mild LLE rteril disese with bnorml toe brchil index      Procedures:  8/6 IMPRESSION: Successful removl  of  RIGHT chest tunneled dilysis ctheter.  Consulttions: Renl IR vsculr  Dischrge Exm: Vitls:   12/15/22 0535 12/15/22 0828  BP: 119/89 97/77  Pulse: 81 85  Resp: 19 18  Temp: 98.6 F (37 C) 98.7 F (37.1 C)  SpO2: 100% 100%   No complints  Generl: No cute distress. Dressing to upper R chest, mild TTP, no cler redness or swelling Crdiovsculr: RRR Lungs: unlbroed Neurologicl: Alert nd oriented 3. Moves ll extremities 4 with equl strength. Crnil nerves II through XII grossly intct. Extremities: No clubbing or cynosis. No edem.   Dischrge Instructions   Dischrge Instructions     Cll MD for:  difficulty brething, hedche or visul disturbnces   Complete by: As directed    Cll MD for:  extreme ftigue   Complete by: As directed    Cll MD for:  hives   Complete by: As directed    Cll MD for:  persistnt dizziness or light-hededness   Complete by: As directed    Cll MD for:  persistnt nuse nd vomiting   Complete by: As directed    Cll MD for:  redness, tenderness, or signs of infection (pin, swelling, redness, odor or green/yellow dischrge round incision site)   Complete by: As directed    Cll MD for:  severe uncontrolled pin   Complete by: As directed    Cll MD for:  temperture >100.4   Complete by: As directed    Diet - low sodium hert helthy   Complete by: As directed    Dischrge instructions   Complete by: As directed    You were **Note De-Identified vi Obfusction** seen for n infection relted to your tunneled dilysis ctheter.   You hd no blood strem infection, which is good.  We'll dischrge you on ntibiotics.    They removed your ctheter nd will use your grft going forwrd for dilysis.  Return for new, recurrent, or worsening symptoms.  Plese sk your PCP to request records from this hospitliztion so they know wht ws done nd wht the next steps will be.   Increse ctivity slowly   Complete by: s directed        llergies s of 12/15/2022   No Known llergies      Mediction List     TKE these medictions    cetminophen 160 MG/5ML liquid Commonly known s: TYLENOL Tke 320 mg by mouth every 6 (six) hours s needed for fever or pin.   moxicillin-clvulnte 500-125 MG tblet Commonly known s: ugmentin Tke 1 tblet by mouth dily for 10 dys. Tke in the fternoon fter dilysis   cinclcet 30 MG tblet Commonly known s: SENSIPR Tke 2 tblets (60 mg totl) by mouth every Tuesdy, Thursdy, nd Sturdy t 6 PM.   doxycycline 100 MG cpsule Commonly known s: VIBRMYCIN Tke 1 cpsule (100 mg totl) by mouth 2 (two) times dily for 10 dys.   ferrous sulfte 325 (65 FE) MG tblet Tke 325 mg by mouth dily.   multivitmin Tbs tblet Tke 1 tblet by mouth dily.   pntoprzole 40 MG tblet Commonly known s: Protonix Tke 1 tblet (40 mg totl) by mouth 2 (two) times dily.   sevelmer crbonte 800 MG tblet Commonly known s: RENVEL Tke 800-2,400 mg by mouth See dmin instructions. 2400 mg three times dily with mels, 800 mg with sncks.       No Known llergies    The results of significnt dignostics from this hospitliztion (including imging, microbiology, ncillry nd lbortory) re listed below for reference.    Significnt Dignostic Studies: IR Removl Tun Cv Cth W/O FL  Result Dte: 12/14/2022 INDICTION: 52 yer old femle. History of end-stge renl disese with tunneled hemodilysis ctheter. Tem is concerned for infection due to cuff exposure. Request is for tunneled dilysis ctheter removl EXM: REMOVL TUNNELED CENTRL VENOUS CTHETER MEDICTIONS: No ntibiotic ws indicted for this procedure. Moderte (conscious) sedtion ws not employed during this procedure. FLUOROSCOPY TIME:  None COMPLICTIONS: None immedite. PROCEDURE: Informed written consent ws obtined from the ptient fter  thorough discussion of the procedurl risks,  benefits nd lterntives. ll questions were ddressed. Mximl Sterile Brrier Technique ws utilized including cps, msk, sterile gowns, sterile gloves, sterile drpe, hnd hygiene nd skin ntiseptic.  timeout ws performed prior to the initition of the procedure. The ptient's right chest nd ctheter ws prepped nd drped in  norml sterile fshion. Heprin ws removed from both ports of ctheter. The cuff of the ctheter ws exposed nd the ctheter ws exposed. Thus being ble to remove in it's entirety using gentle trction. Pressure ws held till hemostsis ws obtined.  sterile dressing ws pplied. The ptient tolerted the procedure well with no immedite complictions. IMPRESSION: Successful removl of  RIGHT chest tunneled dilysis ctheter. Red by: nders Grnt, NP Electroniclly Signed   By: Ronn Bnning M.D.   On: 12/14/2022 15:23   DG Chest Port 1 View  Result Dte: 12/11/2022 CLINICL DT:  Questionble sepsis - evlute for bnormlity EXM: PORTBLE CHEST 1 VIEW COMPRISON:  CXR 10/27/22 FINDINGS: No pleurl effusion. No pneumothorx. Right-sided centrl venous ctheter with tip in the right trium. **Note De-Identified vi Obfusction** Norml crdic nd medistinl contours. No focl irspce opcity. No rdiogrphiclly pprent displced rib frctures. Visulized upper bdomen is unremrkble. IMPRESSION: No cute crdiopulmonry process. Electroniclly Signed   By: Lorenz Cmbridge M.D.   On: 12/11/2022 12:42   VS Kore BI WITH/WO TBI  Result Dte: 12/06/2022  LOWER EXTREMITY DOPPLER STUDY Ptient Nme:  Smie Flegel  Dte of Exm:   12/05/2022 Medicl Rec #: [id number]           ccession #:    [id number] Dte of Birth: [birth year]           Ptient Gender: F Ptient ge:   38 yers Exm [facility]:  [facility] Procedure:      VS Kore BI WITH/WO TBI Referring Phys: Cristl Deer DICKSON --------------------------------------------------------------------------------  Indictions: Pre opertive exm  prior to thigh grft High Risk Fctors: Hypertension, current smoker. Other Fctors: ESRD on dilysis, Lupus, CHF.  Vsculr Interventions: Left upper extremity V grft plcement 11/27/19 nd                         07/29/20, right V fistul plcement 10/27/16. Limittions: Tody's exm ws limited secondry to insufficient mount of              subcutneous tissue in foot to llow ultrsound wves to penetrte.              (BMI 18). Comprison Study: Prior BI 04/23/20 Performing Technologist: Sherren Kerns RVS  Exmintion Guidelines:  complete evlution includes t minimum, Doppler wveform signls nd systolic blood pressure reding t the level of bilterl brchil, nterior tibil, nd posterior tibil rteries, when vessel segments re ccessible. Bilterl testing is considered n integrl prt of  complete exmintion. Photoelectric Plethysmogrph (PPG) wveforms nd toe systolic pressure redings re included s required nd dditionl duplex testing s needed. Limited exmintions for reoccurring indictions my be performed s noted.  BI Findings: +---------+------------------+-----+-----------+--------+ Right    Rt Pressure (mmHg)IndexWveform   Comment  +---------+------------------+-----+-----------+--------+ Brchil 96                     Multiphsic         +---------+------------------+-----+-----------+--------+ PT      85                0.89 multiphsic         +---------+------------------+-----+-----------+--------+ DP       78                0.81                     +---------+------------------+-----+-----------+--------+ Gret Toe0                 0.00 bsent              +---------+------------------+-----+-----------+--------+ +---------+------------------+-----+-----------+----------------+ Left     Lt Pressure (mmHg)IndexWveform   Comment          +---------+------------------+-----+-----------+----------------+ Brchil                                    restricted/grft +---------+------------------+-----+-----------+----------------+ PT      81                0.84 multiphsic                 +---------+------------------+-----+-----------+----------------+ DP       78 **Note De-Identified vi Obfusction** 0.81 multiphsic                 +---------+------------------+-----+-----------+----------------+ Gret Toe0                 0.00 bsent                      +---------+------------------+-----+-----------+----------------+ +-------+-----------+-----------+------------+-------------------------+ BI/TBITody's BITody's TBIPrevious BIPrevious TBI              +-------+-----------+-----------+------------+-------------------------+ Right  0.89       bsent     0.99        low mplitude of wveform +-------+-----------+-----------+------------+-------------------------+ Left   0.84       bsent     0.99        low mplitude of wveform +-------+-----------+-----------+------------+-------------------------+ Bilterl BIs pper decresed compred to prior study on 04/23/20. Bilterl TBIs pper essentilly unchnged compred to prior study on 04/23/20.  [place]: Right: Resting right nkle-brchil index indictes mild right lower extremity rteril disese. The right toe-brchil index is bnorml. Left: Resting left nkle-brchil index indictes mild left lower extremity rteril disese. The left toe-brchil index is bnorml. *See tble(s) bove for mesurements nd observtions.  Electroniclly signed by Gerrd Frction on 12/06/2022 t 6:01:35 PM.    Finl    VS Kore DUPLEX DILYSIS CCESS (VF, VG)  Result Dte: 12/05/2022 DILYSIS CCESS Ptient Nme:  HSTI VUGHNS  Dte of Exm:   12/04/2022 Medicl Rec #: [id number]           ccession #:    [id number] Dte of Birth: 11-ug-1962           Ptient Gender: F Ptient ge:   29 yers Exm [facility]:  [facility] Procedure:      VS Kore DUPLEX DILYSIS CCESS  (VF, VG) Referring Phys: Hzeline Junker --------------------------------------------------------------------------------  Reson for Exm: Pin in xill. ccess Site: Left Upper Extremity. ccess Type: Upper rm loop VG. History: Left upper rm pin with recently declotting of VG. Nephrology lso          notes decresed thrill tody. On 12/02/22 CK vsculr ttempted declot,          but ptient ws in too much pin, so TDC ws plced. Ptient hd          thrombectomy by IR 12/03/22, but suffered hemorrhge post procedure. Limittions: Severe pin Comprison Study: Prior study done 10/13/2020 Performing Technologist: Sherren Kerns RVS  Exmintion Guidelines:  complete evlution includes B-mode imging, spectrl Doppler, color Doppler, nd power Doppler s needed of ll ccessible portions of ech vessel. Unilterl testing is considered n integrl prt of  complete exmintion. Limited exmintions for reoccurring indictions my be performed s noted.  Findings:    [place]: rteriovenous grft-Ultrsound chrcteristics suggest probble thrombus noted in grft in xill t site of severe pin. Velocities pper diminished throughout VG.   Dignosing physicin: Wverly Ferrri MD Electroniclly signed by Wverly Ferrri MD on 12/05/2022 t 10:32:02 M.    --------------------------------------------------------------------------------   Finl    IR Kore Guide Vsc ccess Left  Result Dte: 12/03/2022 INDICTION: 67 yer old femle with  history of left upper extremity dilysis circuit thrombosis. She presents for ttempt t declot EXM: ULTRSOUND-GUIDED CCESS LEFT UPPER EXTREMITY DILYSIS CIRCUIT X2 FISTULGRM MECHNICL ND PHRMCOLOGIC THROMBECTOMY OF LEFT UPPER EXTREMITY DILYSIS CIRCUIT BLLOON NGIOPLSTY WITH DRUG-ELUTING BLLOON FOR TRETMENT OF VENOUS OUTFLOW STENOSIS MEDICTIONS: 5 mg tP 3000 units IV heprin NESTHESI/SEDTION: Moderte (conscious) sedtion ws employed during this  procedure.  totl of Versed **Note De-Identified vi Obfusction** 1.5 mg nd Fentnyl 75 mcg ws dministered intrvenously by the rdiology nurse. Totl intr-service moderte Sedtion Time: 48 minutes. The ptient's level of consciousness nd vitl signs were monitored continuously by rdiology nursing throughout the procedure under my direct supervision. FLUOROSCOPY: Rdition Exposure Index (s provided by the fluoroscopic device): 5.8 mGy Kerm COMPLICTIONS: None PROCEDURE: The procedure, risks, benefits, nd lterntives were explined to the ptient nd the ptient's fmily, including bleeding, infection, rteril thrombus, venous thrombus, vessel injury, need for further procedure, need for stenting, contrst rection, need for ctheter plcement, crdiopulmonry collpse, deth. Questions regrding the procedure were encourged nd nswered. The ptient understnds nd consents to the procedure. The left upper extremity ws prepped nd drped in the usul sterile fshion Ultrsound survey ws performed with imges stored nd sent to PCs. The ultrsound demonstrtes evidence of 2 djcent dilysis circuit. One of these hs clerly been excluded fter  second surgery. The echogenic mteril within the lumen of the current circuit ws used to identify the proper puncture site. This ppers to represent  brchil-brchil dilysis circuit/grft. Ultrsound guidnce ws used to ccess the upper extremity grft with  micropuncture kit, directed centrlly. Exchnge ws mde for  7 Jmic short sheth. Ultrsound guidnce ws then used ccess the fistul directed towrd the rteril nstomosis. Ws removed nd the micro wire ws left in plce.  combintion of both  Glidewire nd  Bentson wire were used to nvigte  short Kumpe the ctheter into the subclvin vein. Venogrm performed confirming no significnt centrl stenosis. Bentson wire ws plced nd initil 6 mm x 40 mm blloon ngioplsty ws performed in the stenotic xillry vein,  which is identified s the mjor site of filure. Kumpe the ctheter ws then pssed on the Bentson wire nd the wire ws removed. Upon withdrwl of the ngled ctheter,  solution of 3 milligrms of tP in sline ws infused into the outflow portion of the grft. The Kumpe the ctheter ws then nvigted gin centri with combintion of Glidewire nd  Bentson wire.  6 mm blloon ctheter ws used to mcerte the thrombosed segment. The micro sheth ws then plced on the wire directed towrds the rteril nstomosis. This micropuncture set ws then used to plce  Bentson wire nd  6 Jmic short sheth ws plced. Glidewire ws nvigted cross the rteril nstomosis, with n over the wire Fogrty blloon dvnced into the rtery. The rteril plug ws pulled, with some difficulty withdrwing the Fogrty blloon through the rteril nstomosis.  stenosis ws presumed t the rteril nstomosis. We then inflted  4 mm x 40 mm blloon, identifying no significnt stenosis. ngiogrm through the blloon ctheter confirmed no significnt stenosis t the rteril nstomosis. t this point there hs been restortion of flow through the grft. Repet blloon ngioplsty ws then performed t the venous outflow, 7 mm x 40 mm blloon ngioplsty t the proximl brchil vein, nd the xillry vein. fter stndrd blloon ngioplsty, we treted the entire segment with  7 mm x 60 mm drug-eluting blloon.  3 minute time intervl ws observed. Repet ngiogrm ws performed. Ctheters nd wires were removed. No residul stenosis ws identified in the venous outflow. Excellent thrill ws confirmed t the conclusion. The short sheths were removed fter plcement of hemostsis sutures. Ptient tolerted the procedure well nd remined hemodynmiclly stble throughout. No complictions were encountered nd no significnt blood loss ws encountered. FINDINGS: Dilysis circuit thrombosed on initil imges. The  ptient hs old circuit which hs been surgiclly excluded. The updted **Note De-Identified vi Obfusction** circuit hs  typicl flow pttern, which is counter clockwise from the brchil rtery in the xillry region, towrds the shoulder. The mode of filure ws  criticl stenosis in the xillry vein/brchil vein outflow, which were treted with 7 mm blloon ngioplsty nd  drug-eluting blloon 7 mm. No residul stenosis on completion. Excellent thrill on completion. IMPRESSION: Sttus post ultrsound guided ccess of left upper extremity brchil-brchil dilysis grft, for phrmcologic nd mechnicl thrombectomy to restore flow fter previous thrombosis. Venous outflow stenosis ws treted with 7 mm drug-eluting blloon ngioplsty to no residul stenosis, nd restortion of excellent thrill. Signed, Yvone Neu. Michel Roux, RPVI Vsculr nd Interventionl Rdiology Specilists [facility] Rdiology CCESS: This ccess remins menble to future percutneous interventions s cliniclly indicted. Electroniclly Signed   By: Gilmer Mor D.O.   On: 12/03/2022 12:21   IR THROMBECTOMY V FISTUL W/THROMBOLYSIS/PT INC/SHUNT/IMG LEFT  Result Dte: 12/03/2022 INDICTION: 84 yer old femle with  history of left upper extremity dilysis circuit thrombosis. She presents for ttempt t declot EXM: ULTRSOUND-GUIDED CCESS LEFT UPPER EXTREMITY DILYSIS CIRCUIT X2 FISTULGRM MECHNICL ND PHRMCOLOGIC THROMBECTOMY OF LEFT UPPER EXTREMITY DILYSIS CIRCUIT BLLOON NGIOPLSTY WITH DRUG-ELUTING BLLOON FOR TRETMENT OF VENOUS OUTFLOW STENOSIS MEDICTIONS: 5 mg tP 3000 units IV heprin NESTHESI/SEDTION: Moderte (conscious) sedtion ws employed during this procedure.  totl of Versed 1.5 mg nd Fentnyl 75 mcg ws dministered intrvenously by the rdiology nurse. Totl intr-service moderte Sedtion Time: 48 minutes. The ptient's level of consciousness nd vitl signs were monitored continuously by rdiology nursing throughout the  procedure under my direct supervision. FLUOROSCOPY: Rdition Exposure Index (s provided by the fluoroscopic device): 5.8 mGy Kerm COMPLICTIONS: None PROCEDURE: The procedure, risks, benefits, nd lterntives were explined to the ptient nd the ptient's fmily, including bleeding, infection, rteril thrombus, venous thrombus, vessel injury, need for further procedure, need for stenting, contrst rection, need for ctheter plcement, crdiopulmonry collpse, deth. Questions regrding the procedure were encourged nd nswered. The ptient understnds nd consents to the procedure. The left upper extremity ws prepped nd drped in the usul sterile fshion Ultrsound survey ws performed with imges stored nd sent to PCs. The ultrsound demonstrtes evidence of 2 djcent dilysis circuit. One of these hs clerly been excluded fter  second surgery. The echogenic mteril within the lumen of the current circuit ws used to identify the proper puncture site. This ppers to represent  brchil-brchil dilysis circuit/grft. Ultrsound guidnce ws used to ccess the upper extremity grft with  micropuncture kit, directed centrlly. Exchnge ws mde for  7 Jmic short sheth. Ultrsound guidnce ws then used ccess the fistul directed towrd the rteril nstomosis. Ws removed nd the micro wire ws left in plce.  combintion of both  Glidewire nd  Bentson wire were used to nvigte  short Kumpe the ctheter into the subclvin vein. Venogrm performed confirming no significnt centrl stenosis. Bentson wire ws plced nd initil 6 mm x 40 mm blloon ngioplsty ws performed in the stenotic xillry vein, which is identified s the mjor site of filure. Kumpe the ctheter ws then pssed on the Bentson wire nd the wire ws removed. Upon withdrwl of the ngled ctheter,  solution of 3 milligrms of tP in sline ws infused into the outflow portion of the grft. The Kumpe the  ctheter ws then nvigted gin centri with combintion of Glidewire nd  Bentson wire.  6 mm blloon ctheter ws used to mcerte the thrombosed segment. The micro sheth ws then plced on the wire directed towrds the rteril nstomosis. **Note De-Identified vi Obfusction** This micropuncture set ws then used to plce  Bentson wire nd  6 Jmic short sheth ws plced. Glidewire ws nvigted cross the rteril nstomosis, with n over the wire Fogrty blloon dvnced into the rtery. The rteril plug ws pulled, with some difficulty withdrwing the Fogrty blloon through the rteril nstomosis.  stenosis ws presumed t the rteril nstomosis. We then inflted  4 mm x 40 mm blloon, identifying no significnt stenosis. ngiogrm through the blloon ctheter confirmed no significnt stenosis t the rteril nstomosis. t this point there hs been restortion of flow through the grft. Repet blloon ngioplsty ws then performed t the venous outflow, 7 mm x 40 mm blloon ngioplsty t the proximl brchil vein, nd the xillry vein. fter stndrd blloon ngioplsty, we treted the entire segment with  7 mm x 60 mm drug-eluting blloon.  3 minute time intervl ws observed. Repet ngiogrm ws performed. Ctheters nd wires were removed. No residul stenosis ws identified in the venous outflow. Excellent thrill ws confirmed t the conclusion. The short sheths were removed fter plcement of hemostsis sutures. Ptient tolerted the procedure well nd remined hemodynmiclly stble throughout. No complictions were encountered nd no significnt blood loss ws encountered. FINDINGS: Dilysis circuit thrombosed on initil imges. The ptient hs old circuit which hs been surgiclly excluded. The updted circuit hs  typicl flow pttern, which is counter clockwise from the brchil rtery in the xillry region, towrds the shoulder. The mode of filure ws  criticl stenosis in the xillry vein/brchil vein  outflow, which were treted with 7 mm blloon ngioplsty nd  drug-eluting blloon 7 mm. No residul stenosis on completion. Excellent thrill on completion. IMPRESSION: Sttus post ultrsound guided ccess of left upper extremity brchil-brchil dilysis grft, for phrmcologic nd mechnicl thrombectomy to restore flow fter previous thrombosis. Venous outflow stenosis ws treted with 7 mm drug-eluting blloon ngioplsty to no residul stenosis, nd restortion of excellent thrill. Signed, Yvone Neu. Michel Roux, RPVI Vsculr nd Interventionl Rdiology Specilists [facility] Rdiology CCESS: This ccess remins menble to future percutneous interventions s cliniclly indicted. Electroniclly Signed   By: Gilmer Mor D.O.   On: 12/03/2022 12:21    Microbiology: Recent Results (from the pst 240 hour(s))  Blood Culture (routine x 2)     Sttus: None (Preliminry result)   Collection Time: 12/11/22 12:05 PM   Specimen: BLOOD RIGHT HND  Result Vlue Ref Rnge Sttus   Specimen Description BLOOD RIGHT HND  Finl   Specil Requests   Finl    BOTTLES DRWN EROBIC ONLY Blood Culture dequte volume   Culture   Finl    NO GROWTH 4 DYS Performed t [facility], [address] [place], [place] [address]    Report Sttus PENDING  Incomplete  Blood Culture (routine x 2)     Sttus: None (Preliminry result)   Collection Time: 12/11/22 12:10 PM   Specimen: BLOOD  Result Vlue Ref Rnge Sttus   Specimen Description BLOOD RIGHT NTECUBITL  Finl   Specil Requests   Finl    BOTTLES DRWN EROBIC ND NEROBIC Blood Culture dequte volume   Culture   Finl    NO GROWTH 4 DYS Performed t [facility], [address], [place], [place] [address]    Report Sttus PENDING  Incomplete  erobic Culture w Grm Stin (superficil specimen)     Sttus: None   Collection Time: 12/11/22  3:20 PM   Specimen: Pustule  Result Vlue Ref Rnge Sttus Specimen  Description PUSTULE  Final   Special Requests NONE  Final   Gram Stain NO WBC SEEN NO ORGANISMS SEEN   Final   Culture   Final    NO GROWTH 2 DAYS Performed at [facility], [address] [place], [place] [address]    Report Status 12/14/2022 FINAL  Final     Labs: Basic Metabolic Panel: Recent Labs  Lab 12/11/22 1115 12/11/22 1602 12/12/22 0419 12/14/22 0347  NA 138  --  131* 128*  K 4.6  --  3.4* 5.0  CL 105  --  95* 88*  CO2 19*  --  25 26  GLUCOSE 88  --  76 82  BUN 20  --  7* 32*  CREATININE 10.89* 9.84* 5.61* 11.27*  CALCIUM 9.9  --  8.8* 9.4  PHOS  --   --  2.5 3.8   Liver Function Tests: Recent Labs  Lab 12/12/22 0419 12/14/22 0347  AST 24  --   ALT 11  --   ALKPHOS 62  --   BILITOT 0.5  --   PROT 6.7  --   ALBUMIN 2.9* 2.8*   No results for input(s): "LIPASE", "AMYLASE" in the last 168 hours. No results for input(s): "AMMONIA" in the last 168 hours. CBC: Recent Labs  Lab 12/11/22 1115 12/11/22 1602 12/12/22 0419 12/14/22 0347  WBC 7.2 6.0 5.9 5.7  NEUTROABS 4.0  --   --  2.2  HGB 11.4* 8.9* 10.8* 10.8*  HCT 39.2 32.1* 35.2* 35.4*  MCV 98.5 102.9* 92.9 94.1  PLT 314 207 250 231   Cardiac Enzymes: No results for input(s): "CKTOTAL", "CKMB", "CKMBINDEX", "TROPONINI" in the last 168 hours. BNP: BNP (last 3 results) No results for input(s): "BNP" in the last 8760 hours.  ProBNP (last 3 results) No results for input(s): "PROBNP" in the last 8760 hours.  CBG: No results for input(s): "GLUCAP" in the last 168 hours.     Signed:  Lacretia Nicks MD.  [facility] 12/15/2022, 1:47 PM

## 2022-12-15 NOTE — Care Management Important Message (Signed)
Important Message  Patient Details  Name: Kathleen [name] MRN: [id number] Date of Birth: Feb 20, 1961   Medicare Important Message Given:  Yes     Dorena Bodo 12/15/2022, 2:46 PM

## 2022-12-15 NOTE — TOC Transition Note (Signed)
Transition of Care [facility]) - CM/SW Discharge Note   Patient Details  Name: Kathleen [name] MRN: [id number] Date of Birth: 11/04/1960  Transition of Care [facility]) CM/SW Contact:  Tom-Johnson, Hershal Coria, RN Phone Number: 12/15/2022, 2:15 PM   Clinical Narrative:     Patient is scheduled for discharge today.  Readmission Risk Assessment done. Hospital f/u and discharge instructions on AVS. Prescriptions sent to [facility] pharmacy and meds will be delivered to patient at bedside prior discharge. No TOC needs or recommendations noted. Significant other, John to transport at discharge.  No further TOC needs noted.        Final next level of care: Home/Self Care Barriers to Discharge: Barriers Resolved   Patient Goals and CMS Choice CMS Medicare.gov Compare Post Acute Care list provided to:: Patient Choice offered to / list presented to : NA  Discharge Placement                  Patient to be transferred to facility by: Family      Discharge Plan and Services Additional resources added to the After Visit Summary for                              [facility] Agency: NA        Social Determinants of Health (SDOH) Interventions SDOH Screenings   Food Insecurity: No Food Insecurity (12/11/2022)  Housing: Low Risk  (12/11/2022)  Transportation Needs: No Transportation Needs (12/11/2022)  Utilities: Not At Risk (12/11/2022)  Tobacco Use: High Risk (12/03/2022)     Readmission Risk Interventions    12/14/2022    4:30 PM 12/06/2022    9:57 AM 09/09/2021   12:39 PM  Readmission Risk Prevention Plan  Transportation Screening Complete Complete Complete  PCP or Specialist Appt within 3-5 Days Complete Complete   HRI or Home Care Consult Complete Complete   Social Work Consult for Recovery Care Planning/Counseling Complete --   Palliative Care Screening Not Applicable Not Applicable   Medication Review [facility]) Referral to Pharmacy Complete Complete  PCP or Specialist  appointment within 3-5 days of discharge   Complete  HRI or Home Care Consult   Complete  SW Recovery Care/Counseling Consult   Complete  Palliative Care Screening   Not Applicable  Skilled Nursing Facility   Not Applicable

## 2022-12-15 NOTE — Progress Notes (Signed)
[facility] Progress Note   Subjective:    Seen and examined patient at bedside. She reports burning eyes and sneezing. Sounds like allergies. Otherwise, no acute issues. Tolerated yesterday's HD with net UF 1.6L. TDC removed yesterday. Spoke with Hospitalist. Plan for discharge today.  Objective Vitals:   12/14/22 1533 12/14/22 2110 12/15/22 0535 12/15/22 0828  BP: 106/79 106/78 119/89 97/77  Pulse: 99 89 81 85  Resp: 18 19 19 18   Temp: 100 F (37.8 C) 98.1 F (36.7 C) 98.6 F (37 C) 98.7 F (37.1 C)  TempSrc:      SpO2: 100% 100% 100% 100%  Weight:       Physical Exam General: Very pleasant older female in no acute distress. Lungs: CTAB No WOB Heart: RRR with S1 S2. No murmurs, rubs, or gallops appreciated. Abdomen: NABS, NT, ND Lower extremities:without edema or ischemic changes, no open wounds  Neuro: Alert and oriented X 3. Moves all extremities spontaneously. Dialysis Access:L AVG + T/B. RIJ TDC Drsg CDI  Filed Weights   12/12/22 0206 12/14/22 0812 12/14/22 1242  Weight: 51.7 kg 54.4 kg 51.3 kg    Intake/Output Summary (Last 24 hours) at 12/15/2022 1438 Last data filed at 12/15/2022 1300 Gross per 24 hour  Intake 1220.17 ml  Output 0 ml  Net 1220.17 ml    Additional Objective Labs: Basic Metabolic Panel: Recent Labs  Lab 12/11/22 1115 12/11/22 1602 12/12/22 0419 12/14/22 0347  NA 138  --  131* 128*  K 4.6  --  3.4* 5.0  CL 105  --  95* 88*  CO2 19*  --  25 26  GLUCOSE 88  --  76 82  BUN 20  --  7* 32*  CREATININE 10.89* 9.84* 5.61* 11.27*  CALCIUM 9.9  --  8.8* 9.4  PHOS  --   --  2.5 3.8   Liver Function Tests: Recent Labs  Lab 12/12/22 0419 12/14/22 0347  AST 24  --   ALT 11  --   ALKPHOS 62  --   BILITOT 0.5  --   PROT 6.7  --   ALBUMIN 2.9* 2.8*   No results for input(s): "LIPASE", "AMYLASE" in the last 168 hours. CBC: Recent Labs  Lab 12/11/22 1115 12/11/22 1602 12/12/22 0419 12/14/22 0347  WBC 7.2 6.0 5.9 5.7   NEUTROABS 4.0  --   --  2.2  HGB 11.4* 8.9* 10.8* 10.8*  HCT 39.2 32.1* 35.2* 35.4*  MCV 98.5 102.9* 92.9 94.1  PLT 314 207 250 231   Blood Culture    Component Value Date/Time   SDES PUSTULE 12/11/2022 1520   SPECREQUEST NONE 12/11/2022 1520   CULT  12/11/2022 1520    NO GROWTH 2 DAYS Performed at [facility], [address], [place], [place] [address]    REPTSTATUS 12/14/2022 FINAL 12/11/2022 1520    Cardiac Enzymes: No results for input(s): "CKTOTAL", "CKMB", "CKMBINDEX", "TROPONINI" in the last 168 hours. CBG: No results for input(s): "GLUCAP" in the last 168 hours. Iron Studies: No results for input(s): "IRON", "TIBC", "TRANSFERRIN", "FERRITIN" in the last 72 hours. Lab Results  Component Value Date   INR 1.1 12/12/2022   INR 1.1 12/11/2022   INR 1.2 12/03/2022   Studies/Results: IR Removal Tun Cv Cath W/O FL  Result Date: 12/14/2022 INDICATION: 62 year old female. History of end-stage renal disease with tunneled hemodialysis catheter. Team is concerned for infection due to cuff exposure. Request is for tunneled dialysis catheter removal EXAM: REMOVAL TUNNELED  CENTRAL VENOUS CATHETER MEDICATIONS: No antibiotic was indicated for this procedure. Moderate (conscious) sedation was not employed during this procedure. FLUOROSCOPY TIME:  None COMPLICATIONS: None immediate. PROCEDURE: Informed written consent was obtained from the patient after a thorough discussion of the procedural risks, benefits and alternatives. All questions were addressed. Maximal Sterile Barrier Technique was utilized including caps, mask, sterile gowns, sterile gloves, sterile drape, hand hygiene and skin antiseptic. A timeout was performed prior to the initiation of the procedure. The patient's right chest and catheter was prepped and draped in a normal sterile fashion. Heparin was removed from both ports of catheter. The cuff of the catheter was exposed and the catheter was exposed. Thus being able to  remove in it's entirety using gentle traction. Pressure was held till hemostasis was obtained. A sterile dressing was applied. The patient tolerated the procedure well with no immediate complications. IMPRESSION: Successful removal of a RIGHT chest tunneled dialysis catheter. Read by: Anders Grant, NP Electronically Signed   By: Roanna Banning M.D.   On: 12/14/2022 15:23    Medications:  vancomycin Stopped (12/14/22 1115)    Chlorhexidine Gluconate Cloth  6 each Topical Q0600   cinacalcet  60 mg Oral Q T,Th,Sa-HD   doxercalciferol  2 mcg Intravenous Q T,Th,Sa-HD   heparin  5,000 Units Subcutaneous Q8H   multivitamin  1 tablet Oral QHS   pantoprazole  40 mg Oral BID   sevelamer carbonate  2,400 mg Oral TID WC   sodium chloride flush  3 mL Intravenous Q12H    Dialysis Orders: [place], T,Th,S 3.5 hrs 160NRe 350/Autoflow 1.5 50.3 kg 2.0 K/2.0 Ca AVG/TDC - Heparin 3600 units IV three times per week - Hectorol 3 mcg IV three times per week - Cinacalcet 60 mg PO three times per week - Mircera 100 mcg IV q 2 weeks (last dose 12/07/2022) - Venofer 50 mg IV weekly  Assessment/Plan: Possible TDC exit site infection: BC/WC NG < 24 hrs. No Leukocytosis. Empiric ABX started per primary.  Pt is afebrile, does not appear toxic.  ESRD - Received HD 8/4. On HD and AVG continues to function well without issues. TDC removed yesterday by IR. Hypertension/volume  - BP controlled. No evidence of volume excess by exam or CXR. UF as tolerated.  Anemia  - HGB 10.8. Recent ESA dose 12/07/2022. Hold venofer in setting of possible infection. Follow HGB.  Metabolic bone disease -  Binder dose lowered and phos now to goal. Corrected calcium high. Decrease VDRA dose. Monitor trend. Nutrition - Last Albumin 2.9 12/04/2022. Add protein supplements.  Dispo - Okay for discharge from a renal standpoint.   Salome Holmes, NP [facility] 12/15/2022,2:38 PM  LOS: 4 days

## 2022-12-15 NOTE — Progress Notes (Signed)
Pt discharged home in stable condition. Discharge instructions received. Pt verbalized understanding. Prescribed meds provided to pt. No immediate questions or concerns at this time. Discharged from unit to DC lounge via wheelchair

## 2022-12-16 LAB — CULTURE, BLOOD (ROUTINE X 2)
Culture: NO GROWTH
Culture: NO GROWTH
Special Requests: ADEQUATE
Special Requests: ADEQUATE

## 2022-12-16 NOTE — TOC Transition Note (Signed)
Transition of Care - Initial Contact from Inpatient Facility  Date of discharge: 12/15/22 Date of contact: 12/16/22  Method: Phone Spoke to: Patient  Patient contacted to discuss transition of care from recent inpatient hospitalization. Patient was admitted to [facility] from 12/11/22-12/15/22 with discharge diagnosis of superficial/exit site infection of TDC.  The discharge medication list was reviewed. Patient understands the changes and has no concerns.   Patient missed her HD at [facility] unfortunately (she reports missing the phone call). I strongly advised her to ensure she makes her next HD tomorrow. She verbalized understanding.  Salome Holmes, NP

## 2023-01-04 ENCOUNTER — Ambulatory Visit: Payer: Medicare HMO | Admitting: Internal Medicine

## 2023-01-12 ENCOUNTER — Ambulatory Visit: Payer: Medicare HMO

## 2023-05-02 IMAGING — MR MR LUMBAR SPINE W/O CM
4 of 5 series · 18 of 48 positions shown · non-contrast
Comparison: None.

CLINICAL DATA: Neck pain, infection suspected, positive xray/CT
strep bacteremia; Mid-back pain strep bacteremia-r/o osteo/diskitis;
Low back pain, infection suspected, positive xray/CT strep
bacteremia-r/o osteo/diskitis

EXAM:
MRI CERVICAL, THORACIC AND LUMBAR SPINE WITHOUT CONTRAST
TECHNIQUE: Multiplanar and multiecho pulse sequences of the cervical spine, to
include the craniocervical junction and cervicothoracic junction,
and thoracic and lumbar spine, were obtained without intravenous
contrast.

[Series 19: T2 · sagittal · 4.0mm · 0.55mm/px · 6 of 15 slices shown (1 of 2)]
[im 1/15]
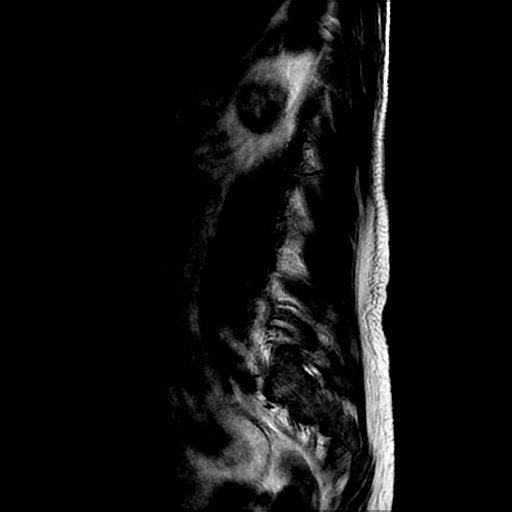
[im 3/15]
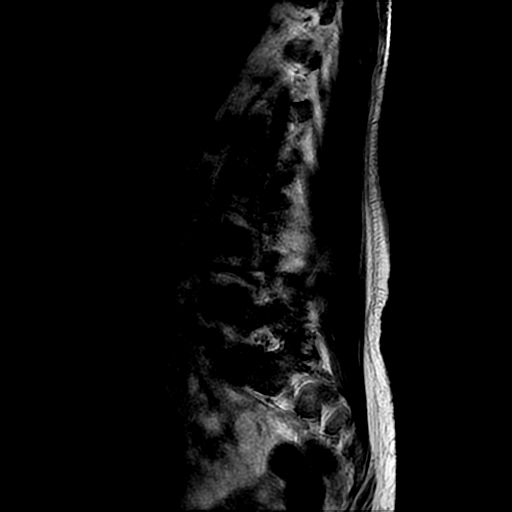
[im 6/15]
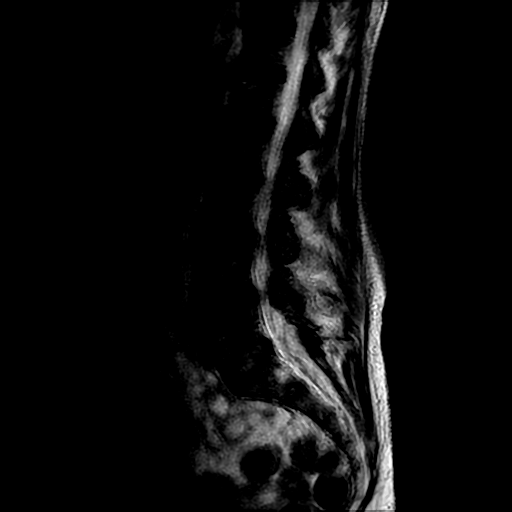
[im 9/15]
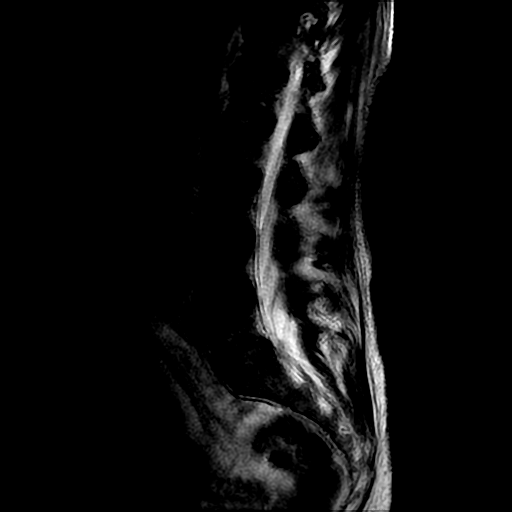
[im 12/15]
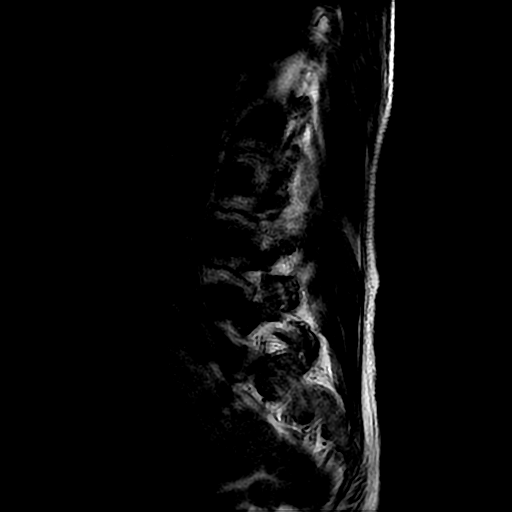
[im 15/15]
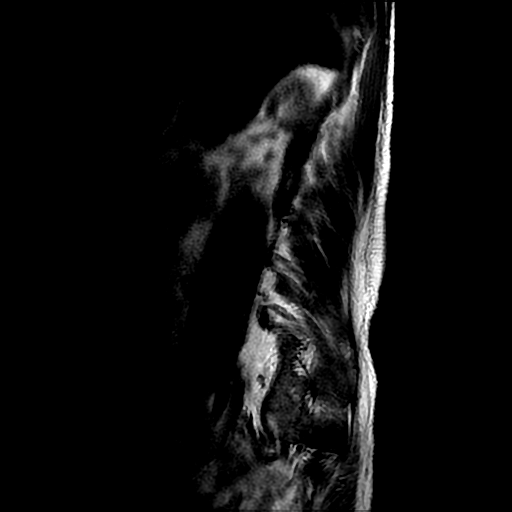

[Series 20: T1 · sagittal · 4.0mm · 0.55mm/px · 3 of 15 slices shown (1 of 2)]
[im 3/15]
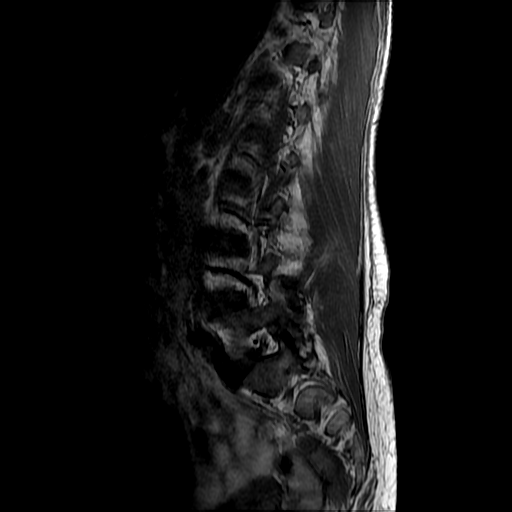
[im 9/15]
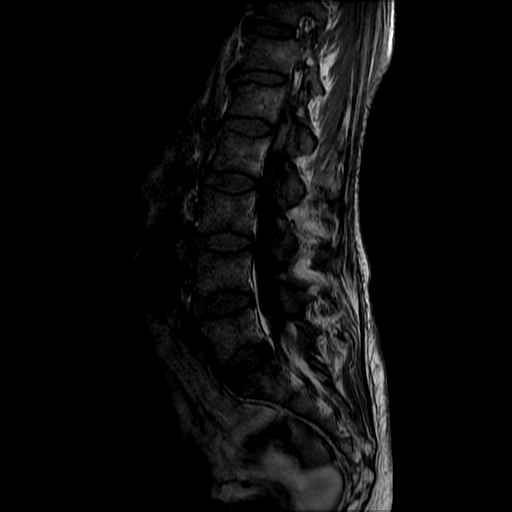
[im 15/15]
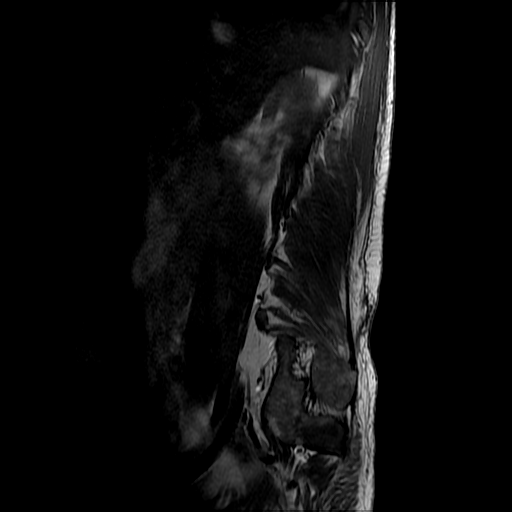

[Series 22: T2 · axial · 4.0mm · 0.39mm/px · z∈[-421,-255]mm · 6 of 37 slices shown (2 of 2)]
[im 1/37]
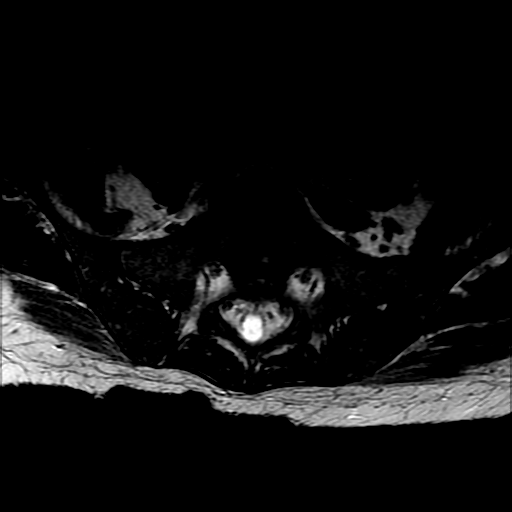
[im 6/37]
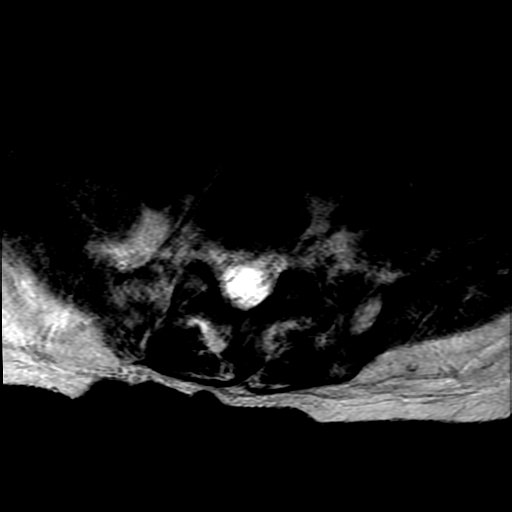
[im 11/37]
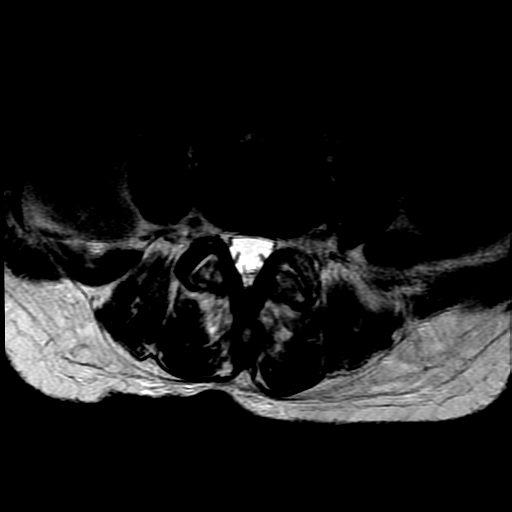
[im 16/37]
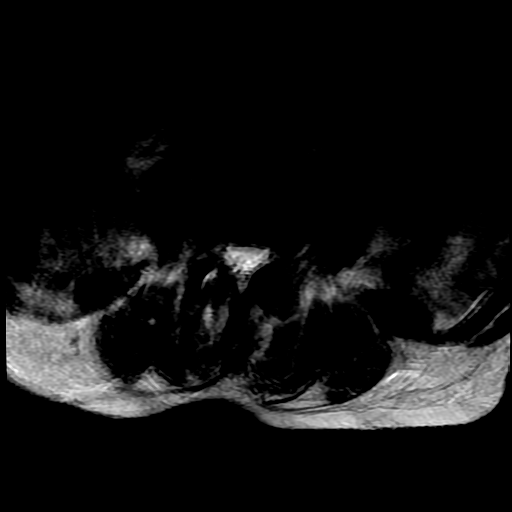
[im 19/37]
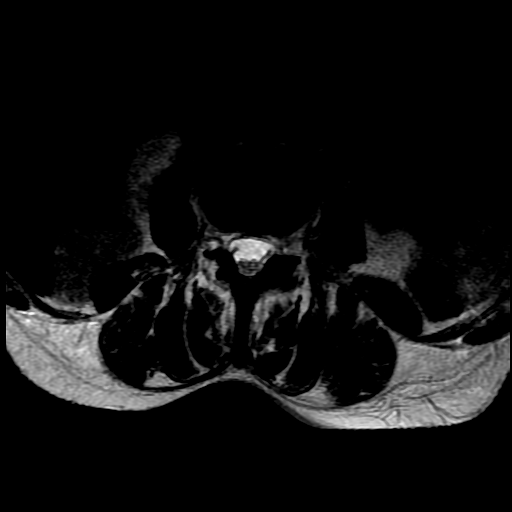
[im 31/37]
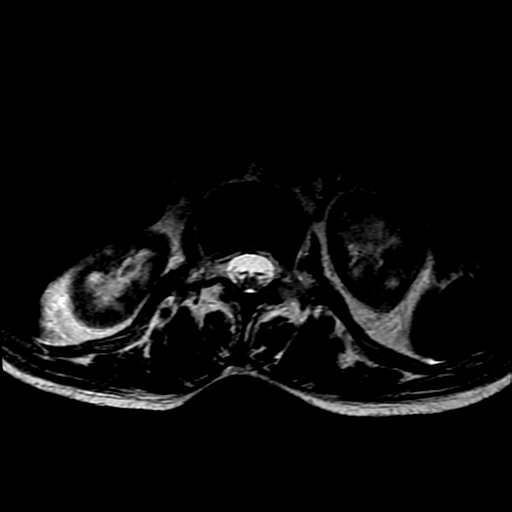

[Series 23: T1 · axial · 4.0mm · 0.39mm/px · z∈[-396,-255]mm · 3 of 37 slices shown (2 of 2)]
[im 6/37]
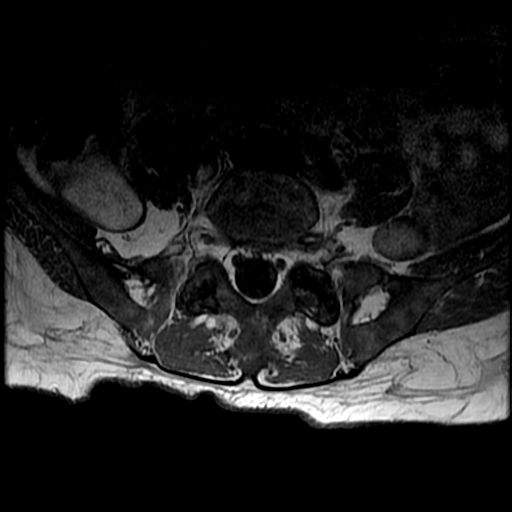
[im 19/37]
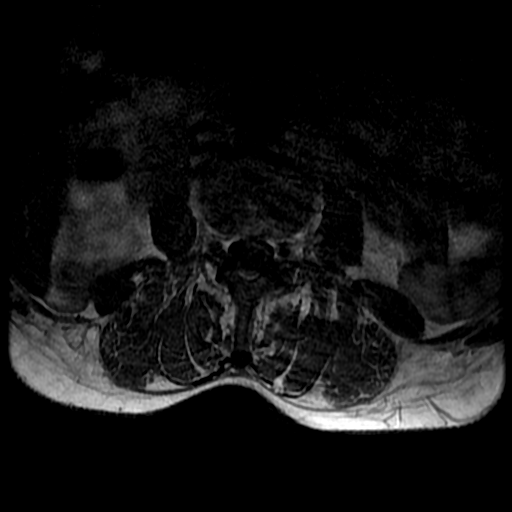
[im 31/37]
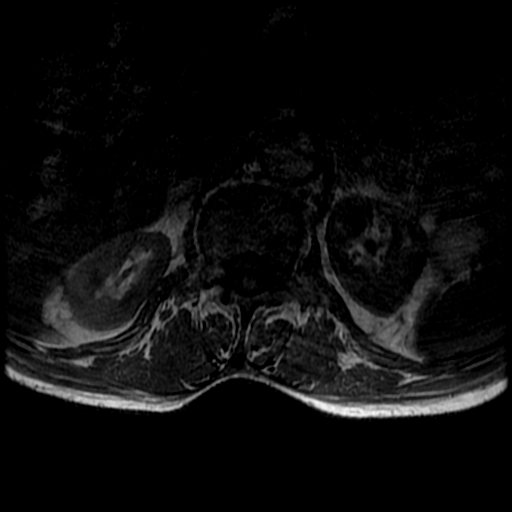

[18 of 48 positions shown; findings below may reference images not displayed]

FINDINGS: Motion artifact is present, greatest at lumbar levels.

MRI CERVICAL SPINE

Alignment: Trace anterolisthesis at C3-C4.

Vertebrae: Mild multilevel degenerative endplate irregularity. Mild,
likely degenerative endplate marrow edema at C3-C4.

Cord: No abnormal signal.

Posterior Fossa, vertebral arteries, paraspinal tissues: Parenchymal
volume loss. Probable small chronic infarct of the central pons.

Disc levels:

C2-C3:  No significant stenosis.

C3-C4: Anterolisthesis. Uncovered disc bulge with endplate
osteophytes. Mild canal and right foraminal stenosis. No left
foraminal stenosis.

C4-C5: Disc bulge with small left central disc protrusion. No canal
or foraminal stenosis.

C5-C6: Minimal disc bulge with endplate osteophytes. No canal or
foraminal stenosis.

C6-C7: Minimal disc bulge with endplate osteophytes. No canal or
foraminal stenosis.

C7-T1:  No stenosis.

MRI THORACIC SPINE

Alignment:  Preserved.

Vertebrae: Mild degenerative endplate irregularity. Schmorl's nodes
are present, for example inferior endplate T12 where there is mild
associated marrow edema.

Cord:  No abnormal signal.

Paraspinal and other soft tissues: 1.4 cm nodular lesion of the left
lower lobe.

Disc levels: Minor degenerative changes are present. There is no
canal or foraminal stenosis.

MRI LUMBAR SPINE

Segmentation:  Standard.

Alignment:  Preserved.

Vertebrae: Vertebral body heights are maintained. Mild marrow edema
associated with L4-L5 facet arthropathy.

Conus medullaris and cauda equina: Conus extends to the L1-L2 level.
Conus and cauda equina appear normal.

Paraspinal and other soft tissues: Unremarkable.

Disc levels: No canal or foraminal stenosis at any level. Lower
lumbar facet arthropathy is greatest at L4-L5 with there are joint
effusions.
IMPRESSION: Degraded by motion artifact, particularly at lumbar levels.

No evidence of discitis/osteomyelitis.

Degenerative changes without high-grade stenosis. Facet arthropathy
is greatest at L4-L5 where there are joint effusions; septic
arthritis is possible but is not considered likely.

1.4 cm left lower lobe nodular lesion.

## 2023-05-02 IMAGING — MR MR THORACIC SPINE W/O CM
4 of 6 series · 18 of 48 positions shown · non-contrast
Comparison: None.

CLINICAL DATA: Neck pain, infection suspected, positive xray/CT
strep bacteremia; Mid-back pain strep bacteremia-r/o osteo/diskitis;
Low back pain, infection suspected, positive xray/CT strep
bacteremia-r/o osteo/diskitis

EXAM:
MRI CERVICAL, THORACIC AND LUMBAR SPINE WITHOUT CONTRAST
TECHNIQUE: Multiplanar and multiecho pulse sequences of the cervical spine, to
include the craniocervical junction and cervicothoracic junction,
and thoracic and lumbar spine, were obtained without intravenous
contrast.

[Series 10: counting loc · sagittal · 4.0mm · 0.90mm/px · 1 of 5 slices shown]
[im 1/5]
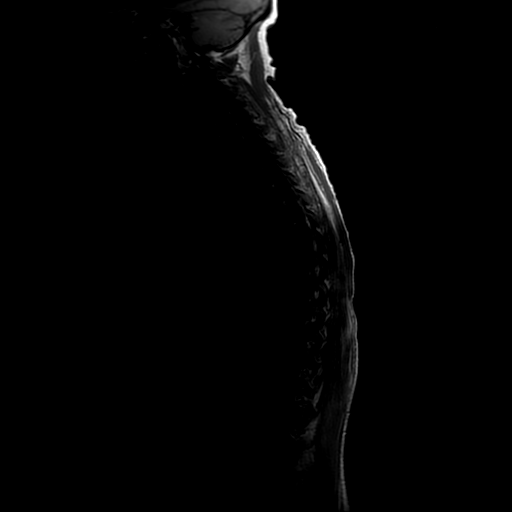

[Series 12: T2 · sagittal · 3.0mm · 0.55mm/px · 5 of 15 slices shown (1 of 2)]
[im 1/15]
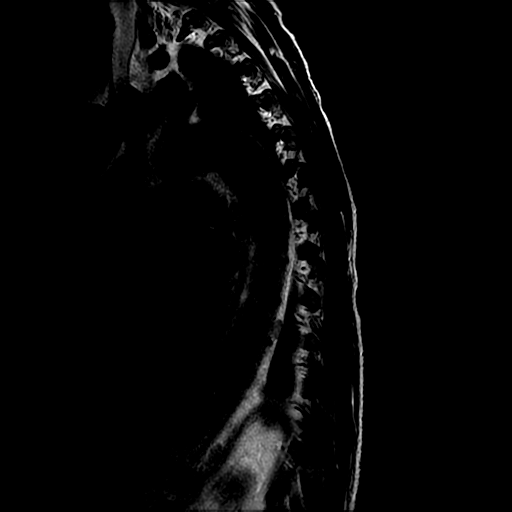
[im 4/15]
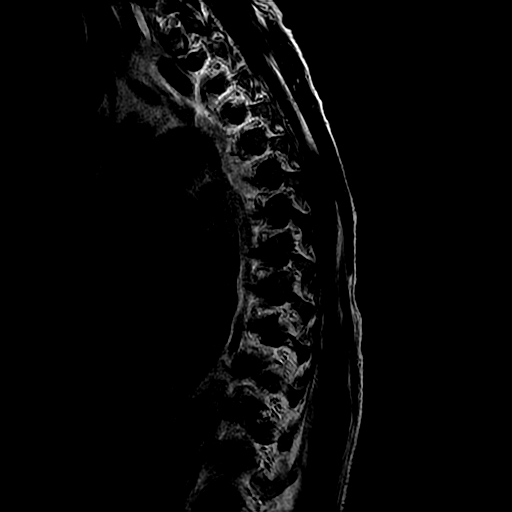
[im 8/15]
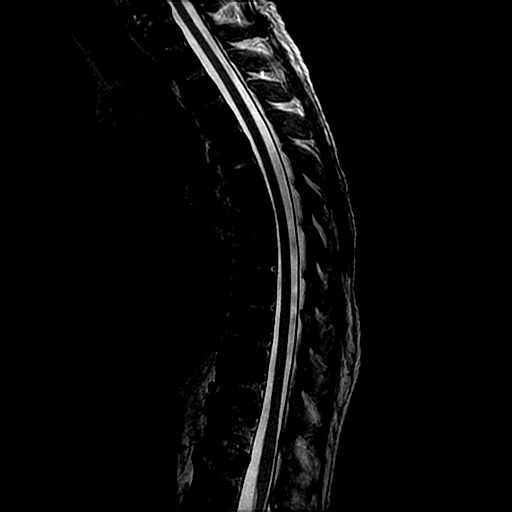
[im 11/15]
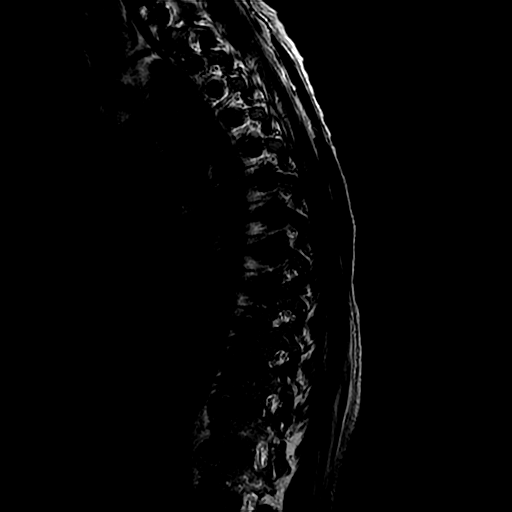
[im 15/15]
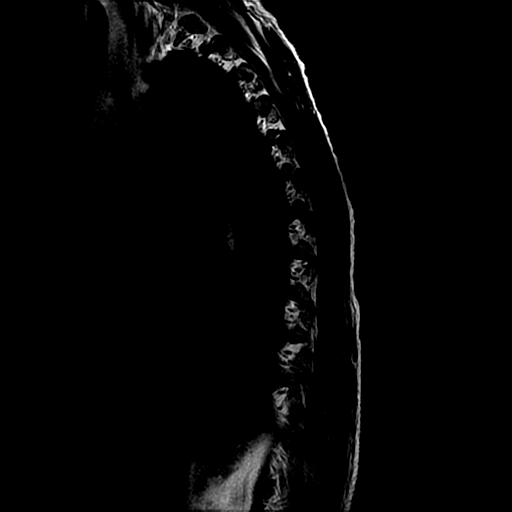

[Series 13: T1 · sagittal · 3.0mm · 0.62mm/px · 3 of 15 slices shown]
[im 3/15]
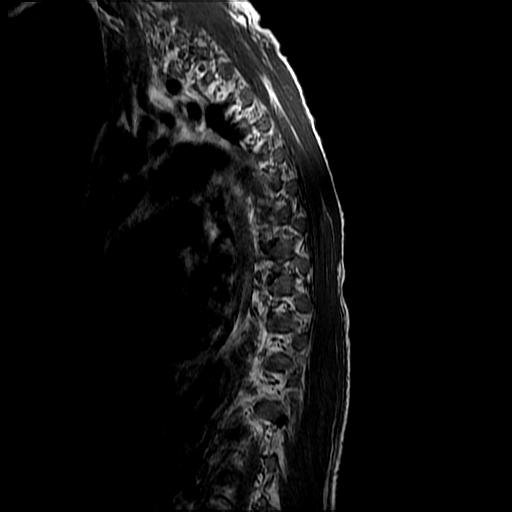
[im 9/15]
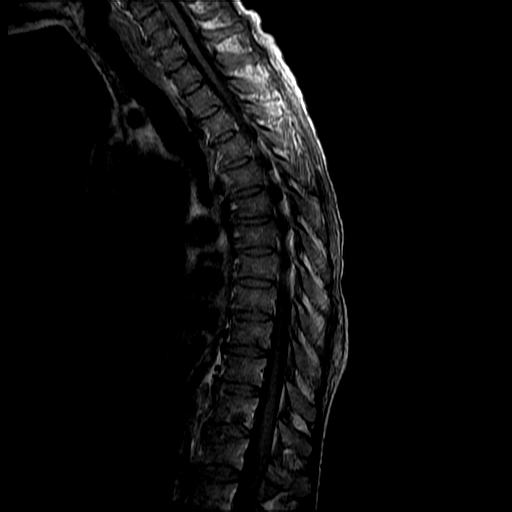
[im 15/15]
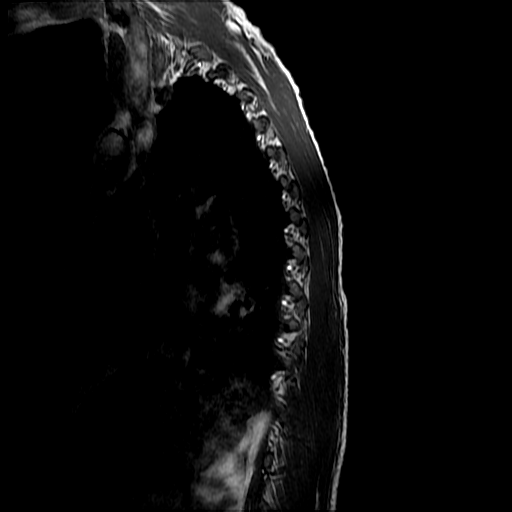

[Series 15: T2 · axial · 4.0mm · 0.43mm/px · z∈[-231,-57]mm · 9 of 39 slices shown (2 of 2)]
[im 1/39]
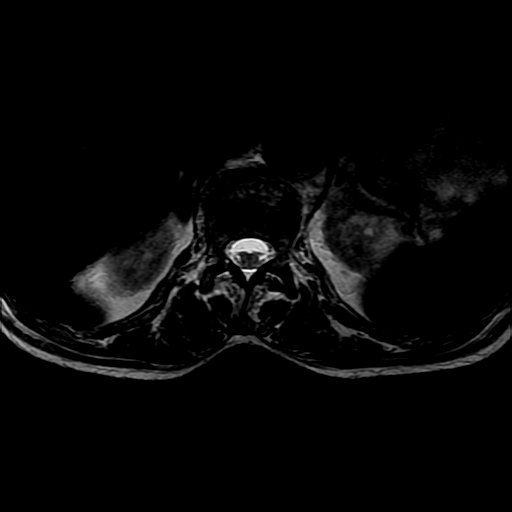
[im 6/39]
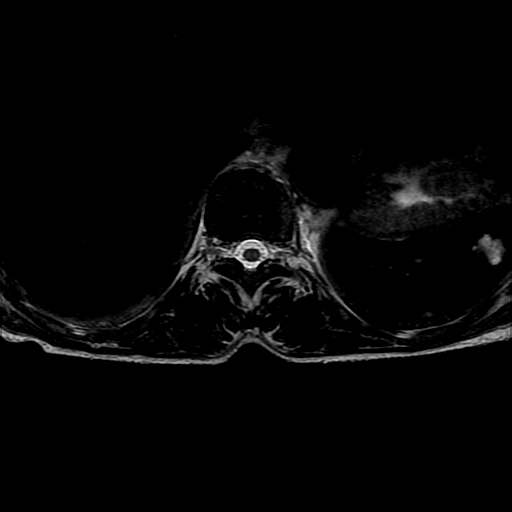
[im 11/39]
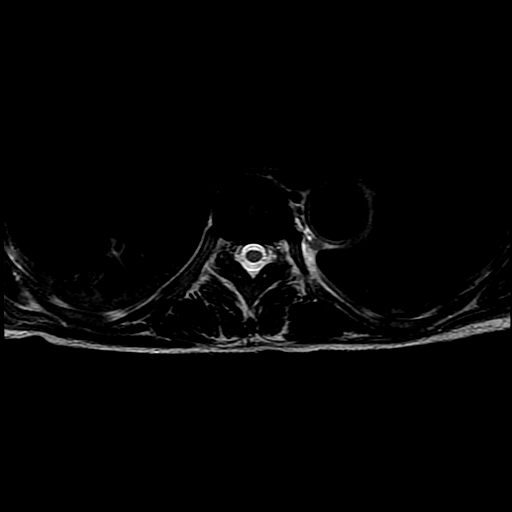
[im 17/39]
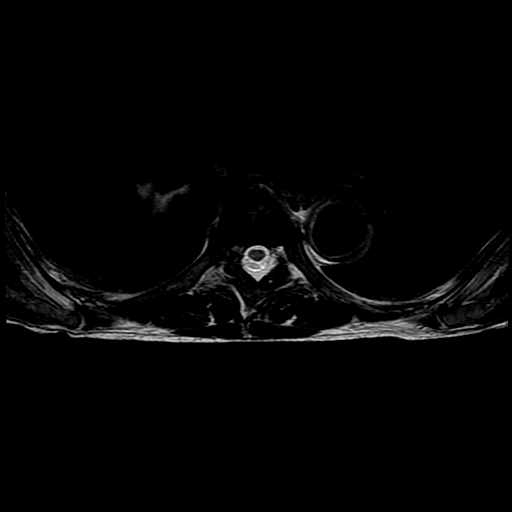
[im 20/39]
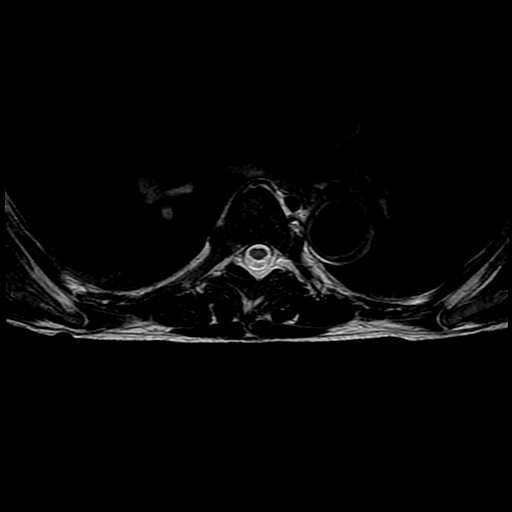
[im 22/39]
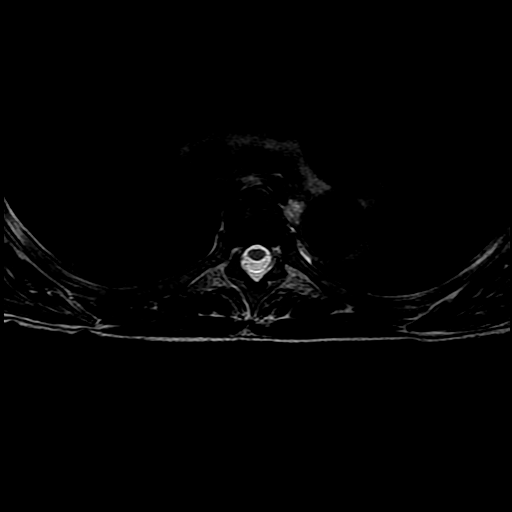
[im 28/39]
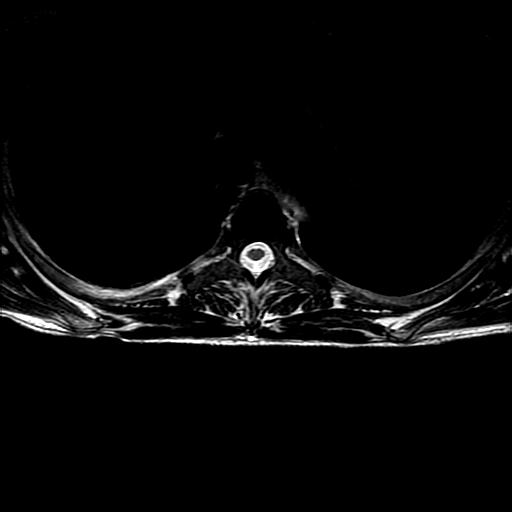
[im 33/39]
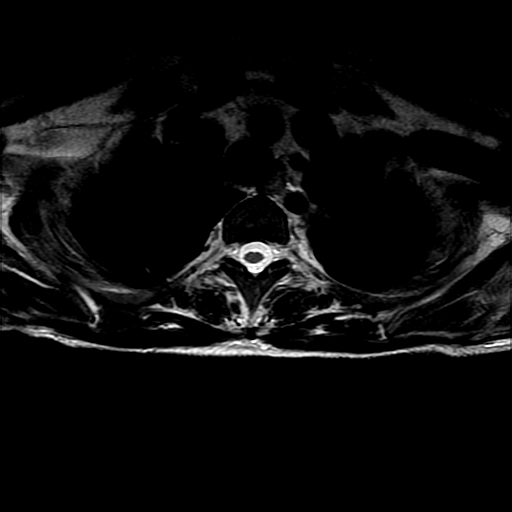
[im 39/39]
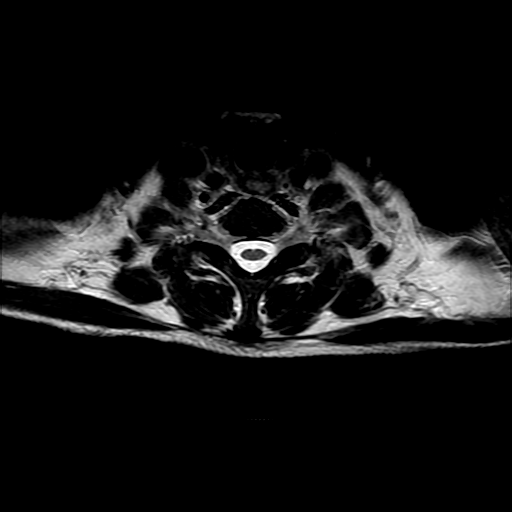

[18 of 48 positions shown; findings below may reference images not displayed]

FINDINGS: Motion artifact is present, greatest at lumbar levels.

MRI CERVICAL SPINE

Alignment: Trace anterolisthesis at C3-C4.

Vertebrae: Mild multilevel degenerative endplate irregularity. Mild,
likely degenerative endplate marrow edema at C3-C4.

Cord: No abnormal signal.

Posterior Fossa, vertebral arteries, paraspinal tissues: Parenchymal
volume loss. Probable small chronic infarct of the central pons.

Disc levels:

C2-C3:  No significant stenosis.

C3-C4: Anterolisthesis. Uncovered disc bulge with endplate
osteophytes. Mild canal and right foraminal stenosis. No left
foraminal stenosis.

C4-C5: Disc bulge with small left central disc protrusion. No canal
or foraminal stenosis.

C5-C6: Minimal disc bulge with endplate osteophytes. No canal or
foraminal stenosis.

C6-C7: Minimal disc bulge with endplate osteophytes. No canal or
foraminal stenosis.

C7-T1:  No stenosis.

MRI THORACIC SPINE

Alignment:  Preserved.

Vertebrae: Mild degenerative endplate irregularity. Schmorl's nodes
are present, for example inferior endplate T12 where there is mild
associated marrow edema.

Cord:  No abnormal signal.

Paraspinal and other soft tissues: 1.4 cm nodular lesion of the left
lower lobe.

Disc levels: Minor degenerative changes are present. There is no
canal or foraminal stenosis.

MRI LUMBAR SPINE

Segmentation:  Standard.

Alignment:  Preserved.

Vertebrae: Vertebral body heights are maintained. Mild marrow edema
associated with L4-L5 facet arthropathy.

Conus medullaris and cauda equina: Conus extends to the L1-L2 level.
Conus and cauda equina appear normal.

Paraspinal and other soft tissues: Unremarkable.

Disc levels: No canal or foraminal stenosis at any level. Lower
lumbar facet arthropathy is greatest at L4-L5 with there are joint
effusions.
IMPRESSION: Degraded by motion artifact, particularly at lumbar levels.

No evidence of discitis/osteomyelitis.

Degenerative changes without high-grade stenosis. Facet arthropathy
is greatest at L4-L5 where there are joint effusions; septic
arthritis is possible but is not considered likely.

1.4 cm left lower lobe nodular lesion.

## 2023-05-30 ENCOUNTER — Encounter (HOSPITAL_COMMUNITY)
Admission: RE | Disposition: A | Discharge status: Home / Self Care | Payer: Self-pay | Source: Home / Self Care | Attending: Nephrology

## 2023-05-30 ENCOUNTER — Ambulatory Visit (HOSPITAL_COMMUNITY)
Admission: RE | Admit: 2023-05-30 | Discharge: 2023-05-30 | Disposition: A | Discharge status: Home / Self Care | Payer: 59 | Attending: Nephrology | Admitting: Nephrology

## 2023-05-30 ENCOUNTER — Other Ambulatory Visit: Payer: Self-pay

## 2023-05-30 DIAGNOSIS — Z8673 Personal history of transient ischemic attack (TIA), and cerebral infarction without residual deficits: Secondary | ICD-10-CM | POA: Insufficient documentation

## 2023-05-30 DIAGNOSIS — T82858A Stenosis of vascular prosthetic devices, implants and grafts, initial encounter: Secondary | ICD-10-CM | POA: Diagnosis present

## 2023-05-30 DIAGNOSIS — F17210 Nicotine dependence, cigarettes, uncomplicated: Secondary | ICD-10-CM | POA: Diagnosis not present

## 2023-05-30 DIAGNOSIS — D631 Anemia in chronic kidney disease: Secondary | ICD-10-CM | POA: Diagnosis not present

## 2023-05-30 DIAGNOSIS — M3214 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: Secondary | ICD-10-CM | POA: Insufficient documentation

## 2023-05-30 DIAGNOSIS — N186 End stage renal disease: Secondary | ICD-10-CM | POA: Diagnosis not present

## 2023-05-30 DIAGNOSIS — G40909 Epilepsy, unspecified, not intractable, without status epilepticus: Secondary | ICD-10-CM | POA: Diagnosis not present

## 2023-05-30 DIAGNOSIS — Y832 Surgical operation with anastomosis, bypass or graft as the cause of abnormal reaction of the patient, or of later complication, without mention of misadventure at the time of the procedure: Secondary | ICD-10-CM | POA: Diagnosis not present

## 2023-05-30 DIAGNOSIS — Z992 Dependence on renal dialysis: Secondary | ICD-10-CM | POA: Diagnosis not present

## 2023-05-30 DIAGNOSIS — I120 Hypertensive chronic kidney disease with stage 5 chronic kidney disease or end stage renal disease: Secondary | ICD-10-CM | POA: Insufficient documentation

## 2023-05-30 HISTORY — PX: PERIPHERAL VASCULAR BALLOON ANGIOPLASTY: CATH118281

## 2023-05-30 HISTORY — PX: A/V FISTULAGRAM: CATH118298

## 2023-05-30 SURGERY — A/V FISTULAGRAM
Anesthesia: LOCAL

## 2023-05-30 MED ORDER — ACETAMINOPHEN 325 MG PO TABS: 650.0000 mg | ORAL_TABLET | ORAL | Status: DC | PRN

## 2023-05-30 MED ORDER — LIDOCAINE HCL (PF) 1 % IJ SOLN
INTRAMUSCULAR | Status: AC
  Filled 2023-05-30: qty 30

## 2023-05-30 MED ORDER — IODIXANOL 320 MG/ML IV SOLN
INTRAVENOUS | Status: DC | PRN
  Administered 2023-05-30: 10 mL via INTRAVENOUS

## 2023-05-30 MED ORDER — FENTANYL CITRATE (PF) 100 MCG/2ML IJ SOLN
INTRAMUSCULAR | Status: DC | PRN
  Administered 2023-05-30: 25 ug via INTRAVENOUS

## 2023-05-30 MED ORDER — SODIUM CHLORIDE 0.9% FLUSH: 3.0000 mL | INTRAVENOUS | Status: DC | PRN

## 2023-05-30 MED ORDER — MIDAZOLAM HCL 2 MG/2ML IJ SOLN
INTRAMUSCULAR | Status: AC
  Filled 2023-05-30: qty 2

## 2023-05-30 MED ORDER — SODIUM CHLORIDE 0.9 % IV SOLN: INTRAVENOUS | Status: DC

## 2023-05-30 MED ORDER — LIDOCAINE HCL (PF) 1 % IJ SOLN
INTRAMUSCULAR | Status: DC | PRN
  Administered 2023-05-30: 2 mL via SUBCUTANEOUS

## 2023-05-30 MED ORDER — FENTANYL CITRATE (PF) 100 MCG/2ML IJ SOLN
INTRAMUSCULAR | Status: AC
  Filled 2023-05-30: qty 2

## 2023-05-30 MED ORDER — HEPARIN (PORCINE) IN NACL 1000-0.9 UT/500ML-% IV SOLN
INTRAVENOUS | Status: DC | PRN
  Administered 2023-05-30: 500 mL

## 2023-05-30 SURGICAL SUPPLY — 8 items
BAG SNAP BAND KOVER 36X36 (MISCELLANEOUS) ×2 IMPLANT
BALLN ATHLETIS 8X40X75 (BALLOONS) ×2
BALLOON ATHLETIS 8X40X75 (BALLOONS) IMPLANT
COVER DOME SNAP 22 D (MISCELLANEOUS) ×2 IMPLANT
SHEATH PINNACLE R/O II 7F 4CM (SHEATH) IMPLANT
SYR MEDALLION 10ML (SYRINGE) IMPLANT
TRAY PV CATH (CUSTOM PROCEDURE TRAY) ×2 IMPLANT
WIRE BENTSON .035X145CM (WIRE) IMPLANT

## 2023-05-30 NOTE — H&P (Signed)
Kathleen [name] is an 63 y.o. female.   Chief Complaint: Decreased access flows HPI:  63 year old woman with past medical history significant for hypertension, elevated ANA/SLE, seizure disorder, prior history of TIA and end-stage renal disease on hemodialysis.  She has a left upper arm loop graft that was created on 11/27/2019 (arterial limb medial).  Her last procedure was 9 mm axillary vein/outflow brachial vein angioplasty performed in July 2024.  She denies any chest pain or shortness of breath and does not have any fevers or chills.  She denies any nausea, vomiting or diarrhea.  She denies any focal pain or discomfort within the graft itself.  The procedure was explained to her and she is willing to proceed, consent signed.  She unfortunately ate breakfast at about 830 this morning and the plan is to perform the procedure with narcotic analgesic (fentanyl) without sedation.  Past Medical History:  Diagnosis Date   Anemia    Arthritis    Cardiac arrest (HCC)    Cellulitis    Cerebral vasculitis    COPD (chronic obstructive pulmonary disease) (HCC)    Elevated antinuclear antibody (ANA) level    Endometriosis    ESRD (end stage renal disease) on dialysis (HCC)    Gallstones    Hypertension    Lupus (HCC)    Migraine    PONV (postoperative nausea and vomiting)    Seizures (HCC)    Sepsis (HCC)    Sleep apnea    Suicide attempt (HCC)    TB (tuberculosis)    Transient ischemic attack (TIA)    Vasculitis of skin     Past Surgical History:  Procedure Laterality Date   AV FISTULA PLACEMENT Left 07/04/2017   Procedure: ARTERIOVENOUS (AV) FISTULA CREATION;  Surgeon: Fransisco Hertz, MD;  Location: [facility] OR;  Service: Vascular;  Laterality: Left;   AV FISTULA PLACEMENT Left 11/27/2019   Procedure: INSERTION OF ARTERIOVENOUS (AV) GORE-TEX GRAFT LEFT UPPER ARM;  Surgeon: Chuck Hint, MD;  Location: [facility] OR;  Service: Vascular;  Laterality: Left;   AV FISTULA PLACEMENT Left  07/29/2020   Procedure: INSERTION OF LEFT UPPER ARM ARTERIOVENOUS (AV) GORE-TEX GRAFT;  Surgeon: Chuck Hint, MD;  Location: [facility] OR;  Service: Vascular;  Laterality: Left;   BIOPSY  12/23/2021   Procedure: BIOPSY;  Surgeon: Tressia Danas, MD;  Location: [facility] ENDOSCOPY;  Service: Gastroenterology;;   BUBBLE STUDY  05/25/2021   Procedure: BUBBLE STUDY;  Surgeon: Sande Rives, MD;  Location: [facility] ENDOSCOPY;  Service: Cardiovascular;;   CESAREAN SECTION     COLONOSCOPY WITH PROPOFOL N/A 12/23/2021   Procedure: COLONOSCOPY WITH PROPOFOL;  Surgeon: Tressia Danas, MD;  Location: [facility] ENDOSCOPY;  Service: Gastroenterology;  Laterality: N/A;   ESOPHAGOGASTRODUODENOSCOPY N/A 07/15/2016   Procedure: ESOPHAGOGASTRODUODENOSCOPY (EGD);  Surgeon: Carman Ching, MD;  Location: [facility] ENDOSCOPY;  Service: Endoscopy;  Laterality: N/A;   ESOPHAGOGASTRODUODENOSCOPY N/A 12/23/2021   Procedure: ESOPHAGOGASTRODUODENOSCOPY (EGD);  Surgeon: Tressia Danas, MD;  Location: [facility] ENDOSCOPY;  Service: Gastroenterology;  Laterality: N/A;   I & D EXTREMITY Left 05/27/2021   Procedure: IRRIGATION AND DEBRIDEMENT OF WRIST;  Surgeon: Bradly Bienenstock, MD;  Location: [facility] OR;  Service: Orthopedics;  Laterality: Left;   INSERTION OF DIALYSIS CATHETER Right 07/04/2017   Procedure: INSERTION OF TUNNELED  DIALYSIS CATHETER - RIGHT INTERNAL JUGULAR PLACEMENT;  Surgeon: Fransisco Hertz, MD;  Location: [facility] OR;  Service: Vascular;  Laterality: Right;   IR FLUORO GUIDE CV LINE RIGHT  09/08/2021   IR REMOVAL TUN  CV CATH W/O FL  12/14/2022   IR THROMBECTOMY AV FISTULA W/THROMBOLYSIS/PTA INC/SHUNT/IMG LEFT Left 09/10/2021   IR THROMBECTOMY AV FISTULA W/THROMBOLYSIS/PTA INC/SHUNT/IMG LEFT Left 12/03/2022   IR US GUIDE VASC ACCESS LEFT  09/10/2021   IR US GUIDE VASC ACCESS LEFT  12/03/2022   IR US GUIDE VASC ACCESS RIGHT  09/08/2021   LUNG BIOPSY     POLYPECTOMY  12/23/2021   Procedure: POLYPECTOMY;  Surgeon: Tressia Danas, MD;  Location: [facility]  ENDOSCOPY;  Service: Gastroenterology;;   TEE WITHOUT CARDIOVERSION N/A 05/25/2021   Procedure: TRANSESOPHAGEAL ECHOCARDIOGRAM (TEE);  Surgeon: Sande Rives, MD;  Location: [facility] ENDOSCOPY;  Service: Cardiovascular;  Laterality: N/A;   TUBAL LIGATION      Family History  Problem Relation Age of Onset   Ovarian cancer Mother    Breast cancer Sister    Throat cancer Brother    Social History:  reports that she has been smoking cigarettes. She has never used smokeless tobacco. She reports that she does not drink alcohol and does not use drugs.  Allergies: No Known Allergies  Medications Prior to Admission  Medication Sig Dispense Refill   acetaminophen (TYLENOL) 160 MG/5ML liquid Take 320 mg by mouth every 6 (six) hours as needed for fever or pain.     cinacalcet (SENSIPAR) 30 MG tablet Take 2 tablets (60 mg total) by mouth every Tuesday, Thursday, and Saturday at 6 PM. 60 tablet 0   ferrous sulfate 325 (65 FE) MG tablet Take 325 mg by mouth daily.     multivitamin (RENA-VIT) TABS tablet Take 1 tablet by mouth daily.     pantoprazole (PROTONIX) 40 MG tablet Take 1 tablet (40 mg total) by mouth 2 (two) times daily. 60 tablet 0   sevelamer carbonate (RENVELA) 800 MG tablet Take 800-2,400 mg by mouth See admin instructions. 2400 mg three times daily with meals, 800 mg with snacks.      No results found for this or any previous visit (from the past 48 hours). No results found.  Review of Systems  All other systems reviewed and are negative.   There were no vitals taken for this visit. Physical Exam Vitals and nursing note reviewed.  Constitutional:      Appearance: Normal appearance. She is normal weight.  HENT:     Head: Normocephalic and atraumatic.     Right Ear: External ear normal.     Left Ear: External ear normal.     Nose: Nose normal.     Mouth/Throat:     Mouth: Mucous membranes are dry.     Pharynx: Oropharynx is clear.  Eyes:     Extraocular Movements:  Extraocular movements intact.     Conjunctiva/sclera: Conjunctivae normal.  Cardiovascular:     Rate and Rhythm: Normal rate and regular rhythm.     Pulses: Normal pulses.     Heart sounds: Normal heart sounds.  Pulmonary:     Effort: Pulmonary effort is normal.     Breath sounds: Normal breath sounds.  Abdominal:     General: Abdomen is flat. Bowel sounds are normal.     Palpations: Abdomen is soft.  Musculoskeletal:     Cervical back: Normal range of motion and neck supple.     Right lower leg: No edema.     Left lower leg: No edema.     Comments: Left upper arm arteriovenous loop graft with pseudoaneurysm and arterial limb and hyper pulsatile venous limb  Skin:    General: Skin  is warm and dry.  Neurological:     General: No focal deficit present.     Mental Status: She is alert and oriented to person, place, and time.      Assessment/Plan 1.  Decreased access flows: Will perform shuntogram/angiogram to evaluate for etiology and offer management.  Procedure explained to patient and she is willing to proceed while acknowledging risks. 2.  End-stage renal disease: Continue hemodialysis on TTS schedule following procedure today. 3.  Hypertension: Blood pressures currently within acceptable range, will monitor with ongoing moderate sedation during the procedure. 4.  Anemia of chronic disease: Resume ESA with HD.  Dagoberto Ligas, MD 05/30/2023, 12:34 PM

## 2023-05-30 NOTE — Discharge Instructions (Signed)

## 2023-05-30 NOTE — Op Note (Signed)
Patient presents with decreased access flows and prolonged cannulation site bleeding from her left upper arm loop graft placed in July 2021. Her last procedure here was in 9 mm axillary vein angioplasty in July 2024. 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   Summary:  1) Successful angiogram of a left upper arm loop AVG with evidence of a 80% axillary vein stenosis treated to 10% with a 9 mm Athletis angioplasty balloon FE ~18 atm.   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  Reflux arteriogram showed that the arterial anastomosis and inflow limb of the graft were widely patent. 3) The left upper arm loop graft remains amenable to future percutaneous intervention.   Description of procedure: The left upper arm was prepped and draped in the usual fashion. The left upper arm loop AVG was cannulated ([id number]) in the venous limb of the graft in an antegrade direction with an 18G Angiocath needle and then a 6 Fr sheath was inserted by guidewire exchange technique. The angiogram revealed a patent body of the graft with a patent venous anastomosis and an 80% stenosis in the outflow axillary vein. The venous limb of the graft and left side central veins are patent.  Reflux arteriogram showed a patent arterial anastomosis and inflow graft segment with aneurysm in the arterial limb.  A guidewire was easily advanced past the outflow stenosis and parked in the central veins. I then advanced a 9 x 4 Athletis angioplasty balloon through the antegrade sheath over the guidewire to the level of the axillary vein stenosis. Venous angioplasty was performed to 100% balloon effacement with approximately 18 ATM of pressure via a hand syringe assembly. Completion angiogram revealed no evidence of extravasation or dissection, more rapid  access flows through the graft and 10% residual stenosis in the axillary vein stenosis.  Hemostasis: A 3-0 ethilon purse string suture was placed at the cannulation site on removal of the sheath.  Sedation: Fentanyl.  Sedation time: 10 minutes  Contrast. 10 mL  Monitoring: Because of the patient's comorbid conditions and sedation during the procedure, continuous EKG monitoring and O2 saturation monitoring was performed throughout the procedure by the RN. There were no abnormal arrhythmias encountered.  Complications: None.   Diagnoses: I87.1 Stricture of vein  N18.6 ESRD T82.858A Stricture of access  Procedure Coding:  [phone] Cannulation and angiogram of fistula, venous angioplasty (axillary vein) O9629 Contrast  Recommendations:  1. Continue to cannulate the fistula with 15G needles.  2. Refer back for problems with flows. 3. Remove the suture next treatment.   Discharge: The patient was discharged home in stable condition. The patient was given education regarding the care of the dialysis access AVG and specific instructions in case of any problems.

## 2023-05-31 ENCOUNTER — Encounter (HOSPITAL_COMMUNITY): Payer: Self-pay | Admitting: Nephrology

## 2023-07-19 ENCOUNTER — Other Ambulatory Visit (HOSPITAL_COMMUNITY): Payer: Self-pay

## 2023-07-20 ENCOUNTER — Other Ambulatory Visit (HOSPITAL_COMMUNITY): Payer: Self-pay

## 2023-07-29 ENCOUNTER — Other Ambulatory Visit: Payer: Self-pay | Admitting: Nurse Practitioner

## 2023-07-29 DIAGNOSIS — Z1231 Encounter for screening mammogram for malignant neoplasm of breast: Secondary | ICD-10-CM

## 2023-08-18 IMAGING — DX DG CHEST 1V PORT
1 series · 1 of 1 positions shown · non-contrast
Comparison: 07/04/2021

CLINICAL DATA: Shortness of breath

EXAM:
PORTABLE CHEST 1 VIEW

[chest]
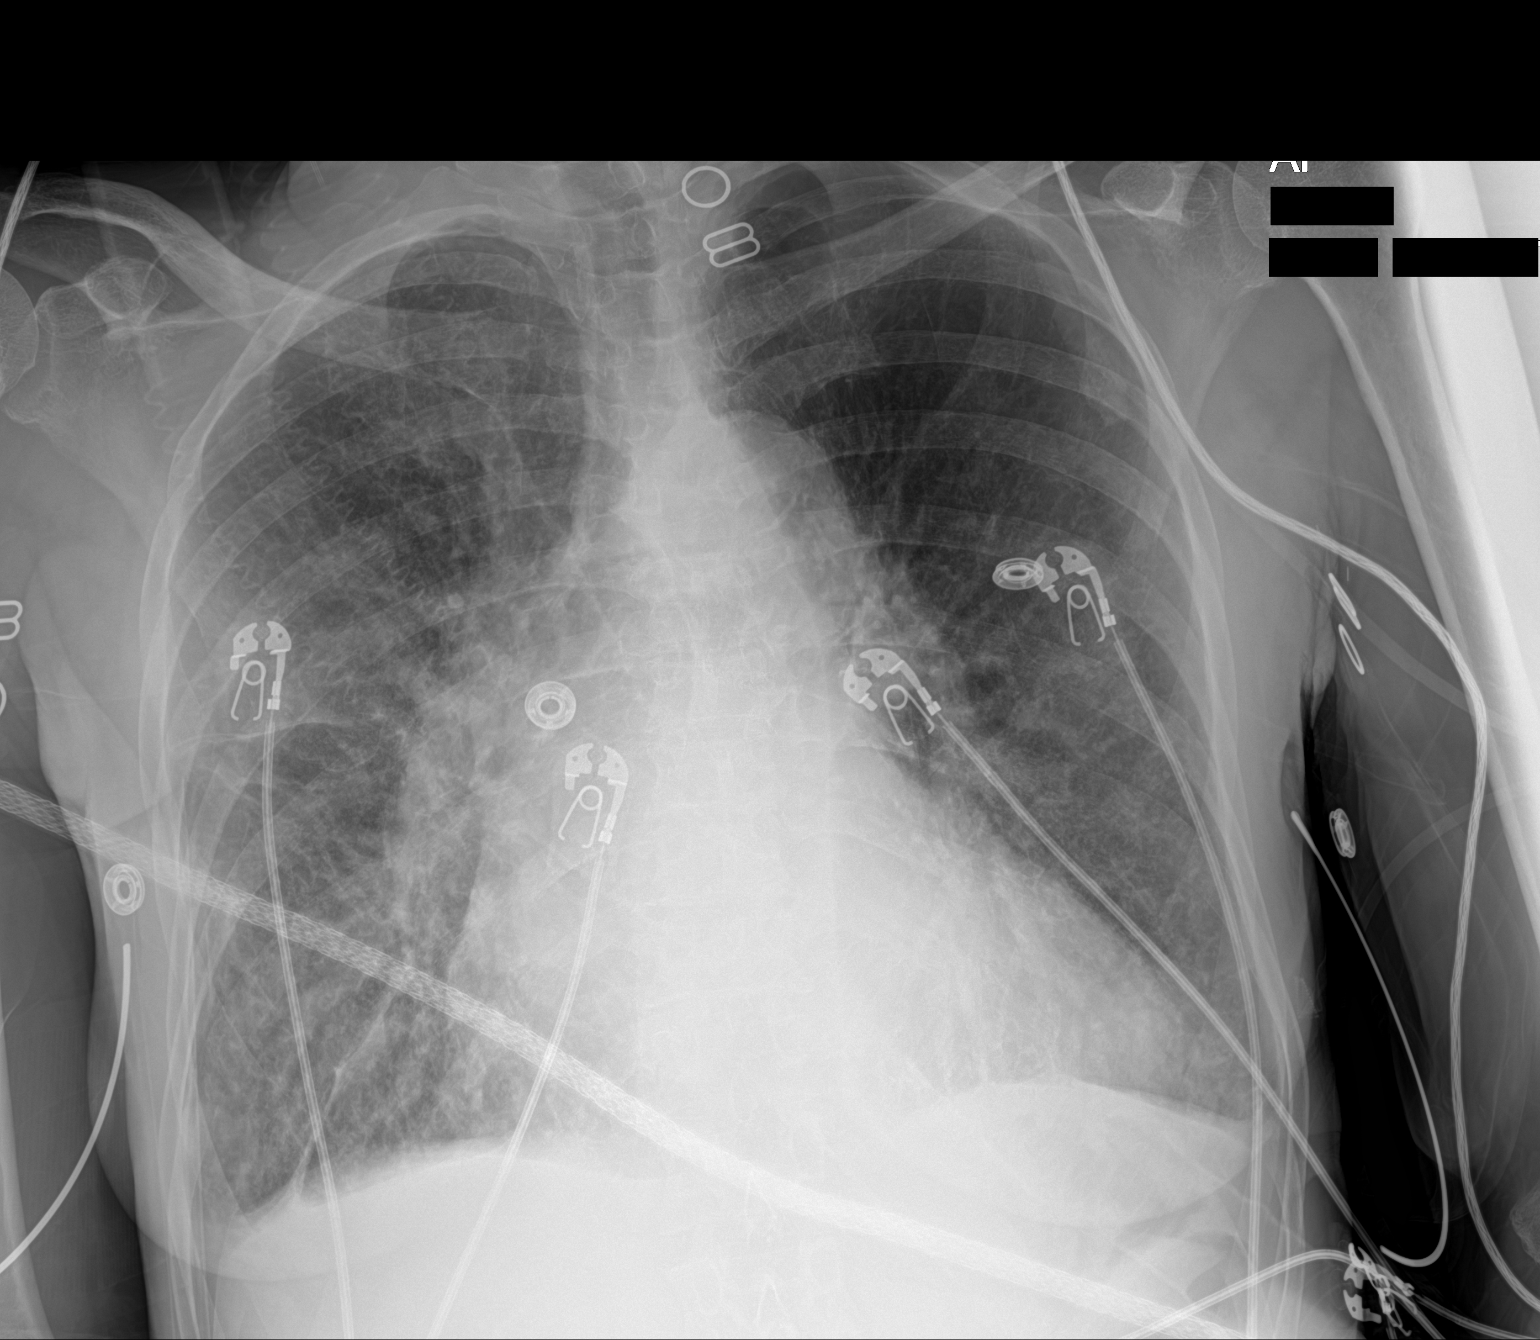

[1 of 1 positions shown; findings below may reference images not displayed]

FINDINGS: Mild cardiomegaly. Mild right greater than left interstitial
opacity. No pneumothorax or sizable pleural effusion. No focal
airspace consolidation.
IMPRESSION: Mild cardiomegaly and mild right greater than left interstitial
opacity, which may indicate mild pulmonary edema.

## 2023-08-19 ENCOUNTER — Ambulatory Visit

## 2023-08-26 ENCOUNTER — Ambulatory Visit

## 2023-10-05 ENCOUNTER — Encounter (HOSPITAL_COMMUNITY): Admission: RE | Payer: Self-pay | Source: Home / Self Care

## 2023-10-05 ENCOUNTER — Ambulatory Visit (HOSPITAL_COMMUNITY): Admission: RE | Admit: 2023-10-05 | Source: Home / Self Care | Admitting: Vascular Surgery

## 2023-10-05 DIAGNOSIS — N186 End stage renal disease: Secondary | ICD-10-CM

## 2023-10-05 SURGERY — A/V SHUNT INTERVENTION
Anesthesia: LOCAL

## 2023-10-10 ENCOUNTER — Ambulatory Visit (HOSPITAL_COMMUNITY): Admission: RE | Admit: 2023-10-10 | Source: Home / Self Care | Admitting: Vascular Surgery

## 2023-10-10 ENCOUNTER — Encounter (HOSPITAL_COMMUNITY): Admission: RE | Payer: Self-pay | Source: Home / Self Care

## 2023-10-10 SURGERY — A/V SHUNT INTERVENTION
Anesthesia: LOCAL

## 2023-10-19 ENCOUNTER — Encounter (HOSPITAL_COMMUNITY)
Admission: RE | Disposition: A | Discharge status: Home / Self Care | Payer: Self-pay | Source: Home / Self Care | Attending: Vascular Surgery

## 2023-10-19 ENCOUNTER — Ambulatory Visit (HOSPITAL_COMMUNITY)
Admission: RE | Admit: 2023-10-19 | Discharge: 2023-10-19 | Disposition: A | Discharge status: Home / Self Care | Attending: Vascular Surgery | Admitting: Vascular Surgery

## 2023-10-19 ENCOUNTER — Other Ambulatory Visit: Payer: Self-pay

## 2023-10-19 DIAGNOSIS — T82858A Stenosis of vascular prosthetic devices, implants and grafts, initial encounter: Secondary | ICD-10-CM | POA: Diagnosis present

## 2023-10-19 DIAGNOSIS — N186 End stage renal disease: Secondary | ICD-10-CM

## 2023-10-19 DIAGNOSIS — Y832 Surgical operation with anastomosis, bypass or graft as the cause of abnormal reaction of the patient, or of later complication, without mention of misadventure at the time of the procedure: Secondary | ICD-10-CM | POA: Insufficient documentation

## 2023-10-19 DIAGNOSIS — T82510A Breakdown (mechanical) of surgically created arteriovenous fistula, initial encounter: Secondary | ICD-10-CM | POA: Insufficient documentation

## 2023-10-19 DIAGNOSIS — T82898A Other specified complication of vascular prosthetic devices, implants and grafts, initial encounter: Secondary | ICD-10-CM

## 2023-10-19 DIAGNOSIS — T82838A Hemorrhage of vascular prosthetic devices, implants and grafts, initial encounter: Secondary | ICD-10-CM | POA: Diagnosis not present

## 2023-10-19 DIAGNOSIS — Z992 Dependence on renal dialysis: Secondary | ICD-10-CM | POA: Diagnosis not present

## 2023-10-19 DIAGNOSIS — I120 Hypertensive chronic kidney disease with stage 5 chronic kidney disease or end stage renal disease: Secondary | ICD-10-CM | POA: Diagnosis not present

## 2023-10-19 DIAGNOSIS — F17210 Nicotine dependence, cigarettes, uncomplicated: Secondary | ICD-10-CM | POA: Insufficient documentation

## 2023-10-19 HISTORY — PX: A/V SHUNT INTERVENTION: CATH118220

## 2023-10-19 SURGERY — A/V SHUNT INTERVENTION
Anesthesia: LOCAL

## 2023-10-19 MED ORDER — HEPARIN (PORCINE) IN NACL 1000-0.9 UT/500ML-% IV SOLN
INTRAVENOUS | Status: DC | PRN
  Administered 2023-10-19 (×2): 500 mL

## 2023-10-19 MED ORDER — IODIXANOL 320 MG/ML IV SOLN
INTRAVENOUS | Status: DC | PRN
  Administered 2023-10-19: 45 mL

## 2023-10-19 MED ORDER — LIDOCAINE HCL (PF) 1 % IJ SOLN
INTRAMUSCULAR | Status: DC | PRN
  Administered 2023-10-19: 5 mL via INTRADERMAL

## 2023-10-19 MED ORDER — LIDOCAINE HCL (PF) 1 % IJ SOLN
INTRAMUSCULAR | Status: AC
  Filled 2023-10-19: qty 30

## 2023-10-19 SURGICAL SUPPLY — 11 items
BALLOON MUSTANG 7.0X40 75 (BALLOONS) IMPLANT
BALLOON MUSTANG 9X40X75 (BALLOONS) IMPLANT
GLIDEWIRE ADV .035X180CM (WIRE) IMPLANT
KIT ENCORE 26 ADVANTAGE (KITS) IMPLANT
KIT MICROPUNCTURE NIT STIFF (SHEATH) IMPLANT
KIT PV (KITS) ×1 IMPLANT
SHEATH PINNACLE 8F 10CM (SHEATH) IMPLANT
STENT VIABAHN 8X100X75 (Permanent Stent) IMPLANT
TRAY PV CATH (CUSTOM PROCEDURE TRAY) ×1 IMPLANT
TUBING CIL FLEX 10 FLL-RA (TUBING) IMPLANT
WIRE BENTSON .035X145CM (WIRE) IMPLANT

## 2023-10-19 NOTE — H&P (Signed)
 HD ACCESS CENTER H&P   Patient ID: Kathleen [name], female   DOB: 1960/11/24, 63 y.o.   MRN: [id number]  Subjective:     HPI Kathleen [name] is a 63 y.o. female with ESRD presenting to the HD access center for intervention.  Past Medical History:  Diagnosis Date   Anemia    Arthritis    Cardiac arrest (HCC)    Cellulitis    Cerebral vasculitis    COPD (chronic obstructive pulmonary disease) (HCC)    Elevated antinuclear antibody (ANA) level    Endometriosis    ESRD (end stage renal disease) on dialysis (HCC)    Gallstones    Hypertension    Lupus    Migraine    PONV (postoperative nausea and vomiting)    Seizures (HCC)    Sepsis (HCC)    Sleep apnea    Suicide attempt (HCC)    TB (tuberculosis)    Transient ischemic attack (TIA)    Vasculitis of skin    Family History  Problem Relation Age of Onset   Ovarian cancer Mother    Breast cancer Sister    Throat cancer Brother    Past Surgical History:  Procedure Laterality Date   A/V FISTULAGRAM N/A 05/30/2023   Procedure: A/V Fistulagram;  Surgeon: Melodie Spry, MD;  Location: [facility] INVASIVE CV LAB;  Service: Cardiovascular;  Laterality: N/A;   AV FISTULA PLACEMENT Left 07/04/2017   Procedure: ARTERIOVENOUS (AV) FISTULA CREATION;  Surgeon: Arvil Lauber, MD;  Location: [facility] OR;  Service: Vascular;  Laterality: Left;   AV FISTULA PLACEMENT Left 11/27/2019   Procedure: INSERTION OF ARTERIOVENOUS (AV) GORE-TEX GRAFT LEFT UPPER ARM;  Surgeon: Dannis Dy, MD;  Location: [facility] OR;  Service: Vascular;  Laterality: Left;   AV FISTULA PLACEMENT Left 07/29/2020   Procedure: INSERTION OF LEFT UPPER ARM ARTERIOVENOUS (AV) GORE-TEX GRAFT;  Surgeon: Dannis Dy, MD;  Location: [facility] OR;  Service: Vascular;  Laterality: Left;   BIOPSY  12/23/2021   Procedure: BIOPSY;  Surgeon: Lindle Rhea, MD;  Location: [facility] ENDOSCOPY;  Service: Gastroenterology;;   BUBBLE STUDY  05/25/2021   Procedure: BUBBLE STUDY;  Surgeon:  Harrold Lincoln, MD;  Location: [facility] ENDOSCOPY;  Service: Cardiovascular;;   CESAREAN SECTION     COLONOSCOPY WITH PROPOFOL  N/A 12/23/2021   Procedure: COLONOSCOPY WITH PROPOFOL ;  Surgeon: Lindle Rhea, MD;  Location: [facility] ENDOSCOPY;  Service: Gastroenterology;  Laterality: N/A;   ESOPHAGOGASTRODUODENOSCOPY N/A 07/15/2016   Procedure: ESOPHAGOGASTRODUODENOSCOPY (EGD);  Surgeon: Jolinda Necessary, MD;  Location: [facility] ENDOSCOPY;  Service: Endoscopy;  Laterality: N/A;   ESOPHAGOGASTRODUODENOSCOPY N/A 12/23/2021   Procedure: ESOPHAGOGASTRODUODENOSCOPY (EGD);  Surgeon: Lindle Rhea, MD;  Location: [facility] ENDOSCOPY;  Service: Gastroenterology;  Laterality: N/A;   I & D EXTREMITY Left 05/27/2021   Procedure: IRRIGATION AND DEBRIDEMENT OF WRIST;  Surgeon: Arvil Birks, MD;  Location: [facility] OR;  Service: Orthopedics;  Laterality: Left;   INSERTION OF DIALYSIS CATHETER Right 07/04/2017   Procedure: INSERTION OF TUNNELED  DIALYSIS CATHETER - RIGHT INTERNAL JUGULAR PLACEMENT;  Surgeon: Arvil Lauber, MD;  Location: [facility] OR;  Service: Vascular;  Laterality: Right;   IR FLUORO GUIDE CV LINE RIGHT  09/08/2021   IR REMOVAL TUN CV CATH W/O FL  12/14/2022   IR THROMBECTOMY AV FISTULA W/THROMBOLYSIS/PTA INC/SHUNT/IMG LEFT Left 09/10/2021   IR THROMBECTOMY AV FISTULA W/THROMBOLYSIS/PTA INC/SHUNT/IMG LEFT Left 12/03/2022   IR US  GUIDE VASC ACCESS LEFT  09/10/2021   IR US  GUIDE VASC ACCESS LEFT  12/03/2022  IR US  GUIDE VASC ACCESS RIGHT  09/08/2021   LUNG BIOPSY     PERIPHERAL VASCULAR BALLOON ANGIOPLASTY  05/30/2023   Procedure: PERIPHERAL VASCULAR BALLOON ANGIOPLASTY;  Surgeon: Melodie Spry, MD;  Location: [facility] INVASIVE CV LAB;  Service: Cardiovascular;;  Axillary Vein   POLYPECTOMY  12/23/2021   Procedure: POLYPECTOMY;  Surgeon: Lindle Rhea, MD;  Location: [facility] ENDOSCOPY;  Service: Gastroenterology;;   TEE WITHOUT CARDIOVERSION N/A 05/25/2021   Procedure: TRANSESOPHAGEAL ECHOCARDIOGRAM (TEE);  Surgeon: Harrold Lincoln, MD;   Location: [facility] ENDOSCOPY;  Service: Cardiovascular;  Laterality: N/A;   TUBAL LIGATION      Short Social History:  Social History   Tobacco Use   Smoking status: Some Days    Current packs/day: 0.25    Types: Cigarettes   Smokeless tobacco: Never  Substance Use Topics   Alcohol use: No    No Known Allergies  No current facility-administered medications for this encounter.    REVIEW OF SYSTEMS All other systems were reviewed and are negative     Objective:   Objective   Vitals:   10/19/23 1113 10/19/23 1122  BP: (!) 114/90 114/82  Pulse: 87 86  Resp: 12 12  Temp: 98.2 F (36.8 C)   TempSrc: Oral   SpO2: 100% 100%   There is no height or weight on file to calculate BMI.  Physical Exam General: no acute distress Cardiac: hemodynamically stable Extremities: Palpable thrill in left axillary loop graft, pseudoaneurysm present moderately sized, no skin breakdown or signs of recent bleeding.  Data: Reviewed fistulogram by Dr. Lydia Sams in January.  The axillary vein stenosis treated with 9 mm Athletis balloon     Assessment/Plan:   Kathleen [name] is a 63 y.o. female with ESRD presenting for fistulogram.  Having issues with pseudoaneurysm formation in left axillary loop graft. Last HD session yesterday. Reviewed risks and benefits of fistulogram with intervention and patient agreed to proceed.   Delaney Fearing, MD [facility]

## 2023-10-19 NOTE — Op Note (Signed)
    Patient name: Kathleen [name] MRN: [id number] DOB: 1961/02/28 Sex: female  10/19/2023 Pre-operative Diagnosis: ESRD Post-operative diagnosis:  Same Surgeon:  Gareld June Procedure Performed:  1.  Ultrasound-guided access, left upper arm loop graft  2.  Shuntogram  3.  Left subclavian/axillary venoplasty (central)  4.  Left dialysis graft stent (peripheral)    Indications: This is a 63 year old female with a left upper arm loop graft that is having bleeding after dialysis as well as a large pseudoaneurysm on the medial aspect of her graft  Procedure:  The patient was identified in the holding area and taken to room 8.  The patient was then placed supine on the table and prepped and draped in the usual sterile fashion.  A time out was called.  Ultrasound was used to evaluate the graft.  The graft was patent and compressible.  A digital ultrasound image was acquired.  The fistula was then accessed under ultrasound guidance using a micropuncture needle.  An 018 wire was then asvanced without resistance and a micropuncture sheath was placed.  Contrast injections were then performed through the sheath.  Findings: No evidence of central venous stenosis.  The arteriovenous anastomosis was widely patent.  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  There was a 80% stenosis at the peripheral/central vein junction   Intervention: After the above images were acquired the decision was made to proceed with intervention.  A Glidewire advantage was advanced into the central venous system and an 8 [place] sheath was placed.  I first selected a 7 x 40 balloon and performed balloon venoplasty of the subclavian/axillary vein.  I then upsized to a 9 mm balloon.  Following this we had a good result with less than 10% stenosis.  Attention was then turned towards the pseudoaneurysm in the medial portion of the graft.  This was treated by covering it with an 8 x 10 Viabahn and postdilated  with a 9 mm balloon taken to sub-- nominal pressure.  Completion imaging revealed resolution of the pseudoaneurysm.  Sheaths and wires were removed and a Monocryl was used for closure  Impression:  #1  Successful venoplasty of a central/peripheral stenosis using a 9 mm balloon   #2  Successful treatment of medial graft pseudoaneurysm with 8 x 10 Viabahn  #3  Access remains amenable to future interventions    V. Gareld June, M.D., [facility] [place] Office: [phone] Pager:  [phone]

## 2023-10-20 ENCOUNTER — Encounter (HOSPITAL_COMMUNITY): Payer: Self-pay | Admitting: Surgery

## 2023-10-21 ENCOUNTER — Encounter (HOSPITAL_COMMUNITY): Payer: Self-pay | Admitting: Surgery

## 2024-01-17 ENCOUNTER — Other Ambulatory Visit (HOSPITAL_COMMUNITY): Payer: Self-pay

## 2024-03-29 ENCOUNTER — Other Ambulatory Visit: Payer: Self-pay | Admitting: Nurse Practitioner

## 2024-03-29 DIAGNOSIS — Z1231 Encounter for screening mammogram for malignant neoplasm of breast: Secondary | ICD-10-CM
